# Patient Record
Sex: Female | Born: 1960 | Race: White | Hispanic: No | Smoking: Former smoker
Health system: Southern US, Community
[De-identification: ages and names within clinical notes are randomized; demographics above are authoritative.]

## PROBLEM LIST (undated history)

## (undated) DIAGNOSIS — Z8669 Personal history of other diseases of the nervous system and sense organs: Secondary | ICD-10-CM

## (undated) DIAGNOSIS — I10 Essential (primary) hypertension: Secondary | ICD-10-CM

## (undated) DIAGNOSIS — E559 Vitamin D deficiency, unspecified: Secondary | ICD-10-CM

## (undated) DIAGNOSIS — J4520 Mild intermittent asthma, uncomplicated: Secondary | ICD-10-CM

## (undated) DIAGNOSIS — K219 Gastro-esophageal reflux disease without esophagitis: Secondary | ICD-10-CM

## (undated) DIAGNOSIS — D649 Anemia, unspecified: Secondary | ICD-10-CM

## (undated) DIAGNOSIS — E785 Hyperlipidemia, unspecified: Secondary | ICD-10-CM

## (undated) HISTORY — DX: Personal history of other diseases of the nervous system and sense organs: Z86.69

## (undated) HISTORY — DX: Mild intermittent asthma, uncomplicated: J45.20

## (undated) HISTORY — DX: Vitamin D deficiency, unspecified: E55.9

## (undated) HISTORY — DX: Anemia, unspecified: D64.9

## (undated) HISTORY — DX: Hyperlipidemia, unspecified: E78.5

## (undated) HISTORY — PX: TUBAL LIGATION: SHX77

## (undated) HISTORY — PX: REDUCTION MAMMAPLASTY: SUR839

## (undated) HISTORY — DX: Gastro-esophageal reflux disease without esophagitis: K21.9

## (undated) HISTORY — PX: BREAST SURGERY: SHX581

---

## 2000-02-15 ENCOUNTER — Other Ambulatory Visit: Admission: RE | Admit: 2000-02-15 | Discharge: 2000-02-15 | Payer: Self-pay | Admitting: *Deleted

## 2001-06-15 ENCOUNTER — Other Ambulatory Visit: Admission: RE | Admit: 2001-06-15 | Discharge: 2001-06-15 | Payer: Self-pay | Admitting: Obstetrics & Gynecology

## 2001-11-14 ENCOUNTER — Ambulatory Visit (HOSPITAL_COMMUNITY): Admission: RE | Admit: 2001-11-14 | Discharge: 2001-11-14 | Payer: Self-pay | Admitting: Obstetrics and Gynecology

## 2001-11-14 ENCOUNTER — Encounter: Payer: Self-pay | Admitting: Obstetrics and Gynecology

## 2001-11-16 ENCOUNTER — Encounter: Admission: RE | Admit: 2001-11-16 | Discharge: 2001-11-16 | Payer: Self-pay | Admitting: Obstetrics and Gynecology

## 2002-01-22 ENCOUNTER — Inpatient Hospital Stay (HOSPITAL_COMMUNITY): Admission: AD | Admit: 2002-01-22 | Discharge: 2002-01-25 | Payer: Self-pay | Admitting: Obstetrics & Gynecology

## 2002-03-27 ENCOUNTER — Ambulatory Visit (HOSPITAL_COMMUNITY): Admission: RE | Admit: 2002-03-27 | Discharge: 2002-03-27 | Payer: Self-pay | Admitting: Obstetrics and Gynecology

## 2004-04-19 ENCOUNTER — Other Ambulatory Visit: Admission: RE | Admit: 2004-04-19 | Discharge: 2004-04-19 | Payer: Self-pay | Admitting: Internal Medicine

## 2004-04-23 ENCOUNTER — Ambulatory Visit (HOSPITAL_COMMUNITY): Admission: RE | Admit: 2004-04-23 | Discharge: 2004-04-23 | Payer: Self-pay | Admitting: Internal Medicine

## 2004-12-11 ENCOUNTER — Emergency Department (HOSPITAL_COMMUNITY): Admission: EM | Admit: 2004-12-11 | Discharge: 2004-12-11 | Payer: Self-pay | Admitting: Family Medicine

## 2004-12-28 ENCOUNTER — Emergency Department (HOSPITAL_COMMUNITY): Admission: EM | Admit: 2004-12-28 | Discharge: 2004-12-28 | Payer: Self-pay | Admitting: Family Medicine

## 2005-05-12 ENCOUNTER — Ambulatory Visit (HOSPITAL_COMMUNITY): Admission: RE | Admit: 2005-05-12 | Discharge: 2005-05-12 | Payer: Self-pay | Admitting: Internal Medicine

## 2005-09-29 ENCOUNTER — Encounter (INDEPENDENT_AMBULATORY_CARE_PROVIDER_SITE_OTHER): Payer: Self-pay | Admitting: Specialist

## 2005-09-29 ENCOUNTER — Ambulatory Visit (HOSPITAL_BASED_OUTPATIENT_CLINIC_OR_DEPARTMENT_OTHER): Admission: RE | Admit: 2005-09-29 | Discharge: 2005-09-29 | Payer: Self-pay | Admitting: *Deleted

## 2005-09-29 ENCOUNTER — Ambulatory Visit (HOSPITAL_COMMUNITY): Admission: RE | Admit: 2005-09-29 | Discharge: 2005-09-29 | Payer: Self-pay | Admitting: *Deleted

## 2006-05-02 ENCOUNTER — Other Ambulatory Visit: Admission: RE | Admit: 2006-05-02 | Discharge: 2006-05-02 | Payer: Self-pay | Admitting: Family Medicine

## 2006-05-18 ENCOUNTER — Ambulatory Visit (HOSPITAL_COMMUNITY): Admission: RE | Admit: 2006-05-18 | Discharge: 2006-05-18 | Payer: Self-pay | Admitting: Internal Medicine

## 2007-05-29 ENCOUNTER — Ambulatory Visit (HOSPITAL_COMMUNITY): Admission: RE | Admit: 2007-05-29 | Discharge: 2007-05-29 | Payer: Self-pay | Admitting: Internal Medicine

## 2007-05-29 LAB — HM MAMMOGRAPHY: HM Mammogram: NORMAL

## 2011-03-18 NOTE — H&P (Signed)
NAMECARNELL, Rebecca Ochoa                ACCOUNT NO.:  [id number]   MEDICAL RECORD NO.:  [id number]          PATIENT TYPE:  AMB   LOCATION:  [facility]                         FACILITY:  [facility]   PHYSICIAN:  Lyman Speller, MD       DATE OF BIRTH:  [birth year]   DATE OF ADMISSION:  09/29/2005  DATE OF DISCHARGE:                                HISTORY & PHYSICAL   CHIEF COMPLAINT:  Symptomatic macromastia.   HISTORY OF PRESENT ILLNESS:  This is a 50 year old female who states that  she presently wears a 38 DDD bra.  Her physical examination is consistent  with symptomatic macromastia.  She underwent mammography in 2005, which was  normal by her report.  She denies any history of breast problems, and has a  negative family history for breast carcinoma.   PAST MEDICAL HISTORY:  Significant for smoking 1-1/2 packs of cigarettes per  day over the past 25 years.  She denies any chronic lung problems.   PAST SURGICAL HISTORY:  Bilateral tubal ligation in 2003.   CURRENT MEDICATIONS:  None.   MEDICAL ALLERGIES:  None.   SOCIAL HISTORY:  The patient does smoke 1-1/2 packs of cigarettes per day.  She denies excessive use of alcohol.   PHYSICAL EXAMINATION:  GENERAL:  This is a well-developed, well-nourished,  white female in no apparent distress.  HEENT:  Unremarkable.  NECK:  Supple and nontender without masses, JVDs, or bruits.  LUNGS:  Clear to auscultation bilaterally.  HEART:  Regular rate and rhythm without gallops, rubs or murmurs.  BREAST EXAMINATION:  The breasts are a DDD cup size.  The left breast is  significantly larger than the right.  There are no distress masses, though  she does have some diffuse fibrocystic change bilaterally.  The axillae are  benign bilaterally.  There are no nipple discharges, and the overlying  breast skin appears normal.  There are no breast scars identified.  Sternal  notch to nipple measurement is 37 cm on the left and 27 cm on the right.  The inner nipple  distance is 25 cm.  Examination of the shoulders reveals  deep bilateral shoulder grooving with deformation to the clavicle  bilaterally.  The patient also has an accentuated kyphosis, but no evidence  of scoliosis.  Examination beneath the breast reveals changes consistent  with healed intertrigo.  There is no active infection at present.  ABDOMEN:  Benign.  EXTREMITIES:  Unremarkable.  BACK:  Without CVA or flank tenderness.  NEUROLOGIC:  Grossly intact.   IMPRESSION:  1.  Bilateral symptomatic macromastia with breast asymmetry.  2.  Long-standing history of tobacco use.   PLAN:  The patient will undergo bilateral reduction mammoplasty.  This will  be done by a modified Weiss pattern.  I have counseled the patient as to the  risks of surgery to include infection, bleeding, seroma and hematoma.  I  have also counseled regarding persistent breast asymmetry.  I have spent a  great deal of time counseling her in regards to her smoking history, that  she has a significantly increased  risk of wound healing problems, as well as  skin flap or nipple necrosis.  She voices understanding of these risks and  does wish to proceed as planned.  Surgery will be performed at [facility].           ______________________________  Lyman Speller, MD     CWB/MEDQ  D:  09/28/2005  T:  09/28/2005  Job:  [id number]

## 2011-03-18 NOTE — Op Note (Signed)
[facility]  Patient:    Rebecca Ochoa, Rebecca Ochoa Visit Number: [id number] MRN: [id number]          Service Type: DSU Location: [facility] Attending Physician:  Osborn Coho Dictated by:   Janeece Riggers Dareen Piano, M.D. Proc. Date: 03/27/02 Admit Date:  03/27/2002                             Operative Report  PREOPERATIVE DIAGNOSES:       Patient desires permanent sterilization.  POSTOPERATIVE DIAGNOSES:      Patient desires permanent sterilization.  PROCEDURE:                    Laparoscopic bilateral tubal ligation, application of Hulka clips.  SURGEON:                      Mark E. Dareen Piano, M.D.  ANESTHESIA:                   General.  ANTIBIOTICS:                  Ancef 1 g.  ESTIMATED BLOOD LOSS:         Minimal.  SPECIMEN:                     None.  COMPLICATIONS:                None.  FINDINGS:                     Patient had a normal appearing liver and gallbladder.  The fallopian tubes, ovaries, and uterus were normal.  There was no evidence of adhesions or pelvic endometriosis.  The appendix was not visualized.  PROCEDURE:                    Patient was taken to the operating room where a general anesthetic was administered without complications.  She was then placed in the dorsal lithotomy position and prepped with Hibiclens.  Her bladder was drained with a Red rubber catheter.  A Hulka tenaculum was applied to the anterior cervical lip.  The patient was then draped in the usual fashion for this procedure.  Lidocaine 10 cc 1% was placed in the umbilicus. A vertical skin incision was made in the umbilicus.  Veress needle placed in the peritoneal cavity and 3 L of carbon dioxide was insufflated.  A 12 mm trocar was then placed in the peritoneal cavity.  The laparoscope was then placed.  The abdominal and pelvic contents were then examined and findings were as noted above.  At this point the Hulka clip was placed on the right fallopian tube  in the isthmic portion.  The entire tube appeared to be within the clasp.  The clasp was placed perpendicular to the tube.  The clasp was tightly closed.  A similar procedure was performed on the opposite side.  At this point the procedure was concluded.  The instruments were removed, the pneumoperitoneum released, and the fascia closed with interrupted 0 Vicryl suture.  The skin was closed with interrupted 4-0 Vicryl suture.  The patient tolerated procedure well and she will be discharged to home.  She will be sent home with Tylox to take p.r.n.  She will follow up in the office in four weeks. Dictated by:   Janeece Riggers Dareen Piano, M.D. Attending  Physician:  Osborn Coho DD:  03/27/02 TD:  03/28/02 Job: [id number]

## 2011-03-18 NOTE — Discharge Summary (Signed)
[facility]  Patient:    SETAREH, ROM Visit Number: [id number] MRN: [id number]          Service Type: OBS Location: 910A 9101 01 Attending Physician:  Osborn Coho Dictated by:   Gerrit Friends. Aldona Bar, M.D. Admit Date:  01/22/2002 Discharge Date: 01/25/2002                             Discharge Summary  DISCHARGE DIAGNOSES:          1. Term pregnancy, delivered 5 pound 14 ounce                                  female infant, Apgars 8 and 9.                               2. Blood type B positive.                               3. Advanced maternal age.  PROCEDURES:                   1. Normal spontaneous vaginal delivery.                               2. Second degree tear and repair.                               3. Epidural anesthesia.                               4. Fetal monitoring.  HOSPITAL COURSE:              This 50 year old gravida 4, para 1, was admitted at 37+ weeks after ruptured membranes.  She did have an amniocentesis at 15-[redacted] weeks gestation, which was normal.  She also had gestational diabetes, which was well-controlled with diet.  At the time of admission her cervix was 1 cm dilated, 50% effaced, with the vertex at -2 station, with obvious amniotomy.  She had good progression after Pitocin augmentation and subsequently had a normal spontaneous vaginal delivery of a viable 5 pound 14 ounce female infant with Apgars of 8 and 9 over a second degree tear.  The tear was repaired without difficulty, and the patients postpartum course was uncomplicated.  Her initial platelet count on admission was 151,000, and that fell to 118,000 on March 27, but on the morning of March 28 had risen to 129,000.  Discharge hemoglobin was 10.9 with a white count of 8400.  She was bottle-feeding at the time of discharge.  At the time of discharge her vital signs were stable, she was ambulating without difficulty, having normal bowel and bladder  function, and was tolerating her diet well.  She was ready for discharge.  Accordingly, on the morning of March 28 she was given all specific instructions and understood all instructions well.  DISCHARGE MEDICATIONS:        1. Vitamins one a day.  She will finish her  prenatal vitamins.                               2. In addition, she will take ferrous sulfate                                  300 mg two or three times a week to raise                                  her slightly depressed hemoglobin.  FOLLOW-UP:                    She will return to the office for follow-up in approximately four weeks time or as needed.  CONDITION ON DISCHARGE:       Improved. Dictated by:   Gerrit Friends. Aldona Bar, M.D. Attending Physician:  Osborn Coho DD:  01/25/02 TD:  01/26/02 Job: [id number]

## 2011-03-18 NOTE — Op Note (Signed)
NAMECASEE, KNEPP                ACCOUNT NO.:  [id number]   MEDICAL RECORD NO.:  [id number]          PATIENT TYPE:  AMB   LOCATION:  [facility]                         FACILITY:  [facility]   PHYSICIAN:  Lyman Speller, MD       DATE OF BIRTH:  [birth year]   DATE OF PROCEDURE:  09/29/2005  DATE OF DISCHARGE:                                 OPERATIVE REPORT   SURGEON:  Lyman Speller, M.D.   FIRST ASSISTANT:  Alethia Berthold   PREOPERATIVE DIAGNOSES:  1.  Symptomatic macromastia.  2.  Breast asymmetry.   POSTOPERATIVE DIAGNOSES:  1.  Symptomatic macromastia.  2.  Breast asymmetry.   OPERATION PERFORMED:  Bilateral reduction mammoplasty.   ANESTHESIA:  General endotracheal.   DESCRIPTION OF OPERATION:  The patient was seen in the preoperative holding  area, at which time the preoperative markings were placed.  The risks of the  surgery were once again reviewed.  The patient voiced understanding of these  risks and did wish to proceed with the surgery as planned.  She was then  taken to the operating room and placed on the table in supine position.  After the induction of adequate general endotracheal anesthesia, the chest  was prepped and draped in the usual sterile fashion.  The nipple-areolar  complexes were marked at 42 mm.  The breast pedicle was marked at 8 cm.  The  inframammary incision was then infiltrated with 0.5% Xylocaine with  epinephrine for hemostatic purposes.  A #10 blade was then used to incise  the nipple-areolar complex and the pedicle.  The pedicle was then de-  epithelialized sharply using the Epi-Cut device.  At this point the  remaining breast incisions were made using a #10 blade based on the modified  Weiss pattern markings, which had been placed in the preoperative holding  area.  The medial and lateral breast incisions were then extended to the  level of the anterior chest wall and using the Bovie electrocautery.  The  superior aspect of the pedicle was then  released from the upper breast flap,  again using the Bovie electrocautery.  The superior breast flap was then  elevated superiorly.  This was continued in a prepectoral fashion to the  level of the clavicle superiorly and to the level of the anterior border of  the muscle laterally.  At this point all visible bleeding was controlled  with the Bovie electrocautery __________ achieved both medially and  laterally from the pedicle.  At this point attention was turned to the upper  flap.  The flap was thinned to an overall thickness of approximately 2 cm  throughout.  It is to be noted that significant amount of fibrous, dense-  appearing breast tissue was removed.  There were no discrete masses or  suspicious areas noted within the specimens.  The left breast underwent  excision of __________ and was complete.  The breasts were thoroughly  irrigated with normal saline, and again all visible bleeding was controlled  with the Bovie electrocautery.  The corners of the superior breast flaps  were  then reapproximated in the midline of the inframammary incision using a  single buried interrupted suture of 3-0 Monocryl.  At this point the Insorb  subcutaneous stapler was used to reapproximate the deep dermal aspect of  both the vertical and the horizontal breast incisions.  Skin edge  approximation was then obtained using a running subcuticular 4-0 Monocryl  suture.  The 38 mm cookie cutter was then used to delineate the limits of  skin excision for nipple placement.  A #10 blade was used to incise this  skin and __________ to its new position and secured in the cardinal points  using buried interrupted sutures of 3-0 Monocryl placed in the deep dermal  plane.  Skin edge approximation of the nipple-areolar complex was  accomplished with multiple buried, interrupted subcuticular sutures of 4-0  Monocryl.  At this point the breasts were thoroughly cleansed and all  closures were further reinforced  utilizing half-inch Steri-Strips secured  with Benzoin.  At the close of the procedure the skin flaps as well as the  nipples were pink and viable.  A bulky, moderately compressive __________  and will be seen in the office tomorrow for postoperative follow-up.           ______________________________  Lyman Speller, MD     CWB/MEDQ  D:  09/29/2005  T:  09/30/2005  Job:  [id number]

## 2013-10-15 ENCOUNTER — Other Ambulatory Visit: Payer: Self-pay | Admitting: Emergency Medicine

## 2013-10-15 ENCOUNTER — Encounter: Payer: Self-pay | Admitting: Internal Medicine

## 2013-10-15 DIAGNOSIS — E559 Vitamin D deficiency, unspecified: Secondary | ICD-10-CM | POA: Insufficient documentation

## 2013-10-15 DIAGNOSIS — D649 Anemia, unspecified: Secondary | ICD-10-CM | POA: Insufficient documentation

## 2013-10-15 DIAGNOSIS — E785 Hyperlipidemia, unspecified: Secondary | ICD-10-CM | POA: Insufficient documentation

## 2013-10-15 DIAGNOSIS — D638 Anemia in other chronic diseases classified elsewhere: Secondary | ICD-10-CM | POA: Insufficient documentation

## 2013-10-15 MED ORDER — PREDNISONE 10 MG PO TABS: ORAL_TABLET | ORAL | Status: DC

## 2013-10-16 ENCOUNTER — Ambulatory Visit (INDEPENDENT_AMBULATORY_CARE_PROVIDER_SITE_OTHER): Payer: BC Managed Care – PPO | Admitting: Emergency Medicine

## 2013-10-16 ENCOUNTER — Encounter: Payer: Self-pay | Admitting: Emergency Medicine

## 2013-10-16 VITALS — BP 118/70 | HR 98 | Temp 98.6°F | Resp 18 | Ht 65.25 in | Wt 151.0 lb

## 2013-10-16 DIAGNOSIS — I10 Essential (primary) hypertension: Secondary | ICD-10-CM

## 2013-10-16 DIAGNOSIS — R259 Unspecified abnormal involuntary movements: Secondary | ICD-10-CM

## 2013-10-16 DIAGNOSIS — E782 Mixed hyperlipidemia: Secondary | ICD-10-CM

## 2013-10-16 DIAGNOSIS — J209 Acute bronchitis, unspecified: Secondary | ICD-10-CM

## 2013-10-16 DIAGNOSIS — J309 Allergic rhinitis, unspecified: Secondary | ICD-10-CM

## 2013-10-16 DIAGNOSIS — M6281 Muscle weakness (generalized): Secondary | ICD-10-CM

## 2013-10-16 DIAGNOSIS — R5381 Other malaise: Secondary | ICD-10-CM

## 2013-10-16 DIAGNOSIS — Z23 Encounter for immunization: Secondary | ICD-10-CM

## 2013-10-16 LAB — LIPID PANEL
Cholesterol: 206 mg/dL — ABNORMAL HIGH (ref 0–200)
HDL: 44 mg/dL (ref >39)
LDL Cholesterol: 133 mg/dL — ABNORMAL HIGH (ref 0–99)
Total CHOL/HDL Ratio: 4.7 Ratio
Triglycerides: 145 mg/dL (ref <150)
VLDL: 29 mg/dL (ref 0–40)

## 2013-10-16 LAB — BASIC METABOLIC PANEL WITH GFR
BUN: 6 mg/dL (ref 6–23)
CO2: 29 mEq/L (ref 19–32)
Calcium: 10.1 mg/dL (ref 8.4–10.5)
Chloride: 103 mEq/L (ref 96–112)
Creat: 0.73 mg/dL (ref 0.50–1.10)
GFR, Est African American: >89 mL/min
GFR, Est Non African American: >89 mL/min
Glucose, Bld: 109 mg/dL — ABNORMAL HIGH (ref 70–99)
Potassium: 4.6 mEq/L (ref 3.5–5.3)
Sodium: 140 mEq/L (ref 135–145)

## 2013-10-16 LAB — HEPATIC FUNCTION PANEL
ALT: 13 U/L (ref 0–35)
AST: 16 U/L (ref 0–37)
Albumin: 4.6 g/dL (ref 3.5–5.2)
Alkaline Phosphatase: 86 U/L (ref 39–117)
Bilirubin, Direct: 0.1 mg/dL (ref 0.0–0.3)
Indirect Bilirubin: 0.2 mg/dL (ref 0.0–0.9)
Total Bilirubin: 0.3 mg/dL (ref 0.3–1.2)
Total Protein: 7.7 g/dL (ref 6.0–8.3)

## 2013-10-16 LAB — CBC WITH DIFFERENTIAL/PLATELET
Basophils Absolute: 0 10*3/uL (ref 0.0–0.1)
Basophils Relative: 0 % (ref 0–1)
Eosinophils Absolute: 0 10*3/uL (ref 0.0–0.7)
Eosinophils Relative: 0 % (ref 0–5)
HCT: 38.3 % (ref 36.0–46.0)
Hemoglobin: 13.2 g/dL (ref 12.0–15.0)
Lymphocytes Relative: 8 % — ABNORMAL LOW (ref 12–46)
Lymphs Abs: 1.2 10*3/uL (ref 0.7–4.0)
MCH: 31.7 pg (ref 26.0–34.0)
MCHC: 34.5 g/dL (ref 30.0–36.0)
MCV: 92.1 fL (ref 78.0–100.0)
Monocytes Absolute: 0.8 10*3/uL (ref 0.1–1.0)
Monocytes Relative: 5 % (ref 3–12)
Neutro Abs: 12.7 10*3/uL — ABNORMAL HIGH (ref 1.7–7.7)
Neutrophils Relative %: 87 % — ABNORMAL HIGH (ref 43–77)
Platelets: 282 10*3/uL (ref 150–400)
RBC: 4.16 MIL/uL (ref 3.87–5.11)
RDW: 13.5 % (ref 11.5–15.5)
WBC: 14.7 10*3/uL — ABNORMAL HIGH (ref 4.0–10.5)

## 2013-10-16 MED ORDER — ALBUTEROL SULFATE HFA 108 (90 BASE) MCG/ACT IN AERS: 2.0000 | INHALATION_SPRAY | Freq: Four times a day (QID) | RESPIRATORY_TRACT | Status: DC | PRN

## 2013-10-16 MED ORDER — BENZONATATE 100 MG PO CAPS: 100.0000 mg | ORAL_CAPSULE | Freq: Three times a day (TID) | ORAL | Status: DC | PRN

## 2013-10-16 MED ORDER — ALPRAZOLAM 1 MG PO TABS: 1.0000 mg | ORAL_TABLET | Freq: Three times a day (TID) | ORAL | Status: DC | PRN

## 2013-10-16 MED ORDER — AZITHROMYCIN 250 MG PO TABS: ORAL_TABLET | ORAL | Status: AC

## 2013-10-16 NOTE — Patient Instructions (Signed)
Allergic Rhinitis Allergic rhinitis is when the mucous membranes in the nose respond to allergens. Allergens are particles in the air that cause your body to have an allergic reaction. This causes you to release allergic antibodies. Through a chain of events, these eventually cause you to release histamine into the blood stream (hence the use of antihistamines). Although meant to be protective to the body, it is this release that causes your discomfort, such as frequent sneezing, congestion and an itchy runny nose.  CAUSES  The pollen allergens may come from grasses, trees, and weeds. This is seasonal allergic rhinitis, or "hay fever." Other allergens cause year-round allergic rhinitis (perennial allergic rhinitis) such as house dust mite allergen, pet dander and mold spores.  SYMPTOMS   Nasal stuffiness (congestion).  Runny, itchy nose with sneezing and tearing of the eyes.  There is often an itching of the mouth, eyes and ears. It cannot be cured, but it can be controlled with medications. DIAGNOSIS  If you are unable to determine the offending allergen, skin or blood testing may find it. TREATMENT   Avoid the allergen.  Medications and allergy shots (immunotherapy) can help.  Hay fever may often be treated with antihistamines in pill or nasal spray forms. Antihistamines block the effects of histamine. There are over-the-counter medicines that may help with nasal congestion and swelling around the eyes. Check with your caregiver before taking or giving this medicine. If the treatment above does not work, there are many new medications your caregiver can prescribe. Stronger medications may be used if initial measures are ineffective. Desensitizing injections can be used if medications and avoidance fails. Desensitization is when a patient is given ongoing shots until the body becomes less sensitive to the allergen. Make sure you follow up with your caregiver if problems continue. SEEK MEDICAL  CARE IF:   You develop fever (more than 100.5 F (38.1 C).  You develop a cough that does not stop easily (persistent).  You have shortness of breath.  You start wheezing.  Symptoms interfere with normal daily activities. Document Released: 07/12/2001 Document Revised: 01/09/2012 Document Reviewed: 01/21/2009 Exit[employer] Patient Information 2014 [employer]. Bronchitis Bronchitis is a problem of the air tubes leading to your lungs. This problem makes it hard for air to get in and out of the lungs. You may cough a lot because your air tubes are narrow. Going without care can cause lasting (chronic) bronchitis. HOME CARE   Drink enough fluids to keep your pee (urine) clear or pale yellow.  Use a cool mist humidifier.  Quit smoking if you smoke. If you keep smoking, the bronchitis might not get better.  Only take medicine as told by your doctor. GET HELP RIGHT AWAY IF:   Coughing keeps you awake.  You start to wheeze.  You become more sick or weak.  You have a hard time breathing or get short of breath.  You cough up blood.  Coughing lasts more than 2 weeks.  You have a fever.  Your baby is older than 3 months with a rectal temperature of 102 F (38.9 C) or higher.  Your baby is 3 months old or younger with a rectal temperature of 100.4 F (38 C) or higher. MAKE SURE YOU:  Understand these instructions.  Will watch your condition.  Will get help right away if you are not doing well or get worse. Document Released: 04/04/2008 Document Revised: 01/09/2012 Document Reviewed: 06/11/2013 Exit[employer] Patient Information 2014 [employer].  

## 2013-10-17 ENCOUNTER — Telehealth: Payer: Self-pay | Admitting: *Deleted

## 2013-10-17 LAB — TSH: TSH: 0.266 u[IU]/mL — ABNORMAL LOW (ref 0.350–4.500)

## 2013-10-17 NOTE — Telephone Encounter (Signed)
Spoke with patient about lab results and instructions. 

## 2013-10-17 NOTE — Telephone Encounter (Signed)
Message copied by Nicholaus Corolla A on Thu Oct 17, 2013  9:56 AM ------      Message from: [place], [place] R      Created: Thu Oct 17, 2013  9:02 AM       Cholesterol, BS elevated need better/ healthier diet, cardio and weight loss. CBC with infection continue ABX AD. TSH not in range but could be from infection. Recheck CBC TSH 1 month Lab only. Continue vitamins and prescriptions same.       ------

## 2013-10-18 NOTE — Progress Notes (Signed)
   Subjective:    Patient ID: Rebecca Ochoa, female    DOB: October 19, 1961, 52 y.o.   MRN: [id number]  HPI Comments: 52 yo female presents with increased cough over last 3 days and now with yellow production. She notes allergy drainage over 1 week. Nasal production is clear. She has not tried OTC.  She also notes she has not had cholesterol recheck done. She has been trying to eat better. LAST LABS T 201 TG 217 LDL 118  She has mild fatigue even before recent illness. She notes fatigue improves with rest. Denies any triggers or changes to increase sx.  Cough Associated symptoms include postnasal drip.    Current Outpatient Prescriptions on File Prior to Visit  Medication Sig Dispense Refill  . aspirin 81 MG chewable tablet Chew by mouth daily.      . Cholecalciferol (VITAMIN D PO) Take 2,000 Int'l Units by mouth daily.      . predniSONE (DELTASONE) 10 MG tablet 1 po tid x 3 day, 1 po bid x 3 day, 1 po qd x 5days  20 tablet  0   No current facility-administered medications on file prior to visit.   ALLERGIES Biaxin  Past Medical History  Diagnosis Date  . Hyperlipidemia   . Anemia   . Vitamin D deficiency      Review of Systems  Constitutional: Positive for fatigue.  HENT: Positive for postnasal drip.   Respiratory: Positive for cough.    BP 118/70  Pulse 98  Temp(Src) 98.6 F (37 C) (Temporal)  Resp 18  Ht 5' 5.25" (1.657 m)  Wt 151 lb (68.493 kg)  BMI 24.95 kg/m2     Objective:   Physical Exam  Nursing note and vitals reviewed. Constitutional: She is oriented to person, place, and time. She appears well-developed and well-nourished. No distress.  HENT:  Head: Normocephalic and atraumatic.  Right Ear: External ear normal.  Left Ear: External ear normal.  Nose: Nose normal.  Mouth/Throat: Oropharynx is clear and moist. No oropharyngeal exudate.  Cloudy TMs  Eyes: Conjunctivae and EOM are normal.  Neck: Normal range of motion. Neck supple. No JVD present. No  thyromegaly present.  Cardiovascular: Normal rate, regular rhythm, normal heart sounds and intact distal pulses.   Pulmonary/Chest: Effort normal and breath sounds normal.  Congested dry cough, occasional wheeze clears with cough  Abdominal: Soft. Bowel sounds are normal. She exhibits no distension and no mass. There is no tenderness. There is no rebound and no guarding.  Musculoskeletal: Normal range of motion. She exhibits no edema and no tenderness.  Lymphadenopathy:    She has no cervical adenopathy.  Neurological: She is alert and oriented to person, place, and time. No cranial nerve deficit.  Skin: Skin is warm and dry. No rash noted. No erythema. No pallor.  Psychiatric: She has a normal mood and affect. Her behavior is normal. Judgment and thought content normal.          Assessment & Plan:  1. Bronchitis/ Allergic rhinitis- Allegra OTC, increase H2o, allergy hygiene explained. Zpak if sxdo not improve. Try Pred 10 mg DP, Tessalon Perles 100 mg, and Albuterol HFA all AD. 2. Cholesterol- Check labs 3. Fatigue- check labs, increase activity and H2O

## 2013-11-19 ENCOUNTER — Other Ambulatory Visit: Payer: Self-pay | Admitting: Emergency Medicine

## 2013-11-19 DIAGNOSIS — R899 Unspecified abnormal finding in specimens from other organs, systems and tissues: Secondary | ICD-10-CM

## 2013-11-20 ENCOUNTER — Other Ambulatory Visit: Payer: Self-pay

## 2013-11-27 ENCOUNTER — Other Ambulatory Visit: Payer: Self-pay

## 2014-02-24 ENCOUNTER — Encounter: Payer: Self-pay | Admitting: Internal Medicine

## 2014-02-24 ENCOUNTER — Encounter: Payer: Self-pay | Admitting: Emergency Medicine

## 2014-02-24 DIAGNOSIS — Z0000 Encounter for general adult medical examination without abnormal findings: Secondary | ICD-10-CM

## 2014-03-26 ENCOUNTER — Encounter: Payer: Self-pay | Admitting: Physician Assistant

## 2014-03-26 ENCOUNTER — Ambulatory Visit (INDEPENDENT_AMBULATORY_CARE_PROVIDER_SITE_OTHER): Payer: Self-pay | Admitting: Physician Assistant

## 2014-03-26 VITALS — BP 138/70 | HR 88 | Temp 98.1°F | Resp 16 | Ht 65.0 in | Wt 153.0 lb

## 2014-03-26 DIAGNOSIS — F17200 Nicotine dependence, unspecified, uncomplicated: Secondary | ICD-10-CM

## 2014-03-26 DIAGNOSIS — J209 Acute bronchitis, unspecified: Secondary | ICD-10-CM

## 2014-03-26 MED ORDER — SULFAMETHOXAZOLE-TMP DS 800-160 MG PO TABS: 1.0000 | ORAL_TABLET | Freq: Two times a day (BID) | ORAL | Status: DC

## 2014-03-26 MED ORDER — PREDNISONE 10 MG PO TABS: ORAL_TABLET | ORAL | Status: DC

## 2014-03-26 NOTE — Patient Instructions (Addendum)
We are giving you chantix for smoking cessation. You can do it! And we are here to help! You may have heard some scary side effects about chantix, the three most common I hear about are nausea, crazy dreams and depression.  However, I like for my patients to try to stay on 1/2 a tablet twice a day rather than one tablet twice a day as normally prescribed. This helps decrease the chances of side effects and helps save money by making a one month prescription last two months  Please start the prescription this way:  Start 1/2 tablet by mouth once daily after food with a full glass of water for 3 days Then do 1/2 tablet by mouth twice daily for 4 days.  At this point we have several options: 1) continue on 1/2 tablet twice a day- which I encourage you to do. You can stay on this dose the rest of the time on the medication or if you still feel the need to smoke you can do one of the two options below. 2) do one tablet in the morning and 1/2 in the evening which helps decrease dreams. 3) do one tablet twice a day.   What if I miss a dose? If you miss a dose, take it as soon as you can. If it is almost time for your next dose, take only that dose. Do not take double or extra doses.  What should I watch for while using this medicine? Visit your doctor or health care professional for regular check ups. Ask for ongoing advice and encouragement from your doctor or healthcare professional, friends, and family to help you quit. If you smoke while on this medication, quit again  Your mouth may get dry. Chewing sugarless gum or hard candy, and drinking plenty of water may help. Contact your doctor if the problem does not go away or is severe.  You may get drowsy or dizzy. Do not drive, use machinery, or do anything that needs mental alertness until you know how this medicine affects you. Do not stand or sit up quickly, especially if you are an older patient.   The use of this medicine may increase the chance  of suicidal thoughts or actions. Pay special attention to how you are responding while on this medicine. Any worsening of mood, or thoughts of suicide or dying should be reported to your health care professional right away.  ADVANTAGES OF QUITTING SMOKING  Within 20 minutes, blood pressure decreases. Your pulse is at normal level.  After 8 hours, carbon monoxide levels in the blood return to normal. Your oxygen level increases.  After 24 hours, the chance of having a heart attack starts to decrease. Your breath, hair, and body stop smelling like smoke.  After 48 hours, damaged nerve endings begin to recover. Your sense of taste and smell improve.  After 72 hours, the body is virtually free of nicotine. Your bronchial tubes relax and breathing becomes easier.  After 2 to 12 weeks, lungs can hold more air. Exercise becomes easier and circulation improves.  After 1 year, the risk of coronary heart disease is cut in half.  After 5 years, the risk of stroke falls to the same as a nonsmoker.  After 10 years, the risk of lung cancer is cut in half and the risk of other cancers decreases significantly.  After 15 years, the risk of coronary heart disease drops, usually to the level of a nonsmoker.  You will have extra money   to spend on things other than cigarettes.  Bronchitis Bronchitis is inflammation of the airways that extend from the windpipe into the lungs (bronchi). The inflammation often causes mucus to develop, which leads to a cough. If the inflammation becomes severe, it may cause shortness of breath. CAUSES  Bronchitis may be caused by:   Viral infections.   Bacteria.   Cigarette smoke.   Allergens, pollutants, and other irritants.  SIGNS AND SYMPTOMS  The most common symptom of bronchitis is a frequent cough that produces mucus. Other symptoms include:  Fever.   Body aches.   Chest congestion.   Chills.   Shortness of breath.   Sore throat.  DIAGNOSIS   Bronchitis is usually diagnosed through a medical history and physical exam. Tests, such as chest X-rays, are sometimes done to rule out other conditions.  TREATMENT  You may need to avoid contact with whatever caused the problem (smoking, for example). Medicines are sometimes needed. These may include:  Antibiotics. These may be prescribed if the condition is caused by bacteria.  Cough suppressants. These may be prescribed for relief of cough symptoms.   Inhaled medicines. These may be prescribed to help open your airways and make it easier for you to breathe.   Steroid medicines. These may be prescribed for those with recurrent (chronic) bronchitis. HOME CARE INSTRUCTIONS  Get plenty of rest.   Drink enough fluids to keep your urine clear or pale yellow (unless you have a medical condition that requires fluid restriction). Increasing fluids may help thin your secretions and will prevent dehydration.   Only take over-the-counter or prescription medicines as directed by your health care provider.  Only take antibiotics as directed. Make sure you finish them even if you start to feel better.  Avoid secondhand smoke, irritating chemicals, and strong fumes. These will make bronchitis worse. If you are a smoker, quit smoking. Consider using nicotine gum or skin patches to help control withdrawal symptoms. Quitting smoking will help your lungs heal faster.   Put a cool-mist humidifier in your bedroom at night to moisten the air. This may help loosen mucus. Change the water in the humidifier daily. You can also run the hot water in your shower and sit in the bathroom with the door closed for 5 10 minutes.   Follow up with your health care provider as directed.   Wash your hands frequently to avoid catching bronchitis again or spreading an infection to others.  SEEK MEDICAL CARE IF: Your symptoms do not improve after 1 week of treatment.  SEEK IMMEDIATE MEDICAL CARE IF:  Your fever  increases.  You have chills.   You have chest pain.   You have worsening shortness of breath.   You have bloody sputum.  You faint.  You have lightheadedness.  You have a severe headache.   You vomit repeatedly. MAKE SURE YOU:   Understand these instructions.  Will watch your condition.  Will get help right away if you are not doing well or get worse. Document Released: 10/17/2005 Document Revised: 08/07/2013 Document Reviewed: 06/11/2013 [facility] Patient Information 2014 [place], [place].

## 2014-03-26 NOTE — Progress Notes (Signed)
   Subjective:    Patient ID: Rebecca Ochoa, female    DOB: 1961/08/15, 53 y.o.   MRN: [id number]  Cough This is a new problem. Episode onset: 2 days. The cough is non-productive. Associated symptoms include nasal congestion, shortness of breath and wheezing. Pertinent negatives include no chest pain, chills, ear congestion, ear pain, fever, headaches, heartburn, hemoptysis, myalgias, postnasal drip, rash, rhinorrhea, sore throat, sweats or weight loss. Risk factors for lung disease include smoking/tobacco exposure. She has tried nothing for the symptoms. Her past medical history is significant for COPD.      Review of Systems  Constitutional: Negative.  Negative for fever, chills and weight loss.  HENT: Negative for ear pain, postnasal drip, rhinorrhea and sore throat.   Respiratory: Positive for cough, shortness of breath and wheezing. Negative for hemoptysis.   Cardiovascular: Negative.  Negative for chest pain.  Gastrointestinal: Negative for heartburn.  Musculoskeletal: Negative for myalgias.  Skin: Negative for rash.  Neurological: Negative for headaches.       Objective:   Physical Exam  Constitutional: She is oriented to person, place, and time. She appears well-developed and well-nourished.  HENT:  Head: Normocephalic and atraumatic.  Right Ear: External ear normal.  Left Ear: External ear normal.  Nose: Nose normal.  Mouth/Throat: Oropharynx is clear and moist.  Eyes: Conjunctivae are normal. Pupils are equal, round, and reactive to light.  Neck: Normal range of motion. Neck supple.  Cardiovascular: Normal rate and regular rhythm.   Pulmonary/Chest: Effort normal. No respiratory distress. She has wheezes. She has no rales. She exhibits no tenderness.  Abdominal: Soft. Bowel sounds are normal.  Lymphadenopathy:    She has no cervical adenopathy.  Neurological: She is alert and oriented to person, place, and time.  Skin: Skin is warm and dry.      Assessment & Plan:   COPD exacerbation/Bronchitis- Prednisone 10mg  , bactrim DS Smoking cessation- given chantix samples and coupon

## 2014-06-03 ENCOUNTER — Other Ambulatory Visit: Payer: Self-pay | Admitting: Emergency Medicine

## 2014-06-04 ENCOUNTER — Other Ambulatory Visit: Payer: Self-pay | Admitting: Physician Assistant

## 2014-10-20 ENCOUNTER — Other Ambulatory Visit: Payer: Self-pay | Admitting: Emergency Medicine

## 2014-12-08 ENCOUNTER — Ambulatory Visit (INDEPENDENT_AMBULATORY_CARE_PROVIDER_SITE_OTHER): Payer: BLUE CROSS/BLUE SHIELD | Admitting: Emergency Medicine

## 2014-12-08 ENCOUNTER — Encounter: Payer: Self-pay | Admitting: Emergency Medicine

## 2014-12-08 VITALS — BP 124/70 | HR 76 | Temp 98.2°F | Resp 16 | Ht 65.25 in | Wt 161.0 lb

## 2014-12-08 DIAGNOSIS — Z8669 Personal history of other diseases of the nervous system and sense organs: Secondary | ICD-10-CM | POA: Insufficient documentation

## 2014-12-08 DIAGNOSIS — E559 Vitamin D deficiency, unspecified: Secondary | ICD-10-CM

## 2014-12-08 DIAGNOSIS — E782 Mixed hyperlipidemia: Secondary | ICD-10-CM

## 2014-12-08 DIAGNOSIS — M25561 Pain in right knee: Secondary | ICD-10-CM

## 2014-12-08 DIAGNOSIS — R6889 Other general symptoms and signs: Secondary | ICD-10-CM

## 2014-12-08 DIAGNOSIS — Z0001 Encounter for general adult medical examination with abnormal findings: Secondary | ICD-10-CM

## 2014-12-08 DIAGNOSIS — Z111 Encounter for screening for respiratory tuberculosis: Secondary | ICD-10-CM

## 2014-12-08 DIAGNOSIS — F17200 Nicotine dependence, unspecified, uncomplicated: Secondary | ICD-10-CM

## 2014-12-08 DIAGNOSIS — Z91199 Patient's noncompliance with other medical treatment and regimen due to unspecified reason: Secondary | ICD-10-CM

## 2014-12-08 DIAGNOSIS — Z9119 Patient's noncompliance with other medical treatment and regimen: Secondary | ICD-10-CM

## 2014-12-08 DIAGNOSIS — I10 Essential (primary) hypertension: Secondary | ICD-10-CM

## 2014-12-08 DIAGNOSIS — Z79899 Other long term (current) drug therapy: Secondary | ICD-10-CM

## 2014-12-08 DIAGNOSIS — Z0000 Encounter for general adult medical examination without abnormal findings: Secondary | ICD-10-CM

## 2014-12-08 LAB — CBC WITH DIFFERENTIAL/PLATELET
Basophils Absolute: 0 10*3/uL (ref 0.0–0.1)
Basophils Relative: 0 % (ref 0–1)
Eosinophils Absolute: 0.1 10*3/uL (ref 0.0–0.7)
Eosinophils Relative: 1 % (ref 0–5)
HCT: 40.4 % (ref 36.0–46.0)
Hemoglobin: 13.6 g/dL (ref 12.0–15.0)
Lymphocytes Relative: 21 % (ref 12–46)
Lymphs Abs: 2.2 10*3/uL (ref 0.7–4.0)
MCH: 31.1 pg (ref 26.0–34.0)
MCHC: 33.7 g/dL (ref 30.0–36.0)
MCV: 92.4 fL (ref 78.0–100.0)
MPV: 11.4 fL (ref 8.6–12.4)
Monocytes Absolute: 0.4 10*3/uL (ref 0.1–1.0)
Monocytes Relative: 4 % (ref 3–12)
Neutro Abs: 7.7 10*3/uL (ref 1.7–7.7)
Neutrophils Relative %: 74 % (ref 43–77)
Platelets: 259 10*3/uL (ref 150–400)
RBC: 4.37 MIL/uL (ref 3.87–5.11)
RDW: 13.3 % (ref 11.5–15.5)
WBC: 10.4 10*3/uL (ref 4.0–10.5)

## 2014-12-08 NOTE — Patient Instructions (Signed)
Fat and Cholesterol Control Diet Your diet has an affect on your fat and cholesterol levels in your blood and organs. Too much fat and cholesterol in your blood can affect your:  Heart.  Blood vessels (arteries, veins).  Gallbladder.  Liver.  Pancreas. CONTROL FAT AND CHOLESTEROL WITH DIET Certain foods raise cholesterol and others lower it. It is important to replace bad fats with other types of fat.  Do not eat:  Fatty meats, such as hot dogs and salami.  Stick margarine and some tub margarines that have "partially hydrogenated oils" in them.  Baked goods, such as cookies and crackers that have "partially hydrogenated oils" in them.  Saturated tropical oils, such as coconut and palm oil. Eat the following foods:  Round or loin cuts of red meat.  Chicken (without skin).  Fish.  Veal.  Ground [place] breast.  Shellfish.  Fruit, such as apples.  Vegetables, such as broccoli, potatoes, and carrots.  Beans, peas, and lentils (legumes).  Grains, such as barley, rice, couscous, and bulgar wheat.  Pasta (without cream sauces). Look for foods that are nonfat, low in fat, and low in cholesterol.  FIND FOODS THAT ARE LOWER IN FAT AND CHOLESTEROL  Find foods with soluble fiber and plant sterols (phytosterol). You should eat 2 grams a day of these foods. These foods include:  Fruits.  Vegetables.  Whole grains.  Dried beans and peas.  Nuts and seeds.  Read package labels. Look for low-saturated fats, trans fat free, low-fat foods.  Choose cheese that have only 2 to 3 grams of saturated fat per ounce.  Use heart-healthy tub margarine that is free of trans fat or partially hydrogenated oil.  Avoid buying baked goods that have partially hydrogenated oils in them. Instead, buy baked goods made with whole grains (whole-wheat or whole oat flour). Avoid baked goods labeled with "flour" or "enriched flour."  Buy non-creamy canned soups with reduced salt and no added  fats. PREPARING YOUR FOOD  Broil, bake, steam, or roast foods. Do not fry food.  Use non-stick cooking sprays.  Use lemon or herbs to flavor food instead of using butter or stick margarine.  Use nonfat yogurt, salsa, or low-fat dressings for salads. LOW-SATURATED FAT / LOW-FAT FOOD SUBSTITUTES  Meats / Saturated Fat (g)  Avoid: Steak, marbled (3 oz/85 g) / 11 g.  Choose: Steak, lean (3 oz/85 g) / 4 g.  Avoid: Hamburger (3 oz/85 g) / 7 g.  Choose: Hamburger, lean (3 oz/85 g) / 5 g.  Avoid: Ham (3 oz/85 g) / 6 g.  Choose: Ham, lean cut (3 oz/85 g) / 2.4 g.  Avoid: Chicken, with skin, dark meat (3 oz/85 g) / 4 g.  Choose: Chicken, skin removed, dark meat (3 oz/85 g) / 2 g.  Avoid: Chicken, with skin, light meat (3 oz/85 g) / 2.5 g.  Choose: Chicken, skin removed, light meat (3 oz/85 g) / 1 g. Dairy / Saturated Fat (g)  Avoid: Whole milk (1 cup) / 5 g.  Choose: Low-fat milk, 2% (1 cup) / 3 g.  Choose: Low-fat milk, 1% (1 cup) / 1.5 g.  Choose: Skim milk (1 cup) / 0.3 g.  Avoid: Hard cheese (1 oz/28 g) / 6 g.  Choose: Skim milk cheese (1 oz/28 g) / 2 to 3 g.  Avoid: Cottage cheese, 4% fat (1 cup) / 6.5 g.  Choose: Low-fat cottage cheese, 1% fat (1 cup) / 1.5 g.  Avoid: Ice cream (1 cup) / 9 g.  Choose: Sherbet (1 cup) / 2.5 g.  Choose: Nonfat frozen yogurt (1 cup) / 0.3 g.  Choose: Frozen fruit bar / trace.  Avoid: Whipped cream (1 tbs) / 3.5 g.  Choose: Nondairy whipped topping (1 tbs) / 1 g. Condiments / Saturated Fat (g)  Avoid: Mayonnaise (1 tbs) / 2 g.  Choose: Low-fat mayonnaise (1 tbs) / 1 g.  Avoid: Butter (1 tbs) / 7 g.  Choose: Extra light margarine (1 tbs) / 1 g.  Avoid: Coconut oil (1 tbs) / 11.8 g.  Choose: Olive oil (1 tbs) / 1.8 g.  Choose: Corn oil (1 tbs) / 1.7 g.  Choose: Safflower oil (1 tbs) / 1.2 g.  Choose: Sunflower oil (1 tbs) / 1.4 g.  Choose: Soybean oil (1 tbs) / 2.4 g .  Choose: Canola oil (1 tbs) / 1  g. Document Released: 04/17/2012 Document Revised: 06/19/2013 Document Reviewed: 01/16/2014 [facility] Patient Information 2015 [place], [place]. This information is not intended to replace advice given to you by your health care provider. Make sure you discuss any questions you have with your health care provider.  Knee Exercises EXERCISES RANGE OF MOTION (ROM) AND STRETCHING EXERCISES These exercises may help you when beginning to rehabilitate your injury. Your symptoms may resolve with or without further involvement from your physician, physical therapist, or athletic trainer. While completing these exercises, remember:   Restoring tissue flexibility helps normal motion to return to the joints. This allows healthier, less painful movement and activity.  An effective stretch should be held for at least 30 seconds.  A stretch should never be painful. You should only feel a gentle lengthening or release in the stretched tissue. STRETCH - Knee Extension, Prone  Lie on your stomach on a firm surface, such as a bed or countertop. Place your right / left knee and leg just beyond the edge of the surface. You may wish to place a towel under the far end of your right / left thigh for comfort.  Relax your leg muscles and allow gravity to straighten your knee. Your clinician may advise you to add an ankle weight if more resistance is helpful for you.  You should feel a stretch in the back of your right / left knee. Hold this position for __________ seconds. Repeat __________ times. Complete this stretch __________ times per day. * Your physician, physical therapist, or athletic trainer may ask you to add ankle weight to enhance your stretch.  RANGE OF MOTION - Knee Flexion, Active  Lie on your back with both knees straight. (If this causes back discomfort, bend your opposite knee, placing your foot flat on the floor.)  Slowly slide your heel back toward your buttocks until you feel a gentle stretch in  the front of your knee or thigh.  Hold for __________ seconds. Slowly slide your heel back to the starting position. Repeat __________ times. Complete this exercise __________ times per day.  STRETCH - Quadriceps, Prone   Lie on your stomach on a firm surface, such as a bed or padded floor.  Bend your right / left knee and grasp your ankle. If you are unable to reach your ankle or pant leg, use a belt around your foot to lengthen your reach.  Gently pull your heel toward your buttocks. Your knee should not slide out to the side. You should feel a stretch in the front of your thigh and/or knee.  Hold this position for __________ seconds. Repeat __________ times. Complete this stretch  __________ times per day.  STRETCH - Hamstrings, Supine   Lie on your back. Loop a belt or towel over the ball of your right / left foot.  Straighten your right / left knee and slowly pull on the belt to raise your leg. Do not allow the right / left knee to bend. Keep your opposite leg flat on the floor.  Raise the leg until you feel a gentle stretch behind your right / left knee or thigh. Hold this position for __________ seconds. Repeat __________ times. Complete this stretch __________ times per day.  STRENGTHENING EXERCISES These exercises may help you when beginning to rehabilitate your injury. They may resolve your symptoms with or without further involvement from your physician, physical therapist, or athletic trainer. While completing these exercises, remember:   Muscles can gain both the endurance and the strength needed for everyday activities through controlled exercises.  Complete these exercises as instructed by your physician, physical therapist, or athletic trainer. Progress the resistance and repetitions only as guided.  You may experience muscle soreness or fatigue, but the pain or discomfort you are trying to eliminate should never worsen during these exercises. If this pain does worsen,  stop and make certain you are following the directions exactly. If the pain is still present after adjustments, discontinue the exercise until you can discuss the trouble with your clinician. STRENGTH - Quadriceps, Isometrics  Lie on your back with your right / left leg extended and your opposite knee bent.  Gradually tense the muscles in the front of your right / left thigh. You should see either your knee cap slide up toward your hip or increased dimpling just above the knee. This motion will push the back of the knee down toward the floor/mat/bed on which you are lying.  Hold the muscle as tight as you can without increasing your pain for __________ seconds.  Relax the muscles slowly and completely in between each repetition. Repeat __________ times. Complete this exercise __________ times per day.  STRENGTH - Quadriceps, Short Arcs   Lie on your back. Place a __________ inch towel roll under your knee so that the knee slightly bends.  Raise only your lower leg by tightening the muscles in the front of your thigh. Do not allow your thigh to rise.  Hold this position for __________ seconds. Repeat __________ times. Complete this exercise __________ times per day.  OPTIONAL ANKLE WEIGHTS: Begin with ____________________, but DO NOT exceed ____________________. Increase in 1 pound/0.5 kilogram increments.  STRENGTH - Quadriceps, Straight Leg Raises  Quality counts! Watch for signs that the quadriceps muscle is working to insure you are strengthening the correct muscles and not "cheating" by substituting with healthier muscles.  Lay on your back with your right / left leg extended and your opposite knee bent.  Tense the muscles in the front of your right / left thigh. You should see either your knee cap slide up or increased dimpling just above the knee. Your thigh may even quiver.  Tighten these muscles even more and raise your leg 4 to 6 inches off the floor. Hold for __________  seconds.  Keeping these muscles tense, lower your leg.  Relax the muscles slowly and completely in between each repetition. Repeat __________ times. Complete this exercise __________ times per day.  STRENGTH - Hamstring, Curls  Lay on your stomach with your legs extended. (If you lay on a bed, your feet may hang over the edge.)  Tighten the muscles in the back  of your thigh to bend your right / left knee up to 90 degrees. Keep your hips flat on the bed/floor.  Hold this position for __________ seconds.  Slowly lower your leg back to the starting position. Repeat __________ times. Complete this exercise __________ times per day.  OPTIONAL ANKLE WEIGHTS: Begin with ____________________, but DO NOT exceed ____________________. Increase in 1 pound/0.5 kilogram increments.  STRENGTH - Quadriceps, Squats  Stand in a door frame so that your feet and knees are in line with the frame.  Use your hands for balance, not support, on the frame.  Slowly lower your weight, bending at the hips and knees. Keep your lower legs upright so that they are parallel with the door frame. Squat only within the range that does not increase your knee pain. Never let your hips drop below your knees.  Slowly return upright, pushing with your legs, not pulling with your hands. Repeat __________ times. Complete this exercise __________ times per day.  STRENGTH - Quadriceps, Wall Slides  Follow guidelines for form closely. Increased knee pain often results from poorly placed feet or knees.  Lean against a smooth wall or door and walk your feet out 18-24 inches. Place your feet hip-width apart.  Slowly slide down the wall or door until your knees bend __________ degrees.* Keep your knees over your heels, not your toes, and in line with your hips, not falling to either side.  Hold for __________ seconds. Stand up to rest for __________ seconds in between each repetition. Repeat __________ times. Complete this  exercise __________ times per day. * Your physician, physical therapist, or athletic trainer will alter this angle based on your symptoms and progress. Document Released: 08/31/2005 Document Revised: 03/03/2014 Document Reviewed: 01/29/2009 [facility] Patient Information 2015 [place], [place]. This information is not intended to replace advice given to you by your health care provider. Make sure you discuss any questions you have with your health care provider.

## 2014-12-08 NOTE — Progress Notes (Signed)
Subjective:     Patient ID: Rebecca Ochoa, female   DOB: [birth year], 54 y.o.   MRN: [id number]  HPI Comments: 54 yo WF CPE and non-compliance with cholesterol follow up. She did not follow up as directed with all labs. She keeps busy with work. She does not eat healthy. She does have family history of heart disease with father in his 66's. Her BS was also elevated at last OV and she has FMHX of DM.   She notes asthma is controlled and has not used inhaler in over 1 year. She declines refill of inhaler. She declines updating pneumonia vaccine despite chronic/ current tobacco history. She declines CXR evaluation.  She has been having right knee pain without injury on/ off x 1 month. She notes hurts more with applied pressure or kneeling. She denies any weakness.   She refuses colonoscopy despite long discussions at each CPE about risks associated with colon cancer and need for exam.     Lab Results      Component                Value               Date                      WBC                      14.7*               10/16/2013                HGB                      13.2                10/16/2013                HCT                      38.3                10/16/2013                PLT                      282                 10/16/2013                GLUCOSE                  109*                10/16/2013                CHOL                     206*                10/16/2013                TRIG                     145                 10/16/2013  HDL                      44                  10/16/2013                LDLCALC                  133*                10/16/2013                ALT                      13                  10/16/2013                AST                      16                  10/16/2013                NA                       140                 10/16/2013                K                        4.6                 10/16/2013                CL                        103                 10/16/2013                CREATININE               0.73                10/16/2013                BUN                      6                   10/16/2013                CO2                      29                  10/16/2013                TSH                      0.266*              10/16/2013               Medication  List       This list is accurate as of: 12/08/14  1:39 PM.  Always use your most recent med list.               ALPRAZolam 1 MG tablet  Commonly known as:  XANAX  TAKE 1 TABLET BY MOUTH THREE TIMES DAILY AS NEEDED FOR ANXIETY     aspirin 81 MG chewable tablet  Chew by mouth daily.       Allergies  Allergen Reactions  . Biaxin [Clarithromycin] Nausea Only   Past Medical History  Diagnosis Date  . Hyperlipidemia   . Anemia   . Vitamin D deficiency   . Asthma, mild intermittent, well-controlled   . GERD (gastroesophageal reflux disease)   . Hx of migraines    Past Surgical History  Procedure Laterality Date  . Tubal ligation    . Breast surgery      reduction   History  Substance Use Topics  . Smoking status: Current Every Day Smoker  . Smokeless tobacco: Not on file  . Alcohol Use: Not on file   Family History  Problem Relation Age of Onset  . Heart disease Father   . Hypertension Father   . Diabetes Father   . Kidney disease Father     History   Social History  . Marital Status: Married    Spouse Name: N/A    Number of Children: N/A  . Years of Education: N/A   Social History Main Topics  . Smoking status: Current Every Day Smoker  . Smokeless tobacco: None  . Alcohol Use: None  . Drug Use: None  . Sexual Activity: None   Other Topics Concern  . None   Social History Narrative   MAINTENANCE: Colonoscopy: REFUSES Mammo:03/2014 @ GYN WNL BMD: OVERDUE will discuss with GYN Pap/ Pelvic: 2014 WNL @ GYN EYE: Glasses 12/2013 WNL Dentist: OVERDUE LMP: since before  2014  IMMUNIZATIONS: Td:2011 Pneumovax:1998 Zostavax:n/a Influenza: declines  Patient Care Team: [name], MD as PCP - General (Internal Medicine) Francene Boyers, MD as Referring Physician (Optometry) Mickel Baas, MD as Consulting Physician (Obstetrics and Gynecology) Arminda Resides, MD as Consulting Physician (Dermatology)    Review of Systems  Constitutional: Negative for fatigue.  Respiratory: Negative for shortness of breath.   Cardiovascular: Negative for chest pain and leg swelling.  Musculoskeletal: Positive for joint swelling and arthralgias.  All other systems reviewed and are negative.  BP 124/70 mmHg  Pulse 76  Temp(Src) 98.2 F (36.8 C) (Temporal)  Resp 16  Ht 5' 5.25" (1.657 m)  Wt 161 lb (73.029 kg)  BMI 26.60 kg/m2     Objective:   Physical Exam  Constitutional: She is oriented to person, place, and time. She appears well-developed and well-nourished. No distress.  HENT:  Head: Normocephalic.  Nose: Nose normal.  Mouth/Throat: Oropharynx is clear and moist.  Eyes: Conjunctivae and EOM are normal. Pupils are equal, round, and reactive to light. No scleral icterus.  Neck: Normal range of motion. Neck supple. No JVD present. No tracheal deviation present. No thyromegaly present.  Cardiovascular: Normal rate, regular rhythm, normal heart sounds and intact distal pulses.   Pulmonary/Chest: Effort normal and breath sounds normal.  Abdominal: Soft. Bowel sounds are normal. She exhibits no distension and no mass. There is no tenderness.  Genitourinary:  Def GYN  Musculoskeletal: Normal range of motion. She exhibits tenderness. She exhibits no edema.  Right knee with ? Lateral plica and mild TTP  Lymphadenopathy:    She has no cervical adenopathy.  Neurological: She is alert and oriented to person, place, and time. She has normal reflexes. No cranial nerve deficit. She exhibits normal muscle tone. Coordination normal.  Skin: Skin is warm and  dry. No rash noted. No erythema.  Psychiatric: She has a normal mood and affect. Her behavior is normal. Judgment and thought content normal.  Nursing note and vitals reviewed.     AORTA SCAN WNL EKG NSCSPT  Assessment:     Routine general medical examination at a health care facility - Plan: Hepatic function panel, Lipid panel, BASIC METABOLIC PANEL WITH GFR, Insulin, fasting, Hemoglobin A1c, CBC with Differential/Platelet, TSH, Vit D  25 hydroxy (rtn osteoporosis monitoring), Magnesium, EKG 12-Lead, TB Skin Test, POC Hemoccult Bld/Stl (3-Cd Home Screen), Urinalysis, Routine w reflex microscopic, Microalbumin / creatinine urine ratio, [place], RETROPERITNL ABD,  LTD  Screening examination for pulmonary tuberculosis - Plan: TB Skin Test       Plan:     1. CPE- Update screening labs/ History/ Immunizations/ Testing as needed. Advised healthy diet, QD exercise, increase H20 and continue RX/ Vitamins AD.  2.Knee pain- w/c if wants referral to [facility]. Advised of knee strengthening exercises. Advised probable lateral plica and needs Ortho evaluation  3. Cholesterol- recheck labs, Need to eat healthier and exercise AD. Advised with family history could be genetic and needs to follow up as directed.   4. NON-Compliant with abnormal lab recheck- Advised several abnormal labs at last visit and needs to be more compliant or at risk for increased adverse complications  5. Tobacco Dep- advised cessation techniques and need for d/c to decrease Risk   6. Anxiety- Controlled currently, continue RX AD w/c if SX increase or ER, recommend counseling if symptoms continue

## 2014-12-09 LAB — BASIC METABOLIC PANEL WITH GFR
BUN: 6 mg/dL (ref 6–23)
CO2: 27 mEq/L (ref 19–32)
Calcium: 9.5 mg/dL (ref 8.4–10.5)
Chloride: 101 mEq/L (ref 96–112)
Creat: 0.6 mg/dL (ref 0.50–1.10)
GFR, Est African American: >89 mL/min
GFR, Est Non African American: >89 mL/min
Glucose, Bld: 85 mg/dL (ref 70–99)
Potassium: 3.9 mEq/L (ref 3.5–5.3)
Sodium: 140 mEq/L (ref 135–145)

## 2014-12-09 LAB — LIPID PANEL
Cholesterol: 211 mg/dL — ABNORMAL HIGH (ref 0–200)
HDL: 42 mg/dL (ref >39)
LDL Cholesterol: 137 mg/dL — ABNORMAL HIGH (ref 0–99)
Total CHOL/HDL Ratio: 5 Ratio
Triglycerides: 159 mg/dL — ABNORMAL HIGH (ref <150)
VLDL: 32 mg/dL (ref 0–40)

## 2014-12-09 LAB — URINALYSIS, ROUTINE W REFLEX MICROSCOPIC
Bilirubin Urine: NEGATIVE
Glucose, UA: NEGATIVE mg/dL
Hgb urine dipstick: NEGATIVE
Ketones, ur: NEGATIVE mg/dL
Leukocytes, UA: NEGATIVE
Nitrite: NEGATIVE
Protein, ur: NEGATIVE mg/dL
Specific Gravity, Urine: <1.005 — ABNORMAL LOW (ref 1.005–1.030)
Urobilinogen, UA: 0.2 mg/dL (ref 0.0–1.0)
pH: 7 (ref 5.0–8.0)

## 2014-12-09 LAB — HEPATIC FUNCTION PANEL
ALT: 10 U/L (ref 0–35)
AST: 13 U/L (ref 0–37)
Albumin: 4.5 g/dL (ref 3.5–5.2)
Alkaline Phosphatase: 116 U/L (ref 39–117)
Bilirubin, Direct: 0.1 mg/dL (ref 0.0–0.3)
Indirect Bilirubin: 0.3 mg/dL (ref 0.2–1.2)
Total Bilirubin: 0.4 mg/dL (ref 0.2–1.2)
Total Protein: 7.5 g/dL (ref 6.0–8.3)

## 2014-12-09 LAB — MICROALBUMIN / CREATININE URINE RATIO
Creatinine, Urine: 13.5 mg/dL
Microalb, Ur: <0.2 mg/dL (ref <2.0)

## 2014-12-09 LAB — MAGNESIUM: Magnesium: 2 mg/dL (ref 1.5–2.5)

## 2014-12-09 LAB — INSULIN, FASTING: Insulin fasting, serum: 3.3 u[IU]/mL (ref 2.0–19.6)

## 2014-12-09 LAB — VITAMIN D 25 HYDROXY (VIT D DEFICIENCY, FRACTURES): Vit D, 25-Hydroxy: 18 ng/mL — ABNORMAL LOW (ref 30–100)

## 2014-12-09 LAB — HEMOGLOBIN A1C
Hgb A1c MFr Bld: 5.7 % — ABNORMAL HIGH (ref <5.7)
Mean Plasma Glucose: 117 mg/dL — ABNORMAL HIGH (ref <117)

## 2014-12-09 LAB — TSH: TSH: 1.402 u[IU]/mL (ref 0.350–4.500)

## 2014-12-11 LAB — TB SKIN TEST
Induration: 0 mm
TB Skin Test: NEGATIVE

## 2015-02-19 ENCOUNTER — Encounter: Payer: Self-pay | Admitting: *Deleted

## 2015-03-03 ENCOUNTER — Encounter: Payer: Self-pay | Admitting: Emergency Medicine

## 2015-03-06 ENCOUNTER — Other Ambulatory Visit: Payer: Self-pay | Admitting: Physician Assistant

## 2015-03-09 ENCOUNTER — Other Ambulatory Visit: Payer: Self-pay

## 2015-03-09 MED ORDER — ALPRAZOLAM 1 MG PO TABS: ORAL_TABLET | ORAL | Status: AC

## 2015-11-27 ENCOUNTER — Other Ambulatory Visit: Payer: Self-pay | Admitting: Internal Medicine

## 2015-12-01 ENCOUNTER — Other Ambulatory Visit: Payer: Self-pay

## 2015-12-01 ENCOUNTER — Ambulatory Visit (INDEPENDENT_AMBULATORY_CARE_PROVIDER_SITE_OTHER): Payer: BLUE CROSS/BLUE SHIELD | Admitting: Physician Assistant

## 2015-12-01 VITALS — BP 124/60 | HR 80 | Temp 98.1°F | Resp 16 | Ht 65.25 in | Wt 132.4 lb

## 2015-12-01 DIAGNOSIS — R7309 Other abnormal glucose: Secondary | ICD-10-CM | POA: Diagnosis not present

## 2015-12-01 DIAGNOSIS — L659 Nonscarring hair loss, unspecified: Secondary | ICD-10-CM | POA: Insufficient documentation

## 2015-12-01 DIAGNOSIS — Z79899 Other long term (current) drug therapy: Secondary | ICD-10-CM

## 2015-12-01 DIAGNOSIS — Z9119 Patient's noncompliance with other medical treatment and regimen: Secondary | ICD-10-CM | POA: Diagnosis not present

## 2015-12-01 DIAGNOSIS — Z8669 Personal history of other diseases of the nervous system and sense organs: Secondary | ICD-10-CM | POA: Diagnosis not present

## 2015-12-01 DIAGNOSIS — E559 Vitamin D deficiency, unspecified: Secondary | ICD-10-CM

## 2015-12-01 DIAGNOSIS — D649 Anemia, unspecified: Secondary | ICD-10-CM

## 2015-12-01 DIAGNOSIS — F17200 Nicotine dependence, unspecified, uncomplicated: Secondary | ICD-10-CM | POA: Insufficient documentation

## 2015-12-01 DIAGNOSIS — Z91199 Patient's noncompliance with other medical treatment and regimen due to unspecified reason: Secondary | ICD-10-CM

## 2015-12-01 DIAGNOSIS — E785 Hyperlipidemia, unspecified: Secondary | ICD-10-CM

## 2015-12-01 LAB — VITAMIN B12: Vitamin B-12: 441 pg/mL (ref 211–911)

## 2015-12-01 LAB — CBC WITH DIFFERENTIAL/PLATELET
Basophils Absolute: 0 10*3/uL (ref 0.0–0.1)
Basophils Relative: 0 % (ref 0–1)
Eosinophils Absolute: 0.2 10*3/uL (ref 0.0–0.7)
Eosinophils Relative: 2 % (ref 0–5)
HCT: 40.2 % (ref 36.0–46.0)
Hemoglobin: 13.8 g/dL (ref 12.0–15.0)
Lymphocytes Relative: 26 % (ref 12–46)
Lymphs Abs: 2.6 10*3/uL (ref 0.7–4.0)
MCH: 32.1 pg (ref 26.0–34.0)
MCHC: 34.3 g/dL (ref 30.0–36.0)
MCV: 93.5 fL (ref 78.0–100.0)
MPV: 11.5 fL (ref 8.6–12.4)
Monocytes Absolute: 0.5 10*3/uL (ref 0.1–1.0)
Monocytes Relative: 5 % (ref 3–12)
Neutro Abs: 6.6 10*3/uL (ref 1.7–7.7)
Neutrophils Relative %: 67 % (ref 43–77)
Platelets: 230 10*3/uL (ref 150–400)
RBC: 4.3 MIL/uL (ref 3.87–5.11)
RDW: 13.3 % (ref 11.5–15.5)
WBC: 9.9 10*3/uL (ref 4.0–10.5)

## 2015-12-01 LAB — BASIC METABOLIC PANEL WITH GFR
BUN: 8 mg/dL (ref 7–25)
CO2: 28 mmol/L (ref 20–31)
Calcium: 9.6 mg/dL (ref 8.6–10.4)
Chloride: 106 mmol/L (ref 98–110)
Creat: 0.54 mg/dL (ref 0.50–1.05)
GFR, Est African American: >89 mL/min (ref >=60)
GFR, Est Non African American: >89 mL/min (ref >=60)
Glucose, Bld: 86 mg/dL (ref 65–99)
Potassium: 4.2 mmol/L (ref 3.5–5.3)
Sodium: 140 mmol/L (ref 135–146)

## 2015-12-01 LAB — FERRITIN: Ferritin: 129 ng/mL (ref 10–291)

## 2015-12-01 LAB — HEPATIC FUNCTION PANEL
ALT: 10 U/L (ref 6–29)
AST: 13 U/L (ref 10–35)
Albumin: 3.8 g/dL (ref 3.6–5.1)
Alkaline Phosphatase: 82 U/L (ref 33–130)
Bilirubin, Direct: 0.1 mg/dL (ref <=0.2)
Indirect Bilirubin: 0.4 mg/dL (ref 0.2–1.2)
Total Bilirubin: 0.5 mg/dL (ref 0.2–1.2)
Total Protein: 7.2 g/dL (ref 6.1–8.1)

## 2015-12-01 LAB — IRON AND TIBC
%SAT: 23 % (ref 11–50)
Iron: 69 ug/dL (ref 45–160)
TIBC: 305 ug/dL (ref 250–450)
UIBC: 236 ug/dL (ref 125–400)

## 2015-12-01 LAB — LIPID PANEL
Cholesterol: 169 mg/dL (ref 125–200)
HDL: 42 mg/dL — ABNORMAL LOW (ref >=46)
LDL Cholesterol: 102 mg/dL (ref <130)
Total CHOL/HDL Ratio: 4 Ratio (ref <=5.0)
Triglycerides: 126 mg/dL (ref <150)
VLDL: 25 mg/dL (ref <30)

## 2015-12-01 LAB — TSH: TSH: 1.281 u[IU]/mL (ref 0.350–4.500)

## 2015-12-01 LAB — MAGNESIUM: Magnesium: 1.9 mg/dL (ref 1.5–2.5)

## 2015-12-01 MED ORDER — ALPRAZOLAM 1 MG PO TABS: 1.0000 mg | ORAL_TABLET | Freq: Two times a day (BID) | ORAL | Status: DC | PRN

## 2015-12-01 NOTE — Progress Notes (Signed)
Assessment and Plan:  1. Hyperlipidemia - CBC with Differential/Platelet - BASIC METABOLIC PANEL WITH GFR - Hepatic function panel - TSH - Lipid panel  2. Hx of migraines controlled  3. Anemia, unspecified anemia type - Iron and TIBC - Ferritin - Vitamin B12 - Zinc  4. Vitamin D deficiency - VITAMIN D 25 Hydroxy (Vit-D Deficiency, Fractures)  5. Tobacco dependence Advised to quit smoking - DG Chest 2 View; Future  6. Noncompliance Advised to keep appointments  7. Hair loss ? From stress, check labs - Iron and TIBC - Ferritin - Vitamin B12 - Zinc  8. Medication management - Magnesium  9. Weight loss Likely from decreased soda, weight now stable, but will get CXR, MGM, and advised colonoscopy, check labs.    Continue diet and meds as discussed. Further disposition pending results of labs. Over 30 minutes of exam, counseling, chart review, and critical decision making was performed  HPI 55 y.o. female  presents for 3 month follow up on hypertension, cholesterol, prediabetes, and vitamin D deficiency.   Her blood pressure has been controlled at home, today their BP is BP: 124/60 mmHg  She does not workout. She denies chest pain, shortness of breath, dizziness. Continues to smoke.  She is complaining of hair thinning, has started on biotin Dec 8th, has not seen a help in it. She is on MVIT. 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  Will have right shoulder blade burning and tingling, has not been an issue x 1 week. Worse with standing, was constant burning pain, better with sitting back in a recliner. Has had some heart burn, no nausea. Rare alcohol/aleve.   She is not on cholesterol medication and denies myalgias. Her cholesterol is at goal. The cholesterol last visit was:   Lab Results  Component Value Date   CHOL 211* 12/08/2014   HDL 42 12/08/2014   LDLCALC 137* 12/08/2014   TRIG 159* 12/08/2014   CHOLHDL 5.0 12/08/2014   Patient  is on Vitamin D supplement.   Lab Results  Component Value Date   VD25OH 18* 12/08/2014     BMI is Body mass index is 21.87 kg/(m^2)., she has lost a lot of weight, states she stopped soda 2-3 months ago after oral surgery. Was drinking 8-10 cans a day. However she has never had colonoscopy, declines CXR, and overdue for MGM.  Wt Readings from Last 3 Encounters:  12/01/15 132 lb 6.4 oz (60.056 kg)  12/08/14 161 lb (73.029 kg)  03/26/14 153 lb (69.4 kg)    Current Medications:  Current Outpatient Prescriptions on File Prior to Visit  Medication Sig Dispense Refill  . aspirin 81 MG chewable tablet Chew by mouth daily.     No current facility-administered medications on file prior to visit.   Medical History:  Past Medical History  Diagnosis Date  . Hyperlipidemia   . Anemia   . Vitamin D deficiency   . Asthma, mild intermittent, well-controlled   . GERD (gastroesophageal reflux disease)   . Hx of migraines    Allergies:  Allergies  Allergen Reactions  . Biaxin [Clarithromycin] Nausea Only     Review of Systems:  Review of Systems  Constitutional: Positive for weight loss. Negative for fever, chills, malaise/fatigue and diaphoresis.  HENT: Positive for congestion. Negative for ear discharge, ear pain, hearing loss, nosebleeds, sore throat and tinnitus.   Respiratory: Positive for cough, sputum production and shortness of breath. Negative for hemoptysis, wheezing and stridor.  Cardiovascular: Negative.   Gastrointestinal: Positive for heartburn. Negative for nausea, vomiting, abdominal pain, diarrhea, constipation, blood in stool and melena.  Genitourinary: Negative.   Musculoskeletal: Positive for back pain (right shoulder blade). Negative for myalgias, joint pain, falls and neck pain.  Skin: Negative.  Negative for rash.  Neurological: Negative.  Negative for dizziness, weakness and headaches.  Psychiatric/Behavioral: Negative.  Negative for depression.    Family  history- Review and unchanged Social history- Review and unchanged Physical Exam: BP 124/60 mmHg  Pulse 80  Temp(Src) 98.1 F (36.7 C) (Temporal)  Resp 16  Ht 5' 5.25" (1.657 m)  Wt 132 lb 6.4 oz (60.056 kg)  BMI 21.87 kg/m2  SpO2 96% Wt Readings from Last 3 Encounters:  12/01/15 132 lb 6.4 oz (60.056 kg)  12/08/14 161 lb (73.029 kg)  03/26/14 153 lb (69.4 kg)   General Appearance: Well nourished, in no apparent distress. Eyes: PERRLA, EOMs, conjunctiva no swelling or erythema Sinuses: + Frontal/maxillary tenderness ENT/Mouth: Ext aud canals clear, TMs without erythema, bulging. No erythema, swelling, or exudate on post pharynx.  Tonsils not swollen or erythematous. Hearing normal.  Neck: Supple, thyroid normal.  Respiratory: Respiratory effort normal, BS equal bilaterally with mild wheezing without rales, rhonchi, or stridor.  Cardio: RRR with no MRGs. Brisk peripheral pulses without edema.  Abdomen: Soft, + BS,  Non tender, no guarding, rebound, hernias, masses. Lymphatics: Non tender without lymphadenopathy.  Musculoskeletal: Full ROM, 5/5 strength, Normal gait, full ROM back, nontender thoracic spine, no rash. Skin: Warm, dry without rashes, lesions, ecchymosis.  Neuro: Cranial nerves intact. Normal muscle tone, no cerebellar symptoms. Psych: Awake and oriented X 3, normal affect, Insight and Judgment appropriate.    Quentin Mulling, PA-C 11:17 AM [facility]

## 2015-12-01 NOTE — Patient Instructions (Addendum)
[facility]  7 a.m.-6:30 p.m., Monday 7 a.m.-5 p.m., Tuesday-Friday Schedule an appointment by calling [phone].  [facility] Schedule an appointment by calling [phone].  Add ENTERIC COATED low dose 81 mg Aspirin daily OR can do every other day if you have easy bruising to protect your heart and head. As well as to reduce risk of Colon Cancer by 20 %, Skin Cancer by 26 % , Melanoma by 46% and Pancreatic cancer by 60%   Vitamin D goal is between 60-80  Please make sure that you are taking your Vitamin D as directed.   It is very important as a natural anti-inflammatory   helping hair, skin, and nails, as well as reducing stroke and heart attack risk.   It helps your bones and helps with mood.  It also decreases numerous cancer risks so please take it as directed.   Low Vit D is associated with a 200-300% higher risk for CANCER   and 200-300% higher risk for HEART   ATTACK  &  STROKE.    .....................................Marland Kitchen  It is also associated with higher death rate at younger ages,   autoimmune diseases like Rheumatoid arthritis, Lupus, Multiple Sclerosis.     Also many other serious conditions, like depression, Alzheimer's  Dementia, infertility, muscle aches, fatigue, fibromyalgia - just to name a few.  +++++++++++++++++++   If you have a smart phone, please look up Smoke Free app, this will help you stay on track and give you information about money you have saved, life that you have gained back and a ton of more information.   We are giving you chantix for smoking cessation. You can do it! And we are here to help! You may have heard some scary side effects about chantix, the three most common I hear about are nausea, crazy dreams and depression.  However, I like for my patients to try to stay on 1/2 a tablet twice a day rather than one tablet twice a day as normally prescribed. This helps decrease the chances of side  effects and helps save money by making a one month prescription last two months  Please start the prescription this way:  Start 1/2 tablet by mouth once daily after food with a full glass of water for 3 days Then do 1/2 tablet by mouth twice daily for 4 days. During this first week you can smoke, but try to stop after this week.  At this point we have several options: 1) continue on 1/2 tablet twice a day- which I encourage you to do. You can stay on this dose the rest of the time on the medication or if you still feel the need to smoke you can do one of the two options below. 2) do one tablet in the morning and 1/2 in the evening which helps decrease dreams. 3) do one tablet twice a day.   What if I miss a dose? If you miss a dose, take it as soon as you can. If it is almost time for your next dose, take only that dose. Do not take double or extra doses.  What should I watch for while using this medicine? Visit your doctor or health care professional for regular check ups. Ask for ongoing advice and encouragement from your doctor or healthcare professional, friends, and family to help you quit. If you smoke while on this medication, quit again  Your mouth may get dry. Chewing sugarless gum or hard candy,  and drinking plenty of water may help. Contact your doctor if the problem does not go away or is severe.  You may get drowsy or dizzy. Do not drive, use machinery, or do anything that needs mental alertness until you know how this medicine affects you. Do not stand or sit up quickly, especially if you are an older patient.   The use of this medicine may increase the chance of suicidal thoughts or actions. Pay special attention to how you are responding while on this medicine. Any worsening of mood, or thoughts of suicide or dying should be reported to your health care professional right away.  ADVANTAGES OF QUITTING SMOKING  Within 20 minutes, blood pressure decreases. Your pulse is at  normal level.  After 8 hours, carbon monoxide levels in the blood return to normal. Your oxygen level increases.  After 24 hours, the chance of having a heart attack starts to decrease. Your breath, hair, and body stop smelling like smoke.  After 48 hours, damaged nerve endings begin to recover. Your sense of taste and smell improve.  After 72 hours, the body is virtually free of nicotine. Your bronchial tubes relax and breathing becomes easier.  After 2 to 12 weeks, lungs can hold more air. Exercise becomes easier and circulation improves.  After 1 year, the risk of coronary heart disease is cut in half.  After 5 years, the risk of stroke falls to the same as a nonsmoker.  After 10 years, the risk of lung cancer is cut in half and the risk of other cancers decreases significantly.  After 15 years, the risk of coronary heart disease drops, usually to the level of a nonsmoker.  You will have extra money to spend on things other than cigarettes.

## 2015-12-02 LAB — VITAMIN D 25 HYDROXY (VIT D DEFICIENCY, FRACTURES): Vit D, 25-Hydroxy: 35 ng/mL (ref 30–100)

## 2015-12-05 LAB — ZINC: Zinc: 68 ug/dL (ref 60–130)

## 2015-12-09 ENCOUNTER — Encounter: Payer: Self-pay | Admitting: Emergency Medicine

## 2015-12-24 ENCOUNTER — Ambulatory Visit (HOSPITAL_COMMUNITY)
Admission: RE | Admit: 2015-12-24 | Discharge: 2015-12-24 | Disposition: A | Discharge status: Home / Self Care | Payer: BLUE CROSS/BLUE SHIELD | Source: Ambulatory Visit | Attending: Physician Assistant | Admitting: Physician Assistant

## 2015-12-24 DIAGNOSIS — R05 Cough: Secondary | ICD-10-CM | POA: Diagnosis present

## 2015-12-24 DIAGNOSIS — F17200 Nicotine dependence, unspecified, uncomplicated: Secondary | ICD-10-CM | POA: Diagnosis not present

## 2016-02-25 ENCOUNTER — Other Ambulatory Visit: Payer: Self-pay | Admitting: Internal Medicine

## 2016-03-10 ENCOUNTER — Encounter: Payer: Self-pay | Admitting: Internal Medicine

## 2016-03-10 ENCOUNTER — Ambulatory Visit (INDEPENDENT_AMBULATORY_CARE_PROVIDER_SITE_OTHER): Payer: BLUE CROSS/BLUE SHIELD | Admitting: Internal Medicine

## 2016-03-10 VITALS — BP 136/62 | HR 76 | Temp 98.0°F | Resp 16 | Ht 65.25 in | Wt 120.0 lb

## 2016-03-10 DIAGNOSIS — J069 Acute upper respiratory infection, unspecified: Secondary | ICD-10-CM

## 2016-03-10 MED ORDER — ALBUTEROL SULFATE HFA 108 (90 BASE) MCG/ACT IN AERS: 2.0000 | INHALATION_SPRAY | Freq: Four times a day (QID) | RESPIRATORY_TRACT | Status: DC | PRN

## 2016-03-10 MED ORDER — PSEUDOEPH-BROMPHEN-DM 30-2-10 MG/5ML PO SYRP: 5.0000 mL | ORAL_SOLUTION | Freq: Four times a day (QID) | ORAL | Status: DC | PRN

## 2016-03-10 MED ORDER — PREDNISONE 20 MG PO TABS: ORAL_TABLET | ORAL | Status: DC

## 2016-03-10 NOTE — Progress Notes (Signed)
HPI  Patient presents to the office for evaluation of cough and sore throat.  It has been going on for 1 days.  Patient reports dry, barky, cough.  They also endorse change in voice, postnasal drip and sore throat.  They have tried ibuprofen.  They report that nothing has worked.  They admits to other sick contacts.  She reports that both of her sons are sick.  She reports that her son has bronchitis currently.  She is smokes a pack per day.    Review of Systems  Constitutional: Negative for fever, chills and malaise/fatigue.  HENT: Positive for congestion, ear pain and sore throat.   Respiratory: Positive for cough and wheezing. Negative for sputum production and shortness of breath.   Cardiovascular: Negative for chest pain, palpitations and leg swelling.  Skin: Negative.   Neurological: Negative for headaches.    PE: Filed Vitals:   03/10/16 1531  BP: 136/62  Pulse: 76  Temp: 98 F (36.7 C)  Resp: 16     General:  Alert and non-toxic, WDWN, NAD HEENT: NCAT, PERLA, EOM normal, no occular discharge or erythema.  Nasal mucosal edema with sinus tenderness to palpation.  Oropharynx clear with minimal oropharyngeal edema and erythema.  Mucous membranes moist and pink. Neck:  Cervical adenopathy Chest:  RRR no MRGs.  Lungs clear to auscultation A&P with no wheezes rhonchi or rales.   Abdomen: +BS x 4 quadrants, soft, non-tender, no guarding, rigidity, or rebound. Skin: warm and dry no rash Neuro: A&Ox4, CN II-XII grossly intact  Assessment and Plan:   1. Acute URI -nasal saline -zyrtec - predniSONE (DELTASONE) 20 MG tablet; 3 tabs po day one, then 2 tabs daily x 4 days  Dispense: 11 tablet; Refill: 0 - albuterol (PROVENTIL HFA;VENTOLIN HFA) 108 (90 Base) MCG/ACT inhaler; Inhale 2 puffs into the lungs every 6 (six) hours as needed for wheezing or shortness of breath (cough).  Dispense: 1 Inhaler; Refill: 0 - brompheniramine-pseudoephedrine-DM 30-2-10 MG/5ML syrup; Take 5 mLs by  mouth 4 (four) times daily as needed.  Dispense: 473 mL; Refill: 0 -likely viral -recommended quitting smoking.

## 2016-03-17 DIAGNOSIS — Z1231 Encounter for screening mammogram for malignant neoplasm of breast: Secondary | ICD-10-CM | POA: Diagnosis not present

## 2016-03-17 DIAGNOSIS — Z01419 Encounter for gynecological examination (general) (routine) without abnormal findings: Secondary | ICD-10-CM | POA: Diagnosis not present

## 2016-03-17 DIAGNOSIS — Z6820 Body mass index (BMI) 20.0-20.9, adult: Secondary | ICD-10-CM | POA: Diagnosis not present

## 2016-03-17 DIAGNOSIS — Z124 Encounter for screening for malignant neoplasm of cervix: Secondary | ICD-10-CM | POA: Diagnosis not present

## 2016-04-20 ENCOUNTER — Other Ambulatory Visit: Payer: Self-pay | Admitting: Internal Medicine

## 2016-05-31 ENCOUNTER — Encounter: Payer: Self-pay | Admitting: Physician Assistant

## 2016-05-31 ENCOUNTER — Ambulatory Visit (INDEPENDENT_AMBULATORY_CARE_PROVIDER_SITE_OTHER): Payer: BLUE CROSS/BLUE SHIELD | Admitting: Physician Assistant

## 2016-05-31 VITALS — BP 108/64 | HR 77 | Temp 97.7°F | Resp 14 | Ht 65.0 in | Wt 129.0 lb

## 2016-05-31 DIAGNOSIS — E785 Hyperlipidemia, unspecified: Secondary | ICD-10-CM

## 2016-05-31 DIAGNOSIS — E559 Vitamin D deficiency, unspecified: Secondary | ICD-10-CM | POA: Diagnosis not present

## 2016-05-31 DIAGNOSIS — Z0000 Encounter for general adult medical examination without abnormal findings: Secondary | ICD-10-CM | POA: Diagnosis not present

## 2016-05-31 DIAGNOSIS — I10 Essential (primary) hypertension: Secondary | ICD-10-CM | POA: Diagnosis not present

## 2016-05-31 DIAGNOSIS — J45909 Unspecified asthma, uncomplicated: Secondary | ICD-10-CM

## 2016-05-31 DIAGNOSIS — Z136 Encounter for screening for cardiovascular disorders: Secondary | ICD-10-CM | POA: Diagnosis not present

## 2016-05-31 DIAGNOSIS — F17200 Nicotine dependence, unspecified, uncomplicated: Secondary | ICD-10-CM

## 2016-05-31 DIAGNOSIS — Z91199 Patient's noncompliance with other medical treatment and regimen due to unspecified reason: Secondary | ICD-10-CM

## 2016-05-31 DIAGNOSIS — Z79899 Other long term (current) drug therapy: Secondary | ICD-10-CM

## 2016-05-31 DIAGNOSIS — Z8669 Personal history of other diseases of the nervous system and sense organs: Secondary | ICD-10-CM

## 2016-05-31 DIAGNOSIS — Z9119 Patient's noncompliance with other medical treatment and regimen: Secondary | ICD-10-CM

## 2016-05-31 DIAGNOSIS — Z1389 Encounter for screening for other disorder: Secondary | ICD-10-CM

## 2016-05-31 DIAGNOSIS — R7309 Other abnormal glucose: Secondary | ICD-10-CM

## 2016-05-31 DIAGNOSIS — D649 Anemia, unspecified: Secondary | ICD-10-CM

## 2016-05-31 LAB — IRON AND TIBC
%SAT: 30 % (ref 11–50)
Iron: 92 ug/dL (ref 45–160)
TIBC: 305 ug/dL (ref 250–450)
UIBC: 213 ug/dL (ref 125–400)

## 2016-05-31 LAB — BASIC METABOLIC PANEL WITH GFR
BUN: 7 mg/dL (ref 7–25)
CO2: 31 mmol/L (ref 20–31)
Calcium: 9.6 mg/dL (ref 8.6–10.4)
Chloride: 104 mmol/L (ref 98–110)
Creat: 0.73 mg/dL (ref 0.50–1.05)
GFR, Est African American: >89 mL/min (ref >=60)
GFR, Est Non African American: >89 mL/min (ref >=60)
Glucose, Bld: 93 mg/dL (ref 65–99)
Potassium: 4.1 mmol/L (ref 3.5–5.3)
Sodium: 141 mmol/L (ref 135–146)

## 2016-05-31 LAB — TSH: TSH: 0.86 mIU/L

## 2016-05-31 LAB — HEPATIC FUNCTION PANEL
ALT: 11 U/L (ref 6–29)
AST: 13 U/L (ref 10–35)
Albumin: 4.4 g/dL (ref 3.6–5.1)
Alkaline Phosphatase: 77 U/L (ref 33–130)
Bilirubin, Direct: 0.1 mg/dL (ref <=0.2)
Indirect Bilirubin: 0.5 mg/dL (ref 0.2–1.2)
Total Bilirubin: 0.6 mg/dL (ref 0.2–1.2)
Total Protein: 6.8 g/dL (ref 6.1–8.1)

## 2016-05-31 LAB — URINALYSIS, ROUTINE W REFLEX MICROSCOPIC
Bilirubin Urine: NEGATIVE
Glucose, UA: NEGATIVE
Hgb urine dipstick: NEGATIVE
Ketones, ur: NEGATIVE
Leukocytes, UA: NEGATIVE
Nitrite: NEGATIVE
Protein, ur: NEGATIVE
Specific Gravity, Urine: 1.005 (ref 1.001–1.035)
pH: 6.5 (ref 5.0–8.0)

## 2016-05-31 LAB — CBC WITH DIFFERENTIAL/PLATELET
Basophils Absolute: 0 cells/uL (ref 0–200)
Basophils Relative: 0 %
Eosinophils Absolute: 200 cells/uL (ref 15–500)
Eosinophils Relative: 2 %
HCT: 40.6 % (ref 35.0–45.0)
Hemoglobin: 13.5 g/dL (ref 11.7–15.5)
Lymphocytes Relative: 24 %
Lymphs Abs: 2400 cells/uL (ref 850–3900)
MCH: 31.5 pg (ref 27.0–33.0)
MCHC: 33.3 g/dL (ref 32.0–36.0)
MCV: 94.6 fL (ref 80.0–100.0)
MPV: 11.2 fL (ref 7.5–12.5)
Monocytes Absolute: 500 cells/uL (ref 200–950)
Monocytes Relative: 5 %
Neutro Abs: 6900 cells/uL (ref 1500–7800)
Neutrophils Relative %: 69 %
Platelets: 222 10*3/uL (ref 140–400)
RBC: 4.29 MIL/uL (ref 3.80–5.10)
RDW: 13 % (ref 11.0–15.0)
WBC: 10 10*3/uL (ref 3.8–10.8)

## 2016-05-31 LAB — LIPID PANEL
Cholesterol: 174 mg/dL (ref 125–200)
HDL: 52 mg/dL (ref >=46)
LDL Cholesterol: 101 mg/dL (ref <130)
Total CHOL/HDL Ratio: 3.3 Ratio (ref <=5.0)
Triglycerides: 103 mg/dL (ref <150)
VLDL: 21 mg/dL (ref <30)

## 2016-05-31 LAB — MICROALBUMIN / CREATININE URINE RATIO
Creatinine, Urine: 32 mg/dL (ref 20–320)
Microalb Creat Ratio: 13 mcg/mg creat (ref <30)
Microalb, Ur: 0.4 mg/dL

## 2016-05-31 LAB — HEMOGLOBIN A1C
Hgb A1c MFr Bld: 5.4 % (ref <5.7)
Mean Plasma Glucose: 108 mg/dL

## 2016-05-31 NOTE — Patient Instructions (Addendum)
Cologuard is an easy to use noninvasive colon cancer screening test based on the latest advances in stool DNA science.   Colon cancer is 3rd most diagnosed cancer and 2nd leading cause of death in both men and women 55 years of age and older despite being one of the most preventable and treatable cancers if found early.  4 of out 5 people diagnosed with colon cancer have NO prior family history.  When caught EARLY 90% of colon cancer is curable.   You have agreed to do a Cologuard screening and have declined a colonoscopy in spite of being explained the risks and benefits of the colonoscopy in detail, including cancer and death. Please understand that this is test not as sensitive or specific as a colonoscopy and you are still recommended to get a colonoscopy.   If you are NOT medicare please call your insurance company and given them this CPT code, [phone], in order to see how much your insurance company will cover Or you can call [phone] to talk with Cologuard about pricing and coverage.  Out-of-pocket cost for Cologuard can range from $0 - $649 so please call  You will receive a short call from [place] support center at [employer], when you receive a call they will say they are from [place],  to confirm your mailing address and give you more information.  When they calll you, it will appear on the caller ID as "[employer]" or in some cases only this number will appear, [phone].   [employer] will ship your collection kit directly to you. You will collect a single stool sample in the privacy of your own home, no special preparation required. You will return the kit via [place] pre-paid shipping or pick-up, in the same box it arrived in. Then I will contact you to discuss your results after I receive them from the laboratory.   If you have any questions or concerns, Cologuard Customer Support Specialist are available 24 hours a day, 7 days  a week at [phone] or go to [email or web address].    If you have a smart phone, please look up Smoke Free app, this will help you stay on track and give you information about money you have saved, life that you have gained back and a ton of more information.   We are giving you chantix for smoking cessation. You can do it! And we are here to help! You may have heard some scary side effects about chantix, the three most common I hear about are nausea, crazy dreams and depression.  However, I like for my patients to try to stay on 1/2 a tablet twice a day rather than one tablet twice a day as normally prescribed. This helps decrease the chances of side effects and helps save money by making a one month prescription last two months  Please start the prescription this way:  Start 1/2 tablet by mouth once daily after food with a full glass of water for 3 days Then do 1/2 tablet by mouth twice daily for 4 days. During this first week you can smoke, but try to stop after this week.  At this point we have several options: 1) continue on 1/2 tablet twice a day- which I encourage you to do. You can stay on this dose the rest of the time on the medication or if you still feel the need to smoke you can do one of the two options below. 2) do one  tablet in the morning and 1/2 in the evening which helps decrease dreams. 3) do one tablet twice a day.   What if I miss a dose? If you miss a dose, take it as soon as you can. If it is almost time for your next dose, take only that dose. Do not take double or extra doses.  What should I watch for while using this medicine? Visit your doctor or health care professional for regular check ups. Ask for ongoing advice and encouragement from your doctor or healthcare professional, friends, and family to help you quit. If you smoke while on this medication, quit again  Your mouth may get dry. Chewing sugarless gum or hard candy, and drinking plenty of water may  help. Contact your doctor if the problem does not go away or is severe.  You may get drowsy or dizzy. Do not drive, use machinery, or do anything that needs mental alertness until you know how this medicine affects you. Do not stand or sit up quickly, especially if you are an older patient.   The use of this medicine may increase the chance of suicidal thoughts or actions. Pay special attention to how you are responding while on this medicine. Any worsening of mood, or thoughts of suicide or dying should be reported to your health care professional right away.  ADVANTAGES OF QUITTING SMOKING  Within 20 minutes, blood pressure decreases. Your pulse is at normal level.  After 8 hours, carbon monoxide levels in the blood return to normal. Your oxygen level increases.  After 24 hours, the chance of having a heart attack starts to decrease. Your breath, hair, and body stop smelling like smoke.  After 48 hours, damaged nerve endings begin to recover. Your sense of taste and smell improve.  After 72 hours, the body is virtually free of nicotine. Your bronchial tubes relax and breathing becomes easier.  After 2 to 12 weeks, lungs can hold more air. Exercise becomes easier and circulation improves.  After 1 year, the risk of coronary heart disease is cut in half.  After 5 years, the risk of stroke falls to the same as a nonsmoker.  After 10 years, the risk of lung cancer is cut in half and the risk of other cancers decreases significantly.  After 15 years, the risk of coronary heart disease drops, usually to the level of a nonsmoker.  You will have extra money to spend on things other than cigarettes.

## 2016-05-31 NOTE — Progress Notes (Signed)
Complete Physical  Assessment and Plan:  Hyperlipidemia -continue medications, check lipids, decrease fatty foods, increase activity.  -     CBC with Differential/Platelet -     BASIC METABOLIC PANEL WITH GFR -     Hepatic function panel -     TSH -     Lipid panel -     EKG 12-Lead  Tobacco dependence Smoking cessation-  instruction/counseling given, counseled patient on the dangers of tobacco use, advised patient to stop smoking, and reviewed strategies to maximize success -     EKG 12-Lead  Anemia, unspecified anemia type -     Iron and TIBC -     Ferritin  Vitamin D deficiency -     VITAMIN D 25 Hydroxy (Vit-D Deficiency, Fractures)  Hx of migraines Avoid triggers  Noncompliance Noncompliance- emphasized compliance with medications and appointments, patient expressed understanding  Routine general medical examination at a health care facility Counseled on need for colonoscopy, will consider cologuard/getting colonoscopy -     CBC with Differential/Platelet -     BASIC METABOLIC PANEL WITH GFR -     Hepatic function panel -     TSH -     Lipid panel -     Hemoglobin A1c -     VITAMIN D 25 Hydroxy (Vit-D Deficiency, Fractures) -     Urinalysis, Routine w reflex microscopic (not at [facility]) -     Microalbumin / creatinine urine ratio -     Iron and TIBC -     Ferritin -     EKG 12-Lead  Medication management  Asthma, unspecified asthma severity, uncomplicated Stop smoking, avoid triggers, albuterol PRN  Other abnormal glucose -     Hemoglobin A1c  Screening for blood or protein in urine -     Urinalysis, Routine w reflex microscopic (not at [facility]) -     Microalbumin / creatinine urine ratio   Discussed med's effects and SE's. Screening labs and tests as requested with regular follow-up as recommended. Over 40 minutes of exam, counseling, chart review, and complex, high level critical decision making was performed this visit.   HPI  55 y.o. female  presents  for a complete physical and follow up for has Hyperlipidemia; Anemia; Vitamin D deficiency; Hx of migraines; Tobacco dependence; Noncompliance; Hair loss; and Asthma on her problem list..  Her blood pressure has been controlled at home, today their BP is BP: 108/64 She does workout, will go to gym with 31 year old son occ, will have fatigue with it. She denies chest pain, shortness of breath, dizziness.  She has history of asthma, controlled at this time, she does continue to smoke, normal CXR 2017, going to try to start with an electronic cig and wants to quit completely.  She is not on cholesterol medication and denies myalgias. Her cholesterol is at goal. The cholesterol last visit was:   Lab Results  Component Value Date   CHOL 169 12/01/2015   HDL 42 (L) 12/01/2015   LDLCALC 102 12/01/2015   TRIG 126 12/01/2015   CHOLHDL 4.0 12/01/2015   She has been working on diet and exercise for prediabetes,  and denies paresthesia of the feet, polydipsia, polyuria and visual disturbances. Last A1C in the office was:  Lab Results  Component Value Date   HGBA1C 5.7 (H) 12/08/2014   Patient is on Vitamin D supplement, has been taking daily.   Lab Results  Component Value Date   VD25OH 35 12/01/2015  BMI is Body mass index is 21.47 kg/m., she is working on diet and exercise. Wt Readings from Last 3 Encounters:  05/31/16 129 lb (58.5 kg)  03/10/16 120 lb (54.4 kg)  12/01/15 132 lb 6.4 oz (60.1 kg)    Current Medications:  Current Outpatient Prescriptions on File Prior to Visit  Medication Sig Dispense Refill  . albuterol (PROVENTIL HFA;VENTOLIN HFA) 108 (90 Base) MCG/ACT inhaler Inhale 2 puffs into the lungs every 6 (six) hours as needed for wheezing or shortness of breath (cough). 1 Inhaler 0  . ALPRAZolam (XANAX) 1 MG tablet Take 1/2 to 1 tablet 2 x / day if needed for anxiety 180 tablet 0  . aspirin 81 MG chewable tablet Chew by mouth daily.     No current facility-administered  medications on file prior to visit.    Allergies:  Allergies  Allergen Reactions  . Biaxin [Clarithromycin] Nausea Only   Medical History:  She has Hyperlipidemia; Anemia; Vitamin D deficiency; Hx of migraines; Tobacco dependence; Noncompliance; Hair loss; and Asthma on her problem list.   Health Maintenance:   Immunization History  Administered Date(s) Administered  . PPD Test 12/08/2014  . Pneumococcal Polysaccharide-23 10/31/1996  . Td 10/31/2009   Tetanus: 2011 Pneumovax: 1998 Prevnar 13: due age 49 Flu vaccine: declines Zostavax: N/A  LMP: before 2014 Pap: 2016 at GYN MGM: 2016 at GYN  DEXA: N/A Colonoscopy: declines EGD: N/A CXR 2017  Last Dental Exam:OVER DUE Last Eye Exam: 2015, wears glasses Patient Care Team: [name], MD as PCP - General (Internal Medicine) Francene Boyers, MD as Referring Physician (Optometry) Ilda Mori, MD as Consulting Physician (Obstetrics and Gynecology) Arminda Resides, MD as Consulting Physician (Dermatology)  Surgical History:  She has a past surgical history that includes Tubal ligation and Breast surgery. Family History:  Herfamily history includes Diabetes in her father; Heart disease in her father; Hypertension in her father; Kidney disease in her father. Social History:  She reports that she has been smoking.  She does not have any smokeless tobacco history on file.  Review of Systems: Review of Systems  Constitutional: Negative.   HENT: Negative.   Eyes: Negative.   Respiratory: Negative.   Cardiovascular: Negative.   Gastrointestinal: Negative.   Genitourinary: Negative.   Musculoskeletal: Negative.   Skin: Negative.   Neurological: Negative.   Endo/Heme/Allergies: Negative.   Psychiatric/Behavioral: Negative.     Physical Exam: Estimated body mass index is 21.47 kg/m as calculated from the following:   Height as of this encounter:  (1.651 m).   Weight as of this encounter: 129 lb (58.5  kg). BP 108/64   Pulse 77   Temp 97.7 F (36.5 C) (Temporal)   Resp 14   Ht  (1.651 m)   Wt 129 lb (58.5 kg)   SpO2 95%   BMI 21.47 kg/m  General Appearance: Well nourished, in no apparent distress.  Eyes: PERRLA, EOMs, conjunctiva no swelling or erythema, normal fundi and vessels.  Sinuses: No Frontal/maxillary tenderness  ENT/Mouth: Ext aud canals clear, normal light reflex with TMs without erythema, bulging. Good dentition. No erythema, swelling, or exudate on post pharynx. Tonsils not swollen or erythematous. Hearing normal.  Neck: Supple, thyroid normal. No bruits  Respiratory: Respiratory effort normal, BS equal bilaterally without rales, rhonchi, wheezing or stridor.  Cardio: RRR without murmurs, rubs or gallops. Brisk peripheral pulses without edema.  Chest: symmetric, with normal excursions and percussion.  Breasts: def GYN Abdomen: Soft, nontender, no guarding, rebound,  hernias, masses, or organomegaly.  Lymphatics: Non tender without lymphadenopathy.  Genitourinary: defer GYN Musculoskeletal: Full ROM all peripheral extremities,5/5 strength, and normal gait.  Skin: Warm, dry without rashes, lesions, ecchymosis. Neuro: Cranial nerves intact, reflexes equal bilaterally. Normal muscle tone, no cerebellar symptoms. Sensation intact.  Psych: Awake and oriented X 3, normal affect, Insight and Judgment appropriate.   EKG: WNL no ST changes. AORTA SCAN: defer  Quentin Mulling 9:14 AM [facility]

## 2016-06-01 LAB — FERRITIN: Ferritin: 127 ng/mL (ref 10–232)

## 2016-06-01 LAB — VITAMIN D 25 HYDROXY (VIT D DEFICIENCY, FRACTURES): Vit D, 25-Hydroxy: 57 ng/mL (ref 30–100)

## 2016-10-06 ENCOUNTER — Telehealth: Payer: Self-pay

## 2016-10-06 ENCOUNTER — Other Ambulatory Visit: Payer: Self-pay | Admitting: Physician Assistant

## 2016-10-06 MED ORDER — ALPRAZOLAM 1 MG PO TABS: ORAL_TABLET | ORAL | 1 refills | Status: DC

## 2016-10-06 NOTE — Telephone Encounter (Signed)
Xanax was called into [facility] @3 :30pm

## 2016-12-02 ENCOUNTER — Ambulatory Visit (INDEPENDENT_AMBULATORY_CARE_PROVIDER_SITE_OTHER): Payer: BLUE CROSS/BLUE SHIELD | Admitting: Physician Assistant

## 2016-12-02 ENCOUNTER — Encounter: Payer: Self-pay | Admitting: Physician Assistant

## 2016-12-02 VITALS — BP 110/80 | HR 71 | Temp 97.7°F | Resp 14 | Ht 65.0 in | Wt 134.8 lb

## 2016-12-02 DIAGNOSIS — E785 Hyperlipidemia, unspecified: Secondary | ICD-10-CM | POA: Diagnosis not present

## 2016-12-02 DIAGNOSIS — E559 Vitamin D deficiency, unspecified: Secondary | ICD-10-CM | POA: Diagnosis not present

## 2016-12-02 DIAGNOSIS — F17200 Nicotine dependence, unspecified, uncomplicated: Secondary | ICD-10-CM

## 2016-12-02 DIAGNOSIS — D649 Anemia, unspecified: Secondary | ICD-10-CM

## 2016-12-02 DIAGNOSIS — Z79899 Other long term (current) drug therapy: Secondary | ICD-10-CM

## 2016-12-02 MED ORDER — ALPRAZOLAM 1 MG PO TABS: ORAL_TABLET | ORAL | 1 refills | Status: DC

## 2016-12-02 NOTE — Patient Instructions (Addendum)
Muscle aches Magnesium low add 250-400 mg with food to prevent diarrhea. Magnesium may help with muscle cramps, constipation, vitamin D and potassium absorption.  Please make sure you are taking 64 oz of water a day  -Please take Tylenol or Aleve for pain. - STRETCH -You can take tylenol (500mg ) or tylenol arthritis (650mg ). The max you can take of tylenol a day is 3000mg  daily, this is a max of 6 pills a day of the regular tyelnol (500mg ) or a max of 4 a day of the tylenol arthritis (650mg ) as long as no other medications you are taking contain tylenol.  -Aleve/Naproxen Sodium- can take 1-2 in the morning and 1 at night. Max 3 a day. Take with food to avoid ulcers. Does affect your kidney function so use sparingly.    Muscle Pain Muscle pain (myalgia) may be caused by many things, including:  Overuse or muscle strain, especially if you are not in shape. This is the most common cause of muscle pain.  Injury.  Bruises.  Viruses, such as the flu.  Infectious diseases.  Fibromyalgia, which is a chronic condition that causes muscle tenderness, fatigue, and headache.  Autoimmune diseases, including lupus.  Certain drugs, including ACE inhibitors and statins. Muscle pain may be mild or severe. In most cases, the pain lasts only a short time and goes away without treatment. To diagnose the cause of your muscle pain, your health care provider will take your medical history. This means he or she will ask you when your muscle pain began and what has been happening. If you have not had muscle pain for very long, your health care provider may want to wait before doing much testing. If your muscle pain has lasted a long time, your health care provider may want to run tests right away. If your health care provider thinks your muscle pain may be caused by illness, you may need to have additional tests to rule out certain conditions.  Treatment for muscle pain depends on the cause. Home care is often  enough to relieve muscle pain. Your health care provider may also prescribe anti-inflammatory medicine. HOME CARE INSTRUCTIONS Watch your condition for any changes. The following actions may help to lessen any discomfort you are feeling:  Only take over-the-counter or prescription medicines as directed by your health care provider.  Apply ice to the sore muscle:  Put ice in a plastic bag.  Place a towel between your skin and the bag.  Leave the ice on for 15-20 minutes, 3-4 times a day.  You may alternate applying hot and cold packs to the muscle as directed by your health care provider.  If overuse is causing your muscle pain, slow down your activities until the pain goes away.  Remember that it is normal to feel some muscle pain after starting a workout program. Muscles that have not been used often will be sore at first.  Do regular, gentle exercises if you are not usually active.  Warm up before exercising to lower your risk of muscle pain.  Do not continue working out if the pain is very bad. Bad pain could mean you have injured a muscle. SEEK MEDICAL CARE IF:  Your muscle pain gets worse, and medicines do not help.  You have muscle pain that lasts longer than 3 days.  You have a rash or fever along with muscle pain.  You have muscle pain after a tick bite.  You have muscle pain while working out, even though  you are in good physical condition.  You have redness, soreness, or swelling along with muscle pain.  You have muscle pain after starting a new medicine or changing the dose of a medicine. SEEK IMMEDIATE MEDICAL CARE IF:  You have trouble breathing.  You have trouble swallowing.  You have muscle pain along with a stiff neck, fever, and vomiting.  You have severe muscle weakness or cannot move part of your body. MAKE SURE YOU:   Understand these instructions.  Will watch your condition.  Will get help right away if you are not doing well or get  worse. Document Released: 09/08/2006 Document Revised: 10/22/2013 Document Reviewed: 08/13/2013 [facility] Patient Information 2015 [place], [place]. This information is not intended to replace advice given to you by your health care provider. Make sure you discuss any questions you have with your health care provider.  Potassium Content of Foods  The body needs potassium to control blood pressure and to keep the muscles and nervous system healthy. Here are some healthy foods below that are high in potassium. Also you can get the white label salt of "NO SALT" salt substitute, 1/4 teaspoon of this is equivalent to 20meq potassium.   FOODS AND DRINKS HIGH IN POTASSIUM FOODS MODERATE IN POTASSIUM   Fruits  Avocado (cubed),  c / 50 g.  Banana (sliced), 75 g.  Cantaloupe (cubed), 80 g.  Honeydew, 1 wedge / 85 g.  Kiwi (sliced), 90 g.  Nectarine, 1 small / 129 g.  Orange, 1 medium / 131 g. Vegetables  Artichoke,  of a medium / 64 g.  Asparagus (boiled), 90 g..  Broccoli (boiled), 78 g.  Brussels sprout (boiled), 78 g.  Butternut squash (baked), 103 g.  Chickpea (cooked), 82 g.  Green peas (cooked), 80 g.  Kidney beans (cooked), 5 tbsp / 55 g.  Lima beans (cooked),  c / 43 g.  Navy beans (cooked),  c / 61 g.  Spinach (cooked),  c / 45 g.  Sweet potato (baked),  c / 50 g.  Tomato (chopped or sliced), 90 g.  Vegetable juice.  White mushrooms (cooked), 78 g.  Yam (cooked or baked),  c / 34 g.  Zucchini squash (boiled), 90 g. Other Foods and Drinks  Almonds (whole),  c / 36 g.  Fish, 3 oz / 85 g.  Nonfat fruit variety yogurt, 123 g.  Pistachio nuts, 1 oz / 28 g.  Pumpkin seeds, 1 oz / 28 g.  Red meat (broiled, cooked, grilled), 3 oz / 85 g.  Scallops (steamed), 3 oz / 85 g.  Spaghetti sauce,  c / 66 g.  Sunflower seeds (dry roasted), 1 oz / 28 g.  Veggie burger, 1 patty / 70 g. Fruits  Grapefruit,  of the fruit / 123 g  Plums (sliced), 83  g.  Tangerine, 1 large / 120 g. Vegetables  Carrots (boiled), 78 g.  Carrots (sliced), 61 g.  Rhubarb (cooked with sugar), 120 g.  Rutabaga (cooked), 120 g.  Yellow snap beans (cooked), 63 g. Other Foods and Drinks   Chicken breast (roasted and chopped),  c / 70 g.  Pita bread, 1 large / 64 g.  Shrimp (steamed), 4 oz / 113 g.  Swiss cheese (diced), 70 g.

## 2016-12-02 NOTE — Progress Notes (Signed)
Assessment and Plan:   Hyperlipidemia -continue medications, check lipids, decrease fatty foods, increase activity.    Hx of migraines controlled  Leg cramps Increase water, stretch, add mag  Tobacco dependence Patient has quit, only ecig at this time and wants to stop that  WILL GET LABS AT [facility], DOING WELL Continue diet and meds as discussed. Further disposition pending results of labs. Over 30 minutes of exam, counseling, chart review, and critical decision making was performed Future Appointments Date Time Provider Department Center  06/05/2017 9:00 AM Courtney Forcucci, PA-C [facility] None    HPI 56 y.o. female  presents for 3 month follow up on hypertension, cholesterol, prediabetes, and vitamin D deficiency.   Her blood pressure has been controlled at home, today their BP is BP: 110/80  She does not workout. She denies chest pain, shortness of breath, dizziness.  She has history of asthma, has stopped smoking x 1 month and she has noticed a big difference with decrease cough and less SOB.  Patient only complaint is cramping occ in her bilateral legs, foot or calf, only at night, walking makes it better, states she drinks plenty of water.  She take 1/2-1 xanax at night every night.   She is not on cholesterol medication and denies myalgias. Her cholesterol is at goal. The cholesterol last visit was:   Lab Results  Component Value Date   CHOL 174 05/31/2016   HDL 52 05/31/2016   LDLCALC 101 05/31/2016   TRIG 103 05/31/2016   CHOLHDL 3.3 05/31/2016   Patient is on Vitamin D supplement.   Lab Results  Component Value Date   VD25OH 57 05/31/2016     BMI is Body mass index is 22.43 kg/m. Wt Readings from Last 3 Encounters:  12/02/16 134 lb 12.8 oz (61.1 kg)  05/31/16 129 lb (58.5 kg)  03/10/16 120 lb (54.4 kg)    Current Medications:  Current Outpatient Prescriptions on File Prior to Visit  Medication Sig Dispense Refill  . ALPRAZolam (XANAX) 1 MG tablet TAKE 1  TABLET BY MOUTH TWO TIMES DAILY AS NEEDED FOR ANXIETY 60 tablet 1  . aspirin 81 MG chewable tablet Chew by mouth daily.     No current facility-administered medications on file prior to visit.    Medical History:  Past Medical History:  Diagnosis Date  . Anemia   . Asthma, mild intermittent, well-controlled   . GERD (gastroesophageal reflux disease)   . Hx of migraines   . Hyperlipidemia   . Vitamin D deficiency    Allergies:  Allergies  Allergen Reactions  . Biaxin [Clarithromycin] Nausea Only     Review of Systems:  Review of Systems  Constitutional: Negative for chills, diaphoresis, fever, malaise/fatigue and weight loss.  HENT: Negative for congestion, ear discharge, ear pain, hearing loss, nosebleeds, sore throat and tinnitus.   Respiratory: Negative for cough, hemoptysis, shortness of breath, wheezing and stridor.   Cardiovascular: Negative.   Gastrointestinal: Negative for abdominal pain, blood in stool, constipation, diarrhea, heartburn, melena, nausea and vomiting.  Genitourinary: Negative.   Musculoskeletal: Negative for back pain, falls, joint pain, myalgias and neck pain.  Skin: Negative.  Negative for rash.  Neurological: Negative.  Negative for dizziness, weakness and headaches.  Psychiatric/Behavioral: Negative.  Negative for depression.    Family history- Review and unchanged Social history- Review and unchanged Physical Exam: BP 110/80   Pulse 71   Temp 97.7 F (36.5 C)   Resp 14   Ht 5\' 5"  (1.651 m)  Wt 134 lb 12.8 oz (61.1 kg)   SpO2 97%   BMI 22.43 kg/m  Wt Readings from Last 3 Encounters:  12/02/16 134 lb 12.8 oz (61.1 kg)  05/31/16 129 lb (58.5 kg)  03/10/16 120 lb (54.4 kg)   General Appearance: Well nourished, in no apparent distress. Eyes: PERRLA, EOMs, conjunctiva no swelling or erythema Sinuses: + Frontal/maxillary tenderness ENT/Mouth: Ext aud canals clear, TMs without erythema, bulging. No erythema, swelling, or exudate on post  pharynx.  Tonsils not swollen or erythematous. Hearing normal.  Neck: Supple, thyroid normal.  Respiratory: Respiratory effort normal, BS equal bilaterally with mild wheezing without rales, rhonchi, or stridor.  Cardio: RRR with no MRGs. Brisk peripheral pulses without edema.  Abdomen: Soft, + BS,  Non tender, no guarding, rebound, hernias, masses. Lymphatics: Non tender without lymphadenopathy.  Musculoskeletal: Full ROM, 5/5 strength, Normal gait, full ROM back, nontender thoracic spine, no rash. Skin: Warm, dry without rashes, lesions, ecchymosis.  Neuro: Cranial nerves intact. Normal muscle tone, no cerebellar symptoms. Psych: Awake and oriented X 3, normal affect, Insight and Judgment appropriate.    Rebecca Mulling, PA-C 9:18 AM [facility]

## 2017-03-14 ENCOUNTER — Encounter: Payer: Self-pay | Admitting: *Deleted

## 2017-03-28 ENCOUNTER — Encounter: Payer: Self-pay | Admitting: *Deleted

## 2017-04-13 ENCOUNTER — Other Ambulatory Visit: Payer: Self-pay | Admitting: Physician Assistant

## 2017-04-13 NOTE — Telephone Encounter (Signed)
Pleas call Alpraz 

## 2017-06-05 ENCOUNTER — Encounter: Payer: Self-pay | Admitting: Internal Medicine

## 2017-06-15 ENCOUNTER — Ambulatory Visit: Payer: Self-pay | Admitting: Internal Medicine

## 2017-06-15 ENCOUNTER — Other Ambulatory Visit: Payer: Self-pay | Admitting: *Deleted

## 2017-06-15 MED ORDER — ALPRAZOLAM 1 MG PO TABS: ORAL_TABLET | ORAL | 2 refills | Status: DC

## 2017-06-15 NOTE — Progress Notes (Signed)
RESCHEDULED

## 2017-07-28 ENCOUNTER — Ambulatory Visit (INDEPENDENT_AMBULATORY_CARE_PROVIDER_SITE_OTHER): Payer: BLUE CROSS/BLUE SHIELD | Admitting: Internal Medicine

## 2017-07-28 VITALS — BP 122/78 | HR 68 | Temp 97.0°F | Resp 16 | Ht 65.5 in | Wt 146.8 lb

## 2017-07-28 DIAGNOSIS — E559 Vitamin D deficiency, unspecified: Secondary | ICD-10-CM

## 2017-07-28 DIAGNOSIS — Z1212 Encounter for screening for malignant neoplasm of rectum: Secondary | ICD-10-CM

## 2017-07-28 DIAGNOSIS — Z1211 Encounter for screening for malignant neoplasm of colon: Secondary | ICD-10-CM

## 2017-07-28 DIAGNOSIS — R030 Elevated blood-pressure reading, without diagnosis of hypertension: Secondary | ICD-10-CM

## 2017-07-28 DIAGNOSIS — Z136 Encounter for screening for cardiovascular disorders: Secondary | ICD-10-CM | POA: Diagnosis not present

## 2017-07-28 DIAGNOSIS — Z79899 Other long term (current) drug therapy: Secondary | ICD-10-CM | POA: Diagnosis not present

## 2017-07-28 DIAGNOSIS — Z8669 Personal history of other diseases of the nervous system and sense organs: Secondary | ICD-10-CM

## 2017-07-28 DIAGNOSIS — E782 Mixed hyperlipidemia: Secondary | ICD-10-CM

## 2017-07-28 DIAGNOSIS — Z0001 Encounter for general adult medical examination with abnormal findings: Secondary | ICD-10-CM

## 2017-07-28 DIAGNOSIS — Z0000 Encounter for general adult medical examination without abnormal findings: Secondary | ICD-10-CM

## 2017-07-28 DIAGNOSIS — R7309 Other abnormal glucose: Secondary | ICD-10-CM

## 2017-07-28 DIAGNOSIS — R5383 Other fatigue: Secondary | ICD-10-CM

## 2017-07-28 NOTE — Patient Instructions (Signed)

## 2017-07-28 NOTE — Progress Notes (Signed)
[place] [facility] [name], M.D.     Dyanne Carrel. Steffanie Dunn, P.A.-C Judd Gaudier, DNP [facility] [address] [place], [place]. [address] Telephone [phone] Telefax [phone] Annual Screening/Preventative Visit & Comprehensive Evaluation &  Examination     This very nice 56 y.o.MWF presents for a Screening/Preventative Visit & comprehensive evaluation and management of multiple medical co-morbidities.  Patient has been followed expectantly for HTN, Prediabetes, Hyperlipidemia and Vitamin D Deficiency.      Patient's BP has been controlled at home and patient denies any cardiac symptoms as chest pain, palpitations, shortness of breath, dizziness or ankle swelling. Today's BP is at goal - 122/78.      Patient's hyperlipidemia is controlled with diet and medications.  Last lipids were at goal:  Lab Results  Component Value Date   CHOL 174 05/31/2016   HDL 52 05/31/2016   LDLCALC 101 05/31/2016   TRIG 103 05/31/2016   CHOLHDL 3.3 05/31/2016      Patient has prediabetes predating and patient denies reactive hypoglycemic symptoms, visual blurring, diabetic polys, or paresthesias. Last A1c was at goal: Lab Results  Component Value Date   HGBA1C 5.4 05/31/2016      Finally, patient has history of Vitamin D Deficiency ("40" on supplementation in 2014) and last Vitamin D was still not at goal: Lab Results  Component Value Date   VD25OH 57 05/31/2016   Current Outpatient Prescriptions on File Prior to Visit  Medication Sig  . ALPRAZolam (XANAX) 1 MG tablet TAKE ONE TABLET BY MOUTH TWICE A DAY AS NEEDED FOR ANXIETY  . aspirin 81 MG chewable tablet Chew by mouth daily.   No current facility-administered medications on file prior to visit.    Allergies  Allergen Reactions  . Biaxin [Clarithromycin] Nausea Only   Past Medical History:  Diagnosis Date  . Anemia   . Asthma, mild intermittent, well-controlled   .  GERD (gastroesophageal reflux disease)   . Hx of migraines   . Hyperlipidemia   . Vitamin D deficiency    Health Maintenance  Topic Date Due  . Hepatitis C Screening  [birth year]  . HIV Screening  11/01/1975  . PAP SMEAR  11/01/2015  . MAMMOGRAM  03/31/2016  . INFLUENZA VACCINE  05/31/2017  . TETANUS/TDAP  11/01/2019  . COLONOSCOPY  12/14/2024   Immunization History  Administered Date(s) Administered  . PPD Test 12/08/2014  . Pneumococcal Polysaccharide-23 10/31/1996  . Td 10/31/2009   Past Surgical History:  Procedure Laterality Date  . BREAST SURGERY     reduction  . TUBAL LIGATION     Family History  Problem Relation Age of Onset  . Heart disease Father   . Hypertension Father   . Diabetes Father   . Kidney disease Father    Social History  Substance Use Topics  . Smoking status: Current Every Day Smoker    Types: E-cigarettes  . Smokeless tobacco: Current User  . Alcohol use Not on file    ROS Constitutional: Denies fever, chills, weight loss/gain, headaches, insomnia,  night sweats, and change in appetite. Does c/o fatigue. Eyes: Denies redness, blurred vision, diplopia, discharge, itchy, watery eyes.  ENT: Denies discharge, congestion, post nasal drip, epistaxis, sore throat, earache, hearing loss, dental pain, Tinnitus, Vertigo, Sinus pain, snoring.  Cardio: Denies chest pain, palpitations, irregular heartbeat, syncope, dyspnea, diaphoresis, orthopnea, PND, claudication, edema Respiratory: denies cough, dyspnea, DOE, pleurisy, hoarseness, laryngitis, wheezing.  Gastrointestinal: Denies dysphagia, heartburn, reflux, water brash, pain,  cramps, nausea, vomiting, bloating, diarrhea, constipation, hematemesis, melena, hematochezia, jaundice, hemorrhoids Genitourinary: Denies dysuria, frequency, urgency, nocturia, hesitancy, discharge, hematuria, flank pain Breast: Breast lumps, nipple discharge, bleeding.  Musculoskeletal: Denies arthralgia, myalgia, stiffness, Jt.  Swelling, pain, limp, and strain/sprain. Denies falls. Skin: Denies puritis, rash, hives, warts, acne, eczema, changing in skin lesion Neuro: No weakness, tremor, incoordination, spasms, paresthesia, pain Psychiatric: Denies confusion, memory loss, sensory loss. Denies Depression. Endocrine: Denies change in weight, skin, hair change, nocturia, and paresthesia, diabetic polys, visual blurring, hyper / hypo glycemic episodes.  Heme/Lymph: No excessive bleeding, bruising, enlarged lymph nodes.  Physical Exam  BP 122/78   Pulse 68   Temp (!) 97 F (36.1 C)   Resp 16   Ht 5' 5.5" (1.664 m)   Wt 146 lb 12.8 oz (66.6 kg)   BMI 24.06 kg/m   General Appearance: Well nourished, well groomed and in no apparent distress.  Eyes: PERRLA, EOMs, conjunctiva no swelling or erythema, normal fundi and vessels. Sinuses: No frontal/maxillary tenderness ENT/Mouth: EACs patent / TMs  nl. Nares clear without erythema, swelling, mucoid exudates. Oral hygiene is good. No erythema, swelling, or exudate. Tongue normal, non-obstructing. Tonsils not swollen or erythematous. Hearing normal.  Neck: Supple, thyroid normal. No bruits, nodes or JVD. Respiratory: Respiratory effort normal.  BS equal and clear bilateral without rales, rhonci, wheezing or stridor. Cardio: Heart sounds are normal with regular rate and rhythm and no murmurs, rubs or gallops. Peripheral pulses are normal and equal bilaterally without edema. No aortic or femoral bruits. Chest: symmetric with normal excursions and percussion. Breasts: Deferred , done recently by GYN.  Abdomen: Flat, soft with bowel sounds active. Nontender, no guarding, rebound, hernias, masses, or organomegaly.  Lymphatics: Non tender without lymphadenopathy.  Genitourinary:  Musculoskeletal: Full ROM all peripheral extremities, joint stability, 5/5 strength, and normal gait. Skin: Warm and dry without rashes, lesions, cyanosis, clubbing or  ecchymosis.  Neuro: Cranial  nerves intact, reflexes equal bilaterally. Normal muscle tone, no cerebellar symptoms. Sensation intact.  Pysch: Alert and oriented X 3, normal affect, Insight and Judgment appropriate.   Assessment and Plan  1. Annual Preventative Screening Examination  1. Encounter for general adult medical examination with abnormal findings   2. Elevated BP without diagnosis of hypertension  - EKG 12-Lead - Urinalysis, Routine w reflex microscopic - Microalbumin / creatinine urine ratio - CBC with Differential/Platelet - BASIC METABOLIC PANEL WITH GFR - Magnesium - TSH  3. Hyperlipidemia, mixed  - EKG 12-Lead - Hepatic function panel - Lipid panel - TSH  4. Other abnormal glucose  - Hemoglobin A1c - Insulin, random  5. Vitamin D deficiency  6. Hx of migraines   7. Screening for colorectal cancer  - POC Hemoccult Bld/S  8. Screening for ischemic heart disease  - EKG 12-Lead  9. Fatigue  - Iron,Total/Total Iron Binding Cap - Vitamin B12 - CBC with Differential/Platelet  10. Medication management  - Urinalysis, Routine w reflex microscopic - Microalbumin / creatinine urine ratio - CBC with Differential/Platelet - BASIC METABOLIC PANEL WITH GFR - Hepatic function panel - Magnesium - Lipid panel - TSH - Hemoglobin A1c - Insulin, random - VITAMIN D 25 Hydroxy       Patient was counseled in prudent diet to achieve/maintain BMI less than 25 for weight control, BP monitoring, regular exercise and medications. Discussed med's effects and SE's. Screening labs and tests as requested with regular follow-up as recommended. Over 40 minutes of exam, counseling, chart review and high complex critical  decision making was performed.

## 2017-07-29 ENCOUNTER — Encounter: Payer: Self-pay | Admitting: Internal Medicine

## 2017-07-31 LAB — IRON, TOTAL/TOTAL IRON BINDING CAP
%SAT: 22 % (calc) (ref 11–50)
Iron: 76 ug/dL (ref 45–160)
TIBC: 338 mcg/dL (calc) (ref 250–450)

## 2017-07-31 LAB — HEPATIC FUNCTION PANEL
AG Ratio: 1.4 (calc) (ref 1.0–2.5)
ALT: 8 U/L (ref 6–29)
AST: 13 U/L (ref 10–35)
Albumin: 4.2 g/dL (ref 3.6–5.1)
Alkaline phosphatase (APISO): 98 U/L (ref 33–130)
Bilirubin, Direct: 0.1 mg/dL (ref 0.0–0.2)
Globulin: 2.9 g/dL (calc) (ref 1.9–3.7)
Indirect Bilirubin: 0.4 mg/dL (calc) (ref 0.2–1.2)
Total Bilirubin: 0.5 mg/dL (ref 0.2–1.2)
Total Protein: 7.1 g/dL (ref 6.1–8.1)

## 2017-07-31 LAB — URINALYSIS, ROUTINE W REFLEX MICROSCOPIC
Bilirubin Urine: NEGATIVE
Glucose, UA: NEGATIVE
Hgb urine dipstick: NEGATIVE
Ketones, ur: NEGATIVE
Leukocytes, UA: NEGATIVE
Nitrite: NEGATIVE
Protein, ur: NEGATIVE
Specific Gravity, Urine: 1.004 (ref 1.001–1.03)
pH: 8 (ref 5.0–8.0)

## 2017-07-31 LAB — LIPID PANEL
Cholesterol: 194 mg/dL (ref <200)
HDL: 67 mg/dL (ref >50)
LDL Cholesterol (Calc): 107 mg/dL (calc) — ABNORMAL HIGH
Non-HDL Cholesterol (Calc): 127 mg/dL (calc) (ref <130)
Total CHOL/HDL Ratio: 2.9 (calc) (ref <5.0)
Triglycerides: 103 mg/dL (ref <150)

## 2017-07-31 LAB — CBC WITH DIFFERENTIAL/PLATELET
Basophils Absolute: 20 cells/uL (ref 0–200)
Basophils Relative: 0.4 %
Eosinophils Absolute: 110 cells/uL (ref 15–500)
Eosinophils Relative: 2.2 %
HCT: 37.6 % (ref 35.0–45.0)
Hemoglobin: 12.6 g/dL (ref 11.7–15.5)
Lymphs Abs: 1340 cells/uL (ref 850–3900)
MCH: 31.2 pg (ref 27.0–33.0)
MCHC: 33.5 g/dL (ref 32.0–36.0)
MCV: 93.1 fL (ref 80.0–100.0)
MPV: 11.9 fL (ref 7.5–12.5)
Monocytes Relative: 7.3 %
Neutro Abs: 3165 cells/uL (ref 1500–7800)
Neutrophils Relative %: 63.3 %
Platelets: 199 10*3/uL (ref 140–400)
RBC: 4.04 10*6/uL (ref 3.80–5.10)
RDW: 11.8 % (ref 11.0–15.0)
Total Lymphocyte: 26.8 %
WBC mixed population: 365 cells/uL (ref 200–950)
WBC: 5 10*3/uL (ref 3.8–10.8)

## 2017-07-31 LAB — MAGNESIUM: Magnesium: 2.1 mg/dL (ref 1.5–2.5)

## 2017-07-31 LAB — BASIC METABOLIC PANEL WITH GFR
BUN/Creatinine Ratio: 9 (calc) (ref 6–22)
BUN: 6 mg/dL — ABNORMAL LOW (ref 7–25)
CO2: 29 mmol/L (ref 20–32)
Calcium: 9.3 mg/dL (ref 8.6–10.4)
Chloride: 105 mmol/L (ref 98–110)
Creat: 0.69 mg/dL (ref 0.50–1.05)
GFR, Est African American: 113 mL/min/{1.73_m2} (ref >=60)
GFR, Est Non African American: 97 mL/min/{1.73_m2} (ref >=60)
Glucose, Bld: 94 mg/dL (ref 65–99)
Potassium: 4.4 mmol/L (ref 3.5–5.3)
Sodium: 141 mmol/L (ref 135–146)

## 2017-07-31 LAB — HEMOGLOBIN A1C
Hgb A1c MFr Bld: 5.1 % of total Hgb (ref <5.7)
Mean Plasma Glucose: 100 (calc)
eAG (mmol/L): 5.5 (calc)

## 2017-07-31 LAB — VITAMIN D 25 HYDROXY (VIT D DEFICIENCY, FRACTURES): Vit D, 25-Hydroxy: 35 ng/mL (ref 30–100)

## 2017-07-31 LAB — VITAMIN B12: Vitamin B-12: 256 pg/mL (ref 200–1100)

## 2017-07-31 LAB — MICROALBUMIN / CREATININE URINE RATIO
Creatinine, Urine: 19 mg/dL — ABNORMAL LOW (ref 20–275)
Microalb Creat Ratio: 11 mcg/mg creat (ref <30)
Microalb, Ur: 0.2 mg/dL

## 2017-07-31 LAB — INSULIN, RANDOM: Insulin: 8.7 u[IU]/mL (ref 2.0–19.6)

## 2017-07-31 LAB — TSH: TSH: 0.86 mIU/L (ref 0.40–4.50)

## 2017-12-13 ENCOUNTER — Other Ambulatory Visit: Payer: Self-pay | Admitting: Internal Medicine

## 2018-01-28 DIAGNOSIS — Z6824 Body mass index (BMI) 24.0-24.9, adult: Secondary | ICD-10-CM | POA: Insufficient documentation

## 2018-01-28 DIAGNOSIS — R7309 Other abnormal glucose: Secondary | ICD-10-CM | POA: Insufficient documentation

## 2018-01-28 DIAGNOSIS — F419 Anxiety disorder, unspecified: Secondary | ICD-10-CM | POA: Insufficient documentation

## 2018-01-28 NOTE — Progress Notes (Signed)
FOLLOW UP  Assessment and Plan:   Tobacco use Discussed risks associated with tobacco use and advised to reduce or quit - she is no longer smoking cigarettes but continues to vape daily - reports no longer has smoker's cough or AM secretions/recurrent infections Patient is not ready to do so, but advised to consider strongly  Will follow up at the next visit  Cholesterol Currently near goal by lifestyle modification only Continue low cholesterol diet and exercise.  Defer checking lipids to CPE per patient request  Other abnormal glucose Recent A1Cs at goal Discussed diet/exercise, weight management  Defer labs today; recheck at CPE  BMI 24  Continue to recommend diet heavy in fruits and veggies and low in animal meats, cheeses, and dairy products, appropriate calorie intake Discuss exercise recommendations routinely Continue to monitor weight at each visit  Vitamin D Def Below goal at last visit; has newly started supplement since then - 5000 IU daily Dever vit D levels  Anxiety Well managed by current regimen; continue medications; discussed avoiding using xanax daily if possible Stress management techniques discussed, increase water, good sleep hygiene discussed, increase exercise, and increase veggies.   Tailor's bunion of right foot Avoid constrictive socks/shoes, consider padded insoles - consider vionic, regular foot care for callus that is forning  Continue diet and meds as discussed. Further disposition pending results of labs. Discussed med's effects and SE's.   Over 30 minutes of exam, counseling, chart review, and critical decision making was performed.   Future Appointments  Date Time Provider Department Center  08/29/2018 10:00 AM Lucky CowboyMcKeown, William, MD [facility] None    ----------------------------------------------------------------------------------------------------------------------  HPI 57 y.o. female  presents for 6 month follow up on anxiety,  smoking and vitamin D deficiency. She requests we defer all labs today due to insurance costs.   she has a diagnosis of anxiety and is currently on xanax 1 mg BID PRN, reports symptoms are well controlled on current regimen. she reports anxiety is much improved; she reports she typically takes 1 mg at night for sleep, and very rarely takes if she has a severely stressful day at work, or if traveling by air.   she currently continues to vape; discussed risks associated with smoking, patient is not ready to quit.   BMI is Body mass index is 24.09 kg/m., she has not been working on diet and exercise, but plans to start going to the gym with her teenage son. Wt Readings from Last 3 Encounters:  01/29/18 147 lb (66.7 kg)  07/28/17 146 lb 12.8 oz (66.6 kg)  12/02/16 134 lb 12.8 oz (61.1 kg)   Today their BP is BP: 122/76 She denies chest pain, shortness of breath, dizziness.   She is not on cholesterol medication and denies myalgias. Her cholesterol is not at goal. The cholesterol last visit was:   Lab Results  Component Value Date   CHOL 194 07/28/2017   HDL 67 07/28/2017   LDLCALC 107 (H) 07/28/2017   TRIG 103 07/28/2017   CHOLHDL 2.9 07/28/2017    She has been working on diet and exercise for glucose management, and denies foot ulcerations, hyperglycemia, increased appetite, nausea, paresthesia of the feet, polydipsia, polyuria, visual disturbances, vomiting and weight loss. Last A1C in the office was:  Lab Results  Component Value Date   HGBA1C 5.1 07/28/2017   Patient has hx of vitamin D deficiency and has been recommended supplementation - she has started on 5000 IU daily since last visit.  Lab Results  Component Value Date   VD25OH 35 07/28/2017        Current Medications:  Current Outpatient Medications on File Prior to Visit  Medication Sig  . ALPRAZolam (XANAX) 1 MG tablet TAKE ONE TABLET BY MOUTH TWICE A DAY AS NEEDED  . aspirin 81 MG chewable tablet Chew by mouth  daily.   No current facility-administered medications on file prior to visit.      Allergies:  Allergies  Allergen Reactions  . Biaxin [Clarithromycin] Nausea Only     Medical History:  Past Medical History:  Diagnosis Date  . Anemia   . Asthma, mild intermittent, well-controlled   . GERD (gastroesophageal reflux disease)   . Hx of migraines   . Hyperlipidemia   . Vitamin D deficiency    Family history- Reviewed and unchanged Social history- Reviewed and unchanged   Review of Systems:  Review of Systems  Constitutional: Negative for malaise/fatigue and weight loss.  HENT: Negative for hearing loss and tinnitus.   Eyes: Negative for blurred vision and double vision.  Respiratory: Negative for cough, shortness of breath and wheezing.   Cardiovascular: Negative for chest pain, palpitations, orthopnea, claudication and leg swelling.  Gastrointestinal: Negative for abdominal pain, blood in stool, constipation, diarrhea, heartburn, melena, nausea and vomiting.  Genitourinary: Negative.   Musculoskeletal: Negative for joint pain and myalgias.  Skin: Negative for rash.  Neurological: Negative for dizziness, tingling, sensory change, weakness and headaches.  Endo/Heme/Allergies: Negative for polydipsia.  Psychiatric/Behavioral: Negative.   All other systems reviewed and are negative.     Physical Exam: BP 122/76   Pulse 81   Temp 97.7 F (36.5 C)   Ht 5' 5.5" (1.664 m)   Wt 147 lb (66.7 kg)   SpO2 97%   BMI 24.09 kg/m  Wt Readings from Last 3 Encounters:  01/29/18 147 lb (66.7 kg)  07/28/17 146 lb 12.8 oz (66.6 kg)  12/02/16 134 lb 12.8 oz (61.1 kg)   General Appearance: Well nourished, in no apparent distress. Eyes: PERRLA, EOMs, conjunctiva no swelling or erythema Sinuses: No Frontal/maxillary tenderness ENT/Mouth: Ext aud canals clear, TMs without erythema, bulging. No erythema, swelling, or exudate on post pharynx.  Tonsils not swollen or erythematous.  Hearing normal.  Neck: Supple, thyroid normal.  Respiratory: Respiratory effort normal, BS equal bilaterally without rales, rhonchi, wheezing or stridor.  Cardio: RRR with no MRGs. Brisk peripheral pulses without edema.  Abdomen: Soft, + BS.  Non tender, no guarding, rebound, hernias, masses. Lymphatics: Non tender without lymphadenopathy.  Musculoskeletal: Full ROM, 5/5 strength, Normal gait Skin: Warm, dry without rashes, lesions, ecchymosis. She has some thickening of base of right 5th toe with mild tenderness to palpation on sole.  Neuro: Cranial nerves intact. No cerebellar symptoms.  Psych: Awake and oriented X 3, normal affect, Insight and Judgment appropriate.    Dan Maker, NP 4:08 PM [facility]

## 2018-01-29 ENCOUNTER — Ambulatory Visit (INDEPENDENT_AMBULATORY_CARE_PROVIDER_SITE_OTHER): Payer: BLUE CROSS/BLUE SHIELD | Admitting: Adult Health

## 2018-01-29 ENCOUNTER — Encounter: Payer: Self-pay | Admitting: Adult Health

## 2018-01-29 VITALS — BP 122/76 | HR 81 | Temp 97.7°F | Ht 65.5 in | Wt 147.0 lb

## 2018-01-29 DIAGNOSIS — Z6824 Body mass index (BMI) 24.0-24.9, adult: Secondary | ICD-10-CM | POA: Diagnosis not present

## 2018-01-29 DIAGNOSIS — Z79899 Other long term (current) drug therapy: Secondary | ICD-10-CM

## 2018-01-29 DIAGNOSIS — M21621 Bunionette of right foot: Secondary | ICD-10-CM

## 2018-01-29 DIAGNOSIS — E559 Vitamin D deficiency, unspecified: Secondary | ICD-10-CM

## 2018-01-29 DIAGNOSIS — R7309 Other abnormal glucose: Secondary | ICD-10-CM

## 2018-01-29 DIAGNOSIS — E785 Hyperlipidemia, unspecified: Secondary | ICD-10-CM | POA: Diagnosis not present

## 2018-01-29 DIAGNOSIS — D649 Anemia, unspecified: Secondary | ICD-10-CM | POA: Diagnosis not present

## 2018-01-29 DIAGNOSIS — F17200 Nicotine dependence, unspecified, uncomplicated: Secondary | ICD-10-CM

## 2018-01-29 DIAGNOSIS — F419 Anxiety disorder, unspecified: Secondary | ICD-10-CM

## 2018-01-29 NOTE — Patient Instructions (Signed)
Look into collage peptide supplement - take for 3-6 months to really see a difference    Bunion A bunion is a bump on the base of the big toe that forms when the bones of the big toe joint move out of position. Bunions may be small at first, but they often get larger over time. The can make walking painful. What are the causes? A bunion may be caused by:  Wearing narrow or pointed shoes that force the big toe to press against the other toes.  Abnormal foot development that causes the foot to roll inward (pronate).  Changes in the foot that are caused by certain diseases, such as rheumatoid arthritis and polio.  A foot injury.  What increases the risk? The following factors may make you more likely to develop this condition:  Wearing shoes that squeeze the toes together.  Having certain diseases, such as: ? Rheumatoid arthritis. ? Polio. ? Cerebral palsy.  Having family members who have bunions.  Being born with a foot deformity, such as flat feet or low arches.  Doing activities that put a lot of pressure on the feet, such as ballet dancing.  What are the signs or symptoms? The main symptom of a bunion is a noticeable bump on the big toe. Other symptoms may include:  Pain.  Swelling around the big toe.  Redness and inflammation.  Thick or hardened skin on the big toe or between the toes.  Stiffness or loss of motion in the big toe.  Trouble with walking.  How is this diagnosed? A bunion may be diagnosed based on your symptoms, medical history, and activities. You may have tests, such as:  X-rays. These allow your health care provider to check the position of the bones in your foot and look for damage to your joint. They also help your health care provider to determine the severity of your bunion and the best way to treat it.  Joint aspiration. In this test, a sample of fluid is removed from the toe joint. This test, which may be done if you are in a lot of pain,  helps to rule out diseases that cause painful swelling of the joints, such as arthritis.  How is this treated? There is no cure for a bunion, but treatment can help to prevent a bunion from getting worse. Treatment depends on the severity of your symptoms. Your health care provider may recommend:  Wearing shoes that have a wide toe box.  Using bunion pads to cushion the affected area.  Taping your toes together to keep them in a normal position.  Placing a device inside your shoe (orthotics) to help reduce pressure on your toe joint.  Taking medicine to ease pain, inflammation, and swelling.  Applying heat or ice to the affected area.  Doing stretching exercises.  Surgery to remove scar tissue and move the toes back into their normal position. This treatment is rare.  Follow these instructions at home:  Support your toe joint with proper footwear, shoe padding, or taping as told by your health care provider.  Take over-the-counter and prescription medicines only as told by your health care provider.  If directed, apply ice to the injured area: ? Put ice in a plastic bag. ? Place a towel between your skin and the bag. ? Leave the ice on for 20 minutes, 2-3 times per day.  If directed, apply heat to the affected area before you exercise. Use the heat source that your health care  provider recommends, such as a moist heat pack or a heating pad. ? Place a towel between your skin and the heat source. ? Leave the heat on for 20-30 minutes. ? Remove the heat if your skin turns bright red. This is especially important if you are unable to feel pain, heat, or cold. You may have a greater risk of getting burned.  Do exercises as told by your health care provider.  Keep all follow-up visits as told by your health care provider. Contact a health care provider if:  Your symptoms get worse.  Your symptoms do not improve in 2 weeks. Get help right away if:  You have severe pain and  trouble with walking. This information is not intended to replace advice given to you by your health care provider. Make sure you discuss any questions you have with your health care provider. Document Released: 10/17/2005 Document Revised: 03/24/2016 Document Reviewed: 05/17/2015 Elsevier Interactive Patient Education  [employer].

## 2018-04-10 ENCOUNTER — Other Ambulatory Visit: Payer: Self-pay | Admitting: Physician Assistant

## 2018-05-14 ENCOUNTER — Encounter: Payer: Self-pay | Admitting: Adult Health

## 2018-05-14 ENCOUNTER — Ambulatory Visit (INDEPENDENT_AMBULATORY_CARE_PROVIDER_SITE_OTHER): Payer: BLUE CROSS/BLUE SHIELD | Admitting: Adult Health

## 2018-05-14 VITALS — BP 140/82 | HR 84 | Temp 98.1°F | Ht 65.5 in | Wt 154.0 lb

## 2018-05-14 DIAGNOSIS — R1013 Epigastric pain: Secondary | ICD-10-CM | POA: Diagnosis not present

## 2018-05-14 MED ORDER — RANITIDINE HCL 150 MG PO TABS: 150.0000 mg | ORAL_TABLET | Freq: Two times a day (BID) | ORAL | 1 refills | Status: DC

## 2018-05-14 MED ORDER — SUCRALFATE 1 G PO TABS: 1.0000 g | ORAL_TABLET | Freq: Four times a day (QID) | ORAL | 1 refills | Status: DC

## 2018-05-14 NOTE — Progress Notes (Signed)
Assessment and Plan:  Luster LandsbergRenee was seen today for heartburn.  Diagnoses and all orders for this visit:  Epigastric burning sensation GERD vs ulcer vs gallbladder - labs and meds for GERD and evaluate progress in 2 days or so Strong ER precautions given, return for h. hypori testing if suggested -     CBC with Differential/Platelet -     COMPLETE METABOLIC PANEL WITH GFR -     Lipase -     Urinalysis w microscopic + reflex cultur -     Amylase -     ranitidine (ZANTAC) 150 MG tablet; Take 1 tablet (150 mg total) by mouth 2 (two) times daily. -     sucralfate (CARAFATE) 1 g tablet; Take 1 tablet (1 g total) by mouth 4 (four) times daily. As needed for stomach pain. Can dissolve in 4 ounces of water and drink if needed.  Further disposition pending results of labs. Discussed med's effects and SE's.   Over 15 minutes of exam, counseling, chart review, and critical decision making was performed.   Future Appointments  Date Time Provider Department Center  08/29/2018 10:00 AM Judd Gaudierorbett, Wyatte Dames, NP [facility] None   ------------------------------------------------------------------------------------------------------------------  HPI BP 140/82   Pulse 84   Temp 98.1 F (36.7 C)   Ht 5' 5.5" (1.664 m)   Wt 154 lb (69.9 kg)   SpO2 97%   BMI 25.24 kg/m   57 y.o.female presents for evaluation of upper abdominal burning sensation ongoing since noon yesterday; she reports this began around noon yesterday, reports she did not have any lunch, reports sensation of epigastric/retrosternal burning while driving home from her son's house. She reports burning 8/10, constant,  She reports not improved by different positions, thought about going to ER but was able to fall asleep and woke up with pain somewhat improved. She reports pain was gone this AM, but began today around noon. Also endorses some generalized abdominal fullness/bloated sensation.  She has tried tums and gas-x without improvement,  postitioning didn't help.   She reports she had egg and cheese biscuit for breakfast yesterday and today.  She does have hx of GERD though reports she has never had to take more than tums for this. Has never had imaging or EGD.  She has had a tubal ligation 2003. No other surgeries.   Past Medical History:  Diagnosis Date  . Anemia   . Asthma, mild intermittent, well-controlled   . GERD (gastroesophageal reflux disease)   . Hx of migraines   . Hyperlipidemia   . Vitamin D deficiency      Allergies  Allergen Reactions  . Biaxin [Clarithromycin] Nausea Only    Current Outpatient Medications on File Prior to Visit  Medication Sig  . ALPRAZolam (XANAX) 1 MG tablet Take 1/2 to 1 tablet 1 to 2 x / day ONLY if needed for Anxiety Attack & please try to limit to 5 days /week to avoid addiction  . aspirin 81 MG chewable tablet Chew by mouth daily.  . Cholecalciferol 5000 units CHEW Chew 1 capsule by mouth daily.   No current facility-administered medications on file prior to visit.     ROS: Review of Systems  Constitutional: Negative for chills, diaphoresis, fever, malaise/fatigue and weight loss.  HENT: Negative.   Eyes: Negative.   Respiratory: Negative for cough, sputum production, shortness of breath and wheezing.   Cardiovascular: Positive for chest pain. Negative for palpitations, orthopnea, claudication, leg swelling and PND.  Gastrointestinal: Positive for abdominal pain and  heartburn. Negative for blood in stool, constipation, diarrhea, melena, nausea and vomiting.  Musculoskeletal: Negative for back pain, joint pain, myalgias and neck pain.  Skin: Negative for rash.  Neurological: Negative for dizziness and headaches.     Physical Exam:  BP 140/82   Pulse 84   Temp 98.1 F (36.7 C)   Ht 5' 5.5" (1.664 m)   Wt 154 lb (69.9 kg)   SpO2 97%   BMI 25.24 kg/m   General Appearance: Well nourished, in no apparent distress. Eyes: PERRLA, EOMs, conjunctiva no swelling  or erythema Sinuses: No Frontal/maxillary tenderness ENT/Mouth: Ext aud canals clear, TMs without erythema, bulging. No erythema, swelling, or exudate on post pharynx.  Tonsils not swollen or erythematous. Hearing normal.  Neck: Supple, thyroid normal.  Respiratory: Respiratory effort normal, BS equal bilaterally without rales, rhonchi, wheezing or stridor.  Cardio: RRR with no MRGs. Brisk peripheral pulses without edema.  Abdomen: Soft, + BS.  Genralized tenderness, worst in RUQ, no guarding, rebound, hernias, masses. Lymphatics: Non tender without lymphadenopathy.  Musculoskeletal: Full ROM, symmetrical strength, normal gait.  Skin: Warm, dry without rashes, lesions, ecchymosis.  Neuro: Cranial nerves intact. Normal muscle tone, no cerebellar symptoms. Sensation intact.  Psych: Awake and oriented X 3, normal affect, Insight and Judgment appropriate.     Dan Maker, NP 2:30 PM [facility] Adoles[facility]

## 2018-05-14 NOTE — Patient Instructions (Addendum)
Start ranitidine 150 mg twice daily - can increase up to 300 mg if needed  Message me and let me know how you're doing in a few days   Also sent in carafate - this medication you can take as needed up to 4 times a day - will coat your stomach and make a protective lining   Can also dissolve 1 tab in 4 oz of water and drink for burning pain extending up into your chest  Recommend following a low- fat, bland diet for a while   Gastroesophageal Reflux Disease, Adult Normally, food travels down the esophagus and stays in the stomach to be digested. If a person has gastroesophageal reflux disease (GERD), food and stomach acid move back up into the esophagus. When this happens, the esophagus becomes sore and swollen (inflamed). Over time, GERD can make small holes (ulcers) in the lining of the esophagus. Follow these instructions at home: Diet  Follow a diet as told by your doctor. You may need to avoid foods and drinks such as: ? Coffee and tea (with or without caffeine). ? Drinks that contain alcohol. ? Energy drinks and sports drinks. ? Carbonated drinks or sodas. ? Chocolate and cocoa. ? Peppermint and mint flavorings. ? Garlic and onions. ? Horseradish. ? Spicy and acidic foods, such as peppers, chili powder, curry powder, vinegar, hot sauces, and BBQ sauce. ? Citrus fruit juices and citrus fruits, such as oranges, lemons, and limes. ? Tomato-based foods, such as red sauce, chili, salsa, and pizza with red sauce. ? Fried and fatty foods, such as donuts, french fries, potato chips, and high-fat dressings. ? High-fat meats, such as hot dogs, rib eye steak, sausage, ham, and bacon. ? High-fat dairy items, such as whole milk, butter, and cream cheese.  Eat small meals often. Avoid eating large meals.  Avoid drinking large amounts of liquid with your meals.  Avoid eating meals during the 2-3 hours before bedtime.  Avoid lying down right after you eat.  Do not exercise right after  you eat. General instructions  Pay attention to any changes in your symptoms.  Take over-the-counter and prescription medicines only as told by your doctor. Do not take aspirin, ibuprofen, or other NSAIDs unless your doctor says it is okay.  Do not use any tobacco products, including cigarettes, chewing tobacco, and e-cigarettes. If you need help quitting, ask your doctor.  Wear loose clothes. Do not wear anything tight around your waist.  Raise (elevate) the head of your bed about 6 inches (15 cm).  Try to lower your stress. If you need help doing this, ask your doctor.  If you are overweight, lose an amount of weight that is healthy for you. Ask your doctor about a safe weight loss goal.  Keep all follow-up visits as told by your doctor. This is important. Contact a doctor if:  You have new symptoms.  You lose weight and you do not know why it is happening.  You have trouble swallowing, or it hurts to swallow.  You have wheezing or a cough that keeps happening.  Your symptoms do not get better with treatment.  You have a hoarse voice. Get help right away if:  You have pain in your arms, neck, jaw, teeth, or back.  You feel sweaty, dizzy, or light-headed.  You have chest pain or shortness of breath.  You throw up (vomit) and your throw up looks like blood or coffee grounds.  You pass out (faint).  Your poop (stool)  is bloody or black.  You cannot swallow, drink, or eat. This information is not intended to replace advice given to you by your health care provider. Make sure you discuss any questions you have with your health care provider. Document Released: 04/04/2008 Document Revised: 03/24/2016 Document Reviewed: 02/11/2015 Elsevier Interactive Patient Education  [employer].

## 2018-05-16 LAB — AMYLASE: Amylase: 25 U/L (ref 21–101)

## 2018-05-16 LAB — URINALYSIS W MICROSCOPIC + REFLEX CULTURE
Bacteria, UA: NONE SEEN /HPF
Bilirubin Urine: NEGATIVE
Glucose, UA: NEGATIVE
Hgb urine dipstick: NEGATIVE
Hyaline Cast: NONE SEEN /LPF
Ketones, ur: NEGATIVE
Nitrites, Initial: NEGATIVE
Protein, ur: NEGATIVE
RBC / HPF: NONE SEEN /HPF (ref 0–2)
Specific Gravity, Urine: 1.009 (ref 1.001–1.03)
Squamous Epithelial / LPF: NONE SEEN /HPF (ref <=5)
WBC, UA: NONE SEEN /HPF (ref 0–5)
pH: 6.5 (ref 5.0–8.0)

## 2018-05-16 LAB — COMPLETE METABOLIC PANEL WITH GFR
AG Ratio: 1.6 (calc) (ref 1.0–2.5)
ALT: 9 U/L (ref 6–29)
AST: 13 U/L (ref 10–35)
Albumin: 4.5 g/dL (ref 3.6–5.1)
Alkaline phosphatase (APISO): 95 U/L (ref 33–130)
BUN: 10 mg/dL (ref 7–25)
CO2: 31 mmol/L (ref 20–32)
Calcium: 10.3 mg/dL (ref 8.6–10.4)
Chloride: 102 mmol/L (ref 98–110)
Creat: 0.86 mg/dL (ref 0.50–1.05)
GFR, Est African American: 87 mL/min/{1.73_m2} (ref >=60)
GFR, Est Non African American: 75 mL/min/{1.73_m2} (ref >=60)
Globulin: 2.9 g/dL (calc) (ref 1.9–3.7)
Glucose, Bld: 110 mg/dL — ABNORMAL HIGH (ref 65–99)
Potassium: 4.7 mmol/L (ref 3.5–5.3)
Sodium: 142 mmol/L (ref 135–146)
Total Bilirubin: 0.4 mg/dL (ref 0.2–1.2)
Total Protein: 7.4 g/dL (ref 6.1–8.1)

## 2018-05-16 LAB — CBC WITH DIFFERENTIAL/PLATELET
Basophils Absolute: 16 cells/uL (ref 0–200)
Basophils Relative: 0.2 %
Eosinophils Absolute: 168 cells/uL (ref 15–500)
Eosinophils Relative: 2.1 %
HCT: 39.5 % (ref 35.0–45.0)
Hemoglobin: 13.3 g/dL (ref 11.7–15.5)
Lymphs Abs: 1200 cells/uL (ref 850–3900)
MCH: 31.2 pg (ref 27.0–33.0)
MCHC: 33.7 g/dL (ref 32.0–36.0)
MCV: 92.7 fL (ref 80.0–100.0)
MPV: 11.9 fL (ref 7.5–12.5)
Monocytes Relative: 7.7 %
Neutro Abs: 6000 cells/uL (ref 1500–7800)
Neutrophils Relative %: 75 %
Platelets: 206 10*3/uL (ref 140–400)
RBC: 4.26 10*6/uL (ref 3.80–5.10)
RDW: 11.8 % (ref 11.0–15.0)
Total Lymphocyte: 15 %
WBC mixed population: 616 cells/uL (ref 200–950)
WBC: 8 10*3/uL (ref 3.8–10.8)

## 2018-05-16 LAB — URINE CULTURE
MICRO NUMBER:: 90840295
SPECIMEN QUALITY:: ADEQUATE

## 2018-05-16 LAB — LIPASE: Lipase: 14 U/L (ref 7–60)

## 2018-05-16 LAB — CULTURE INDICATED

## 2018-06-27 ENCOUNTER — Other Ambulatory Visit: Payer: Self-pay | Admitting: Internal Medicine

## 2018-06-27 DIAGNOSIS — F419 Anxiety disorder, unspecified: Secondary | ICD-10-CM

## 2018-06-27 MED ORDER — ALPRAZOLAM 1 MG PO TABS: ORAL_TABLET | ORAL | 0 refills | Status: DC

## 2018-07-18 ENCOUNTER — Encounter: Payer: Self-pay | Admitting: Internal Medicine

## 2018-08-29 ENCOUNTER — Encounter: Payer: Self-pay | Admitting: Internal Medicine

## 2018-08-29 ENCOUNTER — Ambulatory Visit: Payer: Self-pay | Admitting: Physician Assistant

## 2018-08-29 ENCOUNTER — Encounter: Payer: Self-pay | Admitting: Adult Health

## 2018-08-29 ENCOUNTER — Other Ambulatory Visit: Payer: Self-pay | Admitting: Physician Assistant

## 2018-08-29 DIAGNOSIS — F419 Anxiety disorder, unspecified: Secondary | ICD-10-CM

## 2018-08-29 MED ORDER — ALPRAZOLAM 1 MG PO TABS: ORAL_TABLET | ORAL | 0 refills | Status: DC

## 2018-08-29 NOTE — Progress Notes (Signed)
Complete Physical  Assessment and Plan:  Hyperlipidemia -continue medications, check lipids, decrease fatty foods, increase activity.  -     CBC with Differential/Platelet -     BASIC METABOLIC PANEL WITH GFR -     Hepatic function panel -     TSH -     Lipid panel -     EKG 12-Lead  Tobacco dependence Smoking cessation-  instruction/counseling given, counseled patient on the dangers of tobacco use, advised patient to stop smoking, and reviewed strategies to maximize success -     EKG 12-Lead  Anemia, unspecified anemia type -     Iron and TIBC -     Ferritin  Vitamin D deficiency -     VITAMIN D 25 Hydroxy (Vit-D Deficiency, Fractures)  Hx of migraines Avoid triggers  Noncompliance Noncompliance- emphasized compliance with medications and appointments, patient expressed understanding  Routine general medical examination at a health care facility Counseled on need for colonoscopy, will consider cologuard/getting colonoscopy -     CBC with Differential/Platelet -     BASIC METABOLIC PANEL WITH GFR -     Hepatic function panel -     TSH -     Lipid panel -     Hemoglobin A1c -     VITAMIN D 25 Hydroxy (Vit-D Deficiency, Fractures) -     Urinalysis, Routine w reflex microscopic (not at [facility]) -     Microalbumin / creatinine urine ratio -     Iron and TIBC -     Ferritin -     EKG 12-Lead  Medication management  Asthma, unspecified asthma severity, uncomplicated Stop smoking, avoid triggers, albuterol PRN  Other abnormal glucose -     Hemoglobin A1c  Screening for blood or protein in urine -     Urinalysis, Routine w reflex microscopic (not at [facility]) -     Microalbumin / creatinine urine ratio   Discussed med's effects and SE's. Screening labs and tests as requested with regular follow-up as recommended. Over 40 minutes of exam, counseling, chart review, and complex, high level critical decision making was performed this visit.   HPI  57 y.o. female  presents  for a complete physical and follow up for has Hyperlipidemia; Anemia; Vitamin D deficiency; Hx of migraines; Tobacco dependence; Noncompliance; Asthma; Other abnormal glucose; and Anxiety on their problem list..  Her blood pressure has been controlled at home, today their BP is   She does workout, will go to gym with 43 year old son occ, will have fatigue with it. She denies chest pain, shortness of breath, dizziness.  She has history of asthma, controlled at this time, she does continue to smoke, normal CXR 2017, going to try to start with an electronic cig and wants to quit completely.  She is not on cholesterol medication and denies myalgias. Her cholesterol is at goal. The cholesterol last visit was:   Lab Results  Component Value Date   CHOL 194 07/28/2017   HDL 67 07/28/2017   LDLCALC 107 (H) 07/28/2017   TRIG 103 07/28/2017   CHOLHDL 2.9 07/28/2017   She has been working on diet and exercise for prediabetes,  and denies paresthesia of the feet, polydipsia, polyuria and visual disturbances. Last A1C in the office was:  Lab Results  Component Value Date   HGBA1C 5.1 07/28/2017   Patient is on Vitamin D supplement, has been taking daily.   Lab Results  Component Value Date   VD25OH 35 07/28/2017  BMI is There is no height or weight on file to calculate BMI., she is working on diet and exercise. Wt Readings from Last 3 Encounters:  05/14/18 154 lb (69.9 kg)  01/29/18 147 lb (66.7 kg)  07/28/17 146 lb 12.8 oz (66.6 kg)    Current Medications:  Current Outpatient Medications on File Prior to Visit  Medication Sig Dispense Refill  . ALPRAZolam (XANAX) 1 MG tablet Take 1/2 to 1 tablet 1 to 2 x / day ONLY if needed for Anxiety Attack & please try to limit to 5 days /week to avoid addiction 60 tablet 0  . aspirin 81 MG chewable tablet Chew by mouth daily.    . Cholecalciferol 5000 units CHEW Chew 1 capsule by mouth daily.    . ranitidine (ZANTAC) 150 MG tablet Take 1 tablet (150  mg total) by mouth 2 (two) times daily. 60 tablet 1  . sucralfate (CARAFATE) 1 g tablet Take 1 tablet (1 g total) by mouth 4 (four) times daily. As needed for stomach pain. Can dissolve in 4 ounces of water and drink if needed. 120 tablet 1   No current facility-administered medications on file prior to visit.    Allergies:  Allergies  Allergen Reactions  . Biaxin [Clarithromycin] Nausea Only   Medical History:  She has Hyperlipidemia; Anemia; Vitamin D deficiency; Hx of migraines; Tobacco dependence; Noncompliance; Asthma; Other abnormal glucose; and Anxiety on their problem list.   Health Maintenance:   Immunization History  Administered Date(s) Administered  . PPD Test 12/08/2014  . Pneumococcal Polysaccharide-23 10/31/1996  . Td 10/31/2009   Tetanus: 2011 Pneumovax: 1998 Prevnar 13: due age 13 Flu vaccine: declines Zostavax: N/A  LMP: before 2014 Pap: 2016 at GYN MGM: 2016 at GYN  DEXA: N/A Colonoscopy: declines EGD: N/A CXR 2017  Last Dental Exam:OVER DUE Last Eye Exam: 2015, wears glasses Patient Care Team: [name], MD as PCP - General (Internal Medicine) Hanley Seamen Dustin Folks, MD as Referring Physician (Optometry) Ilda Mori, MD as Consulting Physician (Obstetrics and Gynecology) Arminda Resides, MD as Consulting Physician (Dermatology)  Surgical History:  She has a past surgical history that includes Tubal ligation and Breast surgery. Family History:  Herfamily history includes Diabetes in her father; Heart disease in her father; Hypertension in her father; Kidney disease in her father. Social History:  She reports that she has been smoking e-cigarettes. She has never used smokeless tobacco.  Review of Systems: Review of Systems  Constitutional: Negative.   HENT: Negative.   Eyes: Negative.   Respiratory: Negative.   Cardiovascular: Negative.   Gastrointestinal: Negative.   Genitourinary: Negative.   Musculoskeletal: Negative.   Skin:  Negative.   Neurological: Negative.   Endo/Heme/Allergies: Negative.   Psychiatric/Behavioral: Negative.     Physical Exam: Estimated body mass index is 25.24 kg/m as calculated from the following:   Height as of 05/14/18: 5' 5.5" (1.664 m).   Weight as of 05/14/18: 154 lb (69.9 kg). There were no vitals taken for this visit. General Appearance: Well nourished, in no apparent distress.  Eyes: PERRLA, EOMs, conjunctiva no swelling or erythema, normal fundi and vessels.  Sinuses: No Frontal/maxillary tenderness  ENT/Mouth: Ext aud canals clear, normal light reflex with TMs without erythema, bulging. Good dentition. No erythema, swelling, or exudate on post pharynx. Tonsils not swollen or erythematous. Hearing normal.  Neck: Supple, thyroid normal. No bruits  Respiratory: Respiratory effort normal, BS equal bilaterally without rales, rhonchi, wheezing or stridor.  Cardio: RRR without murmurs, rubs or  gallops. Brisk peripheral pulses without edema.  Chest: symmetric, with normal excursions and percussion.  Breasts: def GYN Abdomen: Soft, nontender, no guarding, rebound, hernias, masses, or organomegaly.  Lymphatics: Non tender without lymphadenopathy.  Genitourinary: defer GYN Musculoskeletal: Full ROM all peripheral extremities,5/5 strength, and normal gait.  Skin: Warm, dry without rashes, lesions, ecchymosis. Neuro: Cranial nerves intact, reflexes equal bilaterally. Normal muscle tone, no cerebellar symptoms. Sensation intact.  Psych: Awake and oriented X 3, normal affect, Insight and Judgment appropriate.   EKG: WNL no ST changes. AORTA SCAN: defer  Quentin Mulling 5:35 AM [facility]

## 2018-09-20 ENCOUNTER — Encounter: Payer: Self-pay | Admitting: Physician Assistant

## 2018-11-22 ENCOUNTER — Telehealth: Payer: Self-pay | Admitting: Physician Assistant

## 2018-11-22 DIAGNOSIS — F419 Anxiety disorder, unspecified: Secondary | ICD-10-CM

## 2018-11-22 MED ORDER — ALPRAZOLAM 1 MG PO TABS: ORAL_TABLET | ORAL | 0 refills | Status: DC

## 2018-11-22 NOTE — Telephone Encounter (Signed)
-----   Message from Gregery NaAngela D Duff, CMA sent at 11/21/2018 10:58 AM EST ----- Regarding: REFILL Per pt/Yellow note:   Refill on XANAX Please & Thank You!   FYI[facility]

## 2018-11-23 ENCOUNTER — Telehealth: Payer: Self-pay | Admitting: Physician Assistant

## 2018-11-23 NOTE — Telephone Encounter (Signed)
-----   Message from Gregery NaAngela D Duff, CMA sent at 11/22/2018  1:51 PM EST ----- Regarding: med refill Per pt/Yellow note:   Refill on XANAX Please & Thank You!   [facility]

## 2018-11-26 ENCOUNTER — Telehealth: Payer: Self-pay

## 2018-11-26 NOTE — Telephone Encounter (Signed)
LVM informing patient that Rx was sent on the 23rd of Jan 2020.

## 2018-11-26 NOTE — Telephone Encounter (Signed)
-----   Message from Quentin Mulling, [place] sent at 11/23/2018 12:59 PM EST ----- Regarding: RE: med refill Was sent in 01/23 ----- Message ----- From: Gregery Na, CMA Sent: 11/22/2018   1:51 PM EST To: Quentin Mulling, PA-C Subject: med refill                                     Per pt/Yellow note:   Refill on XANAX Please & Thank You!   FYI: [facility]:  [facility]

## 2019-01-14 ENCOUNTER — Other Ambulatory Visit: Payer: Self-pay

## 2019-01-14 DIAGNOSIS — F419 Anxiety disorder, unspecified: Secondary | ICD-10-CM

## 2019-01-14 NOTE — Telephone Encounter (Signed)
Alprazolam refill request. Last prescription written on 11/22/18. Last office visit on 08/29/18, next office visit on 10/01/19. In que for review.

## 2019-01-15 MED ORDER — ALPRAZOLAM 1 MG PO TABS: ORAL_TABLET | ORAL | 0 refills | Status: DC

## 2019-03-18 ENCOUNTER — Other Ambulatory Visit: Payer: Self-pay | Admitting: Adult Health

## 2019-03-18 ENCOUNTER — Telehealth: Payer: Self-pay

## 2019-03-18 DIAGNOSIS — F419 Anxiety disorder, unspecified: Secondary | ICD-10-CM

## 2019-03-18 MED ORDER — ALPRAZOLAM 1 MG PO TABS: ORAL_TABLET | ORAL | 0 refills | Status: DC

## 2019-03-18 NOTE — Telephone Encounter (Signed)
Refill request for Xanax. Last written on 01/15/19. Last office visit was on 08/29/18, no future appointments scheduled.

## 2019-05-23 ENCOUNTER — Other Ambulatory Visit: Payer: Self-pay

## 2019-05-23 DIAGNOSIS — F419 Anxiety disorder, unspecified: Secondary | ICD-10-CM

## 2019-05-23 NOTE — Telephone Encounter (Signed)
Refill request for Xanax. In que for review.

## 2019-05-24 MED ORDER — ALPRAZOLAM 1 MG PO TABS: ORAL_TABLET | ORAL | 0 refills | Status: DC

## 2019-06-11 ENCOUNTER — Encounter: Payer: Self-pay | Admitting: Adult Health Nurse Practitioner

## 2019-06-11 ENCOUNTER — Ambulatory Visit (INDEPENDENT_AMBULATORY_CARE_PROVIDER_SITE_OTHER): Payer: BC Managed Care – PPO | Admitting: Adult Health Nurse Practitioner

## 2019-06-11 ENCOUNTER — Other Ambulatory Visit: Payer: Self-pay

## 2019-06-11 VITALS — BP 118/68 | HR 92 | Temp 97.3°F | Wt 157.0 lb

## 2019-06-11 DIAGNOSIS — K581 Irritable bowel syndrome with constipation: Secondary | ICD-10-CM | POA: Diagnosis not present

## 2019-06-11 DIAGNOSIS — K5909 Other constipation: Secondary | ICD-10-CM | POA: Diagnosis not present

## 2019-06-11 NOTE — Progress Notes (Signed)
Assessment and Plan:  Rebecca Ochoa was seen today for abdominal pain.  Diagnoses and all orders for this visit:  Other constipation -Take Miralax daily Discussed food high in fiber Provided education about diet and exercise modifications Really need to change diet, large contributor to symptoms  Irritable bowel syndrome with constipation Discussed daily pro/pre biotic  Discussed importance of bowel regiment and hydration Continue to monitor symptoms  Discussed hospital precautions Call or return with new or worsening symptoms.  Further disposition pending results of labs. Discussed med's effects and SE's.   Over 30 minutes of exam, counseling, chart review, and critical decision making was performed.   Future Appointments  Date Time Provider Department Center  10/01/2019  3:00 PM Quentin Mullingollier, Amanda, PA-C [facility] None    ------------------------------------------------------------------------------------------------------------------   HPI 58 y.o.female presents for evaluation of lower abdominal pain for past three days.  She reports that it started three days ago on Saturday.  She had two bowel movements but had to strain but they were both formed and soft.  She still felt constipated and that evening she had pain in the left lower quadrants.  She felt like she had to pass gas but she barely passed any.  She had an episode of lower abdominal heaviness when standing and on Sunday she had some.   Reports that Saturday she took Miralax for one dose.  She did not want to take any more as she was not sure what was going on.   She has had two other episodes like this the last was four months ago. Reports Rebecca Ochoa he is [id number] and he had to have an emergency cecal volvulus and just released from the hospital two days ago.  So she has had extra stress in life related to this and COVID19.  She is not working at this time and reports she is not as active as she should be and has gained weight since  COVID19 restrictions.  She does not participate in regular exercise.  She reports that she drink 3, 16oz bottles of water a day.  She has had a decreased appetite since Saturday and reports that she has a terrible diet for example the other day she had a donut for breakfast and one slice a pizza for lunch.  Denies any nausea, vomiting, dysuria, vaginal discharge or bleeding.    Past Medical History:  Diagnosis Date  . Anemia   . Asthma, mild intermittent, well-controlled   . GERD (gastroesophageal reflux disease)   . Hx of migraines   . Hyperlipidemia   . Vitamin D deficiency      Allergies  Allergen Reactions  . Biaxin [Clarithromycin] Nausea Only    Current Outpatient Medications on File Prior to Visit  Medication Sig  . ALPRAZolam (XANAX) 1 MG tablet Take 1/2 to 1 tablet 1 to 2 x / day ONLY if needed for Anxiety Attack & please try to limit to 5 days /week to avoid addiction  . aspirin 81 MG chewable tablet Chew by mouth daily.  . Cholecalciferol 5000 units CHEW Chew 1 capsule by mouth daily.   No current facility-administered medications on file prior to visit.     ROS: all negative except above.   Physical Exam:  BP 118/68   Pulse 92   Temp (!) 97.3 F (36.3 C)   Wt 157 lb (71.2 kg)   SpO2 93%   BMI 25.73 kg/m   General Appearance: Well nourished, in no apparent distress. Eyes: PERRLA, EOMs, conjunctiva no swelling  or erythema Sinuses: No Frontal/maxillary tenderness ENT/Mouth: Ext aud canals clear, TMs without erythema, bulging. No erythema, swelling, or exudate on post pharynx.  Tonsils not swollen or erythematous. Hearing normal.  Neck: Supple, thyroid normal.  Respiratory: Respiratory effort normal, BS equal bilaterally without rales, rhonchi, wheezing or stridor.  Cardio: RRR with no MRGs. Brisk peripheral pulses without edema.  Abdomen: Soft, hyperactive BS, tender all lower quadrants,  Non tender upper quadrants, no guarding, rebound, hernias,  masses. Lymphatics: Non tender without lymphadenopathy.  Musculoskeletal: Full ROM, 5/5 strength, normal gait.  Skin: Warm, dry without rashes, lesions, ecchymosis.  Neuro: Cranial nerves intact. Normal muscle tone, no cerebellar symptoms. Sensation intact.  Psych: Awake and oriented X 3, normal affect, Insight and Judgment appropriate.     Garnet Sierras, NP 4:50 PM [facility]

## 2019-06-11 NOTE — Patient Instructions (Addendum)
Today you are being treated for IBS-C   Get and over the counter ProBiotic, Digetive Advantage and Align are two examples.  Take this every day.   Constipation  Take the Miralax once a day, monitor your bowel movements.  If your stool is too soft or going too often reduce to every other day or every third day.  Other options:  Colace, Docusate Sodium  Metamucil or Citracal   If you are severely constipated get Milk of Magnesia and put into small amount of warm prune juice, (Nurse special)  IF you have sever cramps please let us know we can consider using bentyl to decrease the cramping.  Adjust you diet increase foods high in fiber.   Fiber Content in Foods  See the following list for the dietary fiber content of some common foods. High-fiber foods High-fiber foods contain 4 grams or more (4g or more) of fiber per serving. They include:  Artichoke (fresh) - 1 medium has 10.3g of fiber.  Baked beans, plain or vegetarian (canned) -  cup has 5.2g of fiber.  Blackberries or raspberries (fresh) -  cup has 4g of fiber.  Bran cereal -  cup has 8.6g of fiber.  Bulgur (cooked) -  cup has 4g of fiber.  Kidney beans (canned) -  cup has 6.8g of fiber.  Lentils (cooked) -  cup has 7.8g of fiber.  Pear (fresh) - 1 medium has 5.1g of fiber.  Peas (frozen) -  cup has 4.4g of fiber.  Pinto beans (canned) -  cup has 5.5g of fiber.  Pinto beans (dried and cooked) -  cup has 7.7g of fiber.  Potato with skin (baked) - 1 medium has 4.4g of fiber.  Quinoa (cooked) -  cup has 5g of fiber.  Soybeans (canned, frozen, or fresh) -  cup has 5.1g of fiber. Moderate-fiber foods Moderate-fiber foods contain 1-4 grams (1-4g) of fiber per serving. They include:  Almonds - 1 oz. has 3.5g of fiber.  Apple with skin - 1 medium has 3.3g of fiber.  Applesauce, sweetened -  cup has 1.5g of fiber.  Bagel, plain - one 4-inch (10-cm) bagel has 2g of fiber.  Banana - 1 medium has  3.1g of fiber.  Broccoli (cooked) -  cup has 2.5g of fiber.  Carrots (cooked) -  cup has 2.3g of fiber.  Corn (canned or frozen) -  cup has 2.1g of fiber.  Corn tortilla - one 6-inch (15-cm) tortilla has 1.5g of fiber.  Green beans (canned) -  cup has 2g of fiber.  Instant oatmeal -  cup has about 2g of fiber.  Long-grain brown rice (cooked) - 1 cup has 3.5g of fiber.  Macaroni, enriched (cooked) - 1 cup has 2.5g of fiber.  Melon - 1 cup has 1.4g of fiber.  Multigrain cereal -  cup has about 2-4g of fiber.  Orange - 1 small has 3.1g of fiber.  Potatoes, mashed -  cup has 1.6g of fiber.  Raisins - 1/4 cup has 1.6g of fiber.  Squash -  cup has 2.9g of fiber.  Sunflower seeds -  cup has 1.1g of fiber.  Tomato - 1 medium has 1.5g of fiber.  Vegetable or soy patty - 1 has 3.4g of fiber.  Whole-wheat bread - 1 slice has 2g of fiber.  Whole-wheat spaghetti -  cup has 3.2g of fiber. Low-fiber foods Low-fiber foods contain less than 1 gram (less than 1g) of fiber per serving. They include:  Egg - 1  large.  Flour tortilla - one 6-inch (15-cm) tortilla.  Fruit juice -  cup.  Lettuce - 1 cup.  Meat, poultry, or fish - 1 oz.  Milk - 1 cup.  Spinach (raw) - 1 cup.  White bread - 1 slice.  White rice -  cup.  Yogurt -  cup. Actual amounts of fiber in foods may be different depending on processing. Talk with your dietitian about how much fiber you need in your diet. This information is not intended to replace advice given to you by your health care provider. Make sure you discuss any questions you have with your health care provider. Document Released: 03/05/2007 Document Revised: 06/09/2016 Document Reviewed: 12/10/2015 Elsevier Patient Education  2020 [employer].

## 2019-07-15 ENCOUNTER — Other Ambulatory Visit: Payer: Self-pay

## 2019-07-15 DIAGNOSIS — F419 Anxiety disorder, unspecified: Secondary | ICD-10-CM

## 2019-07-17 MED ORDER — ALPRAZOLAM 1 MG PO TABS: ORAL_TABLET | ORAL | 0 refills | Status: DC

## 2019-09-03 ENCOUNTER — Encounter: Payer: Self-pay | Admitting: Physician Assistant

## 2019-09-04 ENCOUNTER — Other Ambulatory Visit: Payer: Self-pay

## 2019-09-04 DIAGNOSIS — F419 Anxiety disorder, unspecified: Secondary | ICD-10-CM

## 2019-09-05 ENCOUNTER — Other Ambulatory Visit: Payer: Self-pay | Admitting: Adult Health Nurse Practitioner

## 2019-09-05 MED ORDER — ALPRAZOLAM 1 MG PO TABS: ORAL_TABLET | ORAL | 0 refills | Status: DC

## 2019-09-30 NOTE — Patient Instructions (Addendum)
We will have lab results in 1-3 days.  Follow up in 6 months for follow up and pap, cervical cancer screening.   HOW TO SCHEDULE Mammogram [facility]  7 a.m.-6:30 p.m., Monday 7 a.m.-5 p.m., Tuesday-Friday Schedule an appointment by calling [phone].       Vit D  & Vit C 1,000 mg   are recommended to help protect  against the Covid-19 and other Corona viruses.    Also it's recommended  to take  Zinc 50 mg  to help  protect against the Covid-19   and best place to get  is also on [email or web address][email or web address]  and don't pay more than 6-8 cents /pill !  ================================ Coronavirus (COVID-19) Are you at risk?  Are you at risk for the Coronavirus (COVID-19)?  To be considered HIGH RISK for Coronavirus (COVID-19), you have to meet the following criteria:  . Traveled to [place], [place], [place], [place] or [place]; or in the [place] to [place], [place], [place]  . or [place]; and have fever, cough, and shortness of breath within the last 2 weeks of travel OR . Been in close contact with a person diagnosed with COVID-19 within the last 2 weeks and have  . fever, cough,and shortness of breath .  . IF YOU DO NOT MEET THESE CRITERIA, YOU ARE CONSIDERED LOW RISK FOR COVID-19.  What to do if you are HIGH RISK for COVID-19?  Marland Kitchen If you are having a medical emergency, call 911. . Seek medical care right away. Before you go to a doctor's office, urgent care or emergency department, .  call ahead and tell them about your recent travel, contact with someone diagnosed with COVID-19  .  and your symptoms.  . You should receive instructions from your physician's office regarding next steps of care.  . When you arrive at healthcare provider, tell the healthcare staff immediately you have returned from  . visiting [place], [place], [place], [place] or [place]; or traveled in the [place] to [place], [place],  . [place] or [place] in  the last two weeks or you have been in close contact with a person diagnosed with  . COVID-19 in the last 2 weeks.   . Tell the health care staff about your symptoms: fever, cough and shortness of breath. . After you have been seen by a medical provider, you will be either: o Tested for (COVID-19) and discharged home on quarantine except to seek medical care if  o symptoms worsen, and asked to  - Stay home and avoid contact with others until you get your results (4-5 days)  - Avoid travel on public transportation if possible (such as bus, train, or airplane) or o Sent to the Emergency Department by EMS for evaluation, COVID-19 testing  and  o possible admission depending on your condition and test results.  What to do if you are LOW RISK for COVID-19?  Reduce your risk of any infection by using the same precautions used for avoiding the common cold or flu:  Marland Kitchen Wash your hands often with soap and warm water for at least 20 seconds.  If soap and water are not readily available,  . use an alcohol-based hand sanitizer with at least 60% alcohol.  . If coughing or sneezing, cover your mouth and nose by coughing or sneezing into the elbow areas of your shirt or coat, .  into a tissue or into your sleeve (not  your hands). . Avoid shaking hands with others and consider head nods or verbal greetings only. . Avoid touching your eyes, nose, or mouth with unwashed hands.  . Avoid close contact with people who are sick. . Avoid places or events with large numbers of people in one location, like concerts or sporting events. . Carefully consider travel plans you have or are making. . If you are planning any travel outside or inside the [place], visit the CDC's Travelers' Health webpage for the latest health notices. . If you have some symptoms but not all symptoms, continue to monitor at home and seek medical attention  . if your symptoms worsen. . If you are having a medical emergency, call  911. >>>>>>>>>>>>>>>>>>>>>>> Preventive Care for Adults  A healthy lifestyle and preventive care can promote health and wellness. Preventive health guidelines for women include the following key practices.  A routine yearly physical is a good way to check with your health care provider about your health and preventive screening. It is a chance to share any concerns and updates on your health and to receive a thorough exam.  Visit your dentist for a routine exam and preventive care every 6 months. Brush your teeth twice a day and floss once a day. Good oral hygiene prevents tooth decay and gum disease.  The frequency of eye exams is based on your age, health, family medical history, use of contact lenses, and other factors. Follow your health care provider's recommendations for frequency of eye exams.  Eat a healthy diet. Foods like vegetables, fruits, whole grains, low-fat dairy products, and lean protein foods contain the nutrients you need without too many calories. Decrease your intake of foods high in solid fats, added sugars, and salt. Eat the right amount of calories for you. Get information about a proper diet from your health care provider, if necessary.  Regular physical exercise is one of the most important things you can do for your health. Most adults should get at least 150 minutes of moderate-intensity exercise (any activity that increases your heart rate and causes you to sweat) each week. In addition, most adults need muscle-strengthening exercises on 2 or more days a week.  Maintain a healthy weight. The body mass index (BMI) is a screening tool to identify possible weight problems. It provides an estimate of body fat based on height and weight. Your health care provider can find your BMI and can help you achieve or maintain a healthy weight. For adults 20 years and older:  A BMI below 18.5 is considered underweight.  A BMI of 18.5 to 24.9 is normal.  A BMI of 25 to 29.9 is  considered overweight.  A BMI of 30 and above is considered obese.  Maintain normal blood lipids and cholesterol levels by exercising and minimizing your intake of saturated fat. Eat a balanced diet with plenty of fruit and vegetables. Blood tests for lipids and cholesterol should begin at age 9 and be repeated every 5 years. If your lipid or cholesterol levels are high, you are over 50, or you are at high risk for heart disease, you may need your cholesterol levels checked more frequently. Ongoing high lipid and cholesterol levels should be treated with medicines if diet and exercise are not working.  If you smoke, find out from your health care provider how to quit. If you do not use tobacco, do not start.  Lung cancer screening is recommended for adults aged 13-80 years who are at high risk  for developing lung cancer because of a history of smoking. A yearly low-dose CT scan of the lungs is recommended for people who have at least a 30-pack-year history of smoking and are a current smoker or have quit within the past 15 years. A pack year of smoking is smoking an average of 1 pack of cigarettes a day for 1 year (for example: 1 pack a day for 30 years or 2 packs a day for 15 years). Yearly screening should continue until the smoker has stopped smoking for at least 15 years. Yearly screening should be stopped for people who develop a health problem that would prevent them from having lung cancer treatment.  High blood pressure causes heart disease and increases the risk of stroke. Your blood pressure should be checked at least every 1 to 2 years. Ongoing high blood pressure should be treated with medicines if weight loss and exercise do not work.  If you are 9-10 years old, ask your health care provider if you should take aspirin to prevent strokes.  Diabetes screening involves taking a blood sample to check your fasting blood sugar level. This should be done once every 3 years, after age 54, if you  are within normal weight and without risk factors for diabetes. Testing should be considered at a younger age or be carried out more frequently if you are overweight and have at least 1 risk factor for diabetes.  Breast cancer screening is essential preventive care for women. You should practice "breast self-awareness." This means understanding the normal appearance and feel of your breasts and may include breast self-examination. Any changes detected, no matter how small, should be reported to a health care provider. Women in their 65s and 30s should have a clinical breast exam (CBE) by a health care provider as part of a regular health exam every 1 to 3 years. After age 8, women should have a CBE every year. Starting at age 32, women should consider having a mammogram (breast X-ray test) every year. Women who have a family history of breast cancer should talk to their health care provider about genetic screening. Women at a high risk of breast cancer should talk to their health care providers about having an MRI and a mammogram every year.  Breast cancer gene (BRCA)-related cancer risk assessment is recommended for women who have family members with BRCA-related cancers. BRCA-related cancers include breast, ovarian, tubal, and peritoneal cancers. Having family members with these cancers may be associated with an increased risk for harmful changes (mutations) in the breast cancer genes BRCA1 and BRCA2. Results of the assessment will determine the need for genetic counseling and BRCA1 and BRCA2 testing.  Routine pelvic exams to screen for cancer are no longer recommended for nonpregnant women who are considered low risk for cancer of the pelvic organs (ovaries, uterus, and vagina) and who do not have symptoms. Ask your health care provider if a screening pelvic exam is right for you.  If you have had past treatment for cervical cancer or a condition that could lead to cancer, you need Pap tests and  screening for cancer for at least 20 years after your treatment. If Pap tests have been discontinued, your risk factors (such as having a new sexual partner) need to be reassessed to determine if screening should be resumed. Some women have medical problems that increase the chance of getting cervical cancer. In these cases, your health care provider may recommend more frequent screening and Pap tests.  Colorectal  cancer can be detected and often prevented. Most routine colorectal cancer screening begins at the age of 27 years and continues through age 54 years. However, your health care provider may recommend screening at an earlier age if you have risk factors for colon cancer. On a yearly basis, your health care provider may provide home test kits to check for hidden blood in the stool. Use of a small camera at the end of a tube, to directly examine the colon (sigmoidoscopy or colonoscopy), can detect the earliest forms of colorectal cancer. Talk to your health care provider about this at age 61, when routine screening begins.  Direct exam of the colon should be repeated every 5-10 years through age 68 years, unless early forms of pre-cancerous polyps or small growths are found.  Hepatitis C blood testing is recommended for all people born from [birth year] through 1965 and any individual with known risks for hepatitis C.  Pra  Osteoporosis is a disease in which the bones lose minerals and strength with aging. This can result in serious bone fractures or breaks. The risk of osteoporosis can be identified using a bone density scan. Women ages 55 years and over and women at risk for fractures or osteoporosis should discuss screening with their health care providers. Ask your health care provider whether you should take a calcium supplement or vitamin D to reduce the rate of osteoporosis.  Menopause can be associated with physical symptoms and risks. Hormone replacement therapy is available to decrease symptoms  and risks. You should talk to your health care provider about whether hormone replacement therapy is right for you.  Use sunscreen. Apply sunscreen liberally and repeatedly throughout the day. You should seek shade when your shadow is shorter than you. Protect yourself by wearing long sleeves, pants, a wide-brimmed hat, and sunglasses year round, whenever you are outdoors.  Once a month, do a whole body skin exam, using a mirror to look at the skin on your back. Tell your health care provider of new moles, moles that have irregular borders, moles that are larger than a pencil eraser, or moles that have changed in shape or color.  Stay current with required vaccines (immunizations).  Influenza vaccine. All adults should be immunized every year.  Tetanus, diphtheria, and acellular pertussis (Td, Tdap) vaccine. Pregnant women should receive 1 dose of Tdap vaccine during each pregnancy. The dose should be obtained regardless of the length of time since the last dose. Immunization is preferred during the 27th-36th week of gestation. An adult who has not previously received Tdap or who does not know her vaccine status should receive 1 dose of Tdap. This initial dose should be followed by tetanus and diphtheria toxoids (Td) booster doses every 10 years. Adults with an unknown or incomplete history of completing a 3-dose immunization series with Td-containing vaccines should begin or complete a primary immunization series including a Tdap dose. Adults should receive a Td booster every 10 years.  Varicella vaccine. An adult without evidence of immunity to varicella should receive 2 doses or a second dose if she has previously received 1 dose. Pregnant females who do not have evidence of immunity should receive the first dose after pregnancy. This first dose should be obtained before leaving the health care facility. The second dose should be obtained 4-8 weeks after the first dose.  Human papillomavirus (HPV)  vaccine. Females aged 13-26 years who have not received the vaccine previously should obtain the 3-dose series. The vaccine is not recommended  for use in pregnant females. However, pregnancy testing is not needed before receiving a dose. If a female is found to be pregnant after receiving a dose, no treatment is needed. In that case, the remaining doses should be delayed until after the pregnancy. Immunization is recommended for any person with an immunocompromised condition through the age of 22 years if she did not get any or all doses earlier. During the 3-dose series, the second dose should be obtained 4-8 weeks after the first dose. The third dose should be obtained 24 weeks after the first dose and 16 weeks after the second dose.  Zoster vaccine. One dose is recommended for adults aged 23 years or older unless certain conditions are present.  Measles, mumps, and rubella (MMR) vaccine. Adults born before [birth year] generally are considered immune to measles and mumps. Adults born in [birth year] or later should have 1 or more doses of MMR vaccine unless there is a contraindication to the vaccine or there is laboratory evidence of immunity to each of the three diseases. A routine second dose of MMR vaccine should be obtained at least 28 days after the first dose for students attending postsecondary schools, health care workers, or international travelers. People who received inactivated measles vaccine or an unknown type of measles vaccine during 1963-1967 should receive 2 doses of MMR vaccine. People who received inactivated mumps vaccine or an unknown type of mumps vaccine before 1979 and are at high risk for mumps infection should consider immunization with 2 doses of MMR vaccine. For females of childbearing age, rubella immunity should be determined. If there is no evidence of immunity, females who are not pregnant should be vaccinated. If there is no evidence of immunity, females who are pregnant should delay  immunization until after pregnancy. Unvaccinated health care workers born before [birth year] who lack laboratory evidence of measles, mumps, or rubella immunity or laboratory confirmation of disease should consider measles and mumps immunization with 2 doses of MMR vaccine or rubella immunization with 1 dose of MMR vaccine.  Pneumococcal 13-valent conjugate (PCV13) vaccine. When indicated, a person who is uncertain of her immunization history and has no record of immunization should receive the PCV13 vaccine. An adult aged 61 years or older who has certain medical conditions and has not been previously immunized should receive 1 dose of PCV13 vaccine. This PCV13 should be followed with a dose of pneumococcal polysaccharide (PPSV23) vaccine. The PPSV23 vaccine dose should be obtained at least 1 or more year(s) after the dose of PCV13 vaccine. An adult aged 61 years or older who has certain medical conditions and previously received 1 or more doses of PPSV23 vaccine should receive 1 dose of PCV13. The PCV13 vaccine dose should be obtained 1 or more years after the last PPSV23 vaccine dose.    Pneumococcal polysaccharide (PPSV23) vaccine. When PCV13 is also indicated, PCV13 should be obtained first. All adults aged 60 years and older should be immunized. An adult younger than age 51 years who has certain medical conditions should be immunized. Any person who resides in a nursing home or long-term care facility should be immunized. An adult smoker should be immunized. People with an immunocompromised condition and certain other conditions should receive both PCV13 and PPSV23 vaccines. People with human immunodeficiency virus (HIV) infection should be immunized as soon as possible after diagnosis. Immunization during chemotherapy or radiation therapy should be avoided. Routine use of PPSV23 vaccine is not recommended for American Indians, [place] Natives, or people younger than 68  years unless there are medical conditions  that require PPSV23 vaccine. When indicated, people who have unknown immunization and have no record of immunization should receive PPSV23 vaccine. One-time revaccination 5 years after the first dose of PPSV23 is recommended for people aged 19-64 years who have chronic kidney failure, nephrotic syndrome, asplenia, or immunocompromised conditions. People who received 1-2 doses of PPSV23 before age 75 years should receive another dose of PPSV23 vaccine at age 60 years or later if at least 5 years have passed since the previous dose. Doses of PPSV23 are not needed for people immunized with PPSV23 at or after age 50 years.  Preventive Services / Frequency   Ages 38 to 15 years  Blood pressure check.  Lipid and cholesterol check.  Lung cancer screening. / Every year if you are aged 68-80 years and have a 30-pack-year history of smoking and currently smoke or have quit within the past 15 years. Yearly screening is stopped once you have quit smoking for at least 15 years or develop a health problem that would prevent you from having lung cancer treatment.  Clinical breast exam.** / Every year after age 23 years.   BRCA-related cancer risk assessment.** / For women who have family members with a BRCA-related cancer (breast, ovarian, tubal, or peritoneal cancers).  Mammogram.** / Every year beginning at age 42 years and continuing for as long as you are in good health. Consult with your health care provider.  Pap test.** / Every 3 years starting at age 65 years through age 34 or 52 years with a history of 3 consecutive normal Pap tests.  HPV screening.** / Every 3 years from ages 36 years through ages 67 to 84 years with a history of 3 consecutive normal Pap tests.  Fecal occult blood test (FOBT) of stool. / Every year beginning at age 51 years and continuing until age 48 years. You may not need to do this test if you get a colonoscopy every 10 years.  Flexible sigmoidoscopy or colonoscopy.** / Every  5 years for a flexible sigmoidoscopy or every 10 years for a colonoscopy beginning at age 84 years and continuing until age 31 years.  Hepatitis C blood test.** / For all people born from [birth year] through 1965 and any individual with known risks for hepatitis C.  Skin self-exam. / Monthly.  Influenza vaccine. / Every year.  Tetanus, diphtheria, and acellular pertussis (Tdap/Td) vaccine.** / Consult your health care provider. Pregnant women should receive 1 dose of Tdap vaccine during each pregnancy. 1 dose of Td every 10 years.  Varicella vaccine.** / Consult your health care provider. Pregnant females who do not have evidence of immunity should receive the first dose after pregnancy.  Zoster vaccine.** / 1 dose for adults aged 81 years or older.  Pneumococcal 13-valent conjugate (PCV13) vaccine.** / Consult your health care provider.  Pneumococcal polysaccharide (PPSV23) vaccine.** / 1 to 2 doses if you smoke cigarettes or if you have certain conditions.  Meningococcal vaccine.** / Consult your health care provider.  Hepatitis A vaccine.** / Consult your health care provider.  Hepatitis B vaccine.** / Consult your health care provider. Screening for abdominal aortic aneurysm (AAA)  by ultrasound is recommended for people over 50 who have history of high blood pressure or who are current or former smokers. ++++++++++++++++++ Recommend Adult Low Dose Aspirin or  coated  Aspirin 81 mg daily  To reduce risk of Colon Cancer 40 %,  Skin Cancer 26 % ,  Melanoma 46%  and  Pancreatic cancer 60% +++++++++++++++++++ Vitamin D goal  is between 70-100.  Please make sure that you are taking your Vitamin D as directed.  It is very important as a natural anti-inflammatory  helping hair, skin, and nails, as well as reducing stroke and heart attack risk.  It helps your bones and helps with mood. It also decreases numerous cancer risks so please take it as directed.  Low Vit D is associated with a  200-300% higher risk for CANCER  and 200-300% higher risk for HEART   ATTACK  &  STROKE.   .....................................Marland Kitchen It is also associated with higher death rate at younger ages,  autoimmune diseases like Rheumatoid arthritis, Lupus, Multiple Sclerosis.    Also many other serious conditions, like depression, Alzheimer's Dementia, infertility, muscle aches, fatigue, fibromyalgia - just to name a few. ++++++++++++++++++ Recommend the book "The END of DIETING" by Dr Excell Seltzer  & the book "The END of DIABETES " by Dr Excell Seltzer At [email or web address] - get book & Audio CD's    Being diabetic has a  300% increased risk for heart attack, stroke, cancer, and alzheimer- type vascular dementia. It is very important that you work harder with diet by avoiding all foods that are white. Avoid white rice (brown & wild rice is OK), white potatoes (sweetpotatoes in moderation is OK), White bread or wheat bread or anything made out of white flour like bagels, donuts, rolls, buns, biscuits, cakes, pastries, cookies, pizza crust, and pasta (made from white flour & egg whites) - vegetarian pasta or spinach or wheat pasta is OK. Multigrain breads like Arnold's or [employer], or multigrain sandwich thins or flatbreads.  Diet, exercise and weight loss can reverse and cure diabetes in the early stages.  Diet, exercise and weight loss is very important in the control and prevention of complications of diabetes which affects every system in your body, ie. Brain - dementia/stroke, eyes - glaucoma/blindness, heart - heart attack/heart failure, kidneys - dialysis, stomach - gastric paralysis, intestines - malabsorption, nerves - severe painful neuritis, circulation - gangrene & loss of a leg(s), and finally cancer and Alzheimers.    I recommend avoid fried & greasy foods,  sweets/candy, white rice (brown or wild rice or Quinoa is OK), white potatoes (sweet potatoes are OK) - anything made from white flour - bagels,  doughnuts, rolls, buns, biscuits,white and wheat breads, pizza crust and traditional pasta made of white flour & egg white(vegetarian pasta or spinach or wheat pasta is OK).  Multi-grain bread is OK - like multi-grain flat bread or sandwich thins. Avoid alcohol in excess. Exercise is also important.    Eat all the vegetables you want - avoid meat, especially red meat and dairy - especially cheese.  Cheese is the most concentrated form of trans-fats which is the worst thing to clog up our arteries. Veggie cheese is OK which can be found in the fresh produce section at [employer] or [employer] or [employer]  ++++++++++++++++++++++ DASH Eating Plan  DASH stands for "Dietary Approaches to Stop Hypertension."   The DASH eating plan is a healthy eating plan that has been shown to reduce high blood pressure (hypertension). Additional health benefits may include reducing the risk of type 2 diabetes mellitus, heart disease, and stroke. The DASH eating plan may also help with weight loss. WHAT DO I NEED TO KNOW ABOUT THE DASH EATING PLAN? For the DASH eating plan, you will follow these general guidelines:  Choose foods with a  percent daily value for sodium of less than 5% (as listed on the food label).  Use salt-free seasonings or herbs instead of table salt or sea salt.  Check with your health care provider or pharmacist before using salt substitutes.  Eat lower-sodium products, often labeled as "lower sodium" or "no salt added."  Eat fresh foods.  Eat more vegetables, fruits, and low-fat dairy products.  Choose whole grains. Look for the word "whole" as the first word in the ingredient list.  Choose fish   Limit sweets, desserts, sugars, and sugary drinks.  Choose heart-healthy fats.  Eat veggie cheese   Eat more home-cooked food and less restaurant, buffet, and fast food.  Limit fried foods.  Cook foods using methods other than frying.  Limit canned vegetables. If you do use  them, rinse them well to decrease the sodium.  When eating at a restaurant, ask that your food be prepared with less salt, or no salt if possible.                      WHAT FOODS CAN I EAT? Read Dr Fara Olden Fuhrman's books on The End of Dieting & The End of Diabetes  Grains Whole grain or whole wheat bread. Brown rice. Whole grain or whole wheat pasta. Quinoa, bulgur, and whole grain cereals. Low-sodium cereals. Corn or whole wheat flour tortillas. Whole grain cornbread. Whole grain crackers. Low-sodium crackers.  Vegetables Fresh or frozen vegetables (raw, steamed, roasted, or grilled). Low-sodium or reduced-sodium tomato and vegetable juices. Low-sodium or reduced-sodium tomato sauce and paste. Low-sodium or reduced-sodium canned vegetables.   Fruits All fresh, canned (in natural juice), or frozen fruits.  Protein Products  All fish and seafood.  Dried beans, peas, or lentils. Unsalted nuts and seeds. Unsalted canned beans.  Dairy Low-fat dairy products, such as skim or 1% milk, 2% or reduced-fat cheeses, low-fat ricotta or cottage cheese, or plain low-fat yogurt. Low-sodium or reduced-sodium cheeses.  Fats and Oils Tub margarines without trans fats. Light or reduced-fat mayonnaise and salad dressings (reduced sodium). Avocado. Safflower, olive, or canola oils. Natural peanut or almond butter.  Other Unsalted popcorn and pretzels. The items listed above may not be a complete list of recommended foods or beverages. Contact your dietitian for more options.  ++++++++++++++++++  WHAT FOODS ARE NOT RECOMMENDED? Grains/ White flour or wheat flour White bread. White pasta. White rice. Refined cornbread. Bagels and croissants. Crackers that contain trans fat.  Vegetables  Creamed or fried vegetables. Vegetables in a . Regular canned vegetables. Regular canned tomato sauce and paste. Regular tomato and vegetable juices.  Fruits Dried fruits. Canned fruit in light or heavy syrup. Fruit  juice.  Meat and Other Protein Products Meat in general - RED meat & White meat.  Fatty cuts of meat. Ribs, chicken wings, all processed meats as bacon, sausage, bologna, salami, fatback, hot dogs, bratwurst and packaged luncheon meats.  Dairy Whole or 2% milk, cream, half-and-half, and cream cheese. Whole-fat or sweetened yogurt. Full-fat cheeses or blue cheese. Non-dairy creamers and whipped toppings. Processed cheese, cheese spreads, or cheese curds.  Condiments Onion and garlic salt, seasoned salt, table salt, and sea salt. Canned and packaged gravies. Worcestershire sauce. Tartar sauce. Barbecue sauce. Teriyaki sauce. Soy sauce, including reduced sodium. Steak sauce. Fish sauce. Oyster sauce. Cocktail sauce. Horseradish. Ketchup and mustard. Meat flavorings and tenderizers. Bouillon cubes. Hot sauce. Tabasco sauce. Marinades. Taco seasonings. Relishes.  Fats and Oils Butter, stick margarine, lard, shortening and bacon fat.  Coconut, palm kernel, or palm oils. Regular salad dressings.  Pickles and olives. Salted popcorn and pretzels.  The items listed above may not be a complete list of foods and beverages to avoid.

## 2019-09-30 NOTE — Progress Notes (Signed)
Complete Physical  Assessment and Plan:     Rebecca Ochoa was seen today for annual exam.  Diagnoses and all orders for this visit:  Encounter for annual physical exam Yearly -     CBC with Diff -     COMPLETE METABOLIC PANEL WITH GFR -     TSH  Hyperlipidemia, unspecified hyperlipidemia type Discussed dietary and exercise modifications -     Lipid Profile  Anemia, unspecified type Not taking supplements at this time Continue to monitor  Vitamin D deficiency -     Vitamin D (25 hydroxy)  Tobacco dependence Discussed risks associated with tobacco use and advised to reduce or quit - she is no longer smoking cigarettes but continues to vape daily - reports no longer has smoker's cough or AM secretions/recurrent infections Patient is not ready to do so, but advised to consider strongly  Will follow up at the next visit  Irritable bowel syndrome with constipation Doing well at this time Controlled by diet, monitoring  Anxiety Doing well on current regiment Continue  Discussed stress management techniques   Discussed good sleep hygiene Discussed increasing physical activity and exercise Increase water intake  Hx of migraines Doing well at this time, no medications, OTC if needed No recent flares  Other abnormal glucose Discussed dietary and exercise modifications -     Hemoglobin A1c ([facility])  BMI 24.0-24.9, adult Discussed dietary and exercise modifications  Medication management Continued -     Urinalysis w microscopic + reflex cultur  Screening for blood or protein in urine -     Urinalysis w microscopic + reflex cultur -     REFLEXIVE URINE CULTURE  Pap Due, schedule for 6 months   Continue diet and meds as discussed. Further disposition pending results of labs. Discussed med's effects and SE's.   Over 30 minutes of exam, interview, counseling, chart review, and critical decision making was performed.   Future Appointments  Date Time Provider [place]  03/31/2020  3:45 PM Garnet Sierras, NP [facility] None  10/06/2020  3:00 PM Farhan Jean, Danton Sewer, NP [facility] None    ----------------------------------------------------------------------------------------------------------------------  HPI 58 y.o. female  presents for complete physical and follow up on anxiety, smoking, HLD and vitamin D deficiency. Reports she now has health insurance and knows she has some health screenings to follow up on.  She has a diagnosis of anxiety and taking xanax 1 mg BID PRN, reports symptoms are well controlled on current regimen. She reports anxiety is much improved; typically takes 1 mg at night for sleep, and very rarely takes if she has a severely stressful day at work, or if traveling by air.   She does not smoke but continues to vape; discussed risks associated with smoking, patient is not ready to quit.   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 She is going to rehab although she is not improving and likely will need to transfer to skilled nursing perminatley and unable to return home.  She also has history of detracted retnia, and she is legally blind.   Also reports that Rebecca Ochoa 58 year old sone fell on his skateboard and fractured his wrist and hurt his knee, both on right side.  Difficult to use crutches, he is having MRI at end of the week.     BMI is Body mass index is 24.58 kg/m., she has not been working on diet and exercise, but plans to start going to the  gym with Rebecca Ochoa teenage son. Wt Readings from Last 3 Encounters:  10/01/19 150 lb (68 kg)  06/11/19 157 lb (71.2 kg)  05/14/18 154 lb (69.9 kg)   Today their BP is BP: 118/74 She denies chest pain, shortness of breath, dizziness.   She is not on cholesterol medication and denies myalgias. Rebecca Ochoa cholesterol is not at goal. The cholesterol last visit was:   Lab Results  Component Value Date   CHOL 194 07/28/2017   HDL 67 07/28/2017   LDLCALC 107 (H)  07/28/2017   TRIG 103 07/28/2017   CHOLHDL 2.9 07/28/2017    She has been working on diet and exercise for glucose management, and denies foot ulcerations, hyperglycemia, increased appetite, nausea, paresthesia of the feet, polydipsia, polyuria, visual disturbances, vomiting and weight loss. Last A1C in the office was:  Lab Results  Component Value Date   HGBA1C 5.1 07/28/2017   Patient has hx of vitamin D deficiency and has been recommended supplementation - she has started on 5000 IU daily since last visit.  Lab Results  Component Value Date   VD25OH 35 07/28/2017        Current Medications:  Current Outpatient Medications on File Prior to Visit  Medication Sig  . ALPRAZolam (XANAX) 1 MG tablet Take 1/2 to 1 tablet 1 to 2 x / day ONLY if needed for Anxiety Attack & please try to limit to 5 days /week to avoid addiction  . aspirin 81 MG chewable tablet Chew by mouth daily.   No current facility-administered medications on file prior to visit.      Allergies:  Allergies  Allergen Reactions  . Biaxin [Clarithromycin] Nausea Only     Medical History:  Past Medical History:  Diagnosis Date  . Anemia   . Asthma, mild intermittent, well-controlled   . GERD (gastroesophageal reflux disease)   . Hx of migraines   . Hyperlipidemia   . Vitamin D deficiency    Family history- Reviewed and unchanged Social history- Reviewed and unchanged    Names of Other Physician/Practitioners you currently use: 1. [facility] here for primary care 2.Eye Exam: DUE 3. Dental exam: DUE  Patient Care Team: [name], MD as PCP - General (Internal Medicine) Hanley Seamen Dustin Folks, MD as Referring Physician (Optometry) Ilda Mori, MD as Consulting Physician (Obstetrics and Gynecology) Arminda Resides, MD as Consulting Physician (Dermatology)    Screening Tests: Immunization History  Administered Date(s) Administered  . PPD Test 12/08/2014   . Pneumococcal Polysaccharide-23 10/31/1996  . Td 10/31/2009    Preventative care: Last colonoscopy: DUE Last mammogram: 2008, DUE Last pap smear/pelvic exam: DUE, Declined today    Vaccinations: TD or Tdap: 10/2009  Influenza: Declined Pneumococcal: 10/1996 Prevnar13: N/A Shingles/Zostavax: Discussed   Review of Systems:  Review of Systems  Constitutional: Negative for malaise/fatigue and weight loss.  HENT: Negative for hearing loss and tinnitus.   Eyes: Negative for blurred vision and double vision.  Respiratory: Negative for cough, shortness of breath and wheezing.   Cardiovascular: Negative for chest pain, palpitations, orthopnea, claudication and leg swelling.  Gastrointestinal: Negative for abdominal pain, blood in stool, constipation, diarrhea, heartburn, melena, nausea and vomiting.  Genitourinary: Negative.   Musculoskeletal: Negative for joint pain and myalgias.  Skin: Negative for rash.  Neurological: Negative for dizziness, tingling, sensory change, weakness and headaches.  Endo/Heme/Allergies: Negative for polydipsia.  Psychiatric/Behavioral: Negative.   All other systems reviewed and are negative.     Physical Exam: BP 118/74  Pulse 66   Temp 97.6 F (36.4 C)   Ht 5' 5.5" (1.664 m)   Wt 150 lb (68 kg)   SpO2 96%   BMI 24.58 kg/m  Wt Readings from Last 3 Encounters:  10/01/19 150 lb (68 kg)  06/11/19 157 lb (71.2 kg)  05/14/18 154 lb (69.9 kg)   General Appearance: Well nourished, in no apparent distress. Eyes: PERRLA, EOMs, conjunctiva no swelling or erythema Sinuses: No Frontal/maxillary tenderness ENT/Mouth: Ext aud canals clear, TMs without erythema, bulging. No erythema, swelling, or exudate on post pharynx.  Tonsils not swollen or erythematous. Hearing normal.  Neck: Supple, thyroid normal.  Respiratory: Respiratory effort normal, BS equal bilaterally without rales, rhonchi, wheezing or stridor.  Cardio: RRR with no MRGs. Brisk  peripheral pulses without edema.  Abdomen: Soft, + BS.  Non tender, no guarding, rebound, hernias, masses. Lymphatics: Non tender without lymphadenopathy.  Musculoskeletal: Full ROM, 5/5 strength, Normal gait Skin: Warm, dry without rashes, lesions, ecchymosis. She has some thickening of base of right 5th toe with mild tenderness to palpation on sole.  Neuro: Cranial nerves intact. No cerebellar symptoms.  Psych: Awake and oriented X 3, normal affect, Insight and Judgment appropriate.   EKG: Declines  Elder NegusKyra Balian Schaller, NP 3:45 PM [facility]

## 2019-10-01 ENCOUNTER — Other Ambulatory Visit: Payer: Self-pay

## 2019-10-01 ENCOUNTER — Encounter: Payer: Self-pay | Admitting: Adult Health Nurse Practitioner

## 2019-10-01 ENCOUNTER — Ambulatory Visit (INDEPENDENT_AMBULATORY_CARE_PROVIDER_SITE_OTHER): Payer: BC Managed Care – PPO | Admitting: Adult Health Nurse Practitioner

## 2019-10-01 VITALS — BP 118/74 | HR 66 | Temp 97.6°F | Ht 65.5 in | Wt 150.0 lb

## 2019-10-01 DIAGNOSIS — Z6824 Body mass index (BMI) 24.0-24.9, adult: Secondary | ICD-10-CM

## 2019-10-01 DIAGNOSIS — R7309 Other abnormal glucose: Secondary | ICD-10-CM

## 2019-10-01 DIAGNOSIS — D649 Anemia, unspecified: Secondary | ICD-10-CM

## 2019-10-01 DIAGNOSIS — Z0000 Encounter for general adult medical examination without abnormal findings: Secondary | ICD-10-CM | POA: Diagnosis not present

## 2019-10-01 DIAGNOSIS — F419 Anxiety disorder, unspecified: Secondary | ICD-10-CM

## 2019-10-01 DIAGNOSIS — Z8669 Personal history of other diseases of the nervous system and sense organs: Secondary | ICD-10-CM

## 2019-10-01 DIAGNOSIS — E785 Hyperlipidemia, unspecified: Secondary | ICD-10-CM

## 2019-10-01 DIAGNOSIS — Z79899 Other long term (current) drug therapy: Secondary | ICD-10-CM

## 2019-10-01 DIAGNOSIS — E559 Vitamin D deficiency, unspecified: Secondary | ICD-10-CM

## 2019-10-01 DIAGNOSIS — Z1389 Encounter for screening for other disorder: Secondary | ICD-10-CM

## 2019-10-01 DIAGNOSIS — F17200 Nicotine dependence, unspecified, uncomplicated: Secondary | ICD-10-CM

## 2019-10-01 DIAGNOSIS — K581 Irritable bowel syndrome with constipation: Secondary | ICD-10-CM

## 2019-10-02 LAB — URINALYSIS W MICROSCOPIC + REFLEX CULTURE
Bacteria, UA: NONE SEEN /HPF
Bilirubin Urine: NEGATIVE
Glucose, UA: NEGATIVE
Hgb urine dipstick: NEGATIVE
Hyaline Cast: NONE SEEN /LPF
Ketones, ur: NEGATIVE
Leukocyte Esterase: NEGATIVE
Nitrites, Initial: NEGATIVE
Protein, ur: NEGATIVE
RBC / HPF: NONE SEEN /HPF (ref 0–2)
Specific Gravity, Urine: 1.005 (ref 1.001–1.03)
Squamous Epithelial / HPF: NONE SEEN /HPF (ref <=5)
WBC, UA: NONE SEEN /HPF (ref 0–5)
pH: 7 (ref 5.0–8.0)

## 2019-10-02 LAB — COMPLETE METABOLIC PANEL WITH GFR
AG Ratio: 1.3 (calc) (ref 1.0–2.5)
ALT: 13 U/L (ref 6–29)
AST: 15 U/L (ref 10–35)
Albumin: 4.3 g/dL (ref 3.6–5.1)
Alkaline phosphatase (APISO): 85 U/L (ref 37–153)
BUN: 7 mg/dL (ref 7–25)
CO2: 28 mmol/L (ref 20–32)
Calcium: 9.9 mg/dL (ref 8.6–10.4)
Chloride: 102 mmol/L (ref 98–110)
Creat: 0.81 mg/dL (ref 0.50–1.05)
GFR, Est African American: 93 mL/min/{1.73_m2} (ref >=60)
GFR, Est Non African American: 80 mL/min/{1.73_m2} (ref >=60)
Globulin: 3.2 g/dL (calc) (ref 1.9–3.7)
Glucose, Bld: 84 mg/dL (ref 65–99)
Potassium: 4.1 mmol/L (ref 3.5–5.3)
Sodium: 138 mmol/L (ref 135–146)
Total Bilirubin: 0.6 mg/dL (ref 0.2–1.2)
Total Protein: 7.5 g/dL (ref 6.1–8.1)

## 2019-10-02 LAB — CBC WITH DIFFERENTIAL/PLATELET
Absolute Monocytes: 391 cells/uL (ref 200–950)
Basophils Absolute: 19 cells/uL (ref 0–200)
Basophils Relative: 0.3 %
Eosinophils Absolute: 82 cells/uL (ref 15–500)
Eosinophils Relative: 1.3 %
HCT: 40.7 % (ref 35.0–45.0)
Hemoglobin: 13.8 g/dL (ref 11.7–15.5)
Lymphs Abs: 1418 cells/uL (ref 850–3900)
MCH: 31 pg (ref 27.0–33.0)
MCHC: 33.9 g/dL (ref 32.0–36.0)
MCV: 91.5 fL (ref 80.0–100.0)
MPV: 11.2 fL (ref 7.5–12.5)
Monocytes Relative: 6.2 %
Neutro Abs: 4391 cells/uL (ref 1500–7800)
Neutrophils Relative %: 69.7 %
Platelets: 217 10*3/uL (ref 140–400)
RBC: 4.45 10*6/uL (ref 3.80–5.10)
RDW: 11.8 % (ref 11.0–15.0)
Total Lymphocyte: 22.5 %
WBC: 6.3 10*3/uL (ref 3.8–10.8)

## 2019-10-02 LAB — LIPID PANEL
Cholesterol: 227 mg/dL — ABNORMAL HIGH (ref <200)
HDL: 62 mg/dL (ref >=50)
LDL Cholesterol (Calc): 142 mg/dL (calc) — ABNORMAL HIGH
Non-HDL Cholesterol (Calc): 165 mg/dL (calc) — ABNORMAL HIGH (ref <130)
Total CHOL/HDL Ratio: 3.7 (calc) (ref <5.0)
Triglycerides: 111 mg/dL (ref <150)

## 2019-10-02 LAB — VITAMIN D 25 HYDROXY (VIT D DEFICIENCY, FRACTURES): Vit D, 25-Hydroxy: 16 ng/mL — ABNORMAL LOW (ref 30–100)

## 2019-10-02 LAB — TSH: TSH: 0.95 mIU/L (ref 0.40–4.50)

## 2019-10-02 LAB — HEMOGLOBIN A1C
Hgb A1c MFr Bld: 5.2 % of total Hgb (ref <5.7)
Mean Plasma Glucose: 103 (calc)
eAG (mmol/L): 5.7 (calc)

## 2019-10-02 LAB — NO CULTURE INDICATED

## 2019-10-03 ENCOUNTER — Other Ambulatory Visit: Payer: Self-pay | Admitting: Adult Health Nurse Practitioner

## 2019-10-03 DIAGNOSIS — E559 Vitamin D deficiency, unspecified: Secondary | ICD-10-CM

## 2019-10-03 MED ORDER — CHOLECALCIFEROL 1.25 MG (50000 UT) PO CAPS: ORAL_CAPSULE | ORAL | 0 refills | Status: DC

## 2019-10-28 ENCOUNTER — Other Ambulatory Visit: Payer: Self-pay

## 2019-10-28 DIAGNOSIS — F419 Anxiety disorder, unspecified: Secondary | ICD-10-CM

## 2019-10-28 MED ORDER — ALPRAZOLAM 1 MG PO TABS: ORAL_TABLET | ORAL | 0 refills | Status: DC

## 2019-12-17 ENCOUNTER — Other Ambulatory Visit: Payer: Self-pay | Admitting: Adult Health Nurse Practitioner

## 2019-12-17 ENCOUNTER — Other Ambulatory Visit: Payer: Self-pay

## 2019-12-17 DIAGNOSIS — F419 Anxiety disorder, unspecified: Secondary | ICD-10-CM

## 2019-12-17 MED ORDER — ALPRAZOLAM 1 MG PO TABS: ORAL_TABLET | ORAL | 0 refills | Status: DC

## 2020-02-06 ENCOUNTER — Other Ambulatory Visit: Payer: Self-pay

## 2020-02-06 DIAGNOSIS — F419 Anxiety disorder, unspecified: Secondary | ICD-10-CM

## 2020-02-07 MED ORDER — ALPRAZOLAM 1 MG PO TABS: ORAL_TABLET | ORAL | 0 refills | Status: DC

## 2020-03-25 ENCOUNTER — Other Ambulatory Visit: Payer: Self-pay

## 2020-03-25 DIAGNOSIS — F419 Anxiety disorder, unspecified: Secondary | ICD-10-CM

## 2020-03-26 MED ORDER — ALPRAZOLAM 1 MG PO TABS: ORAL_TABLET | ORAL | 0 refills | Status: DC

## 2020-03-31 ENCOUNTER — Other Ambulatory Visit: Payer: Self-pay

## 2020-03-31 ENCOUNTER — Encounter: Payer: Self-pay | Admitting: Adult Health Nurse Practitioner

## 2020-03-31 ENCOUNTER — Ambulatory Visit (INDEPENDENT_AMBULATORY_CARE_PROVIDER_SITE_OTHER): Payer: BC Managed Care – PPO | Admitting: Adult Health Nurse Practitioner

## 2020-03-31 VITALS — BP 128/74 | HR 85 | Temp 97.3°F | Ht 65.5 in | Wt 142.8 lb

## 2020-03-31 DIAGNOSIS — D649 Anemia, unspecified: Secondary | ICD-10-CM

## 2020-03-31 DIAGNOSIS — K581 Irritable bowel syndrome with constipation: Secondary | ICD-10-CM

## 2020-03-31 DIAGNOSIS — Z124 Encounter for screening for malignant neoplasm of cervix: Secondary | ICD-10-CM | POA: Diagnosis not present

## 2020-03-31 DIAGNOSIS — Z23 Encounter for immunization: Secondary | ICD-10-CM

## 2020-03-31 DIAGNOSIS — E785 Hyperlipidemia, unspecified: Secondary | ICD-10-CM | POA: Diagnosis not present

## 2020-03-31 DIAGNOSIS — Z79899 Other long term (current) drug therapy: Secondary | ICD-10-CM

## 2020-03-31 DIAGNOSIS — I10 Essential (primary) hypertension: Secondary | ICD-10-CM

## 2020-03-31 DIAGNOSIS — F17200 Nicotine dependence, unspecified, uncomplicated: Secondary | ICD-10-CM | POA: Diagnosis not present

## 2020-03-31 DIAGNOSIS — Z8669 Personal history of other diseases of the nervous system and sense organs: Secondary | ICD-10-CM

## 2020-03-31 DIAGNOSIS — Z6824 Body mass index (BMI) 24.0-24.9, adult: Secondary | ICD-10-CM

## 2020-03-31 DIAGNOSIS — R7309 Other abnormal glucose: Secondary | ICD-10-CM

## 2020-03-31 DIAGNOSIS — F419 Anxiety disorder, unspecified: Secondary | ICD-10-CM

## 2020-03-31 DIAGNOSIS — Z1211 Encounter for screening for malignant neoplasm of colon: Secondary | ICD-10-CM

## 2020-03-31 DIAGNOSIS — E559 Vitamin D deficiency, unspecified: Secondary | ICD-10-CM | POA: Diagnosis not present

## 2020-03-31 LAB — COMPLETE METABOLIC PANEL WITH GFR
AG Ratio: 1.5 (calc) (ref 1.0–2.5)
ALT: 12 U/L (ref 6–29)
AST: 13 U/L (ref 10–35)
Albumin: 4.4 g/dL (ref 3.6–5.1)
Alkaline phosphatase (APISO): 91 U/L (ref 37–153)
BUN: 11 mg/dL (ref 7–25)
CO2: 31 mmol/L (ref 20–32)
Calcium: 10.1 mg/dL (ref 8.6–10.4)
Chloride: 104 mmol/L (ref 98–110)
Creat: 0.7 mg/dL (ref 0.50–1.05)
GFR, Est African American: 110 mL/min/{1.73_m2} (ref >=60)
GFR, Est Non African American: 95 mL/min/{1.73_m2} (ref >=60)
Globulin: 3 g/dL (calc) (ref 1.9–3.7)
Glucose, Bld: 84 mg/dL (ref 65–99)
Potassium: 4.5 mmol/L (ref 3.5–5.3)
Sodium: 143 mmol/L (ref 135–146)
Total Bilirubin: 0.4 mg/dL (ref 0.2–1.2)
Total Protein: 7.4 g/dL (ref 6.1–8.1)

## 2020-03-31 LAB — CBC WITH DIFFERENTIAL/PLATELET
Absolute Monocytes: 503 cells/uL (ref 200–950)
Basophils Absolute: 27 cells/uL (ref 0–200)
Basophils Relative: 0.4 %
Eosinophils Absolute: 129 cells/uL (ref 15–500)
Eosinophils Relative: 1.9 %
HCT: 39.6 % (ref 35.0–45.0)
Hemoglobin: 13.3 g/dL (ref 11.7–15.5)
Lymphs Abs: 1503 cells/uL (ref 850–3900)
MCH: 31.3 pg (ref 27.0–33.0)
MCHC: 33.6 g/dL (ref 32.0–36.0)
MCV: 93.2 fL (ref 80.0–100.0)
MPV: 11.3 fL (ref 7.5–12.5)
Monocytes Relative: 7.4 %
Neutro Abs: 4638 cells/uL (ref 1500–7800)
Neutrophils Relative %: 68.2 %
Platelets: 249 10*3/uL (ref 140–400)
RBC: 4.25 10*6/uL (ref 3.80–5.10)
RDW: 12 % (ref 11.0–15.0)
Total Lymphocyte: 22.1 %
WBC: 6.8 10*3/uL (ref 3.8–10.8)

## 2020-03-31 LAB — IRON, TOTAL/TOTAL IRON BINDING CAP
%SAT: 21 % (calc) (ref 16–45)
Iron: 72 ug/dL (ref 45–160)
TIBC: 349 mcg/dL (calc) (ref 250–450)

## 2020-03-31 LAB — VITAMIN D 25 HYDROXY (VIT D DEFICIENCY, FRACTURES): Vit D, 25-Hydroxy: 79 ng/mL (ref 30–100)

## 2020-03-31 NOTE — Progress Notes (Signed)
6 MONTH FOLLOW UP  Assessment and Plan:   [name] was seen today for 6 month follow up and Pap. Diagnoses and all orders for this visit:  Screening for cervical cancer -Pap  Hyperlipidemia, unspecified hyperlipidemia type Discussed dietary and exercise modifications Has not made any dietary changes Discussed metamucil Defer labs till next OV 6 months.  Anemia, unspecified type Not taking supplements at this time -iron panel  Vitamin D deficiency Completed Rx 50,000IU -     Vitamin D (25 hydroxy) Dose pending lab results  Tobacco dependence Discussed risks associated with tobacco use and advised to reduce or quit - she is no longer smoking cigarettes but continues to vape daily - reports no longer has smoker's cough or AM secretions/recurrent infections Patient is not ready to do so, but advised to consider strongly  Will follow up at the next OV  Irritable bowel syndrome with constipation Doing well at this time Controlled by diet, monitoring Metamucil will also help with this.  Anxiety Doing well on current regiment Continue : alprazolam 1/2 to one tablet daily Discussed stress management techniques   Discussed good sleep hygiene Discussed increasing physical activity and exercise Increase water intake  Hx of migraines Doing well at this time, no medications, OTC if needed No recent flares  Other abnormal glucose Discussed dietary and exercise modifications Will check A1c at next OV  BMI 24.0-24.9, adult Discussed dietary and exercise modifications  Need for Tdap vaccination  Medication management - Continued  Screening for colorectal cancer -Rx Cologuard   Continue diet and meds as discussed. Further disposition pending results of labs. Discussed med's effects and SE's.   Over 30 minutes of face to face exam, interview, counseling, chart review, and critical decision making was performed.   Future Appointments  Date Time Provider [place]   10/06/2020  3:00 PM Garnet Sierras, NP [facility] None    ----------------------------------------------------------------------------------------------------------------------  HPI 59 y.o. female  presents for complete physical and follow up on anxiety, smoking, HLD and vitamin D deficiency.    She has a diagnosis of anxiety and taking xanax 1 mg BID PRN, reports symptoms are well controlled on current regimen. She reports anxiety is much improved; typically takes 1 mg at night for sleep, and very rarely takes if she has a severely stressful day at work, or if traveling by air.   She does not smoke but continues to vape; discussed risks associated with smoking, patient is not ready to quit.   Reports Mom had a massive stroke and she is not able to use her left side. She is going to rehab although she is not improving and likely will need to transfer to skilled nursing perminatley and unable to return home.  She also has history of detached retnia, and she is legally blind.      BMI is Body mass index is 23.4 kg/m., she has not been working on diet and exercise, but plans to start going to the gym with her teenage son. Wt Readings from Last 3 Encounters:  03/31/20 142 lb 12.8 oz (64.8 kg)  10/01/19 150 lb (68 kg)  06/11/19 157 lb (71.2 kg)   Today their BP is BP: 128/74 She denies chest pain, shortness of breath, dizziness.   She is not on cholesterol medication and denies myalgias. Her cholesterol is not at goal. The cholesterol last visit was:   Lab Results  Component Value Date   CHOL 227 10/01/2019   HDL 62 10/01/2019   LDLCALC 142 (  H) 10/01/2019   TRIG 122 10/01/2019   CHOLHDL 3.7 10/01/2019    She has been working on diet and exercise for glucose management, and denies foot ulcerations, hyperglycemia, increased appetite, nausea, paresthesia of the feet, polydipsia, polyuria, visual disturbances, vomiting and weight loss. Last A1C in the office was:  Lab Results  Component  Value Date   HGBA1C 5.2 10/01/2019   Patient has hx of vitamin D deficiency and completed Rx of 50,000iu three days a week for 12 weeks.  Not taking any supplement at this time. Lab Results  Component Value Date   VD25OH 16 10/01/2019        Current Medications:  Current Outpatient Medications on File Prior to Visit  Medication Sig  . ALPRAZolam (XANAX) 1 MG tablet Take 1/2 - 1 tablet  1 - 2 x /day ONLY if needed for Anxiety Attack &  limit to 5 days /week to avoid Addiction & Dementia  . aspirin 81 MG chewable tablet Chew by mouth daily.  . Cholecalciferol 1.25 MG (50000 UT) capsule Take one tablet by mouth three days a week for twelve weeks. (Patient not taking: Reported on 03/31/2020)   No current facility-administered medications on file prior to visit.     Allergies:  Allergies  Allergen Reactions  . Biaxin [Clarithromycin] Nausea Only     Medical History:  Past Medical History:  Diagnosis Date  . Anemia   . Asthma, mild intermittent, well-controlled   . GERD (gastroesophageal reflux disease)   . Hx of migraines   . Hyperlipidemia   . Vitamin D deficiency    Family history- Reviewed and unchanged Social history- Reviewed and unchanged    Names of Other Physician/Practitioners you currently use: 1. [facility] here for primary care 2.Eye Exam: DUE 3. Dental exam: DUE  Patient Care Team: [name], MD as PCP - General (Internal Medicine) Hanley Seamen Dustin Folks, MD as Referring Physician (Optometry) Ilda Mori, MD as Consulting Physician (Obstetrics and Gynecology) Arminda Resides, MD as Consulting Physician (Dermatology)    Screening Tests: Immunization History  Administered Date(s) Administered  . PPD Test 12/08/2014  . Pneumococcal Polysaccharide-23 10/31/1996  . Td 10/31/2009    Preventative care: Last colonoscopy: DUE Last mammogram: 2008, DUE Last pap smear/pelvic exam: DUE, Declined today LMP: 10  years ago.   Vaccinations: TD or Tdap: 10/2009, Due   Influenza: Declined Pneumococcal: 10/1996 Prevnar13: N/A Shingles/Zostavax: Discussed   Review of Systems:  Review of Systems  Constitutional: Negative for malaise/fatigue and weight loss.  HENT: Negative for hearing loss and tinnitus.   Eyes: Negative for blurred vision and double vision.  Respiratory: Negative for cough, shortness of breath and wheezing.   Cardiovascular: Negative for chest pain, palpitations, orthopnea, claudication and leg swelling.  Gastrointestinal: Negative for abdominal pain, blood in stool, constipation, diarrhea, heartburn, melena, nausea and vomiting.  Genitourinary: Negative.   Musculoskeletal: Negative for joint pain and myalgias.  Skin: Negative for rash.  Neurological: Negative for dizziness, tingling, sensory change, weakness and headaches.  Endo/Heme/Allergies: Negative for polydipsia.  Psychiatric/Behavioral: Negative.   All other systems reviewed and are negative.     Physical Exam: BP 128/74   Pulse 85   Temp (!) 97.3 F (36.3 C)   Ht 5' 5.5" (1.664 m)   Wt 142 lb 12.8 oz (64.8 kg)   SpO2 97%   BMI 23.40 kg/m  Wt Readings from Last 3 Encounters:  03/31/20 142 lb 12.8 oz (64.8 kg)  10/01/19 150 lb (  68 kg)  06/11/19 157 lb (71.2 kg)   General Appearance: Well nourished, in no apparent distress. Eyes: PERRLA, EOMs, conjunctiva no swelling or erythema Sinuses: No Frontal/maxillary tenderness ENT/Mouth: Ext aud canals clear, TMs without erythema, bulging. No erythema, swelling, or exudate on post pharynx.  Tonsils not swollen or erythematous. Hearing normal.  Neck: Supple, thyroid normal.  Respiratory: Respiratory effort normal, BS equal bilaterally without rales, rhonchi, wheezing or stridor.  Cardio: RRR with no MRGs. Brisk peripheral pulses without edema.  Abdomen: Soft, + BS.  Non tender, no guarding, rebound, hernias, masses. Lymphatics: Non tender without lymphadenopathy.   Musculoskeletal: Full ROM, 5/5 strength, Normal gait Skin: Warm, dry without rashes, lesions, ecchymosis. She has some thickening of base of right 5th toe with mild tenderness to palpation on sole.  Neuro: Cranial nerves intact. No cerebellar symptoms.  Psych: Awake and oriented X 3, normal affect, Insight and Judgment appropriate.   Pelvic: External genitalia:  no lesions              Urethra:  normal appearing urethra with no masses, tenderness or lesions              Bartholin's and Skene's: normal                 Vagina: normal appearing vagina with normal color and discharge, no lesions, mild cystocele grade 1-2 noted              Cervix: multiparous appearance, no cervical motion tenderness and no lesions              Pap taken: Yes.   Bimanual Exam:  Uterus:  normal size, contour, position, consistency, mobility, non-tender              Adnexa: normal adnexa and no mass, fullness, tenderness                 Elder Negus, NP 3:45 PM [facility]

## 2020-04-02 LAB — PAP, TP IMAGING W/ HPV RNA, RFLX HPV TYPE 16,18/45: HPV DNA High Risk: NOT DETECTED

## 2020-04-02 LAB — PAP, TP IMAGING, WNL RFLX HPV

## 2020-04-18 ENCOUNTER — Ambulatory Visit
Admission: EM | Admit: 2020-04-18 | Discharge: 2020-04-18 | Disposition: A | Discharge status: Home / Self Care | Payer: BC Managed Care – PPO | Attending: Emergency Medicine | Admitting: Emergency Medicine

## 2020-04-18 ENCOUNTER — Encounter: Payer: Self-pay | Admitting: Emergency Medicine

## 2020-04-18 ENCOUNTER — Other Ambulatory Visit: Payer: Self-pay

## 2020-04-18 DIAGNOSIS — R05 Cough: Secondary | ICD-10-CM | POA: Insufficient documentation

## 2020-04-18 DIAGNOSIS — J029 Acute pharyngitis, unspecified: Secondary | ICD-10-CM | POA: Diagnosis not present

## 2020-04-18 DIAGNOSIS — R059 Cough, unspecified: Secondary | ICD-10-CM

## 2020-04-18 LAB — POCT RAPID STREP A (OFFICE): Rapid Strep A Screen: NEGATIVE

## 2020-04-18 MED ORDER — BENZONATATE 100 MG PO CAPS
100.0000 mg | ORAL_CAPSULE | Freq: Three times a day (TID) | ORAL | 0 refills | Status: DC
Start: 2020-04-18 — End: 2022-02-17

## 2020-04-18 NOTE — ED Triage Notes (Signed)
Sore throat and cough since Thursday.  No fevers.

## 2020-04-18 NOTE — ED Provider Notes (Signed)
[facility]    CSN: [id number] Arrival date & time: 04/18/20  8841      History   Chief Complaint Chief Complaint  Patient presents with  . Sore Throat  . Cough    HPI Rebecca Ochoa is a 59 y.o. female with history of GERD, migraines, asthma presenting for sore throat and dry cough since Thursday.  Denies chest pain, palpitations, difficulty breathing, fever, arthralgias, myalgias.  No known sick contacts.  Did not receive Covid vaccine.  Has taken OTC medications without relief.  No difficulty breathing, swallowing, drooling, dental pain.   Past Medical History:  Diagnosis Date  . Anemia   . Asthma, mild intermittent, well-controlled   . GERD (gastroesophageal reflux disease)   . Hx of migraines   . Hyperlipidemia   . Vitamin D deficiency     Patient Active Problem List   Diagnosis Date Noted  . Other abnormal glucose 01/28/2018  . Anxiety 01/28/2018  . Asthma 05/31/2016  . Tobacco dependence 12/01/2015  . Noncompliance 12/01/2015  . Hx of migraines   . Hyperlipidemia   . Anemia   . Vitamin D deficiency     Past Surgical History:  Procedure Laterality Date  . BREAST SURGERY     reduction  . TUBAL LIGATION      OB History   No obstetric history on file.      Home Medications    Prior to Admission medications   Medication Sig Start Date End Date Taking? Authorizing Provider  ALPRAZolam Prudy Feeler) 1 MG tablet Take 1/2 - 1 tablet  1 - 2 x /day ONLY if needed for Anxiety Attack &  limit to 5 days /week to avoid Addiction & Dementia 03/26/20   Judd Gaudier, NP  aspirin 81 MG chewable tablet Chew by mouth daily. Patient not taking: Reported on 04/18/2020    [provider]  benzonatate (TESSALON) 100 MG capsule Take 1 capsule (100 mg total) by mouth every 8 (eight) hours. 04/18/20   Hall-Potvin, [place], PA-C  Cholecalciferol 1.25 MG (50000 UT) capsule Take one tablet by mouth three days a week for twelve weeks. 10/03/19   Elder Negus,  NP    Family History Family History  Problem Relation Age of Onset  . Heart disease Father   . Hypertension Father   . Diabetes Father   . Kidney disease Father     Social History Social History   Tobacco Use  . Smoking status: Current Every Day Smoker    Types: E-cigarettes  . Smokeless tobacco: Never Used  Vaping Use  . Vaping Use: Every day  Substance Use Topics  . Alcohol use: Yes    Alcohol/week: 3.0 standard drinks    Types: 3 Standard drinks or equivalent per week  . Drug use: Never     Allergies   Biaxin [clarithromycin]   Review of Systems As per HPI   Physical Exam Triage Vital Signs ED Triage Vitals  Enc Vitals Group     BP      Pulse      Resp      Temp      Temp src      SpO2      Weight      Height      Head Circumference      Peak Flow      Pain Score      Pain Loc      Pain Edu?      Excl. in GC?  No data found.  Updated Vital Signs BP 127/61 (BP Location: Left Arm)   Pulse 76   Temp 98.3 F (36.8 C) (Oral)   Resp 15   Wt 142 lb (64.4 kg)   SpO2 96%   BMI 23.27 kg/m   Visual Acuity Right Eye Distance:   Left Eye Distance:   Bilateral Distance:    Right Eye Near:   Left Eye Near:    Bilateral Near:     Physical Exam Constitutional:      General: She is not in acute distress.    Appearance: She is well-developed and normal weight. She is not ill-appearing or diaphoretic.  HENT:     Head: Normocephalic and atraumatic.     Right Ear: Tympanic membrane and ear canal normal.     Left Ear: Tympanic membrane and ear canal normal.     Mouth/Throat:     Mouth: Mucous membranes are moist.     Pharynx: Oropharynx is clear. No oropharyngeal exudate or posterior oropharyngeal erythema.     Tonsils: No tonsillar exudate. 1+ on the right. 1+ on the left.  Eyes:     General: No scleral icterus.    Conjunctiva/sclera: Conjunctivae normal.     Pupils: Pupils are equal, round, and reactive to light.  Neck:     Comments:  Trachea midline, negative JVD Cardiovascular:     Rate and Rhythm: Normal rate and regular rhythm.     Heart sounds: No murmur heard.  No gallop.   Pulmonary:     Effort: Pulmonary effort is normal. No respiratory distress.     Breath sounds: No wheezing, rhonchi or rales.  Musculoskeletal:     Cervical back: Neck supple. No tenderness.  Lymphadenopathy:     Cervical: No cervical adenopathy.  Skin:    Capillary Refill: Capillary refill takes less than 2 seconds.     Coloration: Skin is not jaundiced or pale.     Findings: No rash.  Neurological:     General: No focal deficit present.     Mental Status: She is alert and oriented to person, place, and time.      UC Treatments / Results  Labs (all labs ordered are listed, but only abnormal results are displayed) Labs Reviewed  POCT RAPID STREP A (OFFICE) - Normal  CULTURE, GROUP A STREP ([place])  NOVEL CORONAVIRUS, NAA    EKG   Radiology No results found.  Procedures Procedures (including critical care time)  Medications Ordered in UC Medications - No data to display  Initial Impression / Assessment and Plan / UC Course  I have reviewed the triage vital signs and the nursing notes.  Pertinent labs & imaging results that were available during my care of the patient were reviewed by me and considered in my medical decision making (see chart for details).     Patient afebrile, nontoxic, with SpO2 96%.  Rapid strep negative, culture pending Covid PCR pending.  Patient to quarantine until results are back.  We will treat supportively as outlined below.  Return precautions discussed, patient verbalized understanding and is agreeable to plan.   Per chart review, last CBC and CMP performed in June 2021: WNL. Final Clinical Impressions(s) / UC Diagnoses   Final diagnoses:  Cough  Sore throat     Discharge Instructions     Your rapid strep test was negative today.  The culture is pending.  Please look on your MyChart  for test results.   We will notify you  if the culture positive and outline a treatment plan at that time.   Please continue Tylenol and/or Ibuprofen as needed for fever, pain.  May try warm salt water gargles, cepacol lozenges, throat spray, warm tea or water with lemon/honey, or OTC cold relief medicine for throat discomfort.   For congestion: take a daily anti-histamine like Zyrtec, Claritin, and a oral decongestant to help with post nasal drip that may be irritating your throat.   It is important to stay hydrated: drink plenty of fluids (primarily water) to keep your throat moisturized and help further relieve irritation/discomfort.     ED Prescriptions    Medication Sig Dispense Auth. Provider   benzonatate (TESSALON) 100 MG capsule Take 1 capsule (100 mg total) by mouth every 8 (eight) hours. 21 capsule Hall-Potvin, [place], PA-C     PDMP not reviewed this encounter.   Hall-Potvin, [place], [place] 04/18/20 1042

## 2020-04-18 NOTE — Discharge Instructions (Signed)
Your rapid strep test was negative today.  The culture is pending.  Please look on your MyChart for test results.   We will notify you if the culture positive and outline a treatment plan at that time.   Please continue Tylenol and/or Ibuprofen as needed for fever, pain.  May try warm salt water gargles, cepacol lozenges, throat spray, warm tea or water with lemon/honey, or OTC cold relief medicine for throat discomfort.    For congestion: take a daily anti-histamine like Zyrtec, Claritin, and a oral decongestant to help with post nasal drip that may be irritating your throat.   It is important to stay hydrated: drink plenty of fluids (primarily water) to keep your throat moisturized and help further relieve irritation/discomfort.  

## 2020-04-19 LAB — NOVEL CORONAVIRUS, NAA: SARS-CoV-2, NAA: NOT DETECTED

## 2020-04-19 LAB — SARS-COV-2, NAA 2 DAY TAT

## 2020-04-21 LAB — CULTURE, GROUP A STREP (THRC)

## 2020-05-25 ENCOUNTER — Other Ambulatory Visit: Payer: Self-pay

## 2020-05-25 DIAGNOSIS — F419 Anxiety disorder, unspecified: Secondary | ICD-10-CM

## 2020-05-26 MED ORDER — ALPRAZOLAM 1 MG PO TABS: ORAL_TABLET | ORAL | 0 refills | Status: DC

## 2020-07-22 ENCOUNTER — Other Ambulatory Visit: Payer: Self-pay

## 2020-07-22 DIAGNOSIS — F419 Anxiety disorder, unspecified: Secondary | ICD-10-CM

## 2020-07-22 MED ORDER — ALPRAZOLAM 1 MG PO TABS: ORAL_TABLET | ORAL | 0 refills | Status: DC

## 2020-09-27 ENCOUNTER — Other Ambulatory Visit: Payer: Self-pay

## 2020-09-27 DIAGNOSIS — F419 Anxiety disorder, unspecified: Secondary | ICD-10-CM

## 2020-09-28 ENCOUNTER — Other Ambulatory Visit: Payer: Self-pay | Admitting: Adult Health Nurse Practitioner

## 2020-09-28 MED ORDER — ALPRAZOLAM 1 MG PO TABS: ORAL_TABLET | ORAL | 0 refills | Status: DC

## 2020-10-06 ENCOUNTER — Encounter: Payer: BC Managed Care – PPO | Admitting: Adult Health Nurse Practitioner

## 2020-10-12 ENCOUNTER — Other Ambulatory Visit: Payer: Self-pay

## 2020-10-12 ENCOUNTER — Ambulatory Visit (INDEPENDENT_AMBULATORY_CARE_PROVIDER_SITE_OTHER): Payer: BC Managed Care – PPO | Admitting: Adult Health Nurse Practitioner

## 2020-10-12 ENCOUNTER — Encounter: Payer: Self-pay | Admitting: Adult Health Nurse Practitioner

## 2020-10-12 VITALS — BP 122/84 | HR 86 | Temp 97.7°F | Ht 66.0 in | Wt 151.0 lb

## 2020-10-12 DIAGNOSIS — F419 Anxiety disorder, unspecified: Secondary | ICD-10-CM

## 2020-10-12 DIAGNOSIS — K581 Irritable bowel syndrome with constipation: Secondary | ICD-10-CM

## 2020-10-12 DIAGNOSIS — Z13220 Encounter for screening for lipoid disorders: Secondary | ICD-10-CM | POA: Diagnosis not present

## 2020-10-12 DIAGNOSIS — Z0001 Encounter for general adult medical examination with abnormal findings: Secondary | ICD-10-CM

## 2020-10-12 DIAGNOSIS — Z130 Encounter for screening for diseases of the blood and blood-forming organs and certain disorders involving the immune mechanism: Secondary | ICD-10-CM

## 2020-10-12 DIAGNOSIS — Z136 Encounter for screening for cardiovascular disorders: Secondary | ICD-10-CM

## 2020-10-12 DIAGNOSIS — D649 Anemia, unspecified: Secondary | ICD-10-CM

## 2020-10-12 DIAGNOSIS — E559 Vitamin D deficiency, unspecified: Secondary | ICD-10-CM

## 2020-10-12 DIAGNOSIS — Z1329 Encounter for screening for other suspected endocrine disorder: Secondary | ICD-10-CM | POA: Diagnosis not present

## 2020-10-12 DIAGNOSIS — Z6824 Body mass index (BMI) 24.0-24.9, adult: Secondary | ICD-10-CM

## 2020-10-12 DIAGNOSIS — F17200 Nicotine dependence, unspecified, uncomplicated: Secondary | ICD-10-CM

## 2020-10-12 DIAGNOSIS — Z131 Encounter for screening for diabetes mellitus: Secondary | ICD-10-CM

## 2020-10-12 DIAGNOSIS — R7309 Other abnormal glucose: Secondary | ICD-10-CM

## 2020-10-12 DIAGNOSIS — Z23 Encounter for immunization: Secondary | ICD-10-CM

## 2020-10-12 DIAGNOSIS — Z1321 Encounter for screening for nutritional disorder: Secondary | ICD-10-CM

## 2020-10-12 DIAGNOSIS — Z1211 Encounter for screening for malignant neoplasm of colon: Secondary | ICD-10-CM

## 2020-10-12 DIAGNOSIS — Z1389 Encounter for screening for other disorder: Secondary | ICD-10-CM | POA: Diagnosis not present

## 2020-10-12 DIAGNOSIS — Z0000 Encounter for general adult medical examination without abnormal findings: Secondary | ICD-10-CM | POA: Diagnosis not present

## 2020-10-12 DIAGNOSIS — Z8669 Personal history of other diseases of the nervous system and sense organs: Secondary | ICD-10-CM

## 2020-10-12 DIAGNOSIS — I10 Essential (primary) hypertension: Secondary | ICD-10-CM

## 2020-10-12 DIAGNOSIS — E785 Hyperlipidemia, unspecified: Secondary | ICD-10-CM

## 2020-10-12 NOTE — Progress Notes (Signed)
COMPLETE PHYSICAL  Assessment and Plan:   Rebecca Ochoa was seen today for complete physical Diagnoses and all orders for this visit:  Encounter for routine adult medical exam with abnormal findings Yearly -     CBC with Differential/Platelet -     COMPLETE METABOLIC PANEL WITH GFR  Hyperlipidemia, unspecified hyperlipidemia type Discussed dietary and exercise modifications Has not made any dietary changes Discussed metamucil Defer labs till next OV 6 months.  Anemia, unspecified type Not taking supplements at this time -iron panel  Vitamin D deficiency Completed Rx 50,000IU -     Vitamin D (25 hydroxy) Dose pending lab results  Tobacco dependence Discussed risks associated with tobacco use and advised to reduce or quit - she is no longer smoking cigarettes but continues to vape daily - reports no longer has smoker's cough or AM secretions/recurrent infections Patient is not ready to do so, but advised to consider strongly  Will follow up at the next OV  Irritable bowel syndrome with constipation Doing well at this time Controlled by diet, monitoring Metamucil will also help with this.  Anxiety Doing well on current regiment Continue : alprazolam 1/2 to one tablet daily Discussed stress management techniques   Discussed good sleep hygiene Discussed increasing physical activity and exercise Increase water intake  Hx of migraines Doing well at this time, no medications, OTC if needed No recent flares  Other abnormal glucose Discussed dietary and exercise modifications Will check A1c at next OV  BMI 24.0-24.9, adult Discussed dietary and exercise modifications  Need for Tdap vaccination Received today  Medication management - Continued  Screening for colorectal cancer -Rx Cologuard Discussed with patient  Screening for thyroid disorder -     TSH  Screening for blood or protein in urine -     Urinalysis w microscopic + reflex cultur  Screening for  diabetes mellitus -     Hemoglobin A1c  Screening, iron deficiency anemia -     Iron,Total/Total Iron Binding Cap  Encounter for vitamin deficiency screening -     Vitamin B12  Screening cholesterol level -     Lipid panel  Screening, ischemic heart disease -     EKG 12-Lead  Need for Tdap vaccination -     Tdap vaccine greater than or equal to 7yo IM  Screening for colorectal cancer -     Cologuard    Continue diet and meds as discussed. Further disposition pending results of labs. Discussed med's effects and SE's.   Over 40 minutes of face to face exam, interview, counseling, chart review, and critical decision making was performed.   Future Appointments  Date Time Provider Department Center  10/12/2021  3:00 PM Elder Negus, NP [facility] None    ----------------------------------------------------------------------------------------------------------------------  HPI 59 y.o. female  presents for complete physical and follow up on anxiety, smoking, HLD and vitamin D deficiency.   Reports overall she is doing well.  No health or medication concerns today.  She has a diagnosis of anxiety and taking xanax 1 mg BID PRN, reports symptoms are well controlled on current regimen. She reports anxiety is much improved; typically takes 1 mg at night for sleep, and very rarely takes if she has a severely stressful day at work, or if traveling by air.   She does not smoke but continues to vape 3mg ; discussed risks associated with smoking, patient is not ready to quit.   Reports Mom had a massive stroke and she is not able to use her left  side. She is going to rehab although she is not improving.  She passed away early Mar 04, 2020.  She has been dealing with this but has been difficult for her.    BMI is Body mass index is 24.37 kg/m., she has not been working on diet and exercise, but plans to start going to the gym with her teenage son. Wt Readings from Last 3 Encounters:   10/12/20 151 lb (68.5 kg)  04/18/20 142 lb (64.4 kg)  03/31/20 142 lb 12.8 oz (64.8 kg)   Today their BP is BP: 122/84 She denies chest pain, shortness of breath, dizziness.   She is not on cholesterol medication and denies myalgias. Her cholesterol is not at goal. The cholesterol last visit was:   Lab Results  Component Value Date   CHOL 227 10/01/2019   HDL 62 10/01/2019   LDLCALC 142 (H) 10/01/2019   TRIG 122 10/01/2019   CHOLHDL 3.7 10/01/2019    She has been working on diet and exercise for glucose management, and denies foot ulcerations, hyperglycemia, increased appetite, nausea, paresthesia of the feet, polydipsia, polyuria, visual disturbances, vomiting and weight loss. Last A1C in the office was:  Lab Results  Component Value Date   HGBA1C 5.2 10/01/2019   Patient has hx of vitamin D deficiency and completed Rx of 50,000iu three days a week for 12 weeks.  Not taking any supplement at this time. Lab Results  Component Value Date   VD25OH 16 10/01/2019        Current Medications:  Current Outpatient Medications on File Prior to Visit  Medication Sig  . ALPRAZolam (XANAX) 1 MG tablet Take 1/2 - 1 tablet  1 - 2 x /day ONLY if needed for Anxiety Attack &  limit to 5 days /week to avoid Addiction & Dementia  . aspirin 81 MG chewable tablet Chew by mouth daily.  . Cholecalciferol 1.25 MG (50000 UT) capsule Take one tablet by mouth three days a week for twelve weeks.  . benzonatate (TESSALON) 100 MG capsule Take 1 capsule (100 mg total) by mouth every 8 (eight) hours. (Patient not taking: Reported on 10/12/2020)   No current facility-administered medications on file prior to visit.     Allergies:  Allergies  Allergen Reactions  . Biaxin [Clarithromycin] Nausea Only     Medical History:  Past Medical History:  Diagnosis Date  . Anemia   . Asthma, mild intermittent, well-controlled   . GERD (gastroesophageal reflux disease)   . Hx of migraines   . Hyperlipidemia   .  Vitamin D deficiency    Family history- Reviewed and unchanged Social history- Reviewed and unchanged    Names of Other Physician/Practitioners you currently use: 1. [facility] here for primary care 2.Eye Exam: DUE 3. Dental exam: DUE  Patient Care Team: [name], MD as PCP - General (Internal Medicine) Hanley Seamen Dustin Folks, MD as Referring Physician (Optometry) Ilda Mori, MD as Consulting Physician (Obstetrics and Gynecology) Arminda Resides, MD as Consulting Physician (Dermatology)    Screening Tests: Immunization History  Administered Date(s) Administered  . PPD Test 12/08/2014  . Pneumococcal Polysaccharide-23 10/31/1996  . Td 10/31/2009    Preventative care: Last colonoscopy: DUE Last mammogram: Mar 05, 2007, DUE Last pap smear/pelvic exam: 6/ LMP: 10 years ago.   Vaccinations: TD or Tdap: 09/2020, received today. Influenza: Declined Pneumococcal: 10/1996 Prevnar13: N/A Shingles/Zostavax: Discussed   Review of Systems:  Review of Systems  Constitutional: Negative for malaise/fatigue and weight loss.  HENT:  Negative for hearing loss and tinnitus.   Eyes: Negative for blurred vision and double vision.  Respiratory: Negative for cough, shortness of breath and wheezing.   Cardiovascular: Negative for chest pain, palpitations, orthopnea, claudication and leg swelling.  Gastrointestinal: Negative for abdominal pain, blood in stool, constipation, diarrhea, heartburn, melena, nausea and vomiting.  Genitourinary: Negative.   Musculoskeletal: Negative for joint pain and myalgias.  Skin: Negative for rash.  Neurological: Negative for dizziness, tingling, sensory change, weakness and headaches.  Endo/Heme/Allergies: Negative for polydipsia.  Psychiatric/Behavioral: Negative.   All other systems reviewed and are negative.     Physical Exam: BP 122/84   Pulse 86   Temp 97.7 F (36.5 C)   Ht 5\' 6"  (1.676 m)   Wt 151 lb  (68.5 kg)   SpO2 98%   BMI 24.37 kg/m  Wt Readings from Last 3 Encounters:  10/12/20 151 lb (68.5 kg)  04/18/20 142 lb (64.4 kg)  03/31/20 142 lb 12.8 oz (64.8 kg)   General Appearance: Well nourished, in no apparent distress. Eyes: PERRLA, EOMs, conjunctiva no swelling or erythema Sinuses: No Frontal/maxillary tenderness ENT/Mouth: Ext aud canals clear, TMs without erythema, bulging. No erythema, swelling, or exudate on post pharynx.  Tonsils not swollen or erythematous. Hearing normal.  Neck: Supple, thyroid normal.  Respiratory: Respiratory effort normal, BS equal bilaterally without rales, rhonchi, wheezing or stridor.  Cardio: RRR with no MRGs. Brisk peripheral pulses without edema.  Abdomen: Soft, + BS.  Non tender, no guarding, rebound, hernias, masses. Lymphatics: Non tender without lymphadenopathy.  Musculoskeletal: Full ROM, 5/5 strength, Normal gait Skin: Warm, dry without rashes, lesions, ecchymosis. She has some thickening of base of right 5th toe with mild tenderness to palpation on sole.  Neuro: Cranial nerves intact. No cerebellar symptoms.  Psych: Awake and oriented X 3, normal affect, Insight and Judgment appropriate.      05/31/20, NP 3:45 PM [facility]

## 2020-10-12 NOTE — Patient Instructions (Addendum)
You received Tentanus (Tdap) vaccination today 10/12/20.   Complete the Cologuard before the end of the year for colorectal cancer screening.  You are due for Mammogram  Yearly Dental and eye exam      For the new year 2022 consider Shingrix vaccination.   Ask insurance and pharmacy about shingrix - it is a 2 part shot that we will not be getting in the office.   Suggest getting AFTER covid vaccines, have to wait at least a month This shot can make you feel bad due to such good immune response it can trigger some inflammation so take tylenol or aleve day of or day after and plan on resting.   Can go to [email or web address] for more information  Shingrix Vaccination  Two vaccines are licensed and recommended to prevent shingles in the [place] Zoster vaccine live (ZVL, Zostavax) has been in use since 2006. Recombinant zoster vaccine (RZV, Shingrix), has been in use since 2017 and is recommended by [employer] as the preferred shingles vaccine.  What Everyone Should Know about Shingles Vaccine (Shingrix) One of the Recommended Vaccines by Disease Shingles vaccination is the only way to protect against shingles and postherpetic neuralgia (PHN), the most common complication from shingles. CDC recommends that healthy adults 50 years and older get two doses of the shingles vaccine called Shingrix (recombinant zoster vaccine), separated by 2 to 6 months, to prevent shingles and the complications from the disease. Your doctor or pharmacist can give you Shingrix as a shot in your upper arm. Shingrix provides strong protection against shingles and PHN. Two doses of Shingrix is more than 90% effective at preventing shingles and PHN. Protection stays above 85% for at least the first four years after you get vaccinated. Shingrix is the preferred vaccine, over Zostavax (zoster vaccine live), a shingles vaccine in use since 2006. Zostavax may still be used to  prevent shingles in healthy adults 60 years and older. For example, you could use Zostavax if a person is allergic to Shingrix, prefers Zostavax, or requests immediate vaccination and Shingrix is unavailable. Who Should Get Shingrix? Healthy adults 50 years and older should get two doses of Shingrix, separated by 2 to 6 months. You should get Shingrix even if in the past you . had shingles  . received Zostavax  . are not sure if you had chickenpox There is no maximum age for getting Shingrix. If you had shingles in the past, you can get Shingrix to help prevent future occurrences of the disease. There is no specific length of time that you need to wait after having shingles before you can receive Shingrix, but generally you should make sure the shingles rash has gone away before getting vaccinated. You can get Shingrix whether or not you remember having had chickenpox in the past. Studies show that more than 99% of Americans 40 years and older have had chickenpox, even if they don't remember having the disease. Chickenpox and shingles are related because they are caused by the same virus (varicella zoster virus). After a person recovers from chickenpox, the virus stays dormant (inactive) in the body. It can reactivate years later and cause shingles. If you had Zostavax in the recent past, you should wait at least eight weeks before getting Shingrix. Talk to your healthcare provider to determine the best time to get Shingrix. Shingrix is available in [employer] and pharmacies. To find doctor's offices or pharmacies near you that offer the vaccine, visit HealthMap Vaccine FinderExternal. If you have questions about  Shingrix, talk with your healthcare provider. Vaccine for Those 50 Years and Older  Shingrix reduces the risk of shingles and PHN by more than 90% in people 67 and older. CDC recommends the vaccine for healthy adults 50 and older.  Who Should Not Get Shingrix? You should not get  Shingrix if you: . have ever had a severe allergic reaction to any component of the vaccine or after a dose of Shingrix  . tested negative for immunity to varicella zoster virus. If you test negative, you should get chickenpox vaccine.  . currently have shingles  . currently are pregnant or breastfeeding. Women who are pregnant or breastfeeding should wait to get Shingrix.  Marland Kitchen receive specific antiviral drugs (acyclovir, famciclovir, or valacyclovir) 24 hours before vaccination (avoid use of these antiviral drugs for 14 days after vaccination)- zoster vaccine live only If you have a minor acute (starts suddenly) illness, such as a cold, you may get Shingrix. But if you have a moderate or severe acute illness, you should usually wait until you recover before getting the vaccine. This includes anyone with a temperature of 101.67F or higher. The side effects of the Shingrix are temporary, and usually last 2 to 3 days. While you may experience pain for a few days after getting Shingrix, the pain will be less severe than having shingles and the complications from the disease. How Well Does Shingrix Work? Two doses of Shingrix provides strong protection against shingles and postherpetic neuralgia (PHN), the most common complication of shingles. . In adults 21 to 59 years old who got two doses, Shingrix was 97% effective in preventing shingles; among adults 70 years and older, Shingrix was 91% effective.  . In adults 3 to 59 years old who got two doses, Shingrix was 91% effective in preventing PHN; among adults 70 years and older, Shingrix was 89% effective. Shingrix protection remained high (more than 85%) in people 70 years and older throughout the four years following vaccination. Since your risk of shingles and PHN increases as you get older, it is important to have strong protection against shingles in your older years. Top of Page  What Are the Possible Side Effects of Shingrix? Studies show that  Shingrix is safe. The vaccine helps your body create a strong defense against shingles. As a result, you are likely to have temporary side effects from getting the shots. The side effects may affect your ability to do normal daily activities for 2 to 3 days. Most people got a sore arm with mild or moderate pain after getting Shingrix, and some also had redness and swelling where they got the shot. Some people felt tired, had muscle pain, a headache, shivering, fever, stomach pain, or nausea. About 1 out of 6 people who got Shingrix experienced side effects that prevented them from doing regular activities. Symptoms went away on their own in about 2 to 3 days. Side effects were more common in younger people. You might have a reaction to the first or second dose of Shingrix, or both doses. If you experience side effects, you may choose to take over-the-counter pain medicine such as ibuprofen or acetaminophen. If you experience side effects from Shingrix, you should report them to the Vaccine Adverse Event Reporting System (VAERS). Your doctor might file this report, or you can do it yourself through the VAERS websiteExternal, or by calling [phone]. If you have any questions about side effects from Shingrix, talk with your doctor. The shingles vaccine does not contain thimerosal (a  preservative containing mercury). Top of Page  When Should I See a Doctor Because of the Side Effects I Experience From Shingrix? In clinical trials, Shingrix was not associated with serious adverse events. In fact, serious side effects from vaccines are extremely rare. For example, for every 1 million doses of a vaccine given, only one or two people may have a severe allergic reaction. Signs of an allergic reaction happen within minutes or hours after vaccination and include hives, swelling of the face and throat, difficulty breathing, a fast heartbeat, dizziness, or weakness. If you experience these or any other  life-threatening symptoms, see a doctor right away. Shingrix causes a strong response in your immune system, so it may produce short-term side effects more intense than you are used to from other vaccines. These side effects can be uncomfortable, but they are expected and usually go away on their own in 2 or 3 days. Top of Page  How Can I Pay For Shingrix? There are several ways shingles vaccine may be paid for: Medicare . Medicare Part D plans cover the shingles vaccine, but there may be a cost to you depending on your plan. There may be a copay for the vaccine, or you may need to pay in full then get reimbursed for a certain amount.  . Medicare Part B does not cover the shingles vaccine. Medicaid . Medicaid may or may not cover the vaccine. Contact your insurer to find out. Private health insurance . Many private health insurance plans will cover the vaccine, but there may be a cost to you depending on your plan. Contact your insurer to find out. Vaccine assistance programs . Some pharmaceutical companies provide vaccines to eligible adults who cannot afford them. You may want to check with the vaccine manufacturer, [employer], about Shingrix. If you do not currently have health insurance, learn more about affordable health coverage optionsExternal. To find doctor's offices or pharmacies near you that offer the vaccine, visit HealthMap Vaccine FinderExternal.

## 2020-10-14 LAB — CBC WITH DIFFERENTIAL/PLATELET
Absolute Monocytes: 561 cells/uL (ref 200–950)
Basophils Absolute: 21 cells/uL (ref 0–200)
Basophils Relative: 0.3 %
Eosinophils Absolute: 121 cells/uL (ref 15–500)
Eosinophils Relative: 1.7 %
HCT: 39.5 % (ref 35.0–45.0)
Hemoglobin: 13.8 g/dL (ref 11.7–15.5)
Lymphs Abs: 1512 cells/uL (ref 850–3900)
MCH: 33.2 pg — ABNORMAL HIGH (ref 27.0–33.0)
MCHC: 34.9 g/dL (ref 32.0–36.0)
MCV: 95 fL (ref 80.0–100.0)
MPV: 11.8 fL (ref 7.5–12.5)
Monocytes Relative: 7.9 %
Neutro Abs: 4885 cells/uL (ref 1500–7800)
Neutrophils Relative %: 68.8 %
Platelets: 242 10*3/uL (ref 140–400)
RBC: 4.16 10*6/uL (ref 3.80–5.10)
RDW: 11.3 % (ref 11.0–15.0)
Total Lymphocyte: 21.3 %
WBC: 7.1 10*3/uL (ref 3.8–10.8)

## 2020-10-14 LAB — URINALYSIS W MICROSCOPIC + REFLEX CULTURE
Bacteria, UA: NONE SEEN /HPF
Bilirubin Urine: NEGATIVE
Glucose, UA: NEGATIVE
Hgb urine dipstick: NEGATIVE
Hyaline Cast: NONE SEEN /LPF
Ketones, ur: NEGATIVE
Nitrites, Initial: NEGATIVE
Protein, ur: NEGATIVE
RBC / HPF: NONE SEEN /HPF (ref 0–2)
Specific Gravity, Urine: 1.003 (ref 1.001–1.03)
pH: 7.5 (ref 5.0–8.0)

## 2020-10-14 LAB — COMPLETE METABOLIC PANEL WITH GFR
AG Ratio: 1.2 (calc) (ref 1.0–2.5)
ALT: 11 U/L (ref 6–29)
AST: 15 U/L (ref 10–35)
Albumin: 4.3 g/dL (ref 3.6–5.1)
Alkaline phosphatase (APISO): 101 U/L (ref 37–153)
BUN: 7 mg/dL (ref 7–25)
CO2: 30 mmol/L (ref 20–32)
Calcium: 10.1 mg/dL (ref 8.6–10.4)
Chloride: 102 mmol/L (ref 98–110)
Creat: 0.79 mg/dL (ref 0.50–1.05)
GFR, Est African American: 95 mL/min/{1.73_m2} (ref >=60)
GFR, Est Non African American: 82 mL/min/{1.73_m2} (ref >=60)
Globulin: 3.5 g/dL (calc) (ref 1.9–3.7)
Glucose, Bld: 92 mg/dL (ref 65–99)
Potassium: 4.9 mmol/L (ref 3.5–5.3)
Sodium: 142 mmol/L (ref 135–146)
Total Bilirubin: 0.4 mg/dL (ref 0.2–1.2)
Total Protein: 7.8 g/dL (ref 6.1–8.1)

## 2020-10-14 LAB — LIPID PANEL
Cholesterol: 207 mg/dL — ABNORMAL HIGH (ref <200)
HDL: 71 mg/dL (ref >=50)
LDL Cholesterol (Calc): 108 mg/dL (calc) — ABNORMAL HIGH
Non-HDL Cholesterol (Calc): 136 mg/dL (calc) — ABNORMAL HIGH (ref <130)
Total CHOL/HDL Ratio: 2.9 (calc) (ref <5.0)
Triglycerides: 166 mg/dL — ABNORMAL HIGH (ref <150)

## 2020-10-14 LAB — URINE CULTURE
MICRO NUMBER:: 11312968
SPECIMEN QUALITY:: ADEQUATE

## 2020-10-14 LAB — IRON, TOTAL/TOTAL IRON BINDING CAP
%SAT: 21 % (calc) (ref 16–45)
Iron: 82 ug/dL (ref 45–160)
TIBC: 396 mcg/dL (calc) (ref 250–450)

## 2020-10-14 LAB — TSH: TSH: 1.27 mIU/L (ref 0.40–4.50)

## 2020-10-14 LAB — HEMOGLOBIN A1C
Hgb A1c MFr Bld: 5.1 % of total Hgb (ref <5.7)
Mean Plasma Glucose: 100 mg/dL
eAG (mmol/L): 5.5 mmol/L

## 2020-10-14 LAB — VITAMIN B12: Vitamin B-12: 378 pg/mL (ref 200–1100)

## 2020-10-14 LAB — VITAMIN D 25 HYDROXY (VIT D DEFICIENCY, FRACTURES): Vit D, 25-Hydroxy: 32 ng/mL (ref 30–100)

## 2020-10-14 LAB — CULTURE INDICATED

## 2020-11-23 ENCOUNTER — Other Ambulatory Visit: Payer: Self-pay

## 2020-11-23 DIAGNOSIS — F419 Anxiety disorder, unspecified: Secondary | ICD-10-CM

## 2020-11-24 MED ORDER — ALPRAZOLAM 1 MG PO TABS: ORAL_TABLET | ORAL | 0 refills | Status: DC

## 2021-01-17 ENCOUNTER — Other Ambulatory Visit: Payer: Self-pay

## 2021-01-17 DIAGNOSIS — F419 Anxiety disorder, unspecified: Secondary | ICD-10-CM

## 2021-01-18 MED ORDER — ALPRAZOLAM 1 MG PO TABS: ORAL_TABLET | ORAL | 0 refills | Status: DC

## 2021-03-01 ENCOUNTER — Other Ambulatory Visit: Payer: Self-pay | Admitting: Internal Medicine

## 2021-03-01 DIAGNOSIS — Z1231 Encounter for screening mammogram for malignant neoplasm of breast: Secondary | ICD-10-CM

## 2021-03-03 ENCOUNTER — Ambulatory Visit
Admission: RE | Admit: 2021-03-03 | Discharge: 2021-03-03 | Disposition: A | Discharge status: Home / Self Care | Payer: BC Managed Care – PPO | Source: Ambulatory Visit | Attending: Internal Medicine | Admitting: Internal Medicine

## 2021-03-03 ENCOUNTER — Other Ambulatory Visit: Payer: Self-pay

## 2021-03-03 DIAGNOSIS — Z1231 Encounter for screening mammogram for malignant neoplasm of breast: Secondary | ICD-10-CM | POA: Diagnosis not present

## 2021-03-11 ENCOUNTER — Other Ambulatory Visit: Payer: Self-pay

## 2021-03-11 DIAGNOSIS — F419 Anxiety disorder, unspecified: Secondary | ICD-10-CM

## 2021-03-11 MED ORDER — ALPRAZOLAM 1 MG PO TABS: ORAL_TABLET | ORAL | 0 refills | Status: DC

## 2021-04-12 ENCOUNTER — Encounter: Payer: BC Managed Care – PPO | Admitting: Adult Health Nurse Practitioner

## 2021-05-06 ENCOUNTER — Other Ambulatory Visit: Payer: Self-pay

## 2021-05-06 DIAGNOSIS — F419 Anxiety disorder, unspecified: Secondary | ICD-10-CM

## 2021-05-07 MED ORDER — ALPRAZOLAM 1 MG PO TABS: ORAL_TABLET | ORAL | 0 refills | Status: DC

## 2021-07-12 ENCOUNTER — Other Ambulatory Visit: Payer: Self-pay

## 2021-07-12 DIAGNOSIS — F419 Anxiety disorder, unspecified: Secondary | ICD-10-CM

## 2021-07-13 MED ORDER — ALPRAZOLAM 1 MG PO TABS: ORAL_TABLET | ORAL | 0 refills | Status: DC

## 2021-09-27 ENCOUNTER — Other Ambulatory Visit: Payer: Self-pay | Admitting: Nurse Practitioner

## 2021-09-27 DIAGNOSIS — F419 Anxiety disorder, unspecified: Secondary | ICD-10-CM

## 2021-09-28 MED ORDER — ALPRAZOLAM 1 MG PO TABS: ORAL_TABLET | ORAL | 0 refills | Status: DC

## 2021-10-07 NOTE — Progress Notes (Addendum)
COMPLETE PHYSICAL  Assessment and Plan:   Rebecca Ochoa was seen today for complete physical Diagnoses and all orders for this visit:  Encounter for routine adult medical exam with abnormal findings Yearly -     CBC with Differential/Platelet -     COMPLETE METABOLIC PANEL WITH GFR  Hyperlipidemia, unspecified hyperlipidemia type Discussed dietary and exercise modifications Has not made any dietary changes Discussed metamucil Lipid panel CMP TSH  Anemia, unspecified type Not taking supplements at this time -CBC  Vitamin D deficiency Completed Rx 50,000IU -     Vitamin D (25 hydroxy) Dose pending lab results  Vaping nicotine dependence, tobacco product Long discussion on the harms of vaping, strongly encouraged to quit Pt is not currently ready to stop, reevaluate next visit  Irritable bowel syndrome with constipation Doing well at this time Controlled by diet, monitoring Metamucil will also help with this.  Anxiety Doing well on current regiment Continue : alprazolam 1/2 to one tablet daily Discussed stress management techniques   Discussed good sleep hygiene Discussed increasing physical activity and exercise Increase water intake  Hx of migraines Doing well at this time, no medications, OTC if needed No recent flares  Other abnormal glucose Discussed dietary and exercise modifications A1c  Overweight BMI 27 Discussed dietary and exercise modifications   Medication management - Magnesium  Screening for colorectal cancer -Rx Cologuard Discussed with patient   Screening for blood or protein in urine -     Routine urine with reflex microscopic - Microalbumin/creatinine urine ratio     Continue diet and meds as discussed. Further disposition pending results of labs. Discussed med's effects and SE's.   Over 40 minutes of face to face exam, interview, counseling, chart review, and critical decision making was performed.   Future Appointments  Date  Time Provider Department Center  10/12/2021  3:00 PM Revonda Humphrey, NP [facility] None  10/12/2022  3:00 PM Revonda Humphrey, NP [facility] None    ----------------------------------------------------------------------------------------------------------------------  HPI 60 y.o. female  presents for complete physical and follow up on anxiety, smoking, HLD and vitamin D deficiency.   Reports overall she is doing well.  No health or medication concerns today.  She does have occasional aching pain in knees bilaterally. She states Tylenol and Advil do not help.  She has a diagnosis of anxiety and taking xanax 1 mg BID PRN, reports symptoms are well controlled on current regimen. She reports anxiety is much improved; typically takes 1 mg at night for sleep, and very rarely takes if she has a severely stressful day at work, or if traveling by air.   She does not smoke but continues to vape 3mg ; discussed risks associated with smoking, patient is not ready to quit.   BMI is Body mass index is 27.76 kg/m., she has not been working on diet and exercise. She is currently not working out or watching her diet.  Wt Readings from Last 3 Encounters:  10/12/21 172 lb (78 kg)  10/12/20 151 lb (68.5 kg)  04/18/20 142 lb (64.4 kg)   Today their BP is BP: 138/64 BP Readings from Last 3 Encounters:  10/12/21 138/64  10/12/20 122/84  04/18/20 127/61    She denies chest pain, shortness of breath, dizziness.   She is not on cholesterol medication and denies myalgias. Her cholesterol is not at goal. The cholesterol last visit was:   Lab Results  Component Value Date   CHOL 227 10/01/2019   HDL 62 10/01/2019   LDLCALC  142 (H) 10/01/2019   TRIG 122 10/01/2019   CHOLHDL 3.7 10/01/2019    She has been working on diet and exercise for glucose management, and denies foot ulcerations, hyperglycemia, increased appetite, nausea, paresthesia of the feet, polydipsia, polyuria, visual disturbances, vomiting and weight  loss. Last A1C in the office was:  Lab Results  Component Value Date   HGBA1C 5.2 10/01/2019   Patient has hx of vitamin D deficiency and completed Rx of 50,000iu three days a week for 12 weeks.  Not taking any supplement at this time. Lab Results  Component Value Date   VD25OH 16 10/01/2019        Current Medications:  Current Outpatient Medications on File Prior to Visit  Medication Sig   ALPRAZolam (XANAX) 1 MG tablet Take 1/2 - 1 tablet  1 - 2 x /day ONLY if needed for Anxiety Attack &  limit to 5 days /week to avoid Addiction & Dementia   aspirin 81 MG chewable tablet Chew by mouth daily.   Cholecalciferol 1.25 MG (50000 UT) capsule Take one tablet by mouth three days a week for twelve weeks.   benzonatate (TESSALON) 100 MG capsule Take 1 capsule (100 mg total) by mouth every 8 (eight) hours. (Patient not taking: Reported on 10/12/2020)   No current facility-administered medications on file prior to visit.     Allergies:  Allergies  Allergen Reactions   Biaxin [Clarithromycin] Nausea Only     Medical History:  Past Medical History:  Diagnosis Date   Anemia    Asthma, mild intermittent, well-controlled    GERD (gastroesophageal reflux disease)    Hx of migraines    Hyperlipidemia    Vitamin D deficiency    Family history- Reviewed and unchanged Social history- Reviewed and unchanged    Names of Other Physician/Practitioners you currently use: 1. [facility] here for primary care 2.Eye Exam: 2022 3. Dental exam: 2022  Patient Care Team: [name], MD as PCP - General (Internal Medicine) Hanley Seamen Dustin Folks, MD as Referring Physician (Optometry) Ilda Mori, MD as Consulting Physician (Obstetrics and Gynecology) Arminda Resides, MD as Consulting Physician (Dermatology)    Screening Tests: Immunization History  Administered Date(s) Administered   PPD Test 12/08/2014   Pneumococcal Polysaccharide-23 10/31/1996    Td 10/31/2009   Tdap 10/12/2020    Preventative care: Last colonoscopy: Declines, will do Cologuard Last mammogram: 2022 Last pap smear/pelvic exam:  LMP: 10 years ago.   Vaccinations: TD or Tdap: 09/2020, received today. Influenza: Declined Pneumococcal: 10/1996 Prevnar13: N/A Shingles/Zostavax: Discussed   Review of Systems:  Review of Systems  Constitutional:  Negative for chills, fever, malaise/fatigue and weight loss.  HENT:  Negative for congestion, hearing loss, sinus pain, sore throat and tinnitus.   Eyes:  Negative for blurred vision and double vision.  Respiratory:  Negative for cough, hemoptysis, sputum production, shortness of breath and wheezing.   Cardiovascular:  Negative for chest pain, palpitations, orthopnea, claudication and leg swelling.  Gastrointestinal:  Positive for constipation. Negative for abdominal pain, blood in stool, diarrhea, heartburn, melena, nausea and vomiting.  Genitourinary: Negative.  Negative for dysuria and urgency.  Musculoskeletal:  Positive for joint pain (knees). Negative for back pain, falls, myalgias and neck pain.  Skin:  Negative for rash.  Neurological:  Negative for dizziness, tingling, tremors, sensory change, weakness and headaches.  Endo/Heme/Allergies:  Negative for polydipsia. Does not bruise/bleed easily.  Psychiatric/Behavioral:  Negative for depression and suicidal ideas. The patient is nervous/anxious.  The patient does not have insomnia.   All other systems reviewed and are negative.    Physical Exam: BP 138/64   Pulse 86   Temp (!) 97.3 F (36.3 C)   Ht 5\' 6"  (1.676 m)   Wt 172 lb (78 kg)   SpO2 97%   BMI 27.76 kg/m  Wt Readings from Last 3 Encounters:  10/12/21 172 lb (78 kg)  10/12/20 151 lb (68.5 kg)  04/18/20 142 lb (64.4 kg)   General Appearance: Well nourished, in no apparent distress. Eyes: PERRLA, EOMs, conjunctiva no swelling or erythema Sinuses: No Frontal/maxillary tenderness ENT/Mouth:  Ext aud canals clear, TMs without erythema, bulging. No erythema, swelling, or exudate on post pharynx.  Tonsils not swollen or erythematous. Hearing normal.  Neck: Supple, thyroid normal.  Respiratory: Respiratory effort normal, BS equal bilaterally without rales, rhonchi, wheezing or stridor.  Cardio: RRR with no MRGs. Brisk peripheral pulses without edema.  Abdomen: Soft, + BS.  Non tender, no guarding, rebound, hernias, masses. Lymphatics: Non tender without lymphadenopathy.  Musculoskeletal: Full ROM, 5/5 strength, Normal gait Skin: Warm, dry without rashes, lesions, ecchymosis. She has some thickening of base of right 5th toe with mild tenderness to palpation on sole.  Neuro: Cranial nerves intact. No cerebellar symptoms.  Psych: Awake and oriented X 3, normal affect, Insight and Judgment appropriate.     EKG: defer per pt request  Rebecca Ochoa 04/20/20, NP 3:45 PM [facility]

## 2021-10-12 ENCOUNTER — Ambulatory Visit (INDEPENDENT_AMBULATORY_CARE_PROVIDER_SITE_OTHER): Payer: BC Managed Care – PPO | Admitting: Nurse Practitioner

## 2021-10-12 ENCOUNTER — Encounter: Payer: Self-pay | Admitting: Nurse Practitioner

## 2021-10-12 ENCOUNTER — Other Ambulatory Visit: Payer: Self-pay

## 2021-10-12 VITALS — BP 138/64 | HR 86 | Temp 97.3°F | Ht 66.0 in | Wt 172.0 lb

## 2021-10-12 DIAGNOSIS — K581 Irritable bowel syndrome with constipation: Secondary | ICD-10-CM

## 2021-10-12 DIAGNOSIS — Z6827 Body mass index (BMI) 27.0-27.9, adult: Secondary | ICD-10-CM

## 2021-10-12 DIAGNOSIS — Z0000 Encounter for general adult medical examination without abnormal findings: Secondary | ICD-10-CM

## 2021-10-12 DIAGNOSIS — Z8669 Personal history of other diseases of the nervous system and sense organs: Secondary | ICD-10-CM

## 2021-10-12 DIAGNOSIS — Z13220 Encounter for screening for lipoid disorders: Secondary | ICD-10-CM | POA: Diagnosis not present

## 2021-10-12 DIAGNOSIS — Z1211 Encounter for screening for malignant neoplasm of colon: Secondary | ICD-10-CM

## 2021-10-12 DIAGNOSIS — Z131 Encounter for screening for diabetes mellitus: Secondary | ICD-10-CM | POA: Diagnosis not present

## 2021-10-12 DIAGNOSIS — F419 Anxiety disorder, unspecified: Secondary | ICD-10-CM

## 2021-10-12 DIAGNOSIS — Z6824 Body mass index (BMI) 24.0-24.9, adult: Secondary | ICD-10-CM

## 2021-10-12 DIAGNOSIS — Z136 Encounter for screening for cardiovascular disorders: Secondary | ICD-10-CM

## 2021-10-12 DIAGNOSIS — R7309 Other abnormal glucose: Secondary | ICD-10-CM

## 2021-10-12 DIAGNOSIS — E559 Vitamin D deficiency, unspecified: Secondary | ICD-10-CM | POA: Diagnosis not present

## 2021-10-12 DIAGNOSIS — F17200 Nicotine dependence, unspecified, uncomplicated: Secondary | ICD-10-CM

## 2021-10-12 DIAGNOSIS — Z0001 Encounter for general adult medical examination with abnormal findings: Secondary | ICD-10-CM

## 2021-10-12 DIAGNOSIS — Z1389 Encounter for screening for other disorder: Secondary | ICD-10-CM | POA: Diagnosis not present

## 2021-10-12 DIAGNOSIS — Z1329 Encounter for screening for other suspected endocrine disorder: Secondary | ICD-10-CM

## 2021-10-12 DIAGNOSIS — F17290 Nicotine dependence, other tobacco product, uncomplicated: Secondary | ICD-10-CM

## 2021-10-12 DIAGNOSIS — Z79899 Other long term (current) drug therapy: Secondary | ICD-10-CM

## 2021-10-12 DIAGNOSIS — E785 Hyperlipidemia, unspecified: Secondary | ICD-10-CM

## 2021-10-12 DIAGNOSIS — D649 Anemia, unspecified: Secondary | ICD-10-CM

## 2021-10-13 ENCOUNTER — Other Ambulatory Visit: Payer: Self-pay | Admitting: Nurse Practitioner

## 2021-10-13 DIAGNOSIS — E559 Vitamin D deficiency, unspecified: Secondary | ICD-10-CM

## 2021-10-13 LAB — COMPLETE METABOLIC PANEL WITH GFR
AG Ratio: 1.3 (calc) (ref 1.0–2.5)
ALT: 18 U/L (ref 6–29)
AST: 17 U/L (ref 10–35)
Albumin: 4.2 g/dL (ref 3.6–5.1)
Alkaline phosphatase (APISO): 101 U/L (ref 37–153)
BUN: 13 mg/dL (ref 7–25)
CO2: 28 mmol/L (ref 20–32)
Calcium: 9.5 mg/dL (ref 8.6–10.4)
Chloride: 102 mmol/L (ref 98–110)
Creat: 0.84 mg/dL (ref 0.50–1.05)
Globulin: 3.2 g/dL (calc) (ref 1.9–3.7)
Glucose, Bld: 87 mg/dL (ref 65–99)
Potassium: 4.3 mmol/L (ref 3.5–5.3)
Sodium: 139 mmol/L (ref 135–146)
Total Bilirubin: 0.4 mg/dL (ref 0.2–1.2)
Total Protein: 7.4 g/dL (ref 6.1–8.1)
eGFR: 80 mL/min/{1.73_m2} (ref >=60)

## 2021-10-13 LAB — LIPID PANEL
Cholesterol: 195 mg/dL (ref <200)
HDL: 51 mg/dL (ref >=50)
LDL Cholesterol (Calc): 100 mg/dL (calc) — ABNORMAL HIGH
Non-HDL Cholesterol (Calc): 144 mg/dL (calc) — ABNORMAL HIGH (ref <130)
Total CHOL/HDL Ratio: 3.8 (calc) (ref <5.0)
Triglycerides: 327 mg/dL — ABNORMAL HIGH (ref <150)

## 2021-10-13 LAB — MICROALBUMIN / CREATININE URINE RATIO
Creatinine, Urine: 35 mg/dL (ref 20–275)
Microalb, Ur: <0.2 mg/dL

## 2021-10-13 LAB — URINALYSIS, ROUTINE W REFLEX MICROSCOPIC
Bilirubin Urine: NEGATIVE
Glucose, UA: NEGATIVE
Hgb urine dipstick: NEGATIVE
Ketones, ur: NEGATIVE
Leukocytes,Ua: NEGATIVE
Nitrite: NEGATIVE
Protein, ur: NEGATIVE
Specific Gravity, Urine: 1.007 (ref 1.001–1.035)
pH: 6 (ref 5.0–8.0)

## 2021-10-13 LAB — CBC WITH DIFFERENTIAL/PLATELET
Absolute Monocytes: 591 cells/uL (ref 200–950)
Basophils Absolute: 22 cells/uL (ref 0–200)
Basophils Relative: 0.3 %
Eosinophils Absolute: 139 cells/uL (ref 15–500)
Eosinophils Relative: 1.9 %
HCT: 39.6 % (ref 35.0–45.0)
Hemoglobin: 13.3 g/dL (ref 11.7–15.5)
Lymphs Abs: 1460 cells/uL (ref 850–3900)
MCH: 32.4 pg (ref 27.0–33.0)
MCHC: 33.6 g/dL (ref 32.0–36.0)
MCV: 96.6 fL (ref 80.0–100.0)
MPV: 12 fL (ref 7.5–12.5)
Monocytes Relative: 8.1 %
Neutro Abs: 5088 cells/uL (ref 1500–7800)
Neutrophils Relative %: 69.7 %
Platelets: 222 10*3/uL (ref 140–400)
RBC: 4.1 10*6/uL (ref 3.80–5.10)
RDW: 12 % (ref 11.0–15.0)
Total Lymphocyte: 20 %
WBC: 7.3 10*3/uL (ref 3.8–10.8)

## 2021-10-13 LAB — MAGNESIUM: Magnesium: 2.1 mg/dL (ref 1.5–2.5)

## 2021-10-13 LAB — HEMOGLOBIN A1C
Hgb A1c MFr Bld: 5.4 % of total Hgb (ref <5.7)
Mean Plasma Glucose: 108 mg/dL
eAG (mmol/L): 6 mmol/L

## 2021-10-13 LAB — VITAMIN D 25 HYDROXY (VIT D DEFICIENCY, FRACTURES): Vit D, 25-Hydroxy: 30 ng/mL (ref 30–100)

## 2021-10-13 LAB — TSH: TSH: 1.5 mIU/L (ref 0.40–4.50)

## 2021-10-13 MED ORDER — CHOLECALCIFEROL 1.25 MG (50000 UT) PO CAPS: ORAL_CAPSULE | ORAL | 0 refills | Status: DC

## 2021-10-18 ENCOUNTER — Other Ambulatory Visit: Payer: Self-pay | Admitting: Nurse Practitioner

## 2021-10-18 DIAGNOSIS — E559 Vitamin D deficiency, unspecified: Secondary | ICD-10-CM

## 2021-10-18 MED ORDER — CHOLECALCIFEROL 1.25 MG (50000 UT) PO CAPS: ORAL_CAPSULE | ORAL | 0 refills | Status: DC

## 2021-10-27 DIAGNOSIS — Z1211 Encounter for screening for malignant neoplasm of colon: Secondary | ICD-10-CM | POA: Diagnosis not present

## 2021-11-05 LAB — COLOGUARD: COLOGUARD: NEGATIVE

## 2021-11-30 ENCOUNTER — Other Ambulatory Visit: Payer: Self-pay | Admitting: Adult Health

## 2021-11-30 DIAGNOSIS — F419 Anxiety disorder, unspecified: Secondary | ICD-10-CM

## 2021-11-30 MED ORDER — ALPRAZOLAM 1 MG PO TABS: ORAL_TABLET | ORAL | 0 refills | Status: DC

## 2022-01-27 ENCOUNTER — Other Ambulatory Visit: Payer: Self-pay | Admitting: Nurse Practitioner

## 2022-01-27 DIAGNOSIS — F419 Anxiety disorder, unspecified: Secondary | ICD-10-CM

## 2022-01-28 MED ORDER — ALPRAZOLAM 1 MG PO TABS: ORAL_TABLET | ORAL | 0 refills | Status: DC

## 2022-02-17 ENCOUNTER — Encounter: Payer: Self-pay | Admitting: Adult Health

## 2022-02-17 ENCOUNTER — Ambulatory Visit: Payer: BC Managed Care – PPO | Admitting: Adult Health

## 2022-02-17 VITALS — BP 148/80 | HR 82 | Temp 97.9°F | Wt 173.6 lb

## 2022-02-17 DIAGNOSIS — E559 Vitamin D deficiency, unspecified: Secondary | ICD-10-CM | POA: Diagnosis not present

## 2022-02-17 DIAGNOSIS — R0789 Other chest pain: Secondary | ICD-10-CM

## 2022-02-17 MED ORDER — MELOXICAM 15 MG PO TABS: ORAL_TABLET | ORAL | 1 refills | Status: DC

## 2022-02-17 NOTE — Progress Notes (Signed)
Assessment and Plan: ? ?Halleigh was seen today for acute visit. ? ?Diagnoses and all orders for this visit: ? ?Right-sided chest wall pain ?Elicited today with palpation, ROM - advised MSK etiology, mild muscle strain; not suggestive of pulm or cardiac etiology ?She is reassured by this ?Rest, alternate heat/ice, tx with NSAID, modify behaviors in the short term ?Follow up if persistent or new sx ?-     meloxicam (MOBIC) 15 MG tablet; Take one daily with food for 2 weeks, can take with tylenol, can not take with aleve, iburpofen, then as needed daily for pain ? ?Vitamin D deficiency ?Completed 50000 IU weekly x 12 weeks ?Start 2000 IU daily until next check  ? ?Discussed med's effects and SE's.   ?Over 20 minutes of exam, counseling, chart review, and critical decision making was performed.  ? ?Future Appointments  ?Date Time Provider [place]  ?04/13/2022  3:30 PM Mull, Townsend Roger, NP [facility] None  ?10/12/2022  3:00 PM Magda Bernheim, NP [facility] None  ? ? ?------------------------------------------------------------------------------------------------------------------ ? ? ?HPI ?BP (!) 148/80   Pulse 82   Temp 97.9 ?F (36.6 ?C)   Wt 173 lb 9.6 oz (78.7 kg)   SpO2 97%   BMI 28.02 kg/m?  ?61 y.o.female presents for 4 days of intermittent R sided chest discomfort with coughing. She reports has had mild rhinitis/cough intermittent for several weeks corresponding with allergy sx. Does improve with antihistamine.  ? ?She went Fishing over the weekend. For the last 4 days has noted intermittent brief R sided chest discomfort with coughing from post-nasal drip. Hasn't tried taking anything.  ? ?Denies persistent chest pain, dyspnea, palpitations, wheezing.  ? ?Past Medical History:  ?Diagnosis Date  ? Anemia   ? Asthma, mild intermittent, well-controlled   ? GERD (gastroesophageal reflux disease)   ? Hx of migraines   ? Hyperlipidemia   ? Vitamin D deficiency   ?  ? ?Allergies  ?Allergen Reactions  ? Biaxin  [Clarithromycin] Nausea Only  ? ? ?Current Outpatient Medications on File Prior to Visit  ?Medication Sig  ? ALPRAZolam (XANAX) 1 MG tablet Take 1/2 - 1 tablet  1 - 2 x /day ONLY if needed for Anxiety Attack &  limit to 5 days /week to avoid Addiction & Dementia  ? Cholecalciferol (VITAMIN D3) 50 MCG (2000 UT) CAPS Take 1 capsule by mouth daily.  ? ?No current facility-administered medications on file prior to visit.  ? ? ?ROS: all negative except above.  ? ?Physical Exam: ? ?BP (!) 148/80   Pulse 82   Temp 97.9 ?F (36.6 ?C)   Wt 173 lb 9.6 oz (78.7 kg)   SpO2 97%   BMI 28.02 kg/m?  ? ?General Appearance: Well nourished, in no apparent distress. ?Eyes: PERRLA,  conjunctiva no swelling or erythema ?Sinuses: No Frontal/maxillary tenderness ?ENT/Mouth: Ext aud canals clear, TMs without erythema, bulging. No erythema, swelling, or exudate on post pharynx.  Tonsils not swollen or erythematous. Hearing normal.  ?Neck: Supple, thyroid normal.  ?Respiratory: Respiratory effort normal, BS equal bilaterally without rales, rhonchi, wheezing or stridor.  ?Cardio: RRR with no MRGs. Brisk peripheral pulses without edema.  ?Lymphatics: Non tender without lymphadenopathy.  ?Musculoskeletal: R chest upper wall and supraclavicular neck muscle with tenderness, discomfort in chest wall with R shoulder ROM (range intact). No spasm.  ?Skin: Warm, dry without rashes, lesions, ecchymosis.  ?Neuro: Normal muscle tone, distal sensation  ?Psych: Awake and oriented X 3, normal affect, Insight and Judgment appropriate.  ?  ?  Izora Ribas, NP ?4:39 PM ?[facility] ? ?

## 2022-03-24 ENCOUNTER — Other Ambulatory Visit: Payer: Self-pay | Admitting: Nurse Practitioner

## 2022-03-24 DIAGNOSIS — F419 Anxiety disorder, unspecified: Secondary | ICD-10-CM

## 2022-03-24 MED ORDER — ALPRAZOLAM 1 MG PO TABS: ORAL_TABLET | ORAL | 0 refills | Status: DC

## 2022-04-12 NOTE — Progress Notes (Unsigned)
6 MONTH FOLLOW UP  Assessment and Plan:   Kairie was seen today for 6 month follow up Diagnoses and all orders for this visit:   Hyperlipidemia, unspecified hyperlipidemia type Discussed dietary and exercise modifications Has not made any dietary changes Discussed metamucil Lipid panel CMP TSH  Anemia, unspecified type Not taking supplements at this time -CBC  Vitamin D deficiency Completed Rx 50,000IU -     Vitamin D (25 hydroxy) Dose pending lab results  Vaping nicotine dependence, tobacco product Long discussion on the harms of vaping, strongly encouraged to quit Pt is not currently ready to stop, reevaluate next visit  Irritable bowel syndrome with constipation Doing well at this time Controlled by diet, monitoring Metamucil will also help with this.  Anxiety Doing well on current regiment Continue : alprazolam 1/2 to one tablet daily Discussed stress management techniques   Discussed good sleep hygiene Discussed increasing physical activity and exercise Increase water intake   Other abnormal glucose Discussed dietary and exercise modifications A1c  Overweight BMI 27 Discussed dietary and exercise modifications   Medication management continued      Continue diet and meds as discussed. Further disposition pending results of labs. Discussed med's effects and SE's.   Over 40 minutes of face to face exam, interview, counseling, chart review, and critical decision making was performed.   Future Appointments  Date Time Provider Department Center  04/13/2022  3:30 PM Raynelle Dick, NP [facility] None  10/12/2022  3:00 PM Adela Glimpse, NP [facility] None    ----------------------------------------------------------------------------------------------------------------------  HPI 61 y.o. female  presents for complete physical and follow up on anxiety, smoking, HLD and vitamin D deficiency.   Reports overall she is doing well.  No health or  medication concerns today.  She does have occasional aching pain in knees bilaterally. She states Tylenol and Advil do not help.  She has a diagnosis of anxiety and taking xanax 1 mg BID PRN, reports symptoms are well controlled on current regimen. She reports anxiety is much improved; typically takes 1 mg at night for sleep, and very rarely takes if she has a severely stressful day at work, or if traveling by air.   She does not smoke but continues to vape 3mg ; discussed risks associated with smoking, patient is not ready to quit.   BMI is There is no height or weight on file to calculate BMI., she has not been working on diet and exercise. She is currently not working out or watching her diet.  Wt Readings from Last 3 Encounters:  02/17/22 173 lb 9.6 oz (78.7 kg)  10/12/21 172 lb (78 kg)  10/12/20 151 lb (68.5 kg)   Today their BP is   BP Readings from Last 3 Encounters:  02/17/22 (!) 148/80  10/12/21 138/64  10/12/20 122/84    She denies chest pain, shortness of breath, dizziness.   She is not on cholesterol medication and denies myalgias. Her cholesterol is not at goal. The cholesterol last visit was:   Lab Results  Component Value Date   CHOL 227 10/01/2019   HDL 62 10/01/2019   LDLCALC 142 (H) 10/01/2019   TRIG 122 10/01/2019   CHOLHDL 3.7 10/01/2019    She has been working on diet and exercise for glucose management, and denies foot ulcerations, hyperglycemia, increased appetite, nausea, paresthesia of the feet, polydipsia, polyuria, visual disturbances, vomiting and weight loss. Last A1C in the office was:  Lab Results  Component Value Date   HGBA1C 5.2 10/01/2019  Patient has hx of vitamin D deficiency and completed Rx of 50,000iu three days a week for 12 weeks.  Not taking any supplement at this time. Lab Results  Component Value Date   VD25OH 16 10/01/2019        Current Medications:  Current Outpatient Medications on File Prior to Visit  Medication Sig    ALPRAZolam (XANAX) 1 MG tablet Take 1/2 - 1 tablet  1 - 2 x /day ONLY if needed for Anxiety Attack &  limit to 5 days /week to avoid Addiction & Dementia   Cholecalciferol (VITAMIN D3) 50 MCG (2000 UT) CAPS Take 1 capsule by mouth daily.   meloxicam (MOBIC) 15 MG tablet Take one daily with food for 2 weeks, can take with tylenol, can not take with aleve, iburpofen, then as needed daily for pain   No current facility-administered medications on file prior to visit.     Allergies:  Allergies  Allergen Reactions   Biaxin [Clarithromycin] Nausea Only     Medical History:  Past Medical History:  Diagnosis Date   Anemia    Asthma, mild intermittent, well-controlled    GERD (gastroesophageal reflux disease)    Hx of migraines    Hyperlipidemia    Vitamin D deficiency    Family history- Reviewed and unchanged Social history- Reviewed and unchanged    Names of Other Physician/Practitioners you currently use: 1. [facility] here for primary care 2.Eye Exam: 2022 3. Dental exam: 2022  Patient Care Team: Lucky CowboyMcKeown, William, MD as PCP - General (Internal Medicine) Hanley SeamenBernstorf, Dustin FolksSteven W, MD as Referring Physician (Optometry) Ilda MoriKaplan, Richard, MD as Consulting Physician (Obstetrics and Gynecology) Arminda ResidesJones, Daniel, MD as Consulting Physician (Dermatology)    Screening Tests: Immunization History  Administered Date(s) Administered   PPD Test 12/08/2014   Pneumococcal Polysaccharide-23 10/31/1996   Td 10/31/2009   Tdap 10/12/2020    Preventative care: Last colonoscopy: Declines, will do Cologuard Last mammogram: 2022 Last pap smear/pelvic exam:  LMP: 10 years ago.   Vaccinations: TD or Tdap: 09/2020, received today. Influenza: Declined Pneumococcal: 10/1996 Prevnar13: N/A Shingles/Zostavax: Discussed   Review of Systems:  Review of Systems  Constitutional:  Negative for chills, fever, malaise/fatigue and weight loss.  HENT:  Negative  for congestion, hearing loss, sinus pain, sore throat and tinnitus.   Eyes:  Negative for blurred vision and double vision.  Respiratory:  Negative for cough, hemoptysis, sputum production, shortness of breath and wheezing.   Cardiovascular:  Negative for chest pain, palpitations, orthopnea, claudication and leg swelling.  Gastrointestinal:  Positive for constipation. Negative for abdominal pain, blood in stool, diarrhea, heartburn, melena, nausea and vomiting.  Genitourinary: Negative.  Negative for dysuria and urgency.  Musculoskeletal:  Positive for joint pain (knees). Negative for back pain, falls, myalgias and neck pain.  Skin:  Negative for rash.  Neurological:  Negative for dizziness, tingling, tremors, sensory change, weakness and headaches.  Endo/Heme/Allergies:  Negative for polydipsia. Does not bruise/bleed easily.  Psychiatric/Behavioral:  Negative for depression and suicidal ideas. The patient is nervous/anxious. The patient does not have insomnia.   All other systems reviewed and are negative.     Physical Exam: There were no vitals taken for this visit. Wt Readings from Last 3 Encounters:  02/17/22 173 lb 9.6 oz (78.7 kg)  10/12/21 172 lb (78 kg)  10/12/20 151 lb (68.5 kg)   General Appearance: Well nourished, in no apparent distress. Eyes: PERRLA, EOMs, conjunctiva no swelling or erythema Sinuses: No Frontal/maxillary  tenderness ENT/Mouth: Ext aud canals clear, TMs without erythema, bulging. No erythema, swelling, or exudate on post pharynx.  Tonsils not swollen or erythematous. Hearing normal.  Neck: Supple, thyroid normal.  Respiratory: Respiratory effort normal, BS equal bilaterally without rales, rhonchi, wheezing or stridor.  Cardio: RRR with no MRGs. Brisk peripheral pulses without edema.  Abdomen: Soft, + BS.  Non tender, no guarding, rebound, hernias, masses. Lymphatics: Non tender without lymphadenopathy.  Musculoskeletal: Full ROM, 5/5 strength, Normal  gait Skin: Warm, dry without rashes, lesions, ecchymosis. She has some thickening of base of right 5th toe with mild tenderness to palpation on sole.  Neuro: Cranial nerves intact. No cerebellar symptoms.  Psych: Awake and oriented X 3, normal affect, Insight and Judgment appropriate.     EKG: defer per pt request  Raynelle Dick, NP 3:45 PM [facility]

## 2022-04-13 ENCOUNTER — Ambulatory Visit: Payer: BC Managed Care – PPO | Admitting: Nurse Practitioner

## 2022-04-13 ENCOUNTER — Encounter: Payer: Self-pay | Admitting: Nurse Practitioner

## 2022-04-13 VITALS — BP 141/90 | HR 77 | Temp 96.9°F | Wt 177.8 lb

## 2022-04-13 DIAGNOSIS — F17290 Nicotine dependence, other tobacco product, uncomplicated: Secondary | ICD-10-CM

## 2022-04-13 DIAGNOSIS — K581 Irritable bowel syndrome with constipation: Secondary | ICD-10-CM

## 2022-04-13 DIAGNOSIS — D649 Anemia, unspecified: Secondary | ICD-10-CM

## 2022-04-13 DIAGNOSIS — I10 Essential (primary) hypertension: Secondary | ICD-10-CM

## 2022-04-13 DIAGNOSIS — F419 Anxiety disorder, unspecified: Secondary | ICD-10-CM | POA: Diagnosis not present

## 2022-04-13 DIAGNOSIS — R7309 Other abnormal glucose: Secondary | ICD-10-CM

## 2022-04-13 DIAGNOSIS — E559 Vitamin D deficiency, unspecified: Secondary | ICD-10-CM

## 2022-04-13 DIAGNOSIS — Z6827 Body mass index (BMI) 27.0-27.9, adult: Secondary | ICD-10-CM

## 2022-04-13 DIAGNOSIS — Z79899 Other long term (current) drug therapy: Secondary | ICD-10-CM

## 2022-04-13 DIAGNOSIS — E785 Hyperlipidemia, unspecified: Secondary | ICD-10-CM

## 2022-04-13 DIAGNOSIS — E663 Overweight: Secondary | ICD-10-CM

## 2022-04-13 NOTE — Progress Notes (Incomplete)
Assessment and Plan:  Rebecca Ochoa was seen today for a ***.  Diagnoses and all order for this visit:  1. Hyperlipidemia, unspecified hyperlipidemia type ***  2. Anemia, unspecified type ***  3. Irritable bowel syndrome with constipation ***  4. Anxiety ***  5. Vitamin D deficiency ***  6. Vaping nicotine dependence, tobacco product ***  7. Overweight with body mass index (BMI) of 27 to 27.9 in adult ***  8. Abnormal glucose ***  9. Medication management ***    Continue to monitor for any increase in fever, chills, N/V, diarrhea, changes to bowel habits, blood in stool.  Notify office for further evaluation and treatment, questions or concerns if s/s fail to improve. The risks and benefits of my recommendations, as well as other treatment options were discussed with the patient today. Questions were answered.  Further disposition pending results of labs. Discussed med's effects and SE's.    Over *** minutes of exam, counseling, chart review, and critical decision making was performed.   Future Appointments  Date Time Provider [place]  10/12/2022  3:00 PM Darrol Jump, NP [facility] None    ------------------------------------------------------------------------------------------------------------------  BMI is Body mass index is 28.7 kg/m., she {HAS HAS [id number] been working on diet and exercise.  HPI BP (!) 141/90   Pulse 77   Temp (!) 96.9 F (36.1 C)   Wt 177 lb 12.8 oz (80.6 kg)   SpO2 98%   BMI 28.70 kg/m  61 y.o.female presents for  Past Medical History:  Diagnosis Date  . Anemia   . Asthma, mild intermittent, well-controlled   . GERD (gastroesophageal reflux disease)   . Hx of migraines   . Hyperlipidemia   . Vitamin D deficiency      Allergies  Allergen Reactions  . Biaxin [Clarithromycin] Nausea Only    Current Outpatient Medications on File Prior to Visit  Medication Sig  . ALPRAZolam (XANAX) 1 MG tablet Take 1/2  - 1 tablet  1 - 2 x /day ONLY if needed for Anxiety Attack &  limit to 5 days /week to avoid Addiction & Dementia  . Cholecalciferol (VITAMIN D3) 50 MCG (2000 UT) CAPS Take 1 capsule by mouth daily.  . meloxicam (MOBIC) 15 MG tablet Take one daily with food for 2 weeks, can take with tylenol, can not take with aleve, iburpofen, then as needed daily for pain (Patient not taking: Reported on 04/13/2022)   No current facility-administered medications on file prior to visit.    ROS: all negative except what is noted in the HPI.   ROS   Physical Exam:  BP (!) 141/90   Pulse 77   Temp (!) 96.9 F (36.1 C)   Wt 177 lb 12.8 oz (80.6 kg)   SpO2 98%   BMI 28.70 kg/m   General Appearance: NAD.  Awake, conversant and cooperative. Eyes: PERRLA, EOMs intact.  Sclera white.  Conjunctiva without erythema. Sinuses: No frontal/maxillary tenderness.  No nasal discharge. Nares patent.  ENT/Mouth: Ext aud canals clear.  Bilateral TMs w/DOL and without erythema or bulging. Hearing intact.  Posterior pharynx without swelling or exudate.  Tonsils without swelling or erythema.  Neck: Supple.  No masses, nodules or thyromegaly. Respiratory: Effort is regular with non-labored breathing. Breath sounds are equal bilaterally without rales, rhonchi, wheezing or stridor.  Cardio: RRR with no MRGs. Brisk peripheral pulses without edema.  Abdomen: Active BS in all four quadrants.  Soft and non-tender without guarding, rebound tenderness, hernias or masses. Lymphatics: Non tender  without lymphadenopathy.  Musculoskeletal: Full ROM, 5/5 strength, normal ambulation.  No clubbing or cyanosis. Skin: Appropriate color for ethnicity. Warm without rashes, lesions, ecchymosis, ulcers.  Neuro: CN II-XII grossly normal. Normal muscle tone without cerebellar symptoms and intact sensation.   Psych: AO X 3,  appropriate mood and affect, insight and judgment.     Darrol Jump, NP 3:39 PM [facility]

## 2022-04-14 NOTE — Progress Notes (Signed)
Assessment and Plan:  Rebecca Ochoa was seen today for a follow up.  Diagnoses and all order for this visit:  1. Hyperlipidemia, unspecified hyperlipidemia type Controlled Discussed lifestyle modifications. Recommended diet heavy in fruits and veggies, omega 3's. Decrease consumption of animal meats, cheeses, and dairy products. Remain active and exercise as tolerated. Continue to monitor.   2. Anemia, unspecified type Controlled. Discussed good sources of iron include green leafy vegetables and lean red meat.  3. Irritable bowel syndrome with constipation Controlled. Discussed stress reduction. Discussed well-balanced diet.  4. Anxiety Discussed starting to wean Xanax. Patient is in agreement. Discussed other medications and methods used to treat anxiety including Buspar, meditation, good sleep hygiene.   Continue to monitor.   5. Vitamin D deficiency Continue supplement.  6. Vaping nicotine dependence, tobacco product Discussed smoking cessation.   Patient not ready to quit. Continue to monitor.   7. Overweight with body mass index (BMI) of 27 to 27.9 in adult Continue lifestyle modifications. Focus on consuming a diet heavy in fruits and veggies, omega 3's. Decrease consumption of animal meats, cheeses, and dairy products. Remain active and incorporate daily tolerated exercise. Continue to monitor weight loss.   8. Abnormal glucose Controlled. Continue to monitor.   9. Medication management All medications discussed and reviewed in full. All questions and concerns regarding medications addressed.   10.  Elevated blood pressure without the diagnosis of hypertension.  Monitor BP In home. If consistently >130/90 contact office for further review, evaluation and treatment. Report to ER for any increase in stroke like symptoms, including HA, N/V, paralysis, difficulty speaking, trouble walking, confusion, vision changes, CP, heart palpitations, SOB,  diaphoresis.   Notify office for further evaluation and treatment, questions or concerns if s/s fail to improve. The risks and benefits of my recommendations, as well as other treatment options were discussed with the patient today. Questions were answered.  Further disposition pending results of labs. Discussed med's effects and SE's.    Over 20 minutes of exam, counseling, chart review, and critical decision making was performed.   Future Appointments  Date Time Provider Department Center  10/12/2022  3:00 PM Tae Robak, Archie Patten, NP [facility] None    ------------------------------------------------------------------------------------------------------------------   HPI BP (!) 141/90   Pulse 77   Temp (!) 96.9 F (36.1 C)   Wt 177 lb 12.8 oz (80.6 kg)   SpO2 98%   BMI 28.70 kg/m    61 y.o.female presents for a follow up.  Overall she reports feeling well.  She has recently started a new job and is no longer feeling as stressed or anxious.  She continues Xanax PM and during the day only as needed.    She has stopped smoking cigarettes and started vaping.  She is continuing to gain weight.  She feels this is due to being "happier" in her relationship and no longer working a stressful job.  She is also eating fast food with her husband.     BMI is Body mass index is 28.7 kg/m., she has not been working on diet and exercise. Wt Readings from Last 3 Encounters:  04/13/22 177 lb 12.8 oz (80.6 kg)  02/17/22 173 lb 9.6 oz (78.7 kg)  10/12/21 172 lb (78 kg)    BP is elevated in clinic.  She is not on BP medications.  She feels this is due to her recent eating habits.  BP Readings from Last 3 Encounters:  04/13/22 (!) 141/90  02/17/22 (!) 148/80  10/12/21 138/64  Cholesterol is not at goal.  Triglycerides are elevated.  She contributes this to dietary intake.  Lab Results  Component Value Date   CHOL 195 10/12/2021   HDL 51 10/12/2021   LDLCALC 100 (H) 10/12/2021   TRIG  327 (H) 10/12/2021   CHOLHDL 3.8 10/12/2021      Past Medical History:  Diagnosis Date   Anemia    Asthma, mild intermittent, well-controlled    GERD (gastroesophageal reflux disease)    Hx of migraines    Hyperlipidemia    Vitamin D deficiency      Allergies  Allergen Reactions   Biaxin [Clarithromycin] Nausea Only    Current Outpatient Medications on File Prior to Visit  Medication Sig   ALPRAZolam (XANAX) 1 MG tablet Take 1/2 - 1 tablet  1 - 2 x /day ONLY if needed for Anxiety Attack &  limit to 5 days /week to avoid Addiction & Dementia   Cholecalciferol (VITAMIN D3) 50 MCG (2000 UT) CAPS Take 1 capsule by mouth daily.   meloxicam (MOBIC) 15 MG tablet Take one daily with food for 2 weeks, can take with tylenol, can not take with aleve, iburpofen, then as needed daily for pain (Patient not taking: Reported on 04/13/2022)   No current facility-administered medications on file prior to visit.    ROS: all negative except what is noted in the HPI.     Physical Exam:  BP (!) 141/90   Pulse 77   Temp (!) 96.9 F (36.1 C)   Wt 177 lb 12.8 oz (80.6 kg)   SpO2 98%   BMI 28.70 kg/m   General Appearance: NAD.  Awake, conversant and cooperative. Eyes: PERRLA, EOMs intact.  Sclera white.  Conjunctiva without erythema. Sinuses: No frontal/maxillary tenderness.  No nasal discharge. Nares patent.  ENT/Mouth: Ext aud canals clear.  Bilateral TMs w/DOL and without erythema or bulging. Hearing intact.  Posterior pharynx without swelling or exudate.  Tonsils without swelling or erythema.  Neck: Supple.  No masses, nodules or thyromegaly. Respiratory: Effort is regular with non-labored breathing. Breath sounds are equal bilaterally without rales, rhonchi, wheezing or stridor.  Cardio: RRR with no MRGs. Brisk peripheral pulses without edema.  Abdomen: Active BS in all four quadrants.  Soft and non-tender without guarding, rebound tenderness, hernias or masses. Lymphatics: Non tender  without lymphadenopathy.  Musculoskeletal: Full ROM, 5/5 strength, normal ambulation.  No clubbing or cyanosis. Skin: Appropriate color for ethnicity. Warm without rashes, lesions, ecchymosis, ulcers.  Neuro: CN II-XII grossly normal. Normal muscle tone without cerebellar symptoms and intact sensation.   Psych: AO X 3,  appropriate mood and affect, insight and judgment.     Adela Glimpse, NP 1:56 PM [facility]

## 2022-05-04 DIAGNOSIS — H538 Other visual disturbances: Secondary | ICD-10-CM | POA: Diagnosis not present

## 2022-05-04 DIAGNOSIS — I10 Essential (primary) hypertension: Secondary | ICD-10-CM | POA: Diagnosis not present

## 2022-05-04 DIAGNOSIS — R002 Palpitations: Secondary | ICD-10-CM | POA: Diagnosis not present

## 2022-05-04 DIAGNOSIS — R202 Paresthesia of skin: Secondary | ICD-10-CM | POA: Diagnosis not present

## 2022-06-01 ENCOUNTER — Other Ambulatory Visit: Payer: Self-pay | Admitting: Nurse Practitioner

## 2022-06-01 ENCOUNTER — Encounter: Payer: Self-pay | Admitting: Nurse Practitioner

## 2022-06-01 DIAGNOSIS — F419 Anxiety disorder, unspecified: Secondary | ICD-10-CM

## 2022-06-04 NOTE — Progress Notes (Signed)
    Future Appointments  Date Time Provider Department  06/06/2022  9:30 AM [name], MD [facility]  10/12/2022  3:00 PM Adela Glimpse, NP [facility]    History of Present Illness:      Patient is a very nice 61 yo MWM presenting with concerns of  swelling in  Lt foot . She relates an episode occurring at work about a month ago & EMS was called & checked her out with Nl VS & 12 lead EKG & Rhythm strip (reviewed) .  Sounds as if she had an anxiety attack. She has done OK since then but has ongoing c/o bilat knee pains.  BP is noted elevated today. Denies CP - exertional or otherwise and no palpitations , dyspnea, postural dizziness or edema.   Medications    ALPRAZolam (XANAX) 1 MG tablet, Take 1/2 - 1 tablet  1 - 2 x /day ONLY if needed for Anxiety Attack &  limit to 5 days /week to avoid Addiction & Dementia   CVITAMIN D  2000 u, Take 1 capsule  daily.  Problem list She has Hyperlipidemia; Anemia; Vitamin D deficiency; Hx of migraines; Tobacco dependence; Noncompliance; Asthma; Other abnormal glucose; and Anxiety on their problem list.   Observations/Objective:  BP (!) 170/90   Pulse 85   Temp 97.9 F (36.6 C)   Resp 16   Ht 5' 5.5" (1.664 m)   Wt 176 lb 6.4 oz (80 kg)   SpO2 98%   BMI 28.91 kg/m   BP rechecked x 3   HEENT - WNL. Neck - supple.  Chest - Clear equal BS. Cor - Nl HS. RRR w/o sig MGR. PP 1(+). No edema.  PP 2+/2+. MS- FROM w/o deformities. No obvious synovitis or crepitus of knees.  Gait Nl. Neuro -  Nl w/o focal abnormalities.  Assessment and Plan:   1. Essential hypertension  - Recc monitor BPs 2 x /day  - bisoprolol-hydrochlorothiazide  5-6.25 MG tablet;  Take  1 tablet  Daily  for BP   Dispense: 90 tablet; Refill: 3  2. Osteoarthritis of both knees, unspecified osteoarthritis type  - meloxicam (MOBIC) 15 MG tablet;  Take 1/2 to 1 tablet  Daily  with Food  for Pain & Inflammation   Dispense: 90 tablet; Refill: 3   Follow Up  Instructions:        I discussed the assessment and treatment plan with the patient. The patient was provided an opportunity to ask questions and all were answered. The patient agreed with the plan and demonstrated an understanding of the instructions.       The patient was advised to call back or seek an in-person evaluation if the symptoms worsen or if the condition fails to improve as anticipated.    Marinus Maw, MD

## 2022-06-06 ENCOUNTER — Encounter: Payer: Self-pay | Admitting: Internal Medicine

## 2022-06-06 ENCOUNTER — Ambulatory Visit: Payer: BC Managed Care – PPO | Admitting: Internal Medicine

## 2022-06-06 VITALS — BP 170/90 | HR 85 | Temp 97.9°F | Resp 16 | Ht 65.5 in | Wt 176.4 lb

## 2022-06-06 DIAGNOSIS — I10 Essential (primary) hypertension: Secondary | ICD-10-CM | POA: Diagnosis not present

## 2022-06-06 DIAGNOSIS — M170 Bilateral primary osteoarthritis of knee: Secondary | ICD-10-CM

## 2022-06-06 MED ORDER — BISOPROLOL-HYDROCHLOROTHIAZIDE 5-6.25 MG PO TABS: ORAL_TABLET | ORAL | 3 refills | Status: DC

## 2022-06-06 MED ORDER — ALPRAZOLAM 0.5 MG PO TABS
ORAL_TABLET | ORAL | 0 refills | Status: DC
Start: 2022-06-06 — End: 2022-07-11

## 2022-06-06 MED ORDER — MELOXICAM 15 MG PO TABS: ORAL_TABLET | ORAL | 3 refills | Status: DC

## 2022-06-06 NOTE — Patient Instructions (Signed)
Osteoarthritis  Osteoarthritis is a type of arthritis. It refers to joint pain or joint disease. Osteoarthritis affects tissue that covers the ends of bones in joints (cartilage). Cartilage acts as a cushion between the bones and helps them move smoothly. Osteoarthritis occurs when cartilage in the joints gets worn down. Osteoarthritis is sometimes called "wear and tear" arthritis. Osteoarthritis is the most common form of arthritis. It often occurs in older people. It is a condition that gets worse over time. The joints most often affected by this condition are in the fingers, toes, hips, knees, and spine, including the neck and lower back. What are the causes? This condition is caused by the wearing down of cartilage that covers the ends of bones. What increases the risk? The following factors may make you more likely to develop this condition: Being age 50 or older. Obesity. Overuse of joints. Past injury of a joint. Past surgery on a joint. Family history of osteoarthritis. What are the signs or symptoms? The main symptoms of this condition are pain, swelling, and stiffness in the joint. Other symptoms may include: An enlarged joint. More pain and further damage caused by small pieces of bone or cartilage that break off and float inside of the joint. Small deposits of bone (osteophytes) that grow on the edges of the joint. A grating or scraping feeling inside the joint when you move it. Popping or creaking sounds when you move. Difficulty walking or exercising. An inability to grip items, twist your hand(s), or control the movements of your hands and fingers. How is this diagnosed? This condition may be diagnosed based on: Your medical history. A physical exam. Your symptoms. X-rays of the affected joint(s). Blood tests to rule out other types of arthritis. How is this treated? There is no cure for this condition, but treatment can help control pain and improve joint function.  Treatment may include a combination of therapies, such as: Pain relief techniques, such as: Applying heat and cold to the joint. Massage. A form of talk therapy called cognitive behavioral therapy (CBT). This therapy helps you set goals and follow up on the changes that you make. Medicines for pain and inflammation. The medicines can be taken by mouth or applied to the skin. They include: NSAIDs, such as ibuprofen. Prescription medicines. Strong anti-inflammatory medicines (corticosteroids). Certain nutritional supplements. A prescribed exercise program. You may work with a physical therapist. Assistive devices, such as a brace, wrap, splint, specialized glove, or cane. A weight control plan. Surgery, such as: An osteotomy. This is done to reposition the bones and relieve pain or to remove loose pieces of bone and cartilage. Joint replacement surgery. You may need this surgery if you have advanced osteoarthritis. Follow these instructions at home: Activity Rest your affected joints as told by your health care provider. Exercise as told by your health care provider. He or she may recommend specific types of exercise, such as: Strengthening exercises. These are done to strengthen the muscles that support joints affected by arthritis. Aerobic activities. These are exercises, such as brisk walking or water aerobics, that increase your heart rate. Range-of-motion activities. These help your joints move more easily. Balance and agility exercises. Managing pain, stiffness, and swelling     If directed, apply heat to the affected area as often as told by your health care provider. Use the heat source that your health care provider recommends, such as a moist heat pack or a heating pad. If you have a removable assistive device, remove it   as told by your health care provider. Place a towel between your skin and the heat source. If your health care provider tells you to keep the assistive device  on while you apply heat, place a towel between the assistive device and the heat source. Leave the heat on for 20-30 minutes. Remove the heat if your skin turns bright red. This is especially important if you are unable to feel pain, heat, or cold. You may have a greater risk of getting burned. If directed, put ice on the affected area. To do this: If you have a removable assistive device, remove it as told by your health care provider. Put ice in a plastic bag. Place a towel between your skin and the bag. If your health care provider tells you to keep the assistive device on during icing, place a towel between the assistive device and the bag. Leave the ice on for 20 minutes, 2-3 times a day. Move your fingers or toes often to reduce stiffness and swelling. Raise (elevate) the injured area above the level of your heart while you are sitting or lying down. General instructions Take over-the-counter and prescription medicines only as told by your health care provider. Maintain a healthy weight. Follow instructions from your health care provider for weight control. Do not use any products that contain nicotine or tobacco, such as cigarettes, e-cigarettes, and chewing tobacco. If you need help quitting, ask your health care provider. Use assistive devices as told by your health care provider. Keep all follow-up visits as told by your health care provider. This is important. Where to find more information National Institute of Arthritis and Musculoskeletal and Skin Diseases: www.niams.nih.gov National Institute on Aging: www.nia.nih.gov American College of Rheumatology: www.rheumatology.org Contact a health care provider if: You have redness, swelling, or a feeling of warmth in a joint that gets worse. You have a fever along with joint or muscle aches. You develop a rash. You have trouble doing your normal activities. Get help right away if: You have pain that gets worse and is not relieved by  pain medicine. Summary Osteoarthritis is a type of arthritis that affects tissue covering the ends of bones in joints (cartilage). This condition is caused by the wearing down of cartilage that covers the ends of bones. The main symptom of this condition is pain, swelling, and stiffness in the joint. There is no cure for this condition, but treatment can help control pain and improve joint function. This information is not intended to replace advice given to you by your health care provider. Make sure you discuss any questions you have with your health care provider. Document Revised: 10/14/2019 Document Reviewed: 10/14/2019 Elsevier Patient Education  2023 [employer] Inc.  

## 2022-07-06 NOTE — Progress Notes (Deleted)
Assessment and Plan:  There are no diagnoses linked to this encounter.    Further disposition pending results of labs. Discussed med's effects and SE's.   Over 30 minutes of exam, counseling, chart review, and critical decision making was performed.   Future Appointments  Date Time Provider Department Center  07/07/2022 10:45 AM Raynelle Dick, NP [facility] None  10/12/2022  3:00 PM Adela Glimpse, NP [facility] None    ------------------------------------------------------------------------------------------------------------------   HPI There were no vitals taken for this visit. 61 y.o.female presents for  Past Medical History:  Diagnosis Date   Anemia    Asthma, mild intermittent, well-controlled    GERD (gastroesophageal reflux disease)    Hx of migraines    Hyperlipidemia    Vitamin D deficiency      Allergies  Allergen Reactions   Biaxin [Clarithromycin] Nausea Only    Current Outpatient Medications on File Prior to Visit  Medication Sig   ALPRAZolam (XANAX) 0.5 MG tablet Take  1/2 to 1 tablet  at bedtime   ONLY  if needed for  Sleep & please try to limit to 5 days /week to avoid Addiction & Dementia   bisoprolol-hydrochlorothiazide (ZIAC) 5-6.25 MG tablet Take  1 tablet  Daily  for BP   Cholecalciferol (VITAMIN D3) 50 MCG (2000 UT) CAPS Take 1 capsule by mouth daily.   meloxicam (MOBIC) 15 MG tablet Take 1/2 to 1 tablet  Daily  with Food  for Pain & Inflammation   No current facility-administered medications on file prior to visit.    ROS: all negative except above.   Physical Exam:  There were no vitals taken for this visit.  General Appearance: Well nourished, in no apparent distress. Eyes: PERRLA, EOMs, conjunctiva no swelling or erythema Sinuses: No Frontal/maxillary tenderness ENT/Mouth: Ext aud canals clear, TMs without erythema, bulging. No erythema, swelling, or exudate on post pharynx.  Tonsils not swollen or erythematous. Hearing normal.   Neck: Supple, thyroid normal.  Respiratory: Respiratory effort normal, BS equal bilaterally without rales, rhonchi, wheezing or stridor.  Cardio: RRR with no MRGs. Brisk peripheral pulses without edema.  Abdomen: Soft, + BS.  Non tender, no guarding, rebound, hernias, masses. Lymphatics: Non tender without lymphadenopathy.  Musculoskeletal: Full ROM, 5/5 strength, normal gait.  Skin: Warm, dry without rashes, lesions, ecchymosis.  Neuro: Cranial nerves intact. Normal muscle tone, no cerebellar symptoms. Sensation intact.  Psych: Awake and oriented X 3, normal affect, Insight and Judgment appropriate.     Raynelle Dick, NP 8:53 AM [facility]

## 2022-07-07 ENCOUNTER — Ambulatory Visit: Payer: Self-pay | Admitting: Nurse Practitioner

## 2022-07-08 NOTE — Progress Notes (Unsigned)
Assessment and Plan:  There are no diagnoses linked to this encounter.    Further disposition pending results of labs. Discussed med's effects and SE's.   Over 30 minutes of exam, counseling, chart review, and critical decision making was performed.   Future Appointments  Date Time Provider Department Center  07/11/2022 11:15 AM Raynelle Dick, NP [facility] None  10/12/2022  3:00 PM Adela Glimpse, NP [facility] None    ------------------------------------------------------------------------------------------------------------------   HPI There were no vitals taken for this visit. 61 y.o.female presents for  Past Medical History:  Diagnosis Date   Anemia    Asthma, mild intermittent, well-controlled    GERD (gastroesophageal reflux disease)    Hx of migraines    Hyperlipidemia    Vitamin D deficiency      Allergies  Allergen Reactions   Biaxin [Clarithromycin] Nausea Only    Current Outpatient Medications on File Prior to Visit  Medication Sig   ALPRAZolam (XANAX) 0.5 MG tablet Take  1/2 to 1 tablet  at bedtime   ONLY  if needed for  Sleep & please try to limit to 5 days /week to avoid Addiction & Dementia   bisoprolol-hydrochlorothiazide (ZIAC) 5-6.25 MG tablet Take  1 tablet  Daily  for BP   Cholecalciferol (VITAMIN D3) 50 MCG (2000 UT) CAPS Take 1 capsule by mouth daily.   meloxicam (MOBIC) 15 MG tablet Take 1/2 to 1 tablet  Daily  with Food  for Pain & Inflammation   No current facility-administered medications on file prior to visit.    ROS: all negative except above.   Physical Exam:  There were no vitals taken for this visit.  General Appearance: Well nourished, in no apparent distress. Eyes: PERRLA, EOMs, conjunctiva no swelling or erythema Sinuses: No Frontal/maxillary tenderness ENT/Mouth: Ext aud canals clear, TMs without erythema, bulging. No erythema, swelling, or exudate on post pharynx.  Tonsils not swollen or erythematous. Hearing normal.   Neck: Supple, thyroid normal.  Respiratory: Respiratory effort normal, BS equal bilaterally without rales, rhonchi, wheezing or stridor.  Cardio: RRR with no MRGs. Brisk peripheral pulses without edema.  Abdomen: Soft, + BS.  Non tender, no guarding, rebound, hernias, masses. Lymphatics: Non tender without lymphadenopathy.  Musculoskeletal: Full ROM, 5/5 strength, normal gait.  Skin: Warm, dry without rashes, lesions, ecchymosis.  Neuro: Cranial nerves intact. Normal muscle tone, no cerebellar symptoms. Sensation intact.  Psych: Awake and oriented X 3, normal affect, Insight and Judgment appropriate.     Raynelle Dick, NP 10:01 AM [facility]

## 2022-07-11 ENCOUNTER — Encounter: Payer: Self-pay | Admitting: Nurse Practitioner

## 2022-07-11 ENCOUNTER — Ambulatory Visit: Payer: BC Managed Care – PPO | Admitting: Nurse Practitioner

## 2022-07-11 VITALS — BP 150/68 | HR 67 | Temp 97.9°F | Ht 65.5 in | Wt 179.8 lb

## 2022-07-11 DIAGNOSIS — F419 Anxiety disorder, unspecified: Secondary | ICD-10-CM | POA: Diagnosis not present

## 2022-07-11 DIAGNOSIS — M170 Bilateral primary osteoarthritis of knee: Secondary | ICD-10-CM

## 2022-07-11 DIAGNOSIS — I10 Essential (primary) hypertension: Secondary | ICD-10-CM | POA: Diagnosis not present

## 2022-07-11 MED ORDER — LOSARTAN POTASSIUM-HCTZ 50-12.5 MG PO TABS: 1.0000 | ORAL_TABLET | Freq: Every day | ORAL | 11 refills | Status: DC

## 2022-07-11 MED ORDER — ALPRAZOLAM 0.5 MG PO TABS: ORAL_TABLET | ORAL | 0 refills | Status: DC

## 2022-07-11 NOTE — Patient Instructions (Addendum)
Stop Ziac and Start Hyzaar 50/12.5 mg Check BP at least 3 days a week, if BP running consistently greater than 130/80 notify the office Begin taking Meloxicam every other day to determine if this controls her pain with her knees

## 2022-08-08 NOTE — Progress Notes (Deleted)
Assessment and Plan:  There are no diagnoses linked to this encounter.    Further disposition pending results of labs. Discussed med's effects and SE's.   Over 30 minutes of exam, counseling, chart review, and critical decision making was performed.   Future Appointments  Date Time Provider [place]  08/09/2022 10:30 AM Alycia Rossetti, NP [facility] None  10/12/2022  3:00 PM Darrol Jump, NP [facility] None    ------------------------------------------------------------------------------------------------------------------   HPI There were no vitals taken for this visit. 61 y.o.female presents for  Past Medical History:  Diagnosis Date   Anemia    Asthma, mild intermittent, well-controlled    GERD (gastroesophageal reflux disease)    Hx of migraines    Hyperlipidemia    Vitamin D deficiency      Allergies  Allergen Reactions   Biaxin [Clarithromycin] Nausea Only    Current Outpatient Medications on File Prior to Visit  Medication Sig   ALPRAZolam (XANAX) 0.5 MG tablet Take  1/2 to 1 tablet  at bedtime   ONLY  if needed for  Sleep & please try to limit to 5 days /week to avoid Addiction & Dementia   Cholecalciferol (VITAMIN D3) 50 MCG (2000 UT) CAPS Take 1 capsule by mouth daily.   losartan-hydrochlorothiazide (HYZAAR) 50-12.5 MG tablet Take 1 tablet by mouth daily.   meloxicam (MOBIC) 15 MG tablet Take 1/2 to 1 tablet  Daily  with Food  for Pain & Inflammation   [DISCONTINUED] bisoprolol-hydrochlorothiazide (ZIAC) 5-6.25 MG tablet Take  1 tablet  Daily  for BP   No current facility-administered medications on file prior to visit.    ROS: all negative except above.   Physical Exam:  There were no vitals taken for this visit.  General Appearance: Well nourished, in no apparent distress. Eyes: PERRLA, EOMs, conjunctiva no swelling or erythema Sinuses: No Frontal/maxillary tenderness ENT/Mouth: Ext aud canals clear, TMs without erythema, bulging. No  erythema, swelling, or exudate on post pharynx.  Tonsils not swollen or erythematous. Hearing normal.  Neck: Supple, thyroid normal.  Respiratory: Respiratory effort normal, BS equal bilaterally without rales, rhonchi, wheezing or stridor.  Cardio: RRR with no MRGs. Brisk peripheral pulses without edema.  Abdomen: Soft, + BS.  Non tender, no guarding, rebound, hernias, masses. Lymphatics: Non tender without lymphadenopathy.  Musculoskeletal: Full ROM, 5/5 strength, normal gait.  Skin: Warm, dry without rashes, lesions, ecchymosis.  Neuro: Cranial nerves intact. Normal muscle tone, no cerebellar symptoms. Sensation intact.  Psych: Awake and oriented X 3, normal affect, Insight and Judgment appropriate.     Alycia Rossetti, NP 12:35 PM [facility]

## 2022-08-09 ENCOUNTER — Ambulatory Visit: Payer: Self-pay | Admitting: Nurse Practitioner

## 2022-08-18 NOTE — Progress Notes (Signed)
Assessment and Plan:  Rebecca Ochoa was seen today for follow-up.  Diagnoses and all orders for this visit:  Essential hypertension - continue medications, DASH diet, exercise and monitor at home. Call if greater than 130/80.  Go to the ER if any chest pain, shortness of breath, nausea, dizziness, severe HA, changes vision/speech   Anxiety Continue to use Alprazolam sparingly, try to limit to 5 days a week or less -     ALPRAZolam (XANAX) 0.5 MG tablet; Take  1/2 to 1 tablet  at bedtime   ONLY  if needed for  Sleep & please try to limit to 5 days /week to avoid Addiction & Dementia       Further disposition pending results of labs. Discussed med's effects and SE's.   Over 30 minutes of exam, counseling, chart review, and critical decision making was performed.   Future Appointments  Date Time Provider Department Center  10/12/2022  3:00 PM [place], Archie Patten, NP [facility] None    ------------------------------------------------------------------------------------------------------------------   HPI BP 130/78   Pulse 85   Temp (!) 97.2 F (36.2 C)   Ht 5' 5.5" (1.664 m)   Wt 183 lb 6.4 oz (83.2 kg)   SpO2 96%   BMI 30.06 kg/m   61 y.o.female presents for reevaluation of blood pressure. BP x 2 at home with results of 127/81 and 141/83. At last visit Rebecca Ochoa was d/c'd and was started on Hyzaar 50/12.5 mg daily. She will have an occasional headache which is relieved with Tylenol. Denies chest pain, dizziness and shortness of breath.  She continues to have swelling in feet at times.  BP Readings from Last 3 Encounters:  08/22/22 130/78  07/11/22 (!) 150/68  06/06/22 (!) 170/90   BMI is Body mass index is 30.06 kg/m., she has not been working on diet and exercise. Wt Readings from Last 3 Encounters:  08/22/22 183 lb 6.4 oz (83.2 kg)  07/11/22 179 lb 12.8 oz (81.6 kg)  06/06/22 176 lb 6.4 oz (80 kg)    She continues to use Xanax sparingly for anxiety.  Tried to limit to 5 days a  week   Past Medical History:  Diagnosis Date   Anemia    Asthma, mild intermittent, well-controlled    GERD (gastroesophageal reflux disease)    Hx of migraines    Hyperlipidemia    Vitamin D deficiency      Allergies  Allergen Reactions   Biaxin [Clarithromycin] Nausea Only    Current Outpatient Medications on File Prior to Visit  Medication Sig   ALPRAZolam (XANAX) 0.5 MG tablet Take  1/2 to 1 tablet  at bedtime   ONLY  if needed for  Sleep & please try to limit to 5 days /week to avoid Addiction & Dementia   losartan-hydrochlorothiazide (HYZAAR) 50-12.5 MG tablet Take 1 tablet by mouth daily.   meloxicam (MOBIC) 15 MG tablet Take 1/2 to 1 tablet  Daily  with Food  for Pain & Inflammation   Cholecalciferol (VITAMIN D3) 50 MCG (2000 UT) CAPS Take 1 capsule by mouth daily. (Patient not taking: Reported on 08/22/2022)   [DISCONTINUED] bisoprolol-hydrochlorothiazide (Rebecca Ochoa) 5-6.25 MG tablet Take  1 tablet  Daily  for BP   No current facility-administered medications on file prior to visit.    ROS: all negative except above.   Physical Exam:  BP 130/78   Pulse 85   Temp (!) 97.2 F (36.2 C)   Ht 5' 5.5" (1.664 m)   Wt 183 lb 6.4 oz (  83.2 kg)   SpO2 96%   BMI 30.06 kg/m   General Appearance: Well nourished, in no apparent distress. Eyes: PERRLA, EOMs, conjunctiva no swelling or erythema Sinuses: No Frontal/maxillary tenderness ENT/Mouth: Ext aud canals clear, TMs without erythema, bulging. No erythema, swelling, or exudate on post pharynx.  Tonsils not swollen or erythematous. Hearing normal.  Neck: Supple, thyroid normal.  Respiratory: Respiratory effort normal, BS equal bilaterally without rales, rhonchi, wheezing or stridor.  Cardio: RRR with no MRGs. Brisk peripheral pulses without edema.  Abdomen: Soft, + BS.  Non tender, no guarding, rebound, hernias, masses. Lymphatics: Non tender without lymphadenopathy.  Musculoskeletal: Full ROM, 5/5 strength, normal gait.   Skin: Warm, dry without rashes, lesions, ecchymosis.  Neuro: Cranial nerves intact. Normal muscle tone, no cerebellar symptoms. Sensation intact.  Psych: Awake and oriented X 3, normal affect, Insight and Judgment appropriate.     Alycia Rossetti, NP 11:29 AM [facility]

## 2022-08-22 ENCOUNTER — Ambulatory Visit: Payer: BC Managed Care – PPO | Admitting: Nurse Practitioner

## 2022-08-22 ENCOUNTER — Encounter: Payer: Self-pay | Admitting: Nurse Practitioner

## 2022-08-22 VITALS — BP 130/78 | HR 85 | Temp 97.2°F | Ht 65.5 in | Wt 183.4 lb

## 2022-08-22 DIAGNOSIS — F419 Anxiety disorder, unspecified: Secondary | ICD-10-CM

## 2022-08-22 DIAGNOSIS — I10 Essential (primary) hypertension: Secondary | ICD-10-CM

## 2022-08-22 MED ORDER — ALPRAZOLAM 0.5 MG PO TABS: ORAL_TABLET | ORAL | 0 refills | Status: DC

## 2022-09-21 ENCOUNTER — Other Ambulatory Visit: Payer: Self-pay | Admitting: Nurse Practitioner

## 2022-09-21 DIAGNOSIS — F419 Anxiety disorder, unspecified: Secondary | ICD-10-CM

## 2022-09-21 MED ORDER — ALPRAZOLAM 0.5 MG PO TABS: ORAL_TABLET | ORAL | 0 refills | Status: DC

## 2022-10-10 NOTE — Progress Notes (Unsigned)
COMPLETE PHYSICAL  Assessment and Plan:   Rebecca Ochoa was seen today for complete physical Diagnoses and all orders for this visit:  Encounter for routine adult medical exam with abnormal findings Yearly Cologuard Negative 09/2021, UTD due 2025 Will call to schedule mammogram -     CBC with Differential/Platelet -     COMPLETE METABOLIC PANEL WITH GFR  Essential Hypertension - continue medications, DASH diet, exercise and monitor at home. Call if greater than 130/80.   Hyperlipidemia, unspecified hyperlipidemia type Discussed dietary and exercise modifications Has not made any dietary changes Discussed metamucil Lipid panel CMP TSH  Anemia, unspecified type Not taking supplements at this time -CBC  Vitamin D deficiency Completed Rx 50,000IU -     Vitamin D (25 hydroxy) Dose pending lab results  Vaping nicotine dependence, tobacco product Long discussion on the harms of vaping, strongly encouraged to quit Pt is not currently ready to stop, reevaluate next visit  Irritable bowel syndrome with constipation Doing well at this time Controlled by diet, monitoring Metamucil will also help with this.  Anxiety Doing well on current regiment Continue : alprazolam 1/2 to one tablet daily Discussed stress management techniques   Discussed good sleep hygiene Discussed increasing physical activity and exercise Increase water intake  Hx of migraines Doing well at this time, no medications, OTC if needed No recent flares  Other abnormal glucose Discussed dietary and exercise modifications A1c   Obesity, BMI 30 Long discussion about weight loss, diet, and exercise Recommended diet heavy in fruits and veggies and low in animal meats, cheeses, and dairy products, appropriate calorie intake Patient will work on decreasing saturated fats, simple carbs and increasing activity Follow up at next visit   Medication management - Magnesium   Screening for blood or protein in  urine -     Routine urine with reflex microscopic - Microalbumin/creatinine urine ratio  Screening for ischemic heart disease - EKG  Abnormality of EKG/Dizziness PAC's, ST abnormality, possible T wave inversion .-Refer to cardiology urgent - Go to the ER if any chest pain, shortness of breath, nausea, dizziness, severe HA, changes vision/speech   Screening for thyroid disorder - TSH  Screening for AAA - U/S ABD Retroperitoneal LTD   Continue diet and meds as discussed. Further disposition pending results of labs. Discussed med's effects and SE's.   Over 40 minutes of face to face exam, interview, counseling, chart review, and critical decision making was performed.   Future Appointments  Date Time Provider Department Center  10/16/2023  2:00 PM Raynelle Dick, NP [facility] None    ----------------------------------------------------------------------------------------------------------------------  HPI 61 y.o. female  presents for complete physical and follow up on anxiety, smoking, HLD and vitamin D deficiency.   Reports overall she is doing well.  No health or medication concerns today.  She does have occasional aching pain in knees bilaterally. She states Tylenol and Advil do not help.  She has a diagnosis of anxiety and taking xanax 0.5 mg  PRN, reports symptoms are well controlled on current regimen. She reports anxiety is much improved; typically takes 0.5 mg at night for sleep, and very rarely takes if she has a severely stressful day at work, or if traveling by air. Alprazolam 0.5 mg #30 last filled 09/27/22  She does not smoke but continues to vape 3mg ; discussed risks associated with smoking, patient is not ready to quit.   BMI is Body mass index is 30.96 kg/m., she has not been working on diet and exercise. She  sits for her job and is not watching her diet Wt Readings from Last 3 Encounters:  10/12/22 183 lb 3.2 oz (83.1 kg)  08/22/22 183 lb 6.4 oz (83.2 kg)   07/11/22 179 lb 12.8 oz (81.6 kg)   Today their BP is BP: 138/74. Currently controlled on Hyzaar 50/12.5 mg daily. BP Readings from Last 3 Encounters:  10/12/22 138/74  08/22/22 130/78  07/11/22 (!) 150/68   She denies chest pain, shortness of breath, dizziness.    She is not on cholesterol medication and denies myalgias. Her cholesterol is not at goal. The cholesterol last visit was:   Lab Results  Component Value Date   CHOL 195 10/12/2021   HDL 51 10/12/2021   LDLCALC 100 (H) 10/12/2021   TRIG 327 (H) 10/12/2021   CHOLHDL 3.8 10/12/2021     She has been working on diet and exercise for abnormal glucose, and denies foot ulcerations, hyperglycemia, increased appetite, nausea, paresthesia of the feet, polydipsia, polyuria, visual disturbances, vomiting and weight loss. Last A1C in the office was:  Lab Results  Component Value Date   HGBA1C 5.2 10/01/2019   Patient has hx of vitamin D deficiency and completed Rx of 50,000iu three days a week for 12 weeks. If she remembers to take Vitamin d3 it is 2000 units daily Lab Results  Component Value Date   VD25OH 16 10/01/2019        Current Medications:  Current Outpatient Medications on File Prior to Visit  Medication Sig   ALPRAZolam (XANAX) 0.5 MG tablet Take  1/2 to 1 tablet  at bedtime   ONLY  if needed for  Sleep & please try to limit to 5 days /week to avoid Addiction & Dementia   losartan-hydrochlorothiazide (HYZAAR) 50-12.5 MG tablet Take 1 tablet by mouth daily.   meloxicam (MOBIC) 15 MG tablet Take 1/2 to 1 tablet  Daily  with Food  for Pain & Inflammation   Cholecalciferol (VITAMIN D3) 50 MCG (2000 UT) CAPS Take 1 capsule by mouth daily. (Patient not taking: Reported on 08/22/2022)   [DISCONTINUED] bisoprolol-hydrochlorothiazide (ZIAC) 5-6.25 MG tablet Take  1 tablet  Daily  for BP   No current facility-administered medications on file prior to visit.     Allergies:  Allergies  Allergen Reactions   Biaxin  [Clarithromycin] Nausea Only     Medical History:  Past Medical History:  Diagnosis Date   Anemia    Asthma, mild intermittent, well-controlled    GERD (gastroesophageal reflux disease)    Hx of migraines    Hyperlipidemia    Vitamin D deficiency    Family history- Reviewed and unchanged Social history- Reviewed and unchanged    Names of Other Physician/Practitioners you currently use: 1. [facility] here for primary care 2.Eye Exam: 2022 3. Dental exam: 2022  Patient Care Team: [name], MD as PCP - General (Internal Medicine) Hanley Seamen Dustin Folks, MD as Referring Physician (Optometry) Ilda Mori, MD as Consulting Physician (Obstetrics and Gynecology) Arminda Resides, MD as Consulting Physician (Dermatology)    Screening Tests: Immunization History  Administered Date(s) Administered   PPD Test 12/08/2014   Pneumococcal Polysaccharide-23 10/31/1996   Td 10/31/2009   Tdap 10/12/2020    Preventative care: Last colonoscopy: Cologuard negative 2022 Last mammogram: 2022 Last pap smear/pelvic exam:  LMP: 10 years ago.   Vaccinations: TD or Tdap: 09/2020, received today. Influenza: Declined Pneumococcal: 10/1996 Prevnar13: N/A Shingles/Zostavax: Discussed   Review of Systems:  Review of Systems  Constitutional:  Negative for chills, fever, malaise/fatigue and weight loss.  HENT:  Negative for congestion, hearing loss, sinus pain, sore throat and tinnitus.   Eyes:  Negative for blurred vision and double vision.  Respiratory:  Negative for cough, hemoptysis, sputum production, shortness of breath and wheezing.   Cardiovascular:  Negative for chest pain, palpitations, orthopnea, claudication and leg swelling.  Gastrointestinal:  Positive for constipation. Negative for abdominal pain, blood in stool, diarrhea, heartburn, melena, nausea and vomiting.  Genitourinary: Negative.  Negative for dysuria and urgency.   Musculoskeletal:  Positive for joint pain (knees). Negative for back pain, falls, myalgias and neck pain.  Skin:  Negative for rash.  Neurological:  Negative for dizziness, tingling, tremors, sensory change, weakness and headaches.  Endo/Heme/Allergies:  Negative for polydipsia. Does not bruise/bleed easily.  Psychiatric/Behavioral:  Negative for depression and suicidal ideas. The patient is nervous/anxious. The patient does not have insomnia.   All other systems reviewed and are negative.     Physical Exam: BP 138/74   Pulse 83   Temp (!) 97.5 F (36.4 C)   Ht 5' 4.5" (1.638 m)   Wt 183 lb 3.2 oz (83.1 kg)   SpO2 98%   BMI 30.96 kg/m  Wt Readings from Last 3 Encounters:  10/12/22 183 lb 3.2 oz (83.1 kg)  08/22/22 183 lb 6.4 oz (83.2 kg)  07/11/22 179 lb 12.8 oz (81.6 kg)   General Appearance: Well nourished, in no apparent distress. Eyes: PERRLA, EOMs, conjunctiva no swelling or erythema Sinuses: No Frontal/maxillary tenderness ENT/Mouth: Ext aud canals clear, TMs without erythema, bulging. No erythema, swelling, or exudate on post pharynx.  Tonsils not swollen or erythematous. Hearing normal.  Neck: Supple, thyroid normal.  Respiratory: Respiratory effort normal, BS equal bilaterally without rales, rhonchi, wheezing or stridor.  Cardio: RRR with no MRGs. Brisk peripheral pulses without edema.  Abdomen: Soft, + BS.  Non tender, no guarding, rebound, hernias, masses. Lymphatics: Non tender without lymphadenopathy.  Musculoskeletal: Full ROM, 5/5 strength, Normal gait Skin: Warm, dry without rashes, lesions, ecchymosis. .  Neuro: Cranial nerves intact. No cerebellar symptoms. Pt experienced dizziness when lying flat for EKG- has resolved Psych: Awake and oriented X 3, normal affect, Insight and Judgment appropriate.     EKG: NSR, PAC's with possible ST abnormality, T wave inversion- reviewed with Dr. Oneta Rack and agreed on referral to cardiology with ER precautions AAA: < 3  cm  Yarixa Lightcap Hollie Salk, NP 3:45 PM [facility]

## 2022-10-12 ENCOUNTER — Encounter: Payer: Self-pay | Admitting: Nurse Practitioner

## 2022-10-12 ENCOUNTER — Ambulatory Visit (INDEPENDENT_AMBULATORY_CARE_PROVIDER_SITE_OTHER): Payer: BC Managed Care – PPO | Admitting: Nurse Practitioner

## 2022-10-12 VITALS — BP 138/74 | HR 83 | Temp 97.5°F | Ht 64.5 in | Wt 183.2 lb

## 2022-10-12 DIAGNOSIS — Z13220 Encounter for screening for lipoid disorders: Secondary | ICD-10-CM

## 2022-10-12 DIAGNOSIS — K581 Irritable bowel syndrome with constipation: Secondary | ICD-10-CM

## 2022-10-12 DIAGNOSIS — E559 Vitamin D deficiency, unspecified: Secondary | ICD-10-CM

## 2022-10-12 DIAGNOSIS — Z79899 Other long term (current) drug therapy: Secondary | ICD-10-CM

## 2022-10-12 DIAGNOSIS — Z0000 Encounter for general adult medical examination without abnormal findings: Secondary | ICD-10-CM

## 2022-10-12 DIAGNOSIS — Z1389 Encounter for screening for other disorder: Secondary | ICD-10-CM

## 2022-10-12 DIAGNOSIS — R9431 Abnormal electrocardiogram [ECG] [EKG]: Secondary | ICD-10-CM

## 2022-10-12 DIAGNOSIS — M170 Bilateral primary osteoarthritis of knee: Secondary | ICD-10-CM

## 2022-10-12 DIAGNOSIS — Z136 Encounter for screening for cardiovascular disorders: Secondary | ICD-10-CM

## 2022-10-12 DIAGNOSIS — E785 Hyperlipidemia, unspecified: Secondary | ICD-10-CM

## 2022-10-12 DIAGNOSIS — Z1329 Encounter for screening for other suspected endocrine disorder: Secondary | ICD-10-CM

## 2022-10-12 DIAGNOSIS — Z8669 Personal history of other diseases of the nervous system and sense organs: Secondary | ICD-10-CM

## 2022-10-12 DIAGNOSIS — R7309 Other abnormal glucose: Secondary | ICD-10-CM

## 2022-10-12 DIAGNOSIS — D649 Anemia, unspecified: Secondary | ICD-10-CM

## 2022-10-12 DIAGNOSIS — I700 Atherosclerosis of aorta: Secondary | ICD-10-CM

## 2022-10-12 DIAGNOSIS — Z0001 Encounter for general adult medical examination with abnormal findings: Secondary | ICD-10-CM

## 2022-10-12 DIAGNOSIS — Z131 Encounter for screening for diabetes mellitus: Secondary | ICD-10-CM | POA: Diagnosis not present

## 2022-10-12 DIAGNOSIS — I10 Essential (primary) hypertension: Secondary | ICD-10-CM

## 2022-10-12 DIAGNOSIS — Z6827 Body mass index (BMI) 27.0-27.9, adult: Secondary | ICD-10-CM

## 2022-10-12 DIAGNOSIS — F419 Anxiety disorder, unspecified: Secondary | ICD-10-CM

## 2022-10-12 DIAGNOSIS — F17290 Nicotine dependence, other tobacco product, uncomplicated: Secondary | ICD-10-CM

## 2022-10-12 DIAGNOSIS — R42 Dizziness and giddiness: Secondary | ICD-10-CM

## 2022-10-12 NOTE — Patient Instructions (Addendum)
Go to the ER if any chest pain, shortness of breath, nausea, dizziness, severe HA, changes vision/speech   Angina  Angina is discomfort in the chest, neck, arm, jaw, or back. The discomfort is caused by a lack of blood in the middle layer of the heart wall (myocardium). There are four types of angina: Stable angina. This is triggered by vigorous activity or exercise. It goes away when you rest or take medicines that treat angina. This is diagnosed if you have had the symptom for more than 2 months. Unstable angina. This is a warning sign and can lead to a heart attack. This is a medical emergency. Symptoms come at rest and last a long time. Microvascular angina. This affects the small coronary arteries. Symptoms include chest pain, feeling tired, and being short of breath. The symptoms can last a long time or short time. Prinzmetal or variant angina. This is caused by a spasm of the arteries that go to your heart. What are the causes? This condition is usually caused by atherosclerosis. This is the buildup of fat and cholesterol (plaque) in your arteries. The plaque may narrow or block the artery. Other causes of angina include: Sudden spasms of the muscles of the arteries in the heart. Small artery disease (microvascular dysfunction). Problems with any of your heart valves. A tear in an artery in your heart (coronary artery dissection). Weakness of the heart muscle (cardiomyopathy). What increases the risk? You are more likely to develop this condition if you have: High cholesterol. High blood pressure. Diabetes. A family history of heart disease. A sedentary lifestyle, or a lifestyle in which you do not exercise enough. Depression. Had radiation treatment to the left side of your chest. Other risk factors include: Using tobacco. Being obese. Eating a diet high in saturated fats. Being exposed to high stress or triggers of stress. Using drugs, such as cocaine. Women have a greater  risk for angina if they: Are older than age 13. Have gone through menopause. What are the signs or symptoms? Common symptoms of this condition in both men and women may include: Chest pain, which may: Feel like a crushing or squeezing in the chest, or a tightness, pressure, fullness, or heaviness in the chest. Last for more than a few minutes, or stop and come back over a few minutes. Pain in the neck, arm, jaw, or back. Unexplained heartburn or indigestion. Shortness of breath. Nausea. Sudden cold sweats. Women and people with diabetes may have unusual (atypical) symptoms, such as: Fatigue. Unexplained feelings of nervousness or anxiety. Unexplained weakness. Dizziness or fainting. How is this diagnosed? This condition may be diagnosed based on: Your symptoms and medical history. Electrocardiogram (ECG) to measure the electrical activity in your heart. Blood tests. Stress test to look for signs of blockage when your heart is stressed. CT angiogram to examine your heart and the blood flow to it. Coronary angiogram to check for arterial blockage. Echocardiogram (ultrasound) to assess the strength of your heartbeat. How is this treated? Angina may be treated with: Medicines to: Prevent blood clots and heart attack. Relax blood vessels and improve blood flow to the heart. Reduce blood pressure, improve heart pumping, and relax blood vessels spasms. Reduce cholesterol and help treat atherosclerosis. A procedure to widen a narrowed or blocked coronary artery (angioplasty). A mesh tube (stent) may be placed in a coronary artery to keep it open. Surgery to allow blood to go around a blocked artery (coronary artery bypass surgery). Follow these instructions at home:  Medicines Take over-the-counter and prescription medicines only as told by your health care provider. Do not take the following medicines unless your health care provider approves: NSAIDs, such as ibuprofen or  naproxen. Vitamin supplements that contain vitamin A, vitamin E, or both. Hormone replacement therapy that contains estrogen with or without progestin. Eating and drinking  Eat a heart-healthy diet. This includes plenty of fresh fruits and vegetables, whole grains, low-fat (lean) protein, and low-fat dairy products. Follow instructions from your health care provider about eating or drinking restrictions. Activity Follow an exercise program approved by your health care provider. Consider joining a cardiac rehabilitation program. Take a break when you feel fatigued. Plan rest periods in your daily activities. Lifestyle  Do not use any products that contain nicotine or tobacco. These products include cigarettes, chewing tobacco, and vaping devices, such as e-cigarettes. If you need help quitting, ask your health care provider. If your health care provider says you can drink alcohol: Limit how much you have to: 0-1 drink a day for women who are not pregnant. 0-2 drinks a day for men. Be aware of how much alcohol is in your drink. In the [place] one drink equals one 12 oz bottle of beer (355 mL), one 5 oz glass of wine (148 mL), or one 1 oz glass of hard liquor (44 mL). General instructions Maintain a healthy weight. Learn to manage stress. Keep your vaccinations up to date. Get the flu (influenza) vaccine every year. Talk to your health care provider if you feel depressed. Take a depression screening test to see if you are at risk for depression. Work with your health care provider to manage other health conditions, such as hypertension or diabetes. Keep all follow-up visits. This is important. Get help right away if: You have pain in your chest, neck, arm, jaw, or back, and the pain: Lasts more than a few minutes. Is recurring. Is not relieved by taking medicines under the tongue (sublingual nitroglycerin). Increases in intensity or frequency. You have a lot of sweating without  cause. You have unexplained: Heartburn or indigestion. Shortness of breath or difficulty breathing. Nausea or vomiting. Fatigue. Feelings of nervousness or anxiety. Weakness. You have sudden light-headedness or dizziness. You faint. These symptoms may represent a serious problem that is an emergency. Do not wait to see if the symptoms will go away. Get medical help right away. Call your local emergency services (911 in the [place] Do not drive yourself to the hospital. Summary Angina is discomfort in the chest, neck, arm, jaw, or back that is caused by a lack of blood in the arteries of the heart wall. There are many symptoms of angina. They include chest pain, unexplained heartburn or indigestion, sudden cold sweats, and fatigue. Angina may be treated with lifestyle changes, medicines, or surgery. Symptoms of angina may represent an emergency. Get medical help right away. Call your local emergency services (911 in the [place] Do not drive yourself to the hospital. This information is not intended to replace advice given to you by your health care provider. Make sure you discuss any questions you have with your health care provider. Document Revised: 04/10/2020 Document Reviewed: 04/10/2020 Elsevier Patient Education  2023 [employer].

## 2022-10-13 LAB — COMPLETE METABOLIC PANEL WITH GFR
AG Ratio: 1.4 (calc) (ref 1.0–2.5)
ALT: 34 U/L — ABNORMAL HIGH (ref 6–29)
AST: 21 U/L (ref 10–35)
Albumin: 4.6 g/dL (ref 3.6–5.1)
Alkaline phosphatase (APISO): 112 U/L (ref 37–153)
BUN: 11 mg/dL (ref 7–25)
CO2: 31 mmol/L (ref 20–32)
Calcium: 10.1 mg/dL (ref 8.6–10.4)
Chloride: 100 mmol/L (ref 98–110)
Creat: 1.05 mg/dL (ref 0.50–1.05)
Globulin: 3.2 g/dL (calc) (ref 1.9–3.7)
Glucose, Bld: 76 mg/dL (ref 65–99)
Potassium: 4.8 mmol/L (ref 3.5–5.3)
Sodium: 139 mmol/L (ref 135–146)
Total Bilirubin: 0.5 mg/dL (ref 0.2–1.2)
Total Protein: 7.8 g/dL (ref 6.1–8.1)
eGFR: 60 mL/min/{1.73_m2} (ref >=60)

## 2022-10-13 LAB — URINALYSIS, ROUTINE W REFLEX MICROSCOPIC
Bilirubin Urine: NEGATIVE
Glucose, UA: NEGATIVE
Hgb urine dipstick: NEGATIVE
Ketones, ur: NEGATIVE
Leukocytes,Ua: NEGATIVE
Nitrite: NEGATIVE
Protein, ur: NEGATIVE
Specific Gravity, Urine: 1.007 (ref 1.001–1.035)
pH: 7 (ref 5.0–8.0)

## 2022-10-13 LAB — CBC WITH DIFFERENTIAL/PLATELET
Absolute Monocytes: 708 cells/uL (ref 200–950)
Basophils Absolute: 39 cells/uL (ref 0–200)
Basophils Relative: 0.5 %
Eosinophils Absolute: 177 cells/uL (ref 15–500)
Eosinophils Relative: 2.3 %
HCT: 40 % (ref 35.0–45.0)
Hemoglobin: 13.9 g/dL (ref 11.7–15.5)
Lymphs Abs: 1663 cells/uL (ref 850–3900)
MCH: 32.8 pg (ref 27.0–33.0)
MCHC: 34.8 g/dL (ref 32.0–36.0)
MCV: 94.3 fL (ref 80.0–100.0)
MPV: 11.7 fL (ref 7.5–12.5)
Monocytes Relative: 9.2 %
Neutro Abs: 5113 cells/uL (ref 1500–7800)
Neutrophils Relative %: 66.4 %
Platelets: 252 10*3/uL (ref 140–400)
RBC: 4.24 10*6/uL (ref 3.80–5.10)
RDW: 12.4 % (ref 11.0–15.0)
Total Lymphocyte: 21.6 %
WBC: 7.7 10*3/uL (ref 3.8–10.8)

## 2022-10-13 LAB — MICROALBUMIN / CREATININE URINE RATIO
Creatinine, Urine: 43 mg/dL (ref 20–275)
Microalb, Ur: <0.2 mg/dL

## 2022-10-13 LAB — VITAMIN D 25 HYDROXY (VIT D DEFICIENCY, FRACTURES): Vit D, 25-Hydroxy: 36 ng/mL (ref 30–100)

## 2022-10-13 LAB — LIPID PANEL
Cholesterol: 188 mg/dL (ref <200)
HDL: 73 mg/dL (ref >=50)
LDL Cholesterol (Calc): 90 mg/dL (calc)
Non-HDL Cholesterol (Calc): 115 mg/dL (calc) (ref <130)
Total CHOL/HDL Ratio: 2.6 (calc) (ref <5.0)
Triglycerides: 149 mg/dL (ref <150)

## 2022-10-13 LAB — HEMOGLOBIN A1C
Hgb A1c MFr Bld: 5.8 % of total Hgb — ABNORMAL HIGH (ref <5.7)
Mean Plasma Glucose: 120 mg/dL
eAG (mmol/L): 6.6 mmol/L

## 2022-10-13 LAB — TSH: TSH: 2.22 mIU/L (ref 0.40–4.50)

## 2022-10-13 LAB — MAGNESIUM: Magnesium: 2.3 mg/dL (ref 1.5–2.5)

## 2022-10-16 NOTE — Progress Notes (Unsigned)
Cardiology Office Note:    Date:  10/17/2022   ID:  [name], DOB [birth year], MRN [id number]  PCP:  [name], MD   [facility] Providers Cardiologist:  None     Referring MD: Raynelle Dick, NP   No chief complaint on file.  PACs  History of Present Illness:    Rebecca Ochoa is a 61 y.o. female with a hx below, referral for sinus rhythm with PACs.  No ischemic  ST segment changes. Minimal PVCs. Some palpitations. No syncope.  No sudden cardiac death family hx/no known coronary dx. No caffeine. 2 glasses of wine. No CP/SOB. Vapes, no smoking.  Past Medical History:  Diagnosis Date   Anemia    Asthma, mild intermittent, well-controlled    GERD (gastroesophageal reflux disease)    Hx of migraines    Hyperlipidemia    Vitamin D deficiency     Past Surgical History:  Procedure Laterality Date   BREAST SURGERY     reduction   REDUCTION MAMMAPLASTY     TUBAL LIGATION      Current Medications: Current Meds  Medication Sig   ALPRAZolam (XANAX) 0.5 MG tablet Take  1/2 to 1 tablet  at bedtime   ONLY  if needed for  Sleep & please try to limit to 5 days /week to avoid Addiction & Dementia   Cholecalciferol (VITAMIN D3) 50 MCG (2000 UT) CAPS Take 1 capsule by mouth daily.   losartan-hydrochlorothiazide (HYZAAR) 50-12.5 MG tablet Take 1 tablet by mouth daily.   meloxicam (MOBIC) 15 MG tablet Take 1/2 to 1 tablet  Daily  with Food  for Pain & Inflammation     Allergies:   Biaxin [clarithromycin]   Social History   Socioeconomic History   Marital status: Married    Spouse name: Not on file   Number of children: Not on file   Years of education: Not on file   Highest education level: Not on file  Occupational History   Not on file  Tobacco Use   Smoking status: Every Day    Types: E-cigarettes   Smokeless tobacco: Never  Vaping Use   Vaping Use: Every day  Substance and Sexual Activity   Alcohol use: Yes    Alcohol/week: 3.0 standard drinks of  alcohol    Types: 3 Standard drinks or equivalent per week   Drug use: Never   Sexual activity: Not on file  Other Topics Concern   Not on file  Social History Narrative   Not on file   Social Determinants of Health   Financial Resource Strain: Not on file  Food Insecurity: Not on file  Transportation Needs: Not on file  Physical Activity: Not on file  Stress: Not on file  Social Connections: Not on file     Family History: The patient's family history includes Diabetes in her father; Heart disease in her father; Hypertension in her father; Kidney disease in her father.  ROS:   Please see the history of present illness.     All other systems reviewed and are negative.  EKGs/Labs/Other Studies Reviewed:    The following studies were reviewed today:   EKG:  EKG is  ordered today.  The ekg ordered today demonstrates   10/14/2022- NSR with PACs with PVCs  Recent Labs: 10/12/2022: ALT 34; BUN 11; Creat 1.05; Hemoglobin 13.9; Magnesium 2.3; Platelets 252; Potassium 4.8; Sodium 139; TSH 2.22  Recent Lipid Panel    Component Value Date/Time   CHOL  188 10/12/2022 1442   TRIG 149 10/12/2022 1442   HDL 73 10/12/2022 1442   CHOLHDL 2.6 10/12/2022 1442   VLDL 21 05/31/2016 1006   LDLCALC 90 10/12/2022 1442     Risk Assessment/Calculations:         Physical Exam:    VS:  BP 130/88   Pulse 94   Wt 181 lb 3.2 oz (82.2 kg)   SpO2 99%   BMI 30.62 kg/m     Wt Readings from Last 3 Encounters:  10/17/22 181 lb 3.2 oz (82.2 kg)  10/12/22 183 lb 3.2 oz (83.1 kg)  08/22/22 183 lb 6.4 oz (83.2 kg)     GEN:  Well nourished, well developed in no acute distress HEENT: Normal NECK: No JVD; No carotid bruits LYMPHATICS: No lymphadenopathy CARDIAC: RRR, no murmurs, rubs, gallops RESPIRATORY:  Clear to auscultation without rales, wheezing or rhonchi  ABDOMEN: Soft, non-tender, non-distended MUSCULOSKELETAL:  No edema; No deformity  SKIN: Warm and dry NEUROLOGIC:  Alert  and oriented x 3 PSYCHIATRIC:  Normal affect   ASSESSMENT:    Abnormal EKG: Minimal PACs/PVCs. She does not have high risk features including syncope c/f arrhythmia , family hx of SCD, or abnormalities on her EKG.  Vertigo: has symptoms of BPPV. Recommended meclizine OTC. PLAN:    In order of problems listed above:  No further cardiac w/u      Medication Adjustments/Labs and Tests Ordered: Current medicines are reviewed at length with the patient today.  Concerns regarding medicines are outlined above.  Orders Placed This Encounter  Procedures   EKG 12-Lead   No orders of the defined types were placed in this encounter.   There are no Patient Instructions on file for this visit.   Signed, [name], MD  10/17/2022 8:35 AM    [facility]

## 2022-10-17 ENCOUNTER — Encounter: Payer: Self-pay | Admitting: Internal Medicine

## 2022-10-17 ENCOUNTER — Ambulatory Visit: Payer: BC Managed Care – PPO | Attending: Internal Medicine | Admitting: Internal Medicine

## 2022-10-17 VITALS — BP 130/88 | HR 94 | Wt 181.2 lb

## 2022-10-17 DIAGNOSIS — R9431 Abnormal electrocardiogram [ECG] [EKG]: Secondary | ICD-10-CM

## 2022-10-17 NOTE — Patient Instructions (Signed)
Medication Instructions:  No Changes In Medications at this time.  *If you need a refill on your cardiac medications before your next appointment, please call your pharmacy*  Lab Work: None Ordered At This Time.  If you have labs (blood work) drawn today and your tests are completely normal, you will receive your results only by: MyChart Message (if you have MyChart) OR A paper copy in the mail If you have any lab test that is abnormal or we need to change your treatment, we will call you to review the results.  Testing/Procedures: None Ordered At This Time.   Follow-Up: At [facility], you and your health needs are our priority.  As part of our continuing mission to provide you with exceptional heart care, we have created designated Provider Care Teams.  These Care Teams include your primary Cardiologist (physician) and Advanced Practice Providers (APPs -  Physician Assistants and Nurse Practitioners) who all work together to provide you with the care you need, when you need it.  Your next appointment:   AS NEEDED   The format for your next appointment:   In Person  Provider:   [name], MD     Other Instructions

## 2022-11-09 ENCOUNTER — Other Ambulatory Visit: Payer: Self-pay | Admitting: Nurse Practitioner

## 2022-11-09 DIAGNOSIS — F419 Anxiety disorder, unspecified: Secondary | ICD-10-CM

## 2022-11-09 MED ORDER — ALPRAZOLAM 0.5 MG PO TABS: ORAL_TABLET | ORAL | 0 refills | Status: DC

## 2022-12-13 ENCOUNTER — Other Ambulatory Visit: Payer: Self-pay | Admitting: Nurse Practitioner

## 2022-12-13 DIAGNOSIS — F419 Anxiety disorder, unspecified: Secondary | ICD-10-CM

## 2022-12-13 MED ORDER — ALPRAZOLAM 0.5 MG PO TABS: ORAL_TABLET | ORAL | 0 refills | Status: DC

## 2022-12-28 IMAGING — MG MM DIGITAL SCREENING BILAT W/ TOMO AND CAD
8 series · 8 of 24 positions shown · non-contrast
Comparison: Previous exam(s).

CLINICAL DATA: Screening.

EXAM:
DIGITAL SCREENING BILATERAL MAMMOGRAM WITH TOMOSYNTHESIS AND CAD
TECHNIQUE: Bilateral screening digital craniocaudal and mediolateral oblique
mammograms were obtained. Bilateral screening digital breast
tomosynthesis was performed. The images were evaluated with
computer-aided detection.

[L MLO synth-2D]
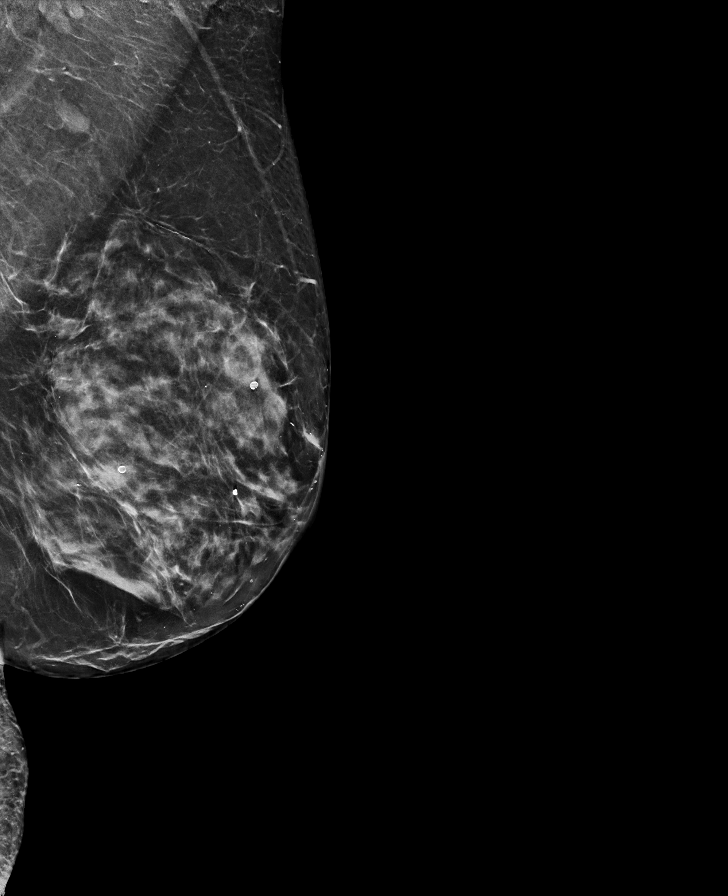

[R CC synth-2D]
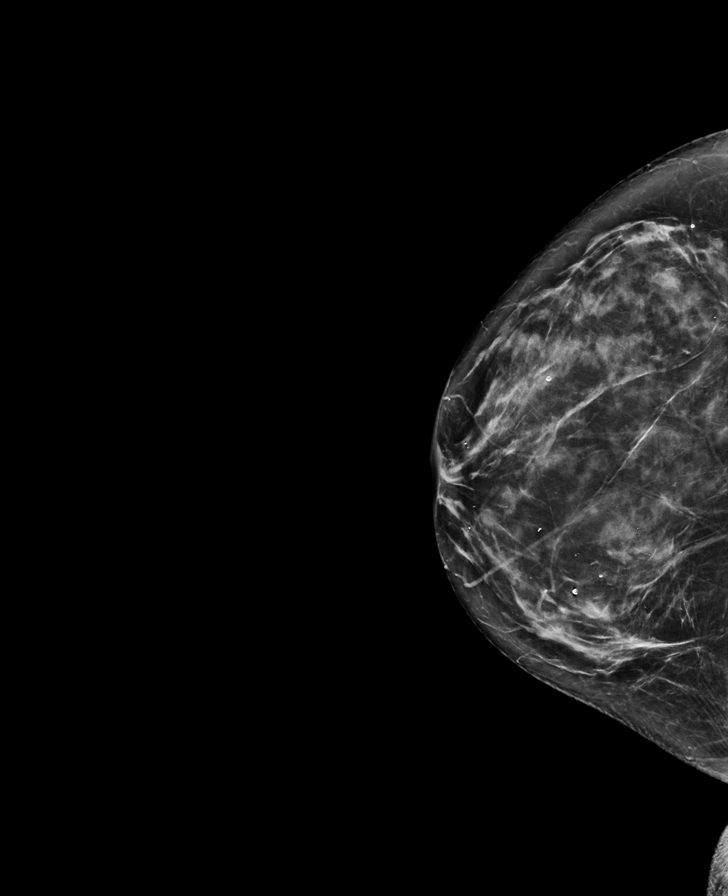

[L CC synth-2D]
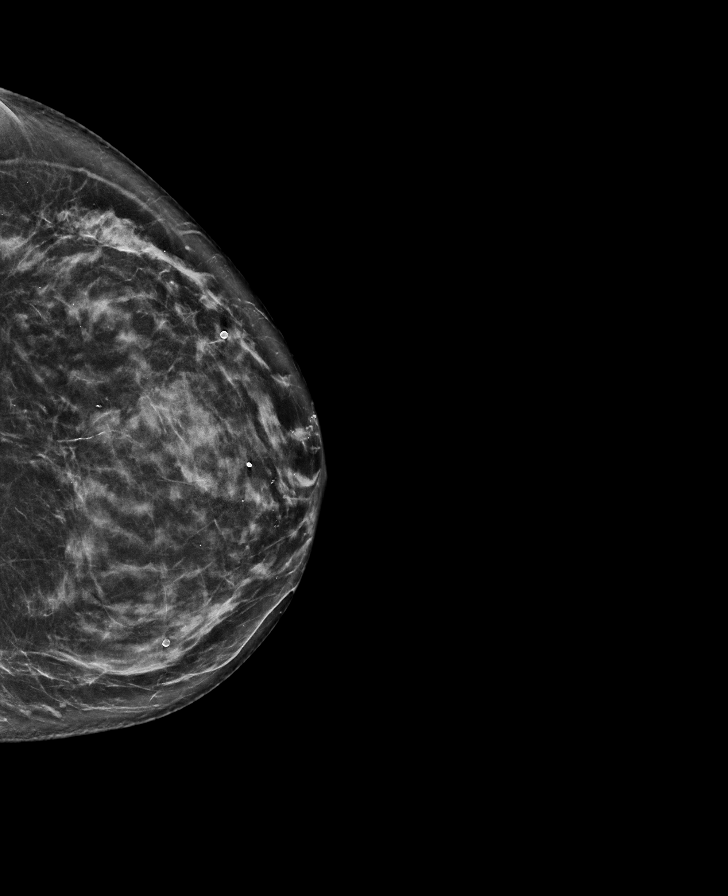

[R MLO synth-2D]
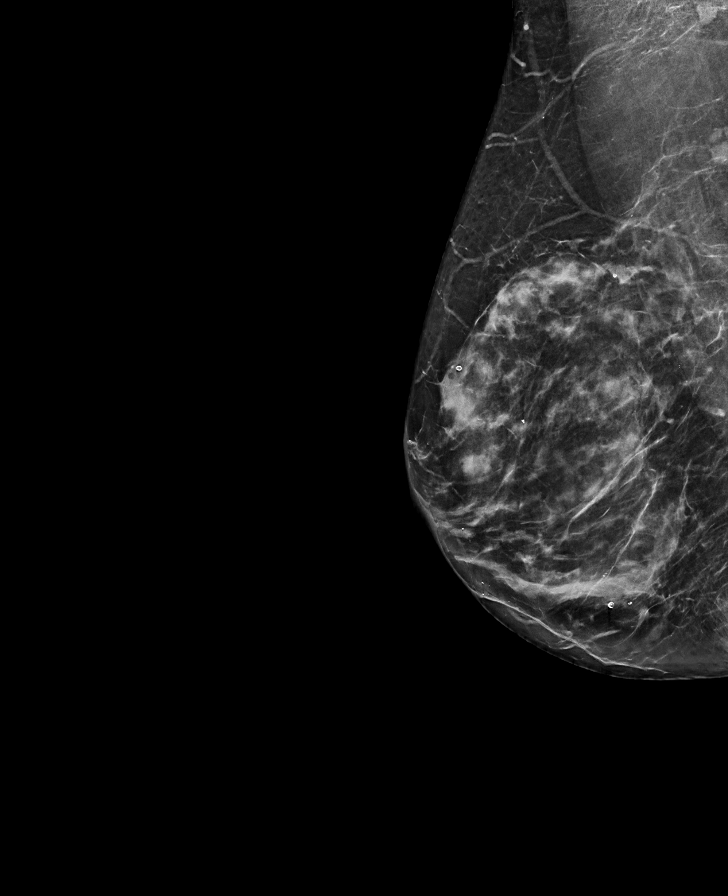

[R MLO tomo · tomo slice 37/73.0]
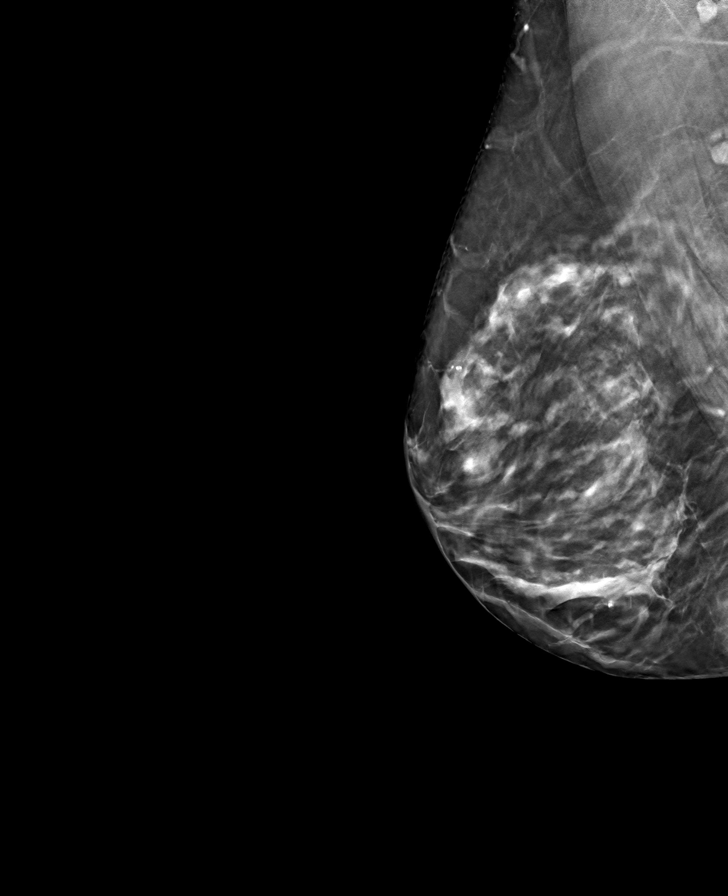

[L MLO tomo · tomo slice 39/76.0]
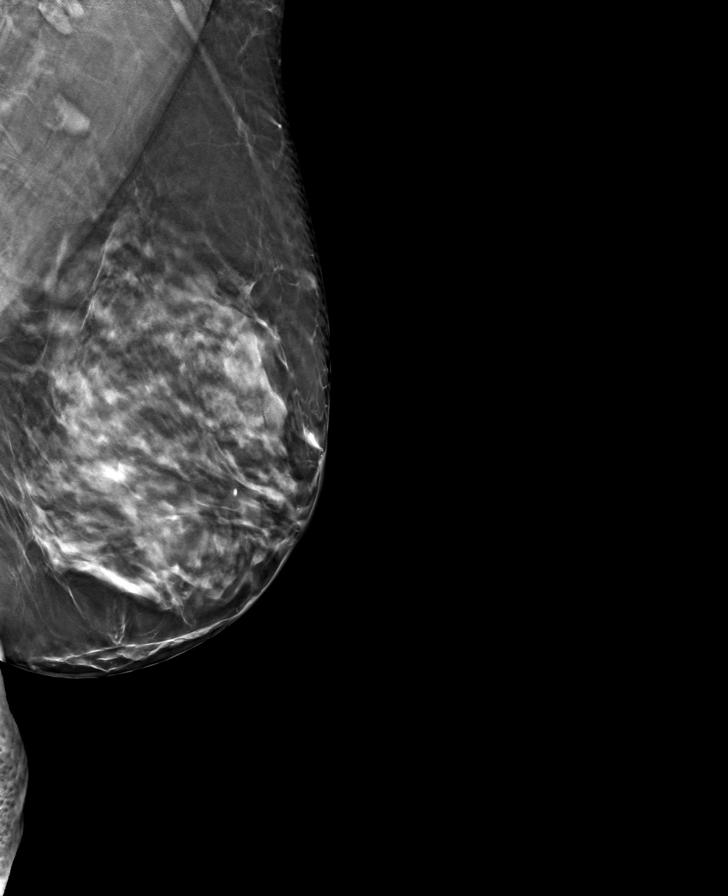

[R CC tomo · tomo slice 35/70.0]
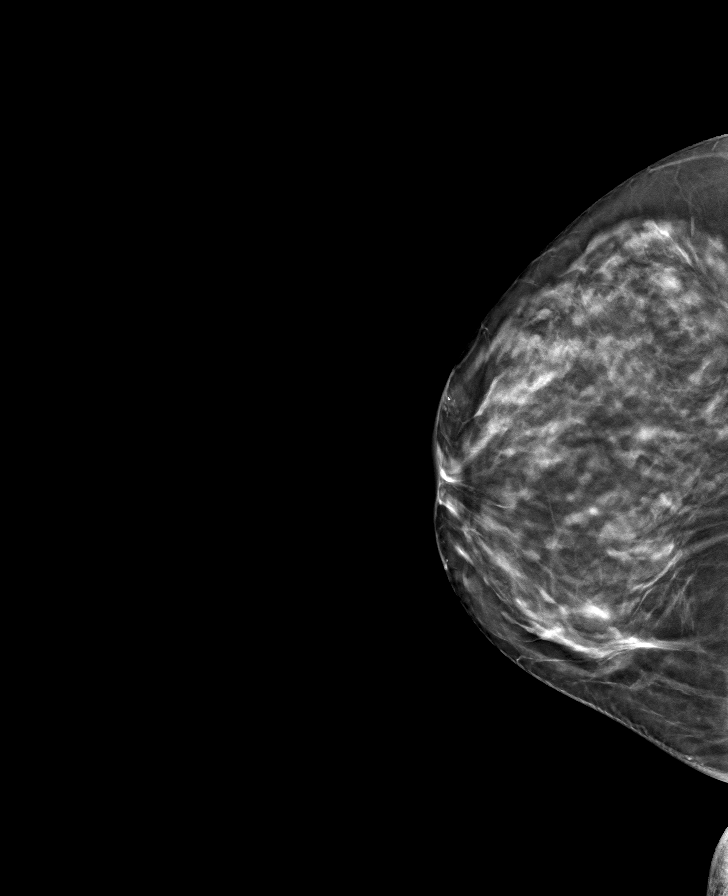

[L CC tomo · tomo slice 37/74.0]
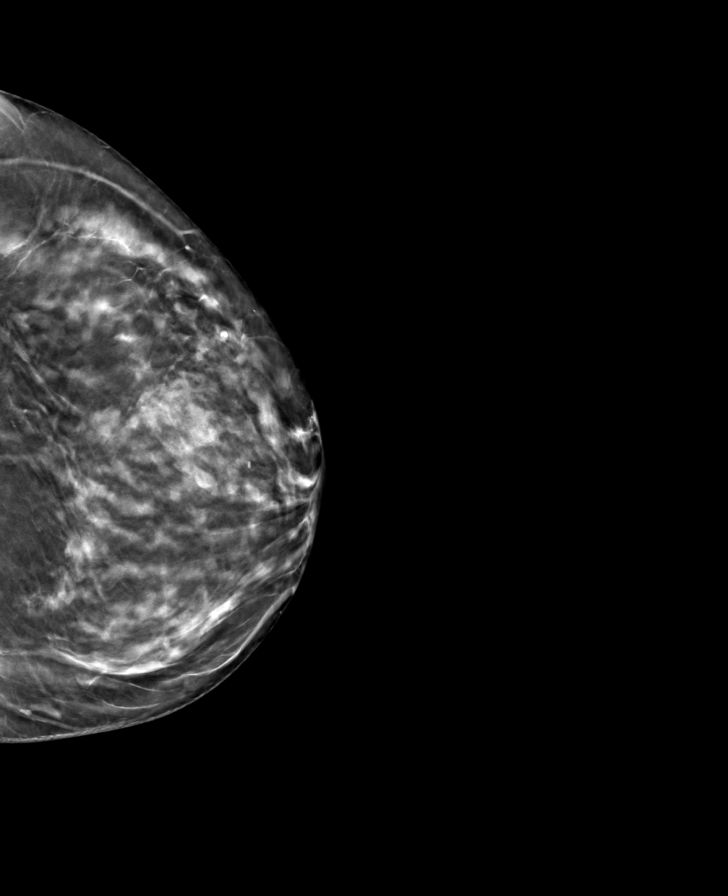

[8 of 24 positions shown; findings below may reference images not displayed]

ACR Breast Density Category c: The breast tissue is heterogeneously
dense, which may obscure small masses.
FINDINGS: There are no findings suspicious for malignancy. The images were
evaluated with computer-aided detection.
IMPRESSION: No mammographic evidence of malignancy. A result letter of this
screening mammogram will be mailed directly to the patient.

RECOMMENDATION:
Screening mammogram in one year. (Code:T4-5-GWO)

BI-RADS CATEGORY  1: Negative.

## 2023-01-09 ENCOUNTER — Other Ambulatory Visit: Payer: Self-pay | Admitting: Nurse Practitioner

## 2023-01-09 DIAGNOSIS — F419 Anxiety disorder, unspecified: Secondary | ICD-10-CM

## 2023-01-10 MED ORDER — ALPRAZOLAM 0.5 MG PO TABS: ORAL_TABLET | ORAL | 0 refills | Status: DC

## 2023-01-24 NOTE — Progress Notes (Deleted)
Assessment and Plan:  Gwennyth Niedringhaus was seen today for a follow up.  Diagnoses and all order for this visit:  1. Hyperlipidemia, unspecified hyperlipidemia type Controlled Discussed lifestyle modifications. Recommended diet heavy in fruits and veggies, omega 3's. Decrease consumption of animal meats, cheeses, and dairy products. Remain active and exercise as tolerated. Continue to monitor.   2. Anemia, unspecified type Controlled. Discussed good sources of iron include green leafy vegetables and lean red meat.  3. Irritable bowel syndrome with constipation Controlled. Discussed stress reduction. Discussed well-balanced diet.  4. Anxiety Discussed starting to wean Xanax. Patient is in agreement. Discussed other medications and methods used to treat anxiety including Buspar, meditation, good sleep hygiene.   Continue to monitor.   5. Vitamin D deficiency Continue supplement.  6. Vaping nicotine dependence, tobacco product Discussed smoking cessation.   Patient not ready to quit. Continue to monitor.   7. Overweight with body mass index (BMI) of 27 to 27.9 in adult Continue lifestyle modifications. Focus on consuming a diet heavy in fruits and veggies, omega 3's. Decrease consumption of animal meats, cheeses, and dairy products. Remain active and incorporate daily tolerated exercise. Continue to monitor weight loss.   8. Abnormal glucose Controlled. Continue to monitor.   9. Medication management All medications discussed and reviewed in full. All questions and concerns regarding medications addressed.   10.  Essential hypertension.  Monitor BP In home. If consistently >130/90 contact office for further review, evaluation and treatment. Report to ER for any increase in stroke like symptoms, including HA, N/V, paralysis, difficulty speaking, trouble walking, confusion, vision changes, CP, heart palpitations, SOB, diaphoresis.   Notify office for further  evaluation and treatment, questions or concerns if s/s fail to improve. The risks and benefits of my recommendations, as well as other treatment options were discussed with the patient today. Questions were answered.  Further disposition pending results of labs. Discussed med's effects and SE's.    Over 20 minutes of exam, counseling, chart review, and critical decision making was performed.   Future Appointments  Date Time Provider [place]  01/25/2023  3:30 PM Alycia Rossetti, NP [facility] None  10/16/2023  2:00 PM Alycia Rossetti, NP [facility] None    ------------------------------------------------------------------------------------------------------------------   HPI There were no vitals taken for this visit.   62 y.o.female presents for a follow up.  Overall she reports feeling well.  She has recently started a new job and is no longer feeling as stressed or anxious.  She continues Xanax PM and during the day only as needed.    She has stopped smoking cigarettes and started vaping.  She is continuing to gain weight.  She feels this is due to being "happier" in her relationship and no longer working a stressful job.  She is also eating fast food with her husband.     BMI is There is no height or weight on file to calculate BMI., she has not been working on diet and exercise. Wt Readings from Last 3 Encounters:  10/17/22 181 lb 3.2 oz (82.2 kg)  10/12/22 183 lb 3.2 oz (83.1 kg)  08/22/22 183 lb 6.4 oz (83.2 kg)    BP is elevated in clinic.  She is not on BP medications.  She feels this is due to her recent eating habits.  BP Readings from Last 3 Encounters:  10/17/22 130/88  10/12/22 138/74  08/22/22 130/78   Cholesterol is not at goal.  Triglycerides are elevated.  She contributes this to  dietary intake.  Lab Results  Component Value Date   CHOL 188 10/12/2022   HDL 73 10/12/2022   LDLCALC 90 10/12/2022   TRIG 149 10/12/2022   CHOLHDL 2.6 10/12/2022       Past Medical History:  Diagnosis Date   Anemia    Asthma, mild intermittent, well-controlled    GERD (gastroesophageal reflux disease)    Hx of migraines    Hyperlipidemia    Vitamin D deficiency      Allergies  Allergen Reactions   Biaxin [Clarithromycin] Nausea Only    Current Outpatient Medications on File Prior to Visit  Medication Sig   ALPRAZolam (XANAX) 0.5 MG tablet Take  1/2 to 1 tablet  at bedtime   ONLY  if needed for  Sleep & please try to limit to 5 days /week to avoid Addiction & Dementia   Cholecalciferol (VITAMIN D3) 50 MCG (2000 UT) CAPS Take 1 capsule by mouth daily.   losartan-hydrochlorothiazide (HYZAAR) 50-12.5 MG tablet Take 1 tablet by mouth daily.   meloxicam (MOBIC) 15 MG tablet Take 1/2 to 1 tablet  Daily  with Food  for Pain & Inflammation   [DISCONTINUED] bisoprolol-hydrochlorothiazide (ZIAC) 5-6.25 MG tablet Take  1 tablet  Daily  for BP   No current facility-administered medications on file prior to visit.    ROS: all negative except what is noted in the HPI.     Physical Exam:  There were no vitals taken for this visit.  General Appearance: NAD.  Awake, conversant and cooperative. Eyes: PERRLA, EOMs intact.  Sclera white.  Conjunctiva without erythema. Sinuses: No frontal/maxillary tenderness.  No nasal discharge. Nares patent.  ENT/Mouth: Ext aud canals clear.  Bilateral TMs w/DOL and without erythema or bulging. Hearing intact.  Posterior pharynx without swelling or exudate.  Tonsils without swelling or erythema.  Neck: Supple.  No masses, nodules or thyromegaly. Respiratory: Effort is regular with non-labored breathing. Breath sounds are equal bilaterally without rales, rhonchi, wheezing or stridor.  Cardio: RRR with no MRGs. Brisk peripheral pulses without edema.  Abdomen: Active BS in all four quadrants.  Soft and non-tender without guarding, rebound tenderness, hernias or masses. Lymphatics: Non tender without lymphadenopathy.   Musculoskeletal: Full ROM, 5/5 strength, normal ambulation.  No clubbing or cyanosis. Skin: Appropriate color for ethnicity. Warm without rashes, lesions, ecchymosis, ulcers.  Neuro: CN II-XII grossly normal. Normal muscle tone without cerebellar symptoms and intact sensation.   Psych: AO X 3,  appropriate mood and affect, insight and judgment.     Alycia Rossetti, NP 1:02 PM [facility]

## 2023-01-25 ENCOUNTER — Ambulatory Visit: Payer: No Typology Code available for payment source | Admitting: Nurse Practitioner

## 2023-01-25 DIAGNOSIS — I10 Essential (primary) hypertension: Secondary | ICD-10-CM

## 2023-01-25 DIAGNOSIS — K581 Irritable bowel syndrome with constipation: Secondary | ICD-10-CM

## 2023-01-25 DIAGNOSIS — E663 Overweight: Secondary | ICD-10-CM

## 2023-01-25 DIAGNOSIS — R7309 Other abnormal glucose: Secondary | ICD-10-CM

## 2023-01-25 DIAGNOSIS — E559 Vitamin D deficiency, unspecified: Secondary | ICD-10-CM

## 2023-01-25 DIAGNOSIS — E785 Hyperlipidemia, unspecified: Secondary | ICD-10-CM

## 2023-01-25 DIAGNOSIS — F17290 Nicotine dependence, other tobacco product, uncomplicated: Secondary | ICD-10-CM

## 2023-01-25 DIAGNOSIS — D649 Anemia, unspecified: Secondary | ICD-10-CM

## 2023-01-25 DIAGNOSIS — Z79899 Other long term (current) drug therapy: Secondary | ICD-10-CM

## 2023-01-30 NOTE — Progress Notes (Unsigned)
Assessment and Plan:  Rebecca Ochoa was seen today for a follow up.  Diagnoses and all order for this visit:  1. Hyperlipidemia, unspecified hyperlipidemia type Controlled Discussed lifestyle modifications. Recommended diet heavy in fruits and veggies, omega 3's. Decrease consumption of animal meats, cheeses, and dairy products. Remain active and exercise as tolerated. Continue to monitor.   2. Anemia, unspecified type Controlled. Discussed good sources of iron include green leafy vegetables and lean red meat.  3. Irritable bowel syndrome with constipation Controlled. Discussed stress reduction. Discussed well-balanced diet.  4. Anxiety Discussed starting to wean Xanax. Patient is in agreement. Discussed other medications and methods used to treat anxiety including Buspar, meditation, good sleep hygiene.   Continue to monitor.   5. Vitamin D deficiency Continue supplement.  6. Vaping nicotine dependence, tobacco product Discussed smoking cessation.   Patient not ready to quit. Continue to monitor.   7. Overweight with body mass index (BMI) of 27 to 27.9 in adult Continue lifestyle modifications. Focus on consuming a diet heavy in fruits and veggies, omega 3's. Decrease consumption of animal meats, cheeses, and dairy products. Remain active and incorporate daily tolerated exercise. Continue to monitor weight loss.   8. Abnormal glucose Controlled. Continue to monitor.   9. Medication management All medications discussed and reviewed in full. All questions and concerns regarding medications addressed.   10.  Essential hypertension.  Monitor BP In home. If consistently >130/90 contact office for further review, evaluation and treatment. Report to ER for any increase in stroke like symptoms, including HA, N/V, paralysis, difficulty speaking, trouble walking, confusion, vision changes, CP, heart palpitations, SOB, diaphoresis.   Notify office for further  evaluation and treatment, questions or concerns if s/s fail to improve. The risks and benefits of my recommendations, as well as other treatment options were discussed with the patient today. Questions were answered.  Further disposition pending results of labs. Discussed med's effects and SE's.    Over 20 minutes of exam, counseling, chart review, and critical decision making was performed.   Future Appointments  Date Time Provider [place]  01/31/2023  3:30 PM Alycia Rossetti, NP [facility] None  10/16/2023  2:00 PM Alycia Rossetti, NP [facility] None    ------------------------------------------------------------------------------------------------------------------   HPI There were no vitals taken for this visit.   62 y.o.female presents for a follow up.  Overall she reports feeling well.  She has recently started a new job and is no longer feeling as stressed or anxious.  She continues Xanax PM and during the day only as needed.    She has stopped smoking cigarettes and started vaping.  She is continuing to gain weight.  She feels this is due to being "happier" in her relationship and no longer working a stressful job.  She is also eating fast food with her husband.     BMI is There is no height or weight on file to calculate BMI., she has not been working on diet and exercise. Wt Readings from Last 3 Encounters:  10/17/22 181 lb 3.2 oz (82.2 kg)  10/12/22 183 lb 3.2 oz (83.1 kg)  08/22/22 183 lb 6.4 oz (83.2 kg)    BP is elevated in clinic.  She is not on BP medications.  She feels this is due to her recent eating habits.  BP Readings from Last 3 Encounters:  10/17/22 130/88  10/12/22 138/74  08/22/22 130/78   Cholesterol is not at goal.  Triglycerides are elevated.  She contributes this to  dietary intake.  Lab Results  Component Value Date   CHOL 188 10/12/2022   HDL 73 10/12/2022   LDLCALC 90 10/12/2022   TRIG 149 10/12/2022   CHOLHDL 2.6 10/12/2022       Past Medical History:  Diagnosis Date   Anemia    Asthma, mild intermittent, well-controlled    GERD (gastroesophageal reflux disease)    Hx of migraines    Hyperlipidemia    Vitamin D deficiency      Allergies  Allergen Reactions   Biaxin [Clarithromycin] Nausea Only    Current Outpatient Medications on File Prior to Visit  Medication Sig   ALPRAZolam (XANAX) 0.5 MG tablet Take  1/2 to 1 tablet  at bedtime   ONLY  if needed for  Sleep & please try to limit to 5 days /week to avoid Addiction & Dementia   Cholecalciferol (VITAMIN D3) 50 MCG (2000 UT) CAPS Take 1 capsule by mouth daily.   losartan-hydrochlorothiazide (HYZAAR) 50-12.5 MG tablet Take 1 tablet by mouth daily.   meloxicam (MOBIC) 15 MG tablet Take 1/2 to 1 tablet  Daily  with Food  for Pain & Inflammation   [DISCONTINUED] bisoprolol-hydrochlorothiazide (ZIAC) 5-6.25 MG tablet Take  1 tablet  Daily  for BP   No current facility-administered medications on file prior to visit.    ROS: all negative except what is noted in the HPI.     Physical Exam:  There were no vitals taken for this visit.  General Appearance: NAD.  Awake, conversant and cooperative. Eyes: PERRLA, EOMs intact.  Sclera white.  Conjunctiva without erythema. Sinuses: No frontal/maxillary tenderness.  No nasal discharge. Nares patent.  ENT/Mouth: Ext aud canals clear.  Bilateral TMs w/DOL and without erythema or bulging. Hearing intact.  Posterior pharynx without swelling or exudate.  Tonsils without swelling or erythema.  Neck: Supple.  No masses, nodules or thyromegaly. Respiratory: Effort is regular with non-labored breathing. Breath sounds are equal bilaterally without rales, rhonchi, wheezing or stridor.  Cardio: RRR with no MRGs. Brisk peripheral pulses without edema.  Abdomen: Active BS in all four quadrants.  Soft and non-tender without guarding, rebound tenderness, hernias or masses. Lymphatics: Non tender without lymphadenopathy.   Musculoskeletal: Full ROM, 5/5 strength, normal ambulation.  No clubbing or cyanosis. Skin: Appropriate color for ethnicity. Warm without rashes, lesions, ecchymosis, ulcers.  Neuro: CN II-XII grossly normal. Normal muscle tone without cerebellar symptoms and intact sensation.   Psych: AO X 3,  appropriate mood and affect, insight and judgment.     Alycia Rossetti, NP 1:03 PM [facility]

## 2023-01-31 ENCOUNTER — Encounter: Payer: Self-pay | Admitting: Nurse Practitioner

## 2023-01-31 ENCOUNTER — Ambulatory Visit (INDEPENDENT_AMBULATORY_CARE_PROVIDER_SITE_OTHER): Payer: No Typology Code available for payment source | Admitting: Nurse Practitioner

## 2023-01-31 VITALS — BP 138/78 | HR 88 | Temp 97.5°F | Ht 64.5 in | Wt 182.0 lb

## 2023-01-31 DIAGNOSIS — I10 Essential (primary) hypertension: Secondary | ICD-10-CM | POA: Diagnosis not present

## 2023-01-31 DIAGNOSIS — Z6827 Body mass index (BMI) 27.0-27.9, adult: Secondary | ICD-10-CM

## 2023-01-31 DIAGNOSIS — D649 Anemia, unspecified: Secondary | ICD-10-CM | POA: Diagnosis not present

## 2023-01-31 DIAGNOSIS — E785 Hyperlipidemia, unspecified: Secondary | ICD-10-CM | POA: Diagnosis not present

## 2023-01-31 DIAGNOSIS — K581 Irritable bowel syndrome with constipation: Secondary | ICD-10-CM | POA: Diagnosis not present

## 2023-01-31 DIAGNOSIS — F17290 Nicotine dependence, other tobacco product, uncomplicated: Secondary | ICD-10-CM

## 2023-01-31 DIAGNOSIS — E663 Overweight: Secondary | ICD-10-CM

## 2023-01-31 DIAGNOSIS — R9431 Abnormal electrocardiogram [ECG] [EKG]: Secondary | ICD-10-CM

## 2023-01-31 DIAGNOSIS — E559 Vitamin D deficiency, unspecified: Secondary | ICD-10-CM

## 2023-01-31 DIAGNOSIS — F419 Anxiety disorder, unspecified: Secondary | ICD-10-CM

## 2023-01-31 DIAGNOSIS — Z79899 Other long term (current) drug therapy: Secondary | ICD-10-CM

## 2023-01-31 DIAGNOSIS — R7989 Other specified abnormal findings of blood chemistry: Secondary | ICD-10-CM

## 2023-01-31 DIAGNOSIS — R7309 Other abnormal glucose: Secondary | ICD-10-CM

## 2023-01-31 NOTE — Patient Instructions (Signed)

## 2023-02-23 ENCOUNTER — Other Ambulatory Visit: Payer: Self-pay | Admitting: Nurse Practitioner

## 2023-02-23 DIAGNOSIS — F419 Anxiety disorder, unspecified: Secondary | ICD-10-CM

## 2023-02-23 MED ORDER — ALPRAZOLAM 0.5 MG PO TABS
ORAL_TABLET | ORAL | 0 refills | Status: DC
Start: 2023-02-23 — End: 2023-04-04

## 2023-03-03 ENCOUNTER — Ambulatory Visit (INDEPENDENT_AMBULATORY_CARE_PROVIDER_SITE_OTHER): Payer: No Typology Code available for payment source | Admitting: Nurse Practitioner

## 2023-03-03 ENCOUNTER — Encounter: Payer: Self-pay | Admitting: Nurse Practitioner

## 2023-03-03 VITALS — BP 124/80 | HR 65 | Temp 97.9°F | Resp 16 | Ht 64.5 in | Wt 179.4 lb

## 2023-03-03 DIAGNOSIS — R300 Dysuria: Secondary | ICD-10-CM

## 2023-03-03 DIAGNOSIS — I10 Essential (primary) hypertension: Secondary | ICD-10-CM

## 2023-03-03 MED ORDER — NITROFURANTOIN MONOHYD MACRO 100 MG PO CAPS
100.0000 mg | ORAL_CAPSULE | Freq: Two times a day (BID) | ORAL | 0 refills | Status: AC
Start: 2023-03-03 — End: 2023-03-10

## 2023-03-03 NOTE — Patient Instructions (Signed)
Urinary Tract Infection, Adult A urinary tract infection (UTI) is an infection of any part of the urinary tract. The urinary tract includes: The kidneys. The ureters. The bladder. The urethra. These organs make, store, and get rid of pee (urine) in the body. What are the causes? This infection is caused by germs (bacteria) in your genital area. These germs grow and cause swelling (inflammation) of your urinary tract. What increases the risk? The following factors may make you more likely to develop this condition: Using a small, thin tube (catheter) to drain pee. Not being able to control when you pee or poop (incontinence). Being female. If you are female, these things can increase the risk: Using these methods to prevent pregnancy: A medicine that kills sperm (spermicide). A device that blocks sperm (diaphragm). Having low levels of a female hormone (estrogen). Being pregnant. You are more likely to develop this condition if: You have genes that add to your risk. You are sexually active. You take antibiotic medicines. You have trouble peeing because of: A prostate that is bigger than normal, if you are female. A blockage in the part of your body that drains pee from the bladder. A kidney stone. A nerve condition that affects your bladder. Not getting enough to drink. Not peeing often enough. You have other conditions, such as: Diabetes. A weak disease-fighting system (immune system). Sickle cell disease. Gout. Injury of the spine. What are the signs or symptoms? Symptoms of this condition include: Needing to pee right away. Peeing small amounts often. Pain or burning when peeing. Blood in the pee. Pee that smells bad or not like normal. Trouble peeing. Pee that is cloudy. Fluid coming from the vagina, if you are female. Pain in the belly or lower back. Other symptoms include: Vomiting. Not feeling hungry. Feeling mixed up (confused). This may be the first symptom in  older adults. Being tired and grouchy (irritable). A fever. Watery poop (diarrhea). How is this treated? Taking antibiotic medicine. Taking other medicines. Drinking enough water. In some cases, you may need to see a specialist. Follow these instructions at home:  Medicines Take over-the-counter and prescription medicines only as told by your doctor. If you were prescribed an antibiotic medicine, take it as told by your doctor. Do not stop taking it even if you start to feel better. General instructions Make sure you: Pee until your bladder is empty. Do not hold pee for a long time. Empty your bladder after sex. Wipe from front to back after peeing or pooping if you are a female. Use each tissue one time when you wipe. Drink enough fluid to keep your pee pale yellow. Keep all follow-up visits. Contact a doctor if: You do not get better after 1-2 days. Your symptoms go away and then come back. Get help right away if: You have very bad back pain. You have very bad pain in your lower belly. You have a fever. You have chills. You feeling like you will vomit or you vomit. Summary A urinary tract infection (UTI) is an infection of any part of the urinary tract. This condition is caused by germs in your genital area. There are many risk factors for a UTI. Treatment includes antibiotic medicines. Drink enough fluid to keep your pee pale yellow. This information is not intended to replace advice given to you by your health care provider. Make sure you discuss any questions you have with your health care provider. Document Revised: 05/29/2020 Document Reviewed: 05/29/2020 Elsevier Patient Education    2023 [employer]  

## 2023-03-03 NOTE — Progress Notes (Signed)
Assessment and Plan:  Rebecca Ochoa was seen today for urinary tract infection.  Diagnoses and all orders for this visit:  Essential hypertension - continue medications, DASH diet, exercise and monitor at home. Call if greater than 130/80.   Dysuria Push fluids Begin Macrobid 100 mg BID x 7 If develop fever, abdominal pain, back pain, nausea/vomiting go to the ER -     Urinalysis, Routine w reflex microscopic -     Urine Culture -     nitrofurantoin, macrocrystal-monohydrate, (MACROBID) 100 MG capsule; Take 1 capsule (100 mg total) by mouth 2 (two) times daily for 7 days.       Further disposition pending results of labs. Discussed med's effects and SE's.   Over 30 minutes of exam, counseling, chart review, and critical decision making was performed.   Future Appointments  Date Time Provider Department Center  10/16/2023  2:00 PM Raynelle Dick, NP [facility] None    ------------------------------------------------------------------------------------------------------------------   HPI BP 124/80   Pulse 65   Temp 97.9 F (36.6 C)   Resp 16   Ht 5' 4.5" (1.638 m)   Wt 179 lb 6.4 oz (81.4 kg)   SpO2 97%   BMI 30.32 kg/m    62 y.o.female presents for Burning on urination which has been occurring x 1 week, occurred after sex . Denies blood in urine and fever. Denies abdominal pain.   BP is currently well controlled on Losartan/HCTZ 50/12.5 mg daily.  Denies headaches, chest pain, shortness of breath and dizziness BP Readings from Last 3 Encounters:  03/03/23 124/80  01/31/23 138/78  10/17/22 130/88    BMI is Body mass index is 30.32 kg/m., she has been working on diet and exercise. Wt Readings from Last 3 Encounters:  03/03/23 179 lb 6.4 oz (81.4 kg)  01/31/23 182 lb (82.6 kg)  10/17/22 181 lb 3.2 oz (82.2 kg)      Past Medical History:  Diagnosis Date   Anemia    Asthma, mild intermittent, well-controlled    GERD (gastroesophageal reflux disease)    Hx of  migraines    Hyperlipidemia    Vitamin D deficiency      Allergies  Allergen Reactions   Biaxin [Clarithromycin] Nausea Only    Current Outpatient Medications on File Prior to Visit  Medication Sig   ALPRAZolam (XANAX) 0.5 MG tablet Take  1/2 to 1 tablet  at bedtime   ONLY  if needed for  Sleep & please try to limit to 5 days /week to avoid Addiction & Dementia   Cholecalciferol (VITAMIN D3) 50 MCG (2000 UT) CAPS Take 1 capsule by mouth daily.   losartan-hydrochlorothiazide (HYZAAR) 50-12.5 MG tablet Take 1 tablet by mouth daily.   meloxicam (MOBIC) 15 MG tablet Take 1/2 to 1 tablet  Daily  with Food  for Pain & Inflammation   [DISCONTINUED] bisoprolol-hydrochlorothiazide (ZIAC) 5-6.25 MG tablet Take  1 tablet  Daily  for BP   No current facility-administered medications on file prior to visit.    ROS: all negative except above.   Physical Exam:  BP 124/80   Pulse 65   Temp 97.9 F (36.6 C)   Resp 16   Ht 5' 4.5" (1.638 m)   Wt 179 lb 6.4 oz (81.4 kg)   SpO2 97%   BMI 30.32 kg/m   General Appearance: Well nourished, in no apparent distress. Eyes: PERRLA, EOMs, conjunctiva no swelling or erythema Neck: Supple, thyroid normal.  Respiratory: Respiratory effort normal, BS equal bilaterally without  rales, rhonchi, wheezing or stridor.  Cardio: RRR with no MRGs. Brisk peripheral pulses without edema.  Abdomen: Soft, + BS.  Non tender, no guarding, rebound, hernias, masses. Lymphatics: Non tender without lymphadenopathy.  Musculoskeletal: Full ROM, 5/5 strength, normal gait. No CVA tenderness Skin: Warm, dry without rashes, lesions, ecchymosis.  Neuro: Cranial nerves intact. Normal muscle tone, no cerebellar symptoms. Sensation intact.  Psych: Awake and oriented X 3, normal affect, Insight and Judgment appropriate.     Raynelle Dick, NP 11:12 AM [facility]

## 2023-03-04 LAB — URINE CULTURE
MICRO NUMBER:: 14911472
Result:: NO GROWTH
SPECIMEN QUALITY:: ADEQUATE

## 2023-03-04 LAB — URINALYSIS, ROUTINE W REFLEX MICROSCOPIC
Bilirubin Urine: NEGATIVE
Glucose, UA: NEGATIVE
Hgb urine dipstick: NEGATIVE
Hyaline Cast: NONE SEEN /LPF
Ketones, ur: NEGATIVE
Nitrite: NEGATIVE
Protein, ur: NEGATIVE
RBC / HPF: NONE SEEN /HPF (ref 0–2)
Specific Gravity, Urine: 1.005 (ref 1.001–1.035)
pH: 7 (ref 5.0–8.0)

## 2023-03-04 LAB — MICROSCOPIC MESSAGE

## 2023-04-04 ENCOUNTER — Other Ambulatory Visit: Payer: Self-pay | Admitting: Nurse Practitioner

## 2023-04-04 DIAGNOSIS — F419 Anxiety disorder, unspecified: Secondary | ICD-10-CM

## 2023-04-04 MED ORDER — ALPRAZOLAM 0.5 MG PO TABS
ORAL_TABLET | ORAL | 0 refills | Status: DC
Start: 2023-04-04 — End: 2023-05-09

## 2023-04-22 ENCOUNTER — Ambulatory Visit
Admission: EM | Admit: 2023-04-22 | Discharge: 2023-04-22 | Disposition: A | Discharge status: Hospice | Payer: No Typology Code available for payment source | Attending: Emergency Medicine | Admitting: Emergency Medicine

## 2023-04-22 ENCOUNTER — Emergency Department (HOSPITAL_BASED_OUTPATIENT_CLINIC_OR_DEPARTMENT_OTHER): Payer: No Typology Code available for payment source

## 2023-04-22 ENCOUNTER — Emergency Department (HOSPITAL_BASED_OUTPATIENT_CLINIC_OR_DEPARTMENT_OTHER)
Admission: EM | Admit: 2023-04-22 | Discharge: 2023-04-22 | Disposition: A | Discharge status: Home / Self Care | Payer: No Typology Code available for payment source | Attending: Emergency Medicine | Admitting: Emergency Medicine

## 2023-04-22 ENCOUNTER — Encounter (HOSPITAL_BASED_OUTPATIENT_CLINIC_OR_DEPARTMENT_OTHER): Payer: Self-pay

## 2023-04-22 DIAGNOSIS — B9689 Other specified bacterial agents as the cause of diseases classified elsewhere: Secondary | ICD-10-CM | POA: Insufficient documentation

## 2023-04-22 DIAGNOSIS — Z780 Asymptomatic menopausal state: Secondary | ICD-10-CM

## 2023-04-22 DIAGNOSIS — N760 Acute vaginitis: Secondary | ICD-10-CM | POA: Insufficient documentation

## 2023-04-22 DIAGNOSIS — R9389 Abnormal findings on diagnostic imaging of other specified body structures: Secondary | ICD-10-CM | POA: Diagnosis not present

## 2023-04-22 DIAGNOSIS — N898 Other specified noninflammatory disorders of vagina: Secondary | ICD-10-CM | POA: Diagnosis present

## 2023-04-22 LAB — CBC WITH DIFFERENTIAL/PLATELET
Abs Immature Granulocytes: 0.03 10*3/uL (ref 0.00–0.07)
Basophils Absolute: 0 10*3/uL (ref 0.0–0.1)
Basophils Relative: 0 %
Eosinophils Absolute: 0.2 10*3/uL (ref 0.0–0.5)
Eosinophils Relative: 2 %
HCT: 41.1 % (ref 36.0–46.0)
Hemoglobin: 13.9 g/dL (ref 12.0–15.0)
Immature Granulocytes: 0 %
Lymphocytes Relative: 15 %
Lymphs Abs: 1.4 10*3/uL (ref 0.7–4.0)
MCH: 32.4 pg (ref 26.0–34.0)
MCHC: 33.8 g/dL (ref 30.0–36.0)
MCV: 95.8 fL (ref 80.0–100.0)
Monocytes Absolute: 0.6 10*3/uL (ref 0.1–1.0)
Monocytes Relative: 7 %
Neutro Abs: 7.1 10*3/uL (ref 1.7–7.7)
Neutrophils Relative %: 76 %
Platelets: 203 10*3/uL (ref 150–400)
RBC: 4.29 MIL/uL (ref 3.87–5.11)
RDW: 13.2 % (ref 11.5–15.5)
WBC: 9.3 10*3/uL (ref 4.0–10.5)
nRBC: 0 % (ref 0.0–0.2)

## 2023-04-22 LAB — URINALYSIS, ROUTINE W REFLEX MICROSCOPIC
Bilirubin Urine: NEGATIVE
Glucose, UA: NEGATIVE mg/dL
Hgb urine dipstick: NEGATIVE
Ketones, ur: NEGATIVE mg/dL
Leukocytes,Ua: NEGATIVE
Nitrite: NEGATIVE
Protein, ur: NEGATIVE mg/dL
Specific Gravity, Urine: <1.005 — ABNORMAL LOW (ref 1.005–1.030)
pH: 6.5 (ref 5.0–8.0)

## 2023-04-22 LAB — WET PREP, GENITAL
Sperm: NONE SEEN
Trich, Wet Prep: NONE SEEN
WBC, Wet Prep HPF POC: <10 (ref <10)
Yeast Wet Prep HPF POC: NONE SEEN

## 2023-04-22 LAB — HIV ANTIBODY (ROUTINE TESTING W REFLEX): HIV Screen 4th Generation wRfx: NONREACTIVE

## 2023-04-22 MED ORDER — METRONIDAZOLE 500 MG PO TABS
500.0000 mg | ORAL_TABLET | Freq: Once | ORAL | Status: AC
  Administered 2023-04-22: 500 mg via ORAL
  Filled 2023-04-22: qty 1

## 2023-04-22 MED ORDER — METRONIDAZOLE 500 MG PO TABS: 500.0000 mg | ORAL_TABLET | Freq: Two times a day (BID) | ORAL | 0 refills | Status: AC

## 2023-04-22 MED ORDER — METRONIDAZOLE 500 MG PO TABS: 500.0000 mg | ORAL_TABLET | Freq: Two times a day (BID) | ORAL | 0 refills | Status: DC

## 2023-04-22 NOTE — Discharge Instructions (Signed)
Thank you for letting us take care of you today.  Your swabs send positive for clue cells which is an indicator of a condition called bacterial vaginosis.  We treat this with antibiotics.  I gave you your first dose in the ED tonight.  Please start these tomorrow and complete all of those prescribed.  Your ultrasound also showed that you have some thickening of her endometrial lining of the uterus.  I am referring you to OB/GYN for further evaluation of this.  Typically this will require a biopsy to rule out any type of cancer that could be causing this thickening.  You may also contact your PCP for recommendations on OB/GYN to follow-up with for this appointment.  If you develop any new or worsening symptoms such as fever, heavy bleeding or discharge, severe abdominal pain, or other new, concerning symptoms, please return to the nearest ED for reevaluation.

## 2023-04-22 NOTE — ED Provider Notes (Signed)
[place] EMERGENCY DEPARTMENT AT [facility] Provider Note   CSN: [id number] Arrival date & time: 04/22/23  1132     History  Chief Complaint  Patient presents with   Vaginal Discharge    Rebecca Ochoa is a 62 y.o. female with past medical history anemia, migraine headaches, GERD who presents to the ED complaining of malodorous, dark vaginal discharge that she noticed 2 days ago.  She states that she has a mild associated pelvic pain but no fever, chills, nausea, vomiting, diarrhea, dysuria, hematuria, frequency, or other complaints.  No history of this.  Patient is in a monogamous relationship with 1 long-term partner.  No concern for STDs.  States that she is postmenopausal.  No history of abnormal Pap smears.  No recent pelvic exams.      Home Medications Prior to Admission medications   Medication Sig Start Date End Date Taking? Authorizing Provider  ALPRAZolam Prudy Feeler) 0.5 MG tablet Take  1/2 to 1 tablet  at bedtime   ONLY  if needed for  Sleep & please try to limit to 5 days /week to avoid Addiction & Dementia 04/04/23   Raynelle Dick, NP  Cholecalciferol (VITAMIN D3) 50 MCG (2000 UT) CAPS Take 1 capsule by mouth daily.    [provider]  losartan-hydrochlorothiazide (HYZAAR) 50-12.5 MG tablet Take 1 tablet by mouth daily. 07/11/22 07/11/23  Raynelle Dick, NP  meloxicam (MOBIC) 15 MG tablet Take 1/2 to 1 tablet  Daily  with Food  for Pain & Inflammation 06/06/22   [name], MD  metroNIDAZOLE (FLAGYL) 500 MG tablet Take 1 tablet (500 mg total) by mouth 2 (two) times daily for 7 days. 04/23/23 04/30/23  Kerrie Timm, Lawrence Marseilles, PA-C  bisoprolol-hydrochlorothiazide [facility]) 5-6.25 MG tablet Take  1 tablet  Daily  for BP 06/06/22 07/11/22  [name], MD      Allergies    Biaxin [clarithromycin]    Review of Systems   Review of Systems  All other systems reviewed and are negative.   Physical Exam Updated Vital Signs BP 134/74 (BP Location: Right Arm)    Pulse 86   Temp 98 F (36.7 C) (Oral)   Resp 16   SpO2 100%  Physical Exam Vitals and nursing note reviewed.  Constitutional:      General: She is not in acute distress.    Appearance: Normal appearance. She is not ill-appearing or toxic-appearing.  HENT:     Head: Normocephalic and atraumatic.     Mouth/Throat:     Mouth: Mucous membranes are moist.  Eyes:     Conjunctiva/sclera: Conjunctivae normal.  Cardiovascular:     Rate and Rhythm: Normal rate and regular rhythm.     Heart sounds: No murmur heard. Pulmonary:     Effort: Pulmonary effort is normal.     Breath sounds: Normal breath sounds.  Abdominal:     General: Abdomen is flat. There is no distension.     Palpations: Abdomen is soft.     Tenderness: There is no abdominal tenderness. There is no right CVA tenderness, left CVA tenderness, guarding or rebound.  Musculoskeletal:        General: Normal range of motion.     Cervical back: Neck supple.     Right lower leg: No edema.     Left lower leg: No edema.  Skin:    General: Skin is warm and dry.     Capillary Refill: Capillary refill takes less than 2 seconds.  Neurological:  Mental Status: She is alert. Mental status is at baseline.  Psychiatric:        Behavior: Behavior normal.     ED Results / Procedures / Treatments   Labs (all labs ordered are listed, but only abnormal results are displayed) Labs Reviewed  WET PREP, GENITAL - Abnormal; Notable for the following components:      Result Value   Clue Cells Wet Prep HPF POC PRESENT (*)    All other components within normal limits  URINALYSIS, ROUTINE W REFLEX MICROSCOPIC - Abnormal; Notable for the following components:   Color, Urine COLORLESS (*)    Specific Gravity, Urine <1.005 (*)    All other components within normal limits  CBC WITH DIFFERENTIAL/PLATELET  HIV ANTIBODY (ROUTINE TESTING W REFLEX)  RPR  GC/CHLAMYDIA PROBE AMP ([place]) NOT AT [facility]    EKG None  Radiology US PELVIC  COMPLETE WITH TRANSVAGINAL  Result Date: 04/22/2023 CLINICAL DATA:  Manson Passey vaginal discharge with mild pelvic cramping EXAM: TRANSABDOMINAL AND TRANSVAGINAL ULTRASOUND OF PELVIS TECHNIQUE: Both transabdominal and transvaginal ultrasound examinations of the pelvis were performed. Transabdominal technique was performed for global imaging of the pelvis including uterus, ovaries, adnexal regions, and pelvic cul-de-sac. It was necessary to proceed with endovaginal exam following the transabdominal exam to visualize the uterus, endometrium, and ovaries. COMPARISON:  None Available. FINDINGS: Uterus Measurements: 7.3 x 3.4 x 4.3 cm = volume: 55 mL. No fibroids or other mass visualized. Endometrium Thickness: 17 mm. Thick heterogenous appearance with multiple cystic areas. Right ovary Not visualized. Left ovary Not visualized. Other findings No abnormal free fluid. IMPRESSION: 1. Thick heterogenous appearance of the endometrium measuring 17 mm with multiple cystic areas. Differential diagnosis includes endometrial hyperplasia, endometrial polyp, or endometrial carcinoma. In the setting of post-menopausal bleeding, endometrial sampling is indicated to exclude carcinoma. If results are benign, sonohysterogram should be considered for focal lesion work-up. (Ref: Radiological Reasoning: Algorithmic Workup of Abnormal Vaginal Bleeding with Endovaginal Sonography and Sonohysterography. AJR 2008; 161:W96-04) Electronically Signed   By: Minerva Fester M.D.   On: 04/22/2023 19:02    Procedures Procedures    Medications Ordered in ED Medications  metroNIDAZOLE (FLAGYL) tablet 500 mg (has no administration in time range)    ED Course/ Medical Decision Making/ A&P                             Medical Decision Making Amount and/or Complexity of Data Reviewed Labs: ordered. Decision-making details documented in ED Course. Radiology: ordered. Decision-making details documented in ED Course.  Risk Prescription drug  management.   Medical Decision Making:   Rebecca Ochoa is a 62 y.o. female who presented to the ED today with vaginal discharge detailed above.    Additional history discussed with patient's family/caregivers.  Complete initial physical exam performed, notably the patient  was in no acute distress.  Abdomen soft, nontender.  No rebound, guarding, or peritoneal signs.  No CVA tenderness.  Patient nontoxic-appearing.    Reviewed and confirmed nursing documentation for past medical history, family history, social history.    Initial Assessment:   With the patient's presentation of abdominal pain, differential diagnosis includes but is not limited to AAA, mesenteric ischemia, appendicitis, diverticulitis, DKA, gastritis, gastroenteritis, AMI, nephrolithiasis, pancreatitis, peritonitis, adrenal insufficiency, intestinal ischemia, constipation, UTI, SBO/LBO, splenic rupture, biliary disease, IBD, IBS, PUD, hepatitis, STD, ovarian/testicular torsion, electrolyte disturbance, DKA, dehydration, acute kidney injury, renal failure, cholecystitis, cholelithiasis, choledocholithiasis, abdominal pain of  unknown  etiology, STD/PID, endometrial cancer.   Initial Plan:  Screening labs including CBC and Metabolic panel to evaluate for infectious or metabolic etiology of disease. Appears as if metabolic panel cancelled by lab. Will contact and attempt to add on. Was not made aware of this and patient ready to be discharged. If possible will add on metabolic and follow up on results and contact pt as needed for significant abnormalities. Urinalysis with reflex culture ordered to evaluate for UTI or relevant urologic/nephrologic pathology.  [place] to evaluate for intra-abdominal pathology Symptomatic management Objective evaluation as reviewed   Initial Study Results:   Laboratory  All laboratory results reviewed without evidence of clinically relevant pathology.   Exceptions include: Wet prep positive for clue  cells  Radiology:  All images reviewed independently. Agree with radiology report at this time.   US PELVIC COMPLETE WITH TRANSVAGINAL  Result Date: 04/22/2023 CLINICAL DATA:  Manson Passey vaginal discharge with mild pelvic cramping EXAM: TRANSABDOMINAL AND TRANSVAGINAL ULTRASOUND OF PELVIS TECHNIQUE: Both transabdominal and transvaginal ultrasound examinations of the pelvis were performed. Transabdominal technique was performed for global imaging of the pelvis including uterus, ovaries, adnexal regions, and pelvic cul-de-sac. It was necessary to proceed with endovaginal exam following the transabdominal exam to visualize the uterus, endometrium, and ovaries. COMPARISON:  None Available. FINDINGS: Uterus Measurements: 7.3 x 3.4 x 4.3 cm = volume: 55 mL. No fibroids or other mass visualized. Endometrium Thickness: 17 mm. Thick heterogenous appearance with multiple cystic areas. Right ovary Not visualized. Left ovary Not visualized. Other findings No abnormal free fluid. IMPRESSION: 1. Thick heterogenous appearance of the endometrium measuring 17 mm with multiple cystic areas. Differential diagnosis includes endometrial hyperplasia, endometrial polyp, or endometrial carcinoma. In the setting of post-menopausal bleeding, endometrial sampling is indicated to exclude carcinoma. If results are benign, sonohysterogram should be considered for focal lesion work-up. (Ref: Radiological Reasoning: Algorithmic Workup of Abnormal Vaginal Bleeding with Endovaginal Sonography and Sonohysterography. AJR 2008; 213:Y86-57) Electronically Signed   By: Minerva Fester M.D.   On: 04/22/2023 19:02      Final Assessment and Plan:   This is a 62 year old female who presents to the ED complaining of vaginal discharge and pelvic pain.  She is postmenopausal.  Describes discharge as dark and malodorous.  She is in a monogamous relationship.  She is nontoxic-appearing.  Reassuring vital signs.  Abdomen soft and nontender.  No distention.   No CVA tenderness.  No peritoneal signs.  Patient declines pelvic exam in favor of self swabbing but is agreeable with proceeding with pelvic ultrasound.  No history of similar discharge.  No concern for STDs so low suspicion for PID.  Patient denies any heavy discharge or bleeding.  States that it is only present when she wipes.  Hemoglobin normal.  No leukocytosis.  Patient agreeable with obtaining STD testing despite having a very low suspicion for this.  Ultrasound shows a thickened endometrium.  Discussed in detail with patient that she needs to follow-up closely with OB/GYN for an endometrial biopsy and further management.  Patient is agreeable to do so.  Wet prep does show clue cells.  Discussed this finding as well with patient and will treat for bacterial vaginosis.  First dose of metronidazole given in the ED today.  Unfortunately, metabolic panel was canceled by lab and when they were contacted they stated that they did not have the tube needed.  Patient has had an extensive ED stay at 8 hours.  Overall CBC is reassuring and she is well-appearing.  Will forego we obtaining this in favor of discharging patient is believe that her symptoms are consistent with the findings found in remainder of workup.  Patient agreeable with treatment plan and agreeable to follow-up closely with OB/GYN and primary care as needed.  Strict ED return precautions given, all questions answered, and stable for discharge.   Clinical Impression:  1. Bacterial vaginosis   2. Endometrial thickening on ultrasound      Discharge           Final Clinical Impression(s) / ED Diagnoses Final diagnoses:  Bacterial vaginosis  Endometrial thickening on ultrasound    Rx / DC Orders ED Discharge Orders          Ordered    metroNIDAZOLE (FLAGYL) 500 MG tablet  2 times daily,   Status:  Discontinued        04/22/23 1942    metroNIDAZOLE (FLAGYL) 500 MG tablet  2 times daily        04/22/23 1944    Ambulatory referral  to Obstetrics / Gynecology        04/22/23 1942              Tonette Lederer, PA-C 04/22/23 2004    Franne Forts, DO 04/25/23 1612

## 2023-04-22 NOTE — ED Notes (Signed)
Patient is being discharged from the Urgent Care and sent to the Emergency Department via POV . Per provider, patient is in need of higher level of care due to abnormal vaginal bleeding and abdominal pain. Patient is aware and verbalizes understanding of plan of care.  Vitals:   04/22/23 1048  BP: (!) 153/88  Pulse: 88  Resp: 18  Temp: 97.8 F (36.6 C)  SpO2: 98%

## 2023-04-22 NOTE — ED Provider Notes (Signed)
Patient presents to urgent care with a chief complaint of dark-colored vaginal discharge concerning for blood.  Patient states the discharge has a foul odor as well and that she has been experiencing lower abdominal pain for the past 2 days.  Patient states she has been post menopausal for several years.  Patient states she has never had this kind of discharge in the past.  Patient states she still has her uterus and ovaries.  Patient reports being sexually active with her husband, denies any possible chance of STD exposure.  Patient has elevated blood pressure on arrival and is anxious appearing.  Patient has normal vital signs otherwise.  Patient advised that I recommend imaging of her pelvis either by ultrasound or CT scan to evaluate for possible causes of her discomfort at this time.  Patient advised that it is also important that vaginal cancer is ruled out soon as possible.  Patient advised that she may wish to go to the emergency room to expedite this process where she may wish to follow-up with her PCP and that the decision to go to the emergency room is related to her own discretion.  Patient asked which emergency I would recommend that she go to should she decide to go and I advised her to go to the [facility] location as the simple imaging and possibly some blood work can be performed there hopefully without as long of a wait as she would experience that the [facility], ED.  Patient verbalized that she will be going to the emergency room at [facility].  Patient deemed stable to discharge for self transport via private vehicle now.   Theadora Rama Scales, PA-C 04/22/23 1252

## 2023-04-22 NOTE — ED Triage Notes (Signed)
Pt reports she has some dark colored blood when wiping, a lot of dark brown discharge, foul odor, and lower abdominal pain x 2 days.

## 2023-04-22 NOTE — ED Notes (Signed)
Patient transported to US 

## 2023-04-22 NOTE — ED Triage Notes (Signed)
She reports having a "real dark" malodorous vaginal d/c x 2 days. She denies fever/n/v/d and is in no distress.

## 2023-04-23 LAB — RPR: RPR Ser Ql: NONREACTIVE

## 2023-04-23 LAB — GC/CHLAMYDIA PROBE AMP (~~LOC~~) NOT AT ARMC: Neisseria Gonorrhea: NEGATIVE

## 2023-04-24 LAB — GC/CHLAMYDIA PROBE AMP (~~LOC~~) NOT AT ARMC
Chlamydia: NEGATIVE
Comment: NEGATIVE
Comment: NORMAL

## 2023-04-27 ENCOUNTER — Encounter: Payer: Self-pay | Admitting: Nurse Practitioner

## 2023-04-27 ENCOUNTER — Ambulatory Visit (INDEPENDENT_AMBULATORY_CARE_PROVIDER_SITE_OTHER): Payer: No Typology Code available for payment source | Admitting: Nurse Practitioner

## 2023-04-27 VITALS — BP 148/62 | HR 87 | Temp 97.7°F | Ht 64.5 in | Wt 178.6 lb

## 2023-04-27 DIAGNOSIS — N760 Acute vaginitis: Secondary | ICD-10-CM

## 2023-04-27 DIAGNOSIS — J301 Allergic rhinitis due to pollen: Secondary | ICD-10-CM

## 2023-04-27 DIAGNOSIS — N939 Abnormal uterine and vaginal bleeding, unspecified: Secondary | ICD-10-CM | POA: Diagnosis not present

## 2023-04-27 DIAGNOSIS — B9689 Other specified bacterial agents as the cause of diseases classified elsewhere: Secondary | ICD-10-CM

## 2023-04-27 NOTE — Progress Notes (Signed)
Assessment and Plan:  Rebecca Ochoa was seen today for cough.  Diagnoses and all orders for this visit:  Seasonal allergic rhinitis due to pollen Begin Zyrtec or Allegra Begin Flonase or Nasonex  Abnormal uterine bleeding Call and schedule appt with GYN to evaluate- U/S revealed thickened endometrium and continues to have dark brown bleeding  Bacterial vaginosis Complete course of Flagyl given at the ER      Further disposition pending results of labs. Discussed med's effects and SE's.   Over 30 minutes of exam, counseling, chart review, and critical decision making was performed.   Future Appointments  Date Time Provider Department Center  10/16/2023  2:00 PM Raynelle Dick, NP [facility] None    ------------------------------------------------------------------------------------------------------------------   HPI BP (!) 148/62   Pulse 87   Temp 97.7 F (36.5 C)   Ht 5' 4.5" (1.638 m)   Wt 178 lb 9.6 oz (81 kg)   SpO2 98%   BMI 30.18 kg/m   62 y.o.female presents for evaluation of a dry cough which started 1 week ago. She has some mild headaches and clear nasal drainage.  Denies body aches, fevers, sore throat, nausea, vomiting and diarrhea.    She is currently on Hyzaar 50/12.5 mg every day . Denies chest pain , shortness of breath and dizziness.  BP Readings from Last 3 Encounters:  04/27/23 (!) 148/62  04/22/23 122/70  03/03/23 124/80   She was seen at the ER for postmenopausal vaginal bleeding. She had U/S that revealed: Thick heterogenous appearance of the endometrium measuring 17 mm with multiple cystic areas. Differential diagnosis includes endometrial hyperplasia, endometrial polyp, or endometrial carcinoma. In the setting of post-menopausal bleeding, endometrial sampling is indicated to exclude carcinoma. If results are benign, sonohysterogram should be considered for focal lesion work-up  She has been referred to GYN- has been having more brownish blood  the  past few days.   Past Medical History:  Diagnosis Date   Anemia    Asthma, mild intermittent, well-controlled    GERD (gastroesophageal reflux disease)    Hx of migraines    Hyperlipidemia    Vitamin D deficiency      Allergies  Allergen Reactions   Biaxin [Clarithromycin] Nausea Only    Current Outpatient Medications on File Prior to Visit  Medication Sig   ALPRAZolam (XANAX) 0.5 MG tablet Take  1/2 to 1 tablet  at bedtime   ONLY  if needed for  Sleep & please try to limit to 5 days /week to avoid Addiction & Dementia   losartan-hydrochlorothiazide (HYZAAR) 50-12.5 MG tablet Take 1 tablet by mouth daily.   meloxicam (MOBIC) 15 MG tablet Take 1/2 to 1 tablet  Daily  with Food  for Pain & Inflammation   metroNIDAZOLE (FLAGYL) 500 MG tablet Take 1 tablet (500 mg total) by mouth 2 (two) times daily for 7 days.   Cholecalciferol (VITAMIN D3) 50 MCG (2000 UT) CAPS Take 1 capsule by mouth daily. (Patient not taking: Reported on 04/27/2023)   [DISCONTINUED] bisoprolol-hydrochlorothiazide (ZIAC) 5-6.25 MG tablet Take  1 tablet  Daily  for BP   No current facility-administered medications on file prior to visit.    ROS: all negative except above.   Physical Exam:  BP (!) 148/62   Pulse 87   Temp 97.7 F (36.5 C)   Ht 5' 4.5" (1.638 m)   Wt 178 lb 9.6 oz (81 kg)   SpO2 98%   BMI 30.18 kg/m   General Appearance: Well nourished, in  no apparent distress. Eyes: PERRLA, EOMs, conjunctiva no swelling or erythema Sinuses: No Frontal/maxillary tenderness ENT/Mouth: Ext aud canals clear, TMs without erythema, bulging. No erythema, swelling, or exudate on post pharynx.  Hearing normal. Nares are pale Neck: Supple, thyroid normal.  Respiratory: Respiratory effort normal, BS equal bilaterally without rales, rhonchi, wheezing or stridor.  Cardio: RRR with no MRGs. Brisk peripheral pulses without edema.  Abdomen: Soft, + BS.  Non tender, no guarding, rebound, hernias, masses. Lymphatics:  Non tender without lymphadenopathy.  Musculoskeletal: Full ROM, 5/5 strength, normal gait.  Skin: Warm, dry without rashes, lesions, ecchymosis.  Neuro: Cranial nerves intact. Normal muscle tone, no cerebellar symptoms. Sensation intact.  Psych: Awake and oriented X 3, normal affect, Insight and Judgment appropriate.     Raynelle Dick, NP 3:47 PM [facility]

## 2023-05-03 DIAGNOSIS — N950 Postmenopausal bleeding: Secondary | ICD-10-CM | POA: Insufficient documentation

## 2023-05-09 ENCOUNTER — Other Ambulatory Visit: Payer: Self-pay | Admitting: Nurse Practitioner

## 2023-05-09 DIAGNOSIS — F419 Anxiety disorder, unspecified: Secondary | ICD-10-CM

## 2023-05-09 MED ORDER — ALPRAZOLAM 0.5 MG PO TABS
ORAL_TABLET | ORAL | 0 refills | Status: DC
Start: 2023-05-09 — End: 2023-06-12

## 2023-05-29 NOTE — Progress Notes (Unsigned)
Assessment and Plan:  There are no diagnoses linked to this encounter.    Further disposition pending results of labs. Discussed med's effects and SE's.   Over 30 minutes of exam, counseling, chart review, and critical decision making was performed.   Future Appointments  Date Time Provider Department Center  05/30/2023  9:45 AM Raynelle Dick, NP [facility] None  10/16/2023  2:00 PM Raynelle Dick, NP [facility] None    ------------------------------------------------------------------------------------------------------------------   HPI There were no vitals taken for this visit. 62 y.o.female presents for  Past Medical History:  Diagnosis Date   Anemia    Asthma, mild intermittent, well-controlled    GERD (gastroesophageal reflux disease)    Hx of migraines    Hyperlipidemia    Vitamin D deficiency      Allergies  Allergen Reactions   Biaxin [Clarithromycin] Nausea Only    Current Outpatient Medications on File Prior to Visit  Medication Sig   ALPRAZolam (XANAX) 0.5 MG tablet Take  1/2 to 1 tablet  at bedtime   ONLY  if needed for  Sleep & please try to limit to 5 days /week to avoid Addiction & Dementia. Attempting to wean all patients off benzodiazepines   losartan-hydrochlorothiazide (HYZAAR) 50-12.5 MG tablet Take 1 tablet by mouth daily.   meloxicam (MOBIC) 15 MG tablet Take 1/2 to 1 tablet  Daily  with Food  for Pain & Inflammation   [DISCONTINUED] bisoprolol-hydrochlorothiazide (ZIAC) 5-6.25 MG tablet Take  1 tablet  Daily  for BP   No current facility-administered medications on file prior to visit.    ROS: all negative except above.   Physical Exam:  There were no vitals taken for this visit.  General Appearance: Well nourished, in no apparent distress. Eyes: PERRLA, EOMs, conjunctiva no swelling or erythema Sinuses: No Frontal/maxillary tenderness ENT/Mouth: Ext aud canals clear, TMs without erythema, bulging. No erythema, swelling, or exudate  on post pharynx.  Tonsils not swollen or erythematous. Hearing normal.  Neck: Supple, thyroid normal.  Respiratory: Respiratory effort normal, BS equal bilaterally without rales, rhonchi, wheezing or stridor.  Cardio: RRR with no MRGs. Brisk peripheral pulses without edema.  Abdomen: Soft, + BS.  Non tender, no guarding, rebound, hernias, masses. Lymphatics: Non tender without lymphadenopathy.  Musculoskeletal: Full ROM, 5/5 strength, normal gait.  Skin: Warm, dry without rashes, lesions, ecchymosis.  Neuro: Cranial nerves intact. Normal muscle tone, no cerebellar symptoms. Sensation intact.  Psych: Awake and oriented X 3, normal affect, Insight and Judgment appropriate.     Raynelle Dick, NP 12:36 PM [facility]

## 2023-05-30 ENCOUNTER — Ambulatory Visit (INDEPENDENT_AMBULATORY_CARE_PROVIDER_SITE_OTHER): Payer: No Typology Code available for payment source | Admitting: Nurse Practitioner

## 2023-05-30 ENCOUNTER — Encounter: Payer: Self-pay | Admitting: Nurse Practitioner

## 2023-05-30 VITALS — BP 138/72 | HR 86 | Temp 97.3°F | Ht 64.5 in | Wt 176.8 lb

## 2023-05-30 DIAGNOSIS — J069 Acute upper respiratory infection, unspecified: Secondary | ICD-10-CM | POA: Diagnosis not present

## 2023-05-30 DIAGNOSIS — I10 Essential (primary) hypertension: Secondary | ICD-10-CM | POA: Diagnosis not present

## 2023-05-30 MED ORDER — ALBUTEROL SULFATE HFA 108 (90 BASE) MCG/ACT IN AERS
2.0000 | INHALATION_SPRAY | Freq: Four times a day (QID) | RESPIRATORY_TRACT | 2 refills | Status: DC | PRN
Start: 2023-05-30 — End: 2023-10-10

## 2023-05-30 MED ORDER — AZITHROMYCIN 250 MG PO TABS
ORAL_TABLET | ORAL | 1 refills | Status: DC
Start: 2023-05-30 — End: 2023-06-19

## 2023-05-30 MED ORDER — DEXAMETHASONE 4 MG PO TABS
ORAL_TABLET | ORAL | 0 refills | Status: DC
Start: 2023-05-30 — End: 2023-06-19

## 2023-05-30 NOTE — Patient Instructions (Signed)

## 2023-06-12 ENCOUNTER — Other Ambulatory Visit: Payer: Self-pay | Admitting: Nurse Practitioner

## 2023-06-12 DIAGNOSIS — F419 Anxiety disorder, unspecified: Secondary | ICD-10-CM

## 2023-06-12 MED ORDER — ALPRAZOLAM 0.5 MG PO TABS
ORAL_TABLET | ORAL | 0 refills | Status: DC
Start: 2023-06-12 — End: 2023-07-13

## 2023-06-19 ENCOUNTER — Encounter: Payer: Self-pay | Admitting: Nurse Practitioner

## 2023-06-19 ENCOUNTER — Ambulatory Visit (INDEPENDENT_AMBULATORY_CARE_PROVIDER_SITE_OTHER): Payer: No Typology Code available for payment source | Admitting: Nurse Practitioner

## 2023-06-19 ENCOUNTER — Other Ambulatory Visit: Payer: Self-pay | Admitting: Nurse Practitioner

## 2023-06-19 VITALS — BP 102/60 | HR 92 | Temp 97.9°F | Ht 64.5 in | Wt 178.2 lb

## 2023-06-19 DIAGNOSIS — Z1152 Encounter for screening for COVID-19: Secondary | ICD-10-CM | POA: Diagnosis not present

## 2023-06-19 DIAGNOSIS — J45909 Unspecified asthma, uncomplicated: Secondary | ICD-10-CM

## 2023-06-19 DIAGNOSIS — I10 Essential (primary) hypertension: Secondary | ICD-10-CM | POA: Diagnosis not present

## 2023-06-19 DIAGNOSIS — E66811 Obesity, class 1: Secondary | ICD-10-CM

## 2023-06-19 DIAGNOSIS — E669 Obesity, unspecified: Secondary | ICD-10-CM | POA: Diagnosis not present

## 2023-06-19 LAB — POC COVID19 BINAXNOW: SARS Coronavirus 2 Ag: NEGATIVE

## 2023-06-19 MED ORDER — TIOTROPIUM BROMIDE MONOHYDRATE 18 MCG IN CAPS
18.0000 ug | ORAL_CAPSULE | Freq: Every day | RESPIRATORY_TRACT | 2 refills | Status: DC
Start: 2023-06-19 — End: 2023-06-19

## 2023-06-19 MED ORDER — DEXAMETHASONE 4 MG PO TABS
ORAL_TABLET | ORAL | 0 refills | Status: DC
Start: 2023-06-19 — End: 2023-07-04

## 2023-06-19 MED ORDER — MONTELUKAST SODIUM 10 MG PO TABS
10.0000 mg | ORAL_TABLET | Freq: Every day | ORAL | 2 refills | Status: DC
Start: 2023-06-19 — End: 2023-07-13

## 2023-06-19 MED ORDER — UMECLIDINIUM BROMIDE 62.5 MCG/ACT IN AEPB
1.0000 | INHALATION_SPRAY | Freq: Every day | RESPIRATORY_TRACT | 3 refills | Status: DC
Start: 2023-06-19 — End: 2023-07-04

## 2023-06-19 NOTE — Addendum Note (Signed)
Addended by: Adria Dill on: 06/19/2023 11:18 AM   Modules accepted: Orders

## 2023-06-19 NOTE — Progress Notes (Signed)
Assessment and Plan: Rebecca Ochoa was seen today for cough.  Diagnoses and all orders for this visit:  Essential hypertension - continue medications, DASH diet, exercise and monitor at home. Call if greater than 130/80.   Obesity (BMI 30.0-34.9) Long discussion about weight loss, diet, and exercise Recommended diet heavy in fruits and veggies and low in animal meats, cheeses, and dairy products, appropriate calorie intake Patient will work on decreasing saturated fats and simple carbs Follow up at next visit  Asthma due to environmental allergies Use spiriva 1 inhalation daily- take every day Use Zyrtec and Flonase daily Start monleukast 1 pill daily at bedtime Complete dexamethasone taper -     montelukast (SINGULAIR) 10 MG tablet; Take 1 tablet (10 mg total) by mouth daily. -     dexamethasone (DECADRON) 4 MG tablet; Take 3 tabs for 3 days, 2 tabs for 3 days 1 tab for 5 days. Take with food. -     tiotropium (SPIRIVA HANDIHALER) 18 MCG inhalation capsule; Place 1 capsule (18 mcg total) into inhaler and inhale daily.  Encounter for screening for COVID-19 [Z11.52] Negative      Further disposition pending results of labs. Discussed med's effects and SE's.   Over 30 minutes of exam, counseling, chart review, and critical decision making was performed.   Future Appointments  Date Time Provider Department Center  10/16/2023  2:00 PM Raynelle Dick, NP [facility] None    ------------------------------------------------------------------------------------------------------------------   HPI BP 102/60   Pulse 92   Temp 97.9 F (36.6 C)   Ht 5' 4.5" (1.638 m)   Wt 178 lb 3.2 oz (80.8 kg)   SpO2 96%   BMI 30.12 kg/m   62 y.o.female presents for evaluation of dry cough, body aches, headaches, fatigue which has been present x 2-3 months.  Initially 04/27/23 was treated for seasonal allergic rhinitis with Zyrtec and Flonase.  Then 05/30/23 for acute URI with Z pack, dexamethasone  taper and albuterol inhaler as needed. Body aches and headaches started yesterday. She has been using zyrtec and flonase but not consistently, uses albuterol inhaler as needed.    BP well controlled with Hyzaar 50/12.5 mg every day  BP Readings from Last 3 Encounters:  06/19/23 102/60  05/30/23 138/72  04/27/23 (!) 148/62  Denies headaches, chest pain, shortness of breath and dizziness    BMI is Body mass index is 30.12 kg/m., she has been working on diet and exercise. Wt Readings from Last 3 Encounters:  06/19/23 178 lb 3.2 oz (80.8 kg)  05/30/23 176 lb 12.8 oz (80.2 kg)  04/27/23 178 lb 9.6 oz (81 kg)    Past Medical History:  Diagnosis Date   Anemia    Asthma, mild intermittent, well-controlled    GERD (gastroesophageal reflux disease)    Hx of migraines    Hyperlipidemia    Vitamin D deficiency      Allergies  Allergen Reactions   Biaxin [Clarithromycin] Nausea Only    Current Outpatient Medications on File Prior to Visit  Medication Sig   albuterol (VENTOLIN HFA) 108 (90 Base) MCG/ACT inhaler Inhale 2 puffs into the lungs every 6 (six) hours as needed for wheezing or shortness of breath.   ALPRAZolam (XANAX) 0.5 MG tablet Take  1/2 to 1 tablet  at bedtime   ONLY  if needed for  Sleep & please try to limit to 5 days /week to avoid Addiction & Dementia. Attempting to wean all patients off benzodiazepines   Fluticasone Propionate (FLONASE NA) Place  into the nose.   losartan-hydrochlorothiazide (HYZAAR) 50-12.5 MG tablet Take 1 tablet by mouth daily.   meloxicam (MOBIC) 15 MG tablet Take 1/2 to 1 tablet  Daily  with Food  for Pain & Inflammation   [DISCONTINUED] bisoprolol-hydrochlorothiazide (ZIAC) 5-6.25 MG tablet Take  1 tablet  Daily  for BP   No current facility-administered medications on file prior to visit.    ROS: all negative except above.   Physical Exam:  BP 102/60   Pulse 92   Temp 97.9 F (36.6 C)   Ht 5' 4.5" (1.638 m)   Wt 178 lb 3.2 oz (80.8  kg)   SpO2 96%   BMI 30.12 kg/m   General Appearance: Well nourished, in no apparent distress. Eyes: PERRLA, EOMs, conjunctiva no swelling or erythema Sinuses: No Frontal/maxillary tenderness ENT/Mouth: Ext aud canals clear, TMs without erythema, bulging. No erythema, swelling, or exudate on post pharynx.  Tonsils not swollen or erythematous. Hearing normal.  Neck: Supple, thyroid normal.  Respiratory: Respiratory effort normal, BS equal bilaterally with expiratory wheezes in trachea /upper airway Cardio: RRR with no MRGs. Brisk peripheral pulses without edema.  Abdomen: Soft, + BS.  Non tender, no guarding, rebound, hernias, masses. Lymphatics: Non tender without lymphadenopathy.  Musculoskeletal: Full ROM, 5/5 strength, normal gait.  Skin: Warm, dry without rashes, lesions, ecchymosis.  Neuro: Cranial nerves intact. Normal muscle tone, no cerebellar symptoms. Sensation intact.  Psych: Awake and oriented X 3, normal affect, Insight and Judgment appropriate.     Raynelle Dick, NP 10:28 AM [facility]

## 2023-06-19 NOTE — Patient Instructions (Signed)
Use spiriva 1 inhalation daily- take every day Use Zyrtec and Flonase daily Start monleukast 1 pill daily at bedtime Complete dexamethasone taper  Asthma, Adult  Asthma is a condition that causes swelling and narrowing of the airways. These are the passages that lead from the nose and mouth down into the lungs. When asthma symptoms get worse it is called an asthma attack or flare. This can make it hard to breathe. Asthma flares can range from minor to life-threatening. There is no cure for asthma, but medicines and lifestyle changes can help to control it. What are the causes? It is not known exactly what causes asthma, but certain things can cause asthma symptoms to get worse (triggers). What can trigger an asthma attack? Cigarette smoke. Mold. Dust. Your pet's skin flakes (dander). Cockroaches. Pollen. Air pollution (like household cleaners, wood smoke, smog, or Therapist, occupational). What are the signs or symptoms? Trouble breathing (shortness of breath). Coughing. Making high-pitched whistling sounds when you breathe, most often when you breathe out (wheezing). Chest tightness. Tiredness with little activity. Poor exercise tolerance. How is this treated? Controller medicines that help prevent asthma symptoms. Fast-acting reliever or rescue medicines. These give short-term relief of asthma symptoms. Allergy medicines if your attacks are brought on by allergens. Medicines to help control the body's defense (immune) system. Staying away from the things that cause asthma attacks. Follow these instructions at home: Avoiding triggers in your home Do not allow anyone to smoke in your home. Limit use of fireplaces and wood stoves. Get rid of pests (such as roaches and mice) and their droppings. Keep your home clean. Clean your floors. Dust regularly. Use cleaning products that do not smell. Wash bed sheets and blankets every week in hot water. Dry them in a dryer. Have someone vacuum  when you are not home. Change your heating and air conditioning filters often. Use blankets that are made of polyester or cotton. General instructions Take over-the-counter and prescription medicines only as told by your doctor. Do not smoke or use any products that contain nicotine or tobacco. If you need help quitting, ask your doctor. Stay away from secondhand smoke. Avoid doing things outdoors when allergen counts are high and when air quality is low. Warm up before you exercise. Take time to cool down after exercise. Use a peak flow meter as told by your doctor. A peak flow meter is a tool that measures how well your lungs are working. Keep track of the peak flow meter's readings. Write them down. Follow your asthma action plan. This is a written plan for taking care of your asthma and treating your attacks. Make sure you get all the shots (vaccines) that your doctor recommends. Ask your doctor about a flu shot and a pneumonia shot. Keep all follow-up visits. Contact a doctor if: You have wheezing, shortness of breath, or a cough even while taking medicine to prevent attacks. The mucus you cough up (sputum) is thicker than usual. The mucus you cough up changes from clear or white to yellow, green, gray, or is bloody. You have problems from the medicine you are taking, such as: A rash. Itching. Swelling. Trouble breathing. You need reliever medicines more than 2-3 times a week. Your peak flow reading is still at 50-79% of your personal best after following the action plan for 1 hour. You have a fever. Get help right away if: You seem to be worse and are not responding to medicine during an asthma attack. You are short of  breath even at rest. You get short of breath when doing very little activity. You have trouble eating, drinking, or talking. You have chest pain or tightness. You have a fast heartbeat. Your lips or fingernails start to turn blue. You are light-headed or dizzy,  or you faint. Your peak flow is less than 50% of your personal best. You feel too tired to breathe normally. These symptoms may be an emergency. Get help right away. Call 911. Do not wait to see if the symptoms will go away. Do not drive yourself to the hospital. Summary Asthma is a long-term (chronic) condition in which the airways get tight and narrow. An asthma attack can make it hard to breathe. Asthma cannot be cured, but medicines and lifestyle changes can help control it. Make sure you understand how to avoid triggers and how and when to use your medicines. Avoid things that can cause allergy symptoms (allergens). These include animal skin flakes (dander) and pollen from trees or grass. Avoid things that pollute the air. These may include household cleaners, wood smoke, smog, or chemical odors. This information is not intended to replace advice given to you by your health care provider. Make sure you discuss any questions you have with your health care provider. Document Revised: 07/26/2021 Document Reviewed: 07/26/2021 Elsevier Patient Education  2024 [employer].

## 2023-07-04 ENCOUNTER — Encounter: Payer: Self-pay | Admitting: Nurse Practitioner

## 2023-07-04 ENCOUNTER — Ambulatory Visit (INDEPENDENT_AMBULATORY_CARE_PROVIDER_SITE_OTHER): Payer: No Typology Code available for payment source | Admitting: Nurse Practitioner

## 2023-07-04 VITALS — BP 132/68 | HR 88 | Temp 97.7°F | Ht 64.5 in | Wt 178.8 lb

## 2023-07-04 DIAGNOSIS — I10 Essential (primary) hypertension: Secondary | ICD-10-CM

## 2023-07-04 DIAGNOSIS — M25561 Pain in right knee: Secondary | ICD-10-CM

## 2023-07-04 DIAGNOSIS — M170 Bilateral primary osteoarthritis of knee: Secondary | ICD-10-CM

## 2023-07-04 MED ORDER — LIDOCAINE 5 % EX PTCH
1.0000 | MEDICATED_PATCH | Freq: Two times a day (BID) | CUTANEOUS | 0 refills | Status: DC
Start: 2023-07-04 — End: 2023-09-07

## 2023-07-04 MED ORDER — PREDNISONE 20 MG PO TABS
ORAL_TABLET | ORAL | 0 refills | Status: AC
Start: 2023-07-04 — End: 2023-07-15

## 2023-07-04 MED ORDER — MELOXICAM 15 MG PO TABS
ORAL_TABLET | ORAL | 3 refills | Status: DC
Start: 2023-07-04 — End: 2023-09-07

## 2023-07-04 NOTE — Patient Instructions (Signed)
 Acute Knee Pain, Adult Many things can cause knee pain. Sometimes, knee pain is sudden (acute). It may be caused by damage, swelling, or irritation of the muscles and tissues that support your knee. Pain may come from: A fall. An injury to the knee from twisting motions. A hit to the knee. Infection. The pain often goes away on its own with time and rest. If the pain does not go away, tests may be done to find out what is causing the pain. These may include: Imaging tests, such as an X-ray, MRI, CT scan, or ultrasound. Joint aspiration. In this test, fluid is removed from the knee and checked. Arthroscopy. In this test, a lighted tube is put in the knee and an image is shown on a screen. A biopsy. In this test, a health care provider will remove a small piece of tissue for testing. Follow these instructions at home: If you have a knee sleeve or brace that can be taken off:  Wear the knee sleeve or brace as told by your provider. Take it off only if your provider says that you can. Check the skin around it every day. Tell your provider if you see problems. Loosen the knee sleeve or brace if your toes tingle, are numb, or turn cold and blue. Keep the knee sleeve or brace clean and dry. Bathing If the knee sleeve or brace is not waterproof: Do not let it get wet. Cover it when you take a bath or shower. Use a cover that does not let any water in. Managing pain, stiffness, and swelling  If told, put ice on the area. If you have a knee sleeve or brace that you can take off, remove it as told. Put ice in a plastic bag. Place a towel between your skin and the bag. Leave the ice on for 20 minutes, 2-3 times a day. If your skin turns bright red, take off the ice right away to prevent skin damage. The risk of damage is higher if you cannot feel pain, heat, or cold. Move your toes often to reduce stiffness and swelling. Raise the injured area above the level of your heart while you are sitting  or lying down. Use a pillow to support your foot as needed. If told, use an elastic bandage to put pressure (compression) on your injured knee. This may control swelling, give support, and help with discomfort. Sleep with a pillow under your knee. Activity Rest your knee. Do not do things that cause pain or make pain worse. Do not stand or walk on your injured knee until you're told it's okay. Use crutches as told. Avoid activities where both feet leave the ground at the same time and put stress on the joints. Avoid running, jumping rope, and doing jumping jacks. Work with a physical therapist to make a safe exercise program if told. Physical therapy helps your knee move better and get stronger. Exercise as told. General instructions Take your medicines only as told by your provider. If you are overweight, work with your provider and an expert in healthy eating, called a dietician, to set goals to lose weight. Being overweight can make your knee hurt more. Do not smoke, vape, or use products with nicotine or tobacco in them. If you need help quitting, talk with your provider. Return to normal activities when you are told. Ask what things are safe for you to do. Watch for any changes in your symptoms. Keep all follow-up visits. Your provider will check  your healing and adjust treatments if needed. Contact a health care provider if: The knee pain does not stop. The knee pain changes or gets worse. You have a fever along with knee pain. Your knee is red or feels warm when you touch it. Your knee gives out or locks up. Get help right away if: Your knee swells and the swelling gets worse. You cannot move your knee. You have very bad knee pain that does not get better with medicine. This information is not intended to replace advice given to you by your health care provider. Make sure you discuss any questions you have with your health care provider. Document Revised: 12/12/2022 Document  Reviewed: 12/12/2022 Elsevier Patient Education  2024 [employer].

## 2023-07-04 NOTE — Progress Notes (Signed)
Assessment and Plan:  Rebecca Ochoa was seen today for knee pain.  Diagnoses and all orders for this visit:  Essential hypertension - continue medications, DASH diet, exercise and monitor at home. Call if greater than 130/80.   Acute pain of right knee Ice, brace elevate as much as possible Ues lidoderm patch PRN and Meloxicam once a day for next 7 days Prednisone taper Will treat further pending xray results If becomes swollen red, hot to touch or increased pain go to ER.  -     DG Knee Complete 4 Views Right; Future -     predniSONE (DELTASONE) 20 MG tablet; 3 tablets daily with food for 3 days, 2 tabs daily for 3 days, 1 tab a day for 5 days. -     lidocaine (LIDODERM) 5 %; Place 1 patch onto the skin every 12 (twelve) hours. Remove & Discard patch within 12 hours or as directed by MD  Osteoarthritis of both knees, unspecified osteoarthritis type -     meloxicam (MOBIC) 15 MG tablet; Take 1/2 to 1 tablet  Daily  with Food  for Pain & Inflammation       Further disposition pending results of labs. Discussed med's effects and SE's.   Over 30 minutes of exam, counseling, chart review, and critical decision making was performed.   Future Appointments  Date Time Provider Department [facility]  10/16/2023  2:00 PM Raynelle Dick, NP [facility] None    ------------------------------------------------------------------------------------------------------------------   HPI BP 132/68   Pulse 88   Temp 97.7 F (36.5 C)   Ht 5' 4.5" (1.638 m)   Wt 178 lb 12.8 oz (81.1 kg)   SpO2 97%   BMI 30.22 kg/m  Rebecca Ochoa presents for right knee pain that started Saturday night.  Denies any injuries. Describes pain as throbbing aching.  Went to bed Saturday night and pain started spontaneously. Describes as 10/10 on pain scale.    BP well controlled with Ziac 5/6.25 mg every day and Hyzaar 50/12.5 mg every day  BP Readings from Last 3 Encounters:  07/04/23 132/68  06/19/23 102/60   05/30/23 138/72  Denies headaches, chest pain, shortness of breath and dizziness   BMI is Body mass index is 30.22 kg/m., she has been working on diet and exercise. Wt Readings from Last 3 Encounters:  07/04/23 178 lb 12.8 oz (81.1 kg)  06/19/23 178 lb 3.2 oz (80.8 kg)  05/30/23 176 lb 12.8 oz (80.2 kg)    Past Medical History:  Diagnosis Date   Anemia    Asthma, mild intermittent, well-controlled    GERD (gastroesophageal reflux disease)    Hx of migraines    Hyperlipidemia    Vitamin D deficiency      Allergies  Allergen Reactions   Biaxin [Clarithromycin] Nausea Only    Current Outpatient Medications on File Prior to Visit  Medication Sig   albuterol (VENTOLIN HFA) 108 (90 Base) MCG/ACT inhaler Inhale 2 puffs into the lungs every 6 (six) hours as needed for wheezing or shortness of breath.   ALPRAZolam (XANAX) 0.5 MG tablet Take  1/2 to 1 tablet  at bedtime   ONLY  if needed for  Sleep & please try to limit to 5 days /week to avoid Addiction & Dementia. Attempting to wean all patients off benzodiazepines   cetirizine (ZYRTEC) 10 MG tablet Take 10 mg by mouth daily.   Fluticasone Propionate (FLONASE NA) Place into the nose.   losartan-hydrochlorothiazide (HYZAAR) 50-12.5 MG tablet Take 1 tablet  by mouth daily.   montelukast (SINGULAIR) 10 MG tablet Take 1 tablet (10 mg total) by mouth daily. (Patient not taking: Reported on 07/04/2023)   umeclidinium bromide (INCRUSE ELLIPTA) Rebecca.5 MCG/ACT AEPB Inhale 1 puff into the lungs daily. (Patient not taking: Reported on 07/04/2023)   [DISCONTINUED] bisoprolol-hydrochlorothiazide (ZIAC) 5-6.25 MG tablet Take  1 tablet  Daily  for BP   No current facility-administered medications on file prior to visit.    ROS: all negative except above.   Physical Exam:  BP 132/68   Pulse 88   Temp 97.7 F (36.5 C)   Ht 5' 4.5" (1.638 m)   Wt 178 lb 12.8 oz (81.1 kg)   SpO2 97%   BMI 30.22 kg/m   General Appearance: Well nourished, in no  apparent distress. Eyes: PERRLA, EOMs, conjunctiva no swelling or erythema Neck: Supple, thyroid normal.  Respiratory: Respiratory effort normal, BS equal bilaterally without rales, rhonchi, wheezing or stridor.  Cardio: RRR with no MRGs. Brisk peripheral pulses without edema.  Abdomen: Soft, + BS.  Non tender, no guarding, rebound, hernias, masses. Musculoskeletal: Full ROM, 5/5 strength, antalgic gait. Pain with pressure to medial femur above right knee- no swelling or redness Skin: Warm, dry without rashes, lesions, ecchymosis.  Neuro: Cranial nerves intact. Normal muscle tone, no cerebellar symptoms. Sensation intact.  Psych: Awake and oriented X 3, normal affect, Insight and Judgment appropriate.     Raynelle Dick, NP 4:08 PM [facility]

## 2023-07-05 ENCOUNTER — Ambulatory Visit
Admission: RE | Admit: 2023-07-05 | Discharge: 2023-07-05 | Disposition: A | Discharge status: Home / Self Care | Payer: No Typology Code available for payment source | Source: Ambulatory Visit | Attending: Nurse Practitioner | Admitting: Nurse Practitioner

## 2023-07-05 DIAGNOSIS — M25561 Pain in right knee: Secondary | ICD-10-CM

## 2023-07-10 ENCOUNTER — Other Ambulatory Visit: Payer: Self-pay | Admitting: Nurse Practitioner

## 2023-07-10 DIAGNOSIS — I10 Essential (primary) hypertension: Secondary | ICD-10-CM

## 2023-07-13 ENCOUNTER — Other Ambulatory Visit: Payer: Self-pay | Admitting: Nurse Practitioner

## 2023-07-13 DIAGNOSIS — J45909 Unspecified asthma, uncomplicated: Secondary | ICD-10-CM

## 2023-07-13 DIAGNOSIS — F419 Anxiety disorder, unspecified: Secondary | ICD-10-CM

## 2023-07-14 MED ORDER — ALPRAZOLAM 0.5 MG PO TABS
ORAL_TABLET | ORAL | 0 refills | Status: AC
Start: 2023-07-14 — End: ?

## 2023-07-21 ENCOUNTER — Ambulatory Visit
Admission: EM | Admit: 2023-07-21 | Discharge: 2023-07-21 | Disposition: A | Discharge status: Home / Self Care | Payer: No Typology Code available for payment source | Attending: Internal Medicine | Admitting: Internal Medicine

## 2023-07-21 ENCOUNTER — Other Ambulatory Visit: Payer: Self-pay

## 2023-07-21 ENCOUNTER — Emergency Department (HOSPITAL_COMMUNITY): Payer: No Typology Code available for payment source

## 2023-07-21 ENCOUNTER — Emergency Department (HOSPITAL_COMMUNITY)
Admission: EM | Admit: 2023-07-21 | Discharge: 2023-07-21 | Disposition: A | Discharge status: Home / Self Care | Payer: No Typology Code available for payment source | Attending: Emergency Medicine | Admitting: Emergency Medicine

## 2023-07-21 ENCOUNTER — Telehealth: Payer: Self-pay | Admitting: Nurse Practitioner

## 2023-07-21 ENCOUNTER — Ambulatory Visit: Payer: No Typology Code available for payment source

## 2023-07-21 DIAGNOSIS — R9389 Abnormal findings on diagnostic imaging of other specified body structures: Secondary | ICD-10-CM

## 2023-07-21 DIAGNOSIS — R079 Chest pain, unspecified: Secondary | ICD-10-CM

## 2023-07-21 DIAGNOSIS — R0789 Other chest pain: Secondary | ICD-10-CM

## 2023-07-21 DIAGNOSIS — R918 Other nonspecific abnormal finding of lung field: Secondary | ICD-10-CM

## 2023-07-21 DIAGNOSIS — R911 Solitary pulmonary nodule: Secondary | ICD-10-CM | POA: Diagnosis present

## 2023-07-21 DIAGNOSIS — J45909 Unspecified asthma, uncomplicated: Secondary | ICD-10-CM | POA: Diagnosis not present

## 2023-07-21 DIAGNOSIS — Z7951 Long term (current) use of inhaled steroids: Secondary | ICD-10-CM | POA: Insufficient documentation

## 2023-07-21 LAB — BASIC METABOLIC PANEL
Anion gap: 10 (ref 5–15)
BUN: 5 mg/dL — ABNORMAL LOW (ref 8–23)
CO2: 26 mmol/L (ref 22–32)
Calcium: 9.2 mg/dL (ref 8.9–10.3)
Chloride: 100 mmol/L (ref 98–111)
Creatinine, Ser: 0.79 mg/dL (ref 0.44–1.00)
GFR, Estimated: >60 mL/min (ref >60)
Glucose, Bld: 176 mg/dL — ABNORMAL HIGH (ref 70–99)
Potassium: 3.4 mmol/L — ABNORMAL LOW (ref 3.5–5.1)
Sodium: 136 mmol/L (ref 135–145)

## 2023-07-21 LAB — CBC
HCT: 33.5 % — ABNORMAL LOW (ref 36.0–46.0)
Hemoglobin: 10.9 g/dL — ABNORMAL LOW (ref 12.0–15.0)
MCH: 31.2 pg (ref 26.0–34.0)
MCHC: 32.5 g/dL (ref 30.0–36.0)
MCV: 96 fL (ref 80.0–100.0)
Platelets: 204 10*3/uL (ref 150–400)
RBC: 3.49 MIL/uL — ABNORMAL LOW (ref 3.87–5.11)
RDW: 14.2 % (ref 11.5–15.5)
WBC: 8.7 10*3/uL (ref 4.0–10.5)
nRBC: 0 % (ref 0.0–0.2)

## 2023-07-21 LAB — TROPONIN I (HIGH SENSITIVITY)
Troponin I (High Sensitivity): 3 ng/L (ref <18)
Troponin I (High Sensitivity): 3 ng/L (ref <18)

## 2023-07-21 MED ORDER — IOHEXOL 300 MG/ML  SOLN
75.0000 mL | Freq: Once | INTRAMUSCULAR | Status: AC | PRN
  Administered 2023-07-21: 75 mL via INTRAVENOUS

## 2023-07-21 MED ORDER — HYDROCODONE-ACETAMINOPHEN 5-325 MG PO TABS
1.0000 | ORAL_TABLET | Freq: Four times a day (QID) | ORAL | 0 refills | Status: DC | PRN
Start: 2023-07-21 — End: 2023-09-07

## 2023-07-21 NOTE — Telephone Encounter (Signed)
Regarding last message, it was a chest X-ray,not a CT scan. Sorry.

## 2023-07-21 NOTE — Telephone Encounter (Signed)
Pt went to ER. She had a CT scan done, and there were white spots the ER was concerned about. Pt does not want to move forward without your approval. She would like to speak with you/Pam.

## 2023-07-21 NOTE — ED Triage Notes (Signed)
Patient here today with c/o chest soreness every time she breathes in or bends over since yesterday morning at around 4 am. Patient states that the pain radiates up to her neck. She has also been feeling fatigue. Patient states that she started feeling somewhat better about an ago. She takes Hyzaar for HTN.

## 2023-07-21 NOTE — ED Provider Triage Note (Signed)
Emergency Medicine Provider Triage Evaluation Note  Rebecca Ochoa , a 62 y.o. female  was evaluated in triage.  Pt complains of Abnormal chest xray.  Review of Systems  Positive:  Negative:   Physical Exam  BP 116/68 (BP Location: Right Arm)   Pulse (!) 113   Temp 98.2 F (36.8 C) (Oral)   Resp 16   SpO2 99%  Gen:   Awake, no distress   Resp:  Normal effort  MSK:   Moves extremities without difficulty  Other:    Medical Decision Making  Medically screening exam initiated at 2:45 PM.  Appropriate orders placed.  Rebecca Ochoa was informed that the remainder of the evaluation will be completed by another provider, this initial triage assessment does not replace that evaluation, and the importance of remaining in the ED until their evaluation is complete.  Concerned for abnormal CT. Patient went to [facility] for pain with inspiration. Denies fever, chest pain, nausea, vomiting. Intermittent dry cough since July. Patient's chest x-ray is concerning for multifocal pneumonia versus malignancy. Patient has been smoking most of her life.  States that she had COVID one month ago.   Rebecca Ochoa F, [place] 07/21/23 [phone]

## 2023-07-21 NOTE — ED Provider Notes (Signed)
[place] EMERGENCY DEPARTMENT AT [facility] Provider Note   CSN: [id number] Arrival date & time: 07/21/23  1308     History  Chief Complaint  Patient presents with   abnormal chest x-ray   Chest Pain    Rebecca Ochoa is a 62 y.o. female with history of asthma, GERD, migraines, hyperlipidemia, who presents the emergency department from urgent care for abnormal chest x-ray.  Patient been having some chest tightness for few days, with worsening pain with inspiration starting early this morning.  Intermittent cough, is mostly resolved, had COVID a month ago.  Former smoker, currently vapes.   Chest Pain Associated symptoms: cough        Home Medications Prior to Admission medications   Medication Sig Start Date End Date Taking? Authorizing Provider  HYDROcodone-acetaminophen (NORCO/VICODIN) 5-325 MG tablet Take 1 tablet by mouth every 6 (six) hours as needed. 07/21/23  Yes Kemyah Buser T, PA-C  albuterol (VENTOLIN HFA) 108 (90 Base) MCG/ACT inhaler Inhale 2 puffs into the lungs every 6 (six) hours as needed for wheezing or shortness of breath. 05/30/23   Raynelle Dick, NP  ALPRAZolam Prudy Feeler) 0.5 MG tablet Take  1/2 to 1 tablet  at bedtime   ONLY  if needed for  Sleep & please try to limit to 5 days /week to avoid Addiction & Dementia. Attempting to wean all patients off benzodiazepines 07/14/23   Adela Glimpse, NP  Fluticasone Propionate (FLONASE NA) Place into the nose.    [provider]  lidocaine (LIDODERM) 5 % Place 1 patch onto the skin every 12 (twelve) hours. Remove & Discard patch within 12 hours or as directed by MD 07/04/23 07/03/24  Raynelle Dick, NP  losartan-hydrochlorothiazide (HYZAAR) 50-12.5 MG tablet TAKE 1 TABLET BY MOUTH DAILY 07/10/23 07/09/24  Raynelle Dick, NP  meloxicam (MOBIC) 15 MG tablet Take 1/2 to 1 tablet  Daily  with Food  for Pain & Inflammation 07/04/23   Raynelle Dick, NP  bisoprolol-hydrochlorothiazide [facility]) 5-6.25 MG  tablet Take  1 tablet  Daily  for BP 06/06/22 07/11/22  [name], MD      Allergies    Biaxin [clarithromycin]    Review of Systems   Review of Systems  Respiratory:  Positive for cough and chest tightness.   Cardiovascular:  Positive for chest pain.  All other systems reviewed and are negative.   Physical Exam Updated Vital Signs BP (!) 131/119 (BP Location: Right Arm)   Pulse 96   Temp 98.2 F (36.8 C)   Resp 20   SpO2 99%  Physical Exam Vitals and nursing note reviewed.  Constitutional:      Appearance: Normal appearance.  HENT:     Head: Normocephalic and atraumatic.  Eyes:     Conjunctiva/sclera: Conjunctivae normal.  Cardiovascular:     Rate and Rhythm: Normal rate and regular rhythm.  Pulmonary:     Effort: Pulmonary effort is normal. No respiratory distress.     Breath sounds: Wheezing present.     Comments: Inspiratory and expiratory wheezing in R lung fields, worse in upper. Expiratory wheezing in all lung fields. Abdominal:     General: There is no distension.     Palpations: Abdomen is soft.     Tenderness: There is no abdominal tenderness.  Skin:    General: Skin is warm and dry.  Neurological:     General: No focal deficit present.     Mental Status: She is alert.  ED Results / Procedures / Treatments   Labs (all labs ordered are listed, but only abnormal results are displayed) Labs Reviewed  BASIC METABOLIC PANEL - Abnormal; Notable for the following components:      Result Value   Potassium 3.4 (*)    Glucose, Bld 176 (*)    BUN 5 (*)    All other components within normal limits  CBC - Abnormal; Notable for the following components:   RBC 3.49 (*)    Hemoglobin 10.9 (*)    HCT 33.5 (*)    All other components within normal limits  TROPONIN I (HIGH SENSITIVITY)  TROPONIN I (HIGH SENSITIVITY)    EKG EKG Interpretation Date/Time:  Friday July 21 2023 13:51:55 EDT Ventricular Rate:  113 PR Interval:  148 QRS  Duration:  70 QT Interval:  313 QTC Calculation: 428 R Axis:   55  Text Interpretation: Sinus tachycardia Ventricular tachycardia, unsustained Aberrant complex LAE, consider biatrial enlargement Confirmed by Virgina Norfolk [phone]) on 07/21/2023 8:51:52 PM  Radiology CT Chest W Contrast  Result Date: 07/21/2023 CLINICAL DATA:  Chest soreness with breathing EXAM: CT CHEST WITH CONTRAST TECHNIQUE: Multidetector CT imaging of the chest was performed during intravenous contrast administration. RADIATION DOSE REDUCTION: This exam was performed according to the departmental dose-optimization program which includes automated exposure control, adjustment of the mA and/or kV according to patient size and/or use of iterative reconstruction technique. CONTRAST:  75mL OMNIPAQUE IOHEXOL 300 MG/ML  SOLN COMPARISON:  Chest x-ray 07/21/2023 FINDINGS: Cardiovascular: Nonaneurysmal aorta. Mild atherosclerosis. Normal cardiac size. No pericardial effusion Mediastinum/Nodes: Midline trachea. No thyroid mass. Subcarinal lymph node measuring 1.6 cm on series 2, image 23. Right hilar lobulated mass measuring 5.7 by 4.7 cm with vascular encasement of right pulmonary vessels. Esophagus within normal limits Lungs/Pleura: No pleural effusion or pneumothorax. Numerous bilateral pulmonary nodules consistent with metastatic disease. Largest on the left is seen at the anterior left lung base and measures 14 mm on series 4, image 100. Dominant right upper lobe lobulated and partially cavitary mass abutting the pleural surface, this measures 5.3 by 4 point 5 by 4.7 cm on series 4, image 32. Mild subpleural bandlike density in the posterior right lower lobe could be secondary to post inflammatory or infectious scarring. Upper Abdomen: Heterogeneous left hepatic lobe masses partially visualized, more superior mass measures 6.7 x 4.3 cm. There is background steatosis. Indeterminate hypodense splenic masses, for example 16 mm lesion series 2,  image 52 and 16 mm lesion series 2, image 57. right hepatic lobe vague lesion measuring 1.7 cm on series 2, image 50. Musculoskeletal: No acute osseous abnormality. IMPRESSION: 1. Numerous bilateral pulmonary nodules consistent with metastatic disease. 2. Dominant right apical partially cavitary mass abutting the pleural surface either representing primary lung neoplasm versus metastatic focus. Additional finding of lobulated right hilar mass with narrowing of pulmonary vessels, this could represent nodal conglomerate versus primary lung mass. 3. Hypodense liver masses favored to represent metastatic disease. There is however underlying steatosis 4. Several hypodense splenic lesions which are indeterminate and raise concern for potential metastatic disease. Aortic Atherosclerosis (ICD10-I70.0). Electronically Signed   By: Jasmine Pang M.D.   On: 07/21/2023 22:04   DG Chest 2 View  Result Date: 07/21/2023 CLINICAL DATA:  pain with inspiration EXAM: CHEST - 2 VIEW COMPARISON:  CXR 12/24/15 FINDINGS: No pleural effusion. No pneumothorax. Normal cardiac and mediastinal contours. New nodular lesions/masses at the right hilum, in the right upper lobe, and in the right mid lung field.  No radiographically apparent displaced rib fractures. Visualized upper abdomen is unremarkable. IMPRESSION: New nodular lesions/masses at the right hilum, in the right upper lobe, and in the right mid lung field. Recommend further evaluation with chest CT to confirm the possibility of metastatic disease. Electronically Signed   By: Lorenza Cambridge M.D.   On: 07/21/2023 10:49    Procedures Procedures    Medications Ordered in ED Medications  iohexol (OMNIPAQUE) 300 MG/ML solution 75 mL (75 mLs Intravenous Contrast Given 07/21/23 2019)    ED Course/ Medical Decision Making/ A&P                                 Medical Decision Making Amount and/or Complexity of Data Reviewed Labs: ordered.  Risk Prescription drug  management.   This patient is a 62 y.o. female  who presents to the ED for concern of chest pain, abnormal CXR from urgent care.   Differential diagnoses prior to evaluation: The emergent differential diagnosis includes, but is not limited to,  multifocal pneumonia, malignancy . This is not an exhaustive differential.   Past Medical History / Co-morbidities / Social History: asthma, GERD, migraines, hyperlipidemia  Additional history: Chart reviewed. Pertinent results include: CXR from UC this AM shows new nodular lesion/masses at the right hilum, right upper lobe, and right midlung field.  Recommended formal chest CT for possible metastatic disease.  Physical Exam: Physical exam performed. The pertinent findings include: Initially tachycardic, mildly hypertensive. Resting comfortably in exam bed. Inspiratory and expiratory wheezing on the R, upper > lower. Expiratory wheezing as well on the left.   Lab Tests/Imaging studies: I personally interpreted labs/imaging and the pertinent results include: No leukocytosis, globin 10.9, compared to 13.9 three months ago.  BMP with elevated glucose, otherwise unremarkable.  Normal troponin.  CT chest with numerous bilateral pulmonary nodules consistent with metastatic disease, concern for liver and splenic masses. I agree with the radiologist interpretation.  Cardiac monitoring: EKG obtained and interpreted by myself and attending physician which shows: Sinus tachycardia   Disposition: After consideration of the diagnostic results and the patients response to treatment, I feel that emergency department workup does not suggest an emergent condition requiring admission or immediate intervention beyond what has been performed at this time. The plan is: discharge to home with oncology referral for R lung mass. Offered cough medication, patient declined. Will send prescription for pain medication to help with chest pain. The patient is safe for discharge  and has been instructed to return immediately for worsening symptoms, change in symptoms or any other concerns.  I discussed this case with my attending physician Dr. Fredderick Phenix who cosigned this note including patient's presenting symptoms, physical exam, and planned diagnostics and interventions. Attending physician stated agreement with plan or made changes to plan which were implemented.   Final Clinical Impression(s) / ED Diagnoses Final diagnoses:  Pulmonary nodules/lesions, multiple  Mass of right lung  Chest pain in adult    Rx / DC Orders ED Discharge Orders          Ordered    Ambulatory referral to Hematology / Oncology       Comments: Your emergency department provider has referred you to see a hematology/oncology specialist. These are physicians who specialize in blood disorders and cancers, or findings concerning for cancer. You will receive a phone call from the [facility] Office to set up your appointment within 2 business days: [employer] operate  Mon - Fri, 8:00 a.m. to 5:00 p.m.; closed for federally recognized holidays. Please be sure your phone is not set to block numbers during this time.   07/21/23 2250    HYDROcodone-acetaminophen (NORCO/VICODIN) 5-325 MG tablet  Every 6 hours PRN        07/21/23 2253           Portions of this report may have been transcribed using voice recognition software. Every effort was made to ensure accuracy; however, inadvertent computerized transcription errors may be present.    Jeanella Flattery 07/21/23 2307    Rolan Bucco, MD 07/21/23 2328

## 2023-07-21 NOTE — Discharge Instructions (Signed)
Go straight to the emergency department as your x-ray appears abnormal and you need CT imaging.

## 2023-07-21 NOTE — Discharge Instructions (Addendum)
You were seen in the ER for chest pain.   Unfortunately the CT of your chest showed masses in your right lung, which are concerning for cancer.   You will need to follow up with the oncologist. I have placed the referral for you, and you should hear from them within 2 business days. If you do not hear from them within that time frame, I've attached the contact information for you to call.   I have prescribed some pain medication that you can take to help with your chest discomfort. You can take 1 tablet every 6 hours as needed for pain. You can take over the counter medications as needed for pain as well.   Continue to monitor how you're doing and return to the ER for new or worsening symptoms.

## 2023-07-21 NOTE — Telephone Encounter (Signed)
Rebecca Ochoa agreed that she needed to go to the ER and have the CT scan done. Spoke with patient and told her Rebecca Ochoa agreed and that she needed to have the CT scan done.

## 2023-07-21 NOTE — ED Notes (Signed)
Patient is resting comfortably. Denies any needs at this time. Advised radiology read still pending.

## 2023-07-21 NOTE — ED Provider Notes (Addendum)
[facility]    CSN: [id number] Arrival date & time: 07/21/23  0815      History   Chief Complaint Chief Complaint  Patient presents with   Chest Pain    HPI Rebecca Ochoa is a 62 y.o. female.   Patient presents today with pain with inspiration that started yesterday around 4 AM.  Patient states that it only occurs when she takes a deep breath and seems to radiate up to her neck.  Denies shortness of breath, headache, dizziness, nausea, vomiting.  Denies any recent long distance travel in a car or plane.  Patient states that she was dealing with a cough intermittently in August but took prednisone for this.  Cough is now simply intermittent but has basically resolved.  Denies any associated fever.  Denies any injury to the chest.  Patient also reports that she has been feeling very fatigued since this chest discomfort started.  Denies any history of cardiac problems.  Denies history of pulmonary problems. Patient reports she is a former smoker and currently vapes.   Chest Pain   Past Medical History:  Diagnosis Date   Anemia    Asthma, mild intermittent, well-controlled    GERD (gastroesophageal reflux disease)    Hx of migraines    Hyperlipidemia    Vitamin D deficiency     Patient Active Problem List   Diagnosis Date Noted   Other abnormal glucose 01/28/2018   Anxiety 01/28/2018   Asthma 05/31/2016   Tobacco dependence 12/01/2015   Noncompliance 12/01/2015   Hx of migraines    Hyperlipidemia    Anemia    Vitamin D deficiency     Past Surgical History:  Procedure Laterality Date   BREAST SURGERY     reduction   REDUCTION MAMMAPLASTY     TUBAL LIGATION      OB History   No obstetric history on file.      Home Medications    Prior to Admission medications   Medication Sig Start Date End Date Taking? Authorizing Provider  albuterol (VENTOLIN HFA) 108 (90 Base) MCG/ACT inhaler Inhale 2 puffs into the lungs every 6 (six) hours as needed for  wheezing or shortness of breath. 05/30/23  Yes Raynelle Dick, NP  ALPRAZolam Prudy Feeler) 0.5 MG tablet Take  1/2 to 1 tablet  at bedtime   ONLY  if needed for  Sleep & please try to limit to 5 days /week to avoid Addiction & Dementia. Attempting to wean all patients off benzodiazepines 07/14/23  Yes Cranford, Archie Patten, NP  losartan-hydrochlorothiazide (HYZAAR) 50-12.5 MG tablet TAKE 1 TABLET BY MOUTH DAILY 07/10/23 07/09/24 Yes Raynelle Dick, NP  meloxicam (MOBIC) 15 MG tablet Take 1/2 to 1 tablet  Daily  with Food  for Pain & Inflammation 07/04/23  Yes Raynelle Dick, NP  Fluticasone Propionate (FLONASE NA) Place into the nose.    [provider]  lidocaine (LIDODERM) 5 % Place 1 patch onto the skin every 12 (twelve) hours. Remove & Discard patch within 12 hours or as directed by MD 07/04/23 07/03/24  Raynelle Dick, NP  bisoprolol-hydrochlorothiazide [facility]) 5-6.25 MG tablet Take  1 tablet  Daily  for BP 06/06/22 07/11/22  [name], MD    Family History Family History  Problem Relation Age of Onset   Heart disease Father    Hypertension Father    Diabetes Father    Kidney disease Father     Social History Social History   Tobacco Use  Smoking status: Every Day    Types: E-cigarettes   Smokeless tobacco: Never  Vaping Use   Vaping status: Every Day  Substance Use Topics   Alcohol use: Yes    Alcohol/week: 3.0 standard drinks of alcohol    Types: 3 Standard drinks or equivalent per week   Drug use: Never     Allergies   Biaxin [clarithromycin]   Review of Systems Review of Systems Per HPI  Physical Exam Triage Vital Signs ED Triage Vitals  Encounter Vitals Group     BP 07/21/23 0849 125/75     Systolic BP Percentile --      Diastolic BP Percentile --      Pulse Rate 07/21/23 0849 94     Resp 07/21/23 0849 16     Temp 07/21/23 0849 98.3 F (36.8 C)     Temp Source 07/21/23 0849 Oral     SpO2 07/21/23 0849 97 %     Weight 07/21/23 0852 179 lb (81.2 kg)      Height 07/21/23 0852 5\' 5"  (1.651 m)     Head Circumference --      Peak Flow --      Pain Score 07/21/23 0857 2     Pain Loc --      Pain Education --      Exclude from Growth Chart --    No data found.  Updated Vital Signs BP 125/75 (BP Location: Left Arm)   Pulse 94   Temp 98.3 F (36.8 C) (Oral)   Resp 16   Ht 5\' 5"  (1.651 m)   Wt 179 lb (81.2 kg)   SpO2 97%   BMI 29.79 kg/m   Visual Acuity Right Eye Distance:   Left Eye Distance:   Bilateral Distance:    Right Eye Near:   Left Eye Near:    Bilateral Near:     Physical Exam Constitutional:      General: She is not in acute distress.    Appearance: Normal appearance. She is not toxic-appearing or diaphoretic.  HENT:     Head: Normocephalic and atraumatic.  Eyes:     Extraocular Movements: Extraocular movements intact.     Conjunctiva/sclera: Conjunctivae normal.  Cardiovascular:     Rate and Rhythm: Normal rate and regular rhythm.     Pulses: Normal pulses.     Heart sounds: Normal heart sounds.  Pulmonary:     Effort: Pulmonary effort is normal. No respiratory distress.     Breath sounds: Normal breath sounds. No stridor. No wheezing, rhonchi or rales.  Neurological:     General: No focal deficit present.     Mental Status: She is alert and oriented to person, place, and time. Mental status is at baseline.     Cranial Nerves: Cranial nerves 2-12 are intact.     Sensory: Sensation is intact.     Motor: Motor function is intact.     Coordination: Coordination is intact.     Gait: Gait is intact.  Psychiatric:        Mood and Affect: Mood normal.        Behavior: Behavior normal.        Thought Content: Thought content normal.        Judgment: Judgment normal.      UC Treatments / Results  Labs (all labs ordered are listed, but only abnormal results are displayed) Labs Reviewed - No data to display  EKG   Radiology DG Chest 2 View  Result Date: 07/21/2023 CLINICAL DATA:  pain with  inspiration EXAM: CHEST - 2 VIEW COMPARISON:  CXR 12/24/15 FINDINGS: No pleural effusion. No pneumothorax. Normal cardiac and mediastinal contours. New nodular lesions/masses at the right hilum, in the right upper lobe, and in the right mid lung field. No radiographically apparent displaced rib fractures. Visualized upper abdomen is unremarkable. IMPRESSION: New nodular lesions/masses at the right hilum, in the right upper lobe, and in the right mid lung field. Recommend further evaluation with chest CT to confirm the possibility of metastatic disease. Electronically Signed   By: Lorenza Cambridge M.D.   On: 07/21/2023 10:49    Procedures Procedures (including critical care time)  Medications Ordered in UC Medications - No data to display  Initial Impression / Assessment and Plan / UC Course  I have reviewed the triage vital signs and the nursing notes.  Pertinent labs & imaging results that were available during my care of the patient were reviewed by me and considered in my medical decision making (see chart for details).     Discussed patient's x-ray with supervising physician, Dr. Leonides Grills. Patient's chest x-ray is concerning for multifocal pneumonia versus malignancy so she was encouraged to go to the emergency department as she needs CT imaging to determine exact etiology.  Patient was agreeable with this plan.  Vital signs and patient stable at discharge.  Agree with patient self transport to the ER. Final Clinical Impressions(s) / UC Diagnoses   Final diagnoses:  Abnormal chest x-ray     Discharge Instructions      Go straight to the emergency department as your x-ray appears abnormal and you need CT imaging.     ED Prescriptions   None    PDMP not reviewed this encounter.   Gustavus Bryant, [place] 07/21/23 1049    [address] [place] 07/21/23 1053

## 2023-07-21 NOTE — ED Triage Notes (Signed)
Pt arrived via POV. Sent over from UC d/t abnormal x-ray results. Pt has been having some tightness in their chest  for several days.  AOx4

## 2023-07-31 ENCOUNTER — Other Ambulatory Visit: Payer: Self-pay | Admitting: Medical Oncology

## 2023-07-31 ENCOUNTER — Inpatient Hospital Stay: Payer: No Typology Code available for payment source | Attending: Internal Medicine | Admitting: Internal Medicine

## 2023-07-31 ENCOUNTER — Inpatient Hospital Stay: Payer: No Typology Code available for payment source

## 2023-07-31 VITALS — BP 119/71 | HR 107 | Temp 97.9°F | Resp 17 | Ht 65.0 in | Wt 179.4 lb

## 2023-07-31 DIAGNOSIS — R918 Other nonspecific abnormal finding of lung field: Secondary | ICD-10-CM

## 2023-07-31 DIAGNOSIS — F17210 Nicotine dependence, cigarettes, uncomplicated: Secondary | ICD-10-CM | POA: Insufficient documentation

## 2023-07-31 DIAGNOSIS — Z79899 Other long term (current) drug therapy: Secondary | ICD-10-CM | POA: Diagnosis not present

## 2023-07-31 DIAGNOSIS — K219 Gastro-esophageal reflux disease without esophagitis: Secondary | ICD-10-CM | POA: Insufficient documentation

## 2023-07-31 DIAGNOSIS — I10 Essential (primary) hypertension: Secondary | ICD-10-CM | POA: Insufficient documentation

## 2023-07-31 DIAGNOSIS — E559 Vitamin D deficiency, unspecified: Secondary | ICD-10-CM | POA: Diagnosis not present

## 2023-07-31 DIAGNOSIS — J4520 Mild intermittent asthma, uncomplicated: Secondary | ICD-10-CM | POA: Diagnosis not present

## 2023-07-31 DIAGNOSIS — D649 Anemia, unspecified: Secondary | ICD-10-CM | POA: Insufficient documentation

## 2023-07-31 DIAGNOSIS — C3490 Malignant neoplasm of unspecified part of unspecified bronchus or lung: Secondary | ICD-10-CM | POA: Diagnosis not present

## 2023-07-31 LAB — CBC WITH DIFFERENTIAL (CANCER CENTER ONLY)
Abs Immature Granulocytes: 0.03 10*3/uL (ref 0.00–0.07)
Basophils Absolute: 0 10*3/uL (ref 0.0–0.1)
Basophils Relative: 0 %
Eosinophils Absolute: 0.1 10*3/uL (ref 0.0–0.5)
Eosinophils Relative: 1 %
HCT: 34.1 % — ABNORMAL LOW (ref 36.0–46.0)
Hemoglobin: 11.3 g/dL — ABNORMAL LOW (ref 12.0–15.0)
Immature Granulocytes: 0 %
Lymphocytes Relative: 12 %
Lymphs Abs: 1.1 10*3/uL (ref 0.7–4.0)
MCH: 31.5 pg (ref 26.0–34.0)
MCHC: 33.1 g/dL (ref 30.0–36.0)
MCV: 95 fL (ref 80.0–100.0)
Monocytes Absolute: 0.5 10*3/uL (ref 0.1–1.0)
Monocytes Relative: 5 %
Neutro Abs: 7.3 10*3/uL (ref 1.7–7.7)
Neutrophils Relative %: 82 %
Platelet Count: 301 10*3/uL (ref 150–400)
RBC: 3.59 MIL/uL — ABNORMAL LOW (ref 3.87–5.11)
RDW: 14.8 % (ref 11.5–15.5)
WBC Count: 8.9 10*3/uL (ref 4.0–10.5)
nRBC: 0 % (ref 0.0–0.2)

## 2023-07-31 LAB — CMP (CANCER CENTER ONLY)
ALT: 19 U/L (ref 0–44)
AST: 32 U/L (ref 15–41)
Albumin: 4 g/dL (ref 3.5–5.0)
Alkaline Phosphatase: 107 U/L (ref 38–126)
Anion gap: 6 (ref 5–15)
BUN: 6 mg/dL — ABNORMAL LOW (ref 8–23)
CO2: 31 mmol/L (ref 22–32)
Calcium: 9.9 mg/dL (ref 8.9–10.3)
Chloride: 100 mmol/L (ref 98–111)
Creatinine: 0.75 mg/dL (ref 0.44–1.00)
GFR, Estimated: >60 mL/min (ref >60)
Glucose, Bld: 120 mg/dL — ABNORMAL HIGH (ref 70–99)
Potassium: 3.5 mmol/L (ref 3.5–5.1)
Sodium: 137 mmol/L (ref 135–145)
Total Bilirubin: 0.5 mg/dL (ref 0.3–1.2)
Total Protein: 7.4 g/dL (ref 6.5–8.1)

## 2023-07-31 MED ORDER — LORAZEPAM 0.5 MG PO TABS: ORAL_TABLET | ORAL | 0 refills | Status: DC

## 2023-07-31 NOTE — Progress Notes (Signed)
[facility] [phone]   [phone]  CONSULT NOTE  REFERRING PHYSICIAN: Valrie Hart, PA-C  REASON FOR CONSULTATION:  62 years old white female with bilateral pulmonary nodules  HPI Rebecca Ochoa is a 62 y.o. female with past medical history significant for anemia, asthma, GERD, history of migraine, dyslipidemia and vitamin D deficiency as well as breast reduction surgery.  The patient presented to the emergency department on 07/21/2023 complaining of chest soreness with shortness of breath.  During her evaluation she had CT scan of the chest with contrast that showed numerous bilateral pulmonary nodules consistent with metastatic disease.  The largest on the left is seen at the anterior left lung base and measures 1.4 cm.  There was a dominant right upper lobe lobulated and partially cavitary mass abutting the pleural surface and measure 5.3 x 4.5 x 4.7 cm.  The scan also showed subcarinal lymph node measuring 1.6 cm with a right hilar lobulated mass measuring 5.7 x 4.7 cm with vascular encasement of the right pulmonary vessels.  Scanning of the upper abdomen showed heterogeneous left hepatic lobe masses partially visualized, more superior mass measures 6.7 x 4.3 cm.  There was also indeterminate hypodense splenic masses including 1.6 cm in addition to right hepatic lobe vague lesion measuring 1.7 cm. The patient was referred to me today for evaluation and recommendation regarding her condition. Discussed the use of AI scribe software for clinical note transcription with the patient, who gave verbal consent to proceed.  History of Present Illness   The patient, a 62 year old individual with a history of asthma, anemia, acid reflux, high cholesterol, and vitamin D deficiency, presented to the hospital due to chest and throat discomfort reminiscent of past bronchitis episodes. The patient denied experiencing shortness of breath at the time of presentation. A  chest x-ray was performed at an urgent care center, which was deemed abnormal, prompting the patient's visit to the emergency room.  A subsequent CT scan revealed a large mass in the upper right lung, lymph nodes in the middle of the chest, and a mass in the liver. The patient reported experiencing shortness of breath and mild coughing, but denied chest pain and hemoptysis. They also reported fatigue and blurry vision, but denied unintentional weight loss, nausea, vomiting, diarrhea, headaches, and limb weakness.  The patient has a history of smoking since the age of 10 and has been vaping for the past ten years. They also reported daily alcohol consumption. The patient recently underwent a D&C procedure due to six weeks of vaginal bleeding, but the biopsy results were negative.  The patient's family history includes a brother with a history of throat cancer and both siblings have high blood pressure. The patient's father died young, and the cause of death is unknown. The patient's mother recently passed away during the COVID-19 pandemic.  The patient works in Clinical biochemist, primarily sitting at a desk, and denied exposure to hazardous materials. They reported occasional numbness in one hand during sleep, which they attributed to their sleeping position. The patient also reported occasional swelling in their feet, which they attributed to prolonged sitting.       HPI  Past Medical History:  Diagnosis Date   Anemia    Asthma, mild intermittent, well-controlled    GERD (gastroesophageal reflux disease)    Hx of migraines    Hyperlipidemia    Vitamin D deficiency     Past Surgical History:  Procedure Laterality Date   BREAST SURGERY  reduction   REDUCTION MAMMAPLASTY     TUBAL LIGATION      Family History  Problem Relation Age of Onset   Heart disease Father    Hypertension Father    Diabetes Father    Kidney disease Father     Social History Social History   Tobacco Use    Smoking status: Every Day    Types: E-cigarettes   Smokeless tobacco: Never  Vaping Use   Vaping status: Every Day  Substance Use Topics   Alcohol use: Yes    Alcohol/week: 3.0 standard drinks of alcohol    Types: 3 Standard drinks or equivalent per week   Drug use: Never    Allergies  Allergen Reactions   Biaxin [Clarithromycin] Nausea Only    Current Outpatient Medications  Medication Sig Dispense Refill   albuterol (VENTOLIN HFA) 108 (90 Base) MCG/ACT inhaler Inhale 2 puffs into the lungs every 6 (six) hours as needed for wheezing or shortness of breath. 8 g 2   ALPRAZolam (XANAX) 0.5 MG tablet Take  1/2 to 1 tablet  at bedtime   ONLY  if needed for  Sleep & please try to limit to 5 days /week to avoid Addiction & Dementia. Attempting to wean all patients off benzodiazepines 25 tablet 0   Fluticasone Propionate (FLONASE NA) Place into the nose.     HYDROcodone-acetaminophen (NORCO/VICODIN) 5-325 MG tablet Take 1 tablet by mouth every 6 (six) hours as needed. 10 tablet 0   lidocaine (LIDODERM) 5 % Place 1 patch onto the skin every 12 (twelve) hours. Remove & Discard patch within 12 hours or as directed by MD 10 patch 0   losartan-hydrochlorothiazide (HYZAAR) 50-12.5 MG tablet TAKE 1 TABLET BY MOUTH DAILY 90 tablet 2   meloxicam (MOBIC) 15 MG tablet Take 1/2 to 1 tablet  Daily  with Food  for Pain & Inflammation 90 tablet 3   No current facility-administered medications for this visit.    Review of Systems  Constitutional: positive for fatigue Eyes: negative Ears, nose, mouth, throat, and face: negative Respiratory: negative Cardiovascular: negative Gastrointestinal: negative Genitourinary:negative Integument/breast: negative Hematologic/lymphatic: negative Musculoskeletal:negative Neurological: negative Behavioral/Psych: negative Endocrine: negative Allergic/Immunologic: negative  Physical Exam  ZOX:WRUEA, healthy, no distress, well nourished, well developed, and  anxious SKIN: skin color, texture, turgor are normal, no rashes or significant lesions HEAD: Normocephalic, No masses, lesions, tenderness or abnormalities EYES: normal, PERRLA, Conjunctiva are pink and non-injected EARS: External ears normal, Canals clear OROPHARYNX:no exudate, no erythema, and lips, buccal mucosa, and tongue normal  NECK: supple, no adenopathy, no JVD LYMPH:  no palpable lymphadenopathy, no hepatosplenomegaly BREAST:not examined LUNGS: clear to auscultation , and palpation HEART: regular rate & rhythm, no murmurs, and no gallops ABDOMEN:abdomen soft, non-tender, normal bowel sounds, and no masses or organomegaly BACK: Back symmetric, no curvature., No CVA tenderness EXTREMITIES:no joint deformities, effusion, or inflammation, no edema  NEURO: alert & oriented x 3 with fluent speech, no focal motor/sensory deficits  PERFORMANCE STATUS: ECOG 1  LABORATORY DATA: Lab Results  Component Value Date   WBC 8.7 07/21/2023   HGB 10.9 (L) 07/21/2023   HCT 33.5 (L) 07/21/2023   MCV 96.0 07/21/2023   PLT 204 07/21/2023      Chemistry      Component Value Date/Time   NA 136 07/21/2023 1413   K 3.4 (L) 07/21/2023 1413   CL 100 07/21/2023 1413   CO2 26 07/21/2023 1413   BUN 5 (L) 07/21/2023 1413   CREATININE 0.79  07/21/2023 1413   CREATININE 1.05 10/12/2022 1442      Component Value Date/Time   CALCIUM 9.2 07/21/2023 1413   ALKPHOS 77 05/31/2016 1006   AST 21 10/12/2022 1442   ALT 34 (H) 10/12/2022 1442   BILITOT 0.5 10/12/2022 1442       RADIOGRAPHIC STUDIES: CT Chest W Contrast  Result Date: 07/21/2023 CLINICAL DATA:  Chest soreness with breathing EXAM: CT CHEST WITH CONTRAST TECHNIQUE: Multidetector CT imaging of the chest was performed during intravenous contrast administration. RADIATION DOSE REDUCTION: This exam was performed according to the departmental dose-optimization program which includes automated exposure control, adjustment of the mA and/or kV  according to patient size and/or use of iterative reconstruction technique. CONTRAST:  75mL OMNIPAQUE IOHEXOL 300 MG/ML  SOLN COMPARISON:  Chest x-ray 07/21/2023 FINDINGS: Cardiovascular: Nonaneurysmal aorta. Mild atherosclerosis. Normal cardiac size. No pericardial effusion Mediastinum/Nodes: Midline trachea. No thyroid mass. Subcarinal lymph node measuring 1.6 cm on series 2, image 23. Right hilar lobulated mass measuring 5.7 by 4.7 cm with vascular encasement of right pulmonary vessels. Esophagus within normal limits Lungs/Pleura: No pleural effusion or pneumothorax. Numerous bilateral pulmonary nodules consistent with metastatic disease. Largest on the left is seen at the anterior left lung base and measures 14 mm on series 4, image 100. Dominant right upper lobe lobulated and partially cavitary mass abutting the pleural surface, this measures 5.3 by 4 point 5 by 4.7 cm on series 4, image 32. Mild subpleural bandlike density in the posterior right lower lobe could be secondary to post inflammatory or infectious scarring. Upper Abdomen: Heterogeneous left hepatic lobe masses partially visualized, more superior mass measures 6.7 x 4.3 cm. There is background steatosis. Indeterminate hypodense splenic masses, for example 16 mm lesion series 2, image 52 and 16 mm lesion series 2, image 57. right hepatic lobe vague lesion measuring 1.7 cm on series 2, image 50. Musculoskeletal: No acute osseous abnormality. IMPRESSION: 1. Numerous bilateral pulmonary nodules consistent with metastatic disease. 2. Dominant right apical partially cavitary mass abutting the pleural surface either representing primary lung neoplasm versus metastatic focus. Additional finding of lobulated right hilar mass with narrowing of pulmonary vessels, this could represent nodal conglomerate versus primary lung mass. 3. Hypodense liver masses favored to represent metastatic disease. There is however underlying steatosis 4. Several hypodense splenic  lesions which are indeterminate and raise concern for potential metastatic disease. Aortic Atherosclerosis (ICD10-I70.0). Electronically Signed   By: Jasmine Pang M.D.   On: 07/21/2023 22:04   DG Chest 2 View  Result Date: 07/21/2023 CLINICAL DATA:  pain with inspiration EXAM: CHEST - 2 VIEW COMPARISON:  CXR 12/24/15 FINDINGS: No pleural effusion. No pneumothorax. Normal cardiac and mediastinal contours. New nodular lesions/masses at the right hilum, in the right upper lobe, and in the right mid lung field. No radiographically apparent displaced rib fractures. Visualized upper abdomen is unremarkable. IMPRESSION: New nodular lesions/masses at the right hilum, in the right upper lobe, and in the right mid lung field. Recommend further evaluation with chest CT to confirm the possibility of metastatic disease. Electronically Signed   By: Lorenza Cambridge M.D.   On: 07/21/2023 10:49   DG Knee Complete 4 Views Right  Result Date: 07/13/2023 CLINICAL DATA:  right medial knee pain EXAM: RIGHT KNEE - COMPLETE 4+ VIEW COMPARISON:  None Available. FINDINGS: No acute fracture or dislocation. Mild joint space narrowing and minimal osteophyte formation of the medial compartment. Additional joint spaces are relatively preserved. No area of erosion or osseous destruction. No  unexpected radiopaque foreign body. Soft tissues are unremarkable. IMPRESSION: Mild degenerative changes of the medial compartment. Electronically Signed   By: Meda Klinefelter M.D.   On: 07/13/2023 13:43    ASSESSMENT AND PLAN: This is a very pleasant 62 years old white female with highly suspicious stage IV (T4, N2, M1c) lung cancer likely small cell lung cancer but non-small cell lung cancer is also consideration.  This could be also metastatic disease from another primary, awaiting the final pathology. I personally and independently reviewed her imaging studies and discussed the result with the patient and her boyfriend.    Suspected Lung  Cancer Large mass in the upper part of the right lung, lymph nodes in the mediastinum and right hilar region, and a mass in the liver identified on CT scan. Patient presents with shortness of breath, mild cough, and fatigue. No weight loss, hemoptysis, or neurological symptoms. History of smoking and current vaping. -Order PET scan and MRI brain to confirm the extent of disease. -Refer to pulmonologist for biopsy to confirm diagnosis and determine the type of lung cancer. -Schedule follow-up appointment in two weeks to discuss results and treatment options.  Asthma Chronic condition, no acute exacerbation reported. -Continue current management.  Anemia Chronic condition, no acute exacerbation reported. -Continue current management.  Acid Reflux Chronic condition, no acute exacerbation reported. -Continue current management.  Hyperlipidemia Chronic condition, no acute exacerbation reported. -Continue current management.  Vitamin D Deficiency Chronic condition, no acute exacerbation reported. -Continue current management.  Hypertension Chronic condition, no acute exacerbation reported. -Continue current management.  General Health Maintenance -Advise patient to quit vaping. -Encourage patient to maintain a healthy diet and stay active.   The patient will come back for follow-up visit in 3 weeks for evaluation after returning from her vacation that is coming in 2 weeks. I will give her Ativan 0.5 mg p.o. x 1 to be repeated once if needed for the MRI claustrophobia. She was advised to call immediately if she has any other concerning symptoms in the interval. The patient voices understanding of current disease status and treatment options and is in agreement with the current care plan.  All questions were answered. The patient knows to call the clinic with any problems, questions or concerns. We can certainly see the patient much sooner if necessary.  Thank you so much for allowing  me to participate in the care of [facility]. I will continue to follow up the patient with you and assist in her care.  The total time spent in the appointment was 90 minutes.  Disclaimer: This note was dictated with voice recognition software. Similar sounding words can inadvertently be transcribed and may not be corrected upon review.   Lajuana Matte July 31, 2023, 1:37 PM

## 2023-07-31 NOTE — Progress Notes (Signed)
I notified the pt by phone that her MRI appt was this Saturday 10/5 at 9am at [facility] she understands to arrive by 830 and that she needs to go through the ED entrance I also notified her of her PET scan appt on 10/11 at [facility] at 11am. She understands that she needs to be at [facility] by 1030, and that she can't eat after 4am except sips of water. No questions about appts.

## 2023-07-31 NOTE — Progress Notes (Signed)
I met the pt today at her consult with Dr.mohamed. Pt was accompanied by her partner, Rebecca Ochoa. I explained the role of a NN and provided my business card with my contact information to the patient. The plan for the pt is to complete her cancer work up with a PET scan and brain MRI as well as a referral to Pulmonology. I explained that I would be making the appts for her and will call her to let her know the appt days and time. Follow up appt with with Dr.Mohamed for week of 10/21 after pt returns from vacation.  I notified the pulmonology clinic via IB msg of the urgent referral.  I called central scheduling and scheduled the pt's MRI for 10/5 at [facility] at 9am and PET scan on 10/11 at [facility] at 11am.

## 2023-08-01 ENCOUNTER — Telehealth: Payer: Self-pay | Admitting: Emergency Medicine

## 2023-08-01 NOTE — Telephone Encounter (Signed)
Dr Inis Sizer, this patient has been referred by Dr Shirline Frees for a large right lung mass, and he is requesting a bronch. There is a CT from 07/21/23 in her chart. Unfortunately, both you and Dr Tonia Brooms are booked/not in clinic until 11/1. Would you mind looking at her scan and seeing if she is okay to wait that long, or if we need to double book her on your schedule?

## 2023-08-02 ENCOUNTER — Encounter: Payer: Self-pay | Admitting: Emergency Medicine

## 2023-08-02 ENCOUNTER — Ambulatory Visit (INDEPENDENT_AMBULATORY_CARE_PROVIDER_SITE_OTHER): Payer: No Typology Code available for payment source | Admitting: Emergency Medicine

## 2023-08-02 VITALS — BP 142/86 | HR 100 | Ht 65.0 in | Wt 180.4 lb

## 2023-08-02 DIAGNOSIS — R9389 Abnormal findings on diagnostic imaging of other specified body structures: Secondary | ICD-10-CM | POA: Diagnosis not present

## 2023-08-02 NOTE — H&P (View-Only) (Signed)
Subjective:    Patient ID: Rebecca Ochoa, female    DOB: 07/31/1961, 62 y.o.   MRN: [id number]  HPI 62 year old woman with a history of tobacco use (55 pack years, still vapes), asthma/COPD, anemia, GERD, dyslipidemia.  She was seen in the ED 07/21/2023 with chest soreness and dyspnea.  A CT chest was performed that identified multiple bilateral pulmonary nodules and mediastinal and subcarinal adenopathy.   She has been dealing w cough, flaring sx including a flare that was associated COVID about 2 months ago. She has had some chest burning on inspiration.   CT chest 07/21/2023 reviewed by me shows 1.6 cm subcarinal node, right hilar lobulated mass 5.7 x 4.7 cm encasing the right pulmonary vessels, numerous bilateral pulmonary nodules consistent with metastatic disease largest in the left lower lobe 14 mm.  Dominant right upper lobe lobulated partially cavitary mass at the pleural surface 5.3 x 4.5 x 4.7 cm.  There are heterogeneous left hepatic lobe masses noted as well   Review of Systems As per HPI  Past Medical History:  Diagnosis Date   Anemia    Asthma, mild intermittent, well-controlled    GERD (gastroesophageal reflux disease)    Hx of migraines    Hyperlipidemia    Vitamin D deficiency      Family History  Problem Relation Age of Onset   Heart disease Father    Hypertension Father    Diabetes Father    Kidney disease Father      Social History   Socioeconomic History   Marital status: Married    Spouse name: Not on file   Number of children: Not on file   Years of education: Not on file   Highest education level: Not on file  Occupational History   Not on file  Tobacco Use   Smoking status: Every Day    Types: E-cigarettes   Smokeless tobacco: Never  Vaping Use   Vaping status: Every Day  Substance and Sexual Activity   Alcohol use: Yes    Alcohol/week: 3.0 standard drinks of alcohol    Types: 3 Standard drinks or equivalent per week   Drug use: Never    Sexual activity: Not on file  Other Topics Concern   Not on file  Social History Narrative   Not on file   Social Determinants of Health   Financial Resource Strain: Not on file  Food Insecurity: Not on file  Transportation Needs: Not on file  Physical Activity: Not on file  Stress: Not on file  Social Connections: Not on file  Intimate Partner Violence: Not on file     Allergies  Allergen Reactions   Biaxin [Clarithromycin] Nausea Only     Outpatient Medications Prior to Visit  Medication Sig Dispense Refill   albuterol (VENTOLIN HFA) 108 (90 Base) MCG/ACT inhaler Inhale 2 puffs into the lungs every 6 (six) hours as needed for wheezing or shortness of breath. 8 g 2   Fluticasone Propionate (FLONASE NA) Place into the nose.     HYDROcodone-acetaminophen (NORCO/VICODIN) 5-325 MG tablet Take 1 tablet by mouth every 6 (six) hours as needed. 10 tablet 0   lidocaine (LIDODERM) 5 % Place 1 patch onto the skin every 12 (twelve) hours. Remove & Discard patch within 12 hours or as directed by MD 10 patch 0   LORazepam (ATIVAN) 0.5 MG tablet Take 1 tablet p.o. 30 minutes before the MRI.  Repeat x 1 if needed. 2 tablet 0   losartan-hydrochlorothiazide (HYZAAR)  50-12.5 MG tablet TAKE 1 TABLET BY MOUTH DAILY 90 tablet 2   meloxicam (MOBIC) 15 MG tablet Take 1/2 to 1 tablet  Daily  with Food  for Pain & Inflammation 90 tablet 3   No facility-administered medications prior to visit.         Objective:   Physical Exam Vitals:   08/02/23 1558  BP: (!) 142/86  Pulse: 100  SpO2: 96%  Weight: 180 lb 6.4 oz (81.8 kg)  Height: 5\' 5"  (1.651 m)   Gen: Pleasant, well-nourished, in no distress,  normal affect  ENT: No lesions,  mouth clear,  oropharynx clear, no postnasal drip  Neck: No JVD, no stridor  Lungs: No use of accessory muscles, no crackles or wheezing on normal respiration, no wheeze on forced expiration  Cardiovascular: RRR, heart sounds normal, no murmur or gallops, no  peripheral edema  Musculoskeletal: No deformities, no cyanosis or clubbing  Neuro: alert, awake, non focal  Skin: Warm, no lesions or rash      Assessment & Plan:  Abnormal CT of the chest Lung mass and bilateral pulmonary nodules, mediastinal adenopathy.  She needs a bronchoscopy as soon as possible, navigation and endobronchial ultrasound.  We may be able to make the diagnosis with EBUS alone.  We will try to get this scheduled for 08/08/2023.  She has already seen Dr. Arbutus Ped, has PET scan and MRI brain ordered.    Levy Pupa, MD, PhD 08/02/2023, 4:33 PM [facility] [phone] or if no answer before 7:00PM call [phone] For any issues after 7:00PM please call eLink [phone]

## 2023-08-02 NOTE — Progress Notes (Signed)
Subjective:    Patient ID: Rebecca Ochoa, female    DOB: 07/31/1961, 62 y.o.   MRN: [id number]  HPI 62 year old woman with a history of tobacco use (55 pack years, still vapes), asthma/COPD, anemia, GERD, dyslipidemia.  She was seen in the ED 07/21/2023 with chest soreness and dyspnea.  A CT chest was performed that identified multiple bilateral pulmonary nodules and mediastinal and subcarinal adenopathy.   She has been dealing w cough, flaring sx including a flare that was associated COVID about 2 months ago. She has had some chest burning on inspiration.   CT chest 07/21/2023 reviewed by me shows 1.6 cm subcarinal node, right hilar lobulated mass 5.7 x 4.7 cm encasing the right pulmonary vessels, numerous bilateral pulmonary nodules consistent with metastatic disease largest in the left lower lobe 14 mm.  Dominant right upper lobe lobulated partially cavitary mass at the pleural surface 5.3 x 4.5 x 4.7 cm.  There are heterogeneous left hepatic lobe masses noted as well   Review of Systems As per HPI  Past Medical History:  Diagnosis Date   Anemia    Asthma, mild intermittent, well-controlled    GERD (gastroesophageal reflux disease)    Hx of migraines    Hyperlipidemia    Vitamin D deficiency      Family History  Problem Relation Age of Onset   Heart disease Father    Hypertension Father    Diabetes Father    Kidney disease Father      Social History   Socioeconomic History   Marital status: Married    Spouse name: Not on file   Number of children: Not on file   Years of education: Not on file   Highest education level: Not on file  Occupational History   Not on file  Tobacco Use   Smoking status: Every Day    Types: E-cigarettes   Smokeless tobacco: Never  Vaping Use   Vaping status: Every Day  Substance and Sexual Activity   Alcohol use: Yes    Alcohol/week: 3.0 standard drinks of alcohol    Types: 3 Standard drinks or equivalent per week   Drug use: Never    Sexual activity: Not on file  Other Topics Concern   Not on file  Social History Narrative   Not on file   Social Determinants of Health   Financial Resource Strain: Not on file  Food Insecurity: Not on file  Transportation Needs: Not on file  Physical Activity: Not on file  Stress: Not on file  Social Connections: Not on file  Intimate Partner Violence: Not on file     Allergies  Allergen Reactions   Biaxin [Clarithromycin] Nausea Only     Outpatient Medications Prior to Visit  Medication Sig Dispense Refill   albuterol (VENTOLIN HFA) 108 (90 Base) MCG/ACT inhaler Inhale 2 puffs into the lungs every 6 (six) hours as needed for wheezing or shortness of breath. 8 g 2   Fluticasone Propionate (FLONASE NA) Place into the nose.     HYDROcodone-acetaminophen (NORCO/VICODIN) 5-325 MG tablet Take 1 tablet by mouth every 6 (six) hours as needed. 10 tablet 0   lidocaine (LIDODERM) 5 % Place 1 patch onto the skin every 12 (twelve) hours. Remove & Discard patch within 12 hours or as directed by MD 10 patch 0   LORazepam (ATIVAN) 0.5 MG tablet Take 1 tablet p.o. 30 minutes before the MRI.  Repeat x 1 if needed. 2 tablet 0   losartan-hydrochlorothiazide (HYZAAR)  50-12.5 MG tablet TAKE 1 TABLET BY MOUTH DAILY 90 tablet 2   meloxicam (MOBIC) 15 MG tablet Take 1/2 to 1 tablet  Daily  with Food  for Pain & Inflammation 90 tablet 3   No facility-administered medications prior to visit.         Objective:   Physical Exam Vitals:   08/02/23 1558  BP: (!) 142/86  Pulse: 100  SpO2: 96%  Weight: 180 lb 6.4 oz (81.8 kg)  Height: 5\' 5"  (1.651 m)   Gen: Pleasant, well-nourished, in no distress,  normal affect  ENT: No lesions,  mouth clear,  oropharynx clear, no postnasal drip  Neck: No JVD, no stridor  Lungs: No use of accessory muscles, no crackles or wheezing on normal respiration, no wheeze on forced expiration  Cardiovascular: RRR, heart sounds normal, no murmur or gallops, no  peripheral edema  Musculoskeletal: No deformities, no cyanosis or clubbing  Neuro: alert, awake, non focal  Skin: Warm, no lesions or rash      Assessment & Plan:  Abnormal CT of the chest Lung mass and bilateral pulmonary nodules, mediastinal adenopathy.  She needs a bronchoscopy as soon as possible, navigation and endobronchial ultrasound.  We may be able to make the diagnosis with EBUS alone.  We will try to get this scheduled for 08/08/2023.  She has already seen Dr. Arbutus Ped, has PET scan and MRI brain ordered.    Levy Pupa, MD, PhD 08/02/2023, 4:33 PM [facility] [phone] or if no answer before 7:00PM call [phone] For any issues after 7:00PM please call eLink [phone]

## 2023-08-02 NOTE — Assessment & Plan Note (Signed)
Lung mass and bilateral pulmonary nodules, mediastinal adenopathy.  She needs a bronchoscopy as soon as possible, navigation and endobronchial ultrasound.  We may be able to make the diagnosis with EBUS alone.  We will try to get this scheduled for 08/08/2023.  She has already seen Dr. Arbutus Ped, has PET scan and MRI brain ordered.

## 2023-08-02 NOTE — Telephone Encounter (Signed)
Patient called and scheduled to see Dr. Delton Coombes today 08/02/23 at 4pm. Ok per RB to add on to held slot.

## 2023-08-02 NOTE — Patient Instructions (Addendum)
We reviewed your CT scan of the chest today. We will arrange for navigational bronchoscopy and endobronchial ultrasound to evaluate your pulmonary nodules.  This will be done as an outpatient under general anesthesia at [facility] endoscopy.  You will need a designated driver and someone to watch you on that day after the test.  Will try to get this set up for 08/08/2023. Follow with T. Parrett in about 2 weeks or next available opening

## 2023-08-04 ENCOUNTER — Encounter (HOSPITAL_COMMUNITY): Payer: Self-pay | Admitting: Emergency Medicine

## 2023-08-04 ENCOUNTER — Other Ambulatory Visit: Payer: Self-pay

## 2023-08-04 NOTE — Progress Notes (Signed)
PCP - [name], MD Cardiologist - [name], MD  PPM/ICD - denies Device Orders - n/a Rep Notified - n/a  Chest x-ray - 07-21-23 EKG - 07-21-23 Stress Test - denies ECHO - denies Cardiac Cath - denies  CPAP -  denies  DM denies  Blood Thinner Instructions: denies Aspirin Instructions: n/a  ERAS Protcol - NPO  COVID TEST- n/a  Anesthesia review: Yes asthma recent ED visit Patient denies any discomfort or pain at this time.  Patient verbally denies any shortness of breath, fever, cough and chest pain during phone call   -------------  SDW INSTRUCTIONS given:  Your procedure is scheduled on August 08, 2023.  Report to [facility] Main Entrance "A" at 12:45 P.M., and check in at the Admitting office.  Call this number if you have problems the morning of surgery:  [phone]   Remember:  Do not eat after midnight the night before your surgery     Take these medicines the morning of surgery with A SIP OF WATER    IF NEEDED albuterol (VENTOLIN HFA) HYDROcodone-acetaminophen (NORCO/VICODIN)    As of today, STOP taking any Aspirin (unless otherwise instructed by your surgeon) Aleve, Naproxen, Ibuprofen, Motrin, Advil, Goody's, BC's, all herbal medications, fish oil, and all vitamins.  This includes your meloxicam (MOBIC) .                      Do not wear jewelry, make up, or nail polish            Do not wear lotions, powders, perfumes/colognes, or deodorant.            Do not shave 48 hours prior to surgery.  Men may shave face and neck.            Do not bring valuables to the hospital.            [facility] is not responsible for any belongings or valuables.  Do NOT Smoke (Tobacco/Vaping) 24 hours prior to your procedure If you use a CPAP at night, you may bring all equipment for your overnight stay.   Contacts, glasses, dentures or bridgework may not be worn into surgery.      For patients admitted to the hospital, discharge time will be  determined by your treatment team.   Patients discharged the day of surgery will not be allowed to drive home, and someone needs to stay with them for 24 hours.    Special instructions:   [facility]- Preparing For Surgery  Before surgery, you can play an important role. Because skin is not sterile, your skin needs to be as free of germs as possible. You can reduce the number of germs on your skin by washing with CHG (chlorahexidine gluconate) Soap before surgery.  CHG is an antiseptic cleaner which kills germs and bonds with the skin to continue killing germs even after washing.    Oral Hygiene is also important to reduce your risk of infection.  Remember - BRUSH YOUR TEETH THE MORNING OF SURGERY WITH YOUR REGULAR TOOTHPASTE  Please do not use if you have an allergy to CHG or antibacterial soaps. If your skin becomes reddened/irritated stop using the CHG.  Do not shave (including legs and underarms) for at least 48 hours prior to first CHG shower. It is OK to shave your face.  Please follow these instructions carefully.   Shower the NIGHT BEFORE SURGERY and the MORNING OF SURGERY with DIAL  Soap.   Pat yourself dry with a CLEAN TOWEL.  Wear CLEAN PAJAMAS to bed the night before surgery  Place CLEAN SHEETS on your bed the night of your first shower and DO NOT SLEEP WITH PETS.   Day of Surgery: Please shower morning of surgery  Wear Clean/Comfortable clothing the morning of surgery Do not apply any deodorants/lotions.   Remember to brush your teeth WITH YOUR REGULAR TOOTHPASTE.   Questions were answered. Patient verbalized understanding of instructions.

## 2023-08-05 ENCOUNTER — Ambulatory Visit (HOSPITAL_COMMUNITY)
Admission: RE | Admit: 2023-08-05 | Discharge: 2023-08-05 | Disposition: A | Discharge status: Home / Self Care | Payer: No Typology Code available for payment source | Source: Ambulatory Visit | Attending: Internal Medicine | Admitting: Internal Medicine

## 2023-08-05 DIAGNOSIS — C3490 Malignant neoplasm of unspecified part of unspecified bronchus or lung: Secondary | ICD-10-CM | POA: Diagnosis present

## 2023-08-05 MED ORDER — GADOBUTROL 1 MMOL/ML IV SOLN
8.0000 mL | Freq: Once | INTRAVENOUS | Status: AC | PRN
  Administered 2023-08-05: 8 mL via INTRAVENOUS

## 2023-08-07 NOTE — Progress Notes (Signed)
Patient made aware of time change and verbalized understanding of new arrival time of [phone]

## 2023-08-07 NOTE — Anesthesia Preprocedure Evaluation (Signed)
Anesthesia Evaluation  Patient identified by MRN, date of birth, ID band Patient awake    Reviewed: Allergy & Precautions, NPO status , Patient's Chart, lab work & pertinent test results  History of Anesthesia Complications Negative for: history of anesthetic complications  Airway Mallampati: II  TM Distance: >3 FB Neck ROM: Full    Dental  (+) Upper Dentures, Partial Lower   Pulmonary asthma , Current Smoker and Patient abstained from smoking.   Pulmonary exam normal        Cardiovascular hypertension, Pt. on medications Normal cardiovascular exam     Neuro/Psych   Anxiety        GI/Hepatic Neg liver ROS,GERD  Controlled,,  Endo/Other  negative endocrine ROS    Renal/GU negative Renal ROS     Musculoskeletal negative musculoskeletal ROS (+)    Abdominal   Peds  Hematology  (+) Blood dyscrasia (Hgb 11.3), anemia   Anesthesia Other Findings Day of surgery medications reviewed with patient.  Reproductive/Obstetrics                              Anesthesia Physical Anesthesia Plan  ASA: 3  Anesthesia Plan: General   Post-op Pain Management: Minimal or no pain anticipated   Induction: Intravenous  PONV Risk Score and Plan: 3 and Treatment may vary due to age or medical condition, Ondansetron, Dexamethasone and Midazolam  Airway Management Planned: Oral ETT  Additional Equipment: None  Intra-op Plan:   Post-operative Plan: Extubation in OR  Informed Consent: I have reviewed the patients History and Physical, chart, labs and discussed the procedure including the risks, benefits and alternatives for the proposed anesthesia with the patient or authorized representative who has indicated his/her understanding and acceptance.     Dental advisory given  Plan Discussed with: CRNA  Anesthesia Plan Comments:         Anesthesia Quick Evaluation

## 2023-08-08 ENCOUNTER — Ambulatory Visit (HOSPITAL_COMMUNITY): Payer: No Typology Code available for payment source

## 2023-08-08 ENCOUNTER — Ambulatory Visit (HOSPITAL_COMMUNITY)
Admission: RE | Admit: 2023-08-08 | Discharge: 2023-08-08 | Disposition: A | Discharge status: Home / Self Care | Payer: No Typology Code available for payment source | Attending: Emergency Medicine | Admitting: Emergency Medicine

## 2023-08-08 ENCOUNTER — Ambulatory Visit (HOSPITAL_COMMUNITY): Payer: Self-pay | Admitting: Physician Assistant

## 2023-08-08 ENCOUNTER — Ambulatory Visit (HOSPITAL_BASED_OUTPATIENT_CLINIC_OR_DEPARTMENT_OTHER): Payer: Self-pay | Admitting: Physician Assistant

## 2023-08-08 ENCOUNTER — Other Ambulatory Visit: Payer: Self-pay

## 2023-08-08 ENCOUNTER — Encounter (HOSPITAL_COMMUNITY): Payer: Self-pay | Admitting: Emergency Medicine

## 2023-08-08 ENCOUNTER — Encounter (HOSPITAL_COMMUNITY)
Admission: RE | Disposition: A | Discharge status: Home / Self Care | Payer: Self-pay | Source: Home / Self Care | Attending: Emergency Medicine

## 2023-08-08 DIAGNOSIS — R9389 Abnormal findings on diagnostic imaging of other specified body structures: Secondary | ICD-10-CM | POA: Diagnosis present

## 2023-08-08 DIAGNOSIS — J4520 Mild intermittent asthma, uncomplicated: Secondary | ICD-10-CM | POA: Insufficient documentation

## 2023-08-08 DIAGNOSIS — J4489 Other specified chronic obstructive pulmonary disease: Secondary | ICD-10-CM | POA: Insufficient documentation

## 2023-08-08 DIAGNOSIS — I10 Essential (primary) hypertension: Secondary | ICD-10-CM | POA: Insufficient documentation

## 2023-08-08 DIAGNOSIS — C771 Secondary and unspecified malignant neoplasm of intrathoracic lymph nodes: Secondary | ICD-10-CM | POA: Insufficient documentation

## 2023-08-08 DIAGNOSIS — F17290 Nicotine dependence, other tobacco product, uncomplicated: Secondary | ICD-10-CM | POA: Diagnosis not present

## 2023-08-08 DIAGNOSIS — R918 Other nonspecific abnormal finding of lung field: Secondary | ICD-10-CM

## 2023-08-08 DIAGNOSIS — Z79899 Other long term (current) drug therapy: Secondary | ICD-10-CM | POA: Diagnosis not present

## 2023-08-08 DIAGNOSIS — C3401 Malignant neoplasm of right main bronchus: Secondary | ICD-10-CM | POA: Insufficient documentation

## 2023-08-08 DIAGNOSIS — C3411 Malignant neoplasm of upper lobe, right bronchus or lung: Secondary | ICD-10-CM | POA: Insufficient documentation

## 2023-08-08 DIAGNOSIS — F419 Anxiety disorder, unspecified: Secondary | ICD-10-CM | POA: Diagnosis not present

## 2023-08-08 HISTORY — PX: HEMOSTASIS CONTROL: SHX6838

## 2023-08-08 HISTORY — DX: Essential (primary) hypertension: I10

## 2023-08-08 HISTORY — PX: ENDOBRONCHIAL ULTRASOUND: SHX5096

## 2023-08-08 HISTORY — PX: BRONCHIAL BIOPSY: SHX5109

## 2023-08-08 HISTORY — PX: BRONCHIAL NEEDLE ASPIRATION BIOPSY: SHX5106

## 2023-08-08 HISTORY — PX: BRONCHIAL BRUSHINGS: SHX5108

## 2023-08-08 SURGERY — ENDOBRONCHIAL ULTRASOUND (EBUS)
Anesthesia: General | Laterality: Bilateral

## 2023-08-08 MED ORDER — PROPOFOL 10 MG/ML IV BOLUS
INTRAVENOUS | Status: DC | PRN
  Administered 2023-08-08: 170 mg via INTRAVENOUS
  Administered 2023-08-08: 30 mg via INTRAVENOUS

## 2023-08-08 MED ORDER — DROPERIDOL 2.5 MG/ML IJ SOLN: 0.6250 mg | Freq: Once | INTRAMUSCULAR | Status: DC | PRN

## 2023-08-08 MED ORDER — FENTANYL CITRATE (PF) 100 MCG/2ML IJ SOLN: 25.0000 ug | INTRAMUSCULAR | Status: DC | PRN

## 2023-08-08 MED ORDER — ROCURONIUM BROMIDE 10 MG/ML (PF) SYRINGE
PREFILLED_SYRINGE | INTRAVENOUS | Status: DC | PRN
  Administered 2023-08-08: 60 mg via INTRAVENOUS

## 2023-08-08 MED ORDER — MIDAZOLAM HCL 2 MG/2ML IJ SOLN
INTRAMUSCULAR | Status: DC | PRN
  Administered 2023-08-08: 2 mg via INTRAVENOUS

## 2023-08-08 MED ORDER — EPINEPHRINE 1 MG/10ML IJ SOSY
PREFILLED_SYRINGE | INTRAMUSCULAR | Status: AC
  Filled 2023-08-08: qty 10

## 2023-08-08 MED ORDER — MIDAZOLAM HCL 2 MG/2ML IJ SOLN
INTRAMUSCULAR | Status: AC
  Filled 2023-08-08: qty 2

## 2023-08-08 MED ORDER — CHLORHEXIDINE GLUCONATE 0.12 % MT SOLN: 15.0000 mL | Freq: Once | OROMUCOSAL | Status: AC

## 2023-08-08 MED ORDER — ONDANSETRON HCL 4 MG/2ML IJ SOLN
INTRAMUSCULAR | Status: DC | PRN
  Administered 2023-08-08: 4 mg via INTRAVENOUS

## 2023-08-08 MED ORDER — LACTATED RINGERS IV SOLN
INTRAVENOUS | Status: DC | PRN
Start: 2023-08-08 — End: 2023-08-08

## 2023-08-08 MED ORDER — DEXAMETHASONE SODIUM PHOSPHATE 10 MG/ML IJ SOLN
INTRAMUSCULAR | Status: DC | PRN
  Administered 2023-08-08: 10 mg via INTRAVENOUS

## 2023-08-08 MED ORDER — CHLORHEXIDINE GLUCONATE 0.12 % MT SOLN
OROMUCOSAL | Status: AC
  Administered 2023-08-08: 15 mL via OROMUCOSAL
  Filled 2023-08-08: qty 15

## 2023-08-08 MED ORDER — SUGAMMADEX SODIUM 200 MG/2ML IV SOLN
INTRAVENOUS | Status: DC | PRN
  Administered 2023-08-08: 200 mg via INTRAVENOUS

## 2023-08-08 MED ORDER — SODIUM CHLORIDE 0.9 % IV SOLN: INTRAVENOUS | Status: DC

## 2023-08-08 MED ORDER — SODIUM CHLORIDE (PF) 0.9 % IJ SOLN
INTRAMUSCULAR | Status: DC | PRN
  Administered 2023-08-08: 2 mL

## 2023-08-08 MED ORDER — LIDOCAINE 2% (20 MG/ML) 5 ML SYRINGE
INTRAMUSCULAR | Status: DC | PRN
  Administered 2023-08-08: 100 mg via INTRAVENOUS

## 2023-08-08 MED ORDER — PHENYLEPHRINE HCL-NACL 20-0.9 MG/250ML-% IV SOLN
INTRAVENOUS | Status: DC | PRN
  Administered 2023-08-08: 25 ug/min via INTRAVENOUS

## 2023-08-08 NOTE — Transfer of Care (Signed)
Immediate Anesthesia Transfer of Care Note  Patient: Charice Zuno  Procedure(s) Performed: ENDOBRONCHIAL ULTRASOUND (Bilateral) BRONCHIAL BIOPSIES BRONCHIAL BRUSHINGS BRONCHIAL NEEDLE ASPIRATION BIOPSIES HEMOSTASIS CONTROL  Patient Location: PACU  Anesthesia Type:General  Level of Consciousness: awake, alert , and oriented  Airway & Oxygen Therapy: Patient Spontanous Breathing  Post-op Assessment: Report given to RN  Post vital signs: Reviewed and stable  Last Vitals:  Vitals Value Taken Time  BP 119/58 08/08/23 1045  Temp 36.9 C 08/08/23 1045  Pulse 101 08/08/23 1046  Resp 26 08/08/23 1046  SpO2 95 % 08/08/23 1046  Vitals shown include unfiled device data.  Last Pain:  Vitals:   08/08/23 0712  TempSrc:   PainSc: 0-No pain         Complications: No notable events documented.

## 2023-08-08 NOTE — Interval H&P Note (Signed)
History and Physical Interval Note:  08/08/2023 7:50 AM  Rebecca Ochoa  has presented today for surgery, with the diagnosis of BILATERAL PULMONARY NODULE  MEDIASTINAL ADENOPATHY.  The various methods of treatment have been discussed with the patient and family. After consideration of risks, benefits and other options for treatment, the patient has consented to  Procedure(s): ROBOTIC ASSISTED NAVIGATIONAL BRONCHOSCOPY (Bilateral) ENDOBRONCHIAL ULTRASOUND (Bilateral) as a surgical intervention.  The patient's history has been reviewed, patient examined, no change in status, stable for surgery.  I have reviewed the patient's chart and labs.  Questions were answered to the patient's satisfaction.     Leslye Peer

## 2023-08-08 NOTE — Anesthesia Postprocedure Evaluation (Signed)
Anesthesia Post Note  Patient: Rebecca Ochoa  Procedure(s) Performed: ENDOBRONCHIAL ULTRASOUND (Bilateral) BRONCHIAL BIOPSIES BRONCHIAL BRUSHINGS BRONCHIAL NEEDLE ASPIRATION BIOPSIES HEMOSTASIS CONTROL     Patient location during evaluation: PACU Anesthesia Type: General Level of consciousness: awake and alert Pain management: pain level controlled Vital Signs Assessment: post-procedure vital signs reviewed and stable Respiratory status: spontaneous breathing, nonlabored ventilation and respiratory function stable Cardiovascular status: blood pressure returned to baseline Postop Assessment: no apparent nausea or vomiting Anesthetic complications: no   No notable events documented.  Last Vitals:  Vitals:   08/08/23 1100 08/08/23 1115  BP: 115/64 (!) 107/57  Pulse: 99 93  Resp: 19 18  Temp:  36.9 C  SpO2: 94% 92%    Last Pain:  Vitals:   08/08/23 1115  TempSrc:   PainSc: 0-No pain                 Shanda Howells

## 2023-08-08 NOTE — Op Note (Signed)
Video Bronchoscopy with Endobronchial Ultrasound Procedure Note  Date of Operation: 08/08/2023  Pre-op Diagnosis: Right upper lobe mass, right hilar mass, multiple pulmonary nodules  Post-op Diagnosis: Same  Surgeon: Levy Pupa  Assistants: None  Anesthesia: General endotracheal anesthesia  Operation: Flexible video fiberoptic bronchoscopy with endobronchial ultrasound and biopsies.  Estimated Blood Loss: Minimal  Complications: None apparent  Indications and History: Rebecca Ochoa is a 62 y.o. female with tobacco use, COPD.  She was evaluated for chest soreness, dyspnea and found to have significantly abnormal CT scan chest with right upper lobe mass, right hilar mass, multiple bilateral rounded pulmonary nodules consistent with metastatic disease.  Recommendation made to achieve a tissue diagnosis via bronchoscopy with endobronchial ultrasound.  The risks, benefits, complications, treatment options and expected outcomes were discussed with the patient.  The possibilities of pneumothorax, pneumonia, reaction to medication, pulmonary aspiration, perforation of a viscus, bleeding, failure to diagnose a condition and creating a complication requiring transfusion or operation were discussed with the patient who freely signed the consent.    Description of Procedure: The patient was examined in the preoperative area and history and data from the preprocedure consultation were reviewed. It was deemed appropriate to proceed.  The patient was taken to [facility] endoscopy room 3, identified as Rebecca Ochoa and the procedure verified as Flexible Video Fiberoptic Bronchoscopy.  A Time Out was held and the above information confirmed. After being taken to the operating room general anesthesia was initiated and the patient  was orally intubated. The video fiberoptic bronchoscope was introduced via the endotracheal tube and a general inspection was performed which showed significant abnormality in the  right mainstem bronchus extending down into the bronchus intermedius.  The mucosa was irregular, raised with endobronchial lesion present.  The mucosal abnormality extended up to the main carina and the anterior aspect of the left mainstem bronchus.  The right upper lobe bronchus was occluded by raised, white, irregular endobronchial lesion.  Small patent airways could be seen in the right upper lobe segments.  Endobronchial biopsies were performed in the right upper lobe airway for pathology.  Endobronchial brushings were performed on the bronchus intermedius endobronchial lesion for cytology. The standard scope was then withdrawn and the endobronchial ultrasound was used to identify and characterize the peritracheal, hilar and bronchial lymph nodes. Inspection showed a large right hilar mass, enlargement at stations 4R, 7. Using real-time ultrasound guidance Wang needle biopsies were take from Station 4R, 7 nodes and the large right hilar mass and were sent for cytology.  The preliminary cytology was read as benign necrotic material, final interpretation pending.  The patient tolerated the procedure well without apparent complications. There was no significant blood loss. The bronchoscope was withdrawn. Anesthesia was reversed and the patient was taken to the PACU for recovery.   Samples: 1. Wang needle biopsies from 4R node 2. Wang needle biopsies from 7 node 3. Wang needle biopsies from right hilar mass 4.  Endobronchial forceps biopsies right upper lobe airway 5.  Endobronchial brushings bronchus intermedius  Plans:  The patient will be discharged from the PACU to home when recovered from anesthesia. We will review the cytology, pathology and microbiology results with the patient when they become available. Outpatient followup will be with her Rebecca Ochoa and Dr. Arbutus Ped.   Levy Pupa, MD, PhD 08/08/2023, 10:41 AM [facility] [phone] or if no answer before 7:00PM call  [phone] For any issues after 7:00PM please call eLink [phone]

## 2023-08-08 NOTE — Anesthesia Procedure Notes (Signed)
Procedure Name: Intubation Date/Time: 08/08/2023 9:38 AM  Performed by: Susy Manor, CRNAPre-anesthesia Checklist: Patient identified, Emergency Drugs available, Suction available and Patient being monitored Patient Re-evaluated:Patient Re-evaluated prior to induction Oxygen Delivery Method: Circle System Utilized Preoxygenation: Pre-oxygenation with 100% oxygen Induction Type: IV induction Ventilation: Mask ventilation without difficulty Laryngoscope Size: Mac and 3 Grade View: Grade II Tube type: Oral Tube size: 8.5 mm Number of attempts: 1 Airway Equipment and Method: Stylet and Oral airway Placement Confirmation: ETT inserted through vocal cords under direct vision, positive ETCO2 and breath sounds checked- equal and bilateral Tube secured with: Tape Dental Injury: Teeth and Oropharynx as per pre-operative assessment

## 2023-08-08 NOTE — Discharge Instructions (Addendum)
Flexible Bronchoscopy, Care After This sheet gives you information about how to care for yourself after your test. Your doctor may also give you more specific instructions. If you have problems or questions, contact your doctor. Follow these instructions at home: Eating and drinking When your numbness is gone and your cough and gag reflexes have come back, you may: Eat only soft foods. Slowly drink liquids. The day after the test, go back to your normal diet. Driving Do not drive for 24 hours if you were given a medicine to help you relax (sedative). Do not drive or use heavy machinery while taking prescription pain medicine. General instructions  Take over-the-counter and prescription medicines only as told by your doctor. Return to your normal activities as told. Ask what activities are safe for you. Do not use any products that have nicotine or tobacco in them. This includes cigarettes and e-cigarettes. If you need help quitting, ask your doctor. Keep all follow-up visits as told by your doctor. This is important. It is very important if you had a tissue sample (biopsy) taken. Get help right away if: You have shortness of breath that gets worse. You get light-headed. You feel like you are going to pass out (faint). You have chest pain. You cough up: More than a little blood. More blood than before. Summary Do not eat or drink anything (not even water) for 2 hours after your test, or until your numbing medicine wears off. Do not use cigarettes. Do not use e-cigarettes. Get help right away if you have chest pain.  Please call our office for any questions or concerns.  [phone].  This information is not intended to replace advice given to you by your health care provider. Make sure you discuss any questions you have with your health care provider. Document Released: 08/14/2009 Document Revised: 09/29/2017 Document Reviewed: 11/04/2016 Elsevier Patient Education  2020 Elsevier  Inc.  

## 2023-08-10 ENCOUNTER — Telehealth: Payer: Self-pay | Admitting: Emergency Medicine

## 2023-08-10 NOTE — Telephone Encounter (Signed)
Bronchoscopy results pending.  Will call patient when available

## 2023-08-11 ENCOUNTER — Telehealth: Payer: Self-pay | Admitting: Nurse Practitioner

## 2023-08-11 ENCOUNTER — Encounter (HOSPITAL_COMMUNITY): Payer: Self-pay | Admitting: Emergency Medicine

## 2023-08-11 ENCOUNTER — Other Ambulatory Visit: Payer: Self-pay | Admitting: Nurse Practitioner

## 2023-08-11 ENCOUNTER — Ambulatory Visit (HOSPITAL_COMMUNITY)
Admission: RE | Admit: 2023-08-11 | Discharge: 2023-08-11 | Disposition: A | Discharge status: Home / Self Care | Payer: No Typology Code available for payment source | Source: Ambulatory Visit | Attending: Internal Medicine | Admitting: Internal Medicine

## 2023-08-11 DIAGNOSIS — F419 Anxiety disorder, unspecified: Secondary | ICD-10-CM

## 2023-08-11 DIAGNOSIS — C3490 Malignant neoplasm of unspecified part of unspecified bronchus or lung: Secondary | ICD-10-CM | POA: Insufficient documentation

## 2023-08-11 DIAGNOSIS — A419 Sepsis, unspecified organism: Secondary | ICD-10-CM | POA: Diagnosis not present

## 2023-08-11 DIAGNOSIS — R0602 Shortness of breath: Secondary | ICD-10-CM | POA: Diagnosis not present

## 2023-08-11 LAB — GLUCOSE, CAPILLARY: Glucose-Capillary: 118 mg/dL — ABNORMAL HIGH (ref 70–99)

## 2023-08-11 LAB — CYTOLOGY - NON PAP

## 2023-08-11 MED ORDER — DOXYCYCLINE HYCLATE 100 MG PO TABS: 100.0000 mg | ORAL_TABLET | Freq: Two times a day (BID) | ORAL | 0 refills | Status: DC

## 2023-08-11 MED ORDER — FLUDEOXYGLUCOSE F - 18 (FDG) INJECTION
8.9000 | Freq: Once | INTRAVENOUS | Status: AC
  Administered 2023-08-11: 8.9 via INTRAVENOUS

## 2023-08-11 MED ORDER — ALPRAZOLAM 0.5 MG PO TABS
0.5000 mg | ORAL_TABLET | Freq: Two times a day (BID) | ORAL | 0 refills | Status: DC | PRN
Start: 2023-08-11 — End: 2023-09-14

## 2023-08-11 NOTE — Telephone Encounter (Signed)
Call patient to review bronchoscopy results.  Consistent with non-small cell lung cancer, difficult to discern adenocarcinoma versus squamous cell based on staining.  She has follow-up with Dr. Arbutus Ped on 10/24  She is experiencing increased cough, some mucus production.  No longer seeing any blood.  I will treat her for possible bronchitis with doxycycline.  Prescription sent.  She is also having dyspnea and asked her to try using her albuterol with the intent of determining whether she gets any benefit from BD.  She may benefit from longer acting BD therapy going forward

## 2023-08-11 NOTE — Telephone Encounter (Signed)
Patient called to request xanax refill [facility] [address]

## 2023-08-14 ENCOUNTER — Other Ambulatory Visit: Payer: Self-pay

## 2023-08-14 ENCOUNTER — Emergency Department (HOSPITAL_COMMUNITY): Payer: No Typology Code available for payment source

## 2023-08-14 ENCOUNTER — Encounter (HOSPITAL_COMMUNITY): Payer: Self-pay

## 2023-08-14 ENCOUNTER — Inpatient Hospital Stay (HOSPITAL_COMMUNITY)
Admission: EM | Admit: 2023-08-14 | Discharge: 2023-08-17 | DRG: 871 | Disposition: A | Discharge status: Home / Self Care | Payer: No Typology Code available for payment source | Attending: Internal Medicine | Admitting: Internal Medicine

## 2023-08-14 ENCOUNTER — Telehealth: Payer: Self-pay | Admitting: Emergency Medicine

## 2023-08-14 DIAGNOSIS — U071 COVID-19: Secondary | ICD-10-CM | POA: Diagnosis present

## 2023-08-14 DIAGNOSIS — E871 Hypo-osmolality and hyponatremia: Secondary | ICD-10-CM | POA: Diagnosis present

## 2023-08-14 DIAGNOSIS — C3490 Malignant neoplasm of unspecified part of unspecified bronchus or lung: Principal | ICD-10-CM

## 2023-08-14 DIAGNOSIS — C7951 Secondary malignant neoplasm of bone: Secondary | ICD-10-CM | POA: Diagnosis present

## 2023-08-14 DIAGNOSIS — J4520 Mild intermittent asthma, uncomplicated: Secondary | ICD-10-CM | POA: Diagnosis present

## 2023-08-14 DIAGNOSIS — Z881 Allergy status to other antibiotic agents status: Secondary | ICD-10-CM

## 2023-08-14 DIAGNOSIS — I10 Essential (primary) hypertension: Secondary | ICD-10-CM | POA: Diagnosis present

## 2023-08-14 DIAGNOSIS — F17290 Nicotine dependence, other tobacco product, uncomplicated: Secondary | ICD-10-CM | POA: Diagnosis present

## 2023-08-14 DIAGNOSIS — A419 Sepsis, unspecified organism: Principal | ICD-10-CM | POA: Diagnosis present

## 2023-08-14 DIAGNOSIS — Z833 Family history of diabetes mellitus: Secondary | ICD-10-CM | POA: Diagnosis not present

## 2023-08-14 DIAGNOSIS — Z841 Family history of disorders of kidney and ureter: Secondary | ICD-10-CM

## 2023-08-14 DIAGNOSIS — J189 Pneumonia, unspecified organism: Secondary | ICD-10-CM

## 2023-08-14 DIAGNOSIS — E876 Hypokalemia: Secondary | ICD-10-CM | POA: Diagnosis present

## 2023-08-14 DIAGNOSIS — F419 Anxiety disorder, unspecified: Secondary | ICD-10-CM | POA: Diagnosis present

## 2023-08-14 DIAGNOSIS — T380X5A Adverse effect of glucocorticoids and synthetic analogues, initial encounter: Secondary | ICD-10-CM | POA: Diagnosis not present

## 2023-08-14 DIAGNOSIS — R630 Anorexia: Secondary | ICD-10-CM | POA: Diagnosis present

## 2023-08-14 DIAGNOSIS — E785 Hyperlipidemia, unspecified: Secondary | ICD-10-CM | POA: Diagnosis present

## 2023-08-14 DIAGNOSIS — Z791 Long term (current) use of non-steroidal anti-inflammatories (NSAID): Secondary | ICD-10-CM | POA: Diagnosis not present

## 2023-08-14 DIAGNOSIS — R0602 Shortness of breath: Secondary | ICD-10-CM | POA: Diagnosis present

## 2023-08-14 DIAGNOSIS — C3411 Malignant neoplasm of upper lobe, right bronchus or lung: Secondary | ICD-10-CM | POA: Diagnosis present

## 2023-08-14 DIAGNOSIS — Z79899 Other long term (current) drug therapy: Secondary | ICD-10-CM | POA: Diagnosis not present

## 2023-08-14 DIAGNOSIS — Z8249 Family history of ischemic heart disease and other diseases of the circulatory system: Secondary | ICD-10-CM

## 2023-08-14 DIAGNOSIS — Z8616 Personal history of COVID-19: Secondary | ICD-10-CM | POA: Diagnosis not present

## 2023-08-14 DIAGNOSIS — R6510 Systemic inflammatory response syndrome (SIRS) of non-infectious origin without acute organ dysfunction: Secondary | ICD-10-CM | POA: Diagnosis present

## 2023-08-14 DIAGNOSIS — C787 Secondary malignant neoplasm of liver and intrahepatic bile duct: Secondary | ICD-10-CM | POA: Diagnosis present

## 2023-08-14 LAB — CBC WITH DIFFERENTIAL/PLATELET
Abs Immature Granulocytes: 0.1 10*3/uL — ABNORMAL HIGH (ref 0.00–0.07)
Basophils Absolute: 0 10*3/uL (ref 0.0–0.1)
Basophils Relative: 0 %
Eosinophils Absolute: 0 10*3/uL (ref 0.0–0.5)
Eosinophils Relative: 0 %
HCT: 38.9 % (ref 36.0–46.0)
Hemoglobin: 12.3 g/dL (ref 12.0–15.0)
Immature Granulocytes: 1 %
Lymphocytes Relative: 4 %
Lymphs Abs: 0.6 10*3/uL — ABNORMAL LOW (ref 0.7–4.0)
MCH: 30.4 pg (ref 26.0–34.0)
MCHC: 31.6 g/dL (ref 30.0–36.0)
MCV: 96.3 fL (ref 80.0–100.0)
Monocytes Absolute: 1.1 10*3/uL — ABNORMAL HIGH (ref 0.1–1.0)
Monocytes Relative: 6 %
Neutro Abs: 15.8 10*3/uL — ABNORMAL HIGH (ref 1.7–7.7)
Neutrophils Relative %: 89 %
Platelets: 325 10*3/uL (ref 150–400)
RBC: 4.04 MIL/uL (ref 3.87–5.11)
RDW: 14.6 % (ref 11.5–15.5)
WBC: 17.7 10*3/uL — ABNORMAL HIGH (ref 4.0–10.5)
nRBC: 0 % (ref 0.0–0.2)

## 2023-08-14 LAB — LACTIC ACID, PLASMA: Lactic Acid, Venous: 1.2 mmol/L (ref 0.5–1.9)

## 2023-08-14 LAB — BASIC METABOLIC PANEL
Anion gap: 13 (ref 5–15)
BUN: 11 mg/dL (ref 8–23)
CO2: 24 mmol/L (ref 22–32)
Calcium: 9.1 mg/dL (ref 8.9–10.3)
Chloride: 95 mmol/L — ABNORMAL LOW (ref 98–111)
Creatinine, Ser: 0.7 mg/dL (ref 0.44–1.00)
GFR, Estimated: >60 mL/min (ref >60)
Glucose, Bld: 133 mg/dL — ABNORMAL HIGH (ref 70–99)
Potassium: 3.8 mmol/L (ref 3.5–5.1)
Sodium: 132 mmol/L — ABNORMAL LOW (ref 135–145)

## 2023-08-14 LAB — TROPONIN I (HIGH SENSITIVITY)
Troponin I (High Sensitivity): 3 ng/L (ref <18)
Troponin I (High Sensitivity): 3 ng/L (ref <18)

## 2023-08-14 LAB — BRAIN NATRIURETIC PEPTIDE: B Natriuretic Peptide: 36.3 pg/mL (ref 0.0–100.0)

## 2023-08-14 LAB — PROTIME-INR
INR: 1.1 (ref 0.8–1.2)
Prothrombin Time: 14.4 s (ref 11.4–15.2)

## 2023-08-14 LAB — SARS CORONAVIRUS 2 BY RT PCR: SARS Coronavirus 2 by RT PCR: POSITIVE — AB

## 2023-08-14 MED ORDER — TRAZODONE HCL 50 MG PO TABS
25.0000 mg | ORAL_TABLET | Freq: Every evening | ORAL | Status: DC | PRN
  Administered 2023-08-15 – 2023-08-16 (×2): 25 mg via ORAL
  Filled 2023-08-14 (×2): qty 1

## 2023-08-14 MED ORDER — IOHEXOL 350 MG/ML SOLN
75.0000 mL | Freq: Once | INTRAVENOUS | Status: AC | PRN
  Administered 2023-08-14: 75 mL via INTRAVENOUS

## 2023-08-14 MED ORDER — HYDROCHLOROTHIAZIDE 12.5 MG PO TABS
12.5000 mg | ORAL_TABLET | Freq: Every day | ORAL | Status: DC
  Administered 2023-08-15: 12.5 mg via ORAL
  Filled 2023-08-14: qty 1

## 2023-08-14 MED ORDER — ACETAMINOPHEN 325 MG PO TABS: 650.0000 mg | ORAL_TABLET | Freq: Four times a day (QID) | ORAL | Status: DC | PRN

## 2023-08-14 MED ORDER — ONDANSETRON HCL 4 MG PO TABS: 4.0000 mg | ORAL_TABLET | Freq: Four times a day (QID) | ORAL | Status: DC | PRN

## 2023-08-14 MED ORDER — SODIUM CHLORIDE (PF) 0.9 % IJ SOLN
INTRAMUSCULAR | Status: AC
  Filled 2023-08-14: qty 50

## 2023-08-14 MED ORDER — ALPRAZOLAM 0.5 MG PO TABS
0.5000 mg | ORAL_TABLET | Freq: Two times a day (BID) | ORAL | Status: DC | PRN
  Administered 2023-08-14 – 2023-08-16 (×3): 0.5 mg via ORAL
  Filled 2023-08-14 (×3): qty 1

## 2023-08-14 MED ORDER — LOSARTAN POTASSIUM 50 MG PO TABS
50.0000 mg | ORAL_TABLET | Freq: Every day | ORAL | Status: DC
  Filled 2023-08-14: qty 1

## 2023-08-14 MED ORDER — ONDANSETRON HCL 4 MG/2ML IJ SOLN
4.0000 mg | Freq: Four times a day (QID) | INTRAMUSCULAR | Status: DC | PRN
  Administered 2023-08-16: 4 mg via INTRAVENOUS
  Filled 2023-08-14: qty 2

## 2023-08-14 MED ORDER — ALBUTEROL SULFATE (2.5 MG/3ML) 0.083% IN NEBU: 2.5000 mg | INHALATION_SOLUTION | RESPIRATORY_TRACT | Status: DC | PRN

## 2023-08-14 MED ORDER — FENTANYL CITRATE PF 50 MCG/ML IJ SOSY
50.0000 ug | PREFILLED_SYRINGE | Freq: Once | INTRAMUSCULAR | Status: AC
  Administered 2023-08-14: 50 ug via INTRAVENOUS
  Filled 2023-08-14: qty 1

## 2023-08-14 MED ORDER — ONDANSETRON HCL 4 MG/2ML IJ SOLN
4.0000 mg | Freq: Once | INTRAMUSCULAR | Status: AC
  Administered 2023-08-14: 4 mg via INTRAVENOUS
  Filled 2023-08-14: qty 2

## 2023-08-14 MED ORDER — HYDROCODONE-ACETAMINOPHEN 5-325 MG PO TABS
1.0000 | ORAL_TABLET | Freq: Four times a day (QID) | ORAL | Status: DC | PRN
  Administered 2023-08-15 – 2023-08-16 (×2): 1 via ORAL
  Filled 2023-08-14 (×2): qty 1

## 2023-08-14 MED ORDER — PIPERACILLIN-TAZOBACTAM 3.375 G IVPB 30 MIN
3.3750 g | Freq: Once | INTRAVENOUS | Status: AC
  Administered 2023-08-14: 3.375 g via INTRAVENOUS
  Filled 2023-08-14: qty 50

## 2023-08-14 MED ORDER — ENOXAPARIN SODIUM 40 MG/0.4ML IJ SOSY
40.0000 mg | PREFILLED_SYRINGE | INTRAMUSCULAR | Status: DC
  Administered 2023-08-14 – 2023-08-16 (×3): 40 mg via SUBCUTANEOUS
  Filled 2023-08-14 (×3): qty 0.4

## 2023-08-14 MED ORDER — CARMEX CLASSIC LIP BALM EX OINT
1.0000 | TOPICAL_OINTMENT | CUTANEOUS | Status: DC | PRN
  Filled 2023-08-14: qty 10

## 2023-08-14 MED ORDER — LOSARTAN POTASSIUM-HCTZ 50-12.5 MG PO TABS: 1.0000 | ORAL_TABLET | Freq: Every day | ORAL | Status: DC

## 2023-08-14 MED ORDER — ACETAMINOPHEN 650 MG RE SUPP: 650.0000 mg | Freq: Four times a day (QID) | RECTAL | Status: DC | PRN

## 2023-08-14 MED ORDER — PIPERACILLIN-TAZOBACTAM 3.375 G IVPB
3.3750 g | Freq: Three times a day (TID) | INTRAVENOUS | Status: DC
  Administered 2023-08-14 – 2023-08-17 (×8): 3.375 g via INTRAVENOUS
  Filled 2023-08-14 (×8): qty 50

## 2023-08-14 NOTE — Telephone Encounter (Signed)
Spoke with the pt's spouse, Rebecca Ochoa  He states that they called for EMS since the pt has been having cough and increased SOB this am  They just arrived when I was speaking with him  Will forward to Dr Delton Coombes as Lorain Childes

## 2023-08-14 NOTE — Progress Notes (Signed)
Pharmacy Antibiotic Note  Rebecca Ochoa is a 63 y.o. female admitted on 08/14/2023 with  post-obstructive PNA . Pt with recently diagnosed NSCLC, not currently on chemotherapy. COVID+.   Pharmacy has been consulted for piperacillin/tazobactam dosing.  Today, 08/14/23 WBC elevated SCr WNL  Plan: Piperacillin/tazobactam 3.375 g IV q8h  Height: 5\' 5"  (165.1 cm) Weight: 81.2 kg (179 lb) IBW/kg (Calculated) : 57  Temp (24hrs), Avg:98.4 F (36.9 C), Min:98.4 F (36.9 C), Max:98.4 F (36.9 C)  Recent Labs  Lab 08/14/23 0953  WBC 17.7*  CREATININE 0.70    Estimated Creatinine Clearance: 76.8 mL/min (by C-G formula based on SCr of 0.7 mg/dL).    Allergies  Allergen Reactions   Biaxin [Clarithromycin] Nausea Only    Cindi Carbon, PharmD 08/14/2023 4:37 PM

## 2023-08-14 NOTE — ED Notes (Signed)
ED TO INPATIENT HANDOFF REPORT  Name/Age/Gender Rebecca Ochoa 62 y.o. female  Code Status    Code Status Orders  (From admission, onward)           Start     Ordered   08/14/23 1617  Full code  Continuous       Question:  By:  Answer:  Consent: discussion documented in EHR   08/14/23 1617           Code Status History     This patient has a current code status but no historical code status.       Home/SNF/Other Home  Chief Complaint Postobstructive pneumonia [J18.9]  Level of Care/Admitting Diagnosis ED Disposition     ED Disposition  Admit   Condition  --   Comment  Hospital Area: [facility] [[id number]]  Level of Care: Progressive [102]  Admit to Progressive based on following criteria: MULTISYSTEM THREATS such as stable sepsis, metabolic/electrolyte imbalance with or without encephalopathy that is responding to early treatment.  May admit patient to [facility] or [facility] if equivalent level of care is available:: Yes  Covid Evaluation: Confirmed COVID Positive  Diagnosis: Postobstructive pneumonia [656201]  Admitting Physician: Maryln Gottron [[id number]]  Attending Physician: Kirby Crigler, Parks Neptune [[id number]]  Certification:: I certify this patient will need inpatient services for at least 2 midnights  Expected Medical Readiness: 08/17/2023          Medical History Past Medical History:  Diagnosis Date   Anemia    Asthma, mild intermittent, well-controlled    GERD (gastroesophageal reflux disease)    Hx of migraines    Hyperlipidemia    Hypertension    Vitamin D deficiency     Allergies Allergies  Allergen Reactions   Biaxin [Clarithromycin] Nausea Only    IV Location/Drains/Wounds Patient Lines/Drains/Airways Status     Active Line/Drains/Airways     Name Placement date Placement time Site Days   Peripheral IV 08/14/23 20 G 1" Left Antecubital 08/14/23  0953  Antecubital  less than 1             Labs/Imaging Results for orders placed or performed during the hospital encounter of 08/14/23 (from the past 48 hour(s))  Basic metabolic panel     Status: Abnormal   Collection Time: 08/14/23  9:53 AM  Result Value Ref Range   Sodium 132 (L) 135 - 145 mmol/L   Potassium 3.8 3.5 - 5.1 mmol/L    Comment: HEMOLYSIS AT THIS LEVEL MAY AFFECT RESULT   Chloride 95 (L) 98 - 111 mmol/L   CO2 24 22 - 32 mmol/L   Glucose, Bld 133 (H) 70 - 99 mg/dL    Comment: Glucose reference range applies only to samples taken after fasting for at least 8 hours.   BUN 11 8 - 23 mg/dL   Creatinine, Ser 2.13 0.44 - 1.00 mg/dL   Calcium 9.1 8.9 - 08.6 mg/dL   GFR, Estimated >57 >84 mL/min    Comment: (NOTE) Calculated using the CKD-EPI Creatinine Equation (2021)    Anion gap 13 5 - 15    Comment: Performed at [facility], [address], [place], [place] [address]  Brain natriuretic peptide     Status: None   Collection Time: 08/14/23  9:53 AM  Result Value Ref Range   B Natriuretic Peptide 36.3 0.0 - 100.0 pg/mL    Comment: Performed at [facility], [address], [place], [place] [address]  CBC with Differential     Status: Abnormal   Collection Time: 08/14/23  9:53 AM  Result Value Ref Range   WBC 17.7 (H) 4.0 - 10.5 K/uL   RBC 4.04 3.87 - 5.11 MIL/uL   Hemoglobin 12.3 12.0 - 15.0 g/dL   HCT 91.4 78.2 - 95.6 %   MCV 96.3 80.0 - 100.0 fL   MCH 30.4 26.0 - 34.0 pg   MCHC 31.6 30.0 - 36.0 g/dL   RDW 21.3 08.6 - 57.8 %   Platelets 325 150 - 400 K/uL   nRBC 0.0 0.0 - 0.2 %   Neutrophils Relative % 89 %   Neutro Abs 15.8 (H) 1.7 - 7.7 K/uL   Lymphocytes Relative 4 %   Lymphs Abs 0.6 (L) 0.7 - 4.0 K/uL   Monocytes Relative 6 %   Monocytes Absolute 1.1 (H) 0.1 - 1.0 K/uL   Eosinophils Relative 0 %   Eosinophils Absolute 0.0 0.0 - 0.5 K/uL   Basophils Relative 0 %   Basophils Absolute 0.0 0.0 - 0.1 K/uL   Immature Granulocytes 1 %   Abs Immature  Granulocytes 0.10 (H) 0.00 - 0.07 K/uL    Comment: Performed at [facility], [address], [place], [place] [address]  Protime-INR     Status: None   Collection Time: 08/14/23  9:53 AM  Result Value Ref Range   Prothrombin Time 14.4 11.4 - 15.2 seconds   INR 1.1 0.8 - 1.2    Comment: (NOTE) INR goal varies based on device and disease states. Performed at [facility], [address] [place], [place] [address]   Troponin I (High Sensitivity)     Status: None   Collection Time: 08/14/23  9:53 AM  Result Value Ref Range   Troponin I (High Sensitivity) 3 <18 ng/L    Comment: (NOTE) Elevated high sensitivity troponin I (hsTnI) values and significant  changes across serial measurements may suggest ACS but many other  chronic and acute conditions are known to elevate hsTnI results.  Refer to the "Links" section for chest pain algorithms and additional  guidance. Performed at [facility], [address] [place], [place] [address]   SARS Coronavirus 2 by RT PCR (hospital order, performed in [facility] hospital lab) *cepheid single result test* Anterior Nasal Swab     Status: Abnormal   Collection Time: 08/14/23 10:13 AM   Specimen: Anterior Nasal Swab  Result Value Ref Range   SARS Coronavirus 2 by RT PCR POSITIVE (A) NEGATIVE    Comment: (NOTE) SARS-CoV-2 target nucleic acids are DETECTED  SARS-CoV-2 RNA is generally detectable in upper respiratory specimens  during the acute phase of infection.  Positive results are indicative  of the presence of the identified virus, but do not rule out bacterial infection or co-infection with other pathogens not detected by the test.  Clinical correlation with patient history and  other diagnostic information is necessary to determine patient infection status.  The expected result is negative.  Fact Sheet for Patients:   RoadLapTop.co.za   Fact Sheet for  Healthcare Providers:   http://kim-miller.com/    This test is not yet approved or cleared by the [place] FDA and  has been authorized for detection and/or diagnosis of SARS-CoV-2 by FDA under an Emergency Use Authorization (EUA).  This EUA will remain in effect (meaning this test can be used) for the duration of  the COVID-19 declaration under Section 564(b)(1)  of the Act,  21 U.S.C. section 360-bbb-3(b)(1), unless the authorization is terminated or revoked sooner.   Performed at [facility], [address], [place], [place] [address]   Troponin I (High Sensitivity)     Status: None   Collection Time: 08/14/23  2:08 PM  Result Value Ref Range   Troponin I (High Sensitivity) 3 <18 ng/L    Comment: (NOTE) Elevated high sensitivity troponin I (hsTnI) values and significant  changes across serial measurements may suggest ACS but many other  chronic and acute conditions are known to elevate hsTnI results.  Refer to the "Links" section for chest pain algorithms and additional  guidance. Performed at [facility], [address] [place], [place] [address]    CT Angio Chest PE W/Cm &/Or Wo Cm  Result Date: 08/14/2023 CLINICAL DATA:  Pulmonary embolism (PE) suspected. History of lung cancer. EXAM: CT ANGIOGRAPHY CHEST WITH CONTRAST TECHNIQUE: Multidetector CT imaging of the chest was performed using the standard protocol during bolus administration of intravenous contrast. Multiplanar CT image reconstructions and MIPs were obtained to evaluate the vascular anatomy. RADIATION DOSE REDUCTION: This exam was performed according to the departmental dose-optimization program which includes automated exposure control, adjustment of the mA and/or kV according to patient size and/or use of iterative reconstruction technique. CONTRAST:  75mL OMNIPAQUE IOHEXOL 350 MG/ML SOLN COMPARISON:  CT chest dated August 08, 2023. FINDINGS:  Cardiovascular: Satisfactory opacification of the pulmonary arteries to the segmental level. No evidence of pulmonary embolism. There is narrowing of the right main and interlobar pulmonary arteries and compression of the right upper and middle lobe segmental arterial branches secondary to the large necrotic right hilar nodal mass, described below. Normal heart size. Multivessel coronary artery calcifications. Atherosclerotic calcification of the thoracic aorta. No pericardial effusion. Mediastinum/Nodes: Redemonstration of a large necrotic nodal mass involving the right hilum and invading the mediastinum, similar to the prior exam. The esophagus is grossly unremarkable. Lungs/Pleura: Essentially stable large right upper lobe lung mass measuring approximately 5.6 x 4.7 cm, variations in measurement may be attributable to differences in slice selection. Innumerable bilateral pulmonary metastatic lesions are again noted. There are new patchy ground-glass and consolidative changes in the right mid lung (series 13, image 53). There is similar compression of the bronchus intermedius secondary to the large necrotic nodal mass of the right hilum. No pneumothorax. Upper Abdomen: Unchanged large left hepatic heterogenous hypodense lesion measuring up to 7 cm. Otherwise, no acute findings within the visualized upper abdomen. Musculoskeletal: No suspicious osseous lesion. Review of the MIP images confirms the above findings. IMPRESSION: 1. No evidence of acute pulmonary embolism. 2. New patchy ground-glass and consolidative changes in the right mid lung may relate to a postobstructive infectious/inflammatory etiology secondary to the known large right hilar nodal mass. 3. Otherwise, no significant change compared to the prior examination dated August 08, 2023 with redemonstration of a dominant right upper lobe lung mass and large necrotic right hilar nodal mass invading the mediastinum. There is associated narrowing of the  right main and interlobar pulmonary arteries and compression of the right upper and middle lobe segmental arterial branches. 4. Innumerable bilateral pulmonary metastatic lesions. 5. Unchanged large left hepatic lobe lesion, concerning for metastatic disease. 6.  Aortic Atherosclerosis (ICD10-I70.0). Electronically Signed   By: Hart Robinsons M.D.   On: 08/14/2023 14:48   DG Chest Port 1 View  Result Date: 08/14/2023 CLINICAL DATA:  History of lung cancer with shortness of breath EXAM: PORTABLE CHEST 1 VIEW COMPARISON:  X-ray 07/21/2019, CT 08/08/2023 FINDINGS: Underinflation with the elevated right hemidiaphragm. Multiple bilateral mass lesions are again seen, right-greater-than-left. Several are increased compared to the prior x-ray. Please correlate with more recent CT examination. No pneumothorax or effusion. Basilar atelectasis. Normal cardiopericardial silhouette. Calcified aorta. IMPRESSION: Multiple bilateral lung masses, increased compared to the prior x-ray. There also new elevation of the right hemidiaphragm. Please correlate with the more recent CT examinations. Electronically Signed   By: Karen Kays M.D.   On: 08/14/2023 10:41    Pending Labs Unresulted Labs (From admission, onward)     Start     Ordered   08/15/23 0500  Basic metabolic panel  Tomorrow morning,   R        08/14/23 1617   08/15/23 0500  CBC  Tomorrow morning,   R        08/14/23 1617   08/14/23 1557  Lactic acid, plasma  (Lactic Acid)  STAT Now then every 3 hours,   R (with STAT occurrences)      08/14/23 1556   08/14/23 1548  Culture, blood (routine x 2)  BLOOD CULTURE X 2,   R (with STAT occurrences)      08/14/23 1548            Vitals/Pain Today's Vitals   08/14/23 1020 08/14/23 1242 08/14/23 1413  BP:  119/76   Pulse:  (!) 112   Resp:  (!) 22   Temp:  98.4 F (36.9 C)   TempSrc:  Oral   SpO2:  92%   Weight: 81.2 kg    Height: 5\' 5"  (1.651 m)    PainSc:   5     Isolation  Precautions Airborne and Contact precautions  Medications Medications  piperacillin-tazobactam (ZOSYN) IVPB 3.375 g (3.375 g Intravenous New Bag/Given 08/14/23 1629)  HYDROcodone-acetaminophen (NORCO/VICODIN) 5-325 MG per tablet 1 tablet (has no administration in time range)  losartan-hydrochlorothiazide (HYZAAR) 50-12.5 MG per tablet 1 tablet (has no administration in time range)  ALPRAZolam (XANAX) tablet 0.5 mg (has no administration in time range)  enoxaparin (LOVENOX) injection 40 mg (has no administration in time range)  acetaminophen (TYLENOL) tablet 650 mg (has no administration in time range)    Or  acetaminophen (TYLENOL) suppository 650 mg (has no administration in time range)  traZODone (DESYREL) tablet 25 mg (has no administration in time range)  ondansetron (ZOFRAN) tablet 4 mg (has no administration in time range)    Or  ondansetron (ZOFRAN) injection 4 mg (has no administration in time range)  albuterol (PROVENTIL) (2.5 MG/3ML) 0.083% nebulizer solution 2.5 mg (has no administration in time range)  piperacillin-tazobactam (ZOSYN) IVPB 3.375 g (has no administration in time range)  ondansetron (ZOFRAN) injection 4 mg (4 mg Intravenous Given 08/14/23 1006)  fentaNYL (SUBLIMAZE) injection 50 mcg (50 mcg Intravenous Given 08/14/23 1006)  iohexol (OMNIPAQUE) 350 MG/ML injection 75 mL (75 mLs Intravenous Contrast Given 08/14/23 1122)    Mobility walks

## 2023-08-14 NOTE — ED Triage Notes (Signed)
Pt BIBA from home with c/o SOB. Dx with lung cancer 08/08/2023. Yesterday having chest wall pain, hurts when taking a deep breath, dizziness today while at work. No LOC. Placed on 2L for comfort.   118/60 HR 108 RR 22 97% RA CBG 150

## 2023-08-14 NOTE — Telephone Encounter (Signed)
I will message the ED physician to update and check on her status

## 2023-08-14 NOTE — ED Provider Notes (Signed)
[place] EMERGENCY DEPARTMENT AT [facility] Provider Note   CSN: [id number] Arrival date & time: 08/14/23  0920     History  Chief Complaint  Patient presents with   Shortness of Breath   Dizziness    Rebecca Ochoa is a 62 y.o. female.  She has a history of asthma.  She was recently worked up for a chest mass and had an endobronchial biopsy done for 5 days ago.  Concern for cancer.  She said she started with a cough after the biopsy and Dr. Delton Coombes pulmonology put her on some antibiotics.  Cough been productive of initially a little blood-tinged sputum but now more yellow.  Starting last night she has had right upper central chest pain especially with cough and deep breathing.  Kept her up most of the night.  Today she was at work and felt very lightheaded like she was going to pass out needed to lay down, was diaphoretic.  They called the ambulance and brought her in here for further evaluation.  She does not think she has had a fever.  No prior cardiac disease.  She is not on any blood thinners.  The history is provided by the patient.  Shortness of Breath Severity:  Moderate Onset quality:  Gradual Duration:  1 week Timing:  Constant Progression:  Worsening Chronicity:  New Relieved by:  Nothing Worsened by:  Activity and coughing Ineffective treatments:  Position changes and rest Associated symptoms: chest pain, cough, diaphoresis, hemoptysis and sputum production   Associated symptoms: no abdominal pain, no fever and no wheezing   Dizziness Associated symptoms: chest pain and shortness of breath        Home Medications Prior to Admission medications   Medication Sig Start Date End Date Taking? Authorizing Provider  acetaminophen (TYLENOL) 500 MG tablet Take 1,000 mg by mouth every 8 (eight) hours as needed for mild pain.    [provider]  albuterol (VENTOLIN HFA) 108 (90 Base) MCG/ACT inhaler Inhale 2 puffs into the lungs every 6 (six) hours as  needed for wheezing or shortness of breath. 05/30/23   Raynelle Dick, NP  ALPRAZolam Prudy Feeler) 0.5 MG tablet Take 1 tablet (0.5 mg total) by mouth 2 (two) times daily as needed for anxiety. 08/11/23   Raynelle Dick, NP  doxycycline (VIBRA-TABS) 100 MG tablet Take 1 tablet (100 mg total) by mouth 2 (two) times daily. 08/11/23   Leslye Peer, MD  Fluticasone Propionate (FLONASE NA) Place into the nose. Patient not taking: Reported on 08/04/2023    [provider]  HYDROcodone-acetaminophen (NORCO/VICODIN) 5-325 MG tablet Take 1 tablet by mouth every 6 (six) hours as needed. 07/21/23   Roemhildt, Lorin T, PA-C  lidocaine (LIDODERM) 5 % Place 1 patch onto the skin every 12 (twelve) hours. Remove & Discard patch within 12 hours or as directed by MD 07/04/23 07/03/24  Raynelle Dick, NP  losartan-hydrochlorothiazide (HYZAAR) 50-12.5 MG tablet TAKE 1 TABLET BY MOUTH DAILY 07/10/23 07/09/24  Raynelle Dick, NP  meloxicam (MOBIC) 15 MG tablet Take 1/2 to 1 tablet  Daily  with Food  for Pain & Inflammation 07/04/23   Raynelle Dick, NP  bisoprolol-hydrochlorothiazide [facility]) 5-6.25 MG tablet Take  1 tablet  Daily  for BP 06/06/22 07/11/22  [name], MD      Allergies    Biaxin [clarithromycin]    Review of Systems   Review of Systems  Constitutional:  Positive for diaphoresis and fatigue. Negative  for fever.  Eyes:  Negative for visual disturbance.  Respiratory:  Positive for cough, hemoptysis, sputum production and shortness of breath. Negative for wheezing.   Cardiovascular:  Positive for chest pain.  Gastrointestinal:  Negative for abdominal pain.  Neurological:  Positive for dizziness.    Physical Exam Updated Vital Signs BP 119/76 (BP Location: Left Arm)   Pulse (!) 112   Temp 98.6 F (37 C) (Oral)   Resp (!) 22   Ht 5\' 5"  (1.651 m)   Wt 81.2 kg   SpO2 92%   BMI 29.79 kg/m  Physical Exam Vitals and nursing note reviewed.  Constitutional:      General: She is not  in acute distress.    Appearance: She is well-developed.  HENT:     Head: Normocephalic and atraumatic.  Eyes:     Conjunctiva/sclera: Conjunctivae normal.  Cardiovascular:     Rate and Rhythm: Regular rhythm. Tachycardia present.     Heart sounds: No murmur heard. Pulmonary:     Effort: Pulmonary effort is normal. No respiratory distress.     Breath sounds: Normal breath sounds.  Abdominal:     Palpations: Abdomen is soft.     Tenderness: There is no abdominal tenderness.  Musculoskeletal:        General: No swelling.     Cervical back: Neck supple.     Right lower leg: No tenderness. No edema.     Left lower leg: No tenderness. No edema.  Skin:    General: Skin is warm and dry.     Capillary Refill: Capillary refill takes less than 2 seconds.  Neurological:     General: No focal deficit present.     Mental Status: She is alert.     ED Results / Procedures / Treatments   Labs (all labs ordered are listed, but only abnormal results are displayed) Labs Reviewed  SARS CORONAVIRUS 2 BY RT PCR - Abnormal; Notable for the following components:      Result Value   SARS Coronavirus 2 by RT PCR POSITIVE (*)    All other components within normal limits  BASIC METABOLIC PANEL - Abnormal; Notable for the following components:   Sodium 132 (*)    Chloride 95 (*)    Glucose, Bld 133 (*)    All other components within normal limits  CBC WITH DIFFERENTIAL/PLATELET - Abnormal; Notable for the following components:   WBC 17.7 (*)    Neutro Abs 15.8 (*)    Lymphs Abs 0.6 (*)    Monocytes Absolute 1.1 (*)    Abs Immature Granulocytes 0.10 (*)    All other components within normal limits  CULTURE, BLOOD (ROUTINE X 2)  CULTURE, BLOOD (ROUTINE X 2)  BRAIN NATRIURETIC PEPTIDE  PROTIME-INR  LACTIC ACID, PLASMA  LACTIC ACID, PLASMA  BASIC METABOLIC PANEL  CBC  TROPONIN I (HIGH SENSITIVITY)  TROPONIN I (HIGH SENSITIVITY)    EKG EKG Interpretation Date/Time:  Monday August 14 2023 10:20:43 EDT Ventricular Rate:  106 PR Interval:  153 QRS Duration:  69 QT Interval:  322 QTC Calculation: 428 R Axis:   54  Text Interpretation: Sinus tachycardia LAE, consider biatrial enlargement COPY Confirmed by Meridee Score [phone]) on 08/14/2023 10:31:51 AM  Radiology CT Angio Chest PE W/Cm &/Or Wo Cm  Result Date: 08/14/2023 CLINICAL DATA:  Pulmonary embolism (PE) suspected. History of lung cancer. EXAM: CT ANGIOGRAPHY CHEST WITH CONTRAST TECHNIQUE: Multidetector CT imaging of the chest was performed using the standard protocol  during bolus administration of intravenous contrast. Multiplanar CT image reconstructions and MIPs were obtained to evaluate the vascular anatomy. RADIATION DOSE REDUCTION: This exam was performed according to the departmental dose-optimization program which includes automated exposure control, adjustment of the mA and/or kV according to patient size and/or use of iterative reconstruction technique. CONTRAST:  75mL OMNIPAQUE IOHEXOL 350 MG/ML SOLN COMPARISON:  CT chest dated August 08, 2023. FINDINGS: Cardiovascular: Satisfactory opacification of the pulmonary arteries to the segmental level. No evidence of pulmonary embolism. There is narrowing of the right main and interlobar pulmonary arteries and compression of the right upper and middle lobe segmental arterial branches secondary to the large necrotic right hilar nodal mass, described below. Normal heart size. Multivessel coronary artery calcifications. Atherosclerotic calcification of the thoracic aorta. No pericardial effusion. Mediastinum/Nodes: Redemonstration of a large necrotic nodal mass involving the right hilum and invading the mediastinum, similar to the prior exam. The esophagus is grossly unremarkable. Lungs/Pleura: Essentially stable large right upper lobe lung mass measuring approximately 5.6 x 4.7 cm, variations in measurement may be attributable to differences in slice selection. Innumerable  bilateral pulmonary metastatic lesions are again noted. There are new patchy ground-glass and consolidative changes in the right mid lung (series 13, image 53). There is similar compression of the bronchus intermedius secondary to the large necrotic nodal mass of the right hilum. No pneumothorax. Upper Abdomen: Unchanged large left hepatic heterogenous hypodense lesion measuring up to 7 cm. Otherwise, no acute findings within the visualized upper abdomen. Musculoskeletal: No suspicious osseous lesion. Review of the MIP images confirms the above findings. IMPRESSION: 1. No evidence of acute pulmonary embolism. 2. New patchy ground-glass and consolidative changes in the right mid lung may relate to a postobstructive infectious/inflammatory etiology secondary to the known large right hilar nodal mass. 3. Otherwise, no significant change compared to the prior examination dated August 08, 2023 with redemonstration of a dominant right upper lobe lung mass and large necrotic right hilar nodal mass invading the mediastinum. There is associated narrowing of the right main and interlobar pulmonary arteries and compression of the right upper and middle lobe segmental arterial branches. 4. Innumerable bilateral pulmonary metastatic lesions. 5. Unchanged large left hepatic lobe lesion, concerning for metastatic disease. 6.  Aortic Atherosclerosis (ICD10-I70.0). Electronically Signed   By: Hart Robinsons M.D.   On: 08/14/2023 14:48   DG Chest Port 1 View  Result Date: 08/14/2023 CLINICAL DATA:  History of lung cancer with shortness of breath EXAM: PORTABLE CHEST 1 VIEW COMPARISON:  X-ray 07/21/2019, CT 08/08/2023 FINDINGS: Underinflation with the elevated right hemidiaphragm. Multiple bilateral mass lesions are again seen, right-greater-than-left. Several are increased compared to the prior x-ray. Please correlate with more recent CT examination. No pneumothorax or effusion. Basilar atelectasis. Normal cardiopericardial  silhouette. Calcified aorta. IMPRESSION: Multiple bilateral lung masses, increased compared to the prior x-ray. There also new elevation of the right hemidiaphragm. Please correlate with the more recent CT examinations. Electronically Signed   By: Karen Kays M.D.   On: 08/14/2023 10:41    Procedures .Critical Care  Performed by: Terrilee Files, MD Authorized by: Terrilee Files, MD   Critical care provider statement:    Critical care time (minutes):  45   Critical care time was exclusive of:  Separately billable procedures and treating other patients   Critical care was necessary to treat or prevent imminent or life-threatening deterioration of the following conditions:  Respiratory failure   Critical care was time spent personally by me on  the following activities:  Development of treatment plan with patient or surrogate, discussions with consultants, evaluation of patient's response to treatment, examination of patient, obtaining history from patient or surrogate, ordering and performing treatments and interventions, ordering and review of laboratory studies, ordering and review of radiographic studies, pulse oximetry, re-evaluation of patient's condition and review of old charts   I assumed direction of critical care for this patient from another provider in my specialty: no       Medications Ordered in ED Medications  HYDROcodone-acetaminophen (NORCO/VICODIN) 5-325 MG per tablet 1 tablet (has no administration in time range)  ALPRAZolam (XANAX) tablet 0.5 mg (has no administration in time range)  enoxaparin (LOVENOX) injection 40 mg (has no administration in time range)  acetaminophen (TYLENOL) tablet 650 mg (has no administration in time range)    Or  acetaminophen (TYLENOL) suppository 650 mg (has no administration in time range)  traZODone (DESYREL) tablet 25 mg (has no administration in time range)  ondansetron (ZOFRAN) tablet 4 mg (has no administration in time range)    Or   ondansetron (ZOFRAN) injection 4 mg (has no administration in time range)  albuterol (PROVENTIL) (2.5 MG/3ML) 0.083% nebulizer solution 2.5 mg (has no administration in time range)  piperacillin-tazobactam (ZOSYN) IVPB 3.375 g (has no administration in time range)  losartan (COZAAR) tablet 50 mg (has no administration in time range)    And  hydrochlorothiazide (HYDRODIURIL) tablet 12.5 mg (has no administration in time range)  ondansetron (ZOFRAN) injection 4 mg (4 mg Intravenous Given 08/14/23 1006)  fentaNYL (SUBLIMAZE) injection 50 mcg (50 mcg Intravenous Given 08/14/23 1006)  iohexol (OMNIPAQUE) 350 MG/ML injection 75 mL (75 mLs Intravenous Contrast Given 08/14/23 1122)  piperacillin-tazobactam (ZOSYN) IVPB 3.375 g (0 g Intravenous Stopped 08/14/23 1725)    ED Course/ Medical Decision Making/ A&P Clinical Course as of 08/14/23 1743  Mon Aug 14, 2023  1007 Patient with worsening right sided chest masses.  Elevation of right hemidiaphragm.  Awaiting radiology reading. [MB]  1056 EKG did not cross in epic.  Sinus tachycardia rate of 106 no significant change from last month. [MB]  1548 Discussed with [facility] hospitalist Dr. Erenest Blank.  He will evaluate patient for admission.  He does recommend antibiotics and cultures.  These have been ordered. [MB]    Clinical Course User Index [MB] Terrilee Files, MD                                 Medical Decision Making Amount and/or Complexity of Data Reviewed Labs: ordered. Radiology: ordered.  Risk Prescription drug management. Decision regarding hospitalization.   This patient complains of cough chest pain near syncope shortness of breath; this involves an extensive number of treatment Options and is a complaint that carries with it a high risk of complications and morbidity. The differential includes PE, pneumothorax, pneumonia, ACS  I ordered, reviewed and interpreted labs, which included CBC with elevated white count, chemistries  fairly unremarkable, COVID-positive, troponins flat, lactate and blood culture sent I ordered medication IV pain medicine IV antibiotics and reviewed PMP when indicated. I ordered imaging studies which included chest x-ray and CT angio chest and I independently    visualized and interpreted imaging which showed no PE but does have progressive lung findings Additional history obtained from patient's spouse Previous records obtained and reviewed in epic including recent pulmonology notes I consulted [facility] hospitalist Dr. Erenest Blank and discussed lab and imaging findings  and discussed disposition.  Also updated Dr. Delton Coombes pulmonology Cardiac monitoring reviewed, sinus tachycardia Social determinants considered, tobacco use Critical Interventions: Initiation of broad-spectrum antibiotics and complex imaging for patient's shortness of breath chest pain  After the interventions stated above, I reevaluated the patient and found patient still to be symptomatic tachycardic and tachypneic. Admission and further testing considered, she would benefit from mission to the hospital for further treatment.  Patient in agreement with plan for admission.         Final Clinical Impression(s) / ED Diagnoses Final diagnoses:  Non-small cell lung cancer, unspecified laterality (HCC)  Postobstructive pneumonia  COVID-19 virus infection    Rx / DC Orders ED Discharge Orders     None         Terrilee Files, MD 08/14/23 1746

## 2023-08-14 NOTE — Telephone Encounter (Signed)
Patient states having symptoms of cough and shortness of breath. No available appointments at this time. Pharmacy is [facility] [address] Patient phone number is [phone].

## 2023-08-14 NOTE — H&P (Signed)
History and Physical  Rebecca Ochoa ZOX:[id number] DOB: Sep 03, 1961 DOA: 08/14/2023  PCP: [name], MD   Chief Complaint: Right-sided chest pain, increased cough, shortness of breath  HPI: Rebecca Ochoa is a 62 y.o. female with medical history significant for hypertension, hyperlipidemia, smoking and very recent diagnosis of non-small cell lung cancer not yet on therapy being admitted to the hospital with worsening cough, chest pain, shortness of breath found to have COVID infection and possible postobstructive pneumonia.  She had bronchoscopy with endobronchial ultrasound and biopsies with Dr. Delton Coombes on 10/8.  Since that time, she has had some dyspnea with exertion, increased cough.  Denies coughing up any blood.  Dr. Delton Coombes called in p.o. doxycycline 10/11 for possible bronchitis.  However, she has failed to improve, in fact since yesterday she has had worsening right-sided chest pain with cough and deep breaths.  Denies any fevers or chills.  Denies any sick contacts.  ED Course: On evaluation in the emergency department, she was initially tachycardic and tachypneic, those have resolved.  She is saturating well on room air.  17.7, negative troponin, negative BNP.  Sodium 132, normal renal function.  Review of Systems: Please see HPI for pertinent positives and negatives. A complete 10 system review of systems are otherwise negative.  Past Medical History:  Diagnosis Date   Anemia    Asthma, mild intermittent, well-controlled    GERD (gastroesophageal reflux disease)    Hx of migraines    Hyperlipidemia    Hypertension    Vitamin D deficiency    Past Surgical History:  Procedure Laterality Date   BREAST SURGERY     reduction   BRONCHIAL BIOPSY  08/08/2023   Procedure: BRONCHIAL BIOPSIES;  Surgeon: Leslye Peer, MD;  Location: [facility] ENDOSCOPY;  Service: Pulmonary;;   BRONCHIAL BRUSHINGS  08/08/2023   Procedure: BRONCHIAL BRUSHINGS;  Surgeon: Leslye Peer, MD;  Location: [facility]  ENDOSCOPY;  Service: Pulmonary;;   BRONCHIAL NEEDLE ASPIRATION BIOPSY  08/08/2023   Procedure: BRONCHIAL NEEDLE ASPIRATION BIOPSIES;  Surgeon: Leslye Peer, MD;  Location: [facility] ENDOSCOPY;  Service: Pulmonary;;   ENDOBRONCHIAL ULTRASOUND Bilateral 08/08/2023   Procedure: ENDOBRONCHIAL ULTRASOUND;  Surgeon: Leslye Peer, MD;  Location: [facility] ENDOSCOPY;  Service: Pulmonary;  Laterality: Bilateral;   HEMOSTASIS CONTROL  08/08/2023   Procedure: HEMOSTASIS CONTROL;  Surgeon: Leslye Peer, MD;  Location: [facility] ENDOSCOPY;  Service: Pulmonary;;   REDUCTION MAMMAPLASTY     TUBAL LIGATION      Social History:  reports that she has been smoking e-cigarettes. She has never used smokeless tobacco. She reports current alcohol use of about 3.0 standard drinks of alcohol per week. She reports that she does not use drugs.   Allergies  Allergen Reactions   Biaxin [Clarithromycin] Nausea Only    Family History  Problem Relation Age of Onset   Heart disease Father    Hypertension Father    Diabetes Father    Kidney disease Father      Prior to Admission medications   Medication Sig Start Date End Date Taking? Authorizing Provider  acetaminophen (TYLENOL) 500 MG tablet Take 1,000 mg by mouth every 8 (eight) hours as needed for mild pain.    [provider]  albuterol (VENTOLIN HFA) 108 (90 Base) MCG/ACT inhaler Inhale 2 puffs into the lungs every 6 (six) hours as needed for wheezing or shortness of breath. 05/30/23   Raynelle Dick, NP  ALPRAZolam Prudy Feeler) 0.5 MG tablet Take 1 tablet (0.5 mg total) by mouth  2 (two) times daily as needed for anxiety. 08/11/23   Raynelle Dick, NP  doxycycline (VIBRA-TABS) 100 MG tablet Take 1 tablet (100 mg total) by mouth 2 (two) times daily. 08/11/23   Leslye Peer, MD  Fluticasone Propionate (FLONASE NA) Place into the nose. Patient not taking: Reported on 08/04/2023    [provider]  HYDROcodone-acetaminophen (NORCO/VICODIN) 5-325 MG tablet  Take 1 tablet by mouth every 6 (six) hours as needed. 07/21/23   Roemhildt, Lorin T, PA-C  lidocaine (LIDODERM) 5 % Place 1 patch onto the skin every 12 (twelve) hours. Remove & Discard patch within 12 hours or as directed by MD 07/04/23 07/03/24  Raynelle Dick, NP  losartan-hydrochlorothiazide [facility]) 50-12.5 MG tablet TAKE 1 TABLET BY MOUTH DAILY 07/10/23 07/09/24  Raynelle Dick, NP  meloxicam (MOBIC) 15 MG tablet Take 1/2 to 1 tablet  Daily  with Food  for Pain & Inflammation 07/04/23   Raynelle Dick, NP  bisoprolol-hydrochlorothiazide [facility]) 5-6.25 MG tablet Take  1 tablet  Daily  for BP 06/06/22 07/11/22  [name], MD    Physical Exam: BP 119/76 (BP Location: Left Arm)   Pulse (!) 112   Temp 98.4 F (36.9 C) (Oral)   Resp (!) 22   Ht 5\' 5"  (1.651 m)   Wt 81.2 kg   SpO2 92%   BMI 29.79 kg/m   General:  Alert, oriented, calm, looks anxious but not in any acute distress, her husband is at the bedside Eyes: EOMI, clear conjuctivae, white sclerea Neck: supple, no masses, trachea mildline  Cardiovascular: RRR, no murmurs or rubs, no peripheral edema  Respiratory: Good bilateral chest rise, breathing comfortably without any tachypnea, audible rhonchi or stridor Abdomen: soft, nontender, nondistended, normal bowel tones heard  Skin: dry, no rashes  Musculoskeletal: no joint effusions, normal range of motion  Psychiatric: appropriate affect, normal speech  Neurologic: extraocular muscles intact, clear speech, moving all extremities with intact sensorium         Labs on Admission:  Basic Metabolic Panel: Recent Labs  Lab 08/14/23 0953  NA 132*  K 3.8  CL 95*  CO2 24  GLUCOSE 133*  BUN 11  CREATININE 0.70  CALCIUM 9.1   Liver Function Tests: No results for input(s): "AST", "ALT", "ALKPHOS", "BILITOT", "PROT", "ALBUMIN" in the last 168 hours. No results for input(s): "LIPASE", "AMYLASE" in the last 168 hours. No results for input(s): "AMMONIA" in the last 168  hours. CBC: Recent Labs  Lab 08/14/23 0953  WBC 17.7*  NEUTROABS 15.8*  HGB 12.3  HCT 38.9  MCV 96.3  PLT 325   Cardiac Enzymes: No results for input(s): "CKTOTAL", "CKMB", "CKMBINDEX", "TROPONINI" in the last 168 hours.  BNP (last 3 results) Recent Labs    08/14/23 0953  BNP 36.3    ProBNP (last 3 results) No results for input(s): "PROBNP" in the last 8760 hours.  CBG: Recent Labs  Lab 08/11/23 1056  GLUCAP 118*    Radiological Exams on Admission: CT Angio Chest PE W/Cm &/Or Wo Cm  Result Date: 08/14/2023 CLINICAL DATA:  Pulmonary embolism (PE) suspected. History of lung cancer. EXAM: CT ANGIOGRAPHY CHEST WITH CONTRAST TECHNIQUE: Multidetector CT imaging of the chest was performed using the standard protocol during bolus administration of intravenous contrast. Multiplanar CT image reconstructions and MIPs were obtained to evaluate the vascular anatomy. RADIATION DOSE REDUCTION: This exam was performed according to the departmental dose-optimization program which includes automated exposure control, adjustment of the mA and/or  kV according to patient size and/or use of iterative reconstruction technique. CONTRAST:  75mL OMNIPAQUE IOHEXOL 350 MG/ML SOLN COMPARISON:  CT chest dated August 08, 2023. FINDINGS: Cardiovascular: Satisfactory opacification of the pulmonary arteries to the segmental level. No evidence of pulmonary embolism. There is narrowing of the right main and interlobar pulmonary arteries and compression of the right upper and middle lobe segmental arterial branches secondary to the large necrotic right hilar nodal mass, described below. Normal heart size. Multivessel coronary artery calcifications. Atherosclerotic calcification of the thoracic aorta. No pericardial effusion. Mediastinum/Nodes: Redemonstration of a large necrotic nodal mass involving the right hilum and invading the mediastinum, similar to the prior exam. The esophagus is grossly unremarkable.  Lungs/Pleura: Essentially stable large right upper lobe lung mass measuring approximately 5.6 x 4.7 cm, variations in measurement may be attributable to differences in slice selection. Innumerable bilateral pulmonary metastatic lesions are again noted. There are new patchy ground-glass and consolidative changes in the right mid lung (series 13, image 53). There is similar compression of the bronchus intermedius secondary to the large necrotic nodal mass of the right hilum. No pneumothorax. Upper Abdomen: Unchanged large left hepatic heterogenous hypodense lesion measuring up to 7 cm. Otherwise, no acute findings within the visualized upper abdomen. Musculoskeletal: No suspicious osseous lesion. Review of the MIP images confirms the above findings. IMPRESSION: 1. No evidence of acute pulmonary embolism. 2. New patchy ground-glass and consolidative changes in the right mid lung may relate to a postobstructive infectious/inflammatory etiology secondary to the known large right hilar nodal mass. 3. Otherwise, no significant change compared to the prior examination dated August 08, 2023 with redemonstration of a dominant right upper lobe lung mass and large necrotic right hilar nodal mass invading the mediastinum. There is associated narrowing of the right main and interlobar pulmonary arteries and compression of the right upper and middle lobe segmental arterial branches. 4. Innumerable bilateral pulmonary metastatic lesions. 5. Unchanged large left hepatic lobe lesion, concerning for metastatic disease. 6.  Aortic Atherosclerosis (ICD10-I70.0). Electronically Signed   By: Hart Robinsons M.D.   On: 08/14/2023 14:48   DG Chest Port 1 View  Result Date: 08/14/2023 CLINICAL DATA:  History of lung cancer with shortness of breath EXAM: PORTABLE CHEST 1 VIEW COMPARISON:  X-ray 07/21/2019, CT 08/08/2023 FINDINGS: Underinflation with the elevated right hemidiaphragm. Multiple bilateral mass lesions are again seen,  right-greater-than-left. Several are increased compared to the prior x-ray. Please correlate with more recent CT examination. No pneumothorax or effusion. Basilar atelectasis. Normal cardiopericardial silhouette. Calcified aorta. IMPRESSION: Multiple bilateral lung masses, increased compared to the prior x-ray. There also new elevation of the right hemidiaphragm. Please correlate with the more recent CT examinations. Electronically Signed   By: Karen Kays M.D.   On: 08/14/2023 10:41    Assessment/Plan Rebecca Ochoa is a 62 y.o. female with medical history significant for hypertension, hyperlipidemia, smoking and very recent diagnosis of non-small cell lung cancer not yet on therapy being admitted to the hospital with worsening cough, chest pain, shortness of breath found to have COVID infection and possible postobstructive pneumonia.  Sepsis-meeting criteria with tachycardia, tachypnea, and suspected postobstructive pneumonia. -Check lactate -Treat suspected pneumonia as below  Suspected postobstructive pneumonia-suspected due to increased cough and dyspnea, leukocytosis, and consolidative changes in the right midlung.  While these could easily be due to inflammation from her malignancy and recent biopsy, this is difficult to rule out. -Inpatient admission to progressive -Empiric IV Zosyn -Monitor vitals closely, supplemental oxygen if needed  Leukocytosis-likely due to active malignancy and possible bacterial infection as above  Non-small cell lung cancer-with likely liver metastases -Discussed with Dr. Roselind Messier of radiation oncology, will see the patient tomorrow and likely start treatment planning -Dr. Arbutus Ped added to treatment team as well  COVID infection-unclear what role this is playing, she is not hypoxic and does not qualify for any COVID-specific therapy  Hypertension-continue home medication  DVT prophylaxis: Lovenox     Code Status: Full Code  Consults called: Discussed with  Dr. Roselind Messier radiation oncology, Dr. Arbutus Ped added to treatment team  Admission status: The appropriate patient status for this patient is INPATIENT. Inpatient status is judged to be reasonable and necessary in order to provide the required intensity of service to ensure the patient's safety. The patient's presenting symptoms, physical exam findings, and initial radiographic and laboratory data in the context of their chronic comorbidities is felt to place them at high risk for further clinical deterioration. Furthermore, it is not anticipated that the patient will be medically stable for discharge from the hospital within 2 midnights of admission.    I certify that at the point of admission it is my clinical judgment that the patient will require inpatient hospital care spanning beyond 2 midnights from the point of admission due to high intensity of service, high risk for further deterioration and high frequency of surveillance required  Time spent: 59 minutes  Rebecca Hypolite Sharlette Dense MD [facility] Pager [phone]  If 7PM-7AM, please contact night-coverage [email or web address] Password [facility]  08/14/2023, 4:23 PM

## 2023-08-15 ENCOUNTER — Ambulatory Visit
Admission: RE | Admit: 2023-08-15 | Discharge: 2023-08-15 | Disposition: A | Discharge status: Home / Self Care | Payer: No Typology Code available for payment source | Source: Ambulatory Visit | Attending: Radiation Oncology | Admitting: Radiation Oncology

## 2023-08-15 ENCOUNTER — Ambulatory Visit
Admit: 2023-08-15 | Discharge: 2023-08-15 | Disposition: A | Discharge status: Home / Self Care | Payer: No Typology Code available for payment source | Attending: Radiation Oncology | Admitting: Radiation Oncology

## 2023-08-15 ENCOUNTER — Encounter (HOSPITAL_COMMUNITY): Payer: Self-pay | Admitting: Internal Medicine

## 2023-08-15 DIAGNOSIS — I10 Essential (primary) hypertension: Secondary | ICD-10-CM | POA: Diagnosis present

## 2023-08-15 DIAGNOSIS — Z510 Encounter for antineoplastic radiation therapy: Secondary | ICD-10-CM | POA: Insufficient documentation

## 2023-08-15 DIAGNOSIS — C3491 Malignant neoplasm of unspecified part of right bronchus or lung: Secondary | ICD-10-CM

## 2023-08-15 DIAGNOSIS — C7951 Secondary malignant neoplasm of bone: Secondary | ICD-10-CM | POA: Insufficient documentation

## 2023-08-15 DIAGNOSIS — U071 COVID-19: Secondary | ICD-10-CM | POA: Diagnosis present

## 2023-08-15 DIAGNOSIS — A419 Sepsis, unspecified organism: Secondary | ICD-10-CM | POA: Diagnosis present

## 2023-08-15 DIAGNOSIS — R6510 Systemic inflammatory response syndrome (SIRS) of non-infectious origin without acute organ dysfunction: Secondary | ICD-10-CM | POA: Diagnosis present

## 2023-08-15 DIAGNOSIS — J189 Pneumonia, unspecified organism: Secondary | ICD-10-CM | POA: Diagnosis not present

## 2023-08-15 DIAGNOSIS — C787 Secondary malignant neoplasm of liver and intrahepatic bile duct: Secondary | ICD-10-CM | POA: Insufficient documentation

## 2023-08-15 DIAGNOSIS — C3490 Malignant neoplasm of unspecified part of unspecified bronchus or lung: Secondary | ICD-10-CM | POA: Diagnosis not present

## 2023-08-15 DIAGNOSIS — C3411 Malignant neoplasm of upper lobe, right bronchus or lung: Secondary | ICD-10-CM | POA: Insufficient documentation

## 2023-08-15 LAB — CYTOLOGY - NON PAP

## 2023-08-15 LAB — BASIC METABOLIC PANEL
Anion gap: 9 (ref 5–15)
BUN: 13 mg/dL (ref 8–23)
CO2: 27 mmol/L (ref 22–32)
Calcium: 8.8 mg/dL — ABNORMAL LOW (ref 8.9–10.3)
Chloride: 94 mmol/L — ABNORMAL LOW (ref 98–111)
Creatinine, Ser: 1.01 mg/dL — ABNORMAL HIGH (ref 0.44–1.00)
GFR, Estimated: >60 mL/min (ref >60)
Glucose, Bld: 139 mg/dL — ABNORMAL HIGH (ref 70–99)
Potassium: 3.4 mmol/L — ABNORMAL LOW (ref 3.5–5.1)
Sodium: 130 mmol/L — ABNORMAL LOW (ref 135–145)

## 2023-08-15 LAB — CBC
HCT: 33.6 % — ABNORMAL LOW (ref 36.0–46.0)
Hemoglobin: 10.7 g/dL — ABNORMAL LOW (ref 12.0–15.0)
MCH: 30.3 pg (ref 26.0–34.0)
MCHC: 31.8 g/dL (ref 30.0–36.0)
MCV: 95.2 fL (ref 80.0–100.0)
Platelets: 277 10*3/uL (ref 150–400)
RBC: 3.53 MIL/uL — ABNORMAL LOW (ref 3.87–5.11)
RDW: 14.8 % (ref 11.5–15.5)
WBC: 14.7 10*3/uL — ABNORMAL HIGH (ref 4.0–10.5)
nRBC: 0 % (ref 0.0–0.2)

## 2023-08-15 MED ORDER — METHYLPREDNISOLONE SODIUM SUCC 40 MG IJ SOLR
40.0000 mg | Freq: Two times a day (BID) | INTRAMUSCULAR | Status: DC
  Administered 2023-08-15 – 2023-08-16 (×3): 40 mg via INTRAVENOUS
  Filled 2023-08-15 (×3): qty 1

## 2023-08-15 MED ORDER — HYDROCODONE BIT-HOMATROP MBR 5-1.5 MG/5ML PO SOLN
5.0000 mL | ORAL | Status: DC | PRN
  Administered 2023-08-15 – 2023-08-16 (×2): 5 mL via ORAL
  Filled 2023-08-15 (×2): qty 5

## 2023-08-15 MED ORDER — GUAIFENESIN-DM 100-10 MG/5ML PO SYRP
5.0000 mL | ORAL_SOLUTION | ORAL | Status: DC | PRN
  Administered 2023-08-15 (×2): 5 mL via ORAL
  Filled 2023-08-15 (×2): qty 10

## 2023-08-15 NOTE — Progress Notes (Signed)
Pt states "it feels like there is something in my throat, like a lump when I swallow". Pt states that it is not painful, but uncomfortable. Pt states that she informed ED staff. Writer notified provider. No new orders at this time.

## 2023-08-15 NOTE — Progress Notes (Signed)
[facility]                                                                              Rebecca Ochoa, is a 62 y.o. female, DOB - Mar 07, 1961, ZOX:[id number] Admit date - 08/14/2023    Outpatient Primary MD for the patient is [name], MD  LOS - 1  days  Chief Complaint  Patient presents with   Shortness of Breath   Dizziness       Brief summary   Patient is a 62 year old female with HTN, HLP, smoking, recent diagnosis of non-small cell lung CA on 08/08/23 not yet on chemotherapy presented with worsening cough, chest pain, shortness of breath found to have COVID infection and possible postobstructive pneumonia. She had bronchoscopy with endobronchial ultrasound and biopsies with Dr. Delton Coombes on 10/8. Since that time, she has had some dyspnea with exertion, increased cough.  Denied any hemoptysis, fevers or chills.  Dr. Delton Coombes had called in p.o. doxycycline 10/11 for possible bronchitis. However, she has failed to improve.  Reported worsening right-sided pleuritic chest pain.   Assessment & Plan    Principal Problem:   Sepsis (HCC), POA secondary to postobstructive pneumonia -Met criteria for sepsis due to tachycardia, tachypnea, leukocytosis, suspected postobstructive pneumonia -Continue IV Zosyn, leukocytosis improving -Follow blood cultures -CTA chest showed no PE, new patchy groundglass and consolidative changes in the right midlung likely related to postobstructive infectious/inflammatory etiology  Active Problems:   COVID-19 virus infection, likely incidental -Currently no hypoxia, not started on COVID-specific therapy -Monitor for any fevers, hypoxia.  Continue airborne precautions     Non-small cell lung cancer (HCC), metastatic - CTA chest showed postobstructive pneumonia, right upper lobe lung mass and large necrotic right hilar nodal mass invading the mediastinum, bilateral pulmonary metastatic lesions, large left hepatic lobe lesion concerning for  metastatic disease -Oncology and radiation oncology consulted, plan for starting XRT today    Essential hypertension -BP stable hold losartan/hydrochlorothiazide  Anxiety -Continue Xanax as needed   Estimated body mass index is 28.76 kg/m as calculated from the following:   Height as of this encounter: 5\' 5"  (1.651 m).   Weight as of this encounter: 78.4 kg.  Code Status: Full code DVT Prophylaxis:  enoxaparin (LOVENOX) injection 40 mg Start: 08/14/23 2200 SCDs Start: 08/14/23 1617   Level of Care: Level of care: Progressive Family Communication: Updated patient's partner at the bedside Disposition Plan:      Remains inpatient appropriate:      Procedures:    Consultants:   Oncology Radiation oncology  Antimicrobials:   Anti-infectives (From admission, onward)    Start     Dose/Rate Route Frequency Ordered Stop   08/14/23 2200  piperacillin-tazobactam (ZOSYN) IVPB 3.375 g        3.375 g 12.5 mL/hr over 240 Minutes Intravenous Every 8 hours 08/14/23 1647     08/14/23 1600  piperacillin-tazobactam (ZOSYN) IVPB 3.375 g        3.375 g 100 mL/hr over 30 Minutes Intravenous  Once 08/14/23 1548 08/14/23 1725          Medications  enoxaparin (LOVENOX) injection  40 mg Subcutaneous Q24H  losartan  50 mg Oral Daily   And   hydrochlorothiazide  12.5 mg Oral Daily   methylPREDNISolone (SOLU-MEDROL) injection  40 mg Intravenous BID      Subjective:   Rebecca Ochoa was seen and examined today.  No acute complaints, no fevers or chills, has cough.  No acute chest pain, nausea vomiting, abdominal pain.   Objective:   Vitals:   08/14/23 1730 08/14/23 2239 08/15/23 0651 08/15/23 1135  BP:  111/73 101/65 128/65  Pulse:  (!) 110  (!) 106  Resp:  20  18  Temp: 98.6 F (37 C) 98.4 F (36.9 C) 98.6 F (37 C) 98.6 F (37 C)  TempSrc: Oral Oral Oral Oral  SpO2:  95% 94% 94%  Weight:  78.4 kg    Height:  5\' 5"  (1.651 m)      Intake/Output Summary (Last 24  hours) at 08/15/2023 1149 Last data filed at 08/15/2023 0300 Gross per 24 hour  Intake 289.12 ml  Output --  Net 289.12 ml     Wt Readings from Last 3 Encounters:  08/14/23 78.4 kg  08/08/23 81.2 kg  08/02/23 81.8 kg     Exam General: Alert and oriented x 3, NAD Cardiovascular: S1 S2 auscultated,  RRR Respiratory: Diminished breath sounds at the bases, no wheezing Gastrointestinal: Soft, nontender, nondistended, + bowel sounds Ext: no pedal edema bilaterally Neuro: no new deficits  skin: No rashes Psych: Normal affect     Data Reviewed:  I have personally reviewed following labs    CBC Lab Results  Component Value Date   WBC 14.7 (H) 08/15/2023   RBC 3.53 (L) 08/15/2023   HGB 10.7 (L) 08/15/2023   HCT 33.6 (L) 08/15/2023   MCV 95.2 08/15/2023   MCH 30.3 08/15/2023   PLT 277 08/15/2023   MCHC 31.8 08/15/2023   RDW 14.8 08/15/2023   LYMPHSABS 0.6 (L) 08/14/2023   MONOABS 1.1 (H) 08/14/2023   EOSABS 0.0 08/14/2023   BASOSABS 0.0 08/14/2023     Last metabolic panel Lab Results  Component Value Date   NA 130 (L) 08/15/2023   K 3.4 (L) 08/15/2023   CL 94 (L) 08/15/2023   CO2 27 08/15/2023   BUN 13 08/15/2023   CREATININE 1.01 (H) 08/15/2023   GLUCOSE 139 (H) 08/15/2023   GFRNONAA >60 08/15/2023   GFRAA 95 10/12/2020   CALCIUM 8.8 (L) 08/15/2023   PROT 7.4 07/31/2023   ALBUMIN 4.0 07/31/2023   BILITOT 0.5 07/31/2023   ALKPHOS 107 07/31/2023   AST 32 07/31/2023   ALT 19 07/31/2023   ANIONGAP 9 08/15/2023    CBG (last 3)  No results for input(s): "GLUCAP" in the last 72 hours.    Coagulation Profile: Recent Labs  Lab 08/14/23 0953  INR 1.1     Radiology Studies: I have personally reviewed the imaging studies  CT Angio Chest PE W/Cm &/Or Wo Cm  Result Date: 08/14/2023 CLINICAL DATA:  Pulmonary embolism (PE) suspected. History of lung cancer. EXAM: CT ANGIOGRAPHY CHEST WITH CONTRAST TECHNIQUE: Multidetector CT imaging of the chest was  performed using the standard protocol during bolus administration of intravenous contrast. Multiplanar CT image reconstructions and MIPs were obtained to evaluate the vascular anatomy. RADIATION DOSE REDUCTION: This exam was performed according to the departmental dose-optimization program which includes automated exposure control, adjustment of the mA and/or kV according to patient size and/or use of iterative reconstruction technique. CONTRAST:  75mL OMNIPAQUE IOHEXOL 350 MG/ML SOLN COMPARISON:  CT chest dated  August 08, 2023. FINDINGS: Cardiovascular: Satisfactory opacification of the pulmonary arteries to the segmental level. No evidence of pulmonary embolism. There is narrowing of the right main and interlobar pulmonary arteries and compression of the right upper and middle lobe segmental arterial branches secondary to the large necrotic right hilar nodal mass, described below. Normal heart size. Multivessel coronary artery calcifications. Atherosclerotic calcification of the thoracic aorta. No pericardial effusion. Mediastinum/Nodes: Redemonstration of a large necrotic nodal mass involving the right hilum and invading the mediastinum, similar to the prior exam. The esophagus is grossly unremarkable. Lungs/Pleura: Essentially stable large right upper lobe lung mass measuring approximately 5.6 x 4.7 cm, variations in measurement may be attributable to differences in slice selection. Innumerable bilateral pulmonary metastatic lesions are again noted. There are new patchy ground-glass and consolidative changes in the right mid lung (series 13, image 53). There is similar compression of the bronchus intermedius secondary to the large necrotic nodal mass of the right hilum. No pneumothorax. Upper Abdomen: Unchanged large left hepatic heterogenous hypodense lesion measuring up to 7 cm. Otherwise, no acute findings within the visualized upper abdomen. Musculoskeletal: No suspicious osseous lesion. Review of the MIP  images confirms the above findings. IMPRESSION: 1. No evidence of acute pulmonary embolism. 2. New patchy ground-glass and consolidative changes in the right mid lung may relate to a postobstructive infectious/inflammatory etiology secondary to the known large right hilar nodal mass. 3. Otherwise, no significant change compared to the prior examination dated August 08, 2023 with redemonstration of a dominant right upper lobe lung mass and large necrotic right hilar nodal mass invading the mediastinum. There is associated narrowing of the right main and interlobar pulmonary arteries and compression of the right upper and middle lobe segmental arterial branches. 4. Innumerable bilateral pulmonary metastatic lesions. 5. Unchanged large left hepatic lobe lesion, concerning for metastatic disease. 6.  Aortic Atherosclerosis (ICD10-I70.0). Electronically Signed   By: Hart Robinsons M.D.   On: 08/14/2023 14:48   DG Chest Port 1 View  Result Date: 08/14/2023 CLINICAL DATA:  History of lung cancer with shortness of breath EXAM: PORTABLE CHEST 1 VIEW COMPARISON:  X-ray 07/21/2019, CT 08/08/2023 FINDINGS: Underinflation with the elevated right hemidiaphragm. Multiple bilateral mass lesions are again seen, right-greater-than-left. Several are increased compared to the prior x-ray. Please correlate with more recent CT examination. No pneumothorax or effusion. Basilar atelectasis. Normal cardiopericardial silhouette. Calcified aorta. IMPRESSION: Multiple bilateral lung masses, increased compared to the prior x-ray. There also new elevation of the right hemidiaphragm. Please correlate with the more recent CT examinations. Electronically Signed   By: Karen Kays M.D.   On: 08/14/2023 10:41       Mutasim Tuckey M.D. [facility] 08/15/2023, 11:49 AM  Available via Epic secure chat 7am-7pm After 7 pm, please refer to night coverage provider listed on amion.

## 2023-08-15 NOTE — Progress Notes (Signed)
Pharmacy Antibiotic Note  Rebecca Ochoa is a 62 y.o. female admitted on 08/14/2023 with pneumonia.  Pharmacy has been consulted for Zosyn dosing.  ID: PNA Afebrile, WBC 14.7 (on steroids), Scr 1  10/14 Zosyn>>  10/14: COVID +  Plan: Zosyn 3.375g IV q8h (4 hour infusion). Dose ok for renal function. Pharmacy will sign off. Please reconsult for further dosing assitance.    Height: 5\' 5"  (165.1 cm) Weight: 78.4 kg (172 lb 13.5 oz) IBW/kg (Calculated) : 57  Temp (24hrs), Avg:98.5 F (36.9 C), Min:98.4 F (36.9 C), Max:98.6 F (37 C)  Recent Labs  Lab 08/14/23 0953 08/14/23 1908 08/15/23 0414  WBC 17.7*  --  14.7*  CREATININE 0.70  --  1.01*  LATICACIDVEN  --  1.2  --     Estimated Creatinine Clearance: 59.8 mL/min (A) (by C-G formula based on SCr of 1.01 mg/dL (H)).    Allergies  Allergen Reactions   Biaxin [Clarithromycin] Nausea Only    [place] Delauder S. Merilynn Finland, PharmD, BCPS Clinical Staff Pharmacist [email or web address]  Pasty Spillers 08/15/2023 8:24 AM

## 2023-08-15 NOTE — Progress Notes (Signed)
Subjective: The patient, with a recent diagnosis of non-small cell lung cancer, likely squamous cell carcinoma, presented with worsening symptoms following a bronchoscopic biopsy performed a week prior. She reported an increase in coughing and shortness of breath, along with a general feeling of weakness. These symptoms gradually worsened over time. The patient also experienced episodes of excessive heat and sweating, but no fevers were reported.  The patient's condition deteriorated significantly, leading to an incident at work where she was unable to return from the restroom without resting due to severe shortness of breath and chest pain. This incident prompted her admission to the hospital.  The patient also reported occasional headaches, but denied any changes in vision, nausea, vomiting, diarrhea, or leg weakness. The patient's condition is complicated by the presence of a large mass in the right upper lobe of the lung, lymph node invasion into the mediastinum, and multiple nodules in both lungs, as well as liver and bone metastases.  Objective: Vital signs in last 24 hours: Temp:  [98.4 F (36.9 C)-98.6 F (37 C)] 98.6 F (37 C) (10/15 0651) Pulse Rate:  [110-112] 110 (10/14 2239) Resp:  [20-22] 20 (10/14 2239) BP: (101-119)/(65-76) 101/65 (10/15 0651) SpO2:  [92 %-95 %] 94 % (10/15 0651) Weight:  [172 lb 13.5 oz (78.4 kg)-179 lb (81.2 kg)] 172 lb 13.5 oz (78.4 kg) (10/14 2239)  Intake/Output from previous day: 10/14 0701 - 10/15 0700 In: 289.1 [P.O.:240; IV Piggyback:49.1] Out: -  Intake/Output this shift: No intake/output data recorded.  General appearance: alert, cooperative, fatigued, and mild distress Resp: rales bilaterally and rhonchi bilaterally Cardio: regular rate and rhythm, S1, S2 normal, no murmur, click, rub or gallop GI: soft, non-tender; bowel sounds normal; no masses,  no organomegaly Extremities: extremities normal, atraumatic, no cyanosis or edema  Lab  Results:  Recent Labs    08/14/23 0953 08/15/23 0414  WBC 17.7* 14.7*  HGB 12.3 10.7*  HCT 38.9 33.6*  PLT 325 277   BMET Recent Labs    08/14/23 0953 08/15/23 0414  NA 132* 130*  K 3.8 3.4*  CL 95* 94*  CO2 24 27  GLUCOSE 133* 139*  BUN 11 13  CREATININE 0.70 1.01*  CALCIUM 9.1 8.8*    Studies/Results: CT Angio Chest PE W/Cm &/Or Wo Cm  Result Date: 08/14/2023 CLINICAL DATA:  Pulmonary embolism (PE) suspected. History of lung cancer. EXAM: CT ANGIOGRAPHY CHEST WITH CONTRAST TECHNIQUE: Multidetector CT imaging of the chest was performed using the standard protocol during bolus administration of intravenous contrast. Multiplanar CT image reconstructions and MIPs were obtained to evaluate the vascular anatomy. RADIATION DOSE REDUCTION: This exam was performed according to the departmental dose-optimization program which includes automated exposure control, adjustment of the mA and/or kV according to patient size and/or use of iterative reconstruction technique. CONTRAST:  75mL OMNIPAQUE IOHEXOL 350 MG/ML SOLN COMPARISON:  CT chest dated August 08, 2023. FINDINGS: Cardiovascular: Satisfactory opacification of the pulmonary arteries to the segmental level. No evidence of pulmonary embolism. There is narrowing of the right main and interlobar pulmonary arteries and compression of the right upper and middle lobe segmental arterial branches secondary to the large necrotic right hilar nodal mass, described below. Normal heart size. Multivessel coronary artery calcifications. Atherosclerotic calcification of the thoracic aorta. No pericardial effusion. Mediastinum/Nodes: Redemonstration of a large necrotic nodal mass involving the right hilum and invading the mediastinum, similar to the prior exam. The esophagus is grossly unremarkable. Lungs/Pleura: Essentially stable large right upper lobe lung mass measuring approximately 5.6  x 4.7 cm, variations in measurement may be attributable to  differences in slice selection. Innumerable bilateral pulmonary metastatic lesions are again noted. There are new patchy ground-glass and consolidative changes in the right mid lung (series 13, image 53). There is similar compression of the bronchus intermedius secondary to the large necrotic nodal mass of the right hilum. No pneumothorax. Upper Abdomen: Unchanged large left hepatic heterogenous hypodense lesion measuring up to 7 cm. Otherwise, no acute findings within the visualized upper abdomen. Musculoskeletal: No suspicious osseous lesion. Review of the MIP images confirms the above findings. IMPRESSION: 1. No evidence of acute pulmonary embolism. 2. New patchy ground-glass and consolidative changes in the right mid lung may relate to a postobstructive infectious/inflammatory etiology secondary to the known large right hilar nodal mass. 3. Otherwise, no significant change compared to the prior examination dated August 08, 2023 with redemonstration of a dominant right upper lobe lung mass and large necrotic right hilar nodal mass invading the mediastinum. There is associated narrowing of the right main and interlobar pulmonary arteries and compression of the right upper and middle lobe segmental arterial branches. 4. Innumerable bilateral pulmonary metastatic lesions. 5. Unchanged large left hepatic lobe lesion, concerning for metastatic disease. 6.  Aortic Atherosclerosis (ICD10-I70.0). Electronically Signed   By: Hart Robinsons M.D.   On: 08/14/2023 14:48   DG Chest Port 1 View  Result Date: 08/14/2023 CLINICAL DATA:  History of lung cancer with shortness of breath EXAM: PORTABLE CHEST 1 VIEW COMPARISON:  X-ray 07/21/2019, CT 08/08/2023 FINDINGS: Underinflation with the elevated right hemidiaphragm. Multiple bilateral mass lesions are again seen, right-greater-than-left. Several are increased compared to the prior x-ray. Please correlate with more recent CT examination. No pneumothorax or effusion.  Basilar atelectasis. Normal cardiopericardial silhouette. Calcified aorta. IMPRESSION: Multiple bilateral lung masses, increased compared to the prior x-ray. There also new elevation of the right hemidiaphragm. Please correlate with the more recent CT examinations. Electronically Signed   By: Karen Kays M.D.   On: 08/14/2023 10:41    Medications: I have reviewed the patient's current medications.  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 Disease is extensive with lung, mediastinal lymph node, liver, and bone involvement. Patient symptomatic with cough, shortness of breath, and fatigue. -Initiate palliative radiation to large obstructing lung mass to improve symptoms. -Plan for outpatient chemotherapy and immunotherapy once patient is stabilized and discharged. -Send biopsy tissue for molecular and genetic studies to identify potential driver mutations. -Start steroids to reduce tumor-associated swelling and potentially improve cough. -Change cough medication to better manage symptoms. -For the postobstructive pneumonia continue current treatment with Zosyn. General Health Maintenance -Plan for outpatient follow-up appointment as soon as possible after discharge to discuss treatment plan and next steps.   LOS: 1 day    Rebecca Ochoa 08/15/2023

## 2023-08-15 NOTE — Plan of Care (Signed)

## 2023-08-15 NOTE — Progress Notes (Signed)
Radiation Oncology         [phone] ________________________________  Initial Inpatient Consultation  Name: Rebecca Ochoa MRN: [id number]  Date: 08/15/2023  DOB: Dec 29, 1960  CC:Rebecca Cowboy, MD  Maryln Gottron, MD   REFERRING PHYSICIAN: Kirby Crigler, Mir M, MD  DIAGNOSIS: The encounter diagnosis was Non-small cell cancer of right lung [facility]).  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 female who is accompanied by her supportive husband. she is seen as a courtesy of Dr. Kirby Crigler for an opinion concerning radiation therapy as part of management for her recently diagnosed right lung cancer. She has a history notable for smoking since the age of 71, vaping use for the past 10 years, and daily alcohol consumption.   She initially presented to an urgent care on 07/21/23 with several days of chest pain with inspiration with radiating pain up her back. She also endorsed an intermittent cough since having COVID one month prior, and progressive fatigue since her chest pain started. A chest x-ray was performed which showed new nodular lesions and masses in the right hilum, the right upper lobe, and right upper lung field.   Subsequently, she was sent to the [facility] ED that day for further evaluation and management. Chest CT performed in the ED showed: numerous bilateral pulmonary nodules consistent with metastatic disease; a dominant right apical partially cavitary mass abutting the pleural surface, either representing primary lung neoplasm versus a metastatic focus; a lobulated right hilar mass with narrowing of the pulmonary vessels; several hypodense liver masses favoring metastatic disease; and several indeterminate splenic lesions concerning for potential metastatic  disease.   She was then referred to Dr. Arbutus Ped and Dr. Delton Coombes who both recommended proceeding with biopsies and staging work-up for treatment planning.    A super-D chest CT was subsequently performed on 08/08/23 which showed a stable RUL lung mass  measuring approximately 5.8 x 5.4 cm with associated large necrotic mass on adenopathy involving the right hilum and invading the mediastinum. CT also demonstrated innumerable enlarging metastatic lesions in both lungs, and a large left hepatic lesion measuring approximately 7 cm.   She also underwent bronchoscopy with biopsies on 08/08/23 under the care of Dr. Delton Coombes. Cytology results are detailed as follows:   -- Biopsy of the RUL lesion and brushings of the bronchus intermedius both showed findings consistent with non-small cell carcinoma.  -- FNA of the right hilar mass showed atypical cells present. -- FNA of lymph node 4R showed atypical cells present  -- FNA of a station 7 lymph node showed malignant cells present.    PET scan on 08/11/23 showed hypermetabolism associated with the 5.5 cm RUL mass and large necrotic right hilar/mediastinal mass; innumerable hypermetabolic pulmonary metastatic lesions; hepatic metastatic disease; a hypermetabolic celiac axis lymph node; and two small hypermetabolic bone lesions in the right scapula and L4 vertebral body are suspicious for osseous metastatic disease.  Dr. Delton Coombes called in p.o. doxycycline for her on 10/11 for possible bronchitis based on progressive SOB since her procedure earlier that day.   Current admission:   She presented to the ED yesterday with c/o worsening cough, chest pain, shortness of breath. She also reports having worsening exertional dyspnea and right sided chest pain with coughing and deep inhalation since her bronchoscopy. Upon arrival to the ED, she  was found to be tachycardic and tachypneic, and tested positive for COVID.   CTA of the chest performed yesterday shows: new patchy  ground-glass and consolidative changes in the right mid lung, possibly related to a postobstructive infectious/inflammatory etiology secondary to the known large right hilar nodal mass. Imaging otherwise showed no significant interval change of the RUL mass, right hilar nodal mass, bilateral pulmonary metastatic lesions, and left hepatic lobe lesion.   -- Of note: Chest x-ray performed yesterday showed an increase in the bilateral lung masses when compared to her prior chest x-ray. New elevation of the right hemidiaphragm was also demonstrated.   She was subsequently admitted in the setting of her recent diagnosis and progressive symptoms. Hospital course thus far has included Empiric IV Zosyn for postobstructive PNA.   She was seen at bedside by Dr. Arbutus Ped today who has recommended palliative radiation to the large obstructing lung mass to help improve her symptoms. Once her symptoms are stable enough for discharge, Dr. Arbutus Ped would like to initiate chemotherapy and immunotherapy pending molecular and genetic studies to assess for actionable mutations.   Dr. Arbutus Ped has also recommended steroids to help reduce the tumor-associated swelling and potentially help improve her cough.   PREVIOUS RADIATION THERAPY: No  PAST MEDICAL HISTORY:  Past Medical History:  Diagnosis Date   Anemia    Asthma, mild intermittent, well-controlled    GERD (gastroesophageal reflux disease)    Hx of migraines    Hyperlipidemia    Hypertension    Vitamin D deficiency     PAST SURGICAL HISTORY: Past Surgical History:  Procedure Laterality Date   BREAST SURGERY     reduction   BRONCHIAL BIOPSY  08/08/2023   Procedure: BRONCHIAL BIOPSIES;  Surgeon: Leslye Peer, MD;  Location: [facility] ENDOSCOPY;  Service: Pulmonary;;   BRONCHIAL BRUSHINGS  08/08/2023   Procedure: BRONCHIAL BRUSHINGS;  Surgeon: Leslye Peer, MD;  Location: [facility] ENDOSCOPY;  Service: Pulmonary;;   BRONCHIAL NEEDLE ASPIRATION BIOPSY  08/08/2023    Procedure: BRONCHIAL NEEDLE ASPIRATION BIOPSIES;  Surgeon: Leslye Peer, MD;  Location: [facility] ENDOSCOPY;  Service: Pulmonary;;   ENDOBRONCHIAL ULTRASOUND Bilateral 08/08/2023   Procedure: ENDOBRONCHIAL ULTRASOUND;  Surgeon: Leslye Peer, MD;  Location: [facility] ENDOSCOPY;  Service: Pulmonary;  Laterality: Bilateral;   HEMOSTASIS CONTROL  08/08/2023   Procedure: HEMOSTASIS CONTROL;  Surgeon: Leslye Peer, MD;  Location: [facility] ENDOSCOPY;  Service: Pulmonary;;   REDUCTION MAMMAPLASTY     TUBAL LIGATION      FAMILY HISTORY:  Family History  Problem Relation Age of Onset   Heart disease Father    Hypertension Father    Diabetes Father    Kidney disease Father     SOCIAL HISTORY:  Social History   Tobacco Use   Smoking status: Every Day    Types: E-cigarettes   Smokeless tobacco: Never  Vaping Use   Vaping status: Every Day  Substance Use Topics   Alcohol use: Yes    Alcohol/week: 3.0 standard drinks of alcohol    Types: 3 Standard drinks or equivalent per week   Drug use: Never    ALLERGIES:  Allergies  Allergen Reactions   Biaxin [Clarithromycin] Nausea Only    MEDICATIONS:  No current facility-administered medications for this encounter.   No current outpatient medications on file.   Facility-Administered Medications Ordered in Other Encounters  Medication Dose Route Frequency Provider Last Rate Last Admin   acetaminophen (TYLENOL) tablet 650 mg  650 mg Oral Q6H PRN Kirby Crigler, Mir  M, MD       Or   acetaminophen (TYLENOL) suppository 650 mg  650 mg Rectal Q6H PRN Kirby Crigler, Mir M, MD       albuterol (PROVENTIL) (2.5 MG/3ML) 0.083% nebulizer solution 2.5 mg  2.5 mg Nebulization Q2H PRN Kirby Crigler, Mir M, MD       ALPRAZolam Prudy Feeler) tablet 0.5 mg  0.5 mg Oral BID PRN Kirby Crigler, Mir M, MD   0.5 mg at 08/14/23 2243   enoxaparin (LOVENOX) injection 40 mg  40 mg Subcutaneous Q24H Kirby Crigler, Mir M, MD   40 mg at 08/14/23 2259   guaiFENesin-dextromethorphan (ROBITUSSIN DM)  100-10 MG/5ML syrup 5 mL  5 mL Oral Q4H PRN Luiz Iron, NP   5 mL at 08/15/23 0657   HYDROcodone bit-homatropine (HYCODAN) 5-1.5 MG/5ML syrup 5 mL  5 mL Oral Q4H PRN Si Gaul, MD       HYDROcodone-acetaminophen (NORCO/VICODIN) 5-325 MG per tablet 1 tablet  1 tablet Oral Q6H PRN Kirby Crigler, Mir M, MD       lip balm (CARMEX) ointment 1 Application  1 Application Topical PRN Luiz Iron, NP       methylPREDNISolone sodium succinate (SOLU-MEDROL) 40 mg/mL injection 40 mg  40 mg Intravenous BID Si Gaul, MD   40 mg at 08/15/23 1037   ondansetron (ZOFRAN) tablet 4 mg  4 mg Oral Q6H PRN Kirby Crigler, Mir M, MD       Or   ondansetron [facility]) injection 4 mg  4 mg Intravenous Q6H PRN Kirby Crigler, Mir M, MD       piperacillin-tazobactam (ZOSYN) IVPB 3.375 g  3.375 g Intravenous Q8H Cindi Carbon, RPH 12.5 mL/hr at 08/15/23 1409 3.375 g at 08/15/23 1409   traZODone (DESYREL) tablet 25 mg  25 mg Oral QHS PRN Maryln Gottron, MD        REVIEW OF SYSTEMS:  Patient endorses a dry cough whenever she talks. She denies any productive cough or hemoptysis. At rest, she is not short of breath. However, she does experience dyspnea with mild exertion. She is not on any oxygen at home. She has been feeling very fatigued and endorses right sided chest pain.    PHYSICAL EXAM:  In general this is a fatigued female in mild distress. She's alert and oriented x4 and appropriate throughout the examination. Cardiopulmonary assessment is negative for acute distress and she exhibits normal effort.      ECOG = 3  0 - Asymptomatic (Fully active, able to carry on all predisease activities without restriction)  1 - Symptomatic but completely ambulatory (Restricted in physically strenuous activity but ambulatory and able to carry out work of a light or sedentary nature. For example, light housework, office work)  2 - Symptomatic, <50% in bed during the day (Ambulatory and capable of all self care but  unable to carry out any work activities. Up and about more than 50% of waking hours)  3 - Symptomatic, >50% in bed, but not bedbound (Capable of only limited self-care, confined to bed or chair 50% or more of waking hours)  4 - Bedbound (Completely disabled. Cannot carry on any self-care. Totally confined to bed or chair)  5 - Death   Santiago Glad MM, Creech RH, Tormey DC, et al. [phone]). "Toxicity and response criteria of the [facility]". Am. Evlyn Clines. Oncol. 5 (6): 649-55  LABORATORY DATA:  Lab Results  Component Value Date   WBC 14.7 (H) 08/15/2023   HGB 10.7 (L) 08/15/2023  HCT 33.6 (L) 08/15/2023   MCV 95.2 08/15/2023   PLT 277 08/15/2023   NEUTROABS 15.8 (H) 08/14/2023   Lab Results  Component Value Date   NA 130 (L) 08/15/2023   K 3.4 (L) 08/15/2023   CL 94 (L) 08/15/2023   CO2 27 08/15/2023   GLUCOSE 139 (H) 08/15/2023   BUN 13 08/15/2023   CREATININE 1.01 (H) 08/15/2023   CALCIUM 8.8 (L) 08/15/2023      RADIOGRAPHY: NM PET Image Initial (PI) Skull Base To Thigh (F-18 FDG)  Result Date: 08/14/2023 CLINICAL DATA:  Initial treatment strategy for non-small cell lung cancer. EXAM: NUCLEAR MEDICINE PET SKULL BASE TO THIGH TECHNIQUE: 8.90 mCi F-18 FDG was injected intravenously. Full-ring PET imaging was performed from the skull base to thigh after the radiotracer. CT data was obtained and used for attenuation correction and anatomic localization. Fasting blood glucose: 118 mg/dl COMPARISON:  Chest CT 78/29/5621 FINDINGS: Mediastinal blood pool activity: SUV max 2.51 Liver activity: SUV max NA NECK: No hypermetabolic lymph nodes in the neck. Moderate symmetric hypermetabolism in the region of Waldeyer's ring likely inflammatory lymphoid tissue. Incidental CT findings: None. CHEST: 5.5 cm right upper lobe lung mass is hypermetabolic with SUV max of 14.42. Adjacent large necrotic right hilar/mediastinal mass has an SUV max of 1214. Innumerable hypermetabolic  pulmonary metastatic lesions. 17 mm right lower lobe lesion has an SUV max of 10.35. 13 mm right middle lobe lesion has an SUV max of 12.07. 14 mm left upper lobe nodule has an SUV max of 10.31. No hypermetabolic breast masses, supraclavicular or axillary adenopathy. Incidental CT findings: Stable age advanced atherosclerotic calcification involving the aorta and coronary arteries. ABDOMEN/PELVIS: Hepatic metastatic disease. 15 mm segment 7 lesion has an SUV max of 7.23. Partially necrotic 7 cm mass and segment 2 has an SUV max of 12.9 E. partially necrotic mass in segment 3 has an SUV max of 12.75. 14 mm celiac axis node has an SUV max 10.76 No adrenal gland metastasis.  Abdominal lymphadenopathy. Incidental CT findings: Age advanced atherosclerotic calcification involving the aorta and iliac arteries. No aneurysm. Moderate sigmoid colon diverticulosis. SKELETON: Hypermetabolic focus in the lower right scapula has an SUV max of 3.98. No obvious CT abnormality but this is certainly suspicious for an osseous metastasis. Small hypermetabolic focus in the L4 vertebral body has an SUV max of 4.06 and is also suspicious. I do not see any other definite lesions. Incidental CT findings: None. IMPRESSION: 1. 5.5 cm right upper lobe lung mass and large necrotic right hilar/mediastinal mass are hypermetabolic and consistent with known neoplasm. 2. Innumerable hypermetabolic pulmonary metastatic lesions. 3. Hepatic metastatic disease. 4. Hypermetabolic celiac axis lymph node. 5. Two small hypermetabolic bone lesions in the right scapula and L4 vertebral body are suspicious for osseous metastatic disease. Aortic Atherosclerosis (ICD10-I70.0). Electronically Signed   By: Rudie Meyer M.D.   On: 08/14/2023 14:49   CT Angio Chest PE W/Cm &/Or Wo Cm  Result Date: 08/14/2023 CLINICAL DATA:  Pulmonary embolism (PE) suspected. History of lung cancer. EXAM: CT ANGIOGRAPHY CHEST WITH CONTRAST TECHNIQUE: Multidetector CT imaging of  the chest was performed using the standard protocol during bolus administration of intravenous contrast. Multiplanar CT image reconstructions and MIPs were obtained to evaluate the vascular anatomy. RADIATION DOSE REDUCTION: This exam was performed according to the departmental dose-optimization program which includes automated exposure control, adjustment of the mA and/or kV according to patient size and/or use of iterative reconstruction technique. CONTRAST:  75mL OMNIPAQUE IOHEXOL  350 MG/ML SOLN COMPARISON:  CT chest dated August 08, 2023. FINDINGS: Cardiovascular: Satisfactory opacification of the pulmonary arteries to the segmental level. No evidence of pulmonary embolism. There is narrowing of the right main and interlobar pulmonary arteries and compression of the right upper and middle lobe segmental arterial branches secondary to the large necrotic right hilar nodal mass, described below. Normal heart size. Multivessel coronary artery calcifications. Atherosclerotic calcification of the thoracic aorta. No pericardial effusion. Mediastinum/Nodes: Redemonstration of a large necrotic nodal mass involving the right hilum and invading the mediastinum, similar to the prior exam. The esophagus is grossly unremarkable. Lungs/Pleura: Essentially stable large right upper lobe lung mass measuring approximately 5.6 x 4.7 cm, variations in measurement may be attributable to differences in slice selection. Innumerable bilateral pulmonary metastatic lesions are again noted. There are new patchy ground-glass and consolidative changes in the right mid lung (series 13, image 53). There is similar compression of the bronchus intermedius secondary to the large necrotic nodal mass of the right hilum. No pneumothorax. Upper Abdomen: Unchanged large left hepatic heterogenous hypodense lesion measuring up to 7 cm. Otherwise, no acute findings within the visualized upper abdomen. Musculoskeletal: No suspicious osseous lesion. Review  of the MIP images confirms the above findings. IMPRESSION: 1. No evidence of acute pulmonary embolism. 2. New patchy ground-glass and consolidative changes in the right mid lung may relate to a postobstructive infectious/inflammatory etiology secondary to the known large right hilar nodal mass. 3. Otherwise, no significant change compared to the prior examination dated August 08, 2023 with redemonstration of a dominant right upper lobe lung mass and large necrotic right hilar nodal mass invading the mediastinum. There is associated narrowing of the right main and interlobar pulmonary arteries and compression of the right upper and middle lobe segmental arterial branches. 4. Innumerable bilateral pulmonary metastatic lesions. 5. Unchanged large left hepatic lobe lesion, concerning for metastatic disease. 6.  Aortic Atherosclerosis (ICD10-I70.0). Electronically Signed   By: Hart Robinsons M.D.   On: 08/14/2023 14:48   DG Chest Port 1 View  Result Date: 08/14/2023 CLINICAL DATA:  History of lung cancer with shortness of breath EXAM: PORTABLE CHEST 1 VIEW COMPARISON:  X-ray 07/21/2019, CT 08/08/2023 FINDINGS: Underinflation with the elevated right hemidiaphragm. Multiple bilateral mass lesions are again seen, right-greater-than-left. Several are increased compared to the prior x-ray. Please correlate with more recent CT examination. No pneumothorax or effusion. Basilar atelectasis. Normal cardiopericardial silhouette. Calcified aorta. IMPRESSION: Multiple bilateral lung masses, increased compared to the prior x-ray. There also new elevation of the right hemidiaphragm. Please correlate with the more recent CT examinations. Electronically Signed   By: Karen Kays M.D.   On: 08/14/2023 10:41   CT Super D Chest Wo Contrast  Result Date: 08/08/2023 CLINICAL DATA:  Non-small cell lung cancer.  Pre EMB and biopsy. EXAM: CT CHEST WITHOUT CONTRAST TECHNIQUE: Multidetector CT imaging of the chest was performed using  thin slice collimation for electromagnetic bronchoscopy planning purposes, without intravenous contrast. RADIATION DOSE REDUCTION: This exam was performed according to the departmental dose-optimization program which includes automated exposure control, adjustment of the mA and/or kV according to patient size and/or use of iterative reconstruction technique. COMPARISON:  Chest CT 07/21/2023 FINDINGS: Cardiovascular: The heart is normal in size. No pericardial effusion. Stable age advanced aortic and coronary artery calcifications. Calcifications also noted around the aortic valve. Mediastinum/Nodes: Stable large necrotic mass of adenopathy involving the right hilum and invading the mediastinum. This is compressing the bronchus intermedius. The esophagus is grossly  normal. Lungs/Pleura: Stable large right upper lobe lung mass measuring approximately 5.8 x 5.4 cm. Enlarging innumerable pulmonary metastatic lesions bilaterally. Upper Abdomen: Large left hepatic lobe lesion measuring approximately 7 cm. No adrenal gland masses. No upper abdominal adenopathy. Stable age advanced aortic calcifications. Suspect cholelithiasis. Musculoskeletal: No findings for osseous metastatic disease. IMPRESSION: 1. Stable large right upper lobe lung mass with associated large necrotic mass of adenopathy involving the right hilum and invading the mediastinum. 2. Enlarging innumerable pulmonary metastatic lesions bilaterally. 3. Large left hepatic lobe lesion. Aortic Atherosclerosis (ICD10-I70.0). Electronically Signed   By: Rudie Meyer M.D.   On: 08/08/2023 10:32   DG C-Arm 1-60 Min-No Report  Result Date: 08/08/2023 Fluoroscopy was utilized by the requesting physician.  No radiographic interpretation.   CT Chest W Contrast  Result Date: 07/21/2023 CLINICAL DATA:  Chest soreness with breathing EXAM: CT CHEST WITH CONTRAST TECHNIQUE: Multidetector CT imaging of the chest was performed during intravenous contrast administration.  RADIATION DOSE REDUCTION: This exam was performed according to the departmental dose-optimization program which includes automated exposure control, adjustment of the mA and/or kV according to patient size and/or use of iterative reconstruction technique. CONTRAST:  75mL OMNIPAQUE IOHEXOL 300 MG/ML  SOLN COMPARISON:  Chest x-ray 07/21/2023 FINDINGS: Cardiovascular: Nonaneurysmal aorta. Mild atherosclerosis. Normal cardiac size. No pericardial effusion Mediastinum/Nodes: Midline trachea. No thyroid mass. Subcarinal lymph node measuring 1.6 cm on series 2, image 23. Right hilar lobulated mass measuring 5.7 by 4.7 cm with vascular encasement of right pulmonary vessels. Esophagus within normal limits Lungs/Pleura: No pleural effusion or pneumothorax. Numerous bilateral pulmonary nodules consistent with metastatic disease. Largest on the left is seen at the anterior left lung base and measures 14 mm on series 4, image 100. Dominant right upper lobe lobulated and partially cavitary mass abutting the pleural surface, this measures 5.3 by 4 point 5 by 4.7 cm on series 4, image 32. Mild subpleural bandlike density in the posterior right lower lobe could be secondary to post inflammatory or infectious scarring. Upper Abdomen: Heterogeneous left hepatic lobe masses partially visualized, more superior mass measures 6.7 x 4.3 cm. There is background steatosis. Indeterminate hypodense splenic masses, for example 16 mm lesion series 2, image 52 and 16 mm lesion series 2, image 57. right hepatic lobe vague lesion measuring 1.7 cm on series 2, image 50. Musculoskeletal: No acute osseous abnormality. IMPRESSION: 1. Numerous bilateral pulmonary nodules consistent with metastatic disease. 2. Dominant right apical partially cavitary mass abutting the pleural surface either representing primary lung neoplasm versus metastatic focus. Additional finding of lobulated right hilar mass with narrowing of pulmonary vessels, this could represent  nodal conglomerate versus primary lung mass. 3. Hypodense liver masses favored to represent metastatic disease. There is however underlying steatosis 4. Several hypodense splenic lesions which are indeterminate and raise concern for potential metastatic disease. Aortic Atherosclerosis (ICD10-I70.0). Electronically Signed   By: Jasmine Pang M.D.   On: 07/21/2023 22:04   DG Chest 2 View  Result Date: 07/21/2023 CLINICAL DATA:  pain with inspiration EXAM: CHEST - 2 VIEW COMPARISON:  CXR 12/24/15 FINDINGS: No pleural effusion. No pneumothorax. Normal cardiac and mediastinal contours. New nodular lesions/masses at the right hilum, in the right upper lobe, and in the right mid lung field. No radiographically apparent displaced rib fractures. Visualized upper abdomen is unremarkable. IMPRESSION: New nodular lesions/masses at the right hilum, in the right upper lobe, and in the right mid lung field. Recommend further evaluation with chest CT to confirm the possibility of metastatic  disease. Electronically Signed   By: Lorenza Cambridge M.D.   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  Clinical picture is consistent with postobstructive pneumonia from the large lung mass.  Would recommend palliative radiation therapy to help shrink the dominant lung masses in the right lung and to help with postobstructive issues and help with breathing issues.  Today, I talked to the patient and husband about the findings and work-up thus far.  We discussed the natural history of non-small cell lung cancer and general treatment, highlighting the role of radiotherapy in the management.  We discussed the available radiation techniques, and focused on the details of logistics and  delivery.  We reviewed the anticipated acute and late sequelae associated with radiation in this setting.  The patient was encouraged to ask questions that I answered to the best of my ability.  A patient consent form was discussed and signed.  We retained a copy for our records.  The patient would like to proceed with radiation and will be scheduled for CT simulation.  PLAN: Patient is scheduled for CT simulation later today. Anticipate 14 fractions of palliative radiation directed at the right upper lung region.   60 minutes of total time was spent for this patient encounter, including preparation, face-to-face counseling with the patient and coordination of care, physical exam, and documentation of the encounter.   ------------------------------------------------   Joyice Faster, PA-C   Billie Lade, PhD, MD  This document serves as a record of services personally performed by Antony Blackbird, MD. It was created on his behalf by Neena Rhymes, a trained medical scribe. The creation of this record is based on the scribe's personal observations and the provider's statements to them. This document has been checked and approved by the attending provider.

## 2023-08-15 NOTE — Plan of Care (Signed)
Problem: Clinical Measurements: Goal: Ability to maintain clinical measurements within normal limits will improve Outcome: Progressing Goal: Respiratory complications will improve Outcome: Progressing Goal: Cardiovascular complication will be avoided Outcome: Progressing

## 2023-08-16 ENCOUNTER — Other Ambulatory Visit: Payer: Self-pay

## 2023-08-16 ENCOUNTER — Ambulatory Visit
Admit: 2023-08-16 | Discharge: 2023-08-16 | Discharge status: Home / Self Care | Payer: No Typology Code available for payment source | Attending: Radiation Oncology | Admitting: Radiation Oncology

## 2023-08-16 DIAGNOSIS — J189 Pneumonia, unspecified organism: Secondary | ICD-10-CM | POA: Diagnosis not present

## 2023-08-16 DIAGNOSIS — C3491 Malignant neoplasm of unspecified part of right bronchus or lung: Secondary | ICD-10-CM

## 2023-08-16 LAB — CBC WITH DIFFERENTIAL/PLATELET
Abs Immature Granulocytes: 0.18 10*3/uL — ABNORMAL HIGH (ref 0.00–0.07)
Basophils Absolute: 0 10*3/uL (ref 0.0–0.1)
Basophils Relative: 0 %
Eosinophils Absolute: 0 10*3/uL (ref 0.0–0.5)
Eosinophils Relative: 0 %
HCT: 32 % — ABNORMAL LOW (ref 36.0–46.0)
Hemoglobin: 10.3 g/dL — ABNORMAL LOW (ref 12.0–15.0)
Immature Granulocytes: 1 %
Lymphocytes Relative: 5 %
Lymphs Abs: 1 10*3/uL (ref 0.7–4.0)
MCH: 30.6 pg (ref 26.0–34.0)
MCHC: 32.2 g/dL (ref 30.0–36.0)
MCV: 95 fL (ref 80.0–100.0)
Monocytes Absolute: 1.2 10*3/uL — ABNORMAL HIGH (ref 0.1–1.0)
Monocytes Relative: 6 %
Neutro Abs: 16.9 10*3/uL — ABNORMAL HIGH (ref 1.7–7.7)
Neutrophils Relative %: 88 %
Platelets: 312 10*3/uL (ref 150–400)
RBC: 3.37 MIL/uL — ABNORMAL LOW (ref 3.87–5.11)
RDW: 14.1 % (ref 11.5–15.5)
WBC: 19.4 10*3/uL — ABNORMAL HIGH (ref 4.0–10.5)
nRBC: 0 % (ref 0.0–0.2)

## 2023-08-16 LAB — RAD ONC ARIA SESSION SUMMARY
Course Elapsed Days: 0
Plan Fractions Treated to Date: 1
Plan Prescribed Dose Per Fraction: 2.5 Gy
Plan Total Fractions Prescribed: 14
Plan Total Prescribed Dose: 35 Gy
Reference Point Dosage Given to Date: 2.5 Gy
Reference Point Session Dosage Given: 2.5 Gy
Session Number: 1

## 2023-08-16 LAB — BASIC METABOLIC PANEL
Anion gap: 11 (ref 5–15)
BUN: 16 mg/dL (ref 8–23)
CO2: 29 mmol/L (ref 22–32)
Calcium: 9 mg/dL (ref 8.9–10.3)
Chloride: 93 mmol/L — ABNORMAL LOW (ref 98–111)
Creatinine, Ser: 0.85 mg/dL (ref 0.44–1.00)
GFR, Estimated: >60 mL/min (ref >60)
Glucose, Bld: 118 mg/dL — ABNORMAL HIGH (ref 70–99)
Potassium: 3.4 mmol/L — ABNORMAL LOW (ref 3.5–5.1)
Sodium: 133 mmol/L — ABNORMAL LOW (ref 135–145)

## 2023-08-16 LAB — HEPATIC FUNCTION PANEL
ALT: 25 U/L (ref 0–44)
AST: 35 U/L (ref 15–41)
Albumin: 3.3 g/dL — ABNORMAL LOW (ref 3.5–5.0)
Alkaline Phosphatase: 91 U/L (ref 38–126)
Bilirubin, Direct: 0.3 mg/dL — ABNORMAL HIGH (ref 0.0–0.2)
Indirect Bilirubin: 0.7 mg/dL (ref 0.3–0.9)
Total Bilirubin: 1 mg/dL (ref 0.3–1.2)
Total Protein: 7 g/dL (ref 6.5–8.1)

## 2023-08-16 LAB — VITAMIN B12: Vitamin B-12: 316 pg/mL (ref 180–914)

## 2023-08-16 LAB — TECHNOLOGIST SMEAR REVIEW

## 2023-08-16 LAB — RETICULOCYTES
Immature Retic Fract: 21.7 % — ABNORMAL HIGH (ref 2.3–15.9)
RBC.: 3.35 MIL/uL — ABNORMAL LOW (ref 3.87–5.11)
Retic Count, Absolute: 100.5 10*3/uL (ref 19.0–186.0)
Retic Ct Pct: 3 % (ref 0.4–3.1)

## 2023-08-16 LAB — PROTIME-INR
INR: 1.1 (ref 0.8–1.2)
Prothrombin Time: 14.4 s (ref 11.4–15.2)

## 2023-08-16 LAB — MAGNESIUM: Magnesium: 2.4 mg/dL (ref 1.7–2.4)

## 2023-08-16 MED ORDER — VITAMIN B-12 1000 MCG PO TABS
1000.0000 ug | ORAL_TABLET | Freq: Every day | ORAL | Status: DC
  Administered 2023-08-16 – 2023-08-17 (×2): 1000 ug via ORAL
  Filled 2023-08-16 (×2): qty 1

## 2023-08-16 MED ORDER — HYDROCODONE BIT-HOMATROP MBR 5-1.5 MG/5ML PO SOLN
5.0000 mL | ORAL | Status: DC | PRN
  Administered 2023-08-16 – 2023-08-17 (×2): 5 mL via ORAL
  Filled 2023-08-16 (×2): qty 5

## 2023-08-16 MED ORDER — DM-GUAIFENESIN ER 30-600 MG PO TB12
1.0000 | ORAL_TABLET | Freq: Two times a day (BID) | ORAL | Status: DC
  Administered 2023-08-16 – 2023-08-17 (×2): 1 via ORAL
  Filled 2023-08-16 (×2): qty 1

## 2023-08-16 MED ORDER — HYDROCODONE BIT-HOMATROP MBR 5-1.5 MG/5ML PO SOLN: 5.0000 mL | ORAL | Status: DC | PRN

## 2023-08-16 MED ORDER — BENZONATATE 100 MG PO CAPS
100.0000 mg | ORAL_CAPSULE | Freq: Three times a day (TID) | ORAL | Status: DC
  Administered 2023-08-16 – 2023-08-17 (×3): 100 mg via ORAL
  Filled 2023-08-16 (×3): qty 1

## 2023-08-16 NOTE — Plan of Care (Signed)

## 2023-08-16 NOTE — Progress Notes (Signed)
Transition of Care [facility]) - Inpatient Brief Assessment   Patient Details  Name: Caylea Valladolid MRN: [id number] Date of Birth: 1961-10-09  Transition of Care [facility]) CM/SW Contact:    Larrie Kass, LCSW Phone Number: 08/16/2023, 4:07 PM   Clinical Narrative:  Transition of Care Department [facility]) has reviewed patient and no TOC needs have been identified at this time. We will continue to monitor patient advancement through interdisciplinary progression rounds. If new patient transition needs arise, please place a TOC consult.  Transition of Care Asessment: Insurance and Status: Insurance coverage has been reviewed Patient has primary care physician: Yes Home environment has been reviewed: from home Prior level of function:: independent Prior/Current Home Services: No current home services Social Determinants of Health Reivew: SDOH reviewed no interventions necessary Readmission risk has been reviewed: Yes Transition of care needs: no transition of care needs at this time

## 2023-08-16 NOTE — Progress Notes (Signed)
[facility]                                                                              Rebecca Ochoa, is a 62 y.o. female, DOB - [birth year], IRJ:[id number] Admit date - 08/14/2023    Outpatient Primary MD for the patient is [name], MD  LOS - 2  days  Chief Complaint  Patient presents with   Shortness of Breath   Dizziness       Brief summary   Patient is a 62 year old female with HTN, HLP, smoking, recent diagnosis of non-small cell lung CA on 08/08/23 not yet on chemotherapy presented with worsening cough, chest pain, shortness of breath found to have COVID infection and possible postobstructive pneumonia. She had bronchoscopy with endobronchial ultrasound and biopsies with Dr. Delton Coombes on 10/8. Since that time, she has had some dyspnea with exertion, increased cough.  Denied any hemoptysis, fevers or chills.  Dr. Delton Coombes had called in p.o. doxycycline 10/11 for possible bronchitis. However, she has failed to improve.  Reported worsening right-sided pleuritic chest pain.   Assessment & Plan  Sepsis (HCC), POA secondary to postobstructive pneumonia -Met criteria for sepsis due to tachycardia, tachypnea, leukocytosis, suspected postobstructive pneumonia -Started on IV Zosyn, blood cultures negative for 48 hours. -CTA chest showed no PE, new patchy groundglass and consolidative changes in the right midlung likely related to postobstructive infectious/inflammatory etiology Does not require steroids for infection purposes. Will reduce prednisone to 50 mg daily.  COVID-19 virus infection, likely incidental diagnosed on 10/14. -Currently no hypoxia, not started on COVID-specific therapy -Monitor for any fevers, hypoxia.  Continue airborne precautions out of isolation on 10/25 per hospital protocol.  Non-small cell lung cancer [facility]), metastatic Recent diagnosis. Bronchoscopy 10/8 with Dr. Delton Coombes. PET scan 10/11. - CTA chest showed postobstructive pneumonia, right  upper lobe lung mass and large necrotic right hilar nodal mass invading the mediastinum, bilateral pulmonary metastatic lesions, large left hepatic lobe lesion concerning for metastatic disease -Oncology and radiation oncology consulted, Started on radiation. Oncology recommended to continue steroid for anti-inflammatory effect.  Will reduce from Solu-Medrol 40 every 12 to prednisone 50 daily.    Essential hypertension -BP stable hold losartan/hydrochlorothiazide  Anxiety -Continue Xanax as needed  Cough. Medication adjusted.  Monitor.  Anorexia. In the setting of malignancy.  Monitor.  Hypokalemia. Replaced. Monitor.  Anemia. H&H stable. Will monitor. B12 relatively low.  Will supplement.  Hyponatremia. Clinically nonsignificant.  Leukocytosis. Secondary to steroid or other infection. No further workup.  Code Status: Full code DVT Prophylaxis:  enoxaparin (LOVENOX) injection 40 mg Start: 08/14/23 2200 SCDs Start: 08/14/23 1617   Level of Care: Level of care: Progressive Family Communication: Updated patient's family at the bedside Disposition Plan:      Remains inpatient appropriate:   Currently receiving IV antibiotic.  Monitor for improvement.  Switch to oral tomorrow.  Defer to radiation oncology for dispo.  Procedures:    Consultants:   Oncology Radiation oncology  Antimicrobials:   Anti-infectives (From admission, onward)    Start     Dose/Rate Route Frequency Ordered Stop   08/14/23 2200  piperacillin-tazobactam (ZOSYN) IVPB 3.375 g  3.375 g 12.5 mL/hr over 240 Minutes Intravenous Every 8 hours 08/14/23 1647     08/14/23 1600  piperacillin-tazobactam (ZOSYN) IVPB 3.375 g        3.375 g 100 mL/hr over 30 Minutes Intravenous  Once 08/14/23 1548 08/14/23 1725          Medications  enoxaparin (LOVENOX) injection  40 mg Subcutaneous Q24H      Subjective:  Continue to have cough.  Continues to have insomnia.  No nausea no  vomiting.  Objective:   Vitals:   08/15/23 1135 08/15/23 1931 08/16/23 0514 08/16/23 1421  BP: 128/65 135/69 114/66 119/63  Pulse: (!) 106 98 94 98  Resp: 18 18 18 18   Temp: 98.6 F (37 C) 98.7 F (37.1 C) 97.8 F (36.6 C) 98.1 F (36.7 C)  TempSrc: Oral Oral Oral Oral  SpO2: 94% 94% 93% 91%  Weight:      Height:        Intake/Output Summary (Last 24 hours) at 08/16/2023 1844 Last data filed at 08/16/2023 0846 Gross per 24 hour  Intake 639.85 ml  Output --  Net 639.85 ml     Wt Readings from Last 3 Encounters:  08/14/23 78.4 kg  08/08/23 81.2 kg  08/02/23 81.8 kg     Exam Alert, awake. Clear to auscultation. No significant wheeze heard. Faint crackles at the bases. No edema.  Data Reviewed:  I have personally reviewed following labs    CBC Lab Results  Component Value Date   WBC 19.4 (H) 08/16/2023   RBC 3.37 (L) 08/16/2023   RBC 3.35 (L) 08/16/2023   HGB 10.3 (L) 08/16/2023   HCT 32.0 (L) 08/16/2023   MCV 95.0 08/16/2023   MCH 30.6 08/16/2023   PLT 312 08/16/2023   MCHC 32.2 08/16/2023   RDW 14.1 08/16/2023   LYMPHSABS 1.0 08/16/2023   MONOABS 1.2 (H) 08/16/2023   EOSABS 0.0 08/16/2023   BASOSABS 0.0 08/16/2023     Last metabolic panel Lab Results  Component Value Date   NA 133 (L) 08/16/2023   K 3.4 (L) 08/16/2023   CL 93 (L) 08/16/2023   CO2 29 08/16/2023   BUN 16 08/16/2023   CREATININE 0.85 08/16/2023   GLUCOSE 118 (H) 08/16/2023   GFRNONAA >60 08/16/2023   GFRAA 95 10/12/2020   CALCIUM 9.0 08/16/2023   PROT 7.0 08/16/2023   ALBUMIN 3.3 (L) 08/16/2023   BILITOT 1.0 08/16/2023   ALKPHOS 91 08/16/2023   AST 35 08/16/2023   ALT 25 08/16/2023   ANIONGAP 11 08/16/2023    CBG (last 3)  No results for input(s): "GLUCAP" in the last 72 hours.    Coagulation Profile: Recent Labs  Lab 08/14/23 0953 08/16/23 1036  INR 1.1 1.1     Radiology Studies: I have personally reviewed the imaging studies  No results  found.     Lynden Oxford M.D. [facility] 08/16/2023, 6:44 PM  Available via Epic secure chat 7am-7pm After 7 pm, please refer to night coverage provider listed on amion.

## 2023-08-17 ENCOUNTER — Other Ambulatory Visit: Payer: Self-pay

## 2023-08-17 ENCOUNTER — Ambulatory Visit
Admit: 2023-08-17 | Discharge: 2023-08-17 | Disposition: A | Discharge status: Home / Self Care | Payer: No Typology Code available for payment source | Attending: Radiation Oncology | Admitting: Radiation Oncology

## 2023-08-17 DIAGNOSIS — A419 Sepsis, unspecified organism: Secondary | ICD-10-CM

## 2023-08-17 LAB — RAD ONC ARIA SESSION SUMMARY
Course Elapsed Days: 1
Plan Fractions Treated to Date: 2
Plan Prescribed Dose Per Fraction: 2.5 Gy
Plan Total Fractions Prescribed: 14
Plan Total Prescribed Dose: 35 Gy
Reference Point Dosage Given to Date: 5 Gy
Reference Point Session Dosage Given: 2.5 Gy
Session Number: 2

## 2023-08-17 MED ORDER — AMOXICILLIN-POT CLAVULANATE 875-125 MG PO TABS
1.0000 | ORAL_TABLET | Freq: Two times a day (BID) | ORAL | Status: DC
  Administered 2023-08-17: 1 via ORAL
  Filled 2023-08-17: qty 1

## 2023-08-17 MED ORDER — ONDANSETRON HCL 4 MG PO TABS: 4.0000 mg | ORAL_TABLET | Freq: Four times a day (QID) | ORAL | 0 refills | Status: DC | PRN

## 2023-08-17 MED ORDER — ORAL CARE MOUTH RINSE: 15.0000 mL | OROMUCOSAL | Status: DC | PRN

## 2023-08-17 MED ORDER — PREDNISONE 50 MG PO TABS
50.0000 mg | ORAL_TABLET | Freq: Every day | ORAL | Status: DC
  Administered 2023-08-17: 50 mg via ORAL
  Filled 2023-08-17: qty 1

## 2023-08-17 MED ORDER — BENZONATATE 100 MG PO CAPS: 100.0000 mg | ORAL_CAPSULE | Freq: Three times a day (TID) | ORAL | 0 refills | Status: DC

## 2023-08-17 MED ORDER — CYANOCOBALAMIN 1000 MCG PO TABS: 1000.0000 ug | ORAL_TABLET | Freq: Every day | ORAL | 0 refills | Status: DC

## 2023-08-17 MED ORDER — PREDNISONE 50 MG PO TABS: 50.0000 mg | ORAL_TABLET | Freq: Every day | ORAL | 0 refills | Status: DC

## 2023-08-17 MED ORDER — DM-GUAIFENESIN ER 30-600 MG PO TB12: 1.0000 | ORAL_TABLET | Freq: Two times a day (BID) | ORAL | 0 refills | Status: DC

## 2023-08-17 MED ORDER — AMOXICILLIN-POT CLAVULANATE 875-125 MG PO TABS: 1.0000 | ORAL_TABLET | Freq: Two times a day (BID) | ORAL | 0 refills | Status: AC

## 2023-08-17 MED ORDER — HYDROCODONE BIT-HOMATROP MBR 5-1.5 MG/5ML PO SOLN: 5.0000 mL | ORAL | 0 refills | Status: DC | PRN

## 2023-08-17 NOTE — Progress Notes (Signed)
Request for tissue be sent to [facility] for molecular studies and PD-L1 emailed to Lita Mains, pathology technician

## 2023-08-17 NOTE — Plan of Care (Signed)
  Problem: Education: Goal: Knowledge of General Education information will improve Description: Including pain rating scale, medication(s)/side effects and non-pharmacologic comfort measures 08/17/2023 1629 by Brandon Melnick, RN Outcome: Completed/Met 08/17/2023 1536 by Brandon Melnick, RN Outcome: Progressing   Problem: Health Behavior/Discharge Planning: Goal: Ability to manage health-related needs will improve Outcome: Completed/Met   Problem: Clinical Measurements: Goal: Ability to maintain clinical measurements within normal limits will improve Outcome: Completed/Met Goal: Will remain free from infection Outcome: Completed/Met Goal: Diagnostic test results will improve Outcome: Completed/Met Goal: Respiratory complications will improve Outcome: Completed/Met Goal: Cardiovascular complication will be avoided Outcome: Completed/Met   Problem: Activity: Goal: Risk for activity intolerance will decrease 08/17/2023 1629 by Brandon Melnick, RN Outcome: Completed/Met 08/17/2023 1536 by Brandon Melnick, RN Outcome: Progressing   Problem: Nutrition: Goal: Adequate nutrition will be maintained 08/17/2023 1629 by Brandon Melnick, RN Outcome: Completed/Met 08/17/2023 1536 by Brandon Melnick, RN Outcome: Progressing   Problem: Coping: Goal: Level of anxiety will decrease 08/17/2023 1629 by Brandon Melnick, RN Outcome: Completed/Met 08/17/2023 1536 by Brandon Melnick, RN Outcome: Progressing   Problem: Elimination: Goal: Will not experience complications related to bowel motility Outcome: Completed/Met Goal: Will not experience complications related to urinary retention 08/17/2023 1629 by Brandon Melnick, RN Outcome: Completed/Met 08/17/2023 1536 by Brandon Melnick, RN Outcome: Progressing   Problem: Pain Managment: Goal: General experience of comfort will improve 08/17/2023 1629 by Brandon Melnick, RN Outcome:  Completed/Met 08/17/2023 1536 by Brandon Melnick, RN Outcome: Progressing   Problem: Safety: Goal: Ability to remain free from injury will improve 08/17/2023 1629 by Brandon Melnick, RN Outcome: Completed/Met 08/17/2023 1536 by Brandon Melnick, RN Outcome: Progressing   Problem: Skin Integrity: Goal: Risk for impaired skin integrity will decrease 08/17/2023 1629 by Brandon Melnick, RN Outcome: Completed/Met 08/17/2023 1536 by Brandon Melnick, RN Outcome: Progressing   Problem: Education: Goal: Understanding of post-operative needs will improve Outcome: Completed/Met Goal: Individualized Educational Video(s) Outcome: Completed/Met

## 2023-08-17 NOTE — Plan of Care (Signed)

## 2023-08-17 NOTE — Progress Notes (Signed)
Patient received discharge orders to go home. Patient was given discharge paperwork/instructions and work note. RN went over discharge paperwork/instructions with the patient and patient's significant other. All questions/concerns were answered/addressed during that time. Patient left the hospital stable, had discharge paperwork/instructions and work note, and had all personal belongings.

## 2023-08-17 NOTE — Progress Notes (Signed)
SATURATION QUALIFICATIONS: (This note is used to comply with regulatory documentation for home oxygen)  Patient Saturations on Room Air at Rest = 93%  Patient Saturations on Room Air while Ambulating = 91-93%  Patient Saturations on 0 Liters of oxygen while Ambulating = 91-93%  Patient ambulated in the hall with mask per protocol. Patient did have dyspnea with exertion. Patient did not require any oxygen.

## 2023-08-18 ENCOUNTER — Other Ambulatory Visit: Payer: Self-pay

## 2023-08-18 ENCOUNTER — Inpatient Hospital Stay
Admission: RE | Admit: 2023-08-18 | Discharge: 2023-08-18 | Disposition: A | Discharge status: Home / Self Care | Payer: Self-pay | Source: Ambulatory Visit | Attending: Radiation Oncology | Admitting: Radiation Oncology

## 2023-08-18 DIAGNOSIS — Z510 Encounter for antineoplastic radiation therapy: Secondary | ICD-10-CM | POA: Diagnosis present

## 2023-08-18 DIAGNOSIS — C787 Secondary malignant neoplasm of liver and intrahepatic bile duct: Secondary | ICD-10-CM | POA: Diagnosis not present

## 2023-08-18 DIAGNOSIS — C7951 Secondary malignant neoplasm of bone: Secondary | ICD-10-CM | POA: Diagnosis not present

## 2023-08-18 DIAGNOSIS — C3411 Malignant neoplasm of upper lobe, right bronchus or lung: Secondary | ICD-10-CM | POA: Diagnosis present

## 2023-08-18 LAB — RAD ONC ARIA SESSION SUMMARY
Course Elapsed Days: 2
Plan Fractions Treated to Date: 3
Plan Prescribed Dose Per Fraction: 2.5 Gy
Plan Total Fractions Prescribed: 14
Plan Total Prescribed Dose: 35 Gy
Reference Point Dosage Given to Date: 7.5 Gy
Reference Point Session Dosage Given: 2.5 Gy
Session Number: 3

## 2023-08-19 LAB — CULTURE, BLOOD (ROUTINE X 2)
Culture: NO GROWTH
Culture: NO GROWTH
Special Requests: ADEQUATE
Special Requests: ADEQUATE

## 2023-08-21 ENCOUNTER — Ambulatory Visit
Admission: RE | Admit: 2023-08-21 | Discharge: 2023-08-21 | Disposition: A | Discharge status: Home / Self Care | Payer: No Typology Code available for payment source | Source: Ambulatory Visit | Attending: Radiation Oncology | Admitting: Radiation Oncology

## 2023-08-21 ENCOUNTER — Other Ambulatory Visit: Payer: Self-pay

## 2023-08-21 DIAGNOSIS — Z510 Encounter for antineoplastic radiation therapy: Secondary | ICD-10-CM | POA: Diagnosis not present

## 2023-08-21 LAB — RAD ONC ARIA SESSION SUMMARY
Course Elapsed Days: 5
Plan Fractions Treated to Date: 4
Plan Prescribed Dose Per Fraction: 2.5 Gy
Plan Total Fractions Prescribed: 14
Plan Total Prescribed Dose: 35 Gy
Reference Point Dosage Given to Date: 10 Gy
Reference Point Session Dosage Given: 2.5 Gy
Session Number: 4

## 2023-08-21 NOTE — Discharge Summary (Signed)
Physician Discharge Summary   Patient: Rebecca Ochoa MRN: [id number] DOB: 1961-06-02  Admit date:     08/14/2023  Discharge date: 08/17/2023  Discharge Physician: Lynden Oxford  PCP: [name], MD  Recommendations at discharge: Follow-up with PCP in 1 week.   Follow-up Information     [name], MD. Schedule an appointment as soon as possible for a visit in 1 week(s).   Specialty: Internal Medicine Contact information: [address] [place] [address] [phone]         Si Gaul, MD. Schedule an appointment as soon as possible for a visit in 1 week(s).   Specialty: Oncology Contact information: [address] [place] [address] [phone]         Antony Blackbird, MD. Schedule an appointment as soon as possible for a visit in 1 week(s).   Specialty: Radiation Oncology Contact information: [address] [place] [address] [phone]                Discharge Diagnoses: Principal Problem:   Sepsis [facility]) Active Problems:   Hyperlipidemia   Postobstructive pneumonia   Non-small cell lung cancer (HCC)   COVID-19 virus infection   Essential hypertension  Hospital Course: Brief summary    Patient is a 62 year old female with HTN, HLP, smoking, recent diagnosis of non-small cell lung CA on 08/08/23 not yet on chemotherapy presented with worsening cough, chest pain, shortness of breath found to have COVID infection and possible postobstructive pneumonia. She had bronchoscopy with endobronchial ultrasound and biopsies with Dr. Delton Coombes on 10/8. Since that time, she has had some dyspnea with exertion, increased cough.  Denied any hemoptysis, fevers or chills.  Dr. Delton Coombes had called in p.o. doxycycline 10/11 for possible bronchitis. However, she has failed to improve.  Reported worsening right-sided pleuritic chest pain.     Assessment & Plan  Sepsis (HCC), POA secondary to postobstructive  pneumonia -Met criteria for sepsis due to tachycardia, tachypnea, leukocytosis, suspected postobstructive pneumonia -Started on IV Zosyn, blood cultures negative for 48 hours. -CTA chest showed no PE, new patchy groundglass and consolidative changes in the right midlung likely related to postobstructive infectious/inflammatory etiology Does not require steroids for infection purposes. Will reduce prednisone to 50 mg daily.   COVID-19 virus infection, likely incidental diagnosed on 10/14. -Currently no hypoxia, not started on COVID-specific therapy -Monitor for any fevers, hypoxia.  Continue airborne precautions out of isolation on 10/25 per hospital protocol.   Non-small cell lung cancer [facility]), metastatic Recent diagnosis. Bronchoscopy 10/8 with Dr. Delton Coombes. PET scan 10/11. - CTA chest showed postobstructive pneumonia, right upper lobe lung mass and large necrotic right hilar nodal mass invading the mediastinum, bilateral pulmonary metastatic lesions, large left hepatic lobe lesion concerning for metastatic disease -Oncology and radiation oncology consulted, Started on radiation. Oncology recommended to continue steroid for anti-inflammatory effect.  Will reduce from Solu-Medrol 40 every 12 to prednisone 50 daily.  Patient will follow-up with outpatient oncology and radiation oncology for taper.   Essential hypertension -BP stable hold losartan/hydrochlorothiazide   Anxiety -Continue Xanax as needed   Cough. Medication adjusted.  Monitor.   Anorexia. In the setting of malignancy.  Monitor.   Hypokalemia. Replaced. Monitor.   Anemia. H&H stable. Will monitor. B12 relatively low.  Will supplement.   Hyponatremia. Clinically nonsignificant.   Leukocytosis. Secondary to steroid or other infection. No further workup.  Pain control - [employer] Controlled Substance Reporting System database was reviewed. and patient was instructed, not to  drive, operate heavy machinery,  perform activities at heights, swimming or participation in water activities or provide baby-sitting services while on Pain, Sleep and Anxiety Medications; until their outpatient Physician has advised to do so again. Also recommended to not to take more than prescribed Pain, Sleep and Anxiety Medications.  Consultants:  Oncology Radiation oncology  Procedures performed:  Radiation  DISCHARGE MEDICATION: Allergies as of 08/17/2023       Reactions   Biaxin [clarithromycin] Nausea Only        Medication List     STOP taking these medications    doxycycline 100 MG tablet Commonly known as: VIBRA-TABS   losartan-hydrochlorothiazide 50-12.5 MG tablet Commonly known as: HYZAAR       TAKE these medications    acetaminophen 500 MG tablet Commonly known as: TYLENOL Take 1,000 mg by mouth every 8 (eight) hours as needed for mild pain.   albuterol 108 (90 Base) MCG/ACT inhaler Commonly known as: VENTOLIN HFA Inhale 2 puffs into the lungs every 6 (six) hours as needed for wheezing or shortness of breath.   ALPRAZolam 0.5 MG tablet Commonly known as: XANAX Take 1 tablet (0.5 mg total) by mouth 2 (two) times daily as needed for anxiety. What changed:  when to take this additional instructions   amoxicillin-clavulanate 875-125 MG tablet Commonly known as: AUGMENTIN Take 1 tablet by mouth 2 (two) times daily for 7 days.   benzonatate 100 MG capsule Commonly known as: TESSALON Take 1 capsule (100 mg total) by mouth 3 (three) times daily.   cyanocobalamin 1000 MCG tablet Take 1 tablet (1,000 mcg total) by mouth daily.   dextromethorphan-guaiFENesin 30-600 MG 12hr tablet Commonly known as: MUCINEX DM Take 1 tablet by mouth 2 (two) times daily.   HYDROcodone bit-homatropine 5-1.5 MG/5ML syrup Commonly known as: HYCODAN Take 5 mLs by mouth every 4 (four) hours as needed for cough.   HYDROcodone-acetaminophen 5-325 MG tablet Commonly known as: NORCO/VICODIN Take 1 tablet  by mouth every 6 (six) hours as needed.   lidocaine 5 % Commonly known as: Lidoderm Place 1 patch onto the skin every 12 (twelve) hours. Remove & Discard patch within 12 hours or as directed by MD   meloxicam 15 MG tablet Commonly known as: MOBIC Take 1/2 to 1 tablet  Daily  with Food  for Pain & Inflammation What changed:  how much to take how to take this when to take this additional instructions   ondansetron 4 MG tablet Commonly known as: ZOFRAN Take 1 tablet (4 mg total) by mouth every 6 (six) hours as needed for nausea.   predniSONE 50 MG tablet Commonly known as: DELTASONE Take 1 tablet (50 mg total) by mouth daily with breakfast.       Disposition: Home Diet recommendation: Regular diet  Discharge Exam: Vitals:   08/16/23 1421 08/16/23 2029 08/17/23 0413 08/17/23 0559  BP: 119/63 127/68 126/68 117/62  Pulse: 98 91 88 87  Resp: 18 17 18 16   Temp: 98.1 F (36.7 C) 98.3 F (36.8 C) 98.3 F (36.8 C) 98.3 F (36.8 C)  TempSrc: Oral Oral Oral Oral  SpO2: 91% 91% 93% 95%  Weight:      Height:       General: Appear in mild distress; no visible Abnormal Neck Mass Or lumps, Conjunctiva normal Cardiovascular: S1 and S2 Present, no Murmur, Respiratory: good respiratory effort, Bilateral Air entry present and no Crackles, Occasional  wheezes Abdomen: Bowel Sound present, Non tender  Extremities: no Pedal edema Neurology:  alert and oriented to time, place, and person  Filed Weights   08/14/23 1020 08/14/23 2239  Weight: 81.2 kg 78.4 kg   Condition at discharge: stable  The results of significant diagnostics from this hospitalization (including imaging, microbiology, ancillary and laboratory) are listed below for reference.   Imaging Studies: MR BRAIN W WO CONTRAST  Result Date: 08/16/2023 CLINICAL DATA:  Non-small cell lung cancer (NSCLC), staging EXAM: MRI HEAD WITHOUT AND WITH CONTRAST TECHNIQUE: Multiplanar, multiecho pulse sequences of the brain and  surrounding structures were obtained without and with intravenous contrast. CONTRAST:  8mL GADAVIST GADOBUTROL 1 MMOL/ML IV SOLN COMPARISON:  None Available. FINDINGS: Brain: No acute infarction, hemorrhage, hydrocephalus, extra-axial collection or mass lesion. No pathologic enhancement. Vascular: No pathologic enhancement. No Skull and upper cervical spine: Normal marrow signal. Sinuses/Orbits: Clear sinuses.  No acute orbital findings. Other: No mastoid effusions. IMPRESSION: No evidence of acute intracranial abnormality or metastatic disease. Electronically Signed   By: Feliberto Harts M.D.   On: 08/16/2023 09:26   NM PET Image Initial (PI) Skull Base To Thigh (F-18 FDG)  Result Date: 08/14/2023 CLINICAL DATA:  Initial treatment strategy for non-small cell lung cancer. EXAM: NUCLEAR MEDICINE PET SKULL BASE TO THIGH TECHNIQUE: 8.90 mCi F-18 FDG was injected intravenously. Full-ring PET imaging was performed from the skull base to thigh after the radiotracer. CT data was obtained and used for attenuation correction and anatomic localization. Fasting blood glucose: 118 mg/dl COMPARISON:  Chest CT 16/08/9603 FINDINGS: Mediastinal blood pool activity: SUV max 2.51 Liver activity: SUV max NA NECK: No hypermetabolic lymph nodes in the neck. Moderate symmetric hypermetabolism in the region of Waldeyer's ring likely inflammatory lymphoid tissue. Incidental CT findings: None. CHEST: 5.5 cm right upper lobe lung mass is hypermetabolic with SUV max of 14.42. Adjacent large necrotic right hilar/mediastinal mass has an SUV max of 1214. Innumerable hypermetabolic pulmonary metastatic lesions. 17 mm right lower lobe lesion has an SUV max of 10.35. 13 mm right middle lobe lesion has an SUV max of 12.07. 14 mm left upper lobe nodule has an SUV max of 10.31. No hypermetabolic breast masses, supraclavicular or axillary adenopathy. Incidental CT findings: Stable age advanced atherosclerotic calcification involving the aorta  and coronary arteries. ABDOMEN/PELVIS: Hepatic metastatic disease. 15 mm segment 7 lesion has an SUV max of 7.23. Partially necrotic 7 cm mass and segment 2 has an SUV max of 12.9 E. partially necrotic mass in segment 3 has an SUV max of 12.75. 14 mm celiac axis node has an SUV max 10.76 No adrenal gland metastasis.  Abdominal lymphadenopathy. Incidental CT findings: Age advanced atherosclerotic calcification involving the aorta and iliac arteries. No aneurysm. Moderate sigmoid colon diverticulosis. SKELETON: Hypermetabolic focus in the lower right scapula has an SUV max of 3.98. No obvious CT abnormality but this is certainly suspicious for an osseous metastasis. Small hypermetabolic focus in the L4 vertebral body has an SUV max of 4.06 and is also suspicious. I do not see any other definite lesions. Incidental CT findings: None. IMPRESSION: 1. 5.5 cm right upper lobe lung mass and large necrotic right hilar/mediastinal mass are hypermetabolic and consistent with known neoplasm. 2. Innumerable hypermetabolic pulmonary metastatic lesions. 3. Hepatic metastatic disease. 4. Hypermetabolic celiac axis lymph node. 5. Two small hypermetabolic bone lesions in the right scapula and L4 vertebral body are suspicious for osseous metastatic disease. Aortic Atherosclerosis (ICD10-I70.0). Electronically Signed   By: Rudie Meyer M.D.   On: 08/14/2023 14:49   CT Angio Chest PE W/Cm &/  Or Wo Cm  Result Date: 08/14/2023 CLINICAL DATA:  Pulmonary embolism (PE) suspected. History of lung cancer. EXAM: CT ANGIOGRAPHY CHEST WITH CONTRAST TECHNIQUE: Multidetector CT imaging of the chest was performed using the standard protocol during bolus administration of intravenous contrast. Multiplanar CT image reconstructions and MIPs were obtained to evaluate the vascular anatomy. RADIATION DOSE REDUCTION: This exam was performed according to the departmental dose-optimization program which includes automated exposure control, adjustment of  the mA and/or kV according to patient size and/or use of iterative reconstruction technique. CONTRAST:  75mL OMNIPAQUE IOHEXOL 350 MG/ML SOLN COMPARISON:  CT chest dated August 08, 2023. FINDINGS: Cardiovascular: Satisfactory opacification of the pulmonary arteries to the segmental level. No evidence of pulmonary embolism. There is narrowing of the right main and interlobar pulmonary arteries and compression of the right upper and middle lobe segmental arterial branches secondary to the large necrotic right hilar nodal mass, described below. Normal heart size. Multivessel coronary artery calcifications. Atherosclerotic calcification of the thoracic aorta. No pericardial effusion. Mediastinum/Nodes: Redemonstration of a large necrotic nodal mass involving the right hilum and invading the mediastinum, similar to the prior exam. The esophagus is grossly unremarkable. Lungs/Pleura: Essentially stable large right upper lobe lung mass measuring approximately 5.6 x 4.7 cm, variations in measurement may be attributable to differences in slice selection. Innumerable bilateral pulmonary metastatic lesions are again noted. There are new patchy ground-glass and consolidative changes in the right mid lung (series 13, image 53). There is similar compression of the bronchus intermedius secondary to the large necrotic nodal mass of the right hilum. No pneumothorax. Upper Abdomen: Unchanged large left hepatic heterogenous hypodense lesion measuring up to 7 cm. Otherwise, no acute findings within the visualized upper abdomen. Musculoskeletal: No suspicious osseous lesion. Review of the MIP images confirms the above findings. IMPRESSION: 1. No evidence of acute pulmonary embolism. 2. New patchy ground-glass and consolidative changes in the right mid lung may relate to a postobstructive infectious/inflammatory etiology secondary to the known large right hilar nodal mass. 3. Otherwise, no significant change compared to the prior  examination dated August 08, 2023 with redemonstration of a dominant right upper lobe lung mass and large necrotic right hilar nodal mass invading the mediastinum. There is associated narrowing of the right main and interlobar pulmonary arteries and compression of the right upper and middle lobe segmental arterial branches. 4. Innumerable bilateral pulmonary metastatic lesions. 5. Unchanged large left hepatic lobe lesion, concerning for metastatic disease. 6.  Aortic Atherosclerosis (ICD10-I70.0). Electronically Signed   By: Hart Robinsons M.D.   On: 08/14/2023 14:48   DG Chest Port 1 View  Result Date: 08/14/2023 CLINICAL DATA:  History of lung cancer with shortness of breath EXAM: PORTABLE CHEST 1 VIEW COMPARISON:  X-ray 07/21/2019, CT 08/08/2023 FINDINGS: Underinflation with the elevated right hemidiaphragm. Multiple bilateral mass lesions are again seen, right-greater-than-left. Several are increased compared to the prior x-ray. Please correlate with more recent CT examination. No pneumothorax or effusion. Basilar atelectasis. Normal cardiopericardial silhouette. Calcified aorta. IMPRESSION: Multiple bilateral lung masses, increased compared to the prior x-ray. There also new elevation of the right hemidiaphragm. Please correlate with the more recent CT examinations. Electronically Signed   By: Karen Kays M.D.   On: 08/14/2023 10:41   CT Super D Chest Wo Contrast  Result Date: 08/08/2023 CLINICAL DATA:  Non-small cell lung cancer.  Pre EMB and biopsy. EXAM: CT CHEST WITHOUT CONTRAST TECHNIQUE: Multidetector CT imaging of the chest was performed using thin slice collimation for electromagnetic bronchoscopy  planning purposes, without intravenous contrast. RADIATION DOSE REDUCTION: This exam was performed according to the departmental dose-optimization program which includes automated exposure control, adjustment of the mA and/or kV according to patient size and/or use of iterative reconstruction  technique. COMPARISON:  Chest CT 07/21/2023 FINDINGS: Cardiovascular: The heart is normal in size. No pericardial effusion. Stable age advanced aortic and coronary artery calcifications. Calcifications also noted around the aortic valve. Mediastinum/Nodes: Stable large necrotic mass of adenopathy involving the right hilum and invading the mediastinum. This is compressing the bronchus intermedius. The esophagus is grossly normal. Lungs/Pleura: Stable large right upper lobe lung mass measuring approximately 5.8 x 5.4 cm. Enlarging innumerable pulmonary metastatic lesions bilaterally. Upper Abdomen: Large left hepatic lobe lesion measuring approximately 7 cm. No adrenal gland masses. No upper abdominal adenopathy. Stable age advanced aortic calcifications. Suspect cholelithiasis. Musculoskeletal: No findings for osseous metastatic disease. IMPRESSION: 1. Stable large right upper lobe lung mass with associated large necrotic mass of adenopathy involving the right hilum and invading the mediastinum. 2. Enlarging innumerable pulmonary metastatic lesions bilaterally. 3. Large left hepatic lobe lesion. Aortic Atherosclerosis (ICD10-I70.0). Electronically Signed   By: Rudie Meyer M.D.   On: 08/08/2023 10:32   DG C-Arm 1-60 Min-No Report  Result Date: 08/08/2023 Fluoroscopy was utilized by the requesting physician.  No radiographic interpretation.    Microbiology: Results for orders placed or performed during the hospital encounter of 08/14/23  SARS Coronavirus 2 by RT PCR (hospital order, performed in [facility] hospital lab) *cepheid single result test* Anterior Nasal Swab     Status: Abnormal   Collection Time: 08/14/23 10:13 AM   Specimen: Anterior Nasal Swab  Result Value Ref Range Status   SARS Coronavirus 2 by RT PCR POSITIVE (A) NEGATIVE Final    Comment: (NOTE) SARS-CoV-2 target nucleic acids are DETECTED  SARS-CoV-2 RNA is generally detectable in upper respiratory specimens  during the acute  phase of infection.  Positive results are indicative  of the presence of the identified virus, but do not rule out bacterial infection or co-infection with other pathogens not detected by the test.  Clinical correlation with patient history and  other diagnostic information is necessary to determine patient infection status.  The expected result is negative.  Fact Sheet for Patients:   RoadLapTop.co.za   Fact Sheet for Healthcare Providers:   http://kim-miller.com/    This test is not yet approved or cleared by the [place] FDA and  has been authorized for detection and/or diagnosis of SARS-CoV-2 by FDA under an Emergency Use Authorization (EUA).  This EUA will remain in effect (meaning this test can be used) for the duration of  the COVID-19 declaration under Section 564(b)(1)  of the Act, 21 U.S.C. section 360-bbb-3(b)(1), unless the authorization is terminated or revoked sooner.   Performed at [facility], [address] [place], [place] [address]   Culture, blood (routine x 2)     Status: None   Collection Time: 08/14/23  4:27 PM   Specimen: BLOOD  Result Value Ref Range Status   Specimen Description   Final    BLOOD RIGHT ANTECUBITAL Performed at [facility], [address], [place], [place] [address]    Special Requests   Final    BOTTLES DRAWN AEROBIC AND ANAEROBIC Blood Culture adequate volume Performed at [facility], [address] [place], [place] [address]    Culture   Final    NO GROWTH 5 DAYS Performed at [facility], [address] [place], [place] [address]    Report Status 08/19/2023 FINAL  Final  Culture, blood (routine x 2)     Status: None   Collection Time: 08/14/23  4:42 PM   Specimen: BLOOD  Result Value Ref Range Status   Specimen Description   Final    BLOOD RIGHT ANTECUBITAL Performed at [facility], [address], [place], [place] [address]    Special Requests   Final    BOTTLES DRAWN AEROBIC AND ANAEROBIC Blood Culture adequate volume Performed at [facility], [address] [place], [place] [address]    Culture   Final    NO GROWTH 5 DAYS Performed at [facility], [address], [place], [place] [address]    Report Status 08/19/2023 FINAL  Final   Labs: CBC: Recent Labs  Lab 08/14/23 0953 08/15/23 0414 08/16/23 1036  WBC 17.7* 14.7* 19.4*  NEUTROABS 15.8*  --  16.9*  HGB 12.3 10.7* 10.3*  HCT 38.9 33.6* 32.0*  MCV 96.3 95.2 95.0  PLT 325 277 312   Basic Metabolic Panel: Recent Labs  Lab 08/14/23 0953 08/15/23 0414 08/16/23 1036  NA 132* 130* 133*  K 3.8 3.4* 3.4*  CL 95* 94* 93*  CO2 24 27 29   GLUCOSE 133* 139* 118*  BUN 11 13 16   CREATININE 0.70 1.01* 0.85  CALCIUM 9.1 8.8* 9.0  MG  --   --  2.4   Liver Function Tests: Recent Labs  Lab 08/16/23 1036  AST 35  ALT 25  ALKPHOS 91  BILITOT 1.0  PROT 7.0  ALBUMIN 3.3*   CBG: No results for input(s): "GLUCAP" in the last 168 hours.  Discharge time spent: greater than 30 minutes.  Author: Lynden Oxford, MD  [facility] 08/17/2023

## 2023-08-22 ENCOUNTER — Ambulatory Visit: Payer: No Typology Code available for payment source | Attending: Radiation Oncology

## 2023-08-22 ENCOUNTER — Other Ambulatory Visit: Payer: Self-pay

## 2023-08-22 ENCOUNTER — Ambulatory Visit
Admission: RE | Admit: 2023-08-22 | Discharge: 2023-08-22 | Disposition: A | Discharge status: Home / Self Care | Payer: No Typology Code available for payment source | Source: Ambulatory Visit | Attending: Radiation Oncology | Admitting: Radiation Oncology

## 2023-08-22 DIAGNOSIS — Z510 Encounter for antineoplastic radiation therapy: Secondary | ICD-10-CM | POA: Diagnosis not present

## 2023-08-22 LAB — RAD ONC ARIA SESSION SUMMARY
Course Elapsed Days: 6
Plan Fractions Treated to Date: 5
Plan Prescribed Dose Per Fraction: 2.5 Gy
Plan Total Fractions Prescribed: 14
Plan Total Prescribed Dose: 35 Gy
Reference Point Dosage Given to Date: 12.5 Gy
Reference Point Session Dosage Given: 2.5 Gy
Session Number: 5

## 2023-08-23 ENCOUNTER — Other Ambulatory Visit: Payer: Self-pay

## 2023-08-23 ENCOUNTER — Telehealth: Payer: Self-pay | Admitting: Medical Oncology

## 2023-08-23 ENCOUNTER — Ambulatory Visit
Admission: RE | Admit: 2023-08-23 | Discharge: 2023-08-23 | Disposition: A | Discharge status: Home / Self Care | Payer: No Typology Code available for payment source | Source: Ambulatory Visit | Attending: Radiation Oncology | Admitting: Radiation Oncology

## 2023-08-23 DIAGNOSIS — Z510 Encounter for antineoplastic radiation therapy: Secondary | ICD-10-CM | POA: Diagnosis not present

## 2023-08-23 LAB — RAD ONC ARIA SESSION SUMMARY
Course Elapsed Days: 7
Plan Fractions Treated to Date: 6
Plan Prescribed Dose Per Fraction: 2.5 Gy
Plan Total Fractions Prescribed: 14
Plan Total Prescribed Dose: 35 Gy
Reference Point Dosage Given to Date: 15 Gy
Reference Point Session Dosage Given: 2.5 Gy
Session Number: 6

## 2023-08-23 NOTE — Telephone Encounter (Signed)
Records faxed per [facility] request.

## 2023-08-23 NOTE — Progress Notes (Deleted)
Hospital follow up  Assessment and Plan: Hospital visit follow up for:      All medications were reviewed with patient and family and fully reconciled. All questions answered fully, and patient and family members were encouraged to call the office with any further questions or concerns. Discussed goal to avoid readmission related to this diagnosis.  There are no discontinued medications.  CAN NOT DO FOR BCBS REGULAR OR MEDICARE***  Over 40 minutes of exam, counseling, chart review, and complex, high/moderate level critical decision making was performed this visit.   Future Appointments  Date Time Provider Department Center  08/23/2023  4:30 PM [facility] [id number] [facility] None  08/24/2023  8:15 AM [facility] LAB [facility] None  08/24/2023  8:45 AM Si Gaul, MD [facility] None  08/24/2023  4:30 PM [facility] [id number] [facility] None  08/25/2023 10:30 AM Raynelle Dick, NP [facility] None  08/25/2023 12:45 PM [facility] LINAC 4 [facility] None  08/28/2023  3:00 PM [facility] LINAC 3 [facility] None  08/29/2023 12:15 PM [facility] LINAC 4 [facility] None  08/29/2023 12:30 PM [facility] None  08/30/2023 12:35 PM [facility] [id number] [facility] None  08/31/2023  2:15 PM [facility] LINAC 3 [facility] None  09/01/2023  8:30 AM [facility] LINAC 4 [facility] None  09/04/2023  2:15 PM [facility] [id number] [facility] None  09/21/2023 10:00 AM Parrett, Tammy S, NP [facility] None  10/16/2023  2:00 PM Raynelle Dick, NP [facility] None     HPI 62 y.o.female presents for follow up for transition from recent hospitalization or SNIF stay. Admit date to the hospital was 08/14/23, patient was discharged from the hospital on 08/17/23 and our clinical staff contacted the office the day after discharge to set up a follow up appointment. The discharge summary, medications, and diagnostic test results were reviewed before meeting with the patient.  The patient was admitted for:   Sepsis (HCC), POA secondary to postobstructive pneumonia -Met criteria for sepsis due to tachycardia, tachypnea, leukocytosis, suspected postobstructive pneumonia -Started on IV Zosyn, blood cultures negative for 48 hours. -CTA chest showed no PE, new patchy groundglass and consolidative changes in the right midlung likely related to postobstructive infectious/inflammatory etiology Does not require steroids for infection purposes. Will reduce prednisone to 50 mg daily.   COVID-19 virus infection, likely incidental diagnosed on 10/14. -Currently no hypoxia, not started on COVID-specific therapy -Monitor for any fevers, hypoxia.  Continue airborne precautions out of isolation on 10/25 per hospital protocol.   Non-small cell lung cancer [facility]), metastatic Recent diagnosis. Bronchoscopy 10/8 with Dr. Delton Coombes. PET scan 10/11. - CTA chest showed postobstructive pneumonia, right upper lobe lung mass and large necrotic right hilar nodal mass invading the mediastinum, bilateral pulmonary metastatic lesions, large left hepatic lobe lesion concerning for metastatic disease -Oncology and radiation oncology consulted, Started on radiation. Oncology recommended to continue steroid for anti-inflammatory effect.  Will reduce from Solu-Medrol 40 every 12 to prednisone 50 daily.  Patient will follow-up with outpatient oncology and radiation oncology for taper.   Essential hypertension -BP stable hold losartan/hydrochlorothiazide   Anxiety -Continue Xanax as needed   Cough. Medication adjusted.  Monitor.   Anorexia. In the setting of malignancy.  Monitor.   Hypokalemia. Replaced. Monitor.   Anemia. H&H stable. Will monitor. B12 relatively low.  Will supplement.   Hyponatremia. Clinically nonsignificant.   Leukocytosis. Secondary to steroid or other infection. No further workup.   Pain control - [employer] Controlled Substance Reporting System database  was reviewed. and patient was instructed, not to drive, operate heavy  machinery, perform activities at heights, swimming or participation in water activities or provide baby-sitting services while on Pain, Sleep and Anxiety Medications; until their outpatient Physician has advised to do so again. Also recommended to not to take more than prescribed Pain, Sleep and Anxiety Medications.  Consultants:  Oncology Radiation oncology   Procedures performed:  Radiation      Home health {ACTION; IS/IS [id number]} involved.   Images while in the hospital: No results found.   Current Outpatient Medications (Endocrine & Metabolic):    predniSONE (DELTASONE) 50 MG tablet, Take 1 tablet (50 mg total) by mouth daily with breakfast.   Current Outpatient Medications (Respiratory):    albuterol (VENTOLIN HFA) 108 (90 Base) MCG/ACT inhaler, Inhale 2 puffs into the lungs every 6 (six) hours as needed for wheezing or shortness of breath.   benzonatate (TESSALON) 100 MG capsule, Take 1 capsule (100 mg total) by mouth 3 (three) times daily.   dextromethorphan-guaiFENesin (MUCINEX DM) 30-600 MG 12hr tablet, Take 1 tablet by mouth 2 (two) times daily.   HYDROcodone bit-homatropine (HYCODAN) 5-1.5 MG/5ML syrup, Take 5 mLs by mouth every 4 (four) hours as needed for cough.  Current Outpatient Medications (Analgesics):    acetaminophen (TYLENOL) 500 MG tablet, Take 1,000 mg by mouth every 8 (eight) hours as needed for mild pain.   HYDROcodone-acetaminophen (NORCO/VICODIN) 5-325 MG tablet, Take 1 tablet by mouth every 6 (six) hours as needed. (Patient not taking: Reported on 08/14/2023)   meloxicam (MOBIC) 15 MG tablet, Take 1/2 to 1 tablet  Daily  with Food  for Pain & Inflammation (Patient taking differently: Take 7.5-15 mg by mouth See admin instructions. Take 7.5-15 mg by mouth once a day for pain and inflammation)  Current Outpatient Medications (Hematological):    cyanocobalamin 1000 MCG tablet, Take 1  tablet (1,000 mcg total) by mouth daily.  Current Outpatient Medications (Other):    ALPRAZolam (XANAX) 0.5 MG tablet, Take 1 tablet (0.5 mg total) by mouth 2 (two) times daily as needed for anxiety. (Patient taking differently: Take 0.5 mg by mouth See admin instructions. Take 0.5 mg by mouth at bedtime and an additional 0.5 mg once a day as needed for anxiety)   amoxicillin-clavulanate (AUGMENTIN) 875-125 MG tablet, Take 1 tablet by mouth 2 (two) times daily for 7 days.   lidocaine (LIDODERM) 5 %, Place 1 patch onto the skin every 12 (twelve) hours. Remove & Discard patch within 12 hours or as directed by MD (Patient not taking: Reported on 08/14/2023)   ondansetron (ZOFRAN) 4 MG tablet, Take 1 tablet (4 mg total) by mouth every 6 (six) hours as needed for nausea.  Past Medical History:  Diagnosis Date   Anemia    Asthma, mild intermittent, well-controlled    GERD (gastroesophageal reflux disease)    Hx of migraines    Hyperlipidemia    Hypertension    Vitamin D deficiency      Allergies  Allergen Reactions   Biaxin [Clarithromycin] Nausea Only    ROS: all negative except above.   Physical Exam: There were no vitals filed for this visit. There were no vitals taken for this visit. General Appearance: Well nourished, in no apparent distress. Eyes: PERRLA, EOMs, conjunctiva no swelling or erythema Sinuses: No Frontal/maxillary tenderness ENT/Mouth: Ext aud canals clear, TMs without erythema, bulging. No erythema, swelling, or exudate on post pharynx.  Tonsils not swollen or erythematous. Hearing normal.  Neck: Supple, thyroid normal.  Respiratory: Respiratory effort normal, BS equal bilaterally without rales, rhonchi,  wheezing or stridor.  Cardio: RRR with no MRGs. Brisk peripheral pulses without edema.  Abdomen: Soft, + BS.  Non tender, no guarding, rebound, hernias, masses. Lymphatics: Non tender without lymphadenopathy.  Musculoskeletal: Full ROM, 5/5 strength, normal gait.   Skin: Warm, dry without rashes, lesions, ecchymosis.  Neuro: Cranial nerves intact. Normal muscle tone, no cerebellar symptoms. Sensation intact.  Psych: Awake and oriented X 3, normal affect, Insight and Judgment appropriate.     Raynelle Dick, NP 1:35 PM [facility]

## 2023-08-24 ENCOUNTER — Ambulatory Visit
Admission: RE | Admit: 2023-08-24 | Discharge: 2023-08-24 | Disposition: A | Discharge status: Home / Self Care | Payer: No Typology Code available for payment source | Source: Ambulatory Visit | Attending: Radiation Oncology | Admitting: Radiation Oncology

## 2023-08-24 ENCOUNTER — Other Ambulatory Visit: Payer: Self-pay

## 2023-08-24 ENCOUNTER — Inpatient Hospital Stay: Payer: No Typology Code available for payment source | Admitting: Internal Medicine

## 2023-08-24 ENCOUNTER — Encounter: Payer: Self-pay | Admitting: Internal Medicine

## 2023-08-24 ENCOUNTER — Inpatient Hospital Stay: Payer: No Typology Code available for payment source

## 2023-08-24 VITALS — BP 128/79 | HR 106 | Temp 98.5°F | Resp 17 | Ht 65.0 in | Wt 170.2 lb

## 2023-08-24 DIAGNOSIS — C3411 Malignant neoplasm of upper lobe, right bronchus or lung: Secondary | ICD-10-CM | POA: Insufficient documentation

## 2023-08-24 DIAGNOSIS — C3490 Malignant neoplasm of unspecified part of unspecified bronchus or lung: Secondary | ICD-10-CM

## 2023-08-24 DIAGNOSIS — Z510 Encounter for antineoplastic radiation therapy: Secondary | ICD-10-CM | POA: Diagnosis not present

## 2023-08-24 DIAGNOSIS — N939 Abnormal uterine and vaginal bleeding, unspecified: Secondary | ICD-10-CM | POA: Insufficient documentation

## 2023-08-24 DIAGNOSIS — Z79899 Other long term (current) drug therapy: Secondary | ICD-10-CM | POA: Insufficient documentation

## 2023-08-24 DIAGNOSIS — Z792 Long term (current) use of antibiotics: Secondary | ICD-10-CM | POA: Insufficient documentation

## 2023-08-24 DIAGNOSIS — R059 Cough, unspecified: Secondary | ICD-10-CM | POA: Insufficient documentation

## 2023-08-24 DIAGNOSIS — C7951 Secondary malignant neoplasm of bone: Secondary | ICD-10-CM | POA: Insufficient documentation

## 2023-08-24 DIAGNOSIS — C787 Secondary malignant neoplasm of liver and intrahepatic bile duct: Secondary | ICD-10-CM | POA: Insufficient documentation

## 2023-08-24 LAB — CBC WITH DIFFERENTIAL (CANCER CENTER ONLY)
Abs Immature Granulocytes: 0.07 10*3/uL (ref 0.00–0.07)
Basophils Absolute: 0 10*3/uL (ref 0.0–0.1)
Basophils Relative: 0 %
Eosinophils Absolute: 0.1 10*3/uL (ref 0.0–0.5)
Eosinophils Relative: 1 %
HCT: 33.8 % — ABNORMAL LOW (ref 36.0–46.0)
Hemoglobin: 11.1 g/dL — ABNORMAL LOW (ref 12.0–15.0)
Immature Granulocytes: 1 %
Lymphocytes Relative: 13 %
Lymphs Abs: 1.3 10*3/uL (ref 0.7–4.0)
MCH: 30.6 pg (ref 26.0–34.0)
MCHC: 32.8 g/dL (ref 30.0–36.0)
MCV: 93.1 fL (ref 80.0–100.0)
Monocytes Absolute: 0.8 10*3/uL (ref 0.1–1.0)
Monocytes Relative: 8 %
Neutro Abs: 7.4 10*3/uL (ref 1.7–7.7)
Neutrophils Relative %: 77 %
Platelet Count: 316 10*3/uL (ref 150–400)
RBC: 3.63 MIL/uL — ABNORMAL LOW (ref 3.87–5.11)
RDW: 13.6 % (ref 11.5–15.5)
WBC Count: 9.6 10*3/uL (ref 4.0–10.5)
nRBC: 0 % (ref 0.0–0.2)

## 2023-08-24 LAB — RAD ONC ARIA SESSION SUMMARY
Course Elapsed Days: 8
Plan Fractions Treated to Date: 7
Plan Prescribed Dose Per Fraction: 2.5 Gy
Plan Total Fractions Prescribed: 14
Plan Total Prescribed Dose: 35 Gy
Reference Point Dosage Given to Date: 17.5 Gy
Reference Point Session Dosage Given: 2.5 Gy
Session Number: 7

## 2023-08-24 LAB — CMP (CANCER CENTER ONLY)
ALT: 50 U/L — ABNORMAL HIGH (ref 0–44)
AST: 39 U/L (ref 15–41)
Albumin: 3.4 g/dL — ABNORMAL LOW (ref 3.5–5.0)
Alkaline Phosphatase: 101 U/L (ref 38–126)
Anion gap: 6 (ref 5–15)
BUN: 10 mg/dL (ref 8–23)
CO2: 31 mmol/L (ref 22–32)
Calcium: 8.9 mg/dL (ref 8.9–10.3)
Chloride: 104 mmol/L (ref 98–111)
Creatinine: 0.75 mg/dL (ref 0.44–1.00)
GFR, Estimated: >60 mL/min (ref >60)
Glucose, Bld: 99 mg/dL (ref 70–99)
Potassium: 3.3 mmol/L — ABNORMAL LOW (ref 3.5–5.1)
Sodium: 141 mmol/L (ref 135–145)
Total Bilirubin: 0.6 mg/dL (ref 0.3–1.2)
Total Protein: 6.5 g/dL (ref 6.5–8.1)

## 2023-08-24 MED ORDER — LIDOCAINE-PRILOCAINE 2.5-2.5 % EX CREA: TOPICAL_CREAM | CUTANEOUS | 3 refills | Status: DC

## 2023-08-24 MED ORDER — HYDROCODONE BIT-HOMATROP MBR 5-1.5 MG/5ML PO SOLN: 5.0000 mL | ORAL | 0 refills | Status: DC | PRN

## 2023-08-24 MED ORDER — ONDANSETRON HCL 8 MG PO TABS: 8.0000 mg | ORAL_TABLET | Freq: Three times a day (TID) | ORAL | 1 refills | Status: DC | PRN

## 2023-08-24 MED ORDER — PROCHLORPERAZINE MALEATE 10 MG PO TABS: 10.0000 mg | ORAL_TABLET | Freq: Four times a day (QID) | ORAL | 1 refills | Status: DC | PRN

## 2023-08-24 MED ORDER — PREDNISONE 10 MG PO TABS: ORAL_TABLET | ORAL | 0 refills | Status: DC

## 2023-08-24 NOTE — Progress Notes (Addendum)
[facility] Telephone:([phone]) [phone]   [phone]  OFFICE PROGRESS NOTE  [name], MD [address] [place] [address]  DIAGNOSIS:  Stage IVB ( T3, N2, M1c) non-Small Cell Lung Cancer, poorly differentiated squamous cell carcinoma presented with large right upper lobe lung mass in addition to large necrotic right hilar/mediastinal mass and innumerable hypermetabolic pulmonary metastatic lesions in addition to liver and bone metastasis as well as abdominal celiac axis lymphadenopathy diagnosed in October 2024.  PRIOR THERAPY: Palliative radiotherapy to the large hilar and mediastinal mass under the care of Dr. Roselind Messier  CURRENT THERAPY: Systemic chemotherapy with carboplatin for AUC of 5, paclitaxel 175 Mg/M2 and Keytruda 200 Mg IV every 3 weeks with Neulasta support.  First dose September 06, 2023.  INTERVAL HISTORY: Rebecca Ochoa 62 y.o. female returns to the clinic today for hospital follow-up visit accompanied by her husband.   Discussed the use of AI scribe software for clinical note transcription with the patient, who gave verbal consent to proceed.  History of Present Illness   Rebecca Ochoa, a 62 year old patient, was diagnosed with stage four non-small cell lung cancer (squamous cell carcinoma) in October 2024. The patient presented with a large mass in the right upper part of the lung, significant hilar and mediastinal lymph nodes in the chest obstructing the airway, and metastases in the lymph nodes, bones, and liver. The patient was recently hospitalized due to breathing difficulties, cough, and post-obstructive pneumonia, possibly resulting from a biopsy.  Since hospital discharge, the patient reports feeling significantly better, with only one dose of amoxicillin remaining and ongoing prednisone treatment. However, the patient experiences severe fatigue and shortness of breath, particularly after exertion such as going to the  bathroom. The patient is currently undergoing radiation therapy, with the last session scheduled for November 4th.  In addition to the primary cancer diagnosis, the patient has been experiencing gynecological issues. Following a D&C procedure in August due to abnormal bleeding, the patient reports continued spotting. The patient has not consulted a gynecologist about this recent development, but previous consultations did not identify any cancerous cells.  The patient also reports a persistent cough, particularly noticeable between 3:30 and 4:00 AM. The patient has been managing this with Hycodan cough syrup, taken an hour before radiation therapy and upon waking due to coughing. The patient is nearing the end of her Tessalon prescription and may require a refill.  The patient has been prescribed a B12 supplement and is taking iron supplements to manage mild anemia. The patient's hemoglobin level is slightly above 11.      MEDICAL HISTORY: Past Medical History:  Diagnosis Date   Anemia    Asthma, mild intermittent, well-controlled    GERD (gastroesophageal reflux disease)    Hx of migraines    Hyperlipidemia    Hypertension    Vitamin D deficiency     ALLERGIES:  is allergic to biaxin [clarithromycin].  MEDICATIONS:  Current Outpatient Medications  Medication Sig Dispense Refill   acetaminophen (TYLENOL) 500 MG tablet Take 1,000 mg by mouth every 8 (eight) hours as needed for mild pain.     albuterol (VENTOLIN HFA) 108 (90 Base) MCG/ACT inhaler Inhale 2 puffs into the lungs every 6 (six) hours as needed for wheezing or shortness of breath. 8 g 2   ALPRAZolam (XANAX) 0.5 MG tablet Take 1 tablet (0.5 mg total) by mouth 2 (two) times daily as needed for anxiety. (Patient taking differently: Take 0.5 mg  by mouth See admin instructions. Take 0.5 mg by mouth at bedtime and an additional 0.5 mg once a day as needed for anxiety) 60 tablet 0   amoxicillin-clavulanate (AUGMENTIN) 875-125 MG tablet  Take 1 tablet by mouth 2 (two) times daily for 7 days. 14 tablet 0   benzonatate (TESSALON) 100 MG capsule Take 1 capsule (100 mg total) by mouth 3 (three) times daily. 20 capsule 0   cyanocobalamin 1000 MCG tablet Take 1 tablet (1,000 mcg total) by mouth daily. 30 tablet 0   dextromethorphan-guaiFENesin (MUCINEX DM) 30-600 MG 12hr tablet Take 1 tablet by mouth 2 (two) times daily. 60 tablet 0   HYDROcodone bit-homatropine (HYCODAN) 5-1.5 MG/5ML syrup Take 5 mLs by mouth every 4 (four) hours as needed for cough. 120 mL 0   HYDROcodone-acetaminophen (NORCO/VICODIN) 5-325 MG tablet Take 1 tablet by mouth every 6 (six) hours as needed. (Patient not taking: Reported on 08/14/2023) 10 tablet 0   lidocaine (LIDODERM) 5 % Place 1 patch onto the skin every 12 (twelve) hours. Remove & Discard patch within 12 hours or as directed by MD (Patient not taking: Reported on 08/14/2023) 10 patch 0   meloxicam (MOBIC) 15 MG tablet Take 1/2 to 1 tablet  Daily  with Food  for Pain & Inflammation (Patient taking differently: Take 7.5-15 mg by mouth See admin instructions. Take 7.5-15 mg by mouth once a day for pain and inflammation) 90 tablet 3   ondansetron (ZOFRAN) 4 MG tablet Take 1 tablet (4 mg total) by mouth every 6 (six) hours as needed for nausea. 20 tablet 0   predniSONE (DELTASONE) 50 MG tablet Take 1 tablet (50 mg total) by mouth daily with breakfast. 10 tablet 0   No current facility-administered medications for this visit.    SURGICAL HISTORY:  Past Surgical History:  Procedure Laterality Date   BREAST SURGERY     reduction   BRONCHIAL BIOPSY  08/08/2023   Procedure: BRONCHIAL BIOPSIES;  Surgeon: Leslye Peer, MD;  Location: [facility] ENDOSCOPY;  Service: Pulmonary;;   BRONCHIAL BRUSHINGS  08/08/2023   Procedure: BRONCHIAL BRUSHINGS;  Surgeon: Leslye Peer, MD;  Location: [facility] ENDOSCOPY;  Service: Pulmonary;;   BRONCHIAL NEEDLE ASPIRATION BIOPSY  08/08/2023   Procedure: BRONCHIAL NEEDLE ASPIRATION  BIOPSIES;  Surgeon: Leslye Peer, MD;  Location: [facility] ENDOSCOPY;  Service: Pulmonary;;   ENDOBRONCHIAL ULTRASOUND Bilateral 08/08/2023   Procedure: ENDOBRONCHIAL ULTRASOUND;  Surgeon: Leslye Peer, MD;  Location: [facility] ENDOSCOPY;  Service: Pulmonary;  Laterality: Bilateral;   HEMOSTASIS CONTROL  08/08/2023   Procedure: HEMOSTASIS CONTROL;  Surgeon: Leslye Peer, MD;  Location: [facility] ENDOSCOPY;  Service: Pulmonary;;   REDUCTION MAMMAPLASTY     TUBAL LIGATION      REVIEW OF SYSTEMS:  Constitutional: positive for fatigue Eyes: negative Ears, nose, mouth, throat, and face: negative Respiratory: positive for cough and dyspnea on exertion Cardiovascular: negative Gastrointestinal: negative Genitourinary:negative Integument/breast: negative Hematologic/lymphatic: negative Musculoskeletal:positive for arthralgias Neurological: negative Behavioral/Psych: negative Endocrine: negative Allergic/Immunologic: negative   PHYSICAL EXAMINATION: General appearance: alert, cooperative, fatigued, and no distress Head: Normocephalic, without obvious abnormality, atraumatic Neck: no adenopathy, no JVD, supple, symmetrical, trachea midline, and thyroid not enlarged, symmetric, no tenderness/mass/nodules Lymph nodes: Cervical, supraclavicular, and axillary nodes normal. Resp: rales RUL Back: symmetric, no curvature. ROM normal. No CVA tenderness. Cardio: regular rate and rhythm, S1, S2 normal, no murmur, click, rub or gallop GI: soft, non-tender; bowel sounds normal; no masses,  no organomegaly Extremities: extremities normal, atraumatic, no cyanosis or edema  Neurologic: Alert and oriented X 3, normal strength and tone. Normal symmetric reflexes. Normal coordination and gait  ECOG PERFORMANCE STATUS: 1 - Symptomatic but completely ambulatory  Blood pressure 128/79, pulse (!) 106, temperature 98.5 F (36.9 C), temperature source Oral, resp. rate 17, height 5\' 5"  (1.651 m), weight 170 lb 3.2 oz (77.2 kg),  SpO2 98%.  LABORATORY DATA: Lab Results  Component Value Date   WBC 19.4 (H) 08/16/2023   HGB 10.3 (L) 08/16/2023   HCT 32.0 (L) 08/16/2023   MCV 95.0 08/16/2023   PLT 312 08/16/2023      Chemistry      Component Value Date/Time   NA 133 (L) 08/16/2023 1036   K 3.4 (L) 08/16/2023 1036   CL 93 (L) 08/16/2023 1036   CO2 29 08/16/2023 1036   BUN 16 08/16/2023 1036   CREATININE 0.85 08/16/2023 1036   CREATININE 0.75 07/31/2023 1355   CREATININE 1.05 10/12/2022 1442      Component Value Date/Time   CALCIUM 9.0 08/16/2023 1036   ALKPHOS 91 08/16/2023 1036   AST 35 08/16/2023 1036   AST 32 07/31/2023 1355   ALT 25 08/16/2023 1036   ALT 19 07/31/2023 1355   BILITOT 1.0 08/16/2023 1036   BILITOT 0.5 07/31/2023 1355       RADIOGRAPHIC STUDIES: MR BRAIN W WO CONTRAST  Result Date: 08/16/2023 CLINICAL DATA:  Non-small cell lung cancer (NSCLC), staging EXAM: MRI HEAD WITHOUT AND WITH CONTRAST TECHNIQUE: Multiplanar, multiecho pulse sequences of the brain and surrounding structures were obtained without and with intravenous contrast. CONTRAST:  8mL GADAVIST GADOBUTROL 1 MMOL/ML IV SOLN COMPARISON:  None Available. FINDINGS: Brain: No acute infarction, hemorrhage, hydrocephalus, extra-axial collection or mass lesion. No pathologic enhancement. Vascular: No pathologic enhancement. No Skull and upper cervical spine: Normal marrow signal. Sinuses/Orbits: Clear sinuses.  No acute orbital findings. Other: No mastoid effusions. IMPRESSION: No evidence of acute intracranial abnormality or metastatic disease. Electronically Signed   By: Feliberto Harts M.D.   On: 08/16/2023 09:26   NM PET Image Initial (PI) Skull Base To Thigh (F-18 FDG)  Result Date: 08/14/2023 CLINICAL DATA:  Initial treatment strategy for non-small cell lung cancer. EXAM: NUCLEAR MEDICINE PET SKULL BASE TO THIGH TECHNIQUE: 8.90 mCi F-18 FDG was injected intravenously. Full-ring PET imaging was performed from the skull  base to thigh after the radiotracer. CT data was obtained and used for attenuation correction and anatomic localization. Fasting blood glucose: 118 mg/dl COMPARISON:  Chest CT 64/40/3474 FINDINGS: Mediastinal blood pool activity: SUV max 2.51 Liver activity: SUV max NA NECK: No hypermetabolic lymph nodes in the neck. Moderate symmetric hypermetabolism in the region of Waldeyer's ring likely inflammatory lymphoid tissue. Incidental CT findings: None. CHEST: 5.5 cm right upper lobe lung mass is hypermetabolic with SUV max of 14.42. Adjacent large necrotic right hilar/mediastinal mass has an SUV max of 1214. Innumerable hypermetabolic pulmonary metastatic lesions. 17 mm right lower lobe lesion has an SUV max of 10.35. 13 mm right middle lobe lesion has an SUV max of 12.07. 14 mm left upper lobe nodule has an SUV max of 10.31. No hypermetabolic breast masses, supraclavicular or axillary adenopathy. Incidental CT findings: Stable age advanced atherosclerotic calcification involving the aorta and coronary arteries. ABDOMEN/PELVIS: Hepatic metastatic disease. 15 mm segment 7 lesion has an SUV max of 7.23. Partially necrotic 7 cm mass and segment 2 has an SUV max of 12.9 E. partially necrotic mass in segment 3 has an SUV max of 12.75. 14 mm celiac  axis node has an SUV max 10.76 No adrenal gland metastasis.  Abdominal lymphadenopathy. Incidental CT findings: Age advanced atherosclerotic calcification involving the aorta and iliac arteries. No aneurysm. Moderate sigmoid colon diverticulosis. SKELETON: Hypermetabolic focus in the lower right scapula has an SUV max of 3.98. No obvious CT abnormality but this is certainly suspicious for an osseous metastasis. Small hypermetabolic focus in the L4 vertebral body has an SUV max of 4.06 and is also suspicious. I do not see any other definite lesions. Incidental CT findings: None. IMPRESSION: 1. 5.5 cm right upper lobe lung mass and large necrotic right hilar/mediastinal mass are  hypermetabolic and consistent with known neoplasm. 2. Innumerable hypermetabolic pulmonary metastatic lesions. 3. Hepatic metastatic disease. 4. Hypermetabolic celiac axis lymph node. 5. Two small hypermetabolic bone lesions in the right scapula and L4 vertebral body are suspicious for osseous metastatic disease. Aortic Atherosclerosis (ICD10-I70.0). Electronically Signed   By: Rudie Meyer M.D.   On: 08/14/2023 14:49   CT Angio Chest PE W/Cm &/Or Wo Cm  Result Date: 08/14/2023 CLINICAL DATA:  Pulmonary embolism (PE) suspected. History of lung cancer. EXAM: CT ANGIOGRAPHY CHEST WITH CONTRAST TECHNIQUE: Multidetector CT imaging of the chest was performed using the standard protocol during bolus administration of intravenous contrast. Multiplanar CT image reconstructions and MIPs were obtained to evaluate the vascular anatomy. RADIATION DOSE REDUCTION: This exam was performed according to the departmental dose-optimization program which includes automated exposure control, adjustment of the mA and/or kV according to patient size and/or use of iterative reconstruction technique. CONTRAST:  75mL OMNIPAQUE IOHEXOL 350 MG/ML SOLN COMPARISON:  CT chest dated August 08, 2023. FINDINGS: Cardiovascular: Satisfactory opacification of the pulmonary arteries to the segmental level. No evidence of pulmonary embolism. There is narrowing of the right main and interlobar pulmonary arteries and compression of the right upper and middle lobe segmental arterial branches secondary to the large necrotic right hilar nodal mass, described below. Normal heart size. Multivessel coronary artery calcifications. Atherosclerotic calcification of the thoracic aorta. No pericardial effusion. Mediastinum/Nodes: Redemonstration of a large necrotic nodal mass involving the right hilum and invading the mediastinum, similar to the prior exam. The esophagus is grossly unremarkable. Lungs/Pleura: Essentially stable large right upper lobe lung mass  measuring approximately 5.6 x 4.7 cm, variations in measurement may be attributable to differences in slice selection. Innumerable bilateral pulmonary metastatic lesions are again noted. There are new patchy ground-glass and consolidative changes in the right mid lung (series 13, image 53). There is similar compression of the bronchus intermedius secondary to the large necrotic nodal mass of the right hilum. No pneumothorax. Upper Abdomen: Unchanged large left hepatic heterogenous hypodense lesion measuring up to 7 cm. Otherwise, no acute findings within the visualized upper abdomen. Musculoskeletal: No suspicious osseous lesion. Review of the MIP images confirms the above findings. IMPRESSION: 1. No evidence of acute pulmonary embolism. 2. New patchy ground-glass and consolidative changes in the right mid lung may relate to a postobstructive infectious/inflammatory etiology secondary to the known large right hilar nodal mass. 3. Otherwise, no significant change compared to the prior examination dated August 08, 2023 with redemonstration of a dominant right upper lobe lung mass and large necrotic right hilar nodal mass invading the mediastinum. There is associated narrowing of the right main and interlobar pulmonary arteries and compression of the right upper and middle lobe segmental arterial branches. 4. Innumerable bilateral pulmonary metastatic lesions. 5. Unchanged large left hepatic lobe lesion, concerning for metastatic disease. 6.  Aortic Atherosclerosis (ICD10-I70.0). Electronically Signed  By: Hart Robinsons M.D.   On: 08/14/2023 14:48   DG Chest Port 1 View  Result Date: 08/14/2023 CLINICAL DATA:  History of lung cancer with shortness of breath EXAM: PORTABLE CHEST 1 VIEW COMPARISON:  X-ray 07/21/2019, CT 08/08/2023 FINDINGS: Underinflation with the elevated right hemidiaphragm. Multiple bilateral mass lesions are again seen, right-greater-than-left. Several are increased compared to the prior  x-ray. Please correlate with more recent CT examination. No pneumothorax or effusion. Basilar atelectasis. Normal cardiopericardial silhouette. Calcified aorta. IMPRESSION: Multiple bilateral lung masses, increased compared to the prior x-ray. There also new elevation of the right hemidiaphragm. Please correlate with the more recent CT examinations. Electronically Signed   By: Karen Kays M.D.   On: 08/14/2023 10:41   CT Super D Chest Wo Contrast  Result Date: 08/08/2023 CLINICAL DATA:  Non-small cell lung cancer.  Pre EMB and biopsy. EXAM: CT CHEST WITHOUT CONTRAST TECHNIQUE: Multidetector CT imaging of the chest was performed using thin slice collimation for electromagnetic bronchoscopy planning purposes, without intravenous contrast. RADIATION DOSE REDUCTION: This exam was performed according to the departmental dose-optimization program which includes automated exposure control, adjustment of the mA and/or kV according to patient size and/or use of iterative reconstruction technique. COMPARISON:  Chest CT 07/21/2023 FINDINGS: Cardiovascular: The heart is normal in size. No pericardial effusion. Stable age advanced aortic and coronary artery calcifications. Calcifications also noted around the aortic valve. Mediastinum/Nodes: Stable large necrotic mass of adenopathy involving the right hilum and invading the mediastinum. This is compressing the bronchus intermedius. The esophagus is grossly normal. Lungs/Pleura: Stable large right upper lobe lung mass measuring approximately 5.8 x 5.4 cm. Enlarging innumerable pulmonary metastatic lesions bilaterally. Upper Abdomen: Large left hepatic lobe lesion measuring approximately 7 cm. No adrenal gland masses. No upper abdominal adenopathy. Stable age advanced aortic calcifications. Suspect cholelithiasis. Musculoskeletal: No findings for osseous metastatic disease. IMPRESSION: 1. Stable large right upper lobe lung mass with associated large necrotic mass of  adenopathy involving the right hilum and invading the mediastinum. 2. Enlarging innumerable pulmonary metastatic lesions bilaterally. 3. Large left hepatic lobe lesion. Aortic Atherosclerosis (ICD10-I70.0). Electronically Signed   By: Rudie Meyer M.D.   On: 08/08/2023 10:32   DG C-Arm 1-60 Min-No Report  Result Date: 08/08/2023 Fluoroscopy was utilized by the requesting physician.  No radiographic interpretation.    ASSESSMENT AND PLAN: This is a very pleasant 62 years old white female with Stage IVB ( T3, N2, M1c) non-Small Cell Lung Cancer, poorly differentiated squamous cell carcinoma presented with large right upper lobe lung mass in addition to large necrotic right hilar/mediastinal mass and innumerable hypermetabolic pulmonary metastatic lesions in addition to liver and bone metastasis as well as abdominal celiac axis lymphadenopathy diagnosed in October 2024.    Stage IV Non-Small Cell Lung Cancer (Squamous Cell Carcinoma) Diagnosed in October 2024 with a large mass in the right upper lung, hilar and mediastinal lymph nodes, and metastases to the liver and bones. Currently undergoing radiation therapy with the last session scheduled for September 04, 2023. Experiencing fatigue, shortness of breath, and lack of stamina, likely due to a combination of radiation therapy, recent pneumonia, and the obstructive nature of the tumor. -Plan to start chemotherapy and immunotherapy (Carboplatin, Paclitaxel, and Keytruda) on September 06, 2023. -Continue iron supplementation for mild anemia. -Perform CT scans every three cycles of treatment to monitor response. -Consider placement of a port-a-cath for chemotherapy administration. -Genetic studies of the tumor tissue are pending; if actionable mutations are identified, treatment plan may  be adjusted accordingly.  Post-obstructive Pneumonia Recently hospitalized for breathing difficulties due to obstruction of the airway by the tumor. Currently on  Amoxicillin and Prednisone, with improvement in symptoms. -Taper Prednisone over the next three weeks (30mg  daily for one week, 20mg  daily for one week, 10mg  daily for one week).  Abnormal Uterine Bleeding History of D&C in August 2024 for abnormal uterine bleeding. Currently experiencing spotting. No evidence of gynecological malignancy. -Continue monitoring symptoms. No immediate intervention planned due to current health status.  Cough Persistent cough, particularly in the early morning hours. Currently using Hycodan as needed. -Provide refill for Hycodan to use as needed for cough control.   The patient was advised to call immediately if she has any other concerning symptoms in the interval. The patient voices understanding of current disease status and treatment options and is in agreement with the current care plan.  All questions were answered. The patient knows to call the clinic with any problems, questions or concerns. We can certainly see the patient much sooner if necessary.  The total time spent in the appointment was 55 minutes.  Disclaimer: This note was dictated with voice recognition software. Similar sounding words can inadvertently be transcribed and may not be corrected upon review.

## 2023-08-24 NOTE — Progress Notes (Signed)
START ON PATHWAY REGIMEN - Non-Small Cell Lung     A cycle is every 21 days:     Pembrolizumab      Paclitaxel      Carboplatin   **Always confirm dose/schedule in your pharmacy ordering system**  Patient Characteristics: Stage IV Metastatic, Squamous, Molecular Analysis Not Elected, PS = 0, 1, Initial Chemotherapy/Immunotherapy, Immunotherapy Candidate, PD-L1 Expression Positive 1-49% (TPS) / Negative / Not Tested / Awaiting Test Results Therapeutic Status: Stage IV Metastatic Histology: Squamous Cell ECOG Performance Status: 1 Chemotherapy/Immunotherapy Line of Therapy: Initial Chemotherapy/Immunotherapy Immunotherapy Candidate Status: Candidate for Immunotherapy PD-L1 Expression Status: Awaiting Test Results Intent of Therapy: Non-Curative / Palliative Intent, Discussed with Patient

## 2023-08-25 ENCOUNTER — Telehealth: Payer: Self-pay | Admitting: Hematology and Oncology

## 2023-08-25 ENCOUNTER — Other Ambulatory Visit: Payer: Self-pay

## 2023-08-25 ENCOUNTER — Ambulatory Visit
Admission: RE | Admit: 2023-08-25 | Discharge: 2023-08-25 | Disposition: A | Discharge status: Home / Self Care | Payer: No Typology Code available for payment source | Source: Ambulatory Visit | Attending: Radiation Oncology | Admitting: Radiation Oncology

## 2023-08-25 ENCOUNTER — Inpatient Hospital Stay: Payer: No Typology Code available for payment source | Admitting: Nurse Practitioner

## 2023-08-25 DIAGNOSIS — Z510 Encounter for antineoplastic radiation therapy: Secondary | ICD-10-CM | POA: Diagnosis not present

## 2023-08-25 LAB — RAD ONC ARIA SESSION SUMMARY
Course Elapsed Days: 9
Plan Fractions Treated to Date: 8
Plan Prescribed Dose Per Fraction: 2.5 Gy
Plan Total Fractions Prescribed: 14
Plan Total Prescribed Dose: 35 Gy
Reference Point Dosage Given to Date: 20 Gy
Reference Point Session Dosage Given: 2.5 Gy
Session Number: 8

## 2023-08-25 NOTE — Telephone Encounter (Signed)
Scheduled appointments per referral. Patient is aware of the appointment time and date as well as the address. Patient was informed to arrive 10-15 minutes prior with updated insurance information. All questions were answered.

## 2023-08-25 NOTE — Progress Notes (Signed)
8:56: I called the pt to see if she has any questions regarding her upcoming treatment plan. Pt denied questions about her treatment.  I reviewed her appts and asked if she had question about her port placement. I reviewed the instructions with her--she can't eat or drink after midnight, and sips of water only. She has to have someone drive her there and pick her up and she has to be with a responsible adult for 24hrs after the procedure to monitor for any adverse effects of the anesthesia she is given. Pt appreciated the instructions for her port placement.  9:27-pt called me back asking if her FMLA paperwork would be ready today. I asked her when she turned it in and she said yesterday at her appt w/Dr Arbutus Ped. I explained to the pt that it can take up to 2 weeks for FMLA paperwork to be processed, however it can be retroactively applied to any miss work after its approval. I let the pt know I would get in touch with Natasha Mead, our [facility] authorization specialist. Pt verbalized appreciation.  I reached out to [employer] via email and inquired about the pt's FMLA paperwork. Natasha Mead explained that they had just received it this morning, however there is no ROI with it and the pt needs one for the paperwork to be processed. The pt is coming in today for radiation and I asked Natasha Mead if she could help her today. Natasha Mead states she will call the pt and tell her she will meet with her. I provided the pts name and contact information.

## 2023-08-26 ENCOUNTER — Other Ambulatory Visit: Payer: Self-pay

## 2023-08-28 ENCOUNTER — Other Ambulatory Visit: Payer: Self-pay

## 2023-08-28 ENCOUNTER — Ambulatory Visit
Admission: RE | Admit: 2023-08-28 | Discharge: 2023-08-28 | Disposition: A | Discharge status: Home / Self Care | Payer: No Typology Code available for payment source | Source: Ambulatory Visit | Attending: Radiation Oncology | Admitting: Radiation Oncology

## 2023-08-28 DIAGNOSIS — Z510 Encounter for antineoplastic radiation therapy: Secondary | ICD-10-CM | POA: Diagnosis not present

## 2023-08-28 LAB — RAD ONC ARIA SESSION SUMMARY
Course Elapsed Days: 12
Plan Fractions Treated to Date: 9
Plan Prescribed Dose Per Fraction: 2.5 Gy
Plan Total Fractions Prescribed: 14
Plan Total Prescribed Dose: 35 Gy
Reference Point Dosage Given to Date: 22.5 Gy
Reference Point Session Dosage Given: 2.5 Gy
Session Number: 9

## 2023-08-29 ENCOUNTER — Ambulatory Visit
Admission: RE | Admit: 2023-08-29 | Discharge: 2023-08-29 | Disposition: A | Discharge status: Home / Self Care | Payer: No Typology Code available for payment source | Source: Ambulatory Visit | Attending: Radiation Oncology | Admitting: Radiation Oncology

## 2023-08-29 ENCOUNTER — Telehealth: Payer: Self-pay | Admitting: *Deleted

## 2023-08-29 ENCOUNTER — Other Ambulatory Visit: Payer: Self-pay

## 2023-08-29 DIAGNOSIS — Z510 Encounter for antineoplastic radiation therapy: Secondary | ICD-10-CM | POA: Diagnosis not present

## 2023-08-29 LAB — RAD ONC ARIA SESSION SUMMARY
Course Elapsed Days: 13
Plan Fractions Treated to Date: 10
Plan Prescribed Dose Per Fraction: 2.5 Gy
Plan Total Fractions Prescribed: 14
Plan Total Prescribed Dose: 35 Gy
Reference Point Dosage Given to Date: 25 Gy
Reference Point Session Dosage Given: 2.5 Gy
Session Number: 10

## 2023-08-29 NOTE — Telephone Encounter (Signed)
Connected with Sanna Gunby, [phone] (home) to clarify type of leave.   "The other forms nurse filled it out, I do not know. Have not worked since 08/01/2023.  [employer] has approved my short-term disability however they will need an update on 10/17/2023. This nurse completed form with this information.  Form to to bin for collaborative pick up for provider review, signature and return to this nurse.  08/30/2023 Received two-page [employer] Attending Physician statement upon return to office.  Completed and added to folder for provider review, signature and return.

## 2023-08-30 ENCOUNTER — Ambulatory Visit
Admission: RE | Admit: 2023-08-30 | Discharge: 2023-08-30 | Disposition: A | Discharge status: Home / Self Care | Payer: No Typology Code available for payment source | Source: Ambulatory Visit | Attending: Radiation Oncology | Admitting: Radiation Oncology

## 2023-08-30 ENCOUNTER — Other Ambulatory Visit: Payer: No Typology Code available for payment source

## 2023-08-30 ENCOUNTER — Other Ambulatory Visit: Payer: Self-pay

## 2023-08-30 ENCOUNTER — Encounter (HOSPITAL_COMMUNITY): Payer: Self-pay

## 2023-08-30 DIAGNOSIS — C3411 Malignant neoplasm of upper lobe, right bronchus or lung: Secondary | ICD-10-CM

## 2023-08-30 DIAGNOSIS — Z510 Encounter for antineoplastic radiation therapy: Secondary | ICD-10-CM | POA: Diagnosis not present

## 2023-08-30 LAB — RAD ONC ARIA SESSION SUMMARY
Course Elapsed Days: 14
Plan Fractions Treated to Date: 11
Plan Prescribed Dose Per Fraction: 2.5 Gy
Plan Total Fractions Prescribed: 14
Plan Total Prescribed Dose: 35 Gy
Reference Point Dosage Given to Date: 27.5 Gy
Reference Point Session Dosage Given: 2.5 Gy
Session Number: 11

## 2023-08-31 ENCOUNTER — Other Ambulatory Visit: Payer: Self-pay | Admitting: Internal Medicine

## 2023-08-31 ENCOUNTER — Other Ambulatory Visit (HOSPITAL_COMMUNITY): Payer: Self-pay | Admitting: Student

## 2023-08-31 ENCOUNTER — Ambulatory Visit
Admission: RE | Admit: 2023-08-31 | Discharge: 2023-08-31 | Disposition: A | Discharge status: Home / Self Care | Payer: No Typology Code available for payment source | Source: Ambulatory Visit | Attending: Radiation Oncology | Admitting: Radiation Oncology

## 2023-08-31 ENCOUNTER — Other Ambulatory Visit: Payer: Self-pay

## 2023-08-31 DIAGNOSIS — C3411 Malignant neoplasm of upper lobe, right bronchus or lung: Secondary | ICD-10-CM

## 2023-08-31 DIAGNOSIS — Z510 Encounter for antineoplastic radiation therapy: Secondary | ICD-10-CM | POA: Diagnosis not present

## 2023-08-31 LAB — RAD ONC ARIA SESSION SUMMARY
Course Elapsed Days: 15
Plan Fractions Treated to Date: 12
Plan Prescribed Dose Per Fraction: 2.5 Gy
Plan Total Fractions Prescribed: 14
Plan Total Prescribed Dose: 35 Gy
Reference Point Dosage Given to Date: 30 Gy
Reference Point Session Dosage Given: 2.5 Gy
Session Number: 12

## 2023-08-31 NOTE — H&P (Signed)
Chief Complaint: Metastatic non small cell lung cancer Team is requesting a portacath for chemotherapy access.   Referring Physician(s): Mohamed,Mohamed  Supervising Physician: Roanna Banning  Patient Status: [facility] - Out-pt  History of Present Illness: Rebecca Ochoa is a 62 y.o. female  outpatient. History of HTN. 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 Team is requesting a portacath placement for chemotherapy access.  Endorsees SHOB and nonproductive cough. Patient alert and laying in bed,calm. Denies any fevers, headache, chest pain, abdominal pain, nausea, vomiting or bleeding. Return precautions and treatment recommendations and follow-up discussed with the patient  who is agreeable with the plan.   Past Medical History:  Diagnosis Date   Anemia    Asthma, mild intermittent, well-controlled    GERD (gastroesophageal reflux disease)    Hx of migraines    Hyperlipidemia    Hypertension    Vitamin D deficiency     Past Surgical History:  Procedure Laterality Date   BREAST SURGERY     reduction   BRONCHIAL BIOPSY  08/08/2023   Procedure: BRONCHIAL BIOPSIES;  Surgeon: Leslye Peer, MD;  Location: [facility] ENDOSCOPY;  Service: Pulmonary;;   BRONCHIAL BRUSHINGS  08/08/2023   Procedure: BRONCHIAL BRUSHINGS;  Surgeon: Leslye Peer, MD;  Location: [facility] ENDOSCOPY;  Service: Pulmonary;;   BRONCHIAL NEEDLE ASPIRATION BIOPSY  08/08/2023   Procedure: BRONCHIAL NEEDLE ASPIRATION BIOPSIES;  Surgeon: Leslye Peer, MD;  Location: [facility] ENDOSCOPY;  Service: Pulmonary;;   ENDOBRONCHIAL ULTRASOUND Bilateral 08/08/2023   Procedure: ENDOBRONCHIAL ULTRASOUND;  Surgeon: Leslye Peer, MD;  Location: [facility] ENDOSCOPY;  Service: Pulmonary;  Laterality: Bilateral;   HEMOSTASIS CONTROL  08/08/2023   Procedure: HEMOSTASIS CONTROL;  Surgeon: Leslye Peer, MD;  Location: [facility] ENDOSCOPY;  Service:  Pulmonary;;   REDUCTION MAMMAPLASTY     TUBAL LIGATION      Allergies: Biaxin [clarithromycin]  Medications: Prior to Admission medications   Medication Sig Start Date End Date Taking? Authorizing Provider  acetaminophen (TYLENOL) 500 MG tablet Take 1,000 mg by mouth every 8 (eight) hours as needed for mild pain.    [provider]  albuterol (VENTOLIN HFA) 108 (90 Base) MCG/ACT inhaler Inhale 2 puffs into the lungs every 6 (six) hours as needed for wheezing or shortness of breath. 05/30/23   Raynelle Dick, NP  ALPRAZolam Prudy Feeler) 0.5 MG tablet Take 1 tablet (0.5 mg total) by mouth 2 (two) times daily as needed for anxiety. Patient taking differently: Take 0.5 mg by mouth See admin instructions. Take 0.5 mg by mouth at bedtime and an additional 0.5 mg once a day as needed for anxiety 08/11/23   Raynelle Dick, NP  benzonatate (TESSALON) 100 MG capsule Take 1 capsule (100 mg total) by mouth 3 (three) times daily. 08/17/23   Rolly Salter, MD  cyanocobalamin 1000 MCG tablet Take 1 tablet (1,000 mcg total) by mouth daily. 08/18/23   Rolly Salter, MD  dextromethorphan-guaiFENesin [facility] DM) 30-600 MG 12hr tablet Take 1 tablet by mouth 2 (two) times daily. 08/17/23   Rolly Salter, MD  HYDROcodone bit-homatropine (HYCODAN) 5-1.5 MG/5ML syrup Take 5 mLs by mouth every 4 (four) hours as needed for cough. 08/24/23   Si Gaul, MD  HYDROcodone-acetaminophen (NORCO/VICODIN) 5-325 MG tablet Take 1 tablet by mouth every 6 (six) hours as needed. Patient not taking: Reported on 08/14/2023 07/21/23   Roemhildt, Lorin T,  PA-C  lidocaine (LIDODERM) 5 % Place 1 patch onto the skin every 12 (twelve) hours. Remove & Discard patch within 12 hours or as directed by MD Patient not taking: Reported on 08/14/2023 07/04/23 07/03/24  Raynelle Dick, NP  lidocaine-prilocaine (EMLA) cream Apply to affected area once 09/05/23   Si Gaul, MD  meloxicam (MOBIC) 15 MG tablet Take 1/2 to 1  tablet  Daily  with Food  for Pain & Inflammation Patient taking differently: Take 7.5-15 mg by mouth See admin instructions. Take 7.5-15 mg by mouth once a day for pain and inflammation 07/04/23   Raynelle Dick, NP  ondansetron (ZOFRAN) 4 MG tablet Take 1 tablet (4 mg total) by mouth every 6 (six) hours as needed for nausea. 08/17/23   Rolly Salter, MD  ondansetron (ZOFRAN) 8 MG tablet Take 1 tablet (8 mg total) by mouth every 8 (eight) hours as needed for nausea or vomiting. Start on the third day after carboplatin. 09/05/23   Si Gaul, MD  predniSONE (DELTASONE) 10 MG tablet 3 tablet p.o. daily for 1 week followed by 2 tablet p.o. daily for 1 week followed by 1 tablet p.o. daily for 1 week 08/24/23   Si Gaul, MD  prochlorperazine (COMPAZINE) 10 MG tablet Take 1 tablet (10 mg total) by mouth every 6 (six) hours as needed for nausea or vomiting. 09/05/23   Si Gaul, MD  bisoprolol-hydrochlorothiazide [facility]) 5-6.25 MG tablet Take  1 tablet  Daily  for BP 06/06/22 07/11/22  [name], MD     Family History  Problem Relation Age of Onset   Heart disease Father    Hypertension Father    Diabetes Father    Kidney disease Father     Social History   Socioeconomic History   Marital status: Married    Spouse name: Not on file   Number of children: Not on file   Years of education: Not on file   Highest education level: Not on file  Occupational History   Not on file  Tobacco Use   Smoking status: Every Day    Types: E-cigarettes   Smokeless tobacco: Never  Vaping Use   Vaping status: Every Day  Substance and Sexual Activity   Alcohol use: Yes    Alcohol/week: 3.0 standard drinks of alcohol    Types: 3 Standard drinks or equivalent per week   Drug use: Never   Sexual activity: Not on file  Other Topics Concern   Not on file  Social History Narrative   Not on file   Social Determinants of Health   Financial Resource Strain: Not on file  Food  Insecurity: No Food Insecurity (08/14/2023)   Hunger Vital Sign    Worried About Running Out of Food in the Last Year: Never true    Ran Out of Food in the Last Year: Never true  Transportation Needs: No Transportation Needs (08/14/2023)   PRAPARE - Administrator, Civil Service (Medical): No    Lack of Transportation (Non-Medical): No  Physical Activity: Not on file  Stress: Not on file  Social Connections: Not on file    Review of Systems: A 12 point ROS discussed and pertinent positives are indicated in the HPI above.  All other systems are negative.  Review of Systems  Constitutional:  Negative for fatigue and fever.  HENT:  Negative for congestion.   Respiratory:  Positive for cough and shortness of breath.   Gastrointestinal:  Negative for abdominal  pain, diarrhea, nausea and vomiting.    Vital Signs: BP 125/74   Pulse (!) 112   Temp 98.4 F (36.9 C) (Oral)   Resp 18   Ht 5\' 5"  (1.651 m)   Wt 170 lb 3.2 oz (77.2 kg)   SpO2 94%   BMI 28.32 kg/m     Physical Exam Vitals and nursing note reviewed.  Constitutional:      Appearance: She is well-developed.  HENT:     Head: Normocephalic and atraumatic.     Mouth/Throat:     Mouth: Mucous membranes are dry.  Eyes:     Conjunctiva/sclera: Conjunctivae normal.  Cardiovascular:     Rate and Rhythm: Regular rhythm. Tachycardia present.  Pulmonary:     Effort: Pulmonary effort is normal.  Musculoskeletal:        General: Normal range of motion.     Cervical back: Normal range of motion.  Skin:    General: Skin is warm and dry.  Neurological:     General: No focal deficit present.     Mental Status: She is alert and oriented to person, place, and time.  Psychiatric:        Mood and Affect: Mood normal.        Behavior: Behavior normal.        Thought Content: Thought content normal.        Judgment: Judgment normal.     Imaging: MR BRAIN W WO CONTRAST  Result Date: 08/16/2023 CLINICAL DATA:   Non-small cell lung cancer (NSCLC), staging EXAM: MRI HEAD WITHOUT AND WITH CONTRAST TECHNIQUE: Multiplanar, multiecho pulse sequences of the brain and surrounding structures were obtained without and with intravenous contrast. CONTRAST:  8mL GADAVIST GADOBUTROL 1 MMOL/ML IV SOLN COMPARISON:  None Available. FINDINGS: Brain: No acute infarction, hemorrhage, hydrocephalus, extra-axial collection or mass lesion. No pathologic enhancement. Vascular: No pathologic enhancement. No Skull and upper cervical spine: Normal marrow signal. Sinuses/Orbits: Clear sinuses.  No acute orbital findings. Other: No mastoid effusions. IMPRESSION: No evidence of acute intracranial abnormality or metastatic disease. Electronically Signed   By: Feliberto Harts M.D.   On: 08/16/2023 09:26   NM PET Image Initial (PI) Skull Base To Thigh (F-18 FDG)  Result Date: 08/14/2023 CLINICAL DATA:  Initial treatment strategy for non-small cell lung cancer. EXAM: NUCLEAR MEDICINE PET SKULL BASE TO THIGH TECHNIQUE: 8.90 mCi F-18 FDG was injected intravenously. Full-ring PET imaging was performed from the skull base to thigh after the radiotracer. CT data was obtained and used for attenuation correction and anatomic localization. Fasting blood glucose: 118 mg/dl COMPARISON:  Chest CT 16/08/9603 FINDINGS: Mediastinal blood pool activity: SUV max 2.51 Liver activity: SUV max NA NECK: No hypermetabolic lymph nodes in the neck. Moderate symmetric hypermetabolism in the region of Waldeyer's ring likely inflammatory lymphoid tissue. Incidental CT findings: None. CHEST: 5.5 cm right upper lobe lung mass is hypermetabolic with SUV max of 14.42. Adjacent large necrotic right hilar/mediastinal mass has an SUV max of 1214. Innumerable hypermetabolic pulmonary metastatic lesions. 17 mm right lower lobe lesion has an SUV max of 10.35. 13 mm right middle lobe lesion has an SUV max of 12.07. 14 mm left upper lobe nodule has an SUV max of 10.31. No hypermetabolic  breast masses, supraclavicular or axillary adenopathy. Incidental CT findings: Stable age advanced atherosclerotic calcification involving the aorta and coronary arteries. ABDOMEN/PELVIS: Hepatic metastatic disease. 15 mm segment 7 lesion has an SUV max of 7.23. Partially necrotic 7 cm mass and segment 2 has  an SUV max of 12.9 E. partially necrotic mass in segment 3 has an SUV max of 12.75. 14 mm celiac axis node has an SUV max 10.76 No adrenal gland metastasis.  Abdominal lymphadenopathy. Incidental CT findings: Age advanced atherosclerotic calcification involving the aorta and iliac arteries. No aneurysm. Moderate sigmoid colon diverticulosis. SKELETON: Hypermetabolic focus in the lower right scapula has an SUV max of 3.98. No obvious CT abnormality but this is certainly suspicious for an osseous metastasis. Small hypermetabolic focus in the L4 vertebral body has an SUV max of 4.06 and is also suspicious. I do not see any other definite lesions. Incidental CT findings: None. IMPRESSION: 1. 5.5 cm right upper lobe lung mass and large necrotic right hilar/mediastinal mass are hypermetabolic and consistent with known neoplasm. 2. Innumerable hypermetabolic pulmonary metastatic lesions. 3. Hepatic metastatic disease. 4. Hypermetabolic celiac axis lymph node. 5. Two small hypermetabolic bone lesions in the right scapula and L4 vertebral body are suspicious for osseous metastatic disease. Aortic Atherosclerosis (ICD10-I70.0). Electronically Signed   By: Rudie Meyer M.D.   On: 08/14/2023 14:49   CT Angio Chest PE W/Cm &/Or Wo Cm  Result Date: 08/14/2023 CLINICAL DATA:  Pulmonary embolism (PE) suspected. History of lung cancer. EXAM: CT ANGIOGRAPHY CHEST WITH CONTRAST TECHNIQUE: Multidetector CT imaging of the chest was performed using the standard protocol during bolus administration of intravenous contrast. Multiplanar CT image reconstructions and MIPs were obtained to evaluate the vascular anatomy. RADIATION  DOSE REDUCTION: This exam was performed according to the departmental dose-optimization program which includes automated exposure control, adjustment of the mA and/or kV according to patient size and/or use of iterative reconstruction technique. CONTRAST:  75mL OMNIPAQUE IOHEXOL 350 MG/ML SOLN COMPARISON:  CT chest dated August 08, 2023. FINDINGS: Cardiovascular: Satisfactory opacification of the pulmonary arteries to the segmental level. No evidence of pulmonary embolism. There is narrowing of the right main and interlobar pulmonary arteries and compression of the right upper and middle lobe segmental arterial branches secondary to the large necrotic right hilar nodal mass, described below. Normal heart size. Multivessel coronary artery calcifications. Atherosclerotic calcification of the thoracic aorta. No pericardial effusion. Mediastinum/Nodes: Redemonstration of a large necrotic nodal mass involving the right hilum and invading the mediastinum, similar to the prior exam. The esophagus is grossly unremarkable. Lungs/Pleura: Essentially stable large right upper lobe lung mass measuring approximately 5.6 x 4.7 cm, variations in measurement may be attributable to differences in slice selection. Innumerable bilateral pulmonary metastatic lesions are again noted. There are new patchy ground-glass and consolidative changes in the right mid lung (series 13, image 53). There is similar compression of the bronchus intermedius secondary to the large necrotic nodal mass of the right hilum. No pneumothorax. Upper Abdomen: Unchanged large left hepatic heterogenous hypodense lesion measuring up to 7 cm. Otherwise, no acute findings within the visualized upper abdomen. Musculoskeletal: No suspicious osseous lesion. Review of the MIP images confirms the above findings. IMPRESSION: 1. No evidence of acute pulmonary embolism. 2. New patchy ground-glass and consolidative changes in the right mid lung may relate to a  postobstructive infectious/inflammatory etiology secondary to the known large right hilar nodal mass. 3. Otherwise, no significant change compared to the prior examination dated August 08, 2023 with redemonstration of a dominant right upper lobe lung mass and large necrotic right hilar nodal mass invading the mediastinum. There is associated narrowing of the right main and interlobar pulmonary arteries and compression of the right upper and middle lobe segmental arterial branches. 4. Innumerable bilateral  pulmonary metastatic lesions. 5. Unchanged large left hepatic lobe lesion, concerning for metastatic disease. 6.  Aortic Atherosclerosis (ICD10-I70.0). Electronically Signed   By: Hart Robinsons M.D.   On: 08/14/2023 14:48   DG Chest Port 1 View  Result Date: 08/14/2023 CLINICAL DATA:  History of lung cancer with shortness of breath EXAM: PORTABLE CHEST 1 VIEW COMPARISON:  X-ray 07/21/2019, CT 08/08/2023 FINDINGS: Underinflation with the elevated right hemidiaphragm. Multiple bilateral mass lesions are again seen, right-greater-than-left. Several are increased compared to the prior x-ray. Please correlate with more recent CT examination. No pneumothorax or effusion. Basilar atelectasis. Normal cardiopericardial silhouette. Calcified aorta. IMPRESSION: Multiple bilateral lung masses, increased compared to the prior x-ray. There also new elevation of the right hemidiaphragm. Please correlate with the more recent CT examinations. Electronically Signed   By: Karen Kays M.D.   On: 08/14/2023 10:41   CT Super D Chest Wo Contrast  Result Date: 08/08/2023 CLINICAL DATA:  Non-small cell lung cancer.  Pre EMB and biopsy. EXAM: CT CHEST WITHOUT CONTRAST TECHNIQUE: Multidetector CT imaging of the chest was performed using thin slice collimation for electromagnetic bronchoscopy planning purposes, without intravenous contrast. RADIATION DOSE REDUCTION: This exam was performed according to the departmental  dose-optimization program which includes automated exposure control, adjustment of the mA and/or kV according to patient size and/or use of iterative reconstruction technique. COMPARISON:  Chest CT 07/21/2023 FINDINGS: Cardiovascular: The heart is normal in size. No pericardial effusion. Stable age advanced aortic and coronary artery calcifications. Calcifications also noted around the aortic valve. Mediastinum/Nodes: Stable large necrotic mass of adenopathy involving the right hilum and invading the mediastinum. This is compressing the bronchus intermedius. The esophagus is grossly normal. Lungs/Pleura: Stable large right upper lobe lung mass measuring approximately 5.8 x 5.4 cm. Enlarging innumerable pulmonary metastatic lesions bilaterally. Upper Abdomen: Large left hepatic lobe lesion measuring approximately 7 cm. No adrenal gland masses. No upper abdominal adenopathy. Stable age advanced aortic calcifications. Suspect cholelithiasis. Musculoskeletal: No findings for osseous metastatic disease. IMPRESSION: 1. Stable large right upper lobe lung mass with associated large necrotic mass of adenopathy involving the right hilum and invading the mediastinum. 2. Enlarging innumerable pulmonary metastatic lesions bilaterally. 3. Large left hepatic lobe lesion. Aortic Atherosclerosis (ICD10-I70.0). Electronically Signed   By: Rudie Meyer M.D.   On: 08/08/2023 10:32   DG C-Arm 1-60 Min-No Report  Result Date: 08/08/2023 Fluoroscopy was utilized by the requesting physician.  No radiographic interpretation.    Labs:  CBC: Recent Labs    08/14/23 0953 08/15/23 0414 08/16/23 1036 08/24/23 0819  WBC 17.7* 14.7* 19.4* 9.6  HGB 12.3 10.7* 10.3* 11.1*  HCT 38.9 33.6* 32.0* 33.8*  PLT 325 277 312 316    COAGS: Recent Labs    08/14/23 0953 08/16/23 1036  INR 1.1 1.1    BMP: Recent Labs    08/14/23 0953 08/15/23 0414 08/16/23 1036 08/24/23 0819  NA 132* 130* 133* 141  K 3.8 3.4* 3.4* 3.3*  CL  95* 94* 93* 104  CO2 24 27 29 31   GLUCOSE 133* 139* 118* 99  BUN 11 13 16 10   CALCIUM 9.1 8.8* 9.0 8.9  CREATININE 0.70 1.01* 0.85 0.75  GFRNONAA >60 >60 >60 >60    LIVER FUNCTION TESTS: Recent Labs    10/12/22 1442 07/31/23 1355 08/16/23 1036 08/24/23 0819  BILITOT 0.5 0.5 1.0 0.6  AST 21 32 35 39  ALT 34* 19 25 50*  ALKPHOS  --  107 91 101  PROT 7.8 7.4  7.0 6.5  ALBUMIN  --  4.0 3.3* 3.4*     Assessment and Plan:  62 y.o. female outpatient. History of HTN. 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 Team is requesting a portacath placement for chemotherapy access.   Per EPIC not line history. CT Angio from 10.13.24 reads Otherwise, no significant change compared to the prior examination dated August 08, 2023 with redemonstration of a dominant right upper lobe lung mass and large necrotic right hilar nodal mass invading the mediastinum. There is associated narrowing of the right main and interlobar pulmonary arteries and compression of the right upper and middle lobe segmental arterial branches. Labs from 10.24.24 and medications are within acceptable parameters. No pertinent allergies. Patient has been NPO since midnight.   Risks and benefits of image guided port-a-catheter placement was discussed with the patient including, but not limited to bleeding, infection, pneumothorax, or fibrin sheath development and need for additional procedures.  All of the patient's questions were answered, patient is agreeable to proceed. Consent signed and in chart.   Thank you for this interesting consult.  I greatly enjoyed meeting Adi Seales and look forward to participating in their care.  A copy of this report was sent to the requesting provider on this date.  Electronically Signed: Alene Mires, NP 09/01/2023, 8:01 AM   I spent a total of  30 Minutes   in face to  face in clinical consultation, greater than 50% of which was counseling/coordinating care for portacath placement

## 2023-08-31 NOTE — Progress Notes (Deleted)
[facility] OFFICE PROGRESS NOTE  [name], MD [address] [place] [address]  DIAGNOSIS: Stage IVB ( T3, N2, M1c) non-Small Cell Lung Cancer, poorly differentiated squamous cell carcinoma presented with large right upper lobe lung mass in addition to large necrotic right hilar/mediastinal mass and innumerable hypermetabolic pulmonary metastatic lesions in addition to liver and bone metastasis as well as abdominal celiac axis lymphadenopathy diagnosed in October 2024.   PDL1: 0%  PRIOR THERAPY: Palliative radiotherapy to the large hilar and mediastinal mass under the care of Dr. Roselind Messier last dose on 09/04/23  CURRENT THERAPY: Systemic chemotherapy with carboplatin for AUC of 5, paclitaxel 175 Mg/M2 and Keytruda 200 Mg IV every 3 weeks with Neulasta support. First dose September 06, 2023.   INTERVAL HISTORY: Rebecca Ochoa 62 y.o. female returns to the clinic today for a follow up visit. The patient was last seen in the clinic by Dr. Arbutus Ped on 08/24/23. Th patient completed palliative radiation to ***under the care of Dr. Roselind Messier on 09/04/23. She is expected to start systemic chemotherapy and immunotherapy today. She had her chemoeducation class and does not have any questions. She had a port-a-cath placed as well.   Denies any fever, chills, night sweats, or weight loss. Denies any chest pain, shortness of breath, or hemoptysis. She has been having a persistent cough and Dr. Arbutus Ped previously prescribed hycodan which ***. Denies any nausea, vomiting, diarrhea, or constipation. Denies any headache or visual changes. Denies any rashes or skin changes. The patient is here today for evaluation prior to starting cycle # 1   MEDICAL HISTORY: Past Medical History:  Diagnosis Date   Anemia    Asthma, mild intermittent, well-controlled    GERD (gastroesophageal reflux disease)    Hx of migraines    Hyperlipidemia    Hypertension    Vitamin D deficiency      ALLERGIES:  is allergic to biaxin [clarithromycin].  MEDICATIONS:  Current Outpatient Medications  Medication Sig Dispense Refill   acetaminophen (TYLENOL) 500 MG tablet Take 1,000 mg by mouth every 8 (eight) hours as needed for mild pain.     albuterol (VENTOLIN HFA) 108 (90 Base) MCG/ACT inhaler Inhale 2 puffs into the lungs every 6 (six) hours as needed for wheezing or shortness of breath. 8 g 2   ALPRAZolam (XANAX) 0.5 MG tablet Take 1 tablet (0.5 mg total) by mouth 2 (two) times daily as needed for anxiety. (Patient taking differently: Take 0.5 mg by mouth See admin instructions. Take 0.5 mg by mouth at bedtime and an additional 0.5 mg once a day as needed for anxiety) 60 tablet 0   benzonatate (TESSALON) 100 MG capsule Take 1 capsule (100 mg total) by mouth 3 (three) times daily. 20 capsule 0   cyanocobalamin 1000 MCG tablet Take 1 tablet (1,000 mcg total) by mouth daily. 30 tablet 0   dextromethorphan-guaiFENesin (MUCINEX DM) 30-600 MG 12hr tablet Take 1 tablet by mouth 2 (two) times daily. 60 tablet 0   HYDROcodone bit-homatropine (HYCODAN) 5-1.5 MG/5ML syrup Take 5 mLs by mouth every 4 (four) hours as needed for cough. 120 mL 0   HYDROcodone-acetaminophen (NORCO/VICODIN) 5-325 MG tablet Take 1 tablet by mouth every 6 (six) hours as needed. (Patient not taking: Reported on 08/14/2023) 10 tablet 0   lidocaine (LIDODERM) 5 % Place 1 patch onto the skin every 12 (twelve) hours. Remove & Discard patch within 12 hours or as directed by MD (Patient not taking: Reported on 08/14/2023) 10  patch 0   [START ON 09/05/2023] lidocaine-prilocaine (EMLA) cream Apply to affected area once 30 g 3   meloxicam (MOBIC) 15 MG tablet Take 1/2 to 1 tablet  Daily  with Food  for Pain & Inflammation (Patient taking differently: Take 7.5-15 mg by mouth See admin instructions. Take 7.5-15 mg by mouth once a day for pain and inflammation) 90 tablet 3   ondansetron (ZOFRAN) 4 MG tablet Take 1 tablet (4 mg total) by  mouth every 6 (six) hours as needed for nausea. 20 tablet 0   [START ON 09/05/2023] ondansetron (ZOFRAN) 8 MG tablet Take 1 tablet (8 mg total) by mouth every 8 (eight) hours as needed for nausea or vomiting. Start on the third day after carboplatin. 30 tablet 1   predniSONE (DELTASONE) 10 MG tablet 3 tablet p.o. daily for 1 week followed by 2 tablet p.o. daily for 1 week followed by 1 tablet p.o. daily for 1 week 42 tablet 0   [START ON 09/05/2023] prochlorperazine (COMPAZINE) 10 MG tablet Take 1 tablet (10 mg total) by mouth every 6 (six) hours as needed for nausea or vomiting. 30 tablet 1   No current facility-administered medications for this visit.    SURGICAL HISTORY:  Past Surgical History:  Procedure Laterality Date   BREAST SURGERY     reduction   BRONCHIAL BIOPSY  08/08/2023   Procedure: BRONCHIAL BIOPSIES;  Surgeon: Leslye Peer, MD;  Location: [facility] ENDOSCOPY;  Service: Pulmonary;;   BRONCHIAL BRUSHINGS  08/08/2023   Procedure: BRONCHIAL BRUSHINGS;  Surgeon: Leslye Peer, MD;  Location: [facility] ENDOSCOPY;  Service: Pulmonary;;   BRONCHIAL NEEDLE ASPIRATION BIOPSY  08/08/2023   Procedure: BRONCHIAL NEEDLE ASPIRATION BIOPSIES;  Surgeon: Leslye Peer, MD;  Location: [facility] ENDOSCOPY;  Service: Pulmonary;;   ENDOBRONCHIAL ULTRASOUND Bilateral 08/08/2023   Procedure: ENDOBRONCHIAL ULTRASOUND;  Surgeon: Leslye Peer, MD;  Location: [facility] ENDOSCOPY;  Service: Pulmonary;  Laterality: Bilateral;   HEMOSTASIS CONTROL  08/08/2023   Procedure: HEMOSTASIS CONTROL;  Surgeon: Leslye Peer, MD;  Location: [facility] ENDOSCOPY;  Service: Pulmonary;;   REDUCTION MAMMAPLASTY     TUBAL LIGATION      REVIEW OF SYSTEMS:   Review of Systems  Constitutional: Negative for appetite change, chills, fatigue, fever and unexpected weight change.  HENT:   Negative for mouth sores, nosebleeds, sore throat and trouble swallowing.   Eyes: Negative for eye problems and icterus.  Respiratory: Negative for cough,  hemoptysis, shortness of breath and wheezing.   Cardiovascular: Negative for chest pain and leg swelling.  Gastrointestinal: Negative for abdominal pain, constipation, diarrhea, nausea and vomiting.  Genitourinary: Negative for bladder incontinence, difficulty urinating, dysuria, frequency and hematuria.   Musculoskeletal: Negative for back pain, gait problem, neck pain and neck stiffness.  Skin: Negative for itching and rash.  Neurological: Negative for dizziness, extremity weakness, gait problem, headaches, light-headedness and seizures.  Hematological: Negative for adenopathy. Does not bruise/bleed easily.  Psychiatric/Behavioral: Negative for confusion, depression and sleep disturbance. The patient is not nervous/anxious.     PHYSICAL EXAMINATION:  There were no vitals taken for this visit.  ECOG PERFORMANCE STATUS: {CHL ONC ECOG [id number]  Physical Exam  Constitutional: Oriented to person, place, and time and well-developed, well-nourished, and in no distress. No distress.  HENT:  Head: Normocephalic and atraumatic.  Mouth/Throat: Oropharynx is clear and moist. No oropharyngeal exudate.  Eyes: Conjunctivae are normal. Right eye exhibits no discharge. Left eye exhibits no discharge. No scleral icterus.  Neck: Normal range of motion.  Neck supple.  Cardiovascular: Normal rate, regular rhythm, normal heart sounds and intact distal pulses.   Pulmonary/Chest: Effort normal and breath sounds normal. No respiratory distress. No wheezes. No rales.  Abdominal: Soft. Bowel sounds are normal. Exhibits no distension and no mass. There is no tenderness.  Musculoskeletal: Normal range of motion. Exhibits no edema.  Lymphadenopathy:    No cervical adenopathy.  Neurological: Alert and oriented to person, place, and time. Exhibits normal muscle tone. Gait normal. Coordination normal.  Skin: Skin is warm and dry. No rash noted. Not diaphoretic. No erythema. No pallor.  Psychiatric: Mood, memory  and judgment normal.  Vitals reviewed.  LABORATORY DATA: Lab Results  Component Value Date   WBC 9.6 08/24/2023   HGB 11.1 (L) 08/24/2023   HCT 33.8 (L) 08/24/2023   MCV 93.1 08/24/2023   PLT 316 08/24/2023      Chemistry      Component Value Date/Time   NA 141 08/24/2023 0819   K 3.3 (L) 08/24/2023 0819   CL 104 08/24/2023 0819   CO2 31 08/24/2023 0819   BUN 10 08/24/2023 0819   CREATININE 0.75 08/24/2023 0819   CREATININE 1.05 10/12/2022 1442      Component Value Date/Time   CALCIUM 8.9 08/24/2023 0819   ALKPHOS 101 08/24/2023 0819   AST 39 08/24/2023 0819   ALT 50 (H) 08/24/2023 0819   BILITOT 0.6 08/24/2023 0819       RADIOGRAPHIC STUDIES:  MR BRAIN W WO CONTRAST  Result Date: 08/16/2023 CLINICAL DATA:  Non-small cell lung cancer (NSCLC), staging EXAM: MRI HEAD WITHOUT AND WITH CONTRAST TECHNIQUE: Multiplanar, multiecho pulse sequences of the brain and surrounding structures were obtained without and with intravenous contrast. CONTRAST:  8mL GADAVIST GADOBUTROL 1 MMOL/ML IV SOLN COMPARISON:  None Available. FINDINGS: Brain: No acute infarction, hemorrhage, hydrocephalus, extra-axial collection or mass lesion. No pathologic enhancement. Vascular: No pathologic enhancement. No Skull and upper cervical spine: Normal marrow signal. Sinuses/Orbits: Clear sinuses.  No acute orbital findings. Other: No mastoid effusions. IMPRESSION: No evidence of acute intracranial abnormality or metastatic disease. Electronically Signed   By: Feliberto Harts M.D.   On: 08/16/2023 09:26   NM PET Image Initial (PI) Skull Base To Thigh (F-18 FDG)  Result Date: 08/14/2023 CLINICAL DATA:  Initial treatment strategy for non-small cell lung cancer. EXAM: NUCLEAR MEDICINE PET SKULL BASE TO THIGH TECHNIQUE: 8.90 mCi F-18 FDG was injected intravenously. Full-ring PET imaging was performed from the skull base to thigh after the radiotracer. CT data was obtained and used for attenuation correction  and anatomic localization. Fasting blood glucose: 118 mg/dl COMPARISON:  Chest CT 65/78/4696 FINDINGS: Mediastinal blood pool activity: SUV max 2.51 Liver activity: SUV max NA NECK: No hypermetabolic lymph nodes in the neck. Moderate symmetric hypermetabolism in the region of Waldeyer's ring likely inflammatory lymphoid tissue. Incidental CT findings: None. CHEST: 5.5 cm right upper lobe lung mass is hypermetabolic with SUV max of 14.42. Adjacent large necrotic right hilar/mediastinal mass has an SUV max of 1214. Innumerable hypermetabolic pulmonary metastatic lesions. 17 mm right lower lobe lesion has an SUV max of 10.35. 13 mm right middle lobe lesion has an SUV max of 12.07. 14 mm left upper lobe nodule has an SUV max of 10.31. No hypermetabolic breast masses, supraclavicular or axillary adenopathy. Incidental CT findings: Stable age advanced atherosclerotic calcification involving the aorta and coronary arteries. ABDOMEN/PELVIS: Hepatic metastatic disease. 15 mm segment 7 lesion has an SUV max of 7.23. Partially necrotic 7 cm mass  and segment 2 has an SUV max of 12.9 E. partially necrotic mass in segment 3 has an SUV max of 12.75. 14 mm celiac axis node has an SUV max 10.76 No adrenal gland metastasis.  Abdominal lymphadenopathy. Incidental CT findings: Age advanced atherosclerotic calcification involving the aorta and iliac arteries. No aneurysm. Moderate sigmoid colon diverticulosis. SKELETON: Hypermetabolic focus in the lower right scapula has an SUV max of 3.98. No obvious CT abnormality but this is certainly suspicious for an osseous metastasis. Small hypermetabolic focus in the L4 vertebral body has an SUV max of 4.06 and is also suspicious. I do not see any other definite lesions. Incidental CT findings: None. IMPRESSION: 1. 5.5 cm right upper lobe lung mass and large necrotic right hilar/mediastinal mass are hypermetabolic and consistent with known neoplasm. 2. Innumerable hypermetabolic pulmonary  metastatic lesions. 3. Hepatic metastatic disease. 4. Hypermetabolic celiac axis lymph node. 5. Two small hypermetabolic bone lesions in the right scapula and L4 vertebral body are suspicious for osseous metastatic disease. Aortic Atherosclerosis (ICD10-I70.0). Electronically Signed   By: Rudie Meyer M.D.   On: 08/14/2023 14:49   CT Angio Chest PE W/Cm &/Or Wo Cm  Result Date: 08/14/2023 CLINICAL DATA:  Pulmonary embolism (PE) suspected. History of lung cancer. EXAM: CT ANGIOGRAPHY CHEST WITH CONTRAST TECHNIQUE: Multidetector CT imaging of the chest was performed using the standard protocol during bolus administration of intravenous contrast. Multiplanar CT image reconstructions and MIPs were obtained to evaluate the vascular anatomy. RADIATION DOSE REDUCTION: This exam was performed according to the departmental dose-optimization program which includes automated exposure control, adjustment of the mA and/or kV according to patient size and/or use of iterative reconstruction technique. CONTRAST:  75mL OMNIPAQUE IOHEXOL 350 MG/ML SOLN COMPARISON:  CT chest dated August 08, 2023. FINDINGS: Cardiovascular: Satisfactory opacification of the pulmonary arteries to the segmental level. No evidence of pulmonary embolism. There is narrowing of the right main and interlobar pulmonary arteries and compression of the right upper and middle lobe segmental arterial branches secondary to the large necrotic right hilar nodal mass, described below. Normal heart size. Multivessel coronary artery calcifications. Atherosclerotic calcification of the thoracic aorta. No pericardial effusion. Mediastinum/Nodes: Redemonstration of a large necrotic nodal mass involving the right hilum and invading the mediastinum, similar to the prior exam. The esophagus is grossly unremarkable. Lungs/Pleura: Essentially stable large right upper lobe lung mass measuring approximately 5.6 x 4.7 cm, variations in measurement may be attributable to  differences in slice selection. Innumerable bilateral pulmonary metastatic lesions are again noted. There are new patchy ground-glass and consolidative changes in the right mid lung (series 13, image 53). There is similar compression of the bronchus intermedius secondary to the large necrotic nodal mass of the right hilum. No pneumothorax. Upper Abdomen: Unchanged large left hepatic heterogenous hypodense lesion measuring up to 7 cm. Otherwise, no acute findings within the visualized upper abdomen. Musculoskeletal: No suspicious osseous lesion. Review of the MIP images confirms the above findings. IMPRESSION: 1. No evidence of acute pulmonary embolism. 2. New patchy ground-glass and consolidative changes in the right mid lung may relate to a postobstructive infectious/inflammatory etiology secondary to the known large right hilar nodal mass. 3. Otherwise, no significant change compared to the prior examination dated August 08, 2023 with redemonstration of a dominant right upper lobe lung mass and large necrotic right hilar nodal mass invading the mediastinum. There is associated narrowing of the right main and interlobar pulmonary arteries and compression of the right upper and middle lobe segmental arterial  branches. 4. Innumerable bilateral pulmonary metastatic lesions. 5. Unchanged large left hepatic lobe lesion, concerning for metastatic disease. 6.  Aortic Atherosclerosis (ICD10-I70.0). Electronically Signed   By: Hart Robinsons M.D.   On: 08/14/2023 14:48   DG Chest Port 1 View  Result Date: 08/14/2023 CLINICAL DATA:  History of lung cancer with shortness of breath EXAM: PORTABLE CHEST 1 VIEW COMPARISON:  X-ray 07/21/2019, CT 08/08/2023 FINDINGS: Underinflation with the elevated right hemidiaphragm. Multiple bilateral mass lesions are again seen, right-greater-than-left. Several are increased compared to the prior x-ray. Please correlate with more recent CT examination. No pneumothorax or effusion.  Basilar atelectasis. Normal cardiopericardial silhouette. Calcified aorta. IMPRESSION: Multiple bilateral lung masses, increased compared to the prior x-ray. There also new elevation of the right hemidiaphragm. Please correlate with the more recent CT examinations. Electronically Signed   By: Karen Kays M.D.   On: 08/14/2023 10:41   CT Super D Chest Wo Contrast  Result Date: 08/08/2023 CLINICAL DATA:  Non-small cell lung cancer.  Pre EMB and biopsy. EXAM: CT CHEST WITHOUT CONTRAST TECHNIQUE: Multidetector CT imaging of the chest was performed using thin slice collimation for electromagnetic bronchoscopy planning purposes, without intravenous contrast. RADIATION DOSE REDUCTION: This exam was performed according to the departmental dose-optimization program which includes automated exposure control, adjustment of the mA and/or kV according to patient size and/or use of iterative reconstruction technique. COMPARISON:  Chest CT 07/21/2023 FINDINGS: Cardiovascular: The heart is normal in size. No pericardial effusion. Stable age advanced aortic and coronary artery calcifications. Calcifications also noted around the aortic valve. Mediastinum/Nodes: Stable large necrotic mass of adenopathy involving the right hilum and invading the mediastinum. This is compressing the bronchus intermedius. The esophagus is grossly normal. Lungs/Pleura: Stable large right upper lobe lung mass measuring approximately 5.8 x 5.4 cm. Enlarging innumerable pulmonary metastatic lesions bilaterally. Upper Abdomen: Large left hepatic lobe lesion measuring approximately 7 cm. No adrenal gland masses. No upper abdominal adenopathy. Stable age advanced aortic calcifications. Suspect cholelithiasis. Musculoskeletal: No findings for osseous metastatic disease. IMPRESSION: 1. Stable large right upper lobe lung mass with associated large necrotic mass of adenopathy involving the right hilum and invading the mediastinum. 2. Enlarging innumerable  pulmonary metastatic lesions bilaterally. 3. Large left hepatic lobe lesion. Aortic Atherosclerosis (ICD10-I70.0). Electronically Signed   By: Rudie Meyer M.D.   On: 08/08/2023 10:32   DG C-Arm 1-60 Min-No Report  Result Date: 08/08/2023 Fluoroscopy was utilized by the requesting physician.  No radiographic interpretation.     ASSESSMENT/PLAN:  This is a very pleasant 62 year old Caucasian female with stage IV (T3, N2, M1 C) non-small cell lung cancer, poorly differentiated squamous cell carcinoma.  She presented with a large right upper lobe mass in addition to large necrotic right hilar/mediastinal mass and innumerable hypermetabolic pulmonary metastatic lesions in addition to liver and bone metastases as well as abdominal celiac axis lymphadenopathy.  She was diagnosed in October 2024.  Her PD-L1 expression is 0%.  The patient completed palliative radiation under the care of Dr. Roselind Messier which was completed on 1//24.  She is scheduled to start chemotherapy and immunotherapy today with carboplatin for an AUC of 5, paclitaxel 175 mg/m, Keytruda 20 mg IV every 3 weeks with Neulasta support.  The patient had her chemo education class.  She does not have any questions.  Labs were reviewed.  Recommend that she***with cycle #1 today as scheduled.  We will see her back for follow-up visit in 1 week for evaluation and 1 week follow-up visit to  manage any adverse side effects of treatment.  Hycodan for cough?  The patient was advised to call immediately if she has any concerning symptoms in the interval. The patient voices understanding of current disease status and treatment options and is in agreement with the current care plan. All questions were answered. The patient knows to call the clinic with any problems, questions or concerns. We can certainly see the patient much sooner if necessary      No orders of the defined types were placed in this encounter.    I spent {CHL ONC TIME VISIT  - OZHYQ:[id number]} counseling the patient face to face. The total time spent in the appointment was {CHL ONC TIME VISIT - BMWUX:[id number]}.  Deniz Hannan L Karron Alvizo, PA-C 08/31/23

## 2023-09-01 ENCOUNTER — Telehealth: Payer: Self-pay | Admitting: *Deleted

## 2023-09-01 ENCOUNTER — Ambulatory Visit (HOSPITAL_COMMUNITY)
Admission: RE | Admit: 2023-09-01 | Discharge: 2023-09-01 | Disposition: A | Discharge status: Home / Self Care | Payer: No Typology Code available for payment source | Source: Ambulatory Visit | Attending: Internal Medicine | Admitting: Internal Medicine

## 2023-09-01 ENCOUNTER — Other Ambulatory Visit: Payer: Self-pay | Admitting: Internal Medicine

## 2023-09-01 ENCOUNTER — Other Ambulatory Visit: Payer: Self-pay

## 2023-09-01 ENCOUNTER — Encounter (HOSPITAL_COMMUNITY): Payer: Self-pay

## 2023-09-01 ENCOUNTER — Ambulatory Visit
Admission: RE | Admit: 2023-09-01 | Discharge: 2023-09-01 | Disposition: A | Discharge status: Home / Self Care | Payer: No Typology Code available for payment source | Source: Ambulatory Visit | Attending: Radiation Oncology | Admitting: Radiation Oncology

## 2023-09-01 DIAGNOSIS — E785 Hyperlipidemia, unspecified: Secondary | ICD-10-CM | POA: Insufficient documentation

## 2023-09-01 DIAGNOSIS — C3411 Malignant neoplasm of upper lobe, right bronchus or lung: Secondary | ICD-10-CM | POA: Insufficient documentation

## 2023-09-01 DIAGNOSIS — K219 Gastro-esophageal reflux disease without esophagitis: Secondary | ICD-10-CM | POA: Insufficient documentation

## 2023-09-01 DIAGNOSIS — F17290 Nicotine dependence, other tobacco product, uncomplicated: Secondary | ICD-10-CM | POA: Insufficient documentation

## 2023-09-01 DIAGNOSIS — I10 Essential (primary) hypertension: Secondary | ICD-10-CM | POA: Insufficient documentation

## 2023-09-01 DIAGNOSIS — G43909 Migraine, unspecified, not intractable, without status migrainosus: Secondary | ICD-10-CM | POA: Insufficient documentation

## 2023-09-01 DIAGNOSIS — J4520 Mild intermittent asthma, uncomplicated: Secondary | ICD-10-CM | POA: Insufficient documentation

## 2023-09-01 DIAGNOSIS — C787 Secondary malignant neoplasm of liver and intrahepatic bile duct: Secondary | ICD-10-CM | POA: Insufficient documentation

## 2023-09-01 DIAGNOSIS — C3490 Malignant neoplasm of unspecified part of unspecified bronchus or lung: Secondary | ICD-10-CM

## 2023-09-01 DIAGNOSIS — I2699 Other pulmonary embolism without acute cor pulmonale: Secondary | ICD-10-CM | POA: Diagnosis not present

## 2023-09-01 DIAGNOSIS — C7951 Secondary malignant neoplasm of bone: Secondary | ICD-10-CM | POA: Insufficient documentation

## 2023-09-01 HISTORY — PX: IR IMAGING GUIDED PORT INSERTION: IMG5740

## 2023-09-01 LAB — RAD ONC ARIA SESSION SUMMARY
Course Elapsed Days: 16
Plan Fractions Treated to Date: 13
Plan Prescribed Dose Per Fraction: 2.5 Gy
Plan Total Fractions Prescribed: 14
Plan Total Prescribed Dose: 35 Gy
Reference Point Dosage Given to Date: 32.5 Gy
Reference Point Session Dosage Given: 2.5 Gy
Session Number: 13

## 2023-09-01 MED ORDER — HEPARIN SOD (PORK) LOCK FLUSH 100 UNIT/ML IV SOLN
500.0000 [IU] | Freq: Once | INTRAVENOUS | Status: AC
  Administered 2023-09-01: 500 [IU] via INTRAVENOUS

## 2023-09-01 MED ORDER — HEPARIN SOD (PORK) LOCK FLUSH 100 UNIT/ML IV SOLN
INTRAVENOUS | Status: AC
  Filled 2023-09-01: qty 5

## 2023-09-01 MED ORDER — LIDOCAINE-EPINEPHRINE 1 %-1:100000 IJ SOLN
INTRAMUSCULAR | Status: AC
  Filled 2023-09-01: qty 1

## 2023-09-01 MED ORDER — MIDAZOLAM HCL 2 MG/2ML IJ SOLN
INTRAMUSCULAR | Status: AC | PRN
Start: 2023-09-01 — End: 2023-09-01
  Administered 2023-09-01: 1 mg via INTRAVENOUS
  Administered 2023-09-01 (×2): .5 mg via INTRAVENOUS

## 2023-09-01 MED ORDER — FENTANYL CITRATE (PF) 100 MCG/2ML IJ SOLN
INTRAMUSCULAR | Status: AC
  Filled 2023-09-01: qty 2

## 2023-09-01 MED ORDER — MIDAZOLAM HCL 2 MG/2ML IJ SOLN
INTRAMUSCULAR | Status: AC
  Filled 2023-09-01: qty 2

## 2023-09-01 MED ORDER — SODIUM CHLORIDE 0.9 % IV SOLN: INTRAVENOUS | Status: DC

## 2023-09-01 MED ORDER — FENTANYL CITRATE (PF) 100 MCG/2ML IJ SOLN
INTRAMUSCULAR | Status: AC | PRN
  Administered 2023-09-01: 50 ug via INTRAVENOUS
  Administered 2023-09-01 (×2): 25 ug via INTRAVENOUS

## 2023-09-01 MED ORDER — LIDOCAINE HCL 1 % IJ SOLN
20.0000 mL | Freq: Once | INTRAMUSCULAR | Status: AC
  Administered 2023-09-01: 20 mL via INTRADERMAL

## 2023-09-01 NOTE — Telephone Encounter (Signed)
08/29/2023 FMLA form completed to [facility] pick up bin for provider review.  Today received signed form.  Returned via fax to [employer]. [place] [phone]).  Copy to [facility] scanning bin.  Copy to [facility] file folder for patient pick up on 09/05/2023.  No further instructions received or actions performed by this nurse.

## 2023-09-01 NOTE — Procedures (Signed)
Vascular and Interventional Radiology Procedure Note  Patient: Rebecca Ochoa DOB: [birth year] Medical Record Number: [id number] Note Date/Time: 09/01/23 9:31 AM   Performing Physician: Roanna Banning, MD Assistant(s): None  Diagnosis: Lung cancer. RIGHT  Procedure: PORT PLACEMENT  Anesthesia: Conscious Sedation Complications: None Estimated Blood Loss: Minimal  Findings:  Successful left-sided port placement, with the tip of the catheter in the proximal right atrium.  Plan: Catheter ready for use.  See detailed procedure note with images in PACS. The patient tolerated the procedure well without incident or complication and was returned to Recovery in stable condition.    Roanna Banning, MD [facility] Radiology   Pager. [phone] Clinic. [phone]

## 2023-09-03 ENCOUNTER — Other Ambulatory Visit: Payer: Self-pay

## 2023-09-04 ENCOUNTER — Emergency Department (HOSPITAL_COMMUNITY): Payer: No Typology Code available for payment source

## 2023-09-04 ENCOUNTER — Other Ambulatory Visit: Payer: Self-pay

## 2023-09-04 ENCOUNTER — Encounter (HOSPITAL_COMMUNITY): Payer: Self-pay

## 2023-09-04 ENCOUNTER — Ambulatory Visit
Admission: RE | Admit: 2023-09-04 | Discharge: 2023-09-04 | Disposition: A | Discharge status: Home / Self Care | Payer: No Typology Code available for payment source | Source: Ambulatory Visit | Attending: Radiation Oncology | Admitting: Radiation Oncology

## 2023-09-04 ENCOUNTER — Inpatient Hospital Stay (HOSPITAL_COMMUNITY)
Admission: EM | Admit: 2023-09-04 | Discharge: 2023-09-07 | DRG: 166 | Disposition: A | Discharge status: Home / Self Care | Payer: No Typology Code available for payment source | Source: Ambulatory Visit | Attending: Family Medicine | Admitting: Family Medicine

## 2023-09-04 DIAGNOSIS — G43909 Migraine, unspecified, not intractable, without status migrainosus: Secondary | ICD-10-CM | POA: Diagnosis present

## 2023-09-04 DIAGNOSIS — Z8701 Personal history of pneumonia (recurrent): Secondary | ICD-10-CM

## 2023-09-04 DIAGNOSIS — Z7901 Long term (current) use of anticoagulants: Secondary | ICD-10-CM

## 2023-09-04 DIAGNOSIS — R0781 Pleurodynia: Secondary | ICD-10-CM | POA: Diagnosis present

## 2023-09-04 DIAGNOSIS — D696 Thrombocytopenia, unspecified: Secondary | ICD-10-CM | POA: Diagnosis present

## 2023-09-04 DIAGNOSIS — I2695 Cement embolism of pulmonary artery without acute cor pulmonale: Secondary | ICD-10-CM | POA: Diagnosis not present

## 2023-09-04 DIAGNOSIS — I2699 Other pulmonary embolism without acute cor pulmonale: Principal | ICD-10-CM | POA: Diagnosis present

## 2023-09-04 DIAGNOSIS — R000 Tachycardia, unspecified: Secondary | ICD-10-CM | POA: Diagnosis present

## 2023-09-04 DIAGNOSIS — Z791 Long term (current) use of non-steroidal anti-inflammatories (NSAID): Secondary | ICD-10-CM

## 2023-09-04 DIAGNOSIS — I82442 Acute embolism and thrombosis of left tibial vein: Secondary | ICD-10-CM | POA: Diagnosis present

## 2023-09-04 DIAGNOSIS — J9859 Other diseases of mediastinum, not elsewhere classified: Secondary | ICD-10-CM | POA: Diagnosis present

## 2023-09-04 DIAGNOSIS — Z9221 Personal history of antineoplastic chemotherapy: Secondary | ICD-10-CM

## 2023-09-04 DIAGNOSIS — I82432 Acute embolism and thrombosis of left popliteal vein: Secondary | ICD-10-CM | POA: Diagnosis present

## 2023-09-04 DIAGNOSIS — Z8616 Personal history of COVID-19: Secondary | ICD-10-CM

## 2023-09-04 DIAGNOSIS — Z841 Family history of disorders of kidney and ureter: Secondary | ICD-10-CM

## 2023-09-04 DIAGNOSIS — Z1883 Retained stone or crystalline fragments: Secondary | ICD-10-CM

## 2023-09-04 DIAGNOSIS — R040 Epistaxis: Secondary | ICD-10-CM | POA: Diagnosis not present

## 2023-09-04 DIAGNOSIS — Z1152 Encounter for screening for COVID-19: Secondary | ICD-10-CM | POA: Diagnosis not present

## 2023-09-04 DIAGNOSIS — T8481XA Embolism due to internal orthopedic prosthetic devices, implants and grafts, initial encounter: Secondary | ICD-10-CM | POA: Diagnosis not present

## 2023-09-04 DIAGNOSIS — D630 Anemia in neoplastic disease: Secondary | ICD-10-CM | POA: Diagnosis present

## 2023-09-04 DIAGNOSIS — J4520 Mild intermittent asthma, uncomplicated: Secondary | ICD-10-CM | POA: Diagnosis present

## 2023-09-04 DIAGNOSIS — Z8249 Family history of ischemic heart disease and other diseases of the circulatory system: Secondary | ICD-10-CM

## 2023-09-04 DIAGNOSIS — Z79899 Other long term (current) drug therapy: Secondary | ICD-10-CM

## 2023-09-04 DIAGNOSIS — Z87891 Personal history of nicotine dependence: Secondary | ICD-10-CM

## 2023-09-04 DIAGNOSIS — Z9851 Tubal ligation status: Secondary | ICD-10-CM

## 2023-09-04 DIAGNOSIS — J850 Gangrene and necrosis of lung: Secondary | ICD-10-CM | POA: Diagnosis present

## 2023-09-04 DIAGNOSIS — D849 Immunodeficiency, unspecified: Secondary | ICD-10-CM | POA: Diagnosis present

## 2023-09-04 DIAGNOSIS — C3411 Malignant neoplasm of upper lobe, right bronchus or lung: Secondary | ICD-10-CM | POA: Diagnosis present

## 2023-09-04 DIAGNOSIS — I82452 Acute embolism and thrombosis of left peroneal vein: Secondary | ICD-10-CM | POA: Diagnosis present

## 2023-09-04 DIAGNOSIS — R0602 Shortness of breath: Secondary | ICD-10-CM | POA: Diagnosis not present

## 2023-09-04 DIAGNOSIS — I10 Essential (primary) hypertension: Secondary | ICD-10-CM | POA: Diagnosis present

## 2023-09-04 DIAGNOSIS — Z8619 Personal history of other infectious and parasitic diseases: Secondary | ICD-10-CM

## 2023-09-04 DIAGNOSIS — C787 Secondary malignant neoplasm of liver and intrahepatic bile duct: Secondary | ICD-10-CM | POA: Diagnosis present

## 2023-09-04 DIAGNOSIS — K2100 Gastro-esophageal reflux disease with esophagitis, without bleeding: Secondary | ICD-10-CM | POA: Diagnosis present

## 2023-09-04 DIAGNOSIS — R590 Localized enlarged lymph nodes: Secondary | ICD-10-CM | POA: Diagnosis present

## 2023-09-04 DIAGNOSIS — F419 Anxiety disorder, unspecified: Secondary | ICD-10-CM | POA: Diagnosis present

## 2023-09-04 DIAGNOSIS — E785 Hyperlipidemia, unspecified: Secondary | ICD-10-CM | POA: Diagnosis present

## 2023-09-04 DIAGNOSIS — C7951 Secondary malignant neoplasm of bone: Secondary | ICD-10-CM | POA: Diagnosis present

## 2023-09-04 DIAGNOSIS — Z833 Family history of diabetes mellitus: Secondary | ICD-10-CM

## 2023-09-04 DIAGNOSIS — R609 Edema, unspecified: Secondary | ICD-10-CM | POA: Diagnosis not present

## 2023-09-04 DIAGNOSIS — Z9889 Other specified postprocedural states: Secondary | ICD-10-CM

## 2023-09-04 DIAGNOSIS — Z923 Personal history of irradiation: Secondary | ICD-10-CM

## 2023-09-04 DIAGNOSIS — Z881 Allergy status to other antibiotic agents status: Secondary | ICD-10-CM

## 2023-09-04 LAB — COMPREHENSIVE METABOLIC PANEL
ALT: 30 U/L (ref 0–44)
AST: 38 U/L (ref 15–41)
Albumin: 3.3 g/dL — ABNORMAL LOW (ref 3.5–5.0)
Alkaline Phosphatase: 126 U/L (ref 38–126)
Anion gap: 13 (ref 5–15)
BUN: 12 mg/dL (ref 8–23)
CO2: 21 mmol/L — ABNORMAL LOW (ref 22–32)
Calcium: 9.2 mg/dL (ref 8.9–10.3)
Chloride: 104 mmol/L (ref 98–111)
Creatinine, Ser: 0.68 mg/dL (ref 0.44–1.00)
GFR, Estimated: >60 mL/min (ref >60)
Glucose, Bld: 134 mg/dL — ABNORMAL HIGH (ref 70–99)
Potassium: 4.1 mmol/L (ref 3.5–5.1)
Sodium: 138 mmol/L (ref 135–145)
Total Bilirubin: 1.1 mg/dL (ref <1.2)
Total Protein: 7.6 g/dL (ref 6.5–8.1)

## 2023-09-04 LAB — RAD ONC ARIA SESSION SUMMARY
Course Elapsed Days: 19
Plan Fractions Treated to Date: 14
Plan Prescribed Dose Per Fraction: 2.5 Gy
Plan Total Fractions Prescribed: 14
Plan Total Prescribed Dose: 35 Gy
Reference Point Dosage Given to Date: 35 Gy
Reference Point Session Dosage Given: 2.5 Gy
Session Number: 14

## 2023-09-04 LAB — URINALYSIS, W/ REFLEX TO CULTURE (INFECTION SUSPECTED)
Bilirubin Urine: NEGATIVE
Glucose, UA: NEGATIVE mg/dL
Ketones, ur: 5 mg/dL — AB
Nitrite: NEGATIVE
Protein, ur: NEGATIVE mg/dL
Specific Gravity, Urine: 1.024 (ref 1.005–1.030)
pH: 5 (ref 5.0–8.0)

## 2023-09-04 LAB — CBC WITH DIFFERENTIAL/PLATELET
Abs Immature Granulocytes: 0.07 10*3/uL (ref 0.00–0.07)
Basophils Absolute: 0 10*3/uL (ref 0.0–0.1)
Basophils Relative: 0 %
Eosinophils Absolute: 0 10*3/uL (ref 0.0–0.5)
Eosinophils Relative: 0 %
HCT: 35.8 % — ABNORMAL LOW (ref 36.0–46.0)
Hemoglobin: 11.4 g/dL — ABNORMAL LOW (ref 12.0–15.0)
Immature Granulocytes: 1 %
Lymphocytes Relative: 2 %
Lymphs Abs: 0.3 10*3/uL — ABNORMAL LOW (ref 0.7–4.0)
MCH: 29.5 pg (ref 26.0–34.0)
MCHC: 31.8 g/dL (ref 30.0–36.0)
MCV: 92.5 fL (ref 80.0–100.0)
Monocytes Absolute: 0.5 10*3/uL (ref 0.1–1.0)
Monocytes Relative: 4 %
Neutro Abs: 11.9 10*3/uL — ABNORMAL HIGH (ref 1.7–7.7)
Neutrophils Relative %: 93 %
Platelets: 168 10*3/uL (ref 150–400)
RBC: 3.87 MIL/uL (ref 3.87–5.11)
RDW: 14.4 % (ref 11.5–15.5)
WBC: 12.7 10*3/uL — ABNORMAL HIGH (ref 4.0–10.5)
nRBC: 0 % (ref 0.0–0.2)

## 2023-09-04 LAB — TROPONIN I (HIGH SENSITIVITY)
Troponin I (High Sensitivity): 4 ng/L (ref <18)
Troponin I (High Sensitivity): 3 ng/L (ref <18)

## 2023-09-04 LAB — BRAIN NATRIURETIC PEPTIDE: B Natriuretic Peptide: 34.3 pg/mL (ref 0.0–100.0)

## 2023-09-04 LAB — SARS CORONAVIRUS 2 BY RT PCR: SARS Coronavirus 2 by RT PCR: NEGATIVE

## 2023-09-04 LAB — I-STAT CG4 LACTIC ACID, ED: Lactic Acid, Venous: 1.6 mmol/L (ref 0.5–1.9)

## 2023-09-04 LAB — PROTIME-INR
INR: 1.1 (ref 0.8–1.2)
Prothrombin Time: 14.9 s (ref 11.4–15.2)

## 2023-09-04 LAB — APTT: aPTT: 29 s (ref 24–36)

## 2023-09-04 MED ORDER — PREDNISONE 20 MG PO TABS
20.0000 mg | ORAL_TABLET | Freq: Every day | ORAL | Status: AC
  Administered 2023-09-05 – 2023-09-06 (×2): 20 mg via ORAL
  Filled 2023-09-04 (×2): qty 1

## 2023-09-04 MED ORDER — IPRATROPIUM-ALBUTEROL 0.5-2.5 (3) MG/3ML IN SOLN: 3.0000 mL | RESPIRATORY_TRACT | Status: DC | PRN

## 2023-09-04 MED ORDER — VITAMIN B-12 1000 MCG PO TABS
1000.0000 ug | ORAL_TABLET | Freq: Every day | ORAL | Status: DC
  Administered 2023-09-05: 1000 ug via ORAL
  Filled 2023-09-04 (×3): qty 1

## 2023-09-04 MED ORDER — IOHEXOL 350 MG/ML SOLN
80.0000 mL | Freq: Once | INTRAVENOUS | Status: AC | PRN
  Administered 2023-09-04: 80 mL via INTRAVENOUS

## 2023-09-04 MED ORDER — ACETAMINOPHEN 650 MG RE SUPP: 650.0000 mg | Freq: Four times a day (QID) | RECTAL | Status: DC | PRN

## 2023-09-04 MED ORDER — HEPARIN BOLUS VIA INFUSION
2000.0000 [IU] | Freq: Once | INTRAVENOUS | Status: AC
  Administered 2023-09-04: 2000 [IU] via INTRAVENOUS
  Filled 2023-09-04: qty 2000

## 2023-09-04 MED ORDER — CHLORHEXIDINE GLUCONATE CLOTH 2 % EX PADS
6.0000 | MEDICATED_PAD | Freq: Every day | CUTANEOUS | Status: DC
  Administered 2023-09-06 – 2023-09-07 (×2): 6 via TOPICAL

## 2023-09-04 MED ORDER — HEPARIN (PORCINE) 25000 UT/250ML-% IV SOLN
1650.0000 [IU]/h | INTRAVENOUS | Status: DC
Start: 2023-09-04 — End: 2023-09-05
  Administered 2023-09-04: 1150 [IU]/h via INTRAVENOUS
  Filled 2023-09-04: qty 250

## 2023-09-04 MED ORDER — ONDANSETRON HCL 4 MG/2ML IJ SOLN
4.0000 mg | Freq: Four times a day (QID) | INTRAMUSCULAR | Status: DC | PRN
  Administered 2023-09-06 – 2023-09-07 (×2): 4 mg via INTRAVENOUS
  Filled 2023-09-04 (×2): qty 2

## 2023-09-04 MED ORDER — ONDANSETRON HCL 4 MG PO TABS: 4.0000 mg | ORAL_TABLET | Freq: Four times a day (QID) | ORAL | Status: DC | PRN

## 2023-09-04 MED ORDER — PREDNISONE 20 MG PO TABS: 20.0000 mg | ORAL_TABLET | Freq: Every day | ORAL | Status: DC

## 2023-09-04 MED ORDER — SENNOSIDES-DOCUSATE SODIUM 8.6-50 MG PO TABS: 1.0000 | ORAL_TABLET | Freq: Every evening | ORAL | Status: DC | PRN

## 2023-09-04 MED ORDER — PREDNISONE 5 MG PO TABS
10.0000 mg | ORAL_TABLET | Freq: Every day | ORAL | Status: DC
  Administered 2023-09-07: 10 mg via ORAL
  Filled 2023-09-04 (×2): qty 2

## 2023-09-04 MED ORDER — PREDNISONE 5 MG PO TABS: 10.0000 mg | ORAL_TABLET | Freq: Every day | ORAL | Status: DC

## 2023-09-04 MED ORDER — ACETAMINOPHEN 325 MG PO TABS: 650.0000 mg | ORAL_TABLET | Freq: Four times a day (QID) | ORAL | Status: DC | PRN

## 2023-09-04 MED ORDER — SODIUM CHLORIDE 0.9 % IV BOLUS
1000.0000 mL | Freq: Once | INTRAVENOUS | Status: AC
  Administered 2023-09-04: 1000 mL via INTRAVENOUS

## 2023-09-04 MED ORDER — ALPRAZOLAM 0.5 MG PO TABS
0.5000 mg | ORAL_TABLET | Freq: Every day | ORAL | Status: DC
  Administered 2023-09-05: 0.5 mg via ORAL
  Filled 2023-09-04: qty 1

## 2023-09-04 NOTE — ED Notes (Signed)
Pt given ham sandwich, saltine crackers x2, and water with ice

## 2023-09-04 NOTE — ED Triage Notes (Signed)
Per Cancer center nurse:  Shortness of breath Started last wednesday  Tachycardia 140's Started today Cancer Received 14th treatment today

## 2023-09-04 NOTE — ED Provider Notes (Signed)
[place] EMERGENCY DEPARTMENT AT [facility] Provider Note   CSN: [id number] Arrival date & time: 09/04/23  1541     History  Chief Complaint  Patient presents with   Shortness of Breath   Tachycardia    Rebecca Ochoa is a 62 y.o. female w/ hx of HTN, smoking, nonsmall cell lung cancer, presenting to ED with SOB.  Reports worsening SOB for past 4-5 days.  Today at cancer radiation found to have tachycardia, HR 130-140's per review of records.  Completed treatment but sent to ED for further evaluation.    Patient been hospitalized and discharged last month in October for sepsis felt to be secondary to postobstructive pneumonia, treated with IV antibiotics.  Blood cultures are no growth to date.  Patient was also noted to be positive for COVID-19 at that time, though this felt to be likely incidental.  Patient was discharged in the hospital on oral antibiotics and prednisone and has completed antibiotic course.  Per my review of the records her heart rate in the hospital was 80-100 bpm on average.  Patient had port catheter placed in chest 09/01/23  Most recent CTA showing "postobstructive pneumonia, right upper lobe lung mass and large necrotic right hilar nodal mass invading the mediastinum, bilateral pulmonary metastatic lesions, large left hepatic lobe lesion concerning for metastatic disease>"  HPI     Home Medications Prior to Admission medications   Medication Sig Start Date End Date Taking? Authorizing Provider  acetaminophen (TYLENOL) 500 MG tablet Take 1,000 mg by mouth every 8 (eight) hours as needed for mild pain.    [provider]  albuterol (VENTOLIN HFA) 108 (90 Base) MCG/ACT inhaler Inhale 2 puffs into the lungs every 6 (six) hours as needed for wheezing or shortness of breath. 05/30/23   Raynelle Dick, NP  ALPRAZolam Prudy Feeler) 0.5 MG tablet Take 1 tablet (0.5 mg total) by mouth 2 (two) times daily as needed for anxiety. Patient taking  differently: Take 0.5 mg by mouth See admin instructions. Take 0.5 mg by mouth at bedtime and an additional 0.5 mg once a day as needed for anxiety 08/11/23   Raynelle Dick, NP  benzonatate (TESSALON) 100 MG capsule Take 1 capsule (100 mg total) by mouth 3 (three) times daily. 08/17/23   Rolly Salter, MD  cyanocobalamin 1000 MCG tablet Take 1 tablet (1,000 mcg total) by mouth daily. 08/18/23   Rolly Salter, MD  dextromethorphan-guaiFENesin [facility] DM) 30-600 MG 12hr tablet Take 1 tablet by mouth 2 (two) times daily. 08/17/23   Rolly Salter, MD  HYDROcodone bit-homatropine (HYCODAN) 5-1.5 MG/5ML syrup Take 5 mLs by mouth every 4 (four) hours as needed for cough. 08/24/23   Si Gaul, MD  HYDROcodone-acetaminophen (NORCO/VICODIN) 5-325 MG tablet Take 1 tablet by mouth every 6 (six) hours as needed. Patient not taking: Reported on 08/14/2023 07/21/23   Roemhildt, Lorin T, PA-C  lidocaine (LIDODERM) 5 % Place 1 patch onto the skin every 12 (twelve) hours. Remove & Discard patch within 12 hours or as directed by MD Patient not taking: Reported on 08/14/2023 07/04/23 07/03/24  Raynelle Dick, NP  lidocaine-prilocaine (EMLA) cream Apply to affected area once 09/05/23   Si Gaul, MD  meloxicam (MOBIC) 15 MG tablet Take 1/2 to 1 tablet  Daily  with Food  for Pain & Inflammation Patient taking differently: Take 7.5-15 mg by mouth See admin instructions. Take 7.5-15 mg by mouth once a day for pain and inflammation 07/04/23  Raynelle Dick, NP  ondansetron (ZOFRAN) 4 MG tablet Take 1 tablet (4 mg total) by mouth every 6 (six) hours as needed for nausea. 08/17/23   Rolly Salter, MD  ondansetron (ZOFRAN) 8 MG tablet Take 1 tablet (8 mg total) by mouth every 8 (eight) hours as needed for nausea or vomiting. Start on the third day after carboplatin. 09/05/23   Si Gaul, MD  predniSONE (DELTASONE) 10 MG tablet 3 tablet p.o. daily for 1 week followed by 2 tablet p.o. daily for 1  week followed by 1 tablet p.o. daily for 1 week 08/24/23   Si Gaul, MD  prochlorperazine (COMPAZINE) 10 MG tablet Take 1 tablet (10 mg total) by mouth every 6 (six) hours as needed for nausea or vomiting. 09/05/23   Si Gaul, MD  bisoprolol-hydrochlorothiazide [facility]) 5-6.25 MG tablet Take  1 tablet  Daily  for BP 06/06/22 07/11/22  [name], MD      Allergies    Biaxin [clarithromycin]    Review of Systems   Review of Systems  Physical Exam Updated Vital Signs BP (!) 159/97   Pulse (!) 127   Temp 98.1 F (36.7 C) (Oral)   Resp (!) 31   Ht 5\' 5"  (1.651 m)   Wt 73.9 kg   SpO2 96%   BMI 27.12 kg/m  Physical Exam Constitutional:      General: She is not in acute distress. HENT:     Head: Normocephalic and atraumatic.  Eyes:     Conjunctiva/sclera: Conjunctivae normal.     Pupils: Pupils are equal, round, and reactive to light.  Cardiovascular:     Rate and Rhythm: Regular rhythm. Tachycardia present.  Pulmonary:     Effort: Pulmonary effort is normal. No respiratory distress.  Abdominal:     General: There is no distension.     Tenderness: There is no abdominal tenderness.  Musculoskeletal:     Right lower leg: Edema present.     Left lower leg: Edema present.  Skin:    General: Skin is warm and dry.  Neurological:     General: No focal deficit present.     Mental Status: She is alert. Mental status is at baseline.  Psychiatric:        Mood and Affect: Mood normal.        Behavior: Behavior normal.     ED Results / Procedures / Treatments   Labs (all labs ordered are listed, but only abnormal results are displayed) Labs Reviewed  COMPREHENSIVE METABOLIC PANEL - Abnormal; Notable for the following components:      Result Value   CO2 21 (*)    Glucose, Bld 134 (*)    Albumin 3.3 (*)    All other components within normal limits  CBC WITH DIFFERENTIAL/PLATELET - Abnormal; Notable for the following components:   WBC 12.7 (*)    Hemoglobin  11.4 (*)    HCT 35.8 (*)    Neutro Abs 11.9 (*)    Lymphs Abs 0.3 (*)    All other components within normal limits  URINALYSIS, W/ REFLEX TO CULTURE (INFECTION SUSPECTED) - Abnormal; Notable for the following components:   Color, Urine AMBER (*)    Hgb urine dipstick SMALL (*)    Ketones, ur 5 (*)    Leukocytes,Ua TRACE (*)    Bacteria, UA RARE (*)    All other components within normal limits  SARS CORONAVIRUS 2 BY RT PCR  MRSA NEXT GEN BY PCR, NASAL  BRAIN NATRIURETIC PEPTIDE  APTT  PROTIME-INR  HEPARIN LEVEL (UNFRACTIONATED)  I-STAT CG4 LACTIC ACID, ED  TROPONIN I (HIGH SENSITIVITY)  TROPONIN I (HIGH SENSITIVITY)    EKG EKG Interpretation Date/Time:  Monday September 04 2023 15:51:17 EST Ventricular Rate:  131 PR Interval:  157 QRS Duration:  67 QT Interval:  291 QTC Calculation: 430 R Axis:   63  Text Interpretation: Sinus tachycardia Paired ventricular premature complexes Aberrant conduction of SV complex(es) Consider right atrial enlargement Confirmed by Alvester Chou [phone]) on 09/04/2023 4:26:40 PM  Radiology CT Angio Chest PE W and/or Wo Contrast  Addendum Date: 09/04/2023   ADDENDUM REPORT: 09/04/2023 20:51 ADDENDUM: Critical Value/emergent results were called by telephone at the time of interpretation on 09/04/2023 at 8:47 pm to provider Zubayr Bednarczyk , who verbally acknowledged these results. Electronically Signed   By: Genevive Bi M.D.   On: 09/04/2023 20:51   Result Date: 09/04/2023 CLINICAL DATA:  Bronchogenic carcinoma with pulmonary metastasis. Short of breath. Concern for pulmonary embolism. EXAM: CT ANGIOGRAPHY CHEST WITH CONTRAST TECHNIQUE: Multidetector CT imaging of the chest was performed using the standard protocol during bolus administration of intravenous contrast. Multiplanar CT image reconstructions and MIPs were obtained to evaluate the vascular anatomy. RADIATION DOSE REDUCTION: This exam was performed according to the departmental  dose-optimization program which includes automated exposure control, adjustment of the mA and/or kV according to patient size and/or use of iterative reconstruction technique. CONTRAST:  80mL OMNIPAQUE IOHEXOL 350 MG/ML SOLN COMPARISON:  CT chest 08/14/2023 FINDINGS: Cardiovascular: Partially occlusive filling defects within the LEFT upper lobe pulmonary artery, lingula lower pulmonary artery and lower lobe pulmonary arteries. Large filling defect within the RIGHT lower lobe pulmonary arteries extending into the segmental branches of the pulmonary arteries. The RIGHT upper lobe pulmonary arteries constricted by a large suprahilar pulmonary mass. Overall clot burden is severe however there is no evidence of RIGHT ventricular strain (RIGHT ventricle to LEFT ventricle diameter less than 1) Mediastinum/Nodes: No axillary or supraclavicular adenopathy. No mediastinal or hilar adenopathy. No pericardial fluid. Esophagus normal. Lungs/Pleura: Again demonstrated RIGHT upper lobe 5.5 cm mass and RIGHT perihilar 5.6 cm mass. The perihilar mass is slightly decreased from 8.5 cm. Bilateral innumerable large pulmonary nodules not changed in the interval. No pneumothorax. Upper Abdomen: Limited view of the liver, kidneys, pancreas are unremarkable. Normal adrenal glands. Liver has a fine nodular contour Musculoskeletal: No aggressive osseous lesion. Review of the MIP images confirms the above findings. IMPRESSION: 1. Bilateral acute pulmonary emboli within the proximal pulmonary arteries. Overall clot burden is severe. No CT evidence of RIGHT ventricular strain. 2. RIGHT upper lobe and RIGHT perihilar masses consistent with primary bronchogenic carcinoma. 3. Innumerable bilateral pulmonary metastasis. Electronically Signed: By: Genevive Bi M.D. On: 09/04/2023 20:39   DG Chest 2 View  Result Date: 09/04/2023 CLINICAL DATA:  Shortness of breath and tachycardia. Concern for pneumonia. Right upper lobe mass. Non-small cell  lung cancer. EXAM: CHEST - 2 VIEW COMPARISON:  Chest radiograph dated 08/14/2023. FINDINGS: Left subclavian catheter with tip at the cavoatrial junction. Persistent bilateral pulmonary opacities. No pleural effusion or pneumothorax. The cardiac silhouette is within normal limits. No acute osseous pathology. IMPRESSION: Persistent bilateral pulmonary opacities. Electronically Signed   By: Elgie Collard M.D.   On: 09/04/2023 18:40    Procedures .Critical Care  Performed by: Terald Sleeper, MD Authorized by: Terald Sleeper, MD   Critical care provider statement:    Critical care time (minutes):  45  Critical care time was exclusive of:  Separately billable procedures and treating other patients   Critical care was necessary to treat or prevent imminent or life-threatening deterioration of the following conditions:  Respiratory failure and circulatory failure   Critical care was time spent personally by me on the following activities:  Ordering and performing treatments and interventions, ordering and review of laboratory studies, ordering and review of radiographic studies, pulse oximetry, review of old charts, examination of patient and evaluation of patient's response to treatment   Care discussed with: admitting provider   Comments:     IV heparin for acute PE     Medications Ordered in ED Medications  heparin ADULT infusion 100 units/mL (25000 units/270mL) (1,150 Units/hr Intravenous New Bag/Given 09/04/23 2131)  acetaminophen (TYLENOL) tablet 650 mg (has no administration in time range)    Or  acetaminophen (TYLENOL) suppository 650 mg (has no administration in time range)  senna-docusate (Senokot-S) tablet 1 tablet (has no administration in time range)  ondansetron (ZOFRAN) tablet 4 mg (has no administration in time range)    Or  ondansetron (ZOFRAN) injection 4 mg (has no administration in time range)  Chlorhexidine Gluconate Cloth 2 % PADS 6 each (has no administration in  time range)  ALPRAZolam (XANAX) tablet 0.5 mg (has no administration in time range)  cyanocobalamin (VITAMIN B12) tablet 1,000 mcg (has no administration in time range)  ipratropium-albuterol (DUONEB) 0.5-2.5 (3) MG/3ML nebulizer solution 3 mL (has no administration in time range)  predniSONE (DELTASONE) tablet 20 mg (has no administration in time range)  sodium chloride 0.9 % bolus 1,000 mL (0 mLs Intravenous Stopped 09/04/23 2056)  iohexol (OMNIPAQUE) 350 MG/ML injection 80 mL (80 mLs Intravenous Contrast Given 09/04/23 1922)  heparin bolus via infusion 2,000 Units (2,000 Units Intravenous Bolus from Bag 09/04/23 2132)    ED Course/ Medical Decision Making/ A&P Clinical Course as of 09/04/23 2341  Mon Sep 04, 2023  1626 Lactic Acid, Venous: 1.6 [MT]  1925 HR 115,  IV fluids running, pending CT PE [MT]  2116 Dr Gaynell Face crit care - okay for medical admission, A/C, no emergent indication for lytic therapy or treatment [MT]  2209 ADmitted [MT]    Clinical Course User Index [MT] Terald Sleeper, MD                                 Medical Decision Making Amount and/or Complexity of Data Reviewed Labs: ordered. Decision-making details documented in ED Course. Radiology: ordered.  Risk Prescription drug management. Decision regarding hospitalization.   This patient presents to the ED with concern for shortness of breath, tachycardia. This involves an extensive number of treatment options, and is a complaint that carries with it a high risk of complications and morbidity.  The differential diagnosis includes PNA vs pleural effusion vs PTX vs CHF vs PE vs other  Co-morbidities that complicate the patient evaluation: immunosuppressed, higher risk for infection; malignancy concern for thrombosis  Additional history obtained from her female partner at bedside  External records from outside source obtained and reviewed including most recent hospitalization course and discharge summary  including CT scan, records from radiation treatment today  I ordered and personally interpreted labs.  The pertinent results include:  minor leukocytosis.  COVID-negative.  Troponins and BNP within normal limit  I ordered imaging studies including dg chest, CT PE I independently visualized and interpreted imaging which showed significant bilateral pulmonary embolism without  evidence of heart strain I agree with the radiologist interpretation  The patient was maintained on a cardiac monitor.  I personally viewed and interpreted the cardiac monitored which showed an underlying rhythm of: sinus tachycardia  Per my interpretation the patient's ECG shows sinus tachycardia without acute ischemia  I ordered medication including IV fluids for tachycardia, IV heparin for PE  I have reviewed the patients home medicines and have made adjustments as needed  After the interventions noted above, I reevaluated the patient and found that they have: stayed the same   Dispostion:  After consideration of the diagnostic results and the patients response to treatment, I feel that the patent would benefit from stepdown medical admission.  The case was discussed initially with critical care, patient was believed to be hemodynamically stable for stepdown monitoring overnight on anticoagulation.  Admitted to the hospitalist..         Final Clinical Impression(s) / ED Diagnoses Final diagnoses:  Other acute pulmonary embolism without acute cor pulmonale [facility])    Rx / DC Orders ED Discharge Orders     None         Kamoria Lucien, Kermit Balo, MD 09/04/23 2343

## 2023-09-04 NOTE — Progress Notes (Signed)
Patient over in clinic to be seen by Dr. Roselind Messier. Noted patients heart rate to be elevated 138 -146bpm. Patient reports worsening shortness of breath and chest discomfort. Patient completed 14 th radiation treatment to right lung today. Patient taken to ED for evaluation per Dr. Roselind Messier. Patient taken to room 12.   112/79,138-146 bpm, 97.8, 97%

## 2023-09-04 NOTE — Plan of Care (Signed)
Discussed with patient plan of care for the evening, pain management and admission questions with some teach back displayed.  What is important to the patient is her breathing at this time.  Problem: Education: Goal: Knowledge of General Education information will improve Description: Including pain rating scale, medication(s)/side effects and non-pharmacologic comfort measures Outcome: Progressing   Problem: Health Behavior/Discharge Planning: Goal: Ability to manage health-related needs will improve Outcome: Progressing

## 2023-09-04 NOTE — ED Notes (Signed)
ED TO INPATIENT HANDOFF REPORT  ED Nurse Name and Phone #: Cat  S Name/Age/Gender Rebecca Ochoa 62 y.o. female Room/Bed: WA12/WA12  Code Status   Code Status: Full Code  Home/SNF/Other Home Patient oriented to: self, place, time, and situation Is this baseline? Yes   Triage Complete: Triage complete  Chief Complaint Pulmonary embolism [facility]) [I26.99]  Triage Note Per Cancer center nurse:  Shortness of breath Started last wednesday  Tachycardia 140's Started today Cancer Received 14th treatment today    Allergies Allergies  Allergen Reactions   Biaxin [Clarithromycin] Nausea Only    Level of Care/Admitting Diagnosis ED Disposition     ED Disposition  Admit   Condition  --   Comment  Hospital Area: [facility] [[id number]]  Level of Care: Stepdown [14]  Admit to SDU based on following criteria: Hemodynamic compromise or significant risk of instability:  Patient requiring short term acute titration and management of vasoactive drips, and invasive monitoring (i.e., CVP and Arterial line).  May admit patient to [facility] or [facility] if equivalent level of care is available:: No  Covid Evaluation: Asymptomatic - no recent exposure (last 10 days) testing not required  Diagnosis: Pulmonary embolism [facility]) [[id number]]  Admitting Physician: Steffanie Rainwater [[id number]]  Attending Physician: Steffanie Rainwater [[id number]]  Certification:: I certify this patient will need inpatient services for at least 2 midnights  Expected Medical Readiness: 09/07/2023          B Medical/Surgery History Past Medical History:  Diagnosis Date   Anemia    Asthma, mild intermittent, well-controlled    GERD (gastroesophageal reflux disease)    Hx of migraines    Hyperlipidemia    Hypertension    Vitamin D deficiency    Past Surgical History:  Procedure Laterality Date   BREAST SURGERY     reduction   BRONCHIAL BIOPSY  08/08/2023   Procedure: BRONCHIAL  BIOPSIES;  Surgeon: Leslye Peer, MD;  Location: [facility] ENDOSCOPY;  Service: Pulmonary;;   BRONCHIAL BRUSHINGS  08/08/2023   Procedure: BRONCHIAL BRUSHINGS;  Surgeon: Leslye Peer, MD;  Location: [facility] ENDOSCOPY;  Service: Pulmonary;;   BRONCHIAL NEEDLE ASPIRATION BIOPSY  08/08/2023   Procedure: BRONCHIAL NEEDLE ASPIRATION BIOPSIES;  Surgeon: Leslye Peer, MD;  Location: [facility] ENDOSCOPY;  Service: Pulmonary;;   ENDOBRONCHIAL ULTRASOUND Bilateral 08/08/2023   Procedure: ENDOBRONCHIAL ULTRASOUND;  Surgeon: Leslye Peer, MD;  Location: [facility] ENDOSCOPY;  Service: Pulmonary;  Laterality: Bilateral;   HEMOSTASIS CONTROL  08/08/2023   Procedure: HEMOSTASIS CONTROL;  Surgeon: Leslye Peer, MD;  Location: [facility] ENDOSCOPY;  Service: Pulmonary;;   IR IMAGING GUIDED PORT INSERTION  09/01/2023   REDUCTION MAMMAPLASTY     TUBAL LIGATION       A IV Location/Drains/Wounds Patient Lines/Drains/Airways Status     Active Line/Drains/Airways     Name Placement date Placement time Site Days   Implanted Port 09/01/23 Left Chest 09/01/23  0944  Chest  3   Peripheral IV 09/04/23 20 G Left Antecubital 09/04/23  1732  Antecubital  less than 1            Intake/Output Last 24 hours No intake or output data in the 24 hours ending 09/04/23 2238  Labs/Imaging Results for orders placed or performed during the hospital encounter of 09/04/23 (from the past 48 hour(s))  I-Stat Lactic Acid, ED     Status: None   Collection Time: 09/04/23  4:20 PM  Result Value Ref Range   Lactic Acid,  Venous 1.6 0.5 - 1.9 mmol/L  Urinalysis, w/ Reflex to Culture (Infection Suspected) -Urine, Clean Catch     Status: Abnormal   Collection Time: 09/04/23  4:51 PM  Result Value Ref Range   Specimen Source URINE, CLEAN CATCH    Color, Urine AMBER (A) YELLOW    Comment: BIOCHEMICALS MAY BE AFFECTED BY COLOR   APPearance CLEAR CLEAR   Specific Gravity, Urine 1.024 1.005 - 1.030   pH 5.0 5.0 - 8.0   Glucose, UA NEGATIVE NEGATIVE  mg/dL   Hgb urine dipstick SMALL (A) NEGATIVE   Bilirubin Urine NEGATIVE NEGATIVE   Ketones, ur 5 (A) NEGATIVE mg/dL   Protein, ur NEGATIVE NEGATIVE mg/dL   Nitrite NEGATIVE NEGATIVE   Leukocytes,Ua TRACE (A) NEGATIVE   RBC / HPF 0-5 0 - 5 RBC/hpf   WBC, UA 6-10 0 - 5 WBC/hpf    Comment:        Reflex urine culture not performed if WBC <=10, OR if Squamous epithelial cells >5. If Squamous epithelial cells >5 suggest recollection.    Bacteria, UA RARE (A) NONE SEEN   Squamous Epithelial / HPF 0-5 0 - 5 /HPF   Mucus PRESENT     Comment: Performed at [facility], [address], [place], [place] [address]  SARS Coronavirus 2 by RT PCR (hospital order, performed in [facility] hospital lab) *cepheid single result test* Anterior Nasal Swab     Status: None   Collection Time: 09/04/23  4:54 PM   Specimen: Anterior Nasal Swab  Result Value Ref Range   SARS Coronavirus 2 by RT PCR NEGATIVE NEGATIVE    Comment: (NOTE) SARS-CoV-2 target nucleic acids are NOT DETECTED.  The SARS-CoV-2 RNA is generally detectable in upper and lower respiratory specimens during the acute phase of infection. The lowest concentration of SARS-CoV-2 viral copies this assay can detect is 250 copies / mL. A negative result does not preclude SARS-CoV-2 infection and should not be used as the sole basis for treatment or other patient management decisions.  A negative result may occur with improper specimen collection / handling, submission of specimen other than nasopharyngeal swab, presence of viral mutation(s) within the areas targeted by this assay, and inadequate number of viral copies (<250 copies / mL). A negative result must be combined with clinical observations, patient history, and epidemiological information.  Fact Sheet for Patients:   RoadLapTop.co.za  Fact Sheet for Healthcare Providers: http://kim-miller.com/  This test is not yet  approved or  cleared by the [place] FDA and has been authorized for detection and/or diagnosis of SARS-CoV-2 by FDA under an Emergency Use Authorization (EUA).  This EUA will remain in effect (meaning this test can be used) for the duration of the COVID-19 declaration under Section 564(b)(1) of the Act, 21 U.S.C. section 360bbb-3(b)(1), unless the authorization is terminated or revoked sooner.  Performed at [facility], [address] [place], [place] [address]   Comprehensive metabolic panel     Status: Abnormal   Collection Time: 09/04/23  6:02 PM  Result Value Ref Range   Sodium 138 135 - 145 mmol/L   Potassium 4.1 3.5 - 5.1 mmol/L   Chloride 104 98 - 111 mmol/L   CO2 21 (L) 22 - 32 mmol/L   Glucose, Bld 134 (H) 70 - 99 mg/dL    Comment: Glucose reference range applies only to samples taken after fasting for at least 8 hours.   BUN 12 8 - 23 mg/dL  Creatinine, Ser 0.68 0.44 - 1.00 mg/dL   Calcium 9.2 8.9 - 24.4 mg/dL   Total Protein 7.6 6.5 - 8.1 g/dL   Albumin 3.3 (L) 3.5 - 5.0 g/dL   AST 38 15 - 41 U/L   ALT 30 0 - 44 U/L   Alkaline Phosphatase 126 38 - 126 U/L   Total Bilirubin 1.1 <1.2 mg/dL   GFR, Estimated >01 >02 mL/min    Comment: (NOTE) Calculated using the CKD-EPI Creatinine Equation (2021)    Anion gap 13 5 - 15    Comment: Performed at [facility], [address] [place], [place] [address]  CBC with Differential     Status: Abnormal   Collection Time: 09/04/23  6:02 PM  Result Value Ref Range   WBC 12.7 (H) 4.0 - 10.5 K/uL   RBC 3.87 3.87 - 5.11 MIL/uL   Hemoglobin 11.4 (L) 12.0 - 15.0 g/dL   HCT 64.4 (L) 03.4 - 74.2 %   MCV 92.5 80.0 - 100.0 fL   MCH 29.5 26.0 - 34.0 pg   MCHC 31.8 30.0 - 36.0 g/dL   RDW 59.5 63.8 - 75.6 %   Platelets 168 150 - 400 K/uL   nRBC 0.0 0.0 - 0.2 %   Neutrophils Relative % 93 %   Neutro Abs 11.9 (H) 1.7 - 7.7 K/uL   Lymphocytes Relative 2 %   Lymphs Abs 0.3 (L) 0.7 - 4.0  K/uL   Monocytes Relative 4 %   Monocytes Absolute 0.5 0.1 - 1.0 K/uL   Eosinophils Relative 0 %   Eosinophils Absolute 0.0 0.0 - 0.5 K/uL   Basophils Relative 0 %   Basophils Absolute 0.0 0.0 - 0.1 K/uL   Immature Granulocytes 1 %   Abs Immature Granulocytes 0.07 0.00 - 0.07 K/uL    Comment: Performed at [facility], [address], [place], [place] [address]  Brain natriuretic peptide     Status: None   Collection Time: 09/04/23  6:02 PM  Result Value Ref Range   B Natriuretic Peptide 34.3 0.0 - 100.0 pg/mL    Comment: Performed at [facility], [address], [place], [place] [address]  Troponin I (High Sensitivity)     Status: None   Collection Time: 09/04/23  6:02 PM  Result Value Ref Range   Troponin I (High Sensitivity) 4 <18 ng/L    Comment: (NOTE) Elevated high sensitivity troponin I (hsTnI) values and significant  changes across serial measurements may suggest ACS but many other  chronic and acute conditions are known to elevate hsTnI results.  Refer to the "Links" section for chest pain algorithms and additional  guidance. Performed at [facility], [address] [place], [place] [address]   Troponin I (High Sensitivity)     Status: None   Collection Time: 09/04/23  8:18 PM  Result Value Ref Range   Troponin I (High Sensitivity) 3 <18 ng/L    Comment: (NOTE) Elevated high sensitivity troponin I (hsTnI) values and significant  changes across serial measurements may suggest ACS but many other  chronic and acute conditions are known to elevate hsTnI results.  Refer to the "Links" section for chest pain algorithms and additional  guidance. Performed at [facility], [address], [place], [place] [address]   APTT     Status: None   Collection Time: 09/04/23  9:23 PM  Result Value Ref Range   aPTT 29 24 - 36  seconds    Comment: Performed at [facility], [address], [place], [place] [address]  Protime-INR     Status: None   Collection Time: 09/04/23  9:23 PM  Result Value Ref Range   Prothrombin Time 14.9 11.4 - 15.2 seconds   INR 1.1 0.8 - 1.2    Comment: (NOTE) INR goal varies based on device and disease states. Performed at [facility], [address], [place], [place] [address]    CT Angio Chest PE W and/or Wo Contrast  Addendum Date: 09/04/2023   ADDENDUM REPORT: 09/04/2023 20:51 ADDENDUM: Critical Value/emergent results were called by telephone at the time of interpretation on 09/04/2023 at 8:47 pm to provider MATTHEW TRIFAN , who verbally acknowledged these results. Electronically Signed   By: Genevive Bi M.D.   On: 09/04/2023 20:51   Result Date: 09/04/2023 CLINICAL DATA:  Bronchogenic carcinoma with pulmonary metastasis. Short of breath. Concern for pulmonary embolism. EXAM: CT ANGIOGRAPHY CHEST WITH CONTRAST TECHNIQUE: Multidetector CT imaging of the chest was performed using the standard protocol during bolus administration of intravenous contrast. Multiplanar CT image reconstructions and MIPs were obtained to evaluate the vascular anatomy. RADIATION DOSE REDUCTION: This exam was performed according to the departmental dose-optimization program which includes automated exposure control, adjustment of the mA and/or kV according to patient size and/or use of iterative reconstruction technique. CONTRAST:  80mL OMNIPAQUE IOHEXOL 350 MG/ML SOLN COMPARISON:  CT chest 08/14/2023 FINDINGS: Cardiovascular: Partially occlusive filling defects within the LEFT upper lobe pulmonary artery, lingula lower pulmonary artery and lower lobe pulmonary arteries. Large filling defect within the RIGHT lower lobe pulmonary arteries extending into the segmental branches of the pulmonary arteries. The RIGHT upper lobe pulmonary arteries constricted by a large suprahilar pulmonary mass. Overall clot burden is severe however there is no evidence  of RIGHT ventricular strain (RIGHT ventricle to LEFT ventricle diameter less than 1) Mediastinum/Nodes: No axillary or supraclavicular adenopathy. No mediastinal or hilar adenopathy. No pericardial fluid. Esophagus normal. Lungs/Pleura: Again demonstrated RIGHT upper lobe 5.5 cm mass and RIGHT perihilar 5.6 cm mass. The perihilar mass is slightly decreased from 8.5 cm. Bilateral innumerable large pulmonary nodules not changed in the interval. No pneumothorax. Upper Abdomen: Limited view of the liver, kidneys, pancreas are unremarkable. Normal adrenal glands. Liver has a fine nodular contour Musculoskeletal: No aggressive osseous lesion. Review of the MIP images confirms the above findings. IMPRESSION: 1. Bilateral acute pulmonary emboli within the proximal pulmonary arteries. Overall clot burden is severe. No CT evidence of RIGHT ventricular strain. 2. RIGHT upper lobe and RIGHT perihilar masses consistent with primary bronchogenic carcinoma. 3. Innumerable bilateral pulmonary metastasis. Electronically Signed: By: Genevive Bi M.D. On: 09/04/2023 20:39   DG Chest 2 View  Result Date: 09/04/2023 CLINICAL DATA:  Shortness of breath and tachycardia. Concern for pneumonia. Right upper lobe mass. Non-small cell lung cancer. EXAM: CHEST - 2 VIEW COMPARISON:  Chest radiograph dated 08/14/2023. FINDINGS: Left subclavian catheter with tip at the cavoatrial junction. Persistent bilateral pulmonary opacities. No pleural effusion or pneumothorax. The cardiac silhouette is within normal limits. No acute osseous pathology. IMPRESSION: Persistent bilateral pulmonary opacities. Electronically Signed   By: Elgie Collard M.D.   On: 09/04/2023 18:40    Pending Labs Unresulted Labs (From admission, onward)     Start     Ordered   09/05/23 0400  Heparin level (unfractionated)  Once-Timed,   URGENT        09/04/23 2128  Vitals/Pain Today's Vitals   09/04/23 1800 09/04/23 1830 09/04/23 1900 09/04/23  2000  BP: 124/81 116/83 133/82 127/83  Pulse: (!) 119 (!) 117 (!) 116 (!) 114  Resp: (!) 25 (!) 22 (!) 28 20  Temp:   98.2 F (36.8 C)   SpO2: 94% 94% 94% 92%  Weight:      Height:      PainSc:        Isolation Precautions No active isolations  Medications Medications  heparin ADULT infusion 100 units/mL (25000 units/222mL) (1,150 Units/hr Intravenous New Bag/Given 09/04/23 2131)  acetaminophen (TYLENOL) tablet 650 mg (has no administration in time range)    Or  acetaminophen (TYLENOL) suppository 650 mg (has no administration in time range)  senna-docusate (Senokot-S) tablet 1 tablet (has no administration in time range)  ondansetron (ZOFRAN) tablet 4 mg (has no administration in time range)    Or  ondansetron (ZOFRAN) injection 4 mg (has no administration in time range)  sodium chloride 0.9 % bolus 1,000 mL (0 mLs Intravenous Stopped 09/04/23 2056)  iohexol (OMNIPAQUE) 350 MG/ML injection 80 mL (80 mLs Intravenous Contrast Given 09/04/23 1922)  heparin bolus via infusion 2,000 Units (2,000 Units Intravenous Bolus from Bag 09/04/23 2132)    Mobility walks with person assist     Focused Assessments Pulmonary Assessment Handoff:  Lung sounds:          R Recommendations: See Admitting Provider Note  Report given to:   Additional Notes:

## 2023-09-04 NOTE — Progress Notes (Signed)
PHARMACY - ANTICOAGULATION CONSULT NOTE  Pharmacy Consult for IV heparin Indication: pulmonary embolus  Allergies  Allergen Reactions   Biaxin [Clarithromycin] Nausea Only    Patient Measurements: Height: 5\' 5"  (165.1 cm) Weight: 73.9 kg (163 lb) IBW/kg (Calculated) : 57 Heparin Dosing Weight: 73.9 kg  Vital Signs: Temp: 98.2 F (36.8 C) (11/04 1900) BP: 127/83 (11/04 2000) Pulse Rate: 114 (11/04 2000)  Labs: Recent Labs    09/04/23 1802 09/04/23 2018  HGB 11.4*  --   HCT 35.8*  --   PLT 168  --   CREATININE 0.68  --   TROPONINIHS 4 3    Estimated Creatinine Clearance: 73.4 mL/min (by C-G formula based on SCr of 0.68 mg/dL).   Medical History: Past Medical History:  Diagnosis Date   Anemia    Asthma, mild intermittent, well-controlled    GERD (gastroesophageal reflux disease)    Hx of migraines    Hyperlipidemia    Hypertension    Vitamin D deficiency     Medications:  No prior to admission anticoagulation medications listed.  Assessment: Pharmacy consulted to dose IV heparin for this 62 yo female who presented with SOB and tachycardia.  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   Imaging: 11/4 CT Angio Chest: Bilateral acute pulmonary emboli within the proximal pulmonary arteries. Overall clot burden is severe. No CT evidence of RIGHT ventricular strain.   Goal of Therapy:  Heparin level 0.3-0.7 units/ml Monitor platelets by anticoagulation protocol: Yes   Plan:  Administer 2000 units IV heparin bolus per infusion and IV heparin continuous infusion at 1150 units/hr 6 hour heparin level Monitor daily heparin level, CBC, signs/symptoms of bleeding   Thank you for allowing pharmacy to be a part of this patient's care.  Selinda Eon, PharmD, BCPS Clinical Pharmacist [place] 09/04/2023 9:21 PM

## 2023-09-04 NOTE — H&P (Signed)
History and Physical    Patient: Rebecca Ochoa UJW:[id number] DOB: 01/30/1961 DOA: 09/04/2023 DOS: the patient was seen and examined on 09/04/2023 PCP: [name], MD  Patient coming from: Home  Chief Complaint:  Chief Complaint  Patient presents with   Shortness of Breath   Tachycardia   HPI: Rebecca Ochoa is a 62 y.o. female with medical history significant of HTN, asthma, HLD, tobacco use disorder and recent diagnosis of non-small cell lung cancer who presented to the ED with increased shortness of breath. Patient was admitted for sepsis secondary to postobstructive pneumonia about 3 weeks ago.  She completed treatment with oral antibiotics and prednisone in the outpatient.  Over the last week, she has been more fatigued with poor appetite.  She had a port catheter placed on Friday 7/11 and since then, she has had progressive dyspnea on exertion or significant shortness of breath.  She presented to her oncology center for fourteenths radiation therapy and was found to have HR in the 130s to 140s.  She was advised to present to the ED for further evaluation.  She denies any chest pain, dizziness, fevers, chills, hemoptysis or headaches.  ED course: Tachycardic to the 130s and tachypneic to the 20s, SBP in the 120s to 140s, SpO2 92 to 96% on room air.  Labs showed lactic acid 1.6, K+ 4.1, creatinine 0.68, WBC 12.7, Hgb 11.4, BNP 34.3, troponin 4->3, negative COVID test, UA with trace leuks, mild ketones and Hgb, WBC 6-10. CXR with persistent bilateral pulmonary opacities.  CTA PE study showed bilateral acute proximal PE with severe clot burden but no evidence of right ventricular strain. RUL and right perihilar masses unchanged.  Patient received 1 L IV NS bolus and was started on IV heparin.  Hospitalist was consulted for admission  Review of Systems: As mentioned in the history of present illness. All other systems reviewed and are negative. Past Medical History:  Diagnosis Date   Anemia     Asthma, mild intermittent, well-controlled    GERD (gastroesophageal reflux disease)    Hx of migraines    Hyperlipidemia    Hypertension    Vitamin D deficiency    Past Surgical History:  Procedure Laterality Date   BREAST SURGERY     reduction   BRONCHIAL BIOPSY  08/08/2023   Procedure: BRONCHIAL BIOPSIES;  Surgeon: Leslye Peer, MD;  Location: [facility] ENDOSCOPY;  Service: Pulmonary;;   BRONCHIAL BRUSHINGS  08/08/2023   Procedure: BRONCHIAL BRUSHINGS;  Surgeon: Leslye Peer, MD;  Location: [facility] ENDOSCOPY;  Service: Pulmonary;;   BRONCHIAL NEEDLE ASPIRATION BIOPSY  08/08/2023   Procedure: BRONCHIAL NEEDLE ASPIRATION BIOPSIES;  Surgeon: Leslye Peer, MD;  Location: [facility] ENDOSCOPY;  Service: Pulmonary;;   ENDOBRONCHIAL ULTRASOUND Bilateral 08/08/2023   Procedure: ENDOBRONCHIAL ULTRASOUND;  Surgeon: Leslye Peer, MD;  Location: [facility] ENDOSCOPY;  Service: Pulmonary;  Laterality: Bilateral;   HEMOSTASIS CONTROL  08/08/2023   Procedure: HEMOSTASIS CONTROL;  Surgeon: Leslye Peer, MD;  Location: [facility] ENDOSCOPY;  Service: Pulmonary;;   IR IMAGING GUIDED PORT INSERTION  09/01/2023   REDUCTION MAMMAPLASTY     TUBAL LIGATION     Social History:  reports that she has been smoking e-cigarettes. She has never used smokeless tobacco. She reports current alcohol use of about 3.0 standard drinks of alcohol per week. She reports that she does not use drugs.  Allergies  Allergen Reactions   Biaxin [Clarithromycin] Nausea Only    Family History  Problem Relation Age of Onset  Heart disease Father    Hypertension Father    Diabetes Father    Kidney disease Father     Prior to Admission medications   Medication Sig Start Date End Date Taking? Authorizing Provider  acetaminophen (TYLENOL) 500 MG tablet Take 1,000 mg by mouth every 8 (eight) hours as needed for mild pain.    [provider]  albuterol (VENTOLIN HFA) 108 (90 Base) MCG/ACT inhaler Inhale 2 puffs into the lungs every 6 (six)  hours as needed for wheezing or shortness of breath. 05/30/23   Raynelle Dick, NP  ALPRAZolam Prudy Feeler) 0.5 MG tablet Take 1 tablet (0.5 mg total) by mouth 2 (two) times daily as needed for anxiety. Patient taking differently: Take 0.5 mg by mouth See admin instructions. Take 0.5 mg by mouth at bedtime and an additional 0.5 mg once a day as needed for anxiety 08/11/23   Raynelle Dick, NP  benzonatate (TESSALON) 100 MG capsule Take 1 capsule (100 mg total) by mouth 3 (three) times daily. 08/17/23   Rolly Salter, MD  cyanocobalamin 1000 MCG tablet Take 1 tablet (1,000 mcg total) by mouth daily. 08/18/23   Rolly Salter, MD  dextromethorphan-guaiFENesin [facility] DM) 30-600 MG 12hr tablet Take 1 tablet by mouth 2 (two) times daily. 08/17/23   Rolly Salter, MD  HYDROcodone bit-homatropine (HYCODAN) 5-1.5 MG/5ML syrup Take 5 mLs by mouth every 4 (four) hours as needed for cough. 08/24/23   Si Gaul, MD  HYDROcodone-acetaminophen (NORCO/VICODIN) 5-325 MG tablet Take 1 tablet by mouth every 6 (six) hours as needed. Patient not taking: Reported on 08/14/2023 07/21/23   Roemhildt, Lorin T, PA-C  lidocaine (LIDODERM) 5 % Place 1 patch onto the skin every 12 (twelve) hours. Remove & Discard patch within 12 hours or as directed by MD Patient not taking: Reported on 08/14/2023 07/04/23 07/03/24  Raynelle Dick, NP  lidocaine-prilocaine (EMLA) cream Apply to affected area once 09/05/23   Si Gaul, MD  meloxicam (MOBIC) 15 MG tablet Take 1/2 to 1 tablet  Daily  with Food  for Pain & Inflammation Patient taking differently: Take 7.5-15 mg by mouth See admin instructions. Take 7.5-15 mg by mouth once a day for pain and inflammation 07/04/23   Raynelle Dick, NP  ondansetron (ZOFRAN) 4 MG tablet Take 1 tablet (4 mg total) by mouth every 6 (six) hours as needed for nausea. 08/17/23   Rolly Salter, MD  ondansetron (ZOFRAN) 8 MG tablet Take 1 tablet (8 mg total) by mouth every 8 (eight) hours  as needed for nausea or vomiting. Start on the third day after carboplatin. 09/05/23   Si Gaul, MD  predniSONE (DELTASONE) 10 MG tablet 3 tablet p.o. daily for 1 week followed by 2 tablet p.o. daily for 1 week followed by 1 tablet p.o. daily for 1 week 08/24/23   Si Gaul, MD  prochlorperazine (COMPAZINE) 10 MG tablet Take 1 tablet (10 mg total) by mouth every 6 (six) hours as needed for nausea or vomiting. 09/05/23   Si Gaul, MD  bisoprolol-hydrochlorothiazide [facility]) 5-6.25 MG tablet Take  1 tablet  Daily  for BP 06/06/22 07/11/22  [name], MD    Physical Exam: Vitals:   09/04/23 2000 09/04/23 2100 09/04/23 2200 09/04/23 2306  BP: 127/83 (!) 146/85 (!) 159/97   Pulse: (!) 114 (!) 124 (!) 127   Resp: 20 (!) 25 (!) 31   Temp:    98.1 F (36.7 C)  TempSrc:  Oral  SpO2: 92% 93% 96%   Weight:      Height:       General: Pleasant, well-appearing woman laying in bed. No acute distress. Head: Normocephalic. Atraumatic. CV: Tachycardic.  Regular rhythm. No murmurs, rubs, or gallops. No LE edema. Port on chest.  Pulmonary: Mildly tachypneic. Normal effort.  Mild expiratory wheezes.  No rales. Abdominal: Soft, nontender, nondistended. Normal bowel sounds. Extremities: Palpable radial and DP pulses. Normal ROM. Skin: Warm and dry. No obvious rash or lesions. Neuro: A&Ox3. Moves all extremities. Normal sensation. No focal deficit. Psych: Normal mood and affect  Data Reviewed:  EKG showed sinus tach with right atrial enlargement and PVCs  Assessment and Plan: Rebecca Ochoa is a 62 y.o. female with medical history significant of HTN, asthma, HLD, tobacco use disorder and recent diagnosis of non-small cell lung cancer who presented to the ED with increased shortness of breath.  # Acute bilateral PE Patient had a recent diagnosis of lung cancer with ongoing radiation treatment with shortness of breath, dyspnea on exertion, fatigue and tachycardia found to have  acute bilateral pulmonary embolism without right heart strain. She is hemodynamically stable with SBP in the 120s to 140s. She remains tachycardic and mildly tachypneic but without an oxygen requirement.  PE likely secondary to lung malignancy.  Will admit to stepdown unit with close monitoring of hemodynamics. 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 Patient follows with oncology in the outpatient and received dose #14 of radiation today. -Follow-up with oncology in the outpatient  # HTN BP stable with SBP in the 120s to 140s.  # Asthma Patient with mild expiratory wheezes on exam but no significant respiratory distress. -As needed DuoNebs  # Anxiety -Continue as needed Xanax  # Tobacco use disorder States she has not smoked or vape for over a week. -Encourage complete smoking cessation  # Recent postobstructive pneumonia -Continue prednisone taper, currently at 20 mg daily   Advance Care Planning:   Code Status: Full Code   Consults:   Family Communication: Discussed admission with partner at bedside  Severity of Illness: The appropriate patient status for this patient is INPATIENT. Inpatient status is judged to be reasonable and necessary in order to provide the required intensity of service to ensure the patient's safety. The patient's presenting symptoms, physical exam findings, and initial radiographic and laboratory data in the context of their chronic comorbidities is felt to place them at high risk for further clinical deterioration. Furthermore, it is not anticipated that the patient will be medically stable for discharge from the hospital within 2 midnights of admission.   * I certify that at the point of admission it is my clinical judgment that the patient will require inpatient hospital care spanning beyond 2 midnights from the point of admission due  to high intensity of service, high risk for further deterioration and high frequency of surveillance required.*  Author: Steffanie Rainwater, MD 09/04/2023 11:07 PM  For on call review [email or web address].

## 2023-09-05 ENCOUNTER — Inpatient Hospital Stay (HOSPITAL_COMMUNITY): Payer: No Typology Code available for payment source

## 2023-09-05 ENCOUNTER — Other Ambulatory Visit: Payer: Self-pay | Admitting: Physician Assistant

## 2023-09-05 ENCOUNTER — Inpatient Hospital Stay: Payer: No Typology Code available for payment source | Admitting: Physician Assistant

## 2023-09-05 ENCOUNTER — Inpatient Hospital Stay: Payer: No Typology Code available for payment source | Attending: Internal Medicine

## 2023-09-05 DIAGNOSIS — R0602 Shortness of breath: Secondary | ICD-10-CM

## 2023-09-05 DIAGNOSIS — I2699 Other pulmonary embolism without acute cor pulmonale: Secondary | ICD-10-CM

## 2023-09-05 DIAGNOSIS — R609 Edema, unspecified: Secondary | ICD-10-CM | POA: Diagnosis not present

## 2023-09-05 DIAGNOSIS — C3411 Malignant neoplasm of upper lobe, right bronchus or lung: Secondary | ICD-10-CM

## 2023-09-05 LAB — ECHOCARDIOGRAM COMPLETE
AR max vel: 2.04 cm2
AV Area VTI: 1.77 cm2
AV Area mean vel: 1.83 cm2
AV Mean grad: 4 mm[Hg]
AV Peak grad: 8.3 mm[Hg]
Ao pk vel: 1.44 m/s
Area-P 1/2: 5.88 cm2
Calc EF: 64.8 %
Height: 65 in
S' Lateral: 3.7 cm
Single Plane A2C EF: 78.7 %
Single Plane A4C EF: 54.7 %
Weight: 2641.99 [oz_av]

## 2023-09-05 LAB — CBC
HCT: 29.7 % — ABNORMAL LOW (ref 36.0–46.0)
Hemoglobin: 9.3 g/dL — ABNORMAL LOW (ref 12.0–15.0)
MCH: 28.9 pg (ref 26.0–34.0)
MCHC: 31.3 g/dL (ref 30.0–36.0)
MCV: 92.2 fL (ref 80.0–100.0)
Platelets: 134 10*3/uL — ABNORMAL LOW (ref 150–400)
RBC: 3.22 MIL/uL — ABNORMAL LOW (ref 3.87–5.11)
RDW: 14.3 % (ref 11.5–15.5)
WBC: 8.7 10*3/uL (ref 4.0–10.5)
nRBC: 0 % (ref 0.0–0.2)

## 2023-09-05 LAB — HEPARIN LEVEL (UNFRACTIONATED)
Heparin Unfractionated: 0.11 [IU]/mL — ABNORMAL LOW (ref 0.30–0.70)
Heparin Unfractionated: 0.19 [IU]/mL — ABNORMAL LOW (ref 0.30–0.70)
Heparin Unfractionated: 0.44 [IU]/mL (ref 0.30–0.70)

## 2023-09-05 LAB — BASIC METABOLIC PANEL
Anion gap: 8 (ref 5–15)
BUN: 11 mg/dL (ref 8–23)
CO2: 25 mmol/L (ref 22–32)
Calcium: 8.5 mg/dL — ABNORMAL LOW (ref 8.9–10.3)
Chloride: 106 mmol/L (ref 98–111)
Creatinine, Ser: 0.58 mg/dL (ref 0.44–1.00)
GFR, Estimated: >60 mL/min (ref >60)
Glucose, Bld: 97 mg/dL (ref 70–99)
Potassium: 3.6 mmol/L (ref 3.5–5.1)
Sodium: 139 mmol/L (ref 135–145)

## 2023-09-05 LAB — MRSA NEXT GEN BY PCR, NASAL: MRSA by PCR Next Gen: NOT DETECTED

## 2023-09-05 MED ORDER — PERFLUTREN LIPID MICROSPHERE
1.0000 mL | INTRAVENOUS | Status: AC | PRN
  Administered 2023-09-05: 2 mL via INTRAVENOUS

## 2023-09-05 MED ORDER — MAGIC MOUTHWASH: 5.0000 mL | Freq: Four times a day (QID) | ORAL | Status: DC | PRN

## 2023-09-05 MED ORDER — HYDROCODONE-ACETAMINOPHEN 5-325 MG PO TABS
1.0000 | ORAL_TABLET | Freq: Four times a day (QID) | ORAL | Status: DC | PRN
  Filled 2023-09-05: qty 1

## 2023-09-05 MED ORDER — SODIUM CHLORIDE 0.9% FLUSH
10.0000 mL | Freq: Two times a day (BID) | INTRAVENOUS | Status: DC
  Administered 2023-09-05: 10 mL

## 2023-09-05 MED ORDER — ALPRAZOLAM 0.5 MG PO TABS
0.5000 mg | ORAL_TABLET | Freq: Two times a day (BID) | ORAL | Status: DC | PRN
  Administered 2023-09-05 – 2023-09-06 (×2): 0.5 mg via ORAL
  Filled 2023-09-05 (×2): qty 1

## 2023-09-05 MED ORDER — HEPARIN (PORCINE) 25000 UT/250ML-% IV SOLN
1650.0000 [IU]/h | INTRAVENOUS | Status: DC
  Administered 2023-09-06: 1650 [IU]/h via INTRAVENOUS
  Filled 2023-09-05 (×2): qty 250

## 2023-09-05 MED ORDER — SUCRALFATE 1 GM/10ML PO SUSP
1.0000 g | Freq: Three times a day (TID) | ORAL | Status: DC
  Administered 2023-09-05 – 2023-09-07 (×8): 1 g via ORAL
  Filled 2023-09-05 (×8): qty 10

## 2023-09-05 MED ORDER — PANTOPRAZOLE SODIUM 40 MG PO TBEC
40.0000 mg | DELAYED_RELEASE_TABLET | Freq: Two times a day (BID) | ORAL | Status: DC
  Administered 2023-09-05 – 2023-09-07 (×5): 40 mg via ORAL
  Filled 2023-09-05 (×5): qty 1

## 2023-09-05 MED ORDER — SODIUM CHLORIDE 0.9% FLUSH
10.0000 mL | INTRAVENOUS | Status: DC | PRN
  Administered 2023-09-07: 10 mL

## 2023-09-05 MED ORDER — DEXTROMETHORPHAN POLISTIREX ER 30 MG/5ML PO SUER
30.0000 mg | Freq: Two times a day (BID) | ORAL | Status: DC
  Administered 2023-09-05 – 2023-09-07 (×5): 30 mg via ORAL
  Filled 2023-09-05 (×6): qty 5

## 2023-09-05 MED ORDER — BENZONATATE 100 MG PO CAPS
100.0000 mg | ORAL_CAPSULE | Freq: Three times a day (TID) | ORAL | Status: DC
  Administered 2023-09-05 – 2023-09-07 (×7): 100 mg via ORAL
  Filled 2023-09-05 (×7): qty 1

## 2023-09-05 MED ORDER — HEPARIN BOLUS VIA INFUSION
2000.0000 [IU] | Freq: Once | INTRAVENOUS | Status: AC
  Administered 2023-09-05: 2000 [IU] via INTRAVENOUS
  Filled 2023-09-05: qty 2000

## 2023-09-05 MED ORDER — ORAL CARE MOUTH RINSE: 15.0000 mL | OROMUCOSAL | Status: DC | PRN

## 2023-09-05 MED ORDER — MORPHINE SULFATE (PF) 2 MG/ML IV SOLN
2.0000 mg | INTRAVENOUS | Status: DC | PRN
  Administered 2023-09-06 (×3): 2 mg via INTRAVENOUS
  Filled 2023-09-05 (×3): qty 1

## 2023-09-05 MED ORDER — ALPRAZOLAM 0.5 MG PO TABS: 0.5000 mg | ORAL_TABLET | Freq: Every day | ORAL | Status: DC | PRN

## 2023-09-05 MED FILL — Fosaprepitant Dimeglumine For IV Infusion 150 MG (Base Eq): INTRAVENOUS | Qty: 5 | Status: AC

## 2023-09-05 NOTE — Progress Notes (Signed)
PHARMACY - ANTICOAGULATION CONSULT NOTE  Pharmacy Consult for Heparin Indication: pulmonary embolus and DVT  Allergies  Allergen Reactions   Biaxin [Clarithromycin] Nausea Only    Patient Measurements: Height: 5\' 5"  (165.1 cm) Weight: 74.9 kg (165 lb 2 oz) IBW/kg (Calculated) : 57 Heparin Dosing Weight: 72 kg  Vital Signs: Temp: 98 F (36.7 C) (11/05 2028) Temp Source: Oral (11/05 2028) BP: 138/79 (11/05 2028) Pulse Rate: 115 (11/05 2028)  Labs: Recent Labs    09/04/23 1802 09/04/23 2018 09/04/23 2123 09/05/23 0415 09/05/23 1123 09/05/23 2005  HGB 11.4*  --   --  9.3*  --   --   HCT 35.8*  --   --  29.7*  --   --   PLT 168  --   --  134*  --   --   APTT  --   --  29  --   --   --   LABPROT  --   --  14.9  --   --   --   INR  --   --  1.1  --   --   --   HEPARINUNFRC  --   --   --  0.11* 0.19* 0.44  CREATININE 0.68  --   --  0.58  --   --   TROPONINIHS 4 3  --   --   --   --     Estimated Creatinine Clearance: 73.9 mL/min (by C-G formula based on SCr of 0.58 mg/dL).   Medical History: Past Medical History:  Diagnosis Date   Anemia    Asthma, mild intermittent, well-controlled    GERD (gastroesophageal reflux disease)    Hx of migraines    Hyperlipidemia    Hypertension    Vitamin D deficiency     Medications:  Infusions:   heparin 1,650 Units/hr (09/05/23 1554)    Assessment: 65 yoF presented to ED on 11/4 with ShOB, tachycardia.  PMH significant for asthma, metastatic NSCLC, port placement 09/01/23, and recent postobstructive pneumonia, found to have bilateral PE and left lower extremity DVT.  Pharmacy is consulted to dose Heparin IV.  Today, 09/05/2023: Heparin level 0.44 - therapeutic on heparin 1650 units/hr Hgb 11.4 >> 9.3 from yesterday. Plts 168 >> 134 from yesterday. RN reports no s/sx of bleeding at this time. Pharmacy will continue to monitor.  Goal of Therapy:  Heparin level 0.3-0.7 units/ml Monitor platelets by anticoagulation protocol:  Yes   Plan:  Continue heparin gtt @1650  units/hr Check confirmatory heparin level in 6hrs Monitor heparin level, CBC, and s/sx of bleeding daily   Cherylin Mylar, PharmD Clinical Pharmacist  11/5/20248:47 PM

## 2023-09-05 NOTE — TOC Progression Note (Signed)
Transition of Care [facility]) - Progression Note    Patient Details  Name: Rebecca Ochoa MRN: [id number] Date of Birth: 1961-07-13  [facility] of [facility]) CM/SW Contact  Geni Bers, RN Phone Number: 09/05/2023, 2:01 PM  Clinical Narrative:    Home with spouse. TOC will follow for discharge needs.   Expected Discharge Plan: Home/Self Care Barriers to Discharge: No Barriers Identified  Expected Discharge Plan and Services       Living arrangements for the past 2 months: Single Family Home                                       Social Determinants of Health (SDOH) Interventions SDOH Screenings   Food Insecurity: No Food Insecurity (09/04/2023)  Housing: Low Risk  (09/04/2023)  Transportation Needs: No Transportation Needs (09/04/2023)  Utilities: Not At Risk (09/04/2023)  Depression (PHQ2-9): Low Risk  (06/04/2022)  Tobacco Use: High Risk (09/04/2023)    Readmission Risk Interventions     No data to display

## 2023-09-05 NOTE — Progress Notes (Signed)
PHARMACY - ANTICOAGULATION CONSULT NOTE  Pharmacy Consult for IV heparin Indication: pulmonary embolus  Allergies  Allergen Reactions   Biaxin [Clarithromycin] Nausea Only    Patient Measurements: Height: 5\' 5"  (165.1 cm) Weight: 74.9 kg (165 lb 2 oz) IBW/kg (Calculated) : 57 Heparin Dosing Weight: actual body weight  Vital Signs: Temp: 98.4 F (36.9 C) (11/05 0400) Temp Source: Oral (11/05 0400) BP: 124/71 (11/05 0400) Pulse Rate: 113 (11/05 0400)  Labs: Recent Labs    09/04/23 1802 09/04/23 2018 09/04/23 2123 09/05/23 0415  HGB 11.4*  --   --  9.3*  HCT 35.8*  --   --  29.7*  PLT 168  --   --  134*  APTT  --   --  29  --   LABPROT  --   --  14.9  --   INR  --   --  1.1  --   HEPARINUNFRC  --   --   --  0.11*  CREATININE 0.68  --   --  0.58  TROPONINIHS 4 3  --   --     Estimated Creatinine Clearance: 73.9 mL/min (by C-G formula based on SCr of 0.58 mg/dL).   Medical History: Past Medical History:  Diagnosis Date   Anemia    Asthma, mild intermittent, well-controlled    GERD (gastroesophageal reflux disease)    Hx of migraines    Hyperlipidemia    Hypertension    Vitamin D deficiency     Medications:  No prior to admission anticoagulation medications listed.  Assessment: Pharmacy consulted to dose IV heparin for this 62 yo female who presented with SOB and tachycardia.  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   Imaging: 11/4 CT Angio Chest: Bilateral acute pulmonary emboli within the proximal pulmonary arteries. Overall clot burden is severe. No CT evidence of RIGHT ventricular strain.  09/05/23 Heparin level = 0.11 (subtherapeutic) with heparin gtt @ 1150 units/hr Hgb 9.3 (down), Pltc  134 RN reports no interruption in therapy and no line issues; no bleeding reported  Goal of Therapy:  Heparin level 0.3-0.7  units/ml Monitor platelets by anticoagulation protocol: Yes   Plan:  Rebolus 2000 units IV heparin per infusion and increase IV heparin gtt to 1400 units/hr 6 hour heparin level after rate increase Monitor daily heparin level, CBC, signs/symptoms of bleeding   Thank you for allowing pharmacy to be a part of this patient's care.  Terrilee Files, PharmD 09/05/2023 5:22 AM

## 2023-09-05 NOTE — Plan of Care (Signed)
  Problem: Clinical Measurements: Goal: Diagnostic test results will improve Outcome: Progressing Goal: Respiratory complications will improve Outcome: Progressing Goal: Cardiovascular complication will be avoided Outcome: Progressing   

## 2023-09-05 NOTE — Progress Notes (Signed)
[facility] Progress Note Patient: Rebecca Ochoa UJW:[id number] DOB: 06-01-1961 DOA: 09/04/2023  DOS: the patient was seen and examined on 09/05/2023  Brief hospital course: PMH of non-small cell lung cancer recently diagnosed with radiation, HTN, asthma, HLD, smoker presents the hospital with complaints of shortness of breath. Recently hospitalized and treated for postobstructive pneumonia with antibiotic therapy. Recently completed her radiation therapy as well. Was about to start her chemotherapy 11/6. Found to have acute PE as well as DVT. Currently receiving IV heparin therapy.  Assessment and Plan: Acute bilateral PE. Presents with shortness of breath.  CT scan shows evidence of bilateral segmental PE with large clot burden. Echocardiogram is reassuring with no RV strain. For now we will continue with IV heparin. Monitor in the stepdown unit. Discussed in detail with regards to potential option including Lovenox as well as Eliquis and Xarelto. Patient will let us know about her preference tomorrow. We did to discuss with Dr. Arbutus Ped with regards to their preference as well. Patient will most likely will require lifelong anticoagulation. Less likely cardiac in the insertion that led to VTE formation although patient does report that symptoms started few days after placement of the Port-A-Cath.  Non-small cell lung cancer. Recently completed chemotherapy. Will monitor for now. Outpatient follow-up with Dr. Arbutus Ped. On prednisone.   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 Will add Carafate and PPI. Magic mouthwash as needed.  Anxiety. Continuing home regimen.  Smoker. Quit smoking only 1 week ago. Will monitor.  HTN. Blood pressure actually stable. Will hold medications.  Subjective: No nausea no vomiting no fever no chills.  Breathing better.  No chest pain.  Physical Exam: General: in moderate distress, No Rash Cardiovascular: S1 and S2 Present, No Murmur Respiratory:  Good respiratory effort, Bilateral Air entry present. No Crackles, No wheezes Abdomen: Bowel Sound present, No tenderness Extremities: Trace edema Neuro: Alert and oriented x3, no new focal deficit  Data Reviewed: I have Reviewed nursing notes, Vitals, and Lab results. Since last encounter, pertinent lab results CBC and CMP   . I have ordered test including CBC and CMP  .  Ordered lower extremity Doppler.  Disposition: Status is: Inpatient Remains inpatient appropriate because: Need for IV anticoagulation and later transition to oral.   Family Communication: Family at bedside Level of care: Progressive switch from stepdown to progressive Vitals:   09/05/23 1400 09/05/23 1500 09/05/23 1600 09/05/23 1700  BP: 112/67 124/75 124/71 138/74  Pulse: (!) 113 (!) 107 (!) 110 (!) 108  Resp:      Temp:   98.9 F (37.2 C)   TempSrc:   Oral   SpO2: 98% 98% 98% 98%  Weight:      Height:         Author: Lynden Oxford, MD 09/05/2023 8:01 PM  Please look on [email or web address] to find out who is on call.

## 2023-09-05 NOTE — Progress Notes (Signed)
  Echocardiogram 2D Echocardiogram has been performed.  Rebecca Ochoa Rebecca Ochoa 09/05/2023, 11:24 AM

## 2023-09-05 NOTE — Progress Notes (Signed)
PHARMACY - ANTICOAGULATION CONSULT NOTE  Pharmacy Consult for Heparin Indication: pulmonary embolus and DVT  Allergies  Allergen Reactions   Biaxin [Clarithromycin] Nausea Only    Patient Measurements: Height: 5\' 5"  (165.1 cm) Weight: 74.9 kg (165 lb 2 oz) IBW/kg (Calculated) : 57 Heparin Dosing Weight: 72 kg  Vital Signs: Temp: 98.4 F (36.9 C) (11/05 0400) Temp Source: Oral (11/05 0400) BP: 124/71 (11/05 0400) Pulse Rate: 118 (11/05 0600)  Labs: Recent Labs    09/04/23 1802 09/04/23 2018 09/04/23 2123 09/05/23 0415  HGB 11.4*  --   --  9.3*  HCT 35.8*  --   --  29.7*  PLT 168  --   --  134*  APTT  --   --  29  --   LABPROT  --   --  14.9  --   INR  --   --  1.1  --   HEPARINUNFRC  --   --   --  0.11*  CREATININE 0.68  --   --  0.58  TROPONINIHS 4 3  --   --     Estimated Creatinine Clearance: 73.9 mL/min (by C-G formula based on SCr of 0.58 mg/dL).   Medical History: Past Medical History:  Diagnosis Date   Anemia    Asthma, mild intermittent, well-controlled    GERD (gastroesophageal reflux disease)    Hx of migraines    Hyperlipidemia    Hypertension    Vitamin D deficiency     Medications:  Infusions:   heparin 1,400 Units/hr (09/05/23 0545)    Assessment: 45 yoF presented to ED on 11/4 with ShOB, tachycardia.  PMH significant for asthma, metastatic NSCLC, port placement 09/01/23, and recent postobstructive pneumonia, found to have bilateral PE and left lower extremity DVT.  Pharmacy is consulted to dose Heparin IV.   Today, 09/05/2023: Heparin level 0.19 on heparin 1400 units/hr  CBC:  Hgb decreased (11.4 to 9.3) and Plt decreased (168 to 134) RN reports no bleeding or complications, interruptions or pauses.    Goal of Therapy:  Heparin level 0.3-0.7 units/ml Monitor platelets by anticoagulation protocol: Yes   Plan:  Give heparin 2000 units bolus IV x 1 Increase to heparin IV infusion at 1650 units/hr Heparin level 6 hours after rate  change Daily heparin level and CBC    Lynann Beaver PharmD, BCPS [facility] main pharmacy [phone] 09/05/2023 12:28 PM

## 2023-09-05 NOTE — Radiation Completion Notes (Signed)
Patient Name: Rebecca Ochoa, Rebecca Ochoa MRN: [id number] Date of Birth: Sep 26, 1961 Referring Physician: Teena Dunk, M.D. Date of Service: 2023-09-05 Radiation Oncologist: Arnette Schaumann, M.D.  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

## 2023-09-05 NOTE — Progress Notes (Signed)
Bilateral lower extremity venous duplex has been completed. Preliminary results can be found in CV Proc through chart review.  Results were given to Dr. Allena Katz.  09/05/23 9:19 AM Olen Cordial RVT

## 2023-09-06 ENCOUNTER — Encounter (HOSPITAL_COMMUNITY): Payer: Self-pay

## 2023-09-06 ENCOUNTER — Inpatient Hospital Stay: Payer: No Typology Code available for payment source

## 2023-09-06 DIAGNOSIS — Z1883 Retained stone or crystalline fragments: Secondary | ICD-10-CM | POA: Diagnosis not present

## 2023-09-06 DIAGNOSIS — I2695 Cement embolism of pulmonary artery without acute cor pulmonale: Secondary | ICD-10-CM | POA: Diagnosis not present

## 2023-09-06 DIAGNOSIS — T8481XA Embolism due to internal orthopedic prosthetic devices, implants and grafts, initial encounter: Secondary | ICD-10-CM | POA: Diagnosis not present

## 2023-09-06 LAB — COMPREHENSIVE METABOLIC PANEL
ALT: 24 U/L (ref 0–44)
AST: 28 U/L (ref 15–41)
Albumin: 2.6 g/dL — ABNORMAL LOW (ref 3.5–5.0)
Alkaline Phosphatase: 95 U/L (ref 38–126)
Anion gap: 6 (ref 5–15)
BUN: 10 mg/dL (ref 8–23)
CO2: 27 mmol/L (ref 22–32)
Calcium: 8.5 mg/dL — ABNORMAL LOW (ref 8.9–10.3)
Chloride: 103 mmol/L (ref 98–111)
Creatinine, Ser: 0.64 mg/dL (ref 0.44–1.00)
GFR, Estimated: >60 mL/min (ref >60)
Glucose, Bld: 96 mg/dL (ref 70–99)
Potassium: 3.5 mmol/L (ref 3.5–5.1)
Sodium: 136 mmol/L (ref 135–145)
Total Bilirubin: 0.9 mg/dL (ref <1.2)
Total Protein: 6.2 g/dL — ABNORMAL LOW (ref 6.5–8.1)

## 2023-09-06 LAB — CBC WITH DIFFERENTIAL/PLATELET
Abs Immature Granulocytes: 0.03 10*3/uL (ref 0.00–0.07)
Basophils Absolute: 0 10*3/uL (ref 0.0–0.1)
Basophils Relative: 0 %
Eosinophils Absolute: 0.1 10*3/uL (ref 0.0–0.5)
Eosinophils Relative: 1 %
HCT: 27.2 % — ABNORMAL LOW (ref 36.0–46.0)
Hemoglobin: 8.4 g/dL — ABNORMAL LOW (ref 12.0–15.0)
Immature Granulocytes: 0 %
Lymphocytes Relative: 7 %
Lymphs Abs: 0.5 10*3/uL — ABNORMAL LOW (ref 0.7–4.0)
MCH: 28.9 pg (ref 26.0–34.0)
MCHC: 30.9 g/dL (ref 30.0–36.0)
MCV: 93.5 fL (ref 80.0–100.0)
Monocytes Absolute: 0.5 10*3/uL (ref 0.1–1.0)
Monocytes Relative: 7 %
Neutro Abs: 6 10*3/uL (ref 1.7–7.7)
Neutrophils Relative %: 85 %
Platelets: 126 10*3/uL — ABNORMAL LOW (ref 150–400)
RBC: 2.91 MIL/uL — ABNORMAL LOW (ref 3.87–5.11)
RDW: 14.1 % (ref 11.5–15.5)
WBC: 7.1 10*3/uL (ref 4.0–10.5)
nRBC: 0 % (ref 0.0–0.2)

## 2023-09-06 LAB — MAGNESIUM: Magnesium: 2.2 mg/dL (ref 1.7–2.4)

## 2023-09-06 LAB — HEPARIN LEVEL (UNFRACTIONATED): Heparin Unfractionated: 0.46 [IU]/mL (ref 0.30–0.70)

## 2023-09-06 MED ORDER — HYDROCODONE-ACETAMINOPHEN 5-325 MG PO TABS
1.0000 | ORAL_TABLET | ORAL | Status: DC | PRN
  Administered 2023-09-06 – 2023-09-07 (×2): 1 via ORAL
  Filled 2023-09-06: qty 2

## 2023-09-06 MED ORDER — APIXABAN 5 MG PO TABS
10.0000 mg | ORAL_TABLET | Freq: Two times a day (BID) | ORAL | Status: DC
  Administered 2023-09-06 – 2023-09-07 (×3): 10 mg via ORAL
  Filled 2023-09-06 (×3): qty 2

## 2023-09-06 MED ORDER — MORPHINE SULFATE (PF) 2 MG/ML IV SOLN: 2.0000 mg | INTRAVENOUS | Status: DC | PRN

## 2023-09-06 MED ORDER — APIXABAN 5 MG PO TABS: 5.0000 mg | ORAL_TABLET | Freq: Two times a day (BID) | ORAL | Status: DC

## 2023-09-06 NOTE — Progress Notes (Signed)
PHARMACY - ANTICOAGULATION CONSULT NOTE  Pharmacy Consult for Heparin Indication: pulmonary embolus and DVT  Allergies  Allergen Reactions   Biaxin [Clarithromycin] Nausea Only    Patient Measurements: Height: 5\' 5"  (165.1 cm) Weight: 74.9 kg (165 lb 2 oz) IBW/kg (Calculated) : 57 Heparin Dosing Weight: 72 kg  Vital Signs: Temp: 98.3 F (36.8 C) (11/06 0435) Temp Source: Oral (11/06 0435) BP: 118/75 (11/06 0435) Pulse Rate: 103 (11/06 0435)  Labs: Recent Labs    09/04/23 1802 09/04/23 1802 09/04/23 2018 09/04/23 2123 09/05/23 0415 09/05/23 1123 09/05/23 2005 09/06/23 0406  HGB 11.4*  --   --   --  9.3*  --   --  8.4*  HCT 35.8*  --   --   --  29.7*  --   --  27.2*  PLT 168  --   --   --  134*  --   --  126*  APTT  --   --   --  29  --   --   --   --   LABPROT  --   --   --  14.9  --   --   --   --   INR  --   --   --  1.1  --   --   --   --   HEPARINUNFRC  --    < >  --   --  0.11* 0.19* 0.44 0.46  CREATININE 0.68  --   --   --  0.58  --   --  0.64  TROPONINIHS 4  --  3  --   --   --   --   --    < > = values in this interval not displayed.    Estimated Creatinine Clearance: 73.9 mL/min (by C-G formula based on SCr of 0.64 mg/dL).   Medical History: Past Medical History:  Diagnosis Date   Anemia    Asthma, mild intermittent, well-controlled    GERD (gastroesophageal reflux disease)    Hx of migraines    Hyperlipidemia    Hypertension    Vitamin D deficiency     Medications:  Infusions:   heparin 1,650 Units/hr (09/05/23 1554)    Assessment: 43 yoF presented to ED on 11/4 with ShOB, tachycardia.  PMH significant for asthma, metastatic NSCLC, port placement 09/01/23, and recent postobstructive pneumonia, found to have bilateral PE and left lower extremity DVT.  Pharmacy is consulted to dose Heparin IV.  Today, 09/06/2023: Heparin level 0.46 - therapeutic on heparin 1650 units/hr Hgb 11.4 >> 9.3 >> 8.4 over last 2 days & Plts 168 >> 134 >> 126.  No  s/sx of bleeding reported   Goal of Therapy:  Heparin level 0.3-0.7 units/ml Monitor platelets by anticoagulation protocol: Yes   Plan:  Continue heparin gtt @1650  units/hr Monitor heparin level, CBC, and s/sx of bleeding daily F/U plans for long term anticoagulation  Terrilee Files, PharmD 11/6/20244:58 AM

## 2023-09-06 NOTE — Plan of Care (Signed)

## 2023-09-06 NOTE — Progress Notes (Signed)
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 and various liver and bone mets as well as celiac plexus lymphadenopathy -was to start treatment carboplatin paclitaxel Keytruda 11/6--- has received 14 fractions XRT already Recent hospitalization 10/14--10/17 COVID coincidental met criteria for sepsis was given Zosyn discharged on Augmentin Developed more fatigue poor appetite progressive dyspnea SOB tachycardic 130s tachypneic 20s CXR persistent bilateral pulmonary opacities CT chest bilateral acute proximal PE with severe clot burden as well as right ventricular strain 11/5 echocardiogram 60-65% normal function no regional wall motion abnormalities right ventricular systolic function normal valves not well-visualized no aortic stenosis: Acute DVT left popliteal posterior tibial and left peroneal veins   Plan  Acute bilateral PE NSCLC poorly differentiated status post XRT not on chemo PE precipitated likely secondary to underlying cancer history-unlikely secondary to Port-A-Cath Has received close to 30 hours of IV heparin so I feel it is appropriate to transition the patient given hemodynamic relative stability to DOAC She can progressively ambulate we will check her oxygen status the next several hours and do a desat screen She needs to mobilize, I-S for  pleuritic pain we can use a higher dose of Norco 1-2 every 4 as needed, may use morphine but spaced out to 2 mg every 3 as needed  HTN Not on any meds for this-monitor trends  Reflux Continue Protonix 40 twice daily, Carafate 1 g 3 times daily meals can continue Magic  mouthwash  Anxiety Continue home Xanax 0.5 twice daily or 0.5 at bedtime as needed  Tobacco abuse disorder for many years Recent postobstructive pneumonia Resolved and has no bearing on current presentation   DVT prophylaxis:   Status is: Inpatient Remains inpatient appropriate because:   Needs hemodynamic stability and 24 hours on DOAC prior to discharging home likely DC a.m.      Subjective: Awake coherent  Has some chest discomfort with deep breaths in No cough fever cold nausea vomiting  Objective + exam Vitals:   09/05/23 2044 09/05/23 2058 09/06/23 0026 09/06/23 0435  BP:   112/74 118/75  Pulse:   (!) 105 (!) 103  Resp: 18 (!) 23 20 20   Temp:   98.8 F (37.1 C) 98.3 F (36.8 C)  TempSrc:   Oral Oral  SpO2:   99% 99%  Weight:      Height:       Filed Weights   09/04/23 1553 09/04/23 2351  Weight: 73.9 kg 74.9 kg    Examination:  EOMI NCAT no focal deficit no icterus no pallor no wheeze Abdomen soft no rebound Trace lower extremity edema S1-S2 no murmur Neuro is intact no focal deficit  Data Reviewed: reviewed   CBC    Component Value Date/Time   WBC 7.1 09/06/2023 0406   RBC 2.91 (L) 09/06/2023 0406   HGB 8.4 (L) 09/06/2023 0406   HGB 11.1 (L) 08/24/2023 0819   HCT 27.2 (L) 09/06/2023 0406   PLT 126 (L) 09/06/2023 0406   PLT 316 08/24/2023 0819   MCV 93.5 09/06/2023 0406   MCH 28.9 09/06/2023 0406   MCHC 30.9 09/06/2023 0406   RDW  14.1 09/06/2023 0406   LYMPHSABS 0.5 (L) 09/06/2023 0406   MONOABS 0.5 09/06/2023 0406   EOSABS 0.1 09/06/2023 0406   BASOSABS 0.0 09/06/2023 0406      Latest Ref Rng & Units 09/06/2023    4:06 AM 09/05/2023    4:15 AM 09/04/2023    6:02 PM  CMP  Glucose 70 - 99 mg/dL 96  97  161   BUN 8 - 23 mg/dL 10  11  12    Creatinine 0.44 - 1.00 mg/dL 0.96  0.45  4.09   Sodium 135 - 145 mmol/L 136  139  138   Potassium 3.5 - 5.1 mmol/L 3.5  3.6  4.1   Chloride 98 - 111 mmol/L 103  106  104   CO2 22 - 32 mmol/L 27   25  21    Calcium 8.9 - 10.3 mg/dL 8.5  8.5  9.2   Total Protein 6.5 - 8.1 g/dL 6.2   7.6   Total Bilirubin <1.2 mg/dL 0.9   1.1   Alkaline Phos 38 - 126 U/L 95   126   AST 15 - 41 U/L 28   38   ALT 0 - 44 U/L 24   30      Scheduled Meds:  benzonatate  100 mg Oral TID   Chlorhexidine Gluconate Cloth  6 each Topical Daily   cyanocobalamin  1,000 mcg Oral Daily   dextromethorphan  30 mg Oral BID   pantoprazole  40 mg Oral BID   predniSONE  20 mg Oral Q breakfast   Followed by   Melene Muller ON 09/07/2023] predniSONE  10 mg Oral Q breakfast   sodium chloride flush  10-40 mL Intracatheter Q12H   sucralfate  1 g Oral TID WC & HS   Continuous Infusions:  heparin 1,650 Units/hr (09/06/23 0540)    Time  46  Rhetta Mura, MD  [facility]

## 2023-09-06 NOTE — Plan of Care (Signed)
  Problem: Clinical Measurements: Goal: Respiratory complications will improve Outcome: Progressing Goal: Cardiovascular complication will be avoided Outcome: Progressing   Problem: Coping: Goal: Level of anxiety will decrease Outcome: Progressing   Problem: Pain Management: Goal: General experience of comfort will improve Outcome: Progressing

## 2023-09-07 DIAGNOSIS — T8481XA Embolism due to internal orthopedic prosthetic devices, implants and grafts, initial encounter: Secondary | ICD-10-CM | POA: Diagnosis not present

## 2023-09-07 DIAGNOSIS — Z1883 Retained stone or crystalline fragments: Secondary | ICD-10-CM | POA: Diagnosis not present

## 2023-09-07 DIAGNOSIS — I2695 Cement embolism of pulmonary artery without acute cor pulmonale: Secondary | ICD-10-CM | POA: Diagnosis not present

## 2023-09-07 LAB — CBC
HCT: 26.7 % — ABNORMAL LOW (ref 36.0–46.0)
Hemoglobin: 8.2 g/dL — ABNORMAL LOW (ref 12.0–15.0)
MCH: 28.5 pg (ref 26.0–34.0)
MCHC: 30.7 g/dL (ref 30.0–36.0)
MCV: 92.7 fL (ref 80.0–100.0)
Platelets: 143 10*3/uL — ABNORMAL LOW (ref 150–400)
RBC: 2.88 MIL/uL — ABNORMAL LOW (ref 3.87–5.11)
RDW: 13.9 % (ref 11.5–15.5)
WBC: 6.3 10*3/uL (ref 4.0–10.5)
nRBC: 0 % (ref 0.0–0.2)

## 2023-09-07 MED ORDER — HEPARIN SOD (PORK) LOCK FLUSH 100 UNIT/ML IV SOLN
500.0000 [IU] | INTRAVENOUS | Status: AC | PRN
  Administered 2023-09-07: 500 [IU]
  Filled 2023-09-07: qty 5

## 2023-09-07 MED ORDER — HYDROCODONE-ACETAMINOPHEN 5-325 MG PO TABS: 1.0000 | ORAL_TABLET | Freq: Four times a day (QID) | ORAL | 0 refills | Status: DC | PRN

## 2023-09-07 MED ORDER — APIXABAN 5 MG PO TABS: ORAL_TABLET | ORAL | 4 refills | Status: DC

## 2023-09-07 MED ORDER — PREDNISONE 10 MG PO TABS: 5.0000 mg | ORAL_TABLET | Freq: Every day | ORAL | 0 refills | Status: DC

## 2023-09-07 MED ORDER — SUCRALFATE 1 GM/10ML PO SUSP: 1.0000 g | Freq: Three times a day (TID) | ORAL | 0 refills | Status: DC

## 2023-09-07 MED ORDER — PANTOPRAZOLE SODIUM 40 MG PO TBEC: 40.0000 mg | DELAYED_RELEASE_TABLET | Freq: Two times a day (BID) | ORAL | 2 refills | Status: DC

## 2023-09-07 MED ORDER — PREDNISONE 5 MG PO TABS
10.0000 mg | ORAL_TABLET | Freq: Once | ORAL | Status: AC
  Administered 2023-09-07: 10 mg via ORAL

## 2023-09-07 NOTE — Discharge Summary (Signed)
Physician Discharge Summary  Rebecca Ochoa WGN:[id number] DOB: 23-May-1961 DOA: 09/04/2023  PCP: [name], MD  Admit date: 09/04/2023 Discharge date: 09/07/2023  Time spent: 45 minutes  Recommendations for Outpatient Follow-up:  Requires lifelong anticoagulation secondary to submassive PE this hospitalization- Requires rescheduling of office visit-CC Dr. Santo Held appointment 09/06/2023 Needs CBC Chem-12 in about 1 week or at discretion of hematology Recommend Carafate slurry versus Carafate tablets in addition to Protonix short-term-if no improvement would recommend empiric coverage for esophagitis with Diflucan in the outpatient setting as per PCP   Discharge Diagnoses:  MAIN problem for hospitalization   Acute bilateral PE NSCLC poorly differentiated not yet on chemo Severe reflux with possible esophagitis Anxiety Tobacco abuse Normocytic anemia with mild thrombocytopenia probably from cancer physiology  Please see below for itemized issues addressed in HOpsital- refer to other progress notes for clarity if needed  Discharge Condition: Improved  Diet recommendation: Heart healthy  Filed Weights   09/04/23 1553 09/04/23 2351  Weight: 73.9 kg 74.9 kg    History of present illness:  62 year old white female known anemia asthma reflux migraine breast reduction surgery tage IVb [id number] 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 + large necrotic R mediastinal mass and various liver and bone mets as well as celiac plexus lymphadenopathy -was to start treatment carboplatin paclitaxel Keytruda 11/6- -- has received 14 fractions XRT already under care of radiation oncology  Recent hospitalization 10/14--10/17 COVID coincidental met criteria for sepsis was given Zosyn discharged on Augmentin  Had a recent Port-A-Cath placement 11/1  On 11/4 developed more fatigue poor appetite progressive dyspnea SOB tachycardic 130s tachypneic 20s CXR persistent bilateral  pulmonary opacities CT chest bilateral acute proximal PE with severe clot burden as well as right ventricular strain 11/5 echocardiogram 60-65% normal function no regional wall motion abnormalities right ventricular systolic function normal valves not well-visualized no aortic stenosis: Acute DVT left popliteal posterior tibial and left peroneal veins  Hospital Course:    Acute bilateral PE NSCLC poorly differentiated status post XRT not on chemo PE precipitated likely secondary to underlying cancer history Heparin given for close to 30 hours transitioned to Eliquis variable dosing  Not requiring statin at discharge She needs to mobilize, I-S for  pleuritic pain we can use a higher dose of Norco 1-2 every 4 as needed Prescribed Norco at discharge-advised to use Tylenol as first choice-advised to avoid NSAIDs  NSCLC Patient will need to be followed by Dr. Shirline Frees closely Patient is on tapering doses of prednisone to discontinue in the outpatient setting as per radiation oncologist  Nonsevere nasal bleed Only spotting of napkin with no clots when wiping and was probably secondary to dehumidified at nasal oxygen Hemoglobin is stable0----strict warning signs of large amounts of bleeding either rectally or nasally have been described to the patient and I had a short risk benefit discussion with her with regards to the need for anticoagulation given the massive PE that she has with severe clot burden   HTN Not on any meds for this-monitor trends   Reflux Continue Protonix 40 twice daily, Carafate 1 g 3 times daily meals can continue Magic mouthwash If she continues to have radiation esophagitis would workup and may benefit from coverage with antiviral/antifungal additionally given immunocompromise state   Anxiety Continue home Xanax 0.5 twice daily or 0.5 at bedtime as needed   Tobacco abuse disorder for many years Recent postobstructive pneumonia Resolved and has no bearing on current  presentation    Discharge Exam: Vitals:  09/06/23 2042 09/07/23 0551  BP: 121/77 115/76  Pulse: (!) 102 95  Resp: 20 18  Temp: 98 F (36.7 C) 98 F (36.7 C)  SpO2: 98% 96%    Subj on day of d/c   Awake coherent looks much better no distress Ambulated in the hallway according to nursing staff No chest pain Mild spotting of tissue with minimal nasal bleed  General Exam on discharge  EOMI NCAT no focal deficit no icterus no pallor Chest is clear with decreased air entry posterolaterally on the right Abdomen is soft no rebound No lower extremity edema  Discharge Instructions   Discharge Instructions     Diet - low sodium heart healthy   Complete by: As directed    Discharge instructions   Complete by: As directed    Please ensure that you take all of your meds as directed-you can use Tylenol as first choice for pain relatively high dose and then hydrocodone if you really need medication for further pain Note that you will be discharging on a blood thinner called Eliquis at a higher dose for several days and then a lower dose at the 7-day mark lifelong Continue to take the Protonix for your reflux the Carafate is essential to prevent you from having burning pain in your throat and this will coat your stomach-if it is a little difficult to afford you can use the tablet as a substitution and that can be mixed with water to create a slurry Please do not use Mobic or any nonsteroidals while you are on a blood thinner as this increases the risk of bleeding As we discussed high amounts of bleeding such as large amounts of rectal bleeding or nasal bleeding need to be evaluated in the emergency room Please call Dr. Rebecca Eaton office and ensure that he is aware of your discharge I will send him a copy of this note   Increase activity slowly   Complete by: As directed       Allergies as of 09/07/2023       Reactions   Biaxin [clarithromycin] Nausea Only        Medication  List     STOP taking these medications    benzonatate 100 MG capsule Commonly known as: TESSALON   dextromethorphan-guaiFENesin 30-600 MG 12hr tablet Commonly known as: MUCINEX DM   lidocaine 5 % Commonly known as: Lidoderm   lidocaine-prilocaine cream Commonly known as: EMLA   meloxicam 15 MG tablet Commonly known as: MOBIC       TAKE these medications    acetaminophen 500 MG tablet Commonly known as: TYLENOL Take 1,000 mg by mouth every 8 (eight) hours as needed for mild pain.   albuterol 108 (90 Base) MCG/ACT inhaler Commonly known as: VENTOLIN HFA Inhale 2 puffs into the lungs every 6 (six) hours as needed for wheezing or shortness of breath.   ALPRAZolam 0.5 MG tablet Commonly known as: XANAX Take 1 tablet (0.5 mg total) by mouth 2 (two) times daily as needed for anxiety. What changed:  when to take this additional instructions   apixaban 5 MG Tabs tablet Commonly known as: ELIQUIS Take 2 tablets (10 mg total) by mouth 2 (two) times daily for 6 days, THEN 1 tablet (5 mg total) 2 (two) times daily. Start taking on: September 07, 2023   cyanocobalamin 1000 MCG tablet Take 1 tablet (1,000 mcg total) by mouth daily.   HYDROcodone bit-homatropine 5-1.5 MG/5ML syrup Commonly known as: HYCODAN Take 5 mLs by mouth  every 4 (four) hours as needed for cough.   HYDROcodone-acetaminophen 5-325 MG tablet Commonly known as: NORCO/VICODIN Take 1 tablet by mouth every 6 (six) hours as needed.   ondansetron 4 MG tablet Commonly known as: ZOFRAN Take 1 tablet (4 mg total) by mouth every 6 (six) hours as needed for nausea.   ondansetron 8 MG tablet Commonly known as: Zofran Take 1 tablet (8 mg total) by mouth every 8 (eight) hours as needed for nausea or vomiting. Start on the third day after carboplatin.   pantoprazole 40 MG tablet Commonly known as: PROTONIX Take 1 tablet (40 mg total) by mouth 2 (two) times daily.   predniSONE 10 MG tablet Commonly known as:  DELTASONE Take 0.5 tablets (5 mg total) by mouth daily with breakfast. Start taking on: September 08, 2023 What changed:  how much to take how to take this when to take this additional instructions   prochlorperazine 10 MG tablet Commonly known as: COMPAZINE Take 1 tablet (10 mg total) by mouth every 6 (six) hours as needed for nausea or vomiting.   sucralfate 1 GM/10ML suspension Commonly known as: CARAFATE Take 10 mLs (1 g total) by mouth 4 (four) times daily -  with meals and at bedtime.       Allergies  Allergen Reactions   Biaxin [Clarithromycin] Nausea Only      The results of significant diagnostics from this hospitalization (including imaging, microbiology, ancillary and laboratory) are listed below for reference.    Significant Diagnostic Studies: ECHOCARDIOGRAM COMPLETE  Result Date: 09/05/2023    ECHOCARDIOGRAM REPORT   Patient Name:   Rebecca Ochoa Date of Exam: 09/05/2023 Medical Rec #:  [id number]    Height:       65.0 in Accession #:    [id number]   Weight:       165.1 lb Date of Birth:  12/16/1960     BSA:          1.823 m Patient Age:    62 years     BP:           142/73 mmHg Patient Gender: F            HR:           113 bpm. Exam Location:  Inpatient Procedure: 2D Echo, Cardiac Doppler and Color Doppler Indications:    pulmonary embolus  History:        Patient has no prior history of Echocardiogram examinations.                 COPD, Arrythmias:Tachycardia; Risk Factors:Hypertension and                 Dyslipidemia.  Sonographer:    Karma Ganja Referring Phys: [id number] PROSPER M AMPONSAH  Sonographer Comments: Suboptimal parasternal window. Image acquisition challenging due to COPD. IMPRESSIONS  1. Left ventricular ejection fraction, by estimation, is 60 to 65%. The left ventricle has normal function. The left ventricle has no regional wall motion abnormalities. Indeterminate diastolic filling due to E-A fusion.  2. Right ventricular systolic function is normal. The right  ventricular size is normal. Tricuspid regurgitation signal is inadequate for assessing PA pressure.  3. The mitral valve was not well visualized. No evidence of mitral valve regurgitation.  4. The aortic valve was not well visualized. Aortic valve regurgitation is not visualized. No aortic stenosis is present.  5. The inferior vena cava is normal in size with greater than 50% respiratory variability, suggesting right atrial  pressure of 3 mmHg. FINDINGS  Left Ventricle: Left ventricular ejection fraction, by estimation, is 60 to 65%. The left ventricle has normal function. The left ventricle has no regional wall motion abnormalities. Definity contrast agent was given IV to delineate the left ventricular  endocardial borders. The left ventricular internal cavity size was normal in size. There is no left ventricular hypertrophy. Indeterminate diastolic filling due to E-A fusion. Right Ventricle: The right ventricular size is normal. No increase in right ventricular wall thickness. Right ventricular systolic function is normal. Tricuspid regurgitation signal is inadequate for assessing PA pressure. Left Atrium: Left atrial size was not well visualized. Right Atrium: Right atrial size was not well visualized. Pericardium: There is no evidence of pericardial effusion. Presence of epicardial fat layer. Mitral Valve: The mitral valve was not well visualized. No evidence of mitral valve regurgitation. Tricuspid Valve: The tricuspid valve is grossly normal. Tricuspid valve regurgitation is not demonstrated. No evidence of tricuspid stenosis. Aortic Valve: The aortic valve was not well visualized. Aortic valve regurgitation is not visualized. No aortic stenosis is present. Aortic valve mean gradient measures 4.0 mmHg. Aortic valve peak gradient measures 8.3 mmHg. Aortic valve area, by VTI measures 1.77 cm. Pulmonic Valve: The pulmonic valve was not well visualized. Pulmonic valve regurgitation is not visualized. Aorta: The  aortic root is normal in size and structure. Venous: The inferior vena cava is normal in size with greater than 50% respiratory variability, suggesting right atrial pressure of 3 mmHg. IAS/Shunts: The interatrial septum was not well visualized.  LEFT VENTRICLE PLAX 2D LVIDd:         4.50 cm     Diastology LVIDs:         3.70 cm     LV e' medial:    6.42 cm/s LV PW:         0.90 cm     LV E/e' medial:  9.9 LV IVS:        1.00 cm     LV e' lateral:   7.51 cm/s LVOT diam:     2.00 cm     LV E/e' lateral: 8.4 LV SV:         37 LV SV Index:   20 LVOT Area:     3.14 cm  LV Volumes (MOD) LV vol d, MOD A2C: 37.7 ml LV vol d, MOD A4C: 64.4 ml LV vol s, MOD A2C: 8.0 ml LV vol s, MOD A4C: 29.2 ml LV SV MOD A2C:     29.7 ml LV SV MOD A4C:     64.4 ml LV SV MOD BP:      33.5 ml RIGHT VENTRICLE            IVC RV Basal diam:  2.70 cm    IVC diam: 1.50 cm RV S prime:     9.03 cm/s LEFT ATRIUM           Index        RIGHT ATRIUM           Index LA diam:      3.10 cm 1.70 cm/m   RA Area:     12.10 cm LA Vol (A2C): 24.8 ml 13.60 ml/m  RA Volume:   26.50 ml  14.53 ml/m LA Vol (A4C): 30.8 ml 16.89 ml/m  AORTIC VALVE AV Area (Vmax):    2.04 cm AV Area (Vmean):   1.83 cm AV Area (VTI):     1.77 cm AV Vmax:  144.00 cm/s AV Vmean:          95.000 cm/s AV VTI:            0.210 m AV Peak Grad:      8.3 mmHg AV Mean Grad:      4.0 mmHg LVOT Vmax:         93.40 cm/s LVOT Vmean:        55.300 cm/s LVOT VTI:          0.118 m LVOT/AV VTI ratio: 0.56  AORTA Ao Root diam: 3.10 cm MITRAL VALVE MV Area (PHT): 5.88 cm    SHUNTS MV Decel Time: 129 msec    Systemic VTI:  0.12 m MV E velocity: 63.40 cm/s  Systemic Diam: 2.00 cm MV A velocity: 86.10 cm/s MV E/A ratio:  0.74 Lennie Odor MD Electronically signed by Lennie Odor MD Signature Date/Time: 09/05/2023/1:37:27 PM    Final    VAS [place] LOWER EXTREMITY VENOUS (DVT)  Result Date: 09/05/2023  Lower Venous DVT Study Patient Name:  Rebecca Ochoa  Date of Exam:   09/05/2023 Medical Rec  #: [id number]     Accession #:    [id number] Date of Birth: 23-May-1961      Patient Gender: F Patient Age:   89 years Exam Location:  [facility] Procedure:      VAS [place] LOWER EXTREMITY VENOUS (DVT) Referring Phys: PRANAV PATEL --------------------------------------------------------------------------------  Indications: Edema, and pulmonary embolism.  Risk Factors: Cancer. Anticoagulation: Heparin. Comparison Study: No prior studies. Performing Technologist: Chanda Busing RVT  Examination Guidelines: A complete evaluation includes B-mode imaging, spectral Doppler, color Doppler, and power Doppler as needed of all accessible portions of each vessel. Bilateral testing is considered an integral part of a complete examination. Limited examinations for reoccurring indications may be performed as noted. The reflux portion of the exam is performed with the patient in reverse Trendelenburg.  +---------+---------------+---------+-----------+----------+--------------+ RIGHT    CompressibilityPhasicitySpontaneityPropertiesThrombus Aging +---------+---------------+---------+-----------+----------+--------------+ CFV      Full           Yes      Yes                                 +---------+---------------+---------+-----------+----------+--------------+ SFJ      Full                                                        +---------+---------------+---------+-----------+----------+--------------+ FV Prox  Full                                                        +---------+---------------+---------+-----------+----------+--------------+ FV Mid   Full                                                        +---------+---------------+---------+-----------+----------+--------------+ FV DistalFull                                                        +---------+---------------+---------+-----------+----------+--------------+  PFV      Full                                                         +---------+---------------+---------+-----------+----------+--------------+ POP      Full           Yes      Yes                                 +---------+---------------+---------+-----------+----------+--------------+ PTV      Full                                                        +---------+---------------+---------+-----------+----------+--------------+ PERO     Full                                                        +---------+---------------+---------+-----------+----------+--------------+   +---------+---------------+---------+-----------+----------+--------------+ LEFT     CompressibilityPhasicitySpontaneityPropertiesThrombus Aging +---------+---------------+---------+-----------+----------+--------------+ CFV      Full           Yes      Yes                                 +---------+---------------+---------+-----------+----------+--------------+ SFJ      Full                                                        +---------+---------------+---------+-----------+----------+--------------+ FV Prox  Full                                                        +---------+---------------+---------+-----------+----------+--------------+ FV Mid   Full                                                        +---------+---------------+---------+-----------+----------+--------------+ FV DistalFull                                                        +---------+---------------+---------+-----------+----------+--------------+ PFV      Full                                                        +---------+---------------+---------+-----------+----------+--------------+  POP      Partial        No       No                   Acute          +---------+---------------+---------+-----------+----------+--------------+ PTV      None                                         Acute           +---------+---------------+---------+-----------+----------+--------------+ PERO     None                                         Acute          +---------+---------------+---------+-----------+----------+--------------+ Gastroc  Full                                                        +---------+---------------+---------+-----------+----------+--------------+     Summary: RIGHT: - There is no evidence of deep vein thrombosis in the lower extremity.  - No cystic structure found in the popliteal fossa.  LEFT: - Findings consistent with acute deep vein thrombosis involving the left popliteal vein, left posterior tibial veins, and left peroneal veins.  - No cystic structure found in the popliteal fossa.  *See table(s) above for measurements and observations. Electronically signed by Lemar Livings MD on 09/05/2023 at 1:01:31 PM.    Final    CT Angio Chest PE W and/or Wo Contrast  Addendum Date: 09/04/2023   ADDENDUM REPORT: 09/04/2023 20:51 ADDENDUM: Critical Value/emergent results were called by telephone at the time of interpretation on 09/04/2023 at 8:47 pm to provider MATTHEW TRIFAN , who verbally acknowledged these results. Electronically Signed   By: Genevive Bi M.D.   On: 09/04/2023 20:51   Result Date: 09/04/2023 CLINICAL DATA:  Bronchogenic carcinoma with pulmonary metastasis. Short of breath. Concern for pulmonary embolism. EXAM: CT ANGIOGRAPHY CHEST WITH CONTRAST TECHNIQUE: Multidetector CT imaging of the chest was performed using the standard protocol during bolus administration of intravenous contrast. Multiplanar CT image reconstructions and MIPs were obtained to evaluate the vascular anatomy. RADIATION DOSE REDUCTION: This exam was performed according to the departmental dose-optimization program which includes automated exposure control, adjustment of the mA and/or kV according to patient size and/or use of iterative reconstruction technique. CONTRAST:  80mL OMNIPAQUE IOHEXOL 350  MG/ML SOLN COMPARISON:  CT chest 08/14/2023 FINDINGS: Cardiovascular: Partially occlusive filling defects within the LEFT upper lobe pulmonary artery, lingula lower pulmonary artery and lower lobe pulmonary arteries. Large filling defect within the RIGHT lower lobe pulmonary arteries extending into the segmental branches of the pulmonary arteries. The RIGHT upper lobe pulmonary arteries constricted by a large suprahilar pulmonary mass. Overall clot burden is severe however there is no evidence of RIGHT ventricular strain (RIGHT ventricle to LEFT ventricle diameter less than 1) Mediastinum/Nodes: No axillary or supraclavicular adenopathy. No mediastinal or hilar adenopathy. No pericardial fluid. Esophagus normal. Lungs/Pleura: Again demonstrated RIGHT upper lobe 5.5 cm mass and RIGHT perihilar 5.6 cm mass. The perihilar mass is slightly decreased from 8.5 cm. Bilateral innumerable large  pulmonary nodules not changed in the interval. No pneumothorax. Upper Abdomen: Limited view of the liver, kidneys, pancreas are unremarkable. Normal adrenal glands. Liver has a fine nodular contour Musculoskeletal: No aggressive osseous lesion. Review of the MIP images confirms the above findings. IMPRESSION: 1. Bilateral acute pulmonary emboli within the proximal pulmonary arteries. Overall clot burden is severe. No CT evidence of RIGHT ventricular strain. 2. RIGHT upper lobe and RIGHT perihilar masses consistent with primary bronchogenic carcinoma. 3. Innumerable bilateral pulmonary metastasis. Electronically Signed: By: Genevive Bi M.D. On: 09/04/2023 20:39   DG Chest 2 View  Result Date: 09/04/2023 CLINICAL DATA:  Shortness of breath and tachycardia. Concern for pneumonia. Right upper lobe mass. Non-small cell lung cancer. EXAM: CHEST - 2 VIEW COMPARISON:  Chest radiograph dated 08/14/2023. FINDINGS: Left subclavian catheter with tip at the cavoatrial junction. Persistent bilateral pulmonary opacities. No pleural effusion  or pneumothorax. The cardiac silhouette is within normal limits. No acute osseous pathology. IMPRESSION: Persistent bilateral pulmonary opacities. Electronically Signed   By: Elgie Collard M.D.   On: 09/04/2023 18:40   IR IMAGING GUIDED PORT INSERTION  Result Date: 09/01/2023 INDICATION: RIGHT lung cancer EXAM: IMPLANTED PORT A CATH PLACEMENT WITH ULTRASOUND AND FLUOROSCOPIC GUIDANCE MEDICATIONS: None; The antibiotic was administered within an appropriate time interval prior to skin puncture. ANESTHESIA/SEDATION: Moderate (conscious) sedation was employed during this procedure. A total of Versed 2 mg and Fentanyl 100 mcg was administered intravenously. Moderate Sedation Time: 26 minutes. The patient's level of consciousness and vital signs were monitored continuously by radiology nursing throughout the procedure under my direct supervision. FLUOROSCOPY TIME:  Fluoroscopic dose; 0 mGy COMPLICATIONS: None immediate. PROCEDURE: The procedure, risks, benefits, and alternatives were explained to the patient. Questions regarding the procedure were encouraged and answered. The patient understands and consents to the procedure. The LEFT neck and chest were prepped with chlorhexidine in a sterile fashion, and a sterile drape was applied covering the operative field. Maximum barrier sterile technique with sterile gowns and gloves were used for the procedure. A timeout was performed prior to the initiation of the procedure. Local anesthesia was provided with 1% lidocaine with epinephrine. After creating a small venotomy incision, a micropuncture kit was utilized to access the internal jugular vein under direct, real-time ultrasound guidance. Ultrasound image documentation was performed. The microwire was kinked to measure appropriate catheter length. A subcutaneous port pocket was then created along the upper chest wall utilizing a combination of sharp and blunt dissection. The pocket was irrigated with sterile saline.  A single lumen power injectable port was chosen for placement. The 8 Fr catheter was tunneled from the port pocket site to the venotomy incision. The port was placed in the pocket. The external catheter was trimmed to appropriate length. At the venotomy, an 8 Fr peel-away sheath was placed over a guidewire under fluoroscopic guidance. The catheter was then placed through the sheath and the sheath was removed. Final catheter positioning was confirmed and documented with a fluoroscopic spot radiograph. The port was accessed with a Huber needle, aspirated and flushed with heparinized saline. The port pocket incision was closed with interrupted 3-0 Vicryl suture then Dermabond was applied, including at the venotomy incision. Dressings were placed. The patient tolerated the procedure well without immediate post procedural complication. IMPRESSION: Successful placement of a LEFT internal jugular approach power injectable Port-A-Cath. The tip of the catheter is positioned within the proximal RIGHT atrium. The catheter is ready for immediate use. Roanna Banning, MD [facility]  [facility] Radiology Electronically Signed   By: Roanna Banning M.D.   On: 09/01/2023 11:44   NM PET Image Initial (PI) Skull Base To Thigh (F-18 FDG)  Result Date: 08/14/2023 CLINICAL DATA:  Initial treatment strategy for non-small cell lung cancer. EXAM: NUCLEAR MEDICINE PET SKULL BASE TO THIGH TECHNIQUE: 8.90 mCi F-18 FDG was injected intravenously. Full-ring PET imaging was performed from the skull base to thigh after the radiotracer. CT data was obtained and used for attenuation correction and anatomic localization. Fasting blood glucose: 118 mg/dl COMPARISON:  Chest CT 81/19/1478 FINDINGS: Mediastinal blood pool activity: SUV max 2.51 Liver activity: SUV max NA NECK: No hypermetabolic lymph nodes in the neck. Moderate symmetric hypermetabolism in the region of Waldeyer's ring likely inflammatory lymphoid  tissue. Incidental CT findings: None. CHEST: 5.5 cm right upper lobe lung mass is hypermetabolic with SUV max of 14.42. Adjacent large necrotic right hilar/mediastinal mass has an SUV max of 1214. Innumerable hypermetabolic pulmonary metastatic lesions. 17 mm right lower lobe lesion has an SUV max of 10.35. 13 mm right middle lobe lesion has an SUV max of 12.07. 14 mm left upper lobe nodule has an SUV max of 10.31. No hypermetabolic breast masses, supraclavicular or axillary adenopathy. Incidental CT findings: Stable age advanced atherosclerotic calcification involving the aorta and coronary arteries. ABDOMEN/PELVIS: Hepatic metastatic disease. 15 mm segment 7 lesion has an SUV max of 7.23. Partially necrotic 7 cm mass and segment 2 has an SUV max of 12.9 E. partially necrotic mass in segment 3 has an SUV max of 12.75. 14 mm celiac axis node has an SUV max 10.76 No adrenal gland metastasis.  Abdominal lymphadenopathy. Incidental CT findings: Age advanced atherosclerotic calcification involving the aorta and iliac arteries. No aneurysm. Moderate sigmoid colon diverticulosis. SKELETON: Hypermetabolic focus in the lower right scapula has an SUV max of 3.98. No obvious CT abnormality but this is certainly suspicious for an osseous metastasis. Small hypermetabolic focus in the L4 vertebral body has an SUV max of 4.06 and is also suspicious. I do not see any other definite lesions. Incidental CT findings: None. IMPRESSION: 1. 5.5 cm right upper lobe lung mass and large necrotic right hilar/mediastinal mass are hypermetabolic and consistent with known neoplasm. 2. Innumerable hypermetabolic pulmonary metastatic lesions. 3. Hepatic metastatic disease. 4. Hypermetabolic celiac axis lymph node. 5. Two small hypermetabolic bone lesions in the right scapula and L4 vertebral body are suspicious for osseous metastatic disease. Aortic Atherosclerosis (ICD10-I70.0). Electronically Signed   By: Rudie Meyer M.D.   On: 08/14/2023  14:49   CT Angio Chest PE W/Cm &/Or Wo Cm  Result Date: 08/14/2023 CLINICAL DATA:  Pulmonary embolism (PE) suspected. History of lung cancer. EXAM: CT ANGIOGRAPHY CHEST WITH CONTRAST TECHNIQUE: Multidetector CT imaging of the chest was performed using the standard protocol during bolus administration of intravenous contrast. Multiplanar CT image reconstructions and MIPs were obtained to evaluate the vascular anatomy. RADIATION DOSE REDUCTION: This exam was performed according to the departmental dose-optimization program which includes automated exposure control, adjustment of the mA and/or kV according to patient size and/or use of iterative reconstruction technique. CONTRAST:  75mL OMNIPAQUE IOHEXOL 350 MG/ML SOLN COMPARISON:  CT chest dated August 08, 2023. FINDINGS: Cardiovascular: Satisfactory opacification of the pulmonary arteries to the segmental level. No evidence of pulmonary embolism. There is narrowing of the right main and interlobar pulmonary arteries and compression of the right upper and middle lobe segmental arterial branches secondary to the large necrotic right hilar nodal mass, described below.  Normal heart size. Multivessel coronary artery calcifications. Atherosclerotic calcification of the thoracic aorta. No pericardial effusion. Mediastinum/Nodes: Redemonstration of a large necrotic nodal mass involving the right hilum and invading the mediastinum, similar to the prior exam. The esophagus is grossly unremarkable. Lungs/Pleura: Essentially stable large right upper lobe lung mass measuring approximately 5.6 x 4.7 cm, variations in measurement may be attributable to differences in slice selection. Innumerable bilateral pulmonary metastatic lesions are again noted. There are new patchy ground-glass and consolidative changes in the right mid lung (series 13, image 53). There is similar compression of the bronchus intermedius secondary to the large necrotic nodal mass of the right hilum. No  pneumothorax. Upper Abdomen: Unchanged large left hepatic heterogenous hypodense lesion measuring up to 7 cm. Otherwise, no acute findings within the visualized upper abdomen. Musculoskeletal: No suspicious osseous lesion. Review of the MIP images confirms the above findings. IMPRESSION: 1. No evidence of acute pulmonary embolism. 2. New patchy ground-glass and consolidative changes in the right mid lung may relate to a postobstructive infectious/inflammatory etiology secondary to the known large right hilar nodal mass. 3. Otherwise, no significant change compared to the prior examination dated August 08, 2023 with redemonstration of a dominant right upper lobe lung mass and large necrotic right hilar nodal mass invading the mediastinum. There is associated narrowing of the right main and interlobar pulmonary arteries and compression of the right upper and middle lobe segmental arterial branches. 4. Innumerable bilateral pulmonary metastatic lesions. 5. Unchanged large left hepatic lobe lesion, concerning for metastatic disease. 6.  Aortic Atherosclerosis (ICD10-I70.0). Electronically Signed   By: Hart Robinsons M.D.   On: 08/14/2023 14:48   DG Chest Port 1 View  Result Date: 08/14/2023 CLINICAL DATA:  History of lung cancer with shortness of breath EXAM: PORTABLE CHEST 1 VIEW COMPARISON:  X-ray 07/21/2019, CT 08/08/2023 FINDINGS: Underinflation with the elevated right hemidiaphragm. Multiple bilateral mass lesions are again seen, right-greater-than-left. Several are increased compared to the prior x-ray. Please correlate with more recent CT examination. No pneumothorax or effusion. Basilar atelectasis. Normal cardiopericardial silhouette. Calcified aorta. IMPRESSION: Multiple bilateral lung masses, increased compared to the prior x-ray. There also new elevation of the right hemidiaphragm. Please correlate with the more recent CT examinations. Electronically Signed   By: Karen Kays M.D.   On: 08/14/2023  10:41   DG C-Arm 1-60 Min-No Report  Result Date: 08/08/2023 Fluoroscopy was utilized by the requesting physician.  No radiographic interpretation.    Microbiology: Recent Results (from the past 240 hour(s))  SARS Coronavirus 2 by RT PCR (hospital order, performed in [facility] hospital lab) *cepheid single result test* Anterior Nasal Swab     Status: None   Collection Time: 09/04/23  4:54 PM   Specimen: Anterior Nasal Swab  Result Value Ref Range Status   SARS Coronavirus 2 by RT PCR NEGATIVE NEGATIVE Final    Comment: (NOTE) SARS-CoV-2 target nucleic acids are NOT DETECTED.  The SARS-CoV-2 RNA is generally detectable in upper and lower respiratory specimens during the acute phase of infection. The lowest concentration of SARS-CoV-2 viral copies this assay can detect is 250 copies / mL. A negative result does not preclude SARS-CoV-2 infection and should not be used as the sole basis for treatment or other patient management decisions.  A negative result may occur with improper specimen collection / handling, submission of specimen other than nasopharyngeal swab, presence of viral mutation(s) within the areas targeted by this assay, and inadequate number of viral copies (<250 copies /  mL). A negative result must be combined with clinical observations, patient history, and epidemiological information.  Fact Sheet for Patients:   RoadLapTop.co.za  Fact Sheet for Healthcare Providers: http://kim-miller.com/  This test is not yet approved or  cleared by the [place] FDA and has been authorized for detection and/or diagnosis of SARS-CoV-2 by FDA under an Emergency Use Authorization (EUA).  This EUA will remain in effect (meaning this test can be used) for the duration of the COVID-19 declaration under Section 564(b)(1) of the Act, 21 U.S.C. section 360bbb-3(b)(1), unless the authorization is terminated or revoked  sooner.  Performed at [facility], [address] [place], [place] [address]   MRSA Next Gen by PCR, Nasal     Status: None   Collection Time: 09/04/23 11:15 PM   Specimen: Nasal Mucosa; Nasal Swab  Result Value Ref Range Status   MRSA by PCR Next Gen NOT DETECTED NOT DETECTED Final    Comment: (NOTE) The GeneXpert MRSA Assay (FDA approved for NASAL specimens only), is one component of a comprehensive MRSA colonization surveillance program. It is not intended to diagnose MRSA infection nor to guide or monitor treatment for MRSA infections. Test performance is not FDA approved in patients less than 31 years old. Performed at [facility], [address], [place], [place] [address]      Labs: Basic Metabolic Panel: Recent Labs  Lab 09/04/23 1802 09/05/23 0415 09/06/23 0406  NA 138 139 136  K 4.1 3.6 3.5  CL 104 106 103  CO2 21* 25 27  GLUCOSE 134* 97 96  BUN 12 11 10   CREATININE 0.68 0.58 0.64  CALCIUM 9.2 8.5* 8.5*  MG  --   --  2.2   Liver Function Tests: Recent Labs  Lab 09/04/23 1802 09/06/23 0406  AST 38 28  ALT 30 24  ALKPHOS 126 95  BILITOT 1.1 0.9  PROT 7.6 6.2*  ALBUMIN 3.3* 2.6*   No results for input(s): "LIPASE", "AMYLASE" in the last 168 hours. No results for input(s): "AMMONIA" in the last 168 hours. CBC: Recent Labs  Lab 09/04/23 1802 09/05/23 0415 09/06/23 0406 09/07/23 0309  WBC 12.7* 8.7 7.1 6.3  NEUTROABS 11.9*  --  6.0  --   HGB 11.4* 9.3* 8.4* 8.2*  HCT 35.8* 29.7* 27.2* 26.7*  MCV 92.5 92.2 93.5 92.7  PLT 168 134* 126* 143*   Cardiac Enzymes: No results for input(s): "CKTOTAL", "CKMB", "CKMBINDEX", "TROPONINI" in the last 168 hours. BNP: BNP (last 3 results) Recent Labs    08/14/23 0953 09/04/23 1802  BNP 36.3 34.3    ProBNP (last 3 results) No results for input(s): "PROBNP" in the last 8760 hours.  CBG: No results for input(s): "GLUCAP" in the last 168  hours.     Signed:  Rhetta Mura MD   [facility] 09/07/2023, 9:59 AM

## 2023-09-07 NOTE — Progress Notes (Signed)
Discharge teaching complete. Meds, diet, activity, follow up appointments reviewed and all questions answered. Prescriptions sent to pharmacy. Patient discharged home via wheelchair with husband and son.

## 2023-09-07 NOTE — Progress Notes (Signed)
SATURATION QUALIFICATIONS: (This note is used to comply with regulatory documentation for home oxygen)  Patient Saturations on Room Air at Rest = 94%  Patient Saturations on Room Air while Ambulating = 92%  

## 2023-09-07 NOTE — Discharge Instructions (Signed)
Information on my medicine - ELIQUIS (apixaban)  This medication education was reviewed with me or my healthcare representative as part of my discharge preparation.  Why was Eliquis prescribed for you? Eliquis was prescribed to treat blood clots that may have been found in the veins of your legs (deep vein thrombosis) or in your lungs (pulmonary embolism) and to reduce the risk of them occurring again.  What do You need to know about Eliquis ? The starting dose is 10 mg (two 5 mg tablets) taken TWICE daily for the FIRST SEVEN (7) DAYS, then on (enter date)  09/03/2023  the dose is reduced to ONE 5 mg tablet taken TWICE daily.  Eliquis may be taken with or without food.   Try to take the dose about the same time in the morning and in the evening. If you have difficulty swallowing the tablet whole please discuss with your pharmacist how to take the medication safely.  Take Eliquis exactly as prescribed and DO NOT stop taking Eliquis without talking to the doctor who prescribed the medication.  Stopping may increase your risk of developing a new blood clot.  Refill your prescription before you run out.  After discharge, you should have regular check-up appointments with your healthcare provider that is prescribing your Eliquis.    What do you do if you miss a dose? If a dose of ELIQUIS is not taken at the scheduled time, take it as soon as possible on the same day and twice-daily administration should be resumed. The dose should not be doubled to make up for a missed dose.  Important Safety Information A possible side effect of Eliquis is bleeding. You should call your healthcare provider right away if you experience any of the following: Bleeding from an injury or your nose that does not stop. Unusual colored urine (red or dark brown) or unusual colored stools (red or black). Unusual bruising for unknown reasons. A serious fall or if you hit your head (even if there is no  bleeding).  Some medicines may interact with Eliquis and might increase your risk of bleeding or clotting while on Eliquis. To help avoid this, consult your healthcare provider or pharmacist prior to using any new prescription or non-prescription medications, including herbals, vitamins, non-steroidal anti-inflammatory drugs (NSAIDs) and supplements.  This website has more information on Eliquis (apixaban): http://www.eliquis.com/eliquis/home

## 2023-09-07 NOTE — Plan of Care (Signed)
  Problem: Clinical Measurements: Goal: Respiratory complications will improve Outcome: Progressing Goal: Cardiovascular complication will be avoided Outcome: Progressing   Problem: Activity: Goal: Risk for activity intolerance will decrease Outcome: Progressing   Problem: Coping: Goal: Level of anxiety will decrease Outcome: Progressing   Problem: Pain Management: Goal: General experience of comfort will improve Outcome: Progressing

## 2023-09-08 ENCOUNTER — Telehealth: Payer: Self-pay

## 2023-09-08 ENCOUNTER — Telehealth: Payer: Self-pay | Admitting: Internal Medicine

## 2023-09-08 ENCOUNTER — Ambulatory Visit: Payer: No Typology Code available for payment source

## 2023-09-08 NOTE — Progress Notes (Unsigned)
[facility] OFFICE PROGRESS NOTE  [name], MD [address] [place] [address]  DIAGNOSIS: Stage IVB ( T3, N2, M1c) non-Small Cell Lung Cancer, poorly differentiated squamous cell carcinoma presented with large right upper lobe lung mass in addition to large necrotic right hilar/mediastinal mass and innumerable hypermetabolic pulmonary metastatic lesions in addition to liver and bone metastasis as well as abdominal celiac axis lymphadenopathy diagnosed in October 2024.   PDL1: 0%  Molecular Studies: Positive for EGFR [id number]  PRIOR THERAPY: Palliative radiotherapy to the large hilar and mediastinal mass under the care of Dr. Roselind Messier last dose on 09/04/23  CURRENT THERAPY: Option 1: Targeted treatment with Tagrisso 80 mg p.o. daily with systemic chemotherapy with carboplatin for AUC of 5, alimta Mg/M2  vs option 2 single agent Gilotrif p.o. daily. Depends on if she has improvement with her EKG  INTERVAL HISTORY: Rebecca Ochoa 62 y.o. female returns to the clinic today for a follow up visit accompanied by her partner. The patient was last seen in the clinic by Dr. Arbutus Ped on 08/24/23. Th patient completed palliative radiation to lung under the care of Dr. Roselind Messier on 09/04/23.  Last week, she was expected to start systemic chemotherapy and immunotherapy. However, she was hospitalized for submassive PE. She is currently on eliquis. She was found to have acute DVT left popliteal posterior tibial and left peroneal veins. She also had severe reflux and esophagitis for which she is on  carafate.   Today, the patient is tachycardic with a pulse in the 140s.  She states that when she was at rest in the hospital her pulse was around 110.  When she was discharged and at home over the weekend her pulse was around 120 and it has been increasing.  She continues to have chest tightness/reticulocyte chest pain with deep breathing for which she takes Norco without any  improvement.  She continues to have similar shortness of breath.  Overall her symptoms are not better from the hospital but are not worse.  Her husband did mention she has some anxiety as well.  She had a Port-A-Cath placed in the interval since last being seen.  She has a prescription of Emla cream at home although it is not on her med list.  Denies any fever, chills, night sweats, or weight loss.  He denies any hemoptysis.  Dr. Arbutus Ped previously prescribed her codeine but she has not needed to take that.  Alternates between diarrhea and constipation.  No nausea or vomiting.  The patient is here today for evaluation and for a more detailed discussion about her current condition and treatment options.   MEDICAL HISTORY: Past Medical History:  Diagnosis Date   Anemia    Asthma, mild intermittent, well-controlled    GERD (gastroesophageal reflux disease)    Hx of migraines    Hyperlipidemia    Hypertension    Vitamin D deficiency     ALLERGIES:  is allergic to biaxin [clarithromycin].  MEDICATIONS:  Current Outpatient Medications  Medication Sig Dispense Refill   acetaminophen (TYLENOL) 500 MG tablet Take 1,000 mg by mouth every 8 (eight) hours as needed for mild pain.     albuterol (VENTOLIN HFA) 108 (90 Base) MCG/ACT inhaler Inhale 2 puffs into the lungs every 6 (six) hours as needed for wheezing or shortness of breath. 8 g 2   ALPRAZolam (XANAX) 0.5 MG tablet Take 1 tablet (0.5 mg total) by mouth 2 (two) times daily as needed for anxiety. (Patient  taking differently: Take 0.5 mg by mouth See admin instructions. Take 0.5 mg by mouth at bedtime and an additional 0.5 mg once a day as needed for anxiety) 60 tablet 0   apixaban (ELIQUIS) 5 MG TABS tablet Take 2 tablets (10 mg total) by mouth 2 (two) times daily for 6 days, THEN 1 tablet (5 mg total) 2 (two) times daily. 60 tablet 4   cyanocobalamin 1000 MCG tablet Take 1 tablet (1,000 mcg total) by mouth daily. 30 tablet 0   HYDROcodone  bit-homatropine (HYCODAN) 5-1.5 MG/5ML syrup Take 5 mLs by mouth every 4 (four) hours as needed for cough. 120 mL 0   HYDROcodone-acetaminophen (NORCO/VICODIN) 5-325 MG tablet Take 1 tablet by mouth every 6 (six) hours as needed. 21 tablet 0   ondansetron (ZOFRAN) 4 MG tablet Take 1 tablet (4 mg total) by mouth every 6 (six) hours as needed for nausea. 20 tablet 0   ondansetron (ZOFRAN) 8 MG tablet Take 1 tablet (8 mg total) by mouth every 8 (eight) hours as needed for nausea or vomiting. Start on the third day after carboplatin. 30 tablet 1   pantoprazole (PROTONIX) 40 MG tablet Take 1 tablet (40 mg total) by mouth 2 (two) times daily. 30 tablet 2   predniSONE (DELTASONE) 10 MG tablet Take 0.5 tablets (5 mg total) by mouth daily with breakfast. 5 tablet 0   prochlorperazine (COMPAZINE) 10 MG tablet Take 1 tablet (10 mg total) by mouth every 6 (six) hours as needed for nausea or vomiting. 30 tablet 1   sucralfate (CARAFATE) 1 GM/10ML suspension Take 10 mLs (1 g total) by mouth 4 (four) times daily -  with meals and at bedtime. 420 mL 0   No current facility-administered medications for this visit.    SURGICAL HISTORY:  Past Surgical History:  Procedure Laterality Date   BREAST SURGERY     reduction   BRONCHIAL BIOPSY  08/08/2023   Procedure: BRONCHIAL BIOPSIES;  Surgeon: Leslye Peer, MD;  Location: [facility] ENDOSCOPY;  Service: Pulmonary;;   BRONCHIAL BRUSHINGS  08/08/2023   Procedure: BRONCHIAL BRUSHINGS;  Surgeon: Leslye Peer, MD;  Location: [facility] ENDOSCOPY;  Service: Pulmonary;;   BRONCHIAL NEEDLE ASPIRATION BIOPSY  08/08/2023   Procedure: BRONCHIAL NEEDLE ASPIRATION BIOPSIES;  Surgeon: Leslye Peer, MD;  Location: [facility] ENDOSCOPY;  Service: Pulmonary;;   ENDOBRONCHIAL ULTRASOUND Bilateral 08/08/2023   Procedure: ENDOBRONCHIAL ULTRASOUND;  Surgeon: Leslye Peer, MD;  Location: [facility] ENDOSCOPY;  Service: Pulmonary;  Laterality: Bilateral;   HEMOSTASIS CONTROL  08/08/2023   Procedure: HEMOSTASIS  CONTROL;  Surgeon: Leslye Peer, MD;  Location: [facility] ENDOSCOPY;  Service: Pulmonary;;   IR IMAGING GUIDED PORT INSERTION  09/01/2023   REDUCTION MAMMAPLASTY     TUBAL LIGATION      REVIEW OF SYSTEMS:   Review of Systems  Constitutional: Negative for appetite change, chills, fatigue, fever and unexpected weight change.  HENT: Negative for mouth sores, nosebleeds, sore throat and trouble swallowing.   Eyes: Negative for eye problems and icterus.  Respiratory: Positive for improved cough. Positive for stable shortness of breath. Negative for hemoptysis and wheezing.   Cardiovascular: Positive for pleuritic chest pain.  Gastrointestinal: Negative for abdominal pain, constipation, diarrhea, nausea and vomiting.  Genitourinary: Negative for bladder incontinence, difficulty urinating, dysuria, frequency and hematuria.   Musculoskeletal: Negative for back pain, gait problem, neck pain and neck stiffness.  Skin: Negative for itching and rash.  Neurological: Negative for dizziness, extremity weakness, gait problem, headaches, light-headedness and seizures.  Hematological: Negative for adenopathy. Does not bruise/bleed easily.  Psychiatric/Behavioral: Negative for confusion, depression and sleep disturbance. The patient is not nervous/anxious.     PHYSICAL EXAMINATION:  There were no vitals taken for this visit.  ECOG PERFORMANCE STATUS: 1  Physical Exam  Constitutional: Oriented to person, place, and time and well-developed, well-nourished, and in no distress.  HENT:  Head: Normocephalic and atraumatic.  Mouth/Throat: Oropharynx is clear and moist. No oropharyngeal exudate.  Eyes: Conjunctivae are normal. Right eye exhibits no discharge. Left eye exhibits no discharge. No scleral icterus.  Neck: Normal range of motion. Neck supple.  Cardiovascular: Tachycardic, regular rhythm, normal heart sounds and intact distal pulses.   Pulmonary/Chest: Effort normal and breath sounds normal. No  respiratory distress. No wheezes. No rales.  Abdominal: Soft. Bowel sounds are normal. Exhibits no distension and no mass. There is no tenderness.  Musculoskeletal: Normal range of motion. Exhibits no edema.  Lymphadenopathy:    No cervical adenopathy.  Neurological: Alert and oriented to person, place, and time. Exhibits normal muscle tone. Gait normal. Coordination normal.  Skin: Skin is warm and dry. No rash noted. Not diaphoretic. No erythema. No pallor.  Psychiatric: Mood, memory and judgment normal.  Vitals reviewed.  LABORATORY DATA: Lab Results  Component Value Date   WBC 6.3 09/07/2023   HGB 8.2 (L) 09/07/2023   HCT 26.7 (L) 09/07/2023   MCV 92.7 09/07/2023   PLT 143 (L) 09/07/2023      Chemistry      Component Value Date/Time   NA 136 09/06/2023 0406   K 3.5 09/06/2023 0406   CL 103 09/06/2023 0406   CO2 27 09/06/2023 0406   BUN 10 09/06/2023 0406   CREATININE 0.64 09/06/2023 0406   CREATININE 0.75 08/24/2023 0819   CREATININE 1.05 10/12/2022 1442      Component Value Date/Time   CALCIUM 8.5 (L) 09/06/2023 0406   ALKPHOS 95 09/06/2023 0406   AST 28 09/06/2023 0406   AST 39 08/24/2023 0819   ALT 24 09/06/2023 0406   ALT 50 (H) 08/24/2023 0819   BILITOT 0.9 09/06/2023 0406   BILITOT 0.6 08/24/2023 0819       RADIOGRAPHIC STUDIES:  ECHOCARDIOGRAM COMPLETE  Result Date: 09/05/2023    ECHOCARDIOGRAM REPORT   Patient Name:   Rebecca Ochoa Date of Exam: 09/05/2023 Medical Rec #:  [id number]    Height:       65.0 in Accession #:    [id number]   Weight:       165.1 lb Date of Birth:  07-31-1961     BSA:          1.823 m Patient Age:    62 years     BP:           142/73 mmHg Patient Gender: F            HR:           113 bpm. Exam Location:  Inpatient Procedure: 2D Echo, Cardiac Doppler and Color Doppler Indications:    pulmonary embolus  History:        Patient has no prior history of Echocardiogram examinations.                 COPD, Arrythmias:Tachycardia; Risk  Factors:Hypertension and                 Dyslipidemia.  Sonographer:    Karma Ganja Referring Phys: [id number] PROSPER M AMPONSAH  Sonographer Comments: Suboptimal parasternal window. Image acquisition  challenging due to COPD. IMPRESSIONS  1. Left ventricular ejection fraction, by estimation, is 60 to 65%. The left ventricle has normal function. The left ventricle has no regional wall motion abnormalities. Indeterminate diastolic filling due to E-A fusion.  2. Right ventricular systolic function is normal. The right ventricular size is normal. Tricuspid regurgitation signal is inadequate for assessing PA pressure.  3. The mitral valve was not well visualized. No evidence of mitral valve regurgitation.  4. The aortic valve was not well visualized. Aortic valve regurgitation is not visualized. No aortic stenosis is present.  5. The inferior vena cava is normal in size with greater than 50% respiratory variability, suggesting right atrial pressure of 3 mmHg. FINDINGS  Left Ventricle: Left ventricular ejection fraction, by estimation, is 60 to 65%. The left ventricle has normal function. The left ventricle has no regional wall motion abnormalities. Definity contrast agent was given IV to delineate the left ventricular  endocardial borders. The left ventricular internal cavity size was normal in size. There is no left ventricular hypertrophy. Indeterminate diastolic filling due to E-A fusion. Right Ventricle: The right ventricular size is normal. No increase in right ventricular wall thickness. Right ventricular systolic function is normal. Tricuspid regurgitation signal is inadequate for assessing PA pressure. Left Atrium: Left atrial size was not well visualized. Right Atrium: Right atrial size was not well visualized. Pericardium: There is no evidence of pericardial effusion. Presence of epicardial fat layer. Mitral Valve: The mitral valve was not well visualized. No evidence of mitral valve regurgitation. Tricuspid  Valve: The tricuspid valve is grossly normal. Tricuspid valve regurgitation is not demonstrated. No evidence of tricuspid stenosis. Aortic Valve: The aortic valve was not well visualized. Aortic valve regurgitation is not visualized. No aortic stenosis is present. Aortic valve mean gradient measures 4.0 mmHg. Aortic valve peak gradient measures 8.3 mmHg. Aortic valve area, by VTI measures 1.77 cm. Pulmonic Valve: The pulmonic valve was not well visualized. Pulmonic valve regurgitation is not visualized. Aorta: The aortic root is normal in size and structure. Venous: The inferior vena cava is normal in size with greater than 50% respiratory variability, suggesting right atrial pressure of 3 mmHg. IAS/Shunts: The interatrial septum was not well visualized.  LEFT VENTRICLE PLAX 2D LVIDd:         4.50 cm     Diastology LVIDs:         3.70 cm     LV e' medial:    6.42 cm/s LV PW:         0.90 cm     LV E/e' medial:  9.9 LV IVS:        1.00 cm     LV e' lateral:   7.51 cm/s LVOT diam:     2.00 cm     LV E/e' lateral: 8.4 LV SV:         37 LV SV Index:   20 LVOT Area:     3.14 cm  LV Volumes (MOD) LV vol d, MOD A2C: 37.7 ml LV vol d, MOD A4C: 64.4 ml LV vol s, MOD A2C: 8.0 ml LV vol s, MOD A4C: 29.2 ml LV SV MOD A2C:     29.7 ml LV SV MOD A4C:     64.4 ml LV SV MOD BP:      33.5 ml RIGHT VENTRICLE            IVC RV Basal diam:  2.70 cm    IVC diam: 1.50 cm RV S prime:  9.03 cm/s LEFT ATRIUM           Index        RIGHT ATRIUM           Index LA diam:      3.10 cm 1.70 cm/m   RA Area:     12.10 cm LA Vol (A2C): 24.8 ml 13.60 ml/m  RA Volume:   26.50 ml  14.53 ml/m LA Vol (A4C): 30.8 ml 16.89 ml/m  AORTIC VALVE AV Area (Vmax):    2.04 cm AV Area (Vmean):   1.83 cm AV Area (VTI):     1.77 cm AV Vmax:           144.00 cm/s AV Vmean:          95.000 cm/s AV VTI:            0.210 m AV Peak Grad:      8.3 mmHg AV Mean Grad:      4.0 mmHg LVOT Vmax:         93.40 cm/s LVOT Vmean:        55.300 cm/s LVOT VTI:           0.118 m LVOT/AV VTI ratio: 0.56  AORTA Ao Root diam: 3.10 cm MITRAL VALVE MV Area (PHT): 5.88 cm    SHUNTS MV Decel Time: 129 msec    Systemic VTI:  0.12 m MV E velocity: 63.40 cm/s  Systemic Diam: 2.00 cm MV A velocity: 86.10 cm/s MV E/A ratio:  0.74 Lennie Odor MD Electronically signed by Lennie Odor MD Signature Date/Time: 09/05/2023/1:37:27 PM    Final    VAS [place] LOWER EXTREMITY VENOUS (DVT)  Result Date: 09/05/2023  Lower Venous DVT Study Patient Name:  LISSA OLIVA  Date of Exam:   09/05/2023 Medical Rec #: [id number]     Accession #:    [id number] Date of Birth: 02/23/1961      Patient Gender: F Patient Age:   39 years Exam Location:  [facility] Procedure:      VAS [place] LOWER EXTREMITY VENOUS (DVT) Referring Phys: PRANAV PATEL --------------------------------------------------------------------------------  Indications: Edema, and pulmonary embolism.  Risk Factors: Cancer. Anticoagulation: Heparin. Comparison Study: No prior studies. Performing Technologist: Chanda Busing RVT  Examination Guidelines: A complete evaluation includes B-mode imaging, spectral Doppler, color Doppler, and power Doppler as needed of all accessible portions of each vessel. Bilateral testing is considered an integral part of a complete examination. Limited examinations for reoccurring indications may be performed as noted. The reflux portion of the exam is performed with the patient in reverse Trendelenburg.  +---------+---------------+---------+-----------+----------+--------------+ RIGHT    CompressibilityPhasicitySpontaneityPropertiesThrombus Aging +---------+---------------+---------+-----------+----------+--------------+ CFV      Full           Yes      Yes                                 +---------+---------------+---------+-----------+----------+--------------+ SFJ      Full                                                         +---------+---------------+---------+-----------+----------+--------------+ FV Prox  Full                                                        +---------+---------------+---------+-----------+----------+--------------+  FV Mid   Full                                                        +---------+---------------+---------+-----------+----------+--------------+ FV DistalFull                                                        +---------+---------------+---------+-----------+----------+--------------+ PFV      Full                                                        +---------+---------------+---------+-----------+----------+--------------+ POP      Full           Yes      Yes                                 +---------+---------------+---------+-----------+----------+--------------+ PTV      Full                                                        +---------+---------------+---------+-----------+----------+--------------+ PERO     Full                                                        +---------+---------------+---------+-----------+----------+--------------+   +---------+---------------+---------+-----------+----------+--------------+ LEFT     CompressibilityPhasicitySpontaneityPropertiesThrombus Aging +---------+---------------+---------+-----------+----------+--------------+ CFV      Full           Yes      Yes                                 +---------+---------------+---------+-----------+----------+--------------+ SFJ      Full                                                        +---------+---------------+---------+-----------+----------+--------------+ FV Prox  Full                                                        +---------+---------------+---------+-----------+----------+--------------+ FV Mid   Full                                                         +---------+---------------+---------+-----------+----------+--------------+  FV DistalFull                                                        +---------+---------------+---------+-----------+----------+--------------+ PFV      Full                                                        +---------+---------------+---------+-----------+----------+--------------+ POP      Partial        No       No                   Acute          +---------+---------------+---------+-----------+----------+--------------+ PTV      None                                         Acute          +---------+---------------+---------+-----------+----------+--------------+ PERO     None                                         Acute          +---------+---------------+---------+-----------+----------+--------------+ Gastroc  Full                                                        +---------+---------------+---------+-----------+----------+--------------+     Summary: RIGHT: - There is no evidence of deep vein thrombosis in the lower extremity.  - No cystic structure found in the popliteal fossa.  LEFT: - Findings consistent with acute deep vein thrombosis involving the left popliteal vein, left posterior tibial veins, and left peroneal veins.  - No cystic structure found in the popliteal fossa.  *See table(s) above for measurements and observations. Electronically signed by Lemar Livings MD on 09/05/2023 at 1:01:31 PM.    Final    CT Angio Chest PE W and/or Wo Contrast  Addendum Date: 09/04/2023   ADDENDUM REPORT: 09/04/2023 20:51 ADDENDUM: Critical Value/emergent results were called by telephone at the time of interpretation on 09/04/2023 at 8:47 pm to provider MATTHEW TRIFAN , who verbally acknowledged these results. Electronically Signed   By: Genevive Bi M.D.   On: 09/04/2023 20:51   Result Date: 09/04/2023 CLINICAL DATA:  Bronchogenic carcinoma with pulmonary metastasis. Short of  breath. Concern for pulmonary embolism. EXAM: CT ANGIOGRAPHY CHEST WITH CONTRAST TECHNIQUE: Multidetector CT imaging of the chest was performed using the standard protocol during bolus administration of intravenous contrast. Multiplanar CT image reconstructions and MIPs were obtained to evaluate the vascular anatomy. RADIATION DOSE REDUCTION: This exam was performed according to the departmental dose-optimization program which includes automated exposure control, adjustment of the mA and/or kV according to patient size and/or use of iterative reconstruction technique. CONTRAST:  80mL OMNIPAQUE IOHEXOL 350 MG/ML SOLN COMPARISON:  CT chest 08/14/2023  FINDINGS: Cardiovascular: Partially occlusive filling defects within the LEFT upper lobe pulmonary artery, lingula lower pulmonary artery and lower lobe pulmonary arteries. Large filling defect within the RIGHT lower lobe pulmonary arteries extending into the segmental branches of the pulmonary arteries. The RIGHT upper lobe pulmonary arteries constricted by a large suprahilar pulmonary mass. Overall clot burden is severe however there is no evidence of RIGHT ventricular strain (RIGHT ventricle to LEFT ventricle diameter less than 1) Mediastinum/Nodes: No axillary or supraclavicular adenopathy. No mediastinal or hilar adenopathy. No pericardial fluid. Esophagus normal. Lungs/Pleura: Again demonstrated RIGHT upper lobe 5.5 cm mass and RIGHT perihilar 5.6 cm mass. The perihilar mass is slightly decreased from 8.5 cm. Bilateral innumerable large pulmonary nodules not changed in the interval. No pneumothorax. Upper Abdomen: Limited view of the liver, kidneys, pancreas are unremarkable. Normal adrenal glands. Liver has a fine nodular contour Musculoskeletal: No aggressive osseous lesion. Review of the MIP images confirms the above findings. IMPRESSION: 1. Bilateral acute pulmonary emboli within the proximal pulmonary arteries. Overall clot burden is severe. No CT evidence of  RIGHT ventricular strain. 2. RIGHT upper lobe and RIGHT perihilar masses consistent with primary bronchogenic carcinoma. 3. Innumerable bilateral pulmonary metastasis. Electronically Signed: By: Genevive Bi M.D. On: 09/04/2023 20:39   DG Chest 2 View  Result Date: 09/04/2023 CLINICAL DATA:  Shortness of breath and tachycardia. Concern for pneumonia. Right upper lobe mass. Non-small cell lung cancer. EXAM: CHEST - 2 VIEW COMPARISON:  Chest radiograph dated 08/14/2023. FINDINGS: Left subclavian catheter with tip at the cavoatrial junction. Persistent bilateral pulmonary opacities. No pleural effusion or pneumothorax. The cardiac silhouette is within normal limits. No acute osseous pathology. IMPRESSION: Persistent bilateral pulmonary opacities. Electronically Signed   By: Elgie Collard M.D.   On: 09/04/2023 18:40   IR IMAGING GUIDED PORT INSERTION  Result Date: 09/01/2023 INDICATION: RIGHT lung cancer EXAM: IMPLANTED PORT A CATH PLACEMENT WITH ULTRASOUND AND FLUOROSCOPIC GUIDANCE MEDICATIONS: None; The antibiotic was administered within an appropriate time interval prior to skin puncture. ANESTHESIA/SEDATION: Moderate (conscious) sedation was employed during this procedure. A total of Versed 2 mg and Fentanyl 100 mcg was administered intravenously. Moderate Sedation Time: 26 minutes. The patient's level of consciousness and vital signs were monitored continuously by radiology nursing throughout the procedure under my direct supervision. FLUOROSCOPY TIME:  Fluoroscopic dose; 0 mGy COMPLICATIONS: None immediate. PROCEDURE: The procedure, risks, benefits, and alternatives were explained to the patient. Questions regarding the procedure were encouraged and answered. The patient understands and consents to the procedure. The LEFT neck and chest were prepped with chlorhexidine in a sterile fashion, and a sterile drape was applied covering the operative field. Maximum barrier sterile technique with sterile  gowns and gloves were used for the procedure. A timeout was performed prior to the initiation of the procedure. Local anesthesia was provided with 1% lidocaine with epinephrine. After creating a small venotomy incision, a micropuncture kit was utilized to access the internal jugular vein under direct, real-time ultrasound guidance. Ultrasound image documentation was performed. The microwire was kinked to measure appropriate catheter length. A subcutaneous port pocket was then created along the upper chest wall utilizing a combination of sharp and blunt dissection. The pocket was irrigated with sterile saline. A single lumen power injectable port was chosen for placement. The 8 Fr catheter was tunneled from the port pocket site to the venotomy incision. The port was placed in the pocket. The external catheter was trimmed to appropriate length. At the venotomy, an 8 Fr peel-away sheath  was placed over a guidewire under fluoroscopic guidance. The catheter was then placed through the sheath and the sheath was removed. Final catheter positioning was confirmed and documented with a fluoroscopic spot radiograph. The port was accessed with a Huber needle, aspirated and flushed with heparinized saline. The port pocket incision was closed with interrupted 3-0 Vicryl suture then Dermabond was applied, including at the venotomy incision. Dressings were placed. The patient tolerated the procedure well without immediate post procedural complication. IMPRESSION: Successful placement of a LEFT internal jugular approach power injectable Port-A-Cath. The tip of the catheter is positioned within the proximal RIGHT atrium. The catheter is ready for immediate use. Roanna Banning, MD [facility] Electronically Signed   By: Roanna Banning M.D.   On: 09/01/2023 11:44   NM PET Image Initial (PI) Skull Base To Thigh (F-18 FDG)  Result Date: 08/14/2023 CLINICAL DATA:  Initial  treatment strategy for non-small cell lung cancer. EXAM: NUCLEAR MEDICINE PET SKULL BASE TO THIGH TECHNIQUE: 8.90 mCi F-18 FDG was injected intravenously. Full-ring PET imaging was performed from the skull base to thigh after the radiotracer. CT data was obtained and used for attenuation correction and anatomic localization. Fasting blood glucose: 118 mg/dl COMPARISON:  Chest CT 62/13/0865 FINDINGS: Mediastinal blood pool activity: SUV max 2.51 Liver activity: SUV max NA NECK: No hypermetabolic lymph nodes in the neck. Moderate symmetric hypermetabolism in the region of Waldeyer's ring likely inflammatory lymphoid tissue. Incidental CT findings: None. CHEST: 5.5 cm right upper lobe lung mass is hypermetabolic with SUV max of 14.42. Adjacent large necrotic right hilar/mediastinal mass has an SUV max of 1214. Innumerable hypermetabolic pulmonary metastatic lesions. 17 mm right lower lobe lesion has an SUV max of 10.35. 13 mm right middle lobe lesion has an SUV max of 12.07. 14 mm left upper lobe nodule has an SUV max of 10.31. No hypermetabolic breast masses, supraclavicular or axillary adenopathy. Incidental CT findings: Stable age advanced atherosclerotic calcification involving the aorta and coronary arteries. ABDOMEN/PELVIS: Hepatic metastatic disease. 15 mm segment 7 lesion has an SUV max of 7.23. Partially necrotic 7 cm mass and segment 2 has an SUV max of 12.9 E. partially necrotic mass in segment 3 has an SUV max of 12.75. 14 mm celiac axis node has an SUV max 10.76 No adrenal gland metastasis.  Abdominal lymphadenopathy. Incidental CT findings: Age advanced atherosclerotic calcification involving the aorta and iliac arteries. No aneurysm. Moderate sigmoid colon diverticulosis. SKELETON: Hypermetabolic focus in the lower right scapula has an SUV max of 3.98. No obvious CT abnormality but this is certainly suspicious for an osseous metastasis. Small hypermetabolic focus in the L4 vertebral body has an SUV max  of 4.06 and is also suspicious. I do not see any other definite lesions. Incidental CT findings: None. IMPRESSION: 1. 5.5 cm right upper lobe lung mass and large necrotic right hilar/mediastinal mass are hypermetabolic and consistent with known neoplasm. 2. Innumerable hypermetabolic pulmonary metastatic lesions. 3. Hepatic metastatic disease. 4. Hypermetabolic celiac axis lymph node. 5. Two small hypermetabolic bone lesions in the right scapula and L4 vertebral body are suspicious for osseous metastatic disease. Aortic Atherosclerosis (ICD10-I70.0). Electronically Signed   By: Rudie Meyer M.D.   On: 08/14/2023 14:49   CT Angio Chest PE W/Cm &/Or Wo Cm  Result Date: 08/14/2023 CLINICAL DATA:  Pulmonary embolism (PE) suspected. History of lung cancer. EXAM: CT ANGIOGRAPHY CHEST WITH CONTRAST TECHNIQUE: Multidetector CT imaging of the chest was performed using the standard protocol  during bolus administration of intravenous contrast. Multiplanar CT image reconstructions and MIPs were obtained to evaluate the vascular anatomy. RADIATION DOSE REDUCTION: This exam was performed according to the departmental dose-optimization program which includes automated exposure control, adjustment of the mA and/or kV according to patient size and/or use of iterative reconstruction technique. CONTRAST:  75mL OMNIPAQUE IOHEXOL 350 MG/ML SOLN COMPARISON:  CT chest dated August 08, 2023. FINDINGS: Cardiovascular: Satisfactory opacification of the pulmonary arteries to the segmental level. No evidence of pulmonary embolism. There is narrowing of the right main and interlobar pulmonary arteries and compression of the right upper and middle lobe segmental arterial branches secondary to the large necrotic right hilar nodal mass, described below. Normal heart size. Multivessel coronary artery calcifications. Atherosclerotic calcification of the thoracic aorta. No pericardial effusion. Mediastinum/Nodes: Redemonstration of a large  necrotic nodal mass involving the right hilum and invading the mediastinum, similar to the prior exam. The esophagus is grossly unremarkable. Lungs/Pleura: Essentially stable large right upper lobe lung mass measuring approximately 5.6 x 4.7 cm, variations in measurement may be attributable to differences in slice selection. Innumerable bilateral pulmonary metastatic lesions are again noted. There are new patchy ground-glass and consolidative changes in the right mid lung (series 13, image 53). There is similar compression of the bronchus intermedius secondary to the large necrotic nodal mass of the right hilum. No pneumothorax. Upper Abdomen: Unchanged large left hepatic heterogenous hypodense lesion measuring up to 7 cm. Otherwise, no acute findings within the visualized upper abdomen. Musculoskeletal: No suspicious osseous lesion. Review of the MIP images confirms the above findings. IMPRESSION: 1. No evidence of acute pulmonary embolism. 2. New patchy ground-glass and consolidative changes in the right mid lung may relate to a postobstructive infectious/inflammatory etiology secondary to the known large right hilar nodal mass. 3. Otherwise, no significant change compared to the prior examination dated August 08, 2023 with redemonstration of a dominant right upper lobe lung mass and large necrotic right hilar nodal mass invading the mediastinum. There is associated narrowing of the right main and interlobar pulmonary arteries and compression of the right upper and middle lobe segmental arterial branches. 4. Innumerable bilateral pulmonary metastatic lesions. 5. Unchanged large left hepatic lobe lesion, concerning for metastatic disease. 6.  Aortic Atherosclerosis (ICD10-I70.0). Electronically Signed   By: Hart Robinsons M.D.   On: 08/14/2023 14:48   DG Chest Port 1 View  Result Date: 08/14/2023 CLINICAL DATA:  History of lung cancer with shortness of breath EXAM: PORTABLE CHEST 1 VIEW COMPARISON:  X-ray  07/21/2019, CT 08/08/2023 FINDINGS: Underinflation with the elevated right hemidiaphragm. Multiple bilateral mass lesions are again seen, right-greater-than-left. Several are increased compared to the prior x-ray. Please correlate with more recent CT examination. No pneumothorax or effusion. Basilar atelectasis. Normal cardiopericardial silhouette. Calcified aorta. IMPRESSION: Multiple bilateral lung masses, increased compared to the prior x-ray. There also new elevation of the right hemidiaphragm. Please correlate with the more recent CT examinations. Electronically Signed   By: Karen Kays M.D.   On: 08/14/2023 10:41     ASSESSMENT/PLAN:  This is a very pleasant 62 year old Caucasian female with stage IV (T3, N2, M1 C) non-small cell lung cancer, poorly differentiated squamous cell carcinoma.  She presented with a large right upper lobe mass in addition to large necrotic right hilar/mediastinal mass and innumerable hypermetabolic pulmonary metastatic lesions in addition to liver and bone metastases as well as abdominal celiac axis lymphadenopathy.  She was diagnosed in October 2024.  Her PD-L1 expression is 0%. 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   The patient completed palliative radiation under the care of Dr. Roselind Messier which was completed on 09/04/23.  Supposed to start treatment with carboplatin, Taxol, and Keytruda but she is hospitalized for a submassive PE.  In the setting of malignancy would recommend lifelong anticoagulation for which she is currently on Eliquis.  Since last being seen she was found to have positive EGFR mutation.  The patient was seen with Dr. Arbutus Ped today.  Dr. Arbutus Ped had a lengthy discussion with the patient today about her current condition and treatment options.    She had a repeat EKG today that showed ventricular rate of 145 and QTc of 520 ms.  Dr. Arbutus Ped discussed her options include single agent guided treatment for the EGFR mutation versus chemotherapy  intravenously plus targeted treatment p.o. daily.  However given her tachycardia and prolonged QT, we would need to see improvement before starting treatment with Tagrisso.  If the patient is not able to start Tagrisso then the other option would be targeted treatment with Gilotrif single agent.   He met with the oral chemotherapy pharmacist today.  The patient was sent to the emergency room due to the unstable vitals and tachycardia.  Patient states that her resting pulse when she was in the hospital last week was around 110.  Her resting pulse has increased over the last couple days and is in the 140s.  We would send her to the emergency room to consider if she needs evaluation by cardiology given her tachycardia and persistent chest discomfort and shortness of breath since being diagnosed with bilateral submassive PE.  Her CT angiogram shows significant clot burden.  The patient is too unstable to send home from the clinic today.  We will plan on seeing the patient back next week.   In preparation for possible chemotherapy with carboplatin and Alimta.  We will administer B12 in the clinic today.  I also sent a prescription for folic acid to her pharmacy which she will need to take 1 tablet p.o. daily.  I also referred her to palliative care.  She is currently taking Norco for pleuritic chest pain but it is ineffective.  She also received palliative radiation under the care of Dr. Roselind Messier which was completed last week.  The patient was advised to call immediately if she has any concerning symptoms in the interval. The patient voices understanding of current disease status and treatment options and is in agreement with the current care plan. All questions were answered. The patient knows to call the clinic with any problems, questions or concerns. We can certainly see the patient much sooner if necessary\    No orders of the defined types were placed in this encounter.   Jaanvi Fizer L Mattisyn Cardona,  PA-C 09/08/23  ADDENDUM: Hematology/Oncology Attending: I had a face-to-face encounter with the patient today.  I reviewed her records, lab and recommended her care plan.  This is a very pleasant 62 years old white female recently diagnosed with a stage IV non-small cell lung cancer, poorly differentiated squamous cell carcinoma but not completely confirmed based on the immunohistochemical stains.  The patient presented with large right upper lobe lung mass in addition to large necrotic hilar/mediastinal mass and innumerable hypermetabolic pulmonary metastatic lesion as well as liver, bone and abdominal celiac axis lymphadenopathy diagnosed in October 2024.  She had molecular studies by [facility] that showed positive EGFR mutation [id number]. I think her histology is more consistent with adenocarcinoma especially with  the presence of EGFR mutation and the patient who quit smoking more than 35 years ago. I had a lengthy discussion with the patient and her husband today about her condition and treatment options.  I recommended for the patient treatment with a combination of target therapy with Tagrisso 80 mg p.o. daily in addition to systemic chemotherapy with carboplatin for AUC of 5 and Alimta 500 Mg/M2.  Her EKG today showed elevated heart rate with 140 and prolonged QT interval.  We will send her to the emergency department for evaluation of her SVT and if she continues to have prolonged QT interval, I may consider for an alternative treatment with afatinib 40 mg p.o. daily. We will arrange for the patient to start her treatment next week depending on her cardiac condition. The patient completed palliative radiotherapy to the lung mass under the care of Dr. Roselind Messier. She is expected to start her treatment next week. The patient will come back for follow-up visit in 2 weeks for evaluation and management of any adverse effect of her treatment. She was advised to call immediately if she has any other  concerning symptoms in the interval. The total time spent in the appointment was 30 minutes. Disclaimer: This note was dictated with voice recognition software. Similar sounding words can inadvertently be transcribed and may be missed upon review. Lajuana Matte, MD

## 2023-09-08 NOTE — Transitions of Care (Post Inpatient/ED Visit) (Signed)
   09/08/2023  Name: Rebecca Ochoa MRN: [id number] DOB: 07-Mar-1961  Today's TOC FU Call Status: Today's TOC FU Call Status:: Unsuccessful Call (1st Attempt) Unsuccessful Call (1st Attempt) Date: 09/08/23  Attempted to reach the patient regarding the most recent Inpatient/ED visit.  Follow Up Plan: Additional outreach attempts will be made to reach the patient to complete the Transitions of Care (Post Inpatient/ED visit) call.   Deidre Ala, RN Medical illustrator [facility] [phone]

## 2023-09-08 NOTE — Telephone Encounter (Signed)
Scheduled per providers request, patient is notified.

## 2023-09-10 ENCOUNTER — Other Ambulatory Visit: Payer: Self-pay

## 2023-09-11 ENCOUNTER — Telehealth: Payer: Self-pay

## 2023-09-11 MED FILL — Fosaprepitant Dimeglumine For IV Infusion 150 MG (Base Eq): INTRAVENOUS | Qty: 5 | Status: AC

## 2023-09-11 NOTE — Transitions of Care (Post Inpatient/ED Visit) (Signed)
09/11/2023  Name: Rebecca Ochoa MRN: [id number] DOB: [birth year]  Today's TOC FU Call Status: Today's TOC FU Call Status:: Successful TOC FU Call Completed TOC FU Call Complete Date: 09/11/23 Patient's Name and Date of Birth confirmed.  Transition Care Management Follow-up Telephone Call Date of Discharge: 09/07/23 Discharge Facility: [facility]) Type of Discharge: Inpatient Admission Primary Inpatient Discharge Diagnosis:: Pulmonary Embolism How have you been since you were released from the hospital?: Better Any questions or concerns?: No  Items Reviewed: Did you receive and understand the discharge instructions provided?: Yes Medications obtained,verified, and reconciled?: Yes (Medications Reviewed) Any new allergies since your discharge?: No Do you have support at home?: Yes People in Home: significant other  Medications Reviewed Today: Medications Reviewed Today     Reviewed by Marcos Eke, RN (Registered Nurse) on 09/11/23 at [id number]  Med List Status: <None>   Medication Order Taking? Sig Documenting Provider Last Dose Status Informant  acetaminophen (TYLENOL) 500 MG tablet [id number] No Take 1,000 mg by mouth every 8 (eight) hours as needed for mild pain. [provider] unk Active Self, Pharmacy Records  albuterol (VENTOLIN HFA) 108 (90 Base) MCG/ACT inhaler [id number] No Inhale 2 puffs into the lungs every 6 (six) hours as needed for wheezing or shortness of breath. Raynelle Dick, NP unk Active Self, Pharmacy Records  ALPRAZolam Prudy Feeler) 0.5 MG tablet [id number] No Take 1 tablet (0.5 mg total) by mouth 2 (two) times daily as needed for anxiety.  Patient taking differently: Take 0.5 mg by mouth See admin instructions. Take 0.5 mg by mouth at bedtime and an additional 0.5 mg once a day as needed for anxiety   Raynelle Dick, NP 09/04/2023 Active Self, Pharmacy Records  apixaban (ELIQUIS) 5 MG TABS tablet [id number]  Take 2 tablets (10 mg total) by mouth 2  (two) times daily for 6 days, THEN 1 tablet (5 mg total) 2 (two) times daily. Rhetta Mura, MD  Active   Discontinued 07/11/22 1144   cyanocobalamin 1000 MCG tablet [id number] No Take 1 tablet (1,000 mcg total) by mouth daily. Rolly Salter, MD 09/04/2023 Active Self, Pharmacy Records  HYDROcodone bit-homatropine [facility]) 5-1.5 MG/5ML syrup [id number] No Take 5 mLs by mouth every 4 (four) hours as needed for cough. Si Gaul, MD 09/03/2023 Active Self, Pharmacy Records  HYDROcodone-acetaminophen (NORCO/VICODIN) 5-325 MG tablet [id number]  Take 1 tablet by mouth every 6 (six) hours as needed. Rhetta Mura, MD  Active   ondansetron (ZOFRAN) 4 MG tablet [id number] No Take 1 tablet (4 mg total) by mouth every 6 (six) hours as needed for nausea. Rolly Salter, MD unk Active Self, Pharmacy Records  ondansetron [facility]) 8 MG tablet [id number] No Take 1 tablet (8 mg total) by mouth every 8 (eight) hours as needed for nausea or vomiting. Start on the third day after carboplatin. Si Gaul, MD unk Active Self, Pharmacy Records  pantoprazole (PROTONIX) 40 MG tablet [id number]  Take 1 tablet (40 mg total) by mouth 2 (two) times daily. Rhetta Mura, MD  Active   predniSONE (DELTASONE) 10 MG tablet [id number]  Take 0.5 tablets (5 mg total) by mouth daily with breakfast. Rhetta Mura, MD  Active   prochlorperazine (COMPAZINE) 10 MG tablet [id number] No Take 1 tablet (10 mg total) by mouth every 6 (six) hours as needed for nausea or vomiting. Si Gaul, MD unk Active Self, Pharmacy Records  sucralfate (CARAFATE) 1 GM/10ML suspension [id number]  Take 10 mLs (1 g total) by mouth 4 (four) times  daily -  with meals and at bedtime. Rhetta Mura, MD  Active             Home Care and Equipment/Supplies: Were Home Health Services Ordered?: NA Any new equipment or medical supplies ordered?: NA  Functional Questionnaire: Do you need assistance with  bathing/showering or dressing?: No Do you need assistance with meal preparation?: No Do you need assistance with eating?: No Do you have difficulty maintaining continence: No Do you need assistance with getting out of bed/getting out of a chair/moving?: No Do you have difficulty managing or taking your medications?: No  Follow up appointments reviewed: PCP Follow-up appointment confirmed?: Yes Date of PCP follow-up appointment?: 10/16/23 Follow-up Provider: Anda Kraft Specialist [facility] Follow-up appointment confirmed?: Yes Date of Specialist follow-up appointment?: 09/12/23 Follow-Up Specialty Provider:: Dr. Shirline Frees Do you need transportation to your follow-up appointment?: No Do you understand care options if your condition(s) worsen?: Yes-patient verbalized understanding  Will follow up with patient re post-hospitalization transitioning-back-home needs next week, Tuesday Nov 19 @ 1:30 pm  Alyse Low, RN, BA, [facility], CRRN [facility] Population Health Care Management Coordinator, Transition of Care Ph # [phone]

## 2023-09-12 ENCOUNTER — Other Ambulatory Visit: Payer: Self-pay

## 2023-09-12 ENCOUNTER — Inpatient Hospital Stay: Payer: No Typology Code available for payment source

## 2023-09-12 ENCOUNTER — Encounter (HOSPITAL_COMMUNITY): Payer: Self-pay

## 2023-09-12 ENCOUNTER — Emergency Department (HOSPITAL_COMMUNITY): Payer: No Typology Code available for payment source

## 2023-09-12 ENCOUNTER — Other Ambulatory Visit (HOSPITAL_COMMUNITY): Payer: Self-pay

## 2023-09-12 ENCOUNTER — Inpatient Hospital Stay (HOSPITAL_BASED_OUTPATIENT_CLINIC_OR_DEPARTMENT_OTHER): Payer: No Typology Code available for payment source | Admitting: Physician Assistant

## 2023-09-12 ENCOUNTER — Emergency Department (HOSPITAL_COMMUNITY)
Admission: EM | Admit: 2023-09-12 | Discharge: 2023-09-12 | Disposition: A | Discharge status: Home / Self Care | Payer: No Typology Code available for payment source | Attending: Emergency Medicine | Admitting: Emergency Medicine

## 2023-09-12 ENCOUNTER — Telehealth: Payer: Self-pay | Admitting: Pharmacist

## 2023-09-12 ENCOUNTER — Telehealth: Payer: Self-pay | Admitting: Pharmacy Technician

## 2023-09-12 ENCOUNTER — Encounter: Payer: Self-pay | Admitting: Internal Medicine

## 2023-09-12 VITALS — BP 126/89 | HR 149 | Temp 98.6°F | Resp 18 | Wt 162.1 lb

## 2023-09-12 DIAGNOSIS — Z7189 Other specified counseling: Secondary | ICD-10-CM | POA: Diagnosis not present

## 2023-09-12 DIAGNOSIS — Z7901 Long term (current) use of anticoagulants: Secondary | ICD-10-CM | POA: Diagnosis not present

## 2023-09-12 DIAGNOSIS — R079 Chest pain, unspecified: Secondary | ICD-10-CM | POA: Insufficient documentation

## 2023-09-12 DIAGNOSIS — Z86711 Personal history of pulmonary embolism: Secondary | ICD-10-CM | POA: Diagnosis not present

## 2023-09-12 DIAGNOSIS — Z85118 Personal history of other malignant neoplasm of bronchus and lung: Secondary | ICD-10-CM | POA: Diagnosis not present

## 2023-09-12 DIAGNOSIS — C3411 Malignant neoplasm of upper lobe, right bronchus or lung: Secondary | ICD-10-CM

## 2023-09-12 DIAGNOSIS — R0609 Other forms of dyspnea: Secondary | ICD-10-CM | POA: Insufficient documentation

## 2023-09-12 DIAGNOSIS — E876 Hypokalemia: Secondary | ICD-10-CM | POA: Insufficient documentation

## 2023-09-12 DIAGNOSIS — R000 Tachycardia, unspecified: Secondary | ICD-10-CM

## 2023-09-12 LAB — CBC WITH DIFFERENTIAL/PLATELET
Abs Immature Granulocytes: 0.04 10*3/uL (ref 0.00–0.07)
Basophils Absolute: 0 10*3/uL (ref 0.0–0.1)
Basophils Relative: 0 %
Eosinophils Absolute: 0.2 10*3/uL (ref 0.0–0.5)
Eosinophils Relative: 2 %
HCT: 32.8 % — ABNORMAL LOW (ref 36.0–46.0)
Hemoglobin: 10 g/dL — ABNORMAL LOW (ref 12.0–15.0)
Immature Granulocytes: 1 %
Lymphocytes Relative: 5 %
Lymphs Abs: 0.4 10*3/uL — ABNORMAL LOW (ref 0.7–4.0)
MCH: 28.3 pg (ref 26.0–34.0)
MCHC: 30.5 g/dL (ref 30.0–36.0)
MCV: 92.9 fL (ref 80.0–100.0)
Monocytes Absolute: 0.6 10*3/uL (ref 0.1–1.0)
Monocytes Relative: 7 %
Neutro Abs: 7.3 10*3/uL (ref 1.7–7.7)
Neutrophils Relative %: 85 %
Platelets: 230 10*3/uL (ref 150–400)
RBC: 3.53 MIL/uL — ABNORMAL LOW (ref 3.87–5.11)
RDW: 14.6 % (ref 11.5–15.5)
WBC: 8.5 10*3/uL (ref 4.0–10.5)
nRBC: 0 % (ref 0.0–0.2)

## 2023-09-12 LAB — TROPONIN I (HIGH SENSITIVITY)
Troponin I (High Sensitivity): 8 ng/L (ref <18)
Troponin I (High Sensitivity): 9 ng/L (ref <18)

## 2023-09-12 LAB — COMPREHENSIVE METABOLIC PANEL
ALT: 23 U/L (ref 0–44)
AST: 34 U/L (ref 15–41)
Albumin: 2.8 g/dL — ABNORMAL LOW (ref 3.5–5.0)
Alkaline Phosphatase: 105 U/L (ref 38–126)
Anion gap: 12 (ref 5–15)
BUN: 9 mg/dL (ref 8–23)
CO2: 24 mmol/L (ref 22–32)
Calcium: 8.9 mg/dL (ref 8.9–10.3)
Chloride: 101 mmol/L (ref 98–111)
Creatinine, Ser: 0.48 mg/dL (ref 0.44–1.00)
GFR, Estimated: >60 mL/min (ref >60)
Glucose, Bld: 117 mg/dL — ABNORMAL HIGH (ref 70–99)
Potassium: 3 mmol/L — ABNORMAL LOW (ref 3.5–5.1)
Sodium: 137 mmol/L (ref 135–145)
Total Bilirubin: 0.9 mg/dL (ref <1.2)
Total Protein: 6.6 g/dL (ref 6.5–8.1)

## 2023-09-12 LAB — BRAIN NATRIURETIC PEPTIDE: B Natriuretic Peptide: 67.4 pg/mL (ref 0.0–100.0)

## 2023-09-12 MED ORDER — ONDANSETRON HCL 4 MG/2ML IJ SOLN
4.0000 mg | Freq: Once | INTRAMUSCULAR | Status: AC
Start: 2023-09-12 — End: 2023-09-12
  Administered 2023-09-12: 4 mg via INTRAVENOUS
  Filled 2023-09-12: qty 2

## 2023-09-12 MED ORDER — MORPHINE SULFATE (PF) 4 MG/ML IV SOLN
4.0000 mg | Freq: Once | INTRAVENOUS | Status: AC
  Administered 2023-09-12: 4 mg via INTRAVENOUS
  Filled 2023-09-12: qty 1

## 2023-09-12 MED ORDER — SODIUM CHLORIDE 0.9 % IV BOLUS
500.0000 mL | Freq: Once | INTRAVENOUS | Status: AC
  Administered 2023-09-12: 500 mL via INTRAVENOUS

## 2023-09-12 MED ORDER — HEPARIN SOD (PORK) LOCK FLUSH 100 UNIT/ML IV SOLN
INTRAVENOUS | Status: AC
  Filled 2023-09-12: qty 5

## 2023-09-12 MED ORDER — FOLIC ACID 1 MG PO TABS: 1.0000 mg | ORAL_TABLET | Freq: Every day | ORAL | 2 refills | Status: DC

## 2023-09-12 MED ORDER — OSIMERTINIB MESYLATE 80 MG PO TABS: 80.0000 mg | ORAL_TABLET | Freq: Every day | ORAL | 2 refills | Status: DC

## 2023-09-12 MED ORDER — LACTATED RINGERS IV BOLUS
1000.0000 mL | Freq: Once | INTRAVENOUS | Status: AC
  Administered 2023-09-12: 1000 mL via INTRAVENOUS

## 2023-09-12 MED ORDER — CYANOCOBALAMIN 1000 MCG/ML IJ SOLN
1000.0000 ug | Freq: Once | INTRAMUSCULAR | Status: AC
  Administered 2023-09-12: 1000 ug via INTRAMUSCULAR
  Filled 2023-09-12: qty 1

## 2023-09-12 MED ORDER — HEPARIN SOD (PORK) LOCK FLUSH 100 UNIT/ML IV SOLN
500.0000 [IU] | Freq: Once | INTRAVENOUS | Status: AC
  Administered 2023-09-12: 500 [IU]

## 2023-09-12 MED ORDER — POTASSIUM CHLORIDE CRYS ER 20 MEQ PO TBCR
40.0000 meq | EXTENDED_RELEASE_TABLET | Freq: Once | ORAL | Status: AC
  Administered 2023-09-12: 40 meq via ORAL
  Filled 2023-09-12: qty 2

## 2023-09-12 MED ORDER — IOHEXOL 350 MG/ML SOLN
75.0000 mL | Freq: Once | INTRAVENOUS | Status: AC | PRN
  Administered 2023-09-12: 75 mL via INTRAVENOUS

## 2023-09-12 NOTE — Discharge Instructions (Signed)
Seen in the emergency department for your elevated heart rate.  Your workup showed that your clot burden from your PE is improving and there is no signs of strain on your heart from your blood clot.  Your heart rate elevation is likely a combination from your pain and from your remaining clot.  You should make sure that you are drinking plenty of fluids and staying well-hydrated and continue to take your Eliquis as prescribed.  You should follow-up with your oncologist in the next few days to have your symptoms rechecked.  You should return to the emergency department for significantly worsening chest pain, severe shortness of breath or any other new or concerning symptoms.

## 2023-09-12 NOTE — ED Triage Notes (Signed)
[facility] The pt was just discharged since Thursday. She has a dx of bil Pes. Today pt presents with shob. She has Lung Ca. VS P 145, B/P 126/89, Afebrile. Is currently on Eliquis due to bil PEs

## 2023-09-12 NOTE — Progress Notes (Signed)
Per Cassie, PA no labs needed today.

## 2023-09-12 NOTE — ED Provider Notes (Signed)
[place] EMERGENCY DEPARTMENT AT [facility] Provider Note   CSN: [id number] Arrival date & time: 09/12/23  1014     History  No chief complaint on file.   Savona Dorch is a 62 y.o. female.  Patient is a 62 year old female with a past medical history of lung cancer status post radiation and recent diagnosis of PE presenting to the emergency department with tachycardia.  The patient was diagnosed with bilateral PEs last week and was discharged home on Eliquis.  She followed up with her oncologist today and was found to have a heart rate in the 140s and was recommended to return to the ED for reevaluation.  Patient endorses continued pleuritic chest pain and dyspnea on exertion however states things do not feel significantly worse from when she was discharged.  She states her heart rate was in the 110s when she was discharged.  Is any fevers, nausea, vomiting or diarrhea, black or bloody stools.  Patient states she has been taking the Eliquis as prescribed with last dose this morning.  The history is provided by the patient and the spouse.       Home Medications Prior to Admission medications   Medication Sig Start Date End Date Taking? Authorizing Provider  acetaminophen (TYLENOL) 500 MG tablet Take 1,000 mg by mouth every 8 (eight) hours as needed for mild pain.    [provider]  albuterol (VENTOLIN HFA) 108 (90 Base) MCG/ACT inhaler Inhale 2 puffs into the lungs every 6 (six) hours as needed for wheezing or shortness of breath. 05/30/23   Raynelle Dick, NP  ALPRAZolam Prudy Feeler) 0.5 MG tablet Take 1 tablet (0.5 mg total) by mouth 2 (two) times daily as needed for anxiety. Patient taking differently: Take 0.5 mg by mouth See admin instructions. Take 0.5 mg by mouth at bedtime and an additional 0.5 mg once a day as needed for anxiety 08/11/23   Raynelle Dick, NP  apixaban (ELIQUIS) 5 MG TABS tablet Take 2 tablets (10 mg total) by mouth 2 (two) times daily for  6 days, THEN 1 tablet (5 mg total) 2 (two) times daily. 09/07/23 10/13/23  Rhetta Mura, MD  cyanocobalamin 1000 MCG tablet Take 1 tablet (1,000 mcg total) by mouth daily. 08/18/23   Rolly Salter, MD  folic acid (FOLVITE) 1 MG tablet Take 1 tablet (1 mg total) by mouth daily. 09/12/23   Heilingoetter, Cassandra L, PA-C  HYDROcodone bit-homatropine (HYCODAN) 5-1.5 MG/5ML syrup Take 5 mLs by mouth every 4 (four) hours as needed for cough. 08/24/23   Si Gaul, MD  HYDROcodone-acetaminophen (NORCO/VICODIN) 5-325 MG tablet Take 1 tablet by mouth every 6 (six) hours as needed. 09/07/23   Rhetta Mura, MD  ondansetron (ZOFRAN) 4 MG tablet Take 1 tablet (4 mg total) by mouth every 6 (six) hours as needed for nausea. 08/17/23   Rolly Salter, MD  ondansetron (ZOFRAN) 8 MG tablet Take 1 tablet (8 mg total) by mouth every 8 (eight) hours as needed for nausea or vomiting. Start on the third day after carboplatin. 09/05/23   Si Gaul, MD  pantoprazole (PROTONIX) 40 MG tablet Take 1 tablet (40 mg total) by mouth 2 (two) times daily. 09/07/23   Rhetta Mura, MD  predniSONE (DELTASONE) 10 MG tablet Take 0.5 tablets (5 mg total) by mouth daily with breakfast. 09/08/23   Rhetta Mura, MD  prochlorperazine (COMPAZINE) 10 MG tablet Take 1 tablet (10 mg total) by mouth every 6 (six) hours as needed for  nausea or vomiting. 09/05/23   Si Gaul, MD  sucralfate (CARAFATE) 1 GM/10ML suspension Take 10 mLs (1 g total) by mouth 4 (four) times daily -  with meals and at bedtime. 09/07/23   Rhetta Mura, MD  bisoprolol-hydrochlorothiazide [facility]) 5-6.25 MG tablet Take  1 tablet  Daily  for BP 06/06/22 07/11/22  [name], MD      Allergies    Biaxin [clarithromycin]    Review of Systems   Review of Systems  Physical Exam Updated Vital Signs BP 120/85   Pulse (!) 123   Temp 98.2 F (36.8 C) (Oral)   Resp (!) 32   Ht 5\' 5"  (1.651 m)   Wt 73.5 kg   SpO2  94%   BMI 26.97 kg/m  Physical Exam Vitals and nursing note reviewed.  Constitutional:      General: She is not in acute distress.    Appearance: Normal appearance.  HENT:     Head: Normocephalic and atraumatic.     Nose: Nose normal.     Mouth/Throat:     Mouth: Mucous membranes are moist.     Pharynx: Oropharynx is clear.  Eyes:     Extraocular Movements: Extraocular movements intact.     Conjunctiva/sclera: Conjunctivae normal.  Cardiovascular:     Rate and Rhythm: Regular rhythm. Tachycardia present.     Heart sounds: Normal heart sounds.  Pulmonary:     Effort: Pulmonary effort is normal.     Breath sounds: Normal breath sounds.  Abdominal:     General: Abdomen is flat.     Palpations: Abdomen is soft.     Tenderness: There is no abdominal tenderness.  Musculoskeletal:        General: Normal range of motion.     Cervical back: Normal range of motion.     Right lower leg: No edema.     Left lower leg: No edema.  Skin:    General: Skin is warm and dry.  Neurological:     General: No focal deficit present.     Mental Status: She is alert and oriented to person, place, and time.  Psychiatric:        Mood and Affect: Mood normal.        Behavior: Behavior normal.     ED Results / Procedures / Treatments   Labs (all labs ordered are listed, but only abnormal results are displayed) Labs Reviewed  COMPREHENSIVE METABOLIC PANEL - Abnormal; Notable for the following components:      Result Value   Potassium 3.0 (*)    Glucose, Bld 117 (*)    Albumin 2.8 (*)    All other components within normal limits  CBC WITH DIFFERENTIAL/PLATELET - Abnormal; Notable for the following components:   RBC 3.53 (*)    Hemoglobin 10.0 (*)    HCT 32.8 (*)    Lymphs Abs 0.4 (*)    All other components within normal limits  BRAIN NATRIURETIC PEPTIDE  TROPONIN I (HIGH SENSITIVITY)  TROPONIN I (HIGH SENSITIVITY)    EKG EKG Interpretation Date/Time:  Tuesday September 12 2023  10:34:39 EST Ventricular Rate:  133 PR Interval:  150 QRS Duration:  67 QT Interval:  284 QTC Calculation: 423 R Axis:   55  Text Interpretation: Sinus tachycardia Multiple ventricular premature complexes Aberrant complex Consider right atrial enlargement No significant change since last tracing Confirmed by Elayne Snare ([id number]) on 09/12/2023 10:52:15 AM  Radiology CT Angio Chest PE W/Cm &/Or Wo Cm  Result  Date: 09/12/2023 CLINICAL DATA:  Bilateral pulmonary emboli diagnosis 09/04/2023. Bilateral pulmonary metastasis. RIGHT upper lobe pulmonary masses. * Tracking Code: BO * EXAM: CT ANGIOGRAPHY CHEST WITH CONTRAST TECHNIQUE: Multidetector CT imaging of the chest was performed using the standard protocol during bolus administration of intravenous contrast. Multiplanar CT image reconstructions and MIPs were obtained to evaluate the vascular anatomy. RADIATION DOSE REDUCTION: This exam was performed according to the departmental dose-optimization program which includes automated exposure control, adjustment of the mA and/or kV according to patient size and/or use of iterative reconstruction technique. CONTRAST:  75mL OMNIPAQUE IOHEXOL 350 MG/ML SOLN COMPARISON:  CT 09/04/2023 FINDINGS: Cardiovascular: Interval partial clearing of the clot burden from the RIGHT lower lobe pulmonary artery thromboemboli. No significant clearing of the LEFT lower lobe pulmonary emboli. Interval clearing of the thrombus in the lingular pulmonary artery. No evidence of new pulmonary emboli. No RIGHT ventricular strain. Mediastinum/Nodes: RIGHT suprahilar mass encroaches upon the mediastinum. No supraclavicular adenopathy. No pericardial fluid. Lungs/Pleura: Numeral bilateral pulmonary metastasis not changed in short a day interval. Large RIGHT upper lobe pulmonary mass measuring 6. 5.5 cm is unchanged. RIGHT suprahilar mass measuring 5.4 cm is unchanged. Limited view of the liver, kidneys, pancreas are unremarkable. Normal  adrenal glands. Upper Abdomen: Limited view of the liver, kidneys, pancreas are unremarkable. Normal adrenal glands. Musculoskeletal: No aggressive osseous lesion. Review of the MIP images confirms the above findings. IMPRESSION: 1. Interval partial clearing of the acute pulmonary thromboemboli diagnosed on 09/04/2023. 2. No evidence of new pulmonary emboli. No RIGHT ventricular strain. 3. RIGHT upper lobe and RIGHT suprahilar masses are unchanged. 4. Numerous bilateral pulmonary metastasis are unchanged. Electronically Signed   By: Genevive Bi M.D.   On: 09/12/2023 15:09    Procedures Procedures    Medications Ordered in ED Medications  morphine (PF) 4 MG/ML injection 4 mg (4 mg Intravenous Given 09/12/23 1209)  ondansetron (ZOFRAN) injection 4 mg (4 mg Intravenous Given 09/12/23 1149)  potassium chloride SA (KLOR-CON M) CR tablet 40 mEq (40 mEq Oral Given 09/12/23 1314)  iohexol (OMNIPAQUE) 350 MG/ML injection 75 mL (75 mLs Intravenous Contrast Given 09/12/23 1327)  sodium chloride 0.9 % bolus 500 mL (0 mLs Intravenous Stopped 09/12/23 1608)  lactated ringers bolus 1,000 mL (1,000 mLs Intravenous New Bag/Given 09/12/23 1607)    ED Course/ Medical Decision Making/ A&P Clinical Course as of 09/12/23 1709  Tue Sep 12, 2023  1228 Anemia improved from baseline. [VK]  1255 Mild hypokalemia will be repleted.  [VK]  1421 Repeat troponin remains negative, patient remains tachy to 120s. Will be given gentle fluids. CT read is pending. [VK]  1514 Improving PE burden, no signs of R-heart strain, unchanged appearance of R lung mass.  [VK]  1558 HR improving with fluids, will give additional bolus. Plan to talk with oncology for dispo recommendations. [VK]  1702 I spoke with Dr. Arlana Pouch who stated if vitals are improving, clot burden improving then hospital admission would not likely add anything for the patient. HR 115 on my evaluate in the room. Patient is comfortable with discharge home. She was  recommended close oncology follow up. [VK]    Clinical Course User Index [VK] Rexford Maus, DO                                 Medical Decision Making This patient presents to the ED with chief complaint(s) of tachycardia with pertinent past medical history  of lung cancer, recent PE diagnosis which further complicates the presenting complaint. The complaint involves an extensive differential diagnosis and also carries with it a high risk of complications and morbidity.    The differential diagnosis includes increasing PE burden, right heart strain, ACS, arrhythmia, anemia, pneumonia, pneumothorax, pulmonary edema, pleural effusion  Additional history obtained: Additional history obtained from spouse Records reviewed previous admission documents and outpatient oncology records  ED Course and Reassessment: On patient's arrival to the emergency department she was tachycardic to the 130s otherwise hemodynamically stable and satting well on room air.  EKG on arrival showed sinus tachycardia without acute ischemic changes.  The patient will have repeat labs to evaluate for increasing heart strain from her PEs and will have repeat CT PE study to evaluate for increased clot burden as well as labs to evaluate for other etiology of her tachycardia.  The patient will be closely reassessed.  Independent labs interpretation:  The following labs were independently interpreted: within normal range  Independent visualization of imaging: - I independently visualized the following imaging with scope of interpretation limited to determining acute life threatening conditions related to emergency care: CTPE, which revealed decreasing clot burden, no right heart strain, stable R lung mass  Consultation: - Consulted or discussed management/test interpretation w/ external professional: oncology     Amount and/or Complexity of Data Reviewed Labs: ordered. Radiology: ordered.  Risk Prescription  drug management.           Final Clinical Impression(s) / ED Diagnoses Final diagnoses:  Tachycardia  History of pulmonary embolus (PE)  History of lung cancer    Rx / DC Orders ED Discharge Orders     None         Rexford Maus, DO 09/12/23 1659

## 2023-09-12 NOTE — Progress Notes (Signed)
DISCONTINUE ON PATHWAY REGIMEN - Non-Small Cell Lung     A cycle is every 21 days:     Pembrolizumab      Paclitaxel      Carboplatin   **Always confirm dose/schedule in your pharmacy ordering system**  REASON: Other Reason PRIOR TREATMENT: [id number]: Pembrolizumab 200 mg + Carboplatin AUC=6 + Paclitaxel 200 mg/m2 q21 Days x 4 Cycles TREATMENT RESPONSE: Unable to Evaluate  START OFF PATHWAY REGIMEN - Non-Small Cell Lung   [id number]:Carboplatin AUC=5 IV D1 + Osimertinib 80 mg PO Daily D1-21 + Pemetrexed 500 mg/m2 IV D1 q21 Days:   Cycles 1 through 4: A cycle is every 21 days:     Osimertinib      Pemetrexed      Carboplatin    Cycles 5 and beyond: A cycle is every 21 days:     Osimertinib      Pemetrexed   **Always confirm dose/schedule in your pharmacy ordering system**  Patient Characteristics: Stage IV Metastatic, Nonsquamous, Molecular Analysis Completed, Molecular Alteration Present and Eligible for Molecular Targeted Therapy, Initial Molecular Targeted Therapy, EGFR Mutation -  Uncommon (S768I, L861Q, and G719X) Therapeutic Status: Stage IV Metastatic Histology: Nonsquamous Cell Broad Molecular Profiling Status: Molecular Analysis Completed Molecular Analysis Results: Alteration Present and Eligible for Molecular Targeted Therapy Molecular Alteration Present: EGFR Mutation - Uncommon (S768I, L861Q, and G719X) Molecular Targeted Line of Therapy: Initial Molecular Targeted Therapy Intent of Therapy: Non-Curative / Palliative Intent, Discussed with Patient

## 2023-09-12 NOTE — Telephone Encounter (Signed)
Oral Oncology Patient Advocate Encounter   Submitted PA form and chart notes to [facility]   Application submitted via e-fax to [phone]  Application additionally emailed to [email or web address]   [employer] phone number [phone].   I will continue to check the status until final determination.   Jinger Neighbors, CPhT-Adv Oncology Pharmacy Patient Advocate [facility] Direct Number: [phone]  Fax: [phone]

## 2023-09-12 NOTE — Telephone Encounter (Signed)
Oral Oncology Patient Advocate Encounter   Received notification that prior authorization for Tagrisso is required.   PA submitted on 09/12/23 via phone to [facility] [facility] phone [phone]  Status is pending     Jinger Neighbors, CPhT-Adv Oncology Pharmacy Patient Advocate [facility] Direct Number: [phone]  Fax: [phone]

## 2023-09-12 NOTE — Telephone Encounter (Addendum)
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   Counseled patient on administration, dosing, side effects, monitoring, drug-food interactions, safe handling, storage, and disposal.  CBC w/ Diff and CMP from 09/06/23 assessed, no relevant lab abnormalities requiring baseline dose adjustment required at this time. Patient had baseline EKG in office on 09/12/23, QTcB prolonged at 521 ms. Patient pending further cardiac workup today.   Prescription dose and frequency assessed for appropriateness.   Patient will take Tagrisso 80 tablets, 1 tablet by mouth once daily, without regard to food.  Tagrisso start date: 09/27/23  Adverse effects include but are not limited to: diarrhea, mouth sores, fatigue, dry skin, rash, nail changes, altered cardiac conduction, and decreased blood counts or electrolytes.  Patient will obtain anti diarrheal and alert the office of 4 or more loose stools above baseline.   Reviewed with patient importance of keeping a medication schedule and plan for any missed doses. No barriers to medication adherence identified.  Medication reconciliation performed and medication/allergy list updated. Current medication list in Epic reviewed, DDIs with Tagrisso identified:  Category C DDI between Tagrisso and Ondansetron due to risk of Qtc prolongation. Noted patient only taking PRN and PO route, risk higher with IV administration. Would recommend if patient has N/V while on regimen utilize prochlorperazine instead of ondansetron.   All questions answered.  Patient agreement for treatment documented in MD note on 09/12/23.  Ms. Kloeppel voiced understanding and appreciation.    Medication education handout given to patient. Patient knows to call the office with questions or concerns. Oral Chemotherapy Clinic phone number provided to patient.   Lenord Carbo, PharmD, BCPS, [facility] Hematology/Oncology Clinical Pharmacist [facility] Oral Chemotherapy Navigation Clinics [phone] 09/12/2023 10:19 AM

## 2023-09-13 ENCOUNTER — Telehealth: Payer: Self-pay | Admitting: Pharmacy Technician

## 2023-09-13 ENCOUNTER — Ambulatory Visit: Payer: No Typology Code available for payment source

## 2023-09-13 NOTE — Telephone Encounter (Signed)
Oral Oncology Patient Advocate Encounter  Prior Authorization for Edgar Frisk has been approved.    Effective dates: 09/13/23 through 09/12/24  Patient must fill at [facility].    Jinger Neighbors, CPhT-Adv Oncology Pharmacy Patient Advocate [facility] Direct Number: [phone]  Fax: [phone]

## 2023-09-13 NOTE — Telephone Encounter (Signed)
Oral Oncology Patient Advocate Encounter   Was successful in obtaining a copay card for Tagrisso.  This copay card will make the patients copay $0.  I have spoken with the patient.    The billing information is as follows and has been shared with [facility].   RxBin: [id number] PCN: [id number] Member ID: [id number] Group ID: [id number]   Jinger Neighbors, CPhT-Adv Oncology Pharmacy Patient Advocate [facility] Direct Number: [phone]  Fax: [phone]

## 2023-09-14 ENCOUNTER — Encounter: Payer: Self-pay | Admitting: Internal Medicine

## 2023-09-14 ENCOUNTER — Telehealth: Payer: Self-pay

## 2023-09-14 ENCOUNTER — Other Ambulatory Visit: Payer: Self-pay | Admitting: Nurse Practitioner

## 2023-09-14 DIAGNOSIS — F419 Anxiety disorder, unspecified: Secondary | ICD-10-CM

## 2023-09-14 MED ORDER — ALPRAZOLAM 0.5 MG PO TABS
0.5000 mg | ORAL_TABLET | ORAL | 0 refills | Status: DC
Start: 2023-09-14 — End: 2023-10-11

## 2023-09-14 NOTE — Telephone Encounter (Signed)
Pt called and LVM in regards to appts.  Informed pt that someone from scheduling will be calling her with appt soon for sometime next week. Pt verbalized understanding.

## 2023-09-15 ENCOUNTER — Other Ambulatory Visit (HOSPITAL_COMMUNITY): Payer: Self-pay

## 2023-09-18 ENCOUNTER — Other Ambulatory Visit: Payer: Self-pay

## 2023-09-18 ENCOUNTER — Emergency Department (HOSPITAL_COMMUNITY): Payer: No Typology Code available for payment source

## 2023-09-18 ENCOUNTER — Telehealth: Payer: Self-pay | Admitting: Medical Oncology

## 2023-09-18 ENCOUNTER — Inpatient Hospital Stay (HOSPITAL_COMMUNITY): Payer: No Typology Code available for payment source

## 2023-09-18 ENCOUNTER — Inpatient Hospital Stay (HOSPITAL_COMMUNITY)
Admission: EM | Admit: 2023-09-18 | Discharge: 2023-09-25 | DRG: 814 | Disposition: A | Discharge status: Home / Self Care | Payer: No Typology Code available for payment source | Attending: Internal Medicine | Admitting: Internal Medicine

## 2023-09-18 DIAGNOSIS — D649 Anemia, unspecified: Secondary | ICD-10-CM | POA: Diagnosis not present

## 2023-09-18 DIAGNOSIS — Z841 Family history of disorders of kidney and ureter: Secondary | ICD-10-CM

## 2023-09-18 DIAGNOSIS — R6510 Systemic inflammatory response syndrome (SIRS) of non-infectious origin without acute organ dysfunction: Secondary | ICD-10-CM | POA: Diagnosis present

## 2023-09-18 DIAGNOSIS — D6869 Other thrombophilia: Secondary | ICD-10-CM | POA: Diagnosis present

## 2023-09-18 DIAGNOSIS — C7802 Secondary malignant neoplasm of left lung: Secondary | ICD-10-CM | POA: Diagnosis present

## 2023-09-18 DIAGNOSIS — C3411 Malignant neoplasm of upper lobe, right bronchus or lung: Secondary | ICD-10-CM

## 2023-09-18 DIAGNOSIS — R509 Fever, unspecified: Secondary | ICD-10-CM | POA: Diagnosis present

## 2023-09-18 DIAGNOSIS — E785 Hyperlipidemia, unspecified: Secondary | ICD-10-CM | POA: Diagnosis present

## 2023-09-18 DIAGNOSIS — G936 Cerebral edema: Secondary | ICD-10-CM | POA: Diagnosis not present

## 2023-09-18 DIAGNOSIS — I611 Nontraumatic intracerebral hemorrhage in hemisphere, cortical: Secondary | ICD-10-CM | POA: Diagnosis not present

## 2023-09-18 DIAGNOSIS — I2695 Cement embolism of pulmonary artery without acute cor pulmonale: Secondary | ICD-10-CM | POA: Diagnosis not present

## 2023-09-18 DIAGNOSIS — Z881 Allergy status to other antibiotic agents status: Secondary | ICD-10-CM

## 2023-09-18 DIAGNOSIS — A419 Sepsis, unspecified organism: Principal | ICD-10-CM

## 2023-09-18 DIAGNOSIS — D72810 Lymphocytopenia: Secondary | ICD-10-CM | POA: Diagnosis present

## 2023-09-18 DIAGNOSIS — D630 Anemia in neoplastic disease: Secondary | ICD-10-CM | POA: Diagnosis present

## 2023-09-18 DIAGNOSIS — J189 Pneumonia, unspecified organism: Secondary | ICD-10-CM | POA: Diagnosis present

## 2023-09-18 DIAGNOSIS — Z8249 Family history of ischemic heart disease and other diseases of the circulatory system: Secondary | ICD-10-CM

## 2023-09-18 DIAGNOSIS — Z1152 Encounter for screening for COVID-19: Secondary | ICD-10-CM

## 2023-09-18 DIAGNOSIS — E43 Unspecified severe protein-calorie malnutrition: Secondary | ICD-10-CM | POA: Diagnosis present

## 2023-09-18 DIAGNOSIS — F17290 Nicotine dependence, other tobacco product, uncomplicated: Secondary | ICD-10-CM | POA: Diagnosis present

## 2023-09-18 DIAGNOSIS — C787 Secondary malignant neoplasm of liver and intrahepatic bile duct: Secondary | ICD-10-CM | POA: Diagnosis present

## 2023-09-18 DIAGNOSIS — C7951 Secondary malignant neoplasm of bone: Secondary | ICD-10-CM | POA: Diagnosis present

## 2023-09-18 DIAGNOSIS — D72819 Decreased white blood cell count, unspecified: Secondary | ICD-10-CM | POA: Diagnosis present

## 2023-09-18 DIAGNOSIS — C7931 Secondary malignant neoplasm of brain: Secondary | ICD-10-CM | POA: Diagnosis present

## 2023-09-18 DIAGNOSIS — Z923 Personal history of irradiation: Secondary | ICD-10-CM

## 2023-09-18 DIAGNOSIS — J4520 Mild intermittent asthma, uncomplicated: Secondary | ICD-10-CM | POA: Diagnosis present

## 2023-09-18 DIAGNOSIS — Z6826 Body mass index (BMI) 26.0-26.9, adult: Secondary | ICD-10-CM

## 2023-09-18 DIAGNOSIS — T8481XA Embolism due to internal orthopedic prosthetic devices, implants and grafts, initial encounter: Secondary | ICD-10-CM | POA: Diagnosis not present

## 2023-09-18 DIAGNOSIS — I619 Nontraumatic intracerebral hemorrhage, unspecified: Secondary | ICD-10-CM | POA: Diagnosis present

## 2023-09-18 DIAGNOSIS — J351 Hypertrophy of tonsils: Secondary | ICD-10-CM | POA: Diagnosis present

## 2023-09-18 DIAGNOSIS — E0590 Thyrotoxicosis, unspecified without thyrotoxic crisis or storm: Secondary | ICD-10-CM | POA: Diagnosis present

## 2023-09-18 DIAGNOSIS — J358 Other chronic diseases of tonsils and adenoids: Secondary | ICD-10-CM

## 2023-09-18 DIAGNOSIS — Z833 Family history of diabetes mellitus: Secondary | ICD-10-CM

## 2023-09-18 DIAGNOSIS — R059 Cough, unspecified: Secondary | ICD-10-CM | POA: Diagnosis present

## 2023-09-18 DIAGNOSIS — Z1883 Retained stone or crystalline fragments: Secondary | ICD-10-CM | POA: Diagnosis not present

## 2023-09-18 DIAGNOSIS — I63412 Cerebral infarction due to embolism of left middle cerebral artery: Secondary | ICD-10-CM | POA: Diagnosis not present

## 2023-09-18 DIAGNOSIS — R0682 Tachypnea, not elsewhere classified: Secondary | ICD-10-CM | POA: Diagnosis present

## 2023-09-18 DIAGNOSIS — Z86711 Personal history of pulmonary embolism: Secondary | ICD-10-CM

## 2023-09-18 DIAGNOSIS — R519 Headache, unspecified: Secondary | ICD-10-CM | POA: Diagnosis not present

## 2023-09-18 DIAGNOSIS — I10 Essential (primary) hypertension: Secondary | ICD-10-CM | POA: Diagnosis present

## 2023-09-18 DIAGNOSIS — Z85828 Personal history of other malignant neoplasm of skin: Secondary | ICD-10-CM

## 2023-09-18 DIAGNOSIS — E876 Hypokalemia: Secondary | ICD-10-CM | POA: Diagnosis present

## 2023-09-18 DIAGNOSIS — Z7901 Long term (current) use of anticoagulants: Secondary | ICD-10-CM

## 2023-09-18 DIAGNOSIS — C7801 Secondary malignant neoplasm of right lung: Secondary | ICD-10-CM | POA: Diagnosis present

## 2023-09-18 DIAGNOSIS — E559 Vitamin D deficiency, unspecified: Secondary | ICD-10-CM | POA: Diagnosis present

## 2023-09-18 DIAGNOSIS — Z85118 Personal history of other malignant neoplasm of bronchus and lung: Secondary | ICD-10-CM

## 2023-09-18 DIAGNOSIS — Z8616 Personal history of COVID-19: Secondary | ICD-10-CM | POA: Diagnosis not present

## 2023-09-18 DIAGNOSIS — R29700 NIHSS score 0: Secondary | ICD-10-CM | POA: Diagnosis present

## 2023-09-18 DIAGNOSIS — I2699 Other pulmonary embolism without acute cor pulmonale: Secondary | ICD-10-CM | POA: Diagnosis present

## 2023-09-18 DIAGNOSIS — F419 Anxiety disorder, unspecified: Secondary | ICD-10-CM | POA: Diagnosis present

## 2023-09-18 DIAGNOSIS — R000 Tachycardia, unspecified: Secondary | ICD-10-CM | POA: Diagnosis present

## 2023-09-18 DIAGNOSIS — Z95828 Presence of other vascular implants and grafts: Secondary | ICD-10-CM

## 2023-09-18 DIAGNOSIS — R233 Spontaneous ecchymoses: Secondary | ICD-10-CM | POA: Diagnosis not present

## 2023-09-18 DIAGNOSIS — K219 Gastro-esophageal reflux disease without esophagitis: Secondary | ICD-10-CM | POA: Diagnosis present

## 2023-09-18 DIAGNOSIS — Z79899 Other long term (current) drug therapy: Secondary | ICD-10-CM

## 2023-09-18 DIAGNOSIS — D638 Anemia in other chronic diseases classified elsewhere: Secondary | ICD-10-CM | POA: Diagnosis present

## 2023-09-18 DIAGNOSIS — Z86718 Personal history of other venous thrombosis and embolism: Secondary | ICD-10-CM

## 2023-09-18 DIAGNOSIS — G43909 Migraine, unspecified, not intractable, without status migrainosus: Secondary | ICD-10-CM | POA: Diagnosis present

## 2023-09-18 LAB — URINALYSIS, W/ REFLEX TO CULTURE (INFECTION SUSPECTED)
Bilirubin Urine: NEGATIVE
Glucose, UA: NEGATIVE mg/dL
Ketones, ur: 5 mg/dL — AB
Nitrite: NEGATIVE
Protein, ur: NEGATIVE mg/dL
Specific Gravity, Urine: 1.02 (ref 1.005–1.030)
pH: 7 (ref 5.0–8.0)

## 2023-09-18 LAB — CBC WITH DIFFERENTIAL/PLATELET
Abs Immature Granulocytes: 0.03 10*3/uL (ref 0.00–0.07)
Basophils Absolute: 0 10*3/uL (ref 0.0–0.1)
Basophils Relative: 0 %
Eosinophils Absolute: 0 10*3/uL (ref 0.0–0.5)
Eosinophils Relative: 1 %
HCT: 29.1 % — ABNORMAL LOW (ref 36.0–46.0)
Hemoglobin: 8.8 g/dL — ABNORMAL LOW (ref 12.0–15.0)
Immature Granulocytes: 0 %
Lymphocytes Relative: 3 %
Lymphs Abs: 0.2 10*3/uL — ABNORMAL LOW (ref 0.7–4.0)
MCH: 26.9 pg (ref 26.0–34.0)
MCHC: 30.2 g/dL (ref 30.0–36.0)
MCV: 89 fL (ref 80.0–100.0)
Monocytes Absolute: 0.5 10*3/uL (ref 0.1–1.0)
Monocytes Relative: 7 %
Neutro Abs: 6.2 10*3/uL (ref 1.7–7.7)
Neutrophils Relative %: 89 %
Platelets: 282 10*3/uL (ref 150–400)
RBC: 3.27 MIL/uL — ABNORMAL LOW (ref 3.87–5.11)
RDW: 15 % (ref 11.5–15.5)
WBC: 7 10*3/uL (ref 4.0–10.5)
nRBC: 0 % (ref 0.0–0.2)

## 2023-09-18 LAB — COMPREHENSIVE METABOLIC PANEL
ALT: 20 U/L (ref 0–44)
AST: 46 U/L — ABNORMAL HIGH (ref 15–41)
Albumin: 2.7 g/dL — ABNORMAL LOW (ref 3.5–5.0)
Alkaline Phosphatase: 124 U/L (ref 38–126)
Anion gap: 11 (ref 5–15)
BUN: 10 mg/dL (ref 8–23)
CO2: 24 mmol/L (ref 22–32)
Calcium: 8.4 mg/dL — ABNORMAL LOW (ref 8.9–10.3)
Chloride: 96 mmol/L — ABNORMAL LOW (ref 98–111)
Creatinine, Ser: 0.71 mg/dL (ref 0.44–1.00)
GFR, Estimated: >60 mL/min (ref >60)
Glucose, Bld: 108 mg/dL — ABNORMAL HIGH (ref 70–99)
Potassium: 3.9 mmol/L (ref 3.5–5.1)
Sodium: 131 mmol/L — ABNORMAL LOW (ref 135–145)
Total Bilirubin: 1.4 mg/dL — ABNORMAL HIGH (ref <1.2)
Total Protein: 6.7 g/dL (ref 6.5–8.1)

## 2023-09-18 LAB — PROTIME-INR
INR: 2 — ABNORMAL HIGH (ref 0.8–1.2)
Prothrombin Time: 22.9 s — ABNORMAL HIGH (ref 11.4–15.2)

## 2023-09-18 LAB — RESP PANEL BY RT-PCR (RSV, FLU A&B, COVID)  RVPGX2
Influenza A by PCR: NEGATIVE
Influenza B by PCR: NEGATIVE
Resp Syncytial Virus by PCR: NEGATIVE
SARS Coronavirus 2 by RT PCR: NEGATIVE

## 2023-09-18 LAB — I-STAT CG4 LACTIC ACID, ED
Lactic Acid, Venous: 1.4 mmol/L (ref 0.5–1.9)
Lactic Acid, Venous: 0.4 mmol/L — ABNORMAL LOW (ref 0.5–1.9)

## 2023-09-18 MED ORDER — ACETAMINOPHEN 650 MG RE SUPP: 650.0000 mg | Freq: Four times a day (QID) | RECTAL | Status: DC | PRN

## 2023-09-18 MED ORDER — SODIUM CHLORIDE 0.9% FLUSH
3.0000 mL | Freq: Two times a day (BID) | INTRAVENOUS | Status: DC
  Administered 2023-09-19 – 2023-09-25 (×8): 3 mL via INTRAVENOUS

## 2023-09-18 MED ORDER — SODIUM CHLORIDE 0.9 % IV SOLN
2.0000 g | Freq: Once | INTRAVENOUS | Status: AC
  Administered 2023-09-18: 2 g via INTRAVENOUS
  Filled 2023-09-18: qty 12.5

## 2023-09-18 MED ORDER — ONDANSETRON HCL 4 MG PO TABS
4.0000 mg | ORAL_TABLET | Freq: Four times a day (QID) | ORAL | Status: DC | PRN
  Administered 2023-09-21 – 2023-09-22 (×2): 4 mg via ORAL
  Filled 2023-09-18 (×2): qty 1

## 2023-09-18 MED ORDER — SODIUM CHLORIDE 0.9 % IV SOLN
2.0000 g | INTRAVENOUS | Status: AC
  Administered 2023-09-18 – 2023-09-23 (×6): 2 g via INTRAVENOUS
  Filled 2023-09-18 (×6): qty 20

## 2023-09-18 MED ORDER — ACETAMINOPHEN 325 MG PO TABS
650.0000 mg | ORAL_TABLET | Freq: Once | ORAL | Status: AC
  Administered 2023-09-18: 650 mg via ORAL
  Filled 2023-09-18: qty 2

## 2023-09-18 MED ORDER — APIXABAN 5 MG PO TABS
5.0000 mg | ORAL_TABLET | Freq: Two times a day (BID) | ORAL | Status: DC
  Administered 2023-09-18 – 2023-09-22 (×8): 5 mg via ORAL
  Filled 2023-09-18 (×8): qty 1

## 2023-09-18 MED ORDER — KCL-LACTATED RINGERS-D5W 20 MEQ/L IV SOLN: INTRAVENOUS | Status: DC

## 2023-09-18 MED ORDER — ENSURE ENLIVE PO LIQD
237.0000 mL | Freq: Two times a day (BID) | ORAL | Status: DC
  Administered 2023-09-19: 237 mL via ORAL

## 2023-09-18 MED ORDER — VANCOMYCIN HCL 1500 MG/300ML IV SOLN
1500.0000 mg | Freq: Once | INTRAVENOUS | Status: AC
  Administered 2023-09-18: 1500 mg via INTRAVENOUS
  Filled 2023-09-18: qty 300

## 2023-09-18 MED ORDER — DEXTROSE 5 % IV SOLN
500.0000 mg | INTRAVENOUS | Status: DC
  Administered 2023-09-18: 500 mg via INTRAVENOUS
  Filled 2023-09-18: qty 5

## 2023-09-18 MED ORDER — HYDROCODONE-ACETAMINOPHEN 5-325 MG PO TABS
1.0000 | ORAL_TABLET | Freq: Four times a day (QID) | ORAL | Status: DC | PRN
  Administered 2023-09-21 (×3): 1 via ORAL
  Filled 2023-09-18 (×3): qty 1

## 2023-09-18 MED ORDER — ALPRAZOLAM 0.5 MG PO TABS
0.5000 mg | ORAL_TABLET | Freq: Two times a day (BID) | ORAL | Status: DC | PRN
  Administered 2023-09-18 – 2023-09-24 (×7): 0.5 mg via ORAL
  Filled 2023-09-18 (×7): qty 1

## 2023-09-18 MED ORDER — POLYETHYLENE GLYCOL 3350 17 G PO PACK: 17.0000 g | PACK | Freq: Every day | ORAL | Status: DC | PRN

## 2023-09-18 MED ORDER — SODIUM CHLORIDE 0.9 % IV BOLUS
1000.0000 mL | Freq: Once | INTRAVENOUS | Status: AC
  Administered 2023-09-18: 1000 mL via INTRAVENOUS

## 2023-09-18 MED ORDER — VITAMIN B-12 1000 MCG PO TABS: 1000.0000 ug | ORAL_TABLET | Freq: Every day | ORAL | Status: DC

## 2023-09-18 MED ORDER — ACETAMINOPHEN 325 MG PO TABS
650.0000 mg | ORAL_TABLET | Freq: Four times a day (QID) | ORAL | Status: DC | PRN
  Administered 2023-09-19 – 2023-09-22 (×5): 650 mg via ORAL
  Filled 2023-09-18 (×6): qty 2

## 2023-09-18 MED ORDER — KCL-LACTATED RINGERS-D5W 20 MEQ/L IV SOLN
INTRAVENOUS | Status: AC
Start: 2023-09-18 — End: 2023-09-19
  Filled 2023-09-18 (×3): qty 1000

## 2023-09-18 MED ORDER — PANTOPRAZOLE SODIUM 40 MG PO TBEC
40.0000 mg | DELAYED_RELEASE_TABLET | Freq: Two times a day (BID) | ORAL | Status: DC
  Administered 2023-09-18 – 2023-09-20 (×4): 40 mg via ORAL
  Filled 2023-09-18 (×4): qty 1

## 2023-09-18 MED ORDER — ORAL CARE MOUTH RINSE: 15.0000 mL | OROMUCOSAL | Status: DC | PRN

## 2023-09-18 MED ORDER — SUCRALFATE 1 GM/10ML PO SUSP
1.0000 g | Freq: Three times a day (TID) | ORAL | Status: DC
  Administered 2023-09-18 – 2023-09-20 (×6): 1 g via ORAL
  Filled 2023-09-18 (×6): qty 10

## 2023-09-18 MED ORDER — ALBUTEROL SULFATE (2.5 MG/3ML) 0.083% IN NEBU: 3.0000 mL | INHALATION_SOLUTION | Freq: Four times a day (QID) | RESPIRATORY_TRACT | Status: DC | PRN

## 2023-09-18 MED ORDER — FOLIC ACID 1 MG PO TABS
1.0000 mg | ORAL_TABLET | Freq: Every day | ORAL | Status: DC
  Administered 2023-09-19 – 2023-09-25 (×7): 1 mg via ORAL
  Filled 2023-09-18 (×8): qty 1

## 2023-09-18 NOTE — Telephone Encounter (Signed)
Fever, Tachy, SOB started today  Temp this am 101-F-102 F, heart rate " 135" and SOB, very fatigued.   Per Cassie , I instructed Kathlene November to take pt to ED .  "she gets out of breath pretty easily when she takes a few steps. He is calling EMS  Ange has not started any systemic treatment .   11/04-diagnosed with bilateral PE.

## 2023-09-18 NOTE — Telephone Encounter (Signed)
Oral Oncology Patient Advocate Encounter  Called to check status at [facility] and confirmed the correct insurance information was on file. Per the [facility] representative, the rx is undergoing final checks and is marked as expedited. They will reach out to patient to schedule as soon as the final checks by their pharmacist are complete.  Jinger Neighbors, CPhT-Adv Oncology Pharmacy Patient Advocate [facility] Direct Number: [phone]  Fax: [phone]

## 2023-09-18 NOTE — Plan of Care (Signed)
?  Problem: Education: ?Goal: Knowledge of General Education information will improve ?Description: Including pain rating scale, medication(s)/side effects and non-pharmacologic comfort measures ?Outcome: Progressing ?  ?Problem: Health Behavior/Discharge Planning: ?Goal: Ability to manage health-related needs will improve ?Outcome: Progressing ?  ?Problem: Activity: ?Goal: Risk for activity intolerance will decrease ?Outcome: Progressing ?  ?Problem: Nutrition: ?Goal: Adequate nutrition will be maintained ?Outcome: Progressing ?  ?Problem: Elimination: ?Goal: Will not experience complications related to urinary retention ?Outcome: Progressing ?  ?

## 2023-09-18 NOTE — Assessment & Plan Note (Signed)
Was diagnosed just a couple of weeks ago, at this time a plan is to continue with apixaban for this patient.

## 2023-09-18 NOTE — Assessment & Plan Note (Addendum)
Sources at this time not completely apparent.  Patient is noted to have slight elevation in LFTs.  I will perform a right upper quadrant ultrasound.  Is not reporting any pain or direct tenderness in this area.  Count is actually normal blood cultures have been sent.  Patient has minimal leukocytes in the urine.  At this time patient has already received antibiotics.  I am not sure if this represents a true UTI.  Urine culture would not be feasible at this time.  Chest x-ray looks pretty much comparable to the image obtained on September 04, 2023.  This time my plan would be to continue with empiric antibiotic therapy and see what the blood culture shows, certainly patient has an implanted port and is immunocompromised.  Therefore continued surveillance is needed.

## 2023-09-18 NOTE — Progress Notes (Signed)
A consult was received from an ED physician for vancomycin per pharmacy dosing.  The patient's profile has been reviewed for ht/wt/allergies/indication/available labs.    A one time order has been placed for vancomycin 1500 mg IV x1.  Further antibiotics/pharmacy consults should be ordered by admitting physician if indicated.                       Thank you, Lucia Gaskins 09/18/2023  1:57 PM

## 2023-09-18 NOTE — H&P (Addendum)
History and Physical    Patient: Rebecca Ochoa YNW:[id number] DOB: February 16, 1961 DOA: 09/18/2023 DOS: the patient was seen and examined on 09/18/2023 PCP: [name], MD  Patient coming from: Home  Chief Complaint:  Chief Complaint  Patient presents with   Shortness of Breath   HPI: [name] is a 62 y.o. female with medical history significant of lung cancerwho finished approximately 14 treatments of radiation therapy 2 weeks ago.  Planned for starting carboplatin and paclitaxel and Keytruda.  Unfortunately patient had new diagnosis of submassive bilateral pulmonary embolism on or around September 04, 2023 since which patient has been anticoagulated with apixaban.  Patient reports chronic sensation of shortness of breath, chest pain on laying down, occasional chest pain with deep inspiration or exertion as well as dry cough since then.  As well as a high heart rate.  Patient was in her usual state of health till around 8 AM this morning when she reports abrupt onset of feeling warm,'s mild to moderate headache associated with prostration.  Patient checked her temperature and it was as high as 103 F.  Patient was advised by her oncology service to come to the ER.  Patient denies any change in the character of her cough or shortness of breath.  Patient was found to be tachycardic in the ER.  Patient denies any ear pain sore throat nasal discharge skin boil dysuria diarrhea or vomiting.  No new abdominal pain.  Patient found to be "in sepsis "on presentation.  Lactic acid within normal limits, treated empirically with vancomycin and cefepime.  Patient at this time is feeling better, in terms of her headache and frustration.  Medical evaluation is sought. Review of Systems: As mentioned in the history of present illness. All other systems reviewed and are negative. Past Medical History:  Diagnosis Date   Anemia    Asthma, mild intermittent, well-controlled    GERD (gastroesophageal reflux  disease)    Hx of migraines    Hyperlipidemia    Hypertension    Vitamin D deficiency    Past Surgical History:  Procedure Laterality Date   BREAST SURGERY     reduction   BRONCHIAL BIOPSY  08/08/2023   Procedure: BRONCHIAL BIOPSIES;  Surgeon: Leslye Peer, MD;  Location: [facility] ENDOSCOPY;  Service: Pulmonary;;   BRONCHIAL BRUSHINGS  08/08/2023   Procedure: BRONCHIAL BRUSHINGS;  Surgeon: Leslye Peer, MD;  Location: [facility] ENDOSCOPY;  Service: Pulmonary;;   BRONCHIAL NEEDLE ASPIRATION BIOPSY  08/08/2023   Procedure: BRONCHIAL NEEDLE ASPIRATION BIOPSIES;  Surgeon: Leslye Peer, MD;  Location: [facility] ENDOSCOPY;  Service: Pulmonary;;   ENDOBRONCHIAL ULTRASOUND Bilateral 08/08/2023   Procedure: ENDOBRONCHIAL ULTRASOUND;  Surgeon: Leslye Peer, MD;  Location: [facility] ENDOSCOPY;  Service: Pulmonary;  Laterality: Bilateral;   HEMOSTASIS CONTROL  08/08/2023   Procedure: HEMOSTASIS CONTROL;  Surgeon: Leslye Peer, MD;  Location: [facility] ENDOSCOPY;  Service: Pulmonary;;   IR IMAGING GUIDED PORT INSERTION  09/01/2023   REDUCTION MAMMAPLASTY     TUBAL LIGATION     Social History:  reports that she has been smoking e-cigarettes. She has never used smokeless tobacco. She reports current alcohol use of about 3.0 standard drinks of alcohol per week. She reports that she does not use drugs.  Allergies  Allergen Reactions   Biaxin [Clarithromycin] Nausea Only    Family History  Problem Relation Age of Onset   Heart disease Father    Hypertension Father    Diabetes Father    Kidney disease Father  Prior to Admission medications   Medication Sig Start Date End Date Taking? Authorizing Provider  acetaminophen (TYLENOL) 500 MG tablet Take 1,000 mg by mouth every 8 (eight) hours as needed for mild pain.   Yes [provider]  albuterol (VENTOLIN HFA) 108 (90 Base) MCG/ACT inhaler Inhale 2 puffs into the lungs every 6 (six) hours as needed for wheezing or shortness of breath. 05/30/23  Yes Raynelle Dick, NP  ALPRAZolam Prudy Feeler) 0.5 MG tablet Take 1 tablet (0.5 mg total) by mouth See admin instructions. Take 0.5 mg by mouth at bedtime and an additional 0.5 mg once a day as needed for anxiety 09/14/23  Yes Raynelle Dick, NP  apixaban (ELIQUIS) 5 MG TABS tablet Take 2 tablets (10 mg total) by mouth 2 (two) times daily for 6 days, THEN 1 tablet (5 mg total) 2 (two) times daily. 09/07/23 10/13/23 Yes Rhetta Mura, MD  folic acid (FOLVITE) 1 MG tablet Take 1 tablet (1 mg total) by mouth daily. 09/12/23  Yes Heilingoetter, Cassandra L, PA-C  HYDROcodone-acetaminophen (NORCO/VICODIN) 5-325 MG tablet Take 1 tablet by mouth every 6 (six) hours as needed. Patient taking differently: Take 1 tablet by mouth every 6 (six) hours as needed for moderate pain (pain score 4-6). 09/07/23  Yes Rhetta Mura, MD  ondansetron (ZOFRAN) 4 MG tablet Take 1 tablet (4 mg total) by mouth every 6 (six) hours as needed for nausea. 08/17/23  Yes Rolly Salter, MD  pantoprazole (PROTONIX) 40 MG tablet Take 1 tablet (40 mg total) by mouth 2 (two) times daily. 09/07/23  Yes Rhetta Mura, MD  sucralfate (CARAFATE) 1 GM/10ML suspension Take 10 mLs (1 g total) by mouth 4 (four) times daily -  with meals and at bedtime. 09/07/23  Yes Rhetta Mura, MD  cyanocobalamin 1000 MCG tablet Take 1 tablet (1,000 mcg total) by mouth daily. Patient not taking: Reported on 09/18/2023 08/18/23   Rolly Salter, MD  HYDROcodone bit-homatropine [facility]) 5-1.5 MG/5ML syrup Take 5 mLs by mouth every 4 (four) hours as needed for cough. Patient not taking: Reported on 09/18/2023 08/24/23   Si Gaul, MD  osimertinib mesylate (TAGRISSO) 80 MG tablet Take 1 tablet (80 mg total) by mouth daily. Patient not taking: Reported on 09/18/2023 09/12/23   Si Gaul, MD  bisoprolol-hydrochlorothiazide [facility]) 5-6.25 MG tablet Take  1 tablet  Daily  for BP 06/06/22 07/11/22  [name], MD    Physical  Exam: Vitals:   09/18/23 1600 09/18/23 1630 09/18/23 1700 09/18/23 1732  BP: 119/64 110/68 98/64   Pulse: (!) 125 (!) 124 (!) 121   Resp: (!) 29 (!) 31 (!) 32   Temp:    99.4 F (37.4 C)  TempSrc:      SpO2: 92% 94% 94%   Weight:      Height:       General: Patient is alert and awake, gives a coherent account of her symptoms, boyfriend at bedside.  Patient does not appear to be distressed Respiratory exam: All air entry is vesicular, reduced breath sounds right lung base posteriorly with dullness to percussion No expiratory wheezes Cardiovascular exam S1-S2 normal Left subclavian port in situ accessed No marked tenderness there Abdomen soft nontender no CVA angle tenderness Extremities warm without edema no focal motor deficit. Data Reviewed:   Labs on Admission:  Results for orders placed or performed during the hospital encounter of 09/18/23 (from the past 24 hour(s))  Comprehensive metabolic panel     Status: Abnormal  Collection Time: 09/18/23  1:26 PM  Result Value Ref Range   Sodium 131 (L) 135 - 145 mmol/L   Potassium 3.9 3.5 - 5.1 mmol/L   Chloride 96 (L) 98 - 111 mmol/L   CO2 24 22 - 32 mmol/L   Glucose, Bld 108 (H) 70 - 99 mg/dL   BUN 10 8 - 23 mg/dL   Creatinine, Ser 1.61 0.44 - 1.00 mg/dL   Calcium 8.4 (L) 8.9 - 10.3 mg/dL   Total Protein 6.7 6.5 - 8.1 g/dL   Albumin 2.7 (L) 3.5 - 5.0 g/dL   AST 46 (H) 15 - 41 U/L   ALT 20 0 - 44 U/L   Alkaline Phosphatase 124 38 - 126 U/L   Total Bilirubin 1.4 (H) <1.2 mg/dL   GFR, Estimated >09 >60 mL/min   Anion gap 11 5 - 15  CBC with Differential     Status: Abnormal   Collection Time: 09/18/23  1:26 PM  Result Value Ref Range   WBC 7.0 4.0 - 10.5 K/uL   RBC 3.27 (L) 3.87 - 5.11 MIL/uL   Hemoglobin 8.8 (L) 12.0 - 15.0 g/dL   HCT 45.4 (L) 09.8 - 11.9 %   MCV 89.0 80.0 - 100.0 fL   MCH 26.9 26.0 - 34.0 pg   MCHC 30.2 30.0 - 36.0 g/dL   RDW 14.7 82.9 - 56.2 %   Platelets 282 150 - 400 K/uL   nRBC 0.0 0.0 - 0.2 %    Neutrophils Relative % 89 %   Neutro Abs 6.2 1.7 - 7.7 K/uL   Lymphocytes Relative 3 %   Lymphs Abs 0.2 (L) 0.7 - 4.0 K/uL   Monocytes Relative 7 %   Monocytes Absolute 0.5 0.1 - 1.0 K/uL   Eosinophils Relative 1 %   Eosinophils Absolute 0.0 0.0 - 0.5 K/uL   Basophils Relative 0 %   Basophils Absolute 0.0 0.0 - 0.1 K/uL   Immature Granulocytes 0 %   Abs Immature Granulocytes 0.03 0.00 - 0.07 K/uL  I-Stat Lactic Acid, ED     Status: None   Collection Time: 09/18/23  1:34 PM  Result Value Ref Range   Lactic Acid, Venous 1.4 0.5 - 1.9 mmol/L  I-Stat Lactic Acid, ED     Status: Abnormal   Collection Time: 09/18/23  3:43 PM  Result Value Ref Range   Lactic Acid, Venous 0.4 (L) 0.5 - 1.9 mmol/L  Resp panel by RT-PCR (RSV, Flu A&B, Covid) Anterior Nasal Swab     Status: None   Collection Time: 09/18/23  4:36 PM   Specimen: Anterior Nasal Swab  Result Value Ref Range   SARS Coronavirus 2 by RT PCR NEGATIVE NEGATIVE   Influenza A by PCR NEGATIVE NEGATIVE   Influenza B by PCR NEGATIVE NEGATIVE   Resp Syncytial Virus by PCR NEGATIVE NEGATIVE  Urinalysis, w/ Reflex to Culture (Infection Suspected) -Urine, Clean Catch     Status: Abnormal   Collection Time: 09/18/23  4:46 PM  Result Value Ref Range   Specimen Source URINE, CATHETERIZED    Color, Urine YELLOW YELLOW   APPearance HAZY (A) CLEAR   Specific Gravity, Urine 1.020 1.005 - 1.030   pH 7.0 5.0 - 8.0   Glucose, UA NEGATIVE NEGATIVE mg/dL   Hgb urine dipstick SMALL (A) NEGATIVE   Bilirubin Urine NEGATIVE NEGATIVE   Ketones, ur 5 (A) NEGATIVE mg/dL   Protein, ur NEGATIVE NEGATIVE mg/dL   Nitrite NEGATIVE NEGATIVE   Leukocytes,Ua TRACE (A)  NEGATIVE   RBC / HPF 0-5 0 - 5 RBC/hpf   WBC, UA 6-10 0 - 5 WBC/hpf   Bacteria, UA RARE (A) NONE SEEN   Squamous Epithelial / HPF 11-20 0 - 5 /HPF   Mucus PRESENT    Basic Metabolic Panel: Recent Labs  Lab 09/12/23 1149 09/18/23 1326  NA 137 131*  K 3.0* 3.9  CL 101 96*  CO2 24 24   GLUCOSE 117* 108*  BUN 9 10  CREATININE 0.48 0.71  CALCIUM 8.9 8.4*   Liver Function Tests: Recent Labs  Lab 09/12/23 1149 09/18/23 1326  AST 34 46*  ALT 23 20  ALKPHOS 105 124  BILITOT 0.9 1.4*  PROT 6.6 6.7  ALBUMIN 2.8* 2.7*   No results for input(s): "LIPASE", "AMYLASE" in the last 168 hours. No results for input(s): "AMMONIA" in the last 168 hours. CBC: Recent Labs  Lab 09/12/23 1149 09/18/23 1326  WBC 8.5 7.0  NEUTROABS 7.3 6.2  HGB 10.0* 8.8*  HCT 32.8* 29.1*  MCV 92.9 89.0  PLT 230 282   Cardiac Enzymes: Recent Labs  Lab 09/12/23 1149 09/12/23 1323  TROPONINIHS 8 9    BNP (last 3 results) No results for input(s): "PROBNP" in the last 8760 hours. CBG: No results for input(s): "GLUCAP" in the last 168 hours.  Radiological Exams on Admission:  DG Chest 2 View  Result Date: 09/18/2023 CLINICAL DATA:  Shortness of breath. Nonproductive cough. Suspected Sepsis EXAM: CHEST - 2 VIEW COMPARISON:  09/04/2023. FINDINGS: Redemonstration of multiple lobulated opacities throughout bilateral lungs, right more than left, compatible with patient's known history of pulmonary metastasis. No acute consolidation or lung collapse seen. Bilateral costophrenic angles are clear. Note is made of elevated right hemidiaphragm. Normal cardio-mediastinal silhouette. No acute osseous abnormalities. The soft tissues are within normal limits. Left-sided CT Port-A-Cath is again seen with its tip overlying the cavoatrial junction region. IMPRESSION: *No active cardiopulmonary disease. *Redemonstration of extensive lung opacities, compatible with metastases. Electronically Signed   By: Jules Schick M.D.   On: 09/18/2023 16:00    EKG: Independently reviewed. Sinus tachycardia.  Assessment and Plan: * Sepsis (HCC) Sources at this time not completely apparent.  Patient is noted to have slight elevation in LFTs.  I will perform a right upper quadrant ultrasound.  Is not reporting any pain  or direct tenderness in this area.  Count is actually normal blood cultures have been sent.  Patient has minimal leukocytes in the urine.  At this time patient has already received antibiotics.  I am not sure if this represents a true UTI.  Urine culture would not be feasible at this time.  Chest x-ray looks pretty much comparable to the image obtained on September 04, 2023.  This time my plan would be to continue with empiric antibiotic therapy and see what the blood culture shows, certainly patient has an implanted port and is immunocompromised.  Therefore continued surveillance is needed.  Pulmonary embolism (HCC) Was diagnosed just a couple of weeks ago, at this time a plan is to continue with apixaban for this patient.  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 P[facility]net has not yet started taking osimertinib mesylate (TAGRISSO) 80 MG tablet       Advance Care Planning:   Code Status: Full Code   Consults: none at this time.  Family Communication: per patient. Boyfriend at bedside during this encounter.  Severity of Illness: The appropriate patient status for this  patient is INPATIENT. Inpatient status is judged to be reasonable and necessary in order to provide the required intensity of service to ensure the patient's safety. The patient's presenting symptoms, physical exam findings, and initial radiographic and laboratory data in the context of their chronic comorbidities is felt to place them at high risk for further clinical deterioration. Furthermore, it is not anticipated that the patient will be medically stable for discharge from the hospital within 2 midnights of admission.   * I certify that at the point of admission it is my clinical judgment that the patient will require inpatient hospital care spanning beyond 2 midnights from the point of admission due to high intensity of service, high risk for further deterioration and high frequency of  surveillance required.*  Author: Nolberto Hanlon, MD 09/18/2023 5:49 PM  For on call review [email or web address].

## 2023-09-18 NOTE — ED Provider Notes (Signed)
[place] EMERGENCY DEPARTMENT AT [facility] Provider Note   CSN: [id number] Arrival date & time: 09/18/23  1243     History  Chief Complaint  Patient presents with   Shortness of Breath    Rebecca Ochoa is a 62 y.o. female.  62 year old female with past medical history of lung cancer and pulmonary embolism on Eliquis presenting to the emergency department today with fever.  The patient has undergone radiation treatment for her lung cancer but has not began chemotherapy.  She states that she was recently diagnosed with a pulmonary embolism 2 weeks ago.  She had a temperature of 101 at home.  She came to the ER today for further evaluation regarding this.  She reports that she has had persistent shortness of breath since being diagnosed with the pulmonary embolism.  This is no worse today.  She reports that she has had a nonproductive cough.   Shortness of Breath Associated symptoms: cough        Home Medications Prior to Admission medications   Medication Sig Start Date End Date Taking? Authorizing Provider  acetaminophen (TYLENOL) 500 MG tablet Take 1,000 mg by mouth every 8 (eight) hours as needed for mild pain.   Yes [provider]  albuterol (VENTOLIN HFA) 108 (90 Base) MCG/ACT inhaler Inhale 2 puffs into the lungs every 6 (six) hours as needed for wheezing or shortness of breath. 05/30/23  Yes Raynelle Dick, NP  ALPRAZolam Prudy Feeler) 0.5 MG tablet Take 1 tablet (0.5 mg total) by mouth See admin instructions. Take 0.5 mg by mouth at bedtime and an additional 0.5 mg once a day as needed for anxiety 09/14/23  Yes Raynelle Dick, NP  apixaban (ELIQUIS) 5 MG TABS tablet Take 2 tablets (10 mg total) by mouth 2 (two) times daily for 6 days, THEN 1 tablet (5 mg total) 2 (two) times daily. 09/07/23 10/13/23 Yes Rhetta Mura, MD  folic acid (FOLVITE) 1 MG tablet Take 1 tablet (1 mg total) by mouth daily. 09/12/23  Yes Heilingoetter, Cassandra L, PA-C   HYDROcodone-acetaminophen (NORCO/VICODIN) 5-325 MG tablet Take 1 tablet by mouth every 6 (six) hours as needed. Patient taking differently: Take 1 tablet by mouth every 6 (six) hours as needed for moderate pain (pain score 4-6). 09/07/23  Yes Rhetta Mura, MD  ondansetron (ZOFRAN) 4 MG tablet Take 1 tablet (4 mg total) by mouth every 6 (six) hours as needed for nausea. 08/17/23  Yes Rolly Salter, MD  pantoprazole (PROTONIX) 40 MG tablet Take 1 tablet (40 mg total) by mouth 2 (two) times daily. 09/07/23  Yes Rhetta Mura, MD  sucralfate (CARAFATE) 1 GM/10ML suspension Take 10 mLs (1 g total) by mouth 4 (four) times daily -  with meals and at bedtime. 09/07/23  Yes Rhetta Mura, MD  cyanocobalamin 1000 MCG tablet Take 1 tablet (1,000 mcg total) by mouth daily. Patient not taking: Reported on 09/18/2023 08/18/23   Rolly Salter, MD  HYDROcodone bit-homatropine [facility]) 5-1.5 MG/5ML syrup Take 5 mLs by mouth every 4 (four) hours as needed for cough. Patient not taking: Reported on 09/18/2023 08/24/23   Si Gaul, MD  osimertinib mesylate (TAGRISSO) 80 MG tablet Take 1 tablet (80 mg total) by mouth daily. Patient not taking: Reported on 09/18/2023 09/12/23   Si Gaul, MD  bisoprolol-hydrochlorothiazide [facility]) 5-6.25 MG tablet Take  1 tablet  Daily  for BP 06/06/22 07/11/22  [name], MD      Allergies    Biaxin [clarithromycin]  Review of Systems   Review of Systems  Respiratory:  Positive for cough and shortness of breath.   All other systems reviewed and are negative.   Physical Exam Updated Vital Signs BP 119/64   Pulse (!) 125   Temp (!) 102 F (38.9 C)   Resp (!) 29   Ht 5' (1.524 m)   Wt 73.5 kg   SpO2 92%   BMI 31.64 kg/m  Physical Exam Vitals and nursing note reviewed.   Gen: Mild conversational dyspnea noted Eyes: PERRL, EOMI HEENT: no oropharyngeal swelling Neck: trachea midline Resp: Diminished over right middle and  upper lung field Card: Tachycardic, no murmurs, rubs, or gallops Abd: nontender, nondistended Extremities: no calf tenderness, no edema Vascular: 2+ radial pulses bilaterally, 2+ DP pulses bilaterally Skin: no rashes Psyc: acting appropriately   ED Results / Procedures / Treatments   Labs (all labs ordered are listed, but only abnormal results are displayed) Labs Reviewed  COMPREHENSIVE METABOLIC PANEL - Abnormal; Notable for the following components:      Result Value   Sodium 131 (*)    Chloride 96 (*)    Glucose, Bld 108 (*)    Calcium 8.4 (*)    Albumin 2.7 (*)    AST 46 (*)    Total Bilirubin 1.4 (*)    All other components within normal limits  CBC WITH DIFFERENTIAL/PLATELET - Abnormal; Notable for the following components:   RBC 3.27 (*)    Hemoglobin 8.8 (*)    HCT 29.1 (*)    Lymphs Abs 0.2 (*)    All other components within normal limits  I-STAT CG4 LACTIC ACID, ED - Abnormal; Notable for the following components:   Lactic Acid, Venous 0.4 (*)    All other components within normal limits  CULTURE, BLOOD (ROUTINE X 2)  CULTURE, BLOOD (ROUTINE X 2)  RESP PANEL BY RT-PCR (RSV, FLU A&B, COVID)  [id number]  URINALYSIS, W/ REFLEX TO CULTURE (INFECTION SUSPECTED)  PROTIME-INR  I-STAT CG4 LACTIC ACID, ED    EKG None  Radiology DG Chest 2 View  Result Date: 09/18/2023 CLINICAL DATA:  Shortness of breath. Nonproductive cough. Suspected Sepsis EXAM: CHEST - 2 VIEW COMPARISON:  09/04/2023. FINDINGS: Redemonstration of multiple lobulated opacities throughout bilateral lungs, right more than left, compatible with patient's known history of pulmonary metastasis. No acute consolidation or lung collapse seen. Bilateral costophrenic angles are clear. Note is made of elevated right hemidiaphragm. Normal cardio-mediastinal silhouette. No acute osseous abnormalities. The soft tissues are within normal limits. Left-sided CT Port-A-Cath is again seen with its tip overlying the  cavoatrial junction region. IMPRESSION: *No active cardiopulmonary disease. *Redemonstration of extensive lung opacities, compatible with metastases. Electronically Signed   By: Jules Schick M.D.   On: 09/18/2023 16:00    Procedures Procedures    Medications Ordered in ED Medications  vancomycin (VANCOREADY) IVPB 1500 mg/300 mL (1,500 mg Intravenous New Bag/Given 09/18/23 1532)  sodium chloride 0.9 % bolus 1,000 mL (1,000 mLs Intravenous New Bag/Given 09/18/23 1421)  ceFEPIme (MAXIPIME) 2 g in sodium chloride 0.9 % 100 mL IVPB (0 g Intravenous Stopped 09/18/23 1500)  acetaminophen (TYLENOL) tablet 650 mg (650 mg Oral Given 09/18/23 1612)    ED Course/ Medical Decision Making/ A&P                                 Medical Decision Making 62 year old female with past medical history of lung  cancer and pulmonary embolism on Eliquis presenting to the emergency department today with tachycardia and fever.  Initiate a sepsis workup on the patient.  Will cover her empirically with vancomycin and cefepime for healthcare associated pneumonia as she does have a port in place and does have the lung cancer with recent hospital admission for pulmonary embolism.  I will repeat an x-ray here to evaluate for new opacities.  Will also obtain a COVID and flu swab on the patient.  I will give patient IV fluids here with her lactic acid pending.  It sounds like she has been persistently tachycardic now over the past few weeks so unclear if this is a sepsis marker or more of her chronic issues.  Discussed her case with her oncologist after her initial workup is complete for ultimate disposition.  She is not requiring oxygen here today.  The patient's labs are largely reassuring.  Chest x-ray does not show any new focal lesions.  Despite this patient remains febrile and tachycardic here.  Calls placed to hospital service for admission with cultures pending  Amount and/or Complexity of Data Reviewed Labs:  ordered. Radiology: ordered.  Risk OTC drugs. Prescription drug management. Decision regarding hospitalization.           Final Clinical Impression(s) / ED Diagnoses Final diagnoses:  Sepsis, due to unspecified organism, unspecified whether acute organ dysfunction present [facility])    Rx / DC Orders ED Discharge Orders     None         Durwin Glaze, MD 09/18/23 1654

## 2023-09-18 NOTE — ED Triage Notes (Signed)
Pt BIBA presenting with SHOB and non-productive cough, from home, recent dx of stage 4 lung cancer. Has Bilateral PEs, developed fever this morning and called pcp who wanted her to be evaluated in ED. Pt does endorse chest pain on inspiration. Has not started chemo yet. Temp 102.5 oral

## 2023-09-18 NOTE — Assessment & Plan Note (Signed)
See HPI. Paitnet has not yet started taking osimertinib mesylate (TAGRISSO) 80 MG tablet

## 2023-09-19 ENCOUNTER — Other Ambulatory Visit: Payer: No Typology Code available for payment source

## 2023-09-19 ENCOUNTER — Other Ambulatory Visit: Payer: Self-pay

## 2023-09-19 ENCOUNTER — Other Ambulatory Visit (HOSPITAL_COMMUNITY): Payer: Self-pay

## 2023-09-19 ENCOUNTER — Encounter (HOSPITAL_COMMUNITY): Payer: Self-pay | Admitting: Internal Medicine

## 2023-09-19 ENCOUNTER — Telehealth: Payer: Self-pay

## 2023-09-19 ENCOUNTER — Telehealth: Payer: Self-pay | Admitting: Internal Medicine

## 2023-09-19 DIAGNOSIS — C3411 Malignant neoplasm of upper lobe, right bronchus or lung: Secondary | ICD-10-CM

## 2023-09-19 DIAGNOSIS — I2695 Cement embolism of pulmonary artery without acute cor pulmonale: Secondary | ICD-10-CM | POA: Diagnosis not present

## 2023-09-19 DIAGNOSIS — A419 Sepsis, unspecified organism: Secondary | ICD-10-CM | POA: Diagnosis not present

## 2023-09-19 DIAGNOSIS — T8481XA Embolism due to internal orthopedic prosthetic devices, implants and grafts, initial encounter: Secondary | ICD-10-CM

## 2023-09-19 DIAGNOSIS — Z1883 Retained stone or crystalline fragments: Secondary | ICD-10-CM

## 2023-09-19 LAB — CBC
HCT: 22.4 % — ABNORMAL LOW (ref 36.0–46.0)
Hemoglobin: 6.8 g/dL — CL (ref 12.0–15.0)
MCH: 27.4 pg (ref 26.0–34.0)
MCHC: 30.4 g/dL (ref 30.0–36.0)
MCV: 90.3 fL (ref 80.0–100.0)
Platelets: 224 10*3/uL (ref 150–400)
RBC: 2.48 MIL/uL — ABNORMAL LOW (ref 3.87–5.11)
RDW: 15.1 % (ref 11.5–15.5)
WBC: 3.6 10*3/uL — ABNORMAL LOW (ref 4.0–10.5)
nRBC: 0 % (ref 0.0–0.2)

## 2023-09-19 LAB — BASIC METABOLIC PANEL
Anion gap: 7 (ref 5–15)
BUN: 9 mg/dL (ref 8–23)
CO2: 24 mmol/L (ref 22–32)
Calcium: 8 mg/dL — ABNORMAL LOW (ref 8.9–10.3)
Chloride: 102 mmol/L (ref 98–111)
Creatinine, Ser: 0.65 mg/dL (ref 0.44–1.00)
GFR, Estimated: >60 mL/min (ref >60)
Glucose, Bld: 136 mg/dL — ABNORMAL HIGH (ref 70–99)
Potassium: 3.2 mmol/L — ABNORMAL LOW (ref 3.5–5.1)
Sodium: 133 mmol/L — ABNORMAL LOW (ref 135–145)

## 2023-09-19 LAB — TSH: TSH: 0.179 u[IU]/mL — ABNORMAL LOW (ref 0.350–4.500)

## 2023-09-19 LAB — HEMOGLOBIN AND HEMATOCRIT, BLOOD
HCT: 26.8 % — ABNORMAL LOW (ref 36.0–46.0)
Hemoglobin: 8.3 g/dL — ABNORMAL LOW (ref 12.0–15.0)

## 2023-09-19 LAB — ABO/RH: ABO/RH(D): B POS

## 2023-09-19 LAB — PROTIME-INR
INR: 2.1 — ABNORMAL HIGH (ref 0.8–1.2)
Prothrombin Time: 23.6 s — ABNORMAL HIGH (ref 11.4–15.2)

## 2023-09-19 LAB — APTT: aPTT: 40 s — ABNORMAL HIGH (ref 24–36)

## 2023-09-19 LAB — PREPARE RBC (CROSSMATCH)

## 2023-09-19 MED ORDER — SODIUM CHLORIDE 0.9% FLUSH: 10.0000 mL | INTRAVENOUS | Status: DC | PRN

## 2023-09-19 MED ORDER — SODIUM CHLORIDE 0.9% FLUSH
10.0000 mL | Freq: Two times a day (BID) | INTRAVENOUS | Status: DC
  Administered 2023-09-19 – 2023-09-24 (×10): 10 mL

## 2023-09-19 MED ORDER — SODIUM CHLORIDE 0.9% IV SOLUTION: Freq: Once | INTRAVENOUS | Status: AC

## 2023-09-19 MED ORDER — AZITHROMYCIN 250 MG PO TABS
500.0000 mg | ORAL_TABLET | Freq: Every day | ORAL | Status: AC
  Administered 2023-09-19 – 2023-09-22 (×4): 500 mg via ORAL
  Filled 2023-09-19 (×4): qty 2

## 2023-09-19 MED ORDER — POTASSIUM CHLORIDE CRYS ER 20 MEQ PO TBCR
40.0000 meq | EXTENDED_RELEASE_TABLET | Freq: Once | ORAL | Status: AC
  Administered 2023-09-19: 40 meq via ORAL
  Filled 2023-09-19: qty 2

## 2023-09-19 MED ORDER — CHLORHEXIDINE GLUCONATE CLOTH 2 % EX PADS
6.0000 | MEDICATED_PAD | Freq: Every day | CUTANEOUS | Status: DC
  Administered 2023-09-21 – 2023-09-25 (×5): 6 via TOPICAL

## 2023-09-19 MED FILL — Fosaprepitant Dimeglumine For IV Infusion 150 MG (Base Eq): INTRAVENOUS | Qty: 5 | Status: AC

## 2023-09-19 NOTE — Telephone Encounter (Signed)
I tried to get in contact with patient to sent up a hospital follow up. I left a voicemail on her phone on 11/7. Then on 11/8 I called and she said she needs to see how her schedule looks so she will call back later. I called for the last time on 11/18 and left a voicemail.

## 2023-09-19 NOTE — Hospital Course (Addendum)
Rebecca Ochoa is a 61 y.o. female with a history of lung cancer, anemia, pulmonary embolism.  Patient presented secondary to fever and headache with evidence of SIRS on admission. No source identified. Patient started empirically on Ceftriaxone and azithromycin and is improving. During hospitalization, patient developed persistent headache. 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

## 2023-09-19 NOTE — Progress Notes (Addendum)
PROGRESS NOTE    Lynsey Schiesser  XBJ:[id number] DOB: 06/08/1961 DOA: 09/18/2023 PCP: [name], MD    Brief Narrative:   Rebecca Ochoa is a 62 y.o. female with medical history of lung cancer who finished approximately 14 treatments of radiation therapy 2 weeks ago with plans for chemotherapy, recent diagnosis of bilateral Pulmonary embolism on November 4 on Eliquis presented to the hospital with chest pain shortness of breath and dry cough since then.  She also had headache and was noted to have fever.  Patient had been to her oncologist for referred the patient to come to the ED. In the ED, patient was febrile with a temperature of 102.7 F with tachycardia at 136 and tachypnea.  Initial lactate was 1.24.  WBC at 7.0.  Urinalysis showed 6-10 white cells.  Influenza and COVID was negative.  Patient was then considered for admission to the hospital for further evaluation and treatment.  Assessment and Plan:  Possible sepsis (HCC) LFTs were elevated.  Patient had fever tachycardia tachypnea on presentation with possible pulmonary source of infection.  Had mild leukocytosis in the urine.  Patient received antibiotic in the ED.  Follow urine and blood cultures.  Culture.  Chest x-ray comparable to previous chest x-ray November 4..  CTA chest was done 09/11/2021 showed clearing partial of acute pulmonary thromboemboli.  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  Continue empiric antibiotic with Rocephin and Zithromax..  Patient is immunocompromised with port in place.  TSH low at 0.1.  Will need to follow-up as outpatient.  Anemia likely secondary to malignancy.  Will transfuse 1 unit of packed RBC today.  Hemoglobin today at 6.8.  Denies any GI bleed or hematuria or overt blood loss.  Hypokalemia.  Potassium of 3.2 today.  Will replenish orally.  Check levels in AM.  On D5 Ringer lactate with potassium.  Pulmonary embolism (HCC) Continue Eliquis from  outpatient.  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  Received 14-day course of radiation treatment.  Plan for chemotherapy as outpatient.      DVT prophylaxis: SCDs Start: 09/18/23 1722 apixaban (ELIQUIS) tablet 5 mg   Code Status:     Code Status: Full Code  Disposition: Home likely in 2 to 3 days  Status is: Inpatient  Remains inpatient appropriate because: Pending clinical improvement, question sepsis, follow cultures, IV antibiotics.   Family Communication: Spoke with the patient's partner at bedside Mr. Kathlene November.  Consultants:  None  Procedures:  None  Antimicrobials:  Rocephin and Zithromax IV  Anti-infectives (From admission, onward)    Start     Dose/Rate Route Frequency Ordered Stop   09/19/23 1800  azithromycin (ZITHROMAX) tablet 500 mg        500 mg Oral Daily 09/19/23 0751 09/23/23 1759   09/18/23 2200  cefTRIAXone (ROCEPHIN) 2 g in sodium chloride 0.9 % 100 mL IVPB        2 g 200 mL/hr over 30 Minutes Intravenous Every 24 hours 09/18/23 1758     09/18/23 1800  azithromycin (ZITHROMAX) 500 mg in dextrose 5 % 250 mL IVPB  Status:  Discontinued        500 mg 250 mL/hr over 60 Minutes Intravenous Every 24 hours 09/18/23 1759 09/19/23 0751   09/18/23 1430  vancomycin (VANCOREADY) IVPB 1500 mg/300 mL        1,500 mg  150 mL/hr over 120 Minutes Intravenous  Once 09/18/23 1359 09/18/23 1732   09/18/23 1400  ceFEPIme (MAXIPIME) 2 g in sodium chloride 0.9 % 100 mL IVPB        2 g 200 mL/hr over 30 Minutes Intravenous  Once 09/18/23 1348 09/18/23 1500        Subjective: Today, patient was seen and examined at bedside.  Patient still does not feel better.  Still has cough with shortness of breath and has not felt much better.  Has been having intermittent fevers.  Noted to be anemic without any external blood loss or black stools.  Patient's significant  other at bedside.  Objective: Vitals:   09/19/23 0347 09/19/23 0444 09/19/23 0828 09/19/23 0857  BP: 110/61 108/62 105/66 109/66  Pulse: (!) 124 (!) 120 (!) 114 (!) 119  Resp: 17 18 20 19   Temp: 99.7 F (37.6 C) 98.7 F (37.1 C) 98.8 F (37.1 C) 99.7 F (37.6 C)  TempSrc: Oral Oral Oral Oral  SpO2: 94% 95% 97% 97%  Weight:    73.5 kg  Height:    5\' 5"  (1.651 m)    Intake/Output Summary (Last 24 hours) at 09/19/2023 0929 Last data filed at 09/19/2023 0327 Gross per 24 hour  Intake 2025.15 ml  Output --  Net 2025.15 ml   Filed Weights   09/18/23 1519 09/18/23 2029 09/19/23 0857  Weight: 73.5 kg 73.6 kg 73.5 kg    Physical Examination: Body mass index is 26.96 kg/m.   General:  Average built, not in obvious distress, on room air, appears weak and deconditioned HENT:   No scleral pallor or icterus noted. Oral mucosa is moist.  Chest:   Diminished breath sounds bilaterally. .  Left chest wall port cath in place without any induration. CVS: S1 &S2 heard. No murmur.  Regular rate and rhythm. Abdomen: Soft, nontender, nondistended.  Bowel sounds are heard.   Extremities: No cyanosis, clubbing or edema.  Peripheral pulses are palpable. Psych: Alert, awake and oriented, normal mood CNS:  No cranial nerve deficits.  Power equal in all extremities.   Skin: Warm and dry.  No rashes noted.  Data Reviewed:   CBC: Recent Labs  Lab 09/12/23 1149 09/18/23 1326 09/19/23 0340  WBC 8.5 7.0 3.6*  NEUTROABS 7.3 6.2  --   HGB 10.0* 8.8* 6.8*  HCT 32.8* 29.1* 22.4*  MCV 92.9 89.0 90.3  PLT 230 282 224    Basic Metabolic Panel: Recent Labs  Lab 09/12/23 1149 09/18/23 1326 09/19/23 0340  NA 137 131* 133*  K 3.0* 3.9 3.2*  CL 101 96* 102  CO2 24 24 24   GLUCOSE 117* 108* 136*  BUN 9 10 9   CREATININE 0.48 0.71 0.65  CALCIUM 8.9 8.4* 8.0*    Liver Function Tests: Recent Labs  Lab 09/12/23 1149 09/18/23 1326  AST 34 46*  ALT 23 20  ALKPHOS 105 124  BILITOT 0.9  1.4*  PROT 6.6 6.7  ALBUMIN 2.8* 2.7*     Radiology Studies: US Abdomen Limited RUQ (LIVER/GB)  Result Date: 09/18/2023 CLINICAL DATA:  Elevated liver function tests. EXAM: ULTRASOUND ABDOMEN LIMITED RIGHT UPPER QUADRANT COMPARISON:  None Available. FINDINGS: Gallbladder: The gallbladder is contracted. No gallstones or wall thickening visualized (1.9 mm). No sonographic Murphy sign noted by sonographer. Common bile duct: Diameter: 3.1 mm Liver: Heterogeneous echogenic masslike lesions are seen within the left lobe of the liver. The largest measures approximately 5.3 cm x 4.6 cm x 3.4 cm. Diffusely increased  echogenicity of the liver parenchyma is noted. Portal vein is patent on color Doppler imaging with normal direction of blood flow towards the liver. Other: None. IMPRESSION: 1. Hepatic steatosis with multiple large heterogeneous liver lesions, concerning for the presence of hepatic metastasis. Electronically Signed   By: Aram Candela M.D.   On: 09/18/2023 21:53   DG Chest 2 View  Result Date: 09/18/2023 CLINICAL DATA:  Shortness of breath. Nonproductive cough. Suspected Sepsis EXAM: CHEST - 2 VIEW COMPARISON:  09/04/2023. FINDINGS: Redemonstration of multiple lobulated opacities throughout bilateral lungs, right more than left, compatible with patient's known history of pulmonary metastasis. No acute consolidation or lung collapse seen. Bilateral costophrenic angles are clear. Note is made of elevated right hemidiaphragm. Normal cardio-mediastinal silhouette. No acute osseous abnormalities. The soft tissues are within normal limits. Left-sided CT Port-A-Cath is again seen with its tip overlying the cavoatrial junction region. IMPRESSION: *No active cardiopulmonary disease. *Redemonstration of extensive lung opacities, compatible with metastases. Electronically Signed   By: Jules Schick M.D.   On: 09/18/2023 16:00      LOS: 1 day     Joycelyn Das, MD [facility] Available  via Epic secure chat 7am-7pm After these hours, please refer to coverage provider listed on [email or web address] 09/19/2023, 9:29 AM

## 2023-09-19 NOTE — Patient Outreach (Signed)
  Care Management  Transitions of Care Program Managed Medicaid Transitions of Care week 2/ completion of Transition of Care Care Plan  09/19/2023 Name: Denaya Hotchkiss MRN: [id number] DOB: [birth year]  Subjective: Tamberly Tinsley is a 62 y.o. year old female who is a primary care patient of [name], MD. The Care Management team was unable to reach the patient by phone to assess and address transitions of care needs. Patient is presently hospitalized at [facility] as of 09/18/23 for Sepsis.  Plan: Additional outreach attempts will be made to reach the patient enrolled in the [facility] Program (Post Inpatient/ED Visit).  Alyse Low, RN, BA, [facility], CRRN [facility] Coordinator, Transition of Care Ph # [phone]

## 2023-09-19 NOTE — Patient Outreach (Signed)
  Care Management  Transitions of Care Program Managed Medicaid Transitions of Care 30d program Week 2/completion of Care Plan  09/19/2023 Name: Rebecca Ochoa MRN: [id number] DOB: May 13, 1961  Subjective: Rebecca Ochoa is a 62 y.o. year old female who is a primary care patient of [name], MD. The Care Management team was unable to reach the patient by phone to assess and address transitions of care needs. Patient is presently hospitalized at [facility]   Plan: Additional outreach attempts will be made to reach the patient enrolled in the [facility] Program (Post Inpatient/ED Visit).  Sig

## 2023-09-19 NOTE — Progress Notes (Signed)
   09/19/23 0239  Assess: MEWS Score  Temp (!) 100.9 F (38.3 C)  BP 113/70  MAP (mmHg) 84  Pulse Rate (!) 132  Resp 16  SpO2 95 %  O2 Device Room Air  Assess: MEWS Score  MEWS Temp 1  MEWS Systolic 0  MEWS Pulse 3  MEWS RR 0  MEWS LOC 0  MEWS Score 4  MEWS Score Color Red  Assess: if the MEWS score is Yellow or Red  Were vital signs accurate and taken at a resting state? Yes  Does the patient meet 2 or more of the SIRS criteria? Yes  Does the patient have a confirmed or suspected source of infection? Yes  MEWS guidelines implemented  Yes, red  Treat  MEWS Interventions Considered administering scheduled or prn medications/treatments as ordered  Take Vital Signs  Increase Vital Sign Frequency  Red: Q1hr x2, continue Q4hrs until patient remains green for 12hrs  Escalate  MEWS: Escalate Red: Discuss with charge nurse and notify provider. Consider notifying RRT. If remains red for 2 hours consider need for higher level of care  Notify: Charge Nurse/RN  Name of Charge Nurse/RN Notified Lauren, RN  Provider Notification  Provider Name/Title Garner Nash, NP  Date Provider Notified 09/19/23  Time Provider Notified [phone]  Method of Notification Page  Notification Reason Change in status  Provider response Evaluate remotely  Date of Provider Response 09/19/23  Time of Provider Response 0253  Notify: Rapid Response  Name of Rapid Response RN Notified Mindy, RN  Date Rapid Response Notified 09/19/23  Time Rapid Response Notified 0258  Assess: SIRS CRITERIA  SIRS Temperature  0  SIRS Pulse 1  SIRS Respirations  0  SIRS WBC 0  SIRS Score Sum  1

## 2023-09-19 NOTE — Progress Notes (Signed)
   09/18/23 2033  Assess: MEWS Score  Temp 98.4 F (36.9 C)  BP 113/65  MAP (mmHg) 80  Pulse Rate (!) 118  Resp 18  SpO2 97 %  O2 Device Room Air  Assess: MEWS Score  MEWS Temp 0  MEWS Systolic 0  MEWS Pulse 2  MEWS RR 0  MEWS LOC 0  MEWS Score 2  MEWS Score Color Yellow  Assess: if the MEWS score is Yellow or Red  Were vital signs accurate and taken at a resting state? Yes  Does the patient meet 2 or more of the SIRS criteria? No  Does the patient have a confirmed or suspected source of infection? Yes  MEWS guidelines implemented  Yes, yellow  Treat  MEWS Interventions Considered administering scheduled or prn medications/treatments as ordered  Take Vital Signs  Increase Vital Sign Frequency  Yellow: Q2hr x1, continue Q4hrs until patient remains green for 12hrs  Escalate  MEWS: Escalate Yellow: Discuss with charge nurse and consider notifying provider and/or RRT  Notify: Charge Nurse/RN  Name of Charge Nurse/RN Notified Lauren, RN  Assess: SIRS CRITERIA  SIRS Temperature  0  SIRS Pulse 1  SIRS Respirations  0  SIRS WBC 0  SIRS Score Sum  1

## 2023-09-20 ENCOUNTER — Other Ambulatory Visit (HOSPITAL_COMMUNITY): Payer: Self-pay

## 2023-09-20 ENCOUNTER — Ambulatory Visit: Payer: No Typology Code available for payment source

## 2023-09-20 ENCOUNTER — Other Ambulatory Visit: Payer: No Typology Code available for payment source

## 2023-09-20 ENCOUNTER — Ambulatory Visit: Payer: No Typology Code available for payment source | Admitting: Internal Medicine

## 2023-09-20 DIAGNOSIS — C3411 Malignant neoplasm of upper lobe, right bronchus or lung: Secondary | ICD-10-CM | POA: Diagnosis not present

## 2023-09-20 DIAGNOSIS — D649 Anemia, unspecified: Secondary | ICD-10-CM

## 2023-09-20 DIAGNOSIS — A419 Sepsis, unspecified organism: Secondary | ICD-10-CM | POA: Diagnosis not present

## 2023-09-20 LAB — BASIC METABOLIC PANEL
Anion gap: 7 (ref 5–15)
BUN: 7 mg/dL — ABNORMAL LOW (ref 8–23)
CO2: 23 mmol/L (ref 22–32)
Calcium: 8 mg/dL — ABNORMAL LOW (ref 8.9–10.3)
Chloride: 105 mmol/L (ref 98–111)
Creatinine, Ser: 0.65 mg/dL (ref 0.44–1.00)
GFR, Estimated: >60 mL/min (ref >60)
Glucose, Bld: 108 mg/dL — ABNORMAL HIGH (ref 70–99)
Potassium: 3.4 mmol/L — ABNORMAL LOW (ref 3.5–5.1)
Sodium: 135 mmol/L (ref 135–145)

## 2023-09-20 LAB — DIFFERENTIAL
Abs Immature Granulocytes: 0.01 10*3/uL (ref 0.00–0.07)
Basophils Absolute: 0 10*3/uL (ref 0.0–0.1)
Basophils Relative: 0 %
Eosinophils Absolute: 0.1 10*3/uL (ref 0.0–0.5)
Eosinophils Relative: 2 %
Immature Granulocytes: 0 %
Lymphocytes Relative: 10 %
Lymphs Abs: 0.3 10*3/uL — ABNORMAL LOW (ref 0.7–4.0)
Monocytes Absolute: 0.3 10*3/uL (ref 0.1–1.0)
Monocytes Relative: 13 %
Neutro Abs: 1.9 10*3/uL (ref 1.7–7.7)
Neutrophils Relative %: 75 %

## 2023-09-20 LAB — CBC
HCT: 25.5 % — ABNORMAL LOW (ref 36.0–46.0)
Hemoglobin: 7.9 g/dL — ABNORMAL LOW (ref 12.0–15.0)
MCH: 27.5 pg (ref 26.0–34.0)
MCHC: 31 g/dL (ref 30.0–36.0)
MCV: 88.9 fL (ref 80.0–100.0)
Platelets: 220 10*3/uL (ref 150–400)
RBC: 2.87 MIL/uL — ABNORMAL LOW (ref 3.87–5.11)
RDW: 15.8 % — ABNORMAL HIGH (ref 11.5–15.5)
WBC: 2.5 10*3/uL — ABNORMAL LOW (ref 4.0–10.5)
nRBC: 0 % (ref 0.0–0.2)

## 2023-09-20 LAB — TYPE AND SCREEN
ABO/RH(D): B POS
Antibody Screen: NEGATIVE
Unit division: 0

## 2023-09-20 LAB — RESPIRATORY PANEL BY PCR

## 2023-09-20 LAB — C DIFFICILE QUICK SCREEN W PCR REFLEX
C Diff antigen: NEGATIVE
C Diff interpretation: NOT DETECTED
C Diff toxin: NEGATIVE

## 2023-09-20 LAB — BPAM RBC
Blood Product Expiration Date: 202412172359
ISSUE DATE / TIME: 202411191258
Unit Type and Rh: 7300

## 2023-09-20 LAB — MAGNESIUM: Magnesium: 2 mg/dL (ref 1.7–2.4)

## 2023-09-20 MED ORDER — SODIUM CHLORIDE 0.9 % IV SOLN: INTRAVENOUS | Status: AC

## 2023-09-20 MED ORDER — POTASSIUM CHLORIDE CRYS ER 20 MEQ PO TBCR
40.0000 meq | EXTENDED_RELEASE_TABLET | Freq: Once | ORAL | Status: AC
  Administered 2023-09-20: 40 meq via ORAL
  Filled 2023-09-20: qty 2

## 2023-09-20 MED ORDER — ALUM & MAG HYDROXIDE-SIMETH 200-200-20 MG/5ML PO SUSP
30.0000 mL | Freq: Four times a day (QID) | ORAL | Status: DC | PRN
  Administered 2023-09-20: 30 mL via ORAL
  Filled 2023-09-20: qty 30

## 2023-09-20 MED ORDER — PANTOPRAZOLE SODIUM 40 MG PO TBEC
40.0000 mg | DELAYED_RELEASE_TABLET | Freq: Two times a day (BID) | ORAL | Status: DC
  Administered 2023-09-20 – 2023-09-25 (×10): 40 mg via ORAL
  Filled 2023-09-20 (×10): qty 1

## 2023-09-20 MED ORDER — SUCRALFATE 1 GM/10ML PO SUSP
1.0000 g | Freq: Three times a day (TID) | ORAL | Status: DC
  Administered 2023-09-20 – 2023-09-25 (×20): 1 g via ORAL
  Filled 2023-09-20 (×22): qty 10

## 2023-09-20 NOTE — TOC Initial Note (Signed)
Transition of Care [facility]) - Initial/Assessment Note    Patient Details  Name: Rebecca Ochoa MRN: [id number] Date of Birth: 1961-05-04  [facility]) CM/SW Contact:    Rebecca Kass, LCSW Phone Number: 09/20/2023, 10:18 AM  Clinical Narrative:                 Pt from home with high readmission, TOC to follow for d/c needs.  Expected Discharge Plan: Home w Hospice Care Barriers to Discharge: Continued Medical Work up   Patient Goals and CMS Choice            Expected Discharge Plan and Services                                              Prior Living Arrangements/Services                       Activities of Daily Living   ADL Screening (condition at time of admission) Independently performs ADLs?: Yes (appropriate for developmental age) Is the patient deaf or have difficulty hearing?: No Does the patient have difficulty seeing, even when wearing glasses/contacts?: No Does the patient have difficulty concentrating, remembering, or making decisions?: No  Permission Sought/Granted                  Emotional Assessment              Admission diagnosis:  Sepsis (HCC) [A41.9] Sepsis, due to unspecified organism, unspecified whether acute organ dysfunction present [employer]) [A41.9] Patient Active Problem List   Diagnosis Date Noted   Goals of care, counseling/discussion 09/12/2023   Pulmonary embolism (HCC) 09/04/2023   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 [place] [address] Phone: [phone] Fax: [phone]  [place] [facility] [address] [address] Phone: [phone] Fax: [phone]  [facility] [place] - [address] [facility] [address] [place] [address] Phone: [phone] Fax: [phone]     Social Determinants of Health (SDOH) Social History: SDOH Screenings   Food Insecurity: No Food Insecurity (09/18/2023)  Housing: Low Risk  (09/18/2023)  Transportation Needs: No Transportation Needs (09/18/2023)  Utilities: Not At Risk (09/18/2023)  Depression (PHQ2-9): Low Risk  (06/04/2022)  Tobacco Use: High Risk (09/19/2023)   SDOH Interventions:     Readmission Risk Interventions     No data to display

## 2023-09-20 NOTE — Plan of Care (Signed)
  Problem: Coping: Goal: Level of anxiety will decrease Outcome: Progressing   Problem: Pain Management: Goal: General experience of comfort will improve Outcome: Progressing   Problem: Safety: Goal: Ability to remain free from injury will improve Outcome: Progressing   Problem: Skin Integrity: Goal: Risk for impaired skin integrity will decrease Outcome: Progressing

## 2023-09-20 NOTE — Progress Notes (Signed)
PROGRESS NOTE    Rebecca Ochoa  ZOX:[id number] DOB: [birth year] DOA: 09/18/2023 PCP: [name], MD   Brief Narrative: Litia Tadesse is a 62 y.o. female with a history of lung cancer, anemia, pulmonary embolism.  Patient presented secondary to fever and headache with evidence of SIRS on admission. No source identified. Patient started empirically on Ceftriaxone and azithromycin.   Assessment and Plan:  SIRS Present on admission. Patient with fever and leukopenia. No source identified. COVID-19, influenza, RSV negative. Blood cultures obtained and are negative. RVP obtained and is negative. Patient started empirically on Ceftriaxone and azithromycin. Patient with cough and diarrheal symptoms only. -Check C. Difficile and GI pathogen panel -Continue Ceftriaxone and azithromycin  Anemia of chronic illness Acute anemia Baseline hemoglobin is about 8-10. Hemoglobin of 6.8 g/dL after admission requiring transfusion of 1 unit of PRBC. Unsure if drop in hemoglobin is real or secondary to IV fluids. No sign of bleeding noted. Bilirubin slightly elevated. 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 Arbutus Ped as an outpatient. She has completed radiation and is currently on Tagrisso and has not yet started chemotherapy.  Leukopenia Driven by lymphocytopenia. Normal ANC.  Low TSH TSH of 0.179. Unclear etiology. -Check free T4   DVT prophylaxis: Eliquis Code Status:   Code Status: Full Code Family Communication: Two brothers and sister in law at bedside Disposition Plan: Discharge home likely in 1-3 days pending identification of source, and/or transition to outpatient antibiotic regimen   Consultants:  None  Procedures:  None  Antimicrobials: Ceftriaxone Azithromycin    Subjective: Patient reports overall feeling  better. Some cough and diarrhea.  Objective: BP 119/62 (BP Location: Left Arm)   Pulse (!) 107   Temp 98.4 F (36.9 C) (Oral)   Resp 16   Ht 5\' 5"  (1.651 m)   Wt 73.5 kg   SpO2 94%   BMI 26.96 kg/m   Examination:  General exam: Appears calm and comfortable Respiratory system: Clear to auscultation. Respiratory effort normal. Cardiovascular system: S1 & S2 heard, RRR. No murmurs. Gastrointestinal system: Abdomen is nondistended, soft and nontender. Normal bowel sounds heard. Central nervous system: Alert and oriented. No focal neurological deficits. Psychiatry: Judgement and insight appear normal. Mood & affect appropriate.    Data Reviewed: I have personally reviewed following labs and imaging studies  CBC Lab Results  Component Value Date   WBC 2.5 (L) 09/20/2023   RBC 2.87 (L) 09/20/2023   HGB 7.9 (L) 09/20/2023   HCT 25.5 (L) 09/20/2023   MCV 88.9 09/20/2023   MCH 27.5 09/20/2023   PLT 220 09/20/2023   MCHC 31.0 09/20/2023   RDW 15.8 (H) 09/20/2023   LYMPHSABS 0.2 (L) 09/18/2023   MONOABS 0.5 09/18/2023   EOSABS 0.0 09/18/2023   BASOSABS 0.0 09/18/2023     Last metabolic panel Lab Results  Component Value Date   NA 135 09/20/2023   K 3.4 (L) 09/20/2023   CL 105 09/20/2023   CO2 23 09/20/2023   BUN 7 (L) 09/20/2023   CREATININE 0.65 09/20/2023   GLUCOSE 108 (H) 09/20/2023   GFRNONAA >60 09/20/2023   GFRAA 95 10/12/2020   CALCIUM 8.0 (L) 09/20/2023   PROT 6.7 09/18/2023   ALBUMIN 2.7 (L) 09/18/2023   BILITOT 1.4 (H) 09/18/2023   ALKPHOS 124 09/18/2023   AST 46 (H) 09/18/2023   ALT 20 09/18/2023  ANIONGAP 7 09/20/2023    GFR: Estimated Creatinine Clearance: 73.2 mL/min (by C-G formula based on SCr of 0.65 mg/dL).  Recent Results (from the past 240 hour(s))  Culture, blood (Routine x 2)     Status: None (Preliminary result)   Collection Time: 09/18/23  1:26 PM   Specimen: BLOOD  Result Value Ref Range Status   Specimen Description   Final     BLOOD LEFT ANTECUBITAL Performed at [facility], [address], [place], [place] [address]    Special Requests   Final    BOTTLES DRAWN AEROBIC AND ANAEROBIC Blood Culture adequate volume Performed at [facility], [address], [place], [place] [address]    Culture   Final    NO GROWTH < 24 HOURS Performed at [facility], [address] [place], [place] [address]    Report Status PENDING  Incomplete  Culture, blood (Routine x 2)     Status: None (Preliminary result)   Collection Time: 09/18/23  2:24 PM   Specimen: BLOOD  Result Value Ref Range Status   Specimen Description   Final    BLOOD RIGHT ANTECUBITAL Performed at [facility] [address], [place], [place] [address]    Special Requests   Final    BOTTLES DRAWN AEROBIC AND ANAEROBIC Blood Culture adequate volume Performed at [facility], [address], [place], [place] [address]    Culture   Final    NO GROWTH < 24 HOURS Performed at [facility], [address] [place], [place] [address]    Report Status PENDING  Incomplete  Resp panel by RT-PCR (RSV, Flu A&B, Covid) Anterior Nasal Swab     Status: None   Collection Time: 09/18/23  4:36 PM   Specimen: Anterior Nasal Swab  Result Value Ref Range Status   SARS Coronavirus 2 by RT PCR NEGATIVE NEGATIVE Final    Comment: (NOTE) SARS-CoV-2 target nucleic acids are NOT DETECTED.  The SARS-CoV-2 RNA is generally detectable in upper respiratory specimens during the acute phase of infection. The lowest concentration of SARS-CoV-2 viral copies this assay can detect is 138 copies/mL. A negative result does not preclude SARS-Cov-2 infection and should not be used as the sole basis for treatment or other patient management decisions. A negative result may occur with  improper specimen collection/handling, submission of specimen other than nasopharyngeal swab, presence of viral  mutation(s) within the areas targeted by this assay, and inadequate number of viral copies(<138 copies/mL). A negative result must be combined with clinical observations, patient history, and epidemiological information. The expected result is Negative.  Fact Sheet for Patients:  BloggerCourse.com  Fact Sheet for Healthcare Providers:  SeriousBroker.it  This test is no t yet approved or cleared by the [place] FDA and  has been authorized for detection and/or diagnosis of SARS-CoV-2 by FDA under an Emergency Use Authorization (EUA). This EUA will remain  in effect (meaning this test can be used) for the duration of the COVID-19 declaration under Section 564(b)(1) of the Act, 21 U.S.C.section 360bbb-3(b)(1), unless the authorization is terminated  or revoked sooner.       Influenza A by PCR NEGATIVE NEGATIVE Final   Influenza B by PCR NEGATIVE NEGATIVE Final    Comment: (NOTE) The Xpert Xpress SARS-CoV-2/FLU/RSV plus assay is intended as an aid in the diagnosis of influenza from Nasopharyngeal swab specimens and should not be used as a sole basis for treatment.  Nasal washings and aspirates are unacceptable for Xpert Xpress SARS-CoV-2/FLU/RSV testing.  Fact Sheet for Patients: BloggerCourse.com  Fact Sheet for Healthcare Providers: SeriousBroker.it  This test is not yet approved or cleared by the [place] FDA and has been authorized for detection and/or diagnosis of SARS-CoV-2 by FDA under an Emergency Use Authorization (EUA). This EUA will remain in effect (meaning this test can be used) for the duration of the COVID-19 declaration under Section 564(b)(1) of the Act, 21 U.S.C. section 360bbb-3(b)(1), unless the authorization is terminated or revoked.     Resp Syncytial Virus by PCR NEGATIVE NEGATIVE Final    Comment: (NOTE) Fact Sheet for  Patients: BloggerCourse.com  Fact Sheet for Healthcare Providers: SeriousBroker.it  This test is not yet approved or cleared by the [place] FDA and has been authorized for detection and/or diagnosis of SARS-CoV-2 by FDA under an Emergency Use Authorization (EUA). This EUA will remain in effect (meaning this test can be used) for the duration of the COVID-19 declaration under Section 564(b)(1) of the Act, 21 U.S.C. section 360bbb-3(b)(1), unless the authorization is terminated or revoked.  Performed at [facility], [address], [place], [place] [address]       Radiology Studies: US Abdomen Limited RUQ (LIVER/GB)  Result Date: 09/18/2023 CLINICAL DATA:  Elevated liver function tests. EXAM: ULTRASOUND ABDOMEN LIMITED RIGHT UPPER QUADRANT COMPARISON:  None Available. FINDINGS: Gallbladder: The gallbladder is contracted. No gallstones or wall thickening visualized (1.9 mm). No sonographic Murphy sign noted by sonographer. Common bile duct: Diameter: 3.1 mm Liver: Heterogeneous echogenic masslike lesions are seen within the left lobe of the liver. The largest measures approximately 5.3 cm x 4.6 cm x 3.4 cm. Diffusely increased echogenicity of the liver parenchyma is noted. Portal vein is patent on color Doppler imaging with normal direction of blood flow towards the liver. Other: None. IMPRESSION: 1. Hepatic steatosis with multiple large heterogeneous liver lesions, concerning for the presence of hepatic metastasis. Electronically Signed   By: Aram Candela M.D.   On: 09/18/2023 21:53   DG Chest 2 View  Result Date: 09/18/2023 CLINICAL DATA:  Shortness of breath. Nonproductive cough. Suspected Sepsis EXAM: CHEST - 2 VIEW COMPARISON:  09/04/2023. FINDINGS: Redemonstration of multiple lobulated opacities throughout bilateral lungs, right more than left, compatible with patient's known history of pulmonary  metastasis. No acute consolidation or lung collapse seen. Bilateral costophrenic angles are clear. Note is made of elevated right hemidiaphragm. Normal cardio-mediastinal silhouette. No acute osseous abnormalities. The soft tissues are within normal limits. Left-sided CT Port-A-Cath is again seen with its tip overlying the cavoatrial junction region. IMPRESSION: *No active cardiopulmonary disease. *Redemonstration of extensive lung opacities, compatible with metastases. Electronically Signed   By: Jules Schick M.D.   On: 09/18/2023 16:00      LOS: 2 days    Jacquelin Hawking, MD [facility] 09/20/2023, 8:17 AM   If 7PM-7AM, please contact night-coverage [email or web address]

## 2023-09-20 NOTE — Plan of Care (Addendum)
C-diff stool sample and GI panel (occult too) sent to lab well before midnight.   Problem: Education: Goal: Knowledge of General Education information will improve Description: Including pain rating scale, medication(s)/side effects and non-pharmacologic comfort measures Outcome: Progressing   Problem: Health Behavior/Discharge Planning: Goal: Ability to manage health-related needs will improve Outcome: Progressing   Problem: Clinical Measurements: Goal: Ability to maintain clinical measurements within normal limits will improve Outcome: Progressing Goal: Will remain free from infection Outcome: Progressing Goal: Respiratory complications will improve Outcome: Progressing Goal: Cardiovascular complication will be avoided Outcome: Progressing   Problem: Activity: Goal: Risk for activity intolerance will decrease Outcome: Progressing   Problem: Nutrition: Goal: Adequate nutrition will be maintained Outcome: Progressing   Problem: Coping: Goal: Level of anxiety will decrease Outcome: Progressing   Problem: Elimination: Goal: Will not experience complications related to bowel motility Outcome: Progressing Goal: Will not experience complications related to urinary retention Outcome: Progressing   Problem: Pain Management: Goal: General experience of comfort will improve Outcome: Progressing   Problem: Safety: Goal: Ability to remain free from injury will improve Outcome: Progressing   Problem: Skin Integrity: Goal: Risk for impaired skin integrity will decrease Outcome: Progressing

## 2023-09-21 ENCOUNTER — Ambulatory Visit: Payer: No Typology Code available for payment source | Admitting: Adult Health

## 2023-09-21 DIAGNOSIS — C3411 Malignant neoplasm of upper lobe, right bronchus or lung: Secondary | ICD-10-CM | POA: Diagnosis not present

## 2023-09-21 DIAGNOSIS — A419 Sepsis, unspecified organism: Secondary | ICD-10-CM | POA: Diagnosis not present

## 2023-09-21 DIAGNOSIS — D649 Anemia, unspecified: Secondary | ICD-10-CM | POA: Diagnosis not present

## 2023-09-21 LAB — GASTROINTESTINAL PANEL BY PCR, STOOL (REPLACES STOOL CULTURE)

## 2023-09-21 LAB — CBC
HCT: 24.8 % — ABNORMAL LOW (ref 36.0–46.0)
Hemoglobin: 7.7 g/dL — ABNORMAL LOW (ref 12.0–15.0)
MCH: 27.5 pg (ref 26.0–34.0)
MCHC: 31 g/dL (ref 30.0–36.0)
MCV: 88.6 fL (ref 80.0–100.0)
Platelets: 205 10*3/uL (ref 150–400)
RBC: 2.8 MIL/uL — ABNORMAL LOW (ref 3.87–5.11)
RDW: 15.3 % (ref 11.5–15.5)
WBC: 3 10*3/uL — ABNORMAL LOW (ref 4.0–10.5)
nRBC: 0 % (ref 0.0–0.2)

## 2023-09-21 LAB — MAGNESIUM: Magnesium: 2 mg/dL (ref 1.7–2.4)

## 2023-09-21 LAB — BASIC METABOLIC PANEL
Anion gap: 9 (ref 5–15)
BUN: 6 mg/dL — ABNORMAL LOW (ref 8–23)
CO2: 23 mmol/L (ref 22–32)
Calcium: 8.2 mg/dL — ABNORMAL LOW (ref 8.9–10.3)
Chloride: 105 mmol/L (ref 98–111)
Creatinine, Ser: 0.59 mg/dL (ref 0.44–1.00)
GFR, Estimated: >60 mL/min (ref >60)
Glucose, Bld: 95 mg/dL (ref 70–99)
Potassium: 3.4 mmol/L — ABNORMAL LOW (ref 3.5–5.1)
Sodium: 137 mmol/L (ref 135–145)

## 2023-09-21 LAB — BILIRUBIN, FRACTIONATED(TOT/DIR/INDIR)
Bilirubin, Direct: <0.1 mg/dL (ref 0.0–0.2)
Total Bilirubin: 0.7 mg/dL (ref <1.2)

## 2023-09-21 LAB — T4, FREE: Free T4: 1.08 ng/dL (ref 0.61–1.12)

## 2023-09-21 MED ORDER — BUTALBITAL-APAP-CAFFEINE 50-325-40 MG PO TABS
1.0000 | ORAL_TABLET | Freq: Once | ORAL | Status: AC
  Administered 2023-09-21: 1 via ORAL
  Filled 2023-09-21: qty 1

## 2023-09-21 MED ORDER — METHOCARBAMOL 750 MG PO TABS
750.0000 mg | ORAL_TABLET | Freq: Four times a day (QID) | ORAL | Status: DC | PRN
  Administered 2023-09-22: 750 mg via ORAL
  Filled 2023-09-21: qty 2

## 2023-09-21 MED ORDER — POTASSIUM CHLORIDE CRYS ER 20 MEQ PO TBCR
40.0000 meq | EXTENDED_RELEASE_TABLET | Freq: Once | ORAL | Status: AC
  Administered 2023-09-21: 40 meq via ORAL
  Filled 2023-09-21: qty 2

## 2023-09-21 NOTE — Progress Notes (Signed)
PROGRESS NOTE    Rebecca Ochoa  AOZ:[id number] DOB: 12/03/1960 DOA: 09/18/2023 PCP: [name], MD   Brief Narrative: Rebecca Ochoa is a 62 y.o. female with a history of lung cancer, anemia, pulmonary embolism.  Patient presented secondary to fever and headache with evidence of SIRS on admission. No source identified. Patient started empirically on Ceftriaxone and azithromycin.   Assessment and Plan:  SIRS Present on admission. Patient with fever and leukopenia. No source identified. COVID-19, influenza, RSV negative. Blood cultures obtained and are negative. RVP obtained and is negative. C. Difficile and GI pathogen panel negative. Patient started empirically on Ceftriaxone and azithromycin. Patient with cough and diarrheal symptoms only. Overall, symptoms are improving. -Continue Ceftriaxone and azithromycin  Anemia of chronic illness Acute anemia Baseline hemoglobin is about 8-10. Hemoglobin of 6.8 g/dL after admission requiring transfusion of 1 unit of PRBC. Unsure if drop in hemoglobin is real or secondary to IV fluids. No sign of bleeding noted. Bilirubin slightly elevated. 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 Arbutus Ped as an outpatient. She has completed radiation and planned to start Tagrisso.  Leukopenia Driven by lymphocytopenia. Normal ANC.  Low TSH TSH of 0.179. Unclear etiology. -Free T4 is pending  Poor appetite Likely related to overall illness. -Encourage good PO intake -Dietitian consult   DVT prophylaxis: Eliquis Code Status:   Code Status: Full Code Family Communication: Partner at bedside Disposition Plan: Discharge home likely in 1-2 days pending transition to outpatient antibiotic regimen and improved oral intake   Consultants:  None  Procedures:   None  Antimicrobials: Ceftriaxone Azithromycin    Subjective: Continues to feel better. Soft/mushy stools. Poor appetite. No other concerns.  Objective: BP 115/71 (BP Location: Right Arm)   Pulse (!) 106   Temp 98.3 F (36.8 C) (Oral)   Resp 18   Ht 5\' 5"  (1.651 m)   Wt 73.5 kg   SpO2 96%   BMI 26.96 kg/m   Examination:  General exam: Appears calm and comfortable Respiratory system: Clear to auscultation. Respiratory effort normal. Cardiovascular system: S1 & S2 heard, RRR. No murmurs. Gastrointestinal system: Abdomen is nondistended, soft and nontender. Normal bowel sounds heard. Central nervous system: Alert and oriented. No focal neurological deficits. Musculoskeletal: No edema. No calf tenderness Psychiatry: Judgement and insight appear normal. Mood & affect appropriate.    Data Reviewed: I have personally reviewed following labs and imaging studies  CBC Lab Results  Component Value Date   WBC 3.0 (L) 09/21/2023   RBC 2.80 (L) 09/21/2023   HGB 7.7 (L) 09/21/2023   HCT 24.8 (L) 09/21/2023   MCV 88.6 09/21/2023   MCH 27.5 09/21/2023   PLT 205 09/21/2023   MCHC 31.0 09/21/2023   RDW 15.3 09/21/2023   LYMPHSABS 0.3 (L) 09/20/2023   MONOABS 0.3 09/20/2023   EOSABS 0.1 09/20/2023   BASOSABS 0.0 09/20/2023     Last metabolic panel Lab Results  Component Value Date   NA 137 09/21/2023   K 3.4 (L) 09/21/2023   CL 105 09/21/2023   CO2 23 09/21/2023   BUN 6 (L) 09/21/2023   CREATININE 0.59 09/21/2023   GLUCOSE 95 09/21/2023   GFRNONAA >60 09/21/2023   GFRAA 95 10/12/2020   CALCIUM 8.2 (L) 09/21/2023   PROT 6.7 09/18/2023   ALBUMIN 2.7 (L) 09/18/2023   BILITOT 0.7 09/21/2023   ALKPHOS 124  09/18/2023   AST 46 (H) 09/18/2023   ALT 20 09/18/2023   ANIONGAP 9 09/21/2023    GFR: Estimated Creatinine Clearance: 73.2 mL/min (by C-G formula based on SCr of 0.59 mg/dL).  Recent Results (from the past 240 hour(s))  Culture, blood (Routine x 2)      Status: None (Preliminary result)   Collection Time: 09/18/23  1:26 PM   Specimen: BLOOD  Result Value Ref Range Status   Specimen Description   Final    BLOOD LEFT ANTECUBITAL Performed at [facility], [address], [place], [place] [address]    Special Requests   Final    BOTTLES DRAWN AEROBIC AND ANAEROBIC Blood Culture adequate volume Performed at [facility], [address] [place], [place] [address]    Culture   Final    NO GROWTH 3 DAYS Performed at [facility], [address], [place], [place] [address]    Report Status PENDING  Incomplete  Culture, blood (Routine x 2)     Status: None (Preliminary result)   Collection Time: 09/18/23  2:24 PM   Specimen: BLOOD  Result Value Ref Range Status   Specimen Description   Final    BLOOD RIGHT ANTECUBITAL Performed at [facility], [address] [place], [place] [address]    Special Requests   Final    BOTTLES DRAWN AEROBIC AND ANAEROBIC Blood Culture adequate volume Performed at [facility], [address], [place], [place] [address]    Culture   Final    NO GROWTH 3 DAYS Performed at [facility], [address], [place], [place] [address]    Report Status PENDING  Incomplete  Resp panel by RT-PCR (RSV, Flu A&B, Covid) Anterior Nasal Swab     Status: None   Collection Time: 09/18/23  4:36 PM   Specimen: Anterior Nasal Swab  Result Value Ref Range Status   SARS Coronavirus 2 by RT PCR NEGATIVE NEGATIVE Final    Comment: (NOTE) SARS-CoV-2 target nucleic acids are NOT DETECTED.  The SARS-CoV-2 RNA is generally detectable in upper respiratory specimens during the acute phase of infection. The lowest concentration of SARS-CoV-2 viral copies this assay can detect is 138 copies/mL. A negative result does not preclude SARS-Cov-2 infection and should not be used as the sole basis for treatment or other patient management decisions.  A negative result may occur with  improper specimen collection/handling, submission of specimen other than nasopharyngeal swab, presence of viral mutation(s) within the areas targeted by this assay, and inadequate number of viral copies(<138 copies/mL). A negative result must be combined with clinical observations, patient history, and epidemiological information. The expected result is Negative.  Fact Sheet for Patients:  BloggerCourse.com  Fact Sheet for Healthcare Providers:  SeriousBroker.it  This test is no t yet approved or cleared by the [place] FDA and  has been authorized for detection and/or diagnosis of SARS-CoV-2 by FDA under an Emergency Use Authorization (EUA). This EUA will remain  in effect (meaning this test can be used) for the duration of the COVID-19 declaration under Section 564(b)(1) of the Act, 21 U.S.C.section 360bbb-3(b)(1), unless the authorization is terminated  or revoked sooner.       Influenza A by PCR NEGATIVE NEGATIVE Final   Influenza B by PCR NEGATIVE NEGATIVE Final    Comment: (NOTE) The Xpert Xpress SARS-CoV-2/FLU/RSV plus assay is intended as an aid in the diagnosis of influenza from Nasopharyngeal swab  specimens and should not be used as a sole basis for treatment. Nasal washings and aspirates are unacceptable for Xpert Xpress SARS-CoV-2/FLU/RSV testing.  Fact Sheet for Patients: BloggerCourse.com  Fact Sheet for Healthcare Providers: SeriousBroker.it  This test is not yet approved or cleared by the [place] FDA and has been authorized for detection and/or diagnosis of SARS-CoV-2 by FDA under an Emergency Use Authorization (EUA). This EUA will remain in effect (meaning this test can be used) for the duration of the COVID-19 declaration under Section 564(b)(1) of the Act, 21 U.S.C. section 360bbb-3(b)(1), unless the authorization  is terminated or revoked.     Resp Syncytial Virus by PCR NEGATIVE NEGATIVE Final    Comment: (NOTE) Fact Sheet for Patients: BloggerCourse.com  Fact Sheet for Healthcare Providers: SeriousBroker.it  This test is not yet approved or cleared by the [place] FDA and has been authorized for detection and/or diagnosis of SARS-CoV-2 by FDA under an Emergency Use Authorization (EUA). This EUA will remain in effect (meaning this test can be used) for the duration of the COVID-19 declaration under Section 564(b)(1) of the Act, 21 U.S.C. section 360bbb-3(b)(1), unless the authorization is terminated or revoked.  Performed at [facility], [address] [place], [place] [address]   Respiratory (~20 pathogens) panel by PCR     Status: None   Collection Time: 09/20/23  8:21 AM   Specimen: Nasopharyngeal Swab; Respiratory  Result Value Ref Range Status   Adenovirus NOT DETECTED NOT DETECTED Final   Coronavirus 229E NOT DETECTED NOT DETECTED Final    Comment: (NOTE) The Coronavirus on the Respiratory Panel, DOES NOT test for the novel  Coronavirus (2019 nCoV)    Coronavirus HKU1 NOT DETECTED NOT DETECTED Final   Coronavirus NL63 NOT DETECTED NOT DETECTED Final   Coronavirus OC43 NOT DETECTED NOT DETECTED Final   Metapneumovirus NOT DETECTED NOT DETECTED Final   Rhinovirus / Enterovirus NOT DETECTED NOT DETECTED Final   Influenza A NOT DETECTED NOT DETECTED Final   Influenza B NOT DETECTED NOT DETECTED Final   Parainfluenza Virus 1 NOT DETECTED NOT DETECTED Final   Parainfluenza Virus 2 NOT DETECTED NOT DETECTED Final   Parainfluenza Virus 3 NOT DETECTED NOT DETECTED Final   Parainfluenza Virus 4 NOT DETECTED NOT DETECTED Final   Respiratory Syncytial Virus NOT DETECTED NOT DETECTED Final   Bordetella pertussis NOT DETECTED NOT DETECTED Final   Bordetella Parapertussis NOT DETECTED NOT DETECTED Final    Chlamydophila pneumoniae NOT DETECTED NOT DETECTED Final   Mycoplasma pneumoniae NOT DETECTED NOT DETECTED Final    Comment: Performed at [facility], [address], [place], [place] [address]  C Difficile Quick Screen w PCR reflex     Status: None   Collection Time: 09/20/23  5:21 PM   Specimen: STOOL  Result Value Ref Range Status   C Diff antigen NEGATIVE NEGATIVE Final   C Diff toxin NEGATIVE NEGATIVE Final   C Diff interpretation No C. difficile detected.  Final    Comment: Performed at [facility], [address] [place], [place] [address]  Gastrointestinal Panel by PCR , Stool     Status: None   Collection Time: 09/20/23  5:22 PM   Specimen: Stool  Result Value Ref Range Status   Campylobacter species NOT DETECTED NOT DETECTED Final   Plesimonas shigelloides NOT DETECTED NOT DETECTED Final   Salmonella species NOT DETECTED NOT DETECTED Final   Yersinia enterocolitica NOT DETECTED NOT DETECTED Final  Vibrio species NOT DETECTED NOT DETECTED Final   Vibrio cholerae NOT DETECTED NOT DETECTED Final   Enteroaggregative E coli (EAEC) NOT DETECTED NOT DETECTED Final   Enteropathogenic E coli (EPEC) NOT DETECTED NOT DETECTED Final   Enterotoxigenic E coli (ETEC) NOT DETECTED NOT DETECTED Final   Shiga like toxin producing E coli (STEC) NOT DETECTED NOT DETECTED Final   Shigella/Enteroinvasive E coli (EIEC) NOT DETECTED NOT DETECTED Final   Cryptosporidium NOT DETECTED NOT DETECTED Final   Cyclospora cayetanensis NOT DETECTED NOT DETECTED Final   Entamoeba histolytica NOT DETECTED NOT DETECTED Final   Giardia lamblia NOT DETECTED NOT DETECTED Final   Adenovirus F40/41 NOT DETECTED NOT DETECTED Final   Astrovirus NOT DETECTED NOT DETECTED Final   Norovirus GI/GII NOT DETECTED NOT DETECTED Final   Rotavirus A NOT DETECTED NOT DETECTED Final   Sapovirus (I, II, IV, and V) NOT DETECTED NOT DETECTED Final    Comment: Performed at [facility], [address], [place], [place] [address]      Radiology Studies: No results found.    LOS: 3 days    Jacquelin Hawking, MD [facility] 09/21/2023, 1:38 PM   If 7PM-7AM, please contact night-coverage [email or web address]

## 2023-09-21 NOTE — Plan of Care (Signed)
  Problem: Coping: Goal: Level of anxiety will decrease Outcome: Progressing   Problem: Pain Management: Goal: General experience of comfort will improve Outcome: Progressing   Problem: Safety: Goal: Ability to remain free from injury will improve Outcome: Progressing   Problem: Skin Integrity: Goal: Risk for impaired skin integrity will decrease Outcome: Progressing

## 2023-09-22 ENCOUNTER — Other Ambulatory Visit: Payer: Self-pay | Admitting: Physician Assistant

## 2023-09-22 ENCOUNTER — Inpatient Hospital Stay (HOSPITAL_COMMUNITY): Payer: No Typology Code available for payment source

## 2023-09-22 ENCOUNTER — Other Ambulatory Visit (HOSPITAL_COMMUNITY): Payer: Self-pay

## 2023-09-22 ENCOUNTER — Telehealth: Payer: Self-pay | Admitting: Pharmacy Technician

## 2023-09-22 DIAGNOSIS — A419 Sepsis, unspecified organism: Secondary | ICD-10-CM | POA: Diagnosis not present

## 2023-09-22 DIAGNOSIS — E43 Unspecified severe protein-calorie malnutrition: Secondary | ICD-10-CM

## 2023-09-22 DIAGNOSIS — R519 Headache, unspecified: Secondary | ICD-10-CM | POA: Diagnosis not present

## 2023-09-22 LAB — CBC
HCT: 25.8 % — ABNORMAL LOW (ref 36.0–46.0)
Hemoglobin: 7.8 g/dL — ABNORMAL LOW (ref 12.0–15.0)
MCH: 27.4 pg (ref 26.0–34.0)
MCHC: 30.2 g/dL (ref 30.0–36.0)
MCV: 90.5 fL (ref 80.0–100.0)
Platelets: 217 10*3/uL (ref 150–400)
RBC: 2.85 MIL/uL — ABNORMAL LOW (ref 3.87–5.11)
RDW: 15.2 % (ref 11.5–15.5)
WBC: 3.3 10*3/uL — ABNORMAL LOW (ref 4.0–10.5)
nRBC: 0 % (ref 0.0–0.2)

## 2023-09-22 LAB — RETICULOCYTES
Immature Retic Fract: 16 % — ABNORMAL HIGH (ref 2.3–15.9)
RBC.: 2.81 MIL/uL — ABNORMAL LOW (ref 3.87–5.11)
Retic Count, Absolute: 36.8 10*3/uL (ref 19.0–186.0)
Retic Ct Pct: 1.3 % (ref 0.4–3.1)

## 2023-09-22 MED ORDER — GADOBUTROL 1 MMOL/ML IV SOLN
7.0000 mL | Freq: Once | INTRAVENOUS | Status: AC | PRN
  Administered 2023-09-22: 7 mL via INTRAVENOUS

## 2023-09-22 MED ORDER — PROSOURCE PLUS PO LIQD
30.0000 mL | Freq: Three times a day (TID) | ORAL | Status: DC
  Administered 2023-09-22 – 2023-09-23 (×5): 30 mL via ORAL
  Filled 2023-09-22 (×14): qty 30

## 2023-09-22 MED ORDER — LORAZEPAM 2 MG/ML IJ SOLN
1.0000 mg | Freq: Once | INTRAMUSCULAR | Status: AC | PRN
  Administered 2023-09-22: 1 mg via INTRAVENOUS
  Filled 2023-09-22: qty 1

## 2023-09-22 MED ORDER — DEXAMETHASONE 4 MG PO TABS
4.0000 mg | ORAL_TABLET | Freq: Three times a day (TID) | ORAL | Status: DC
  Administered 2023-09-22 – 2023-09-25 (×9): 4 mg via ORAL
  Filled 2023-09-22 (×9): qty 1

## 2023-09-22 MED ORDER — BUTALBITAL-APAP-CAFFEINE 50-325-40 MG PO TABS
1.0000 | ORAL_TABLET | Freq: Once | ORAL | Status: AC
  Administered 2023-09-22: 1 via ORAL
  Filled 2023-09-22: qty 1

## 2023-09-22 NOTE — Plan of Care (Signed)
  Problem: Education: Goal: Knowledge of General Education information will improve Description: Including pain rating scale, medication(s)/side effects and non-pharmacologic comfort measures Outcome: Progressing   Problem: Health Behavior/Discharge Planning: Goal: Ability to manage health-related needs will improve Outcome: Progressing   Problem: Clinical Measurements: Goal: Ability to maintain clinical measurements within normal limits will improve Outcome: Progressing Goal: Will remain free from infection Outcome: Progressing Goal: Respiratory complications will improve Outcome: Progressing Goal: Cardiovascular complication will be avoided Outcome: Progressing   Problem: Activity: Goal: Risk for activity intolerance will decrease Outcome: Progressing   Problem: Nutrition: Goal: Adequate nutrition will be maintained Outcome: Progressing   Problem: Coping: Goal: Level of anxiety will decrease Outcome: Progressing   Problem: Elimination: Goal: Will not experience complications related to bowel motility Outcome: Progressing Goal: Will not experience complications related to urinary retention Outcome: Progressing   Problem: Pain Management: Goal: General experience of comfort will improve Outcome: Progressing   Problem: Safety: Goal: Ability to remain free from injury will improve Outcome: Progressing   Problem: Skin Integrity: Goal: Risk for impaired skin integrity will decrease Outcome: Progressing

## 2023-09-22 NOTE — Progress Notes (Signed)
Initial Nutrition Assessment  DOCUMENTATION CODES:   Severe malnutrition in context of chronic illness  INTERVENTION:   -Prosource Plus PO QID, each provides 100 kcals and 15g protein  -Placed "High Calorie, High Protein" handout in AVS  -Will provide unflavored protein supplement options in AVS  NUTRITION DIAGNOSIS:   Severe Malnutrition related to chronic illness, cancer and cancer related treatments as evidenced by moderate fat depletion, mild muscle depletion, energy intake < or equal to 75% for > or equal to 1 month, percent weight loss.  GOAL:   Patient will meet greater than or equal to 90% of their needs  MONITOR:   PO intake, Supplement acceptance, Weight trends, I & O's, Labs  REASON FOR ASSESSMENT:   Consult Assessment of nutrition requirement/status  ASSESSMENT:   62 y.o. female with a history of lung cancer, anemia, pulmonary embolism.  Patient presented secondary to fever and headache with evidence of SIRS on admission.  Patient in room, spouse at bedside. Pt has not been able to eat much since starting radiation earlier this month. Pt reports at times food getting stuck lower in her esophagus and now she is unable to eat large volumes of food, gets full very quickly. Takes a few sips of Ensure and feels too full to eat meals or continue supplement. She is open to trying Prosource which is only 30 ml. We also discussed getting an unflavored protein powder to add to foods to maximize kcals and protein with volumes she is already eating. Will add High Calorie, High Protein handout for ideas on how to increase calories as well without high volume shakes. Pt does not want to take vitamins at this time while she is undergoing treatment. For breakfast she only ate cereal.   Weight patient on bed scale: 160.9 lbs Per weight records, pt has lost 18 lbs since 10/8 (10% wt loss x 1.5 months, significant for time frame).  Medications: Folic acid, Carafate, Zofran  Labs  reviewed: Low K   NUTRITION - FOCUSED PHYSICAL EXAM:  Flowsheet Row Most Recent Value  Orbital Region Moderate depletion  Upper Arm Region Mild depletion  Thoracic and Lumbar Region Unable to assess  Buccal Region Moderate depletion  Temple Region Mild depletion  Clavicle Bone Region Mild depletion  Clavicle and Acromion Bone Region Mild depletion  Scapular Bone Region Unable to assess  Dorsal Hand No depletion  Patellar Region No depletion  Anterior Thigh Region No depletion  Posterior Calf Region No depletion  Edema (RD Assessment) None  Hair Reviewed  [thinning]  Eyes Reviewed  Mouth Reviewed  [missing some teeth]  Skin Reviewed       Diet Order:   Diet Order             Diet regular Room service appropriate? Yes; Fluid consistency: Thin  Diet effective now                   EDUCATION NEEDS:   Education needs have been addressed  Skin:  Skin Assessment: Reviewed RN Assessment  Last BM:  11/21  Height:   Ht Readings from Last 1 Encounters:  09/19/23 5\' 5"  (1.651 m)    Weight:   Wt Readings from Last 1 Encounters:  09/19/23 73.5 kg    BMI:  Body mass index is 26.96 kg/m.  Estimated Nutritional Needs:   Kcal:  2200-2400  Protein:  100-110g  Fluid:  2.2L/day  Tilda Franco, MS, RD, LDN Inpatient Clinical Dietitian Contact information available via Amion

## 2023-09-22 NOTE — Progress Notes (Signed)
   09/21/23 2105  Assess: MEWS Score  Temp 99.8 F (37.7 C)  BP 132/77  MAP (mmHg) 93  Pulse Rate (!) 126  Resp (!) 24  SpO2 94 %  Assess: MEWS Score  MEWS Temp 0  MEWS Systolic 0  MEWS Pulse 2  MEWS RR 1  MEWS LOC 0  MEWS Score 3  MEWS Score Color Yellow  Assess: if the MEWS score is Yellow or Red  Were vital signs accurate and taken at a resting state? Yes  Does the patient meet 2 or more of the SIRS criteria? Yes  Does the patient have a confirmed or suspected source of infection? Yes  MEWS guidelines implemented  Yes, yellow  Treat  MEWS Interventions Considered administering scheduled or prn medications/treatments as ordered  Take Vital Signs  Increase Vital Sign Frequency  Yellow: Q2hr x1, continue Q4hrs until patient remains green for 12hrs  Escalate  MEWS: Escalate Yellow: Discuss with charge nurse and consider notifying provider and/or RRT  Notify: Charge Nurse/RN  Name of Charge Nurse/RN Notified Njang, Ian Malkin, RN  Provider Notification  Provider Name/Title A.Virgel Manifold, NP  Date Provider Notified 09/21/23  Time Provider Notified 2257  Method of Notification Page (secure chat)  Notification Reason Change in status (Yellow Mews)  Provider response No new orders (PRNs given)  Date of Provider Response 09/21/23  Time of Provider Response 2300  Assess: SIRS CRITERIA  SIRS Temperature  0  SIRS Pulse 1  SIRS Respirations  1  SIRS WBC 0  SIRS Score Sum  2

## 2023-09-22 NOTE — Plan of Care (Signed)

## 2023-09-22 NOTE — Discharge Instructions (Signed)
High-Calorie, High-Protein Nutrition Therapy (2021) A high-calorie, high-protein diet has been recommended to you. Your registered dietitian nutritionist (RDN) may have recommended this diet because you are having difficulty eating enough calories throughout the day, you have lost weight, and/or you need to add protein to your diet. Sometimes you may not feel like eating, even if you know the importance of good nutrition. The recommendations in this handout can help you with the following: Regaining your strength and energy Keeping your body healthy Healing and recovering from surgery or illness and fighting infection Tips: Schedule Your Meals and Snacks Several small meals and snacks are often better tolerated and digested than large meals. Strategies Plan to eat 3 meals and 3 snacks daily. Experiment with timing meals to find out when you have a larger appetite. Appetite may be greatest in the morning after not eating all night so you may prefer to eat your larger meals and snacks in the morning and at lunch. Breakfast-type foods are often better tolerated so eat foods such as eggs, pancakes, waffles and cereal for any meal or snack. Carry snacks with you so you are prepared to eat every 2 to 3 hours. Determine what works best for you if your body's cues for feeling hungry or full are not working. Eat a small meal or snack even if you don't feel hungry. Set a timer to remind you when it is time to eat. Take a walk before you eat (with health care provider's approval). Light or moderate physical activity can help you maintain muscle and increase your appetite. Make Eating Enjoyable Taking steps to make the experience enjoyable may help to increase your interest in eating and improve your appetite. Strategies: Eat with others whenever possible. Include your favorite foods to make meals more enjoyable. Try new foods. Save your beverage for the end of the meal so that you have more room for  food before you get full. Add Calories to Your Meals and Snacks Try adding calorie-dense foods so that each bite provides more nutrition. Strategies Drink milk, chocolate milk, soy milk, or smoothies instead of low-calorie beverages such as diet drinks or water. Cook with milk or soy milk instead of water when making dishes such as hot cereal, cocoa, or pudding. Add jelly, jam, honey, butter or margarine to bread and crackers. Add jam or fruit to ice cream and as a topping over cake. Mix dried fruit, nuts, granola, honey, or dry cereal with yogurt or hot cereals. Enjoy snacks such as milkshakes, smoothies, pudding, ice cream, or custard. Blend a fruit smoothie of a banana, frozen berries, milk or soy milk, and 1 tablespoon nonfat powdered milk or protein powder. Add Protein to Your Meals and Snacks Choose at least one protein food at each meal and snack to increase your daily intake. Strategies Add  cup nonfat dry milk powder or protein powder to make a high-protein milk to drink or to use in recipes that call for milk. Vanilla or peppermint extract or unsweetened cocoa powder could help to boost the flavor. Add hard-cooked eggs, leftover meat, grated cheese, canned beans or tofu to noodles, rice, salads, sandwiches, soups, casseroles, pasta, tuna and other mixed dishes. Add powdered milk or protein powder to hot cereals, meatloaf, casseroles, scrambled eggs, sauces, cream soups, and shakes. Add beans and lentils to salads, soups, casseroles, and vegetable dishes. Eat cottage cheese or yogurt, especially Greek yogurt, with fruit as a snack or dessert. Eat peanut or other nut butters on crackers, bread, toast,  waffles, apples, bananas or celery sticks. Add it to milkshakes, smoothies, or desserts. Consider a ready-made protein shake. Your RDN will make recommendations. Add Fats to Your Meals and Snacks Try adding fats to your meals and snacks. Fat provides more calories in fewer bites than  carbohydrate or protein and adds flavors to your foods. Strategies Snack on nuts and seeds or add them to foods like salads, pasta, cereals, yogurt, and ice cream.  Saut or stir-fry vegetables, meats, chicken, fish or tofu in olive or canola oil.  Add olive oil, other vegetable oils, butter or margarine to soups, vegetables, potatoes, cooked cereal, rice, pasta, bread, crackers, pancakes, or waffles. Snack on olives or add to pasta, pizza, or salad. Add avocado or guacamole to your salads, sandwiches, and other entrees. Include fatty fish such as salmon in your weekly meal plan. For general food safety tips, especially for clients with immunocompromised conditions, ask your RDN for the Food Safety Nutrition Therapy handout. Small Meal and Snack Ideas These snacks and meals are recommended when you have to eat but aren't necessarily hungry.  They are good choices because they are high in protein and high in calories.  2 graham crackers 2 tablespoons peanut or other nut butter 1 cup milk 2 slices whole wheat toast topped with:  avocado, mashed Seasoning of your choice   cup Greek yogurt  cup fruit  cup granola 2 deviled egg halves 5 whole wheat crackers  1 cup cream of tomato soup  grilled cheese sandwich 1 toasted waffle topped with: 2 tablespoons peanut or nut butter 1 tablespoon jam  Trail mix made with:  cup nuts  cup dried fruit  cup cold cereal, any variety  cup oatmeal or cream of wheat cereal 1 tablespoon peanut or nut butter  cup diced fruit   High-Calorie, High-Protein Sample 1-Day Menu View Nutrient Info Breakfast 1 egg, scrambled 1 ounce cheddar cheese 1 English muffin, whole wheat 1 tablespoon margarine 1 tablespoon jam  cup orange juice, fortified with calcium and vitamin D  Morning Snack 1 tablespoon peanut butter 1 banana 1 cup 1% milk  Lunch Tuna salad sandwich made with: 2 slices bread, whole wheat 3 ounces tuna mixed with: 1 tablespoon  mayonnaise  cup pudding  Afternoon Snack  cup hummus  cup carrots 1 pita  Evening Meal Enchilada casserole made with: 2 corn tortillas 3 ounces ground beef, cooked  cup black beans, cooked  cup corn, cooked 1 ounce grated cheddar cheese  cup enchilada sauce  avocado, sliced, topping for enchilada 1 tablespoon sour cream, topping for enchilada Salad:  cup lettuce, shredded  cup tomatoes, chopped, for salad 1 tablespoon olive oil and vinegar dressing, for salad  Evening Snack  cup Greek yogurt  cup blueberries  cup granola     Try unflavored protein powders to add to food to increase calories and protein: -Unjury High Whey unflavored powder -Isopure unflavored whey protein -Orgain organic protein powder, unsweetened version -Nestle makes Benecalorie and Beneprotein

## 2023-09-22 NOTE — Telephone Encounter (Signed)
Oral Oncology Patient Advocate Encounter  Received notice that [facility] pharmacy is getting a reject that the medication requires prior authorization. I followed up with a pharmacist, Amantha, to discuss. I informed the pharmacist of the PA case [phone] and approval dates 09/13/23-09/12/24. She added this to the case and is escalating the issue to the main PA and benefits team at [facility].  Jinger Neighbors, CPhT-Adv Oncology Pharmacy Patient Advocate [facility] Direct Number: [phone]  Fax: [phone]

## 2023-09-22 NOTE — Progress Notes (Signed)
PROGRESS NOTE    Rebecca Ochoa  ZOX:[id number] DOB: 62/28/1962 DOA: 09/18/2023 PCP: [name], MD   Brief Narrative: Rebecca Ochoa is a 62 y.o. female with a history of lung cancer, anemia, pulmonary embolism.  Patient presented secondary to fever and headache with evidence of SIRS on admission. No source identified. Patient started empirically on Ceftriaxone and azithromycin and is improving. During hospitalization, patient developed persistent headache. 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   Assessment and Plan:  SIRS Present on admission. Patient with fever and leukopenia. No source identified. COVID-19, influenza, RSV negative. Blood cultures obtained and are negative. RVP obtained and is negative. C. Difficile and GI pathogen panel negative. Patient started empirically on Ceftriaxone and azithromycin. Patient with cough and diarrheal symptoms only. Overall, symptoms are improving. -Continue Ceftriaxone and azithromycin  Anemia of chronic illness Acute anemia Baseline hemoglobin is about 8-10. Hemoglobin of 6.8 g/dL after admission requiring transfusion of 1 unit of PRBC. Unsure if drop in hemoglobin is real or secondary to IV fluids. No sign of bleeding noted. Bilirubin slightly elevated but resolved. Complicated by Eliquis use. Hemoglobin stable.  History of pulmonary embolism PE diagnosed on 11/4. Patient has been on Eliquis for management. Repeat CTA chest on 11/12 showed interval partial clearing. 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 Arbutus Ped as an outpatient. She has completed radiation and planned to start Tagrisso.  Leukopenia Driven by lymphocytopenia. Normal ANC.  Low TSH TSH of 0.179. Unclear etiology. Free T4 normal. Consistent with subclinical hyperthyroidism. -Obtain T3  Headache Concern in  setting of known metastatic cancer. MRI brain obtained and is significant for two foci of hemorrhage with surrounding edema. Concerning for brain metastasis but cannot rule out acute bleeding. Results discussed with patient, partner and son at bedside. Discussed with neurology with recommendations for non-contrast CT head. -Obtain stat CT head without contrast -Hold Eliquis  Poor appetite Severe malnutrition Likely related to overall illness. -Dietitian recommendations (11/22): Prosource Plus PO QID, each provides 100 kcals and 15g protein Placed "High Calorie, High Protein" handout in AVS Will provide unflavored protein supplement options in AVS   DVT prophylaxis: Eliquis Code Status:   Code Status: Full Code Family Communication: Partner at bedside Disposition Plan: Discharge home likely in 1-2 days pending transition to outpatient antibiotic regimen and improved oral intake   Consultants:  None  Procedures:  None  Antimicrobials: Ceftriaxone Azithromycin    Subjective: Eating and drinking a bit better. Still with headache.  Objective: BP 124/73 (BP Location: Left Arm)   Pulse (!) 113   Temp 98.6 F (37 C) (Oral)   Resp 19   Ht 5\' 5"  (1.651 m)   Wt 73.5 kg   SpO2 97%   BMI 26.96 kg/m   Examination:  General exam: Appears calm and comfortable Respiratory system: Clear to auscultation. Respiratory effort normal. Cardiovascular system: S1 & S2 heard, RRR. No murmurs. Gastrointestinal system: Abdomen is nondistended, soft and nontender.  Normal bowel sounds heard. Central nervous system: Alert and oriented. No focal neurological deficits. Musculoskeletal: No edema. No calf tenderness Psychiatry: Judgement and insight appear normal. Mood & affect appropriate.     Data Reviewed: I have personally reviewed following labs and imaging studies  CBC Lab Results  Component Value Date   WBC 3.3 (L) 09/22/2023   RBC 2.85 (L) 09/22/2023   RBC 2.81 (L) 09/22/2023  HGB 7.8 (L) 09/22/2023   HCT 25.8 (L) 09/22/2023   MCV 90.5 09/22/2023   MCH 27.4 09/22/2023   PLT 217 09/22/2023   MCHC 30.2 09/22/2023   RDW 15.2 09/22/2023   LYMPHSABS 0.3 (L) 09/20/2023   MONOABS 0.3 09/20/2023   EOSABS 0.1 09/20/2023   BASOSABS 0.0 09/20/2023     Last metabolic panel Lab Results  Component Value Date   NA 137 09/21/2023   K 3.4 (L) 09/21/2023   CL 105 09/21/2023   CO2 23 09/21/2023   BUN 6 (L) 09/21/2023   CREATININE 0.59 09/21/2023   GLUCOSE 95 09/21/2023   GFRNONAA >60 09/21/2023   GFRAA 95 10/12/2020   CALCIUM 8.2 (L) 09/21/2023   PROT 6.7 09/18/2023   ALBUMIN 2.7 (L) 09/18/2023   BILITOT 0.7 09/21/2023   ALKPHOS 124 09/18/2023   AST 46 (H) 09/18/2023   ALT 20 09/18/2023   ANIONGAP 9 09/21/2023    GFR: Estimated Creatinine Clearance: 73.2 mL/min (by C-G formula based on SCr of 0.59 mg/dL).  Recent Results (from the past 240 hour(s))  Culture, blood (Routine x 2)     Status: None (Preliminary result)   Collection Time: 09/18/23  1:26 PM   Specimen: BLOOD  Result Value Ref Range Status   Specimen Description   Final    BLOOD LEFT ANTECUBITAL Performed at [facility], [address] [place], [place] [address]    Special Requests   Final    BOTTLES DRAWN AEROBIC AND ANAEROBIC Blood Culture adequate volume Performed at [facility], [address] [place], [place] [address]    Culture   Final    NO GROWTH 4 DAYS Performed at [facility], [address] [place], [place] [address]    Report Status PENDING  Incomplete  Culture, blood (Routine x 2)     Status: None (Preliminary result)   Collection Time: 09/18/23  2:24 PM   Specimen: BLOOD  Result Value Ref Range Status   Specimen Description   Final    BLOOD RIGHT ANTECUBITAL Performed at [facility], [address], [place], [place] [address]    Special Requests   Final    BOTTLES DRAWN AEROBIC AND ANAEROBIC Blood  Culture adequate volume Performed at [facility], [address], [place], [place] [address]    Culture   Final    NO GROWTH 4 DAYS Performed at [facility], [address], [place], [place] [address]    Report Status PENDING  Incomplete  Resp panel by RT-PCR (RSV, Flu A&B, Covid) Anterior Nasal Swab     Status: None   Collection Time: 09/18/23  4:36 PM   Specimen: Anterior Nasal Swab  Result Value Ref Range Status   SARS Coronavirus 2 by RT PCR NEGATIVE NEGATIVE Final    Comment: (NOTE) SARS-CoV-2 target nucleic acids are NOT DETECTED.  The SARS-CoV-2 RNA is generally detectable in upper respiratory specimens during the acute phase of infection. The lowest concentration of SARS-CoV-2 viral copies this assay can detect is 138 copies/mL. A negative result does not preclude SARS-Cov-2 infection and should not be used as the sole basis for treatment or other patient management decisions. A negative result may occur with  improper specimen collection/handling, submission of specimen other than nasopharyngeal swab, presence of viral mutation(s) within the areas targeted by this assay, and inadequate number of viral copies(<138 copies/mL). A negative result must be combined with clinical observations, patient history,  and epidemiological information. The expected result is Negative.  Fact Sheet for Patients:  BloggerCourse.com  Fact Sheet for Healthcare Providers:  SeriousBroker.it  This test is no t yet approved or cleared by the [place] FDA and  has been authorized for detection and/or diagnosis of SARS-CoV-2 by FDA under an Emergency Use Authorization (EUA). This EUA will remain  in effect (meaning this test can be used) for the duration of the COVID-19 declaration under Section 564(b)(1) of the Act, 21 U.S.C.section 360bbb-3(b)(1), unless the authorization is terminated  or revoked sooner.        Influenza A by PCR NEGATIVE NEGATIVE Final   Influenza B by PCR NEGATIVE NEGATIVE Final    Comment: (NOTE) The Xpert Xpress SARS-CoV-2/FLU/RSV plus assay is intended as an aid in the diagnosis of influenza from Nasopharyngeal swab specimens and should not be used as a sole basis for treatment. Nasal washings and aspirates are unacceptable for Xpert Xpress SARS-CoV-2/FLU/RSV testing.  Fact Sheet for Patients: BloggerCourse.com  Fact Sheet for Healthcare Providers: SeriousBroker.it  This test is not yet approved or cleared by the [place] FDA and has been authorized for detection and/or diagnosis of SARS-CoV-2 by FDA under an Emergency Use Authorization (EUA). This EUA will remain in effect (meaning this test can be used) for the duration of the COVID-19 declaration under Section 564(b)(1) of the Act, 21 U.S.C. section 360bbb-3(b)(1), unless the authorization is terminated or revoked.     Resp Syncytial Virus by PCR NEGATIVE NEGATIVE Final    Comment: (NOTE) Fact Sheet for Patients: BloggerCourse.com  Fact Sheet for Healthcare Providers: SeriousBroker.it  This test is not yet approved or cleared by the [place] FDA and has been authorized for detection and/or diagnosis of SARS-CoV-2 by FDA under an Emergency Use Authorization (EUA). This EUA will remain in effect (meaning this test can be used) for the duration of the COVID-19 declaration under Section 564(b)(1) of the Act, 21 U.S.C. section 360bbb-3(b)(1), unless the authorization is terminated or revoked.  Performed at [facility], [address] [place], [place] [address]   Respiratory (~20 pathogens) panel by PCR     Status: None   Collection Time: 09/20/23  8:21 AM   Specimen: Nasopharyngeal Swab; Respiratory  Result Value Ref Range Status   Adenovirus NOT DETECTED NOT DETECTED Final    Coronavirus 229E NOT DETECTED NOT DETECTED Final    Comment: (NOTE) The Coronavirus on the Respiratory Panel, DOES NOT test for the novel  Coronavirus (2019 nCoV)    Coronavirus HKU1 NOT DETECTED NOT DETECTED Final   Coronavirus NL63 NOT DETECTED NOT DETECTED Final   Coronavirus OC43 NOT DETECTED NOT DETECTED Final   Metapneumovirus NOT DETECTED NOT DETECTED Final   Rhinovirus / Enterovirus NOT DETECTED NOT DETECTED Final   Influenza A NOT DETECTED NOT DETECTED Final   Influenza B NOT DETECTED NOT DETECTED Final   Parainfluenza Virus 1 NOT DETECTED NOT DETECTED Final   Parainfluenza Virus 2 NOT DETECTED NOT DETECTED Final   Parainfluenza Virus 3 NOT DETECTED NOT DETECTED Final   Parainfluenza Virus 4 NOT DETECTED NOT DETECTED Final   Respiratory Syncytial Virus NOT DETECTED NOT DETECTED Final   Bordetella pertussis NOT DETECTED NOT DETECTED Final   Bordetella Parapertussis NOT DETECTED NOT DETECTED Final   Chlamydophila pneumoniae NOT DETECTED NOT DETECTED Final   Mycoplasma pneumoniae NOT DETECTED NOT DETECTED Final    Comment: Performed at [facility], [address] [place], [place] [address]  C Difficile  Quick Screen w PCR reflex     Status: None   Collection Time: 09/20/23  5:21 PM   Specimen: STOOL  Result Value Ref Range Status   C Diff antigen NEGATIVE NEGATIVE Final   C Diff toxin NEGATIVE NEGATIVE Final   C Diff interpretation No C. difficile detected.  Final    Comment: Performed at [facility], [address] [place], [place] [address]  Gastrointestinal Panel by PCR , Stool     Status: None   Collection Time: 09/20/23  5:22 PM   Specimen: Stool  Result Value Ref Range Status   Campylobacter species NOT DETECTED NOT DETECTED Final   Plesimonas shigelloides NOT DETECTED NOT DETECTED Final   Salmonella species NOT DETECTED NOT DETECTED Final   Yersinia enterocolitica NOT DETECTED NOT DETECTED Final   Vibrio species NOT DETECTED NOT DETECTED  Final   Vibrio cholerae NOT DETECTED NOT DETECTED Final   Enteroaggregative E coli (EAEC) NOT DETECTED NOT DETECTED Final   Enteropathogenic E coli (EPEC) NOT DETECTED NOT DETECTED Final   Enterotoxigenic E coli (ETEC) NOT DETECTED NOT DETECTED Final   Shiga like toxin producing E coli (STEC) NOT DETECTED NOT DETECTED Final   Shigella/Enteroinvasive E coli (EIEC) NOT DETECTED NOT DETECTED Final   Cryptosporidium NOT DETECTED NOT DETECTED Final   Cyclospora cayetanensis NOT DETECTED NOT DETECTED Final   Entamoeba histolytica NOT DETECTED NOT DETECTED Final   Giardia lamblia NOT DETECTED NOT DETECTED Final   Adenovirus F40/41 NOT DETECTED NOT DETECTED Final   Astrovirus NOT DETECTED NOT DETECTED Final   Norovirus GI/GII NOT DETECTED NOT DETECTED Final   Rotavirus A NOT DETECTED NOT DETECTED Final   Sapovirus (I, II, IV, and V) NOT DETECTED NOT DETECTED Final    Comment: Performed at [facility], [address], [place], [place] [address]      Radiology Studies: No results found.    LOS: 4 days    Jacquelin Hawking, MD [facility] 09/22/2023, 12:05 PM   If 7PM-7AM, please contact night-coverage [email or web address]

## 2023-09-22 NOTE — Progress Notes (Incomplete)
    Patient Name: Rebecca Ochoa           DOB: 07/10/1961  MRN: [id number]      Admission Date: 09/18/2023  Attending Provider: Narda Bonds, MD  Primary Diagnosis: Sepsis [facility])   Level of care: Progressive    CROSS COVER NOTE   Date of Service   09/22/2023   Rebecca Ochoa, 62 y.o. female, was admitted on 09/18/2023 for Sepsis [facility]).    HPI/Events of Note   Follow up requested by attending on Head CT w/o contrast to rule out acute hemorrhage.  MRI brain - "small foci of hemorrhage in the left occipital lobe and left parietal lobe with surrounding cerebral edema, concerns for brain metastasis but could not rule out acute hemorrhage."   Head CT showing small area of hemorrhage -- "Small area of hypodensity in the left occipital lobe, with minimal hyperdensity, which likely correlates to the small focus of hemorrhage with associated edema noted on the prior MRI. The smaller focus of edema in the left parietal lobe does not have as clear a correlate, but the hemorrhage may correlate with a small area of hyperdensity. No definite mass. No evidence of acute infarction, mass effect, or midline shift. No hydrocephalus."   Consulted Dr. Amada Jupiter (neurology) given CT results, plan is to transfer pt to [facility]. She will be seen by neuro team once she arrives to [facility].   Bedside Assessment:  Patient is awake, A/O x4, with no associated distress.  No focal neuro deficits. NIHSS 0. Had headache earlier today but denies one now.  Last Eliquis taken was on 11/22 at 0958. Decadron started for edema and headache.    Interventions/ Plan   Neuro assessment Q4 Transfer to [facility] Neuro consult complete        Anthoney Harada, DNP, ACNPC- AG [facility]

## 2023-09-23 ENCOUNTER — Inpatient Hospital Stay (HOSPITAL_COMMUNITY): Payer: No Typology Code available for payment source

## 2023-09-23 DIAGNOSIS — R6510 Systemic inflammatory response syndrome (SIRS) of non-infectious origin without acute organ dysfunction: Secondary | ICD-10-CM

## 2023-09-23 DIAGNOSIS — I619 Nontraumatic intracerebral hemorrhage, unspecified: Secondary | ICD-10-CM

## 2023-09-23 LAB — CBC
HCT: 30.2 % — ABNORMAL LOW (ref 36.0–46.0)
Hemoglobin: 9.1 g/dL — ABNORMAL LOW (ref 12.0–15.0)
MCH: 26.3 pg (ref 26.0–34.0)
MCHC: 30.1 g/dL (ref 30.0–36.0)
MCV: 87.3 fL (ref 80.0–100.0)
Platelets: 315 10*3/uL (ref 150–400)
RBC: 3.46 MIL/uL — ABNORMAL LOW (ref 3.87–5.11)
RDW: 15 % (ref 11.5–15.5)
WBC: 5.5 10*3/uL (ref 4.0–10.5)
nRBC: 0 % (ref 0.0–0.2)

## 2023-09-23 LAB — CULTURE, BLOOD (ROUTINE X 2)
Culture: NO GROWTH
Culture: NO GROWTH
Special Requests: ADEQUATE
Special Requests: ADEQUATE

## 2023-09-23 LAB — T3: T3, Total: 148 ng/dL (ref 71–180)

## 2023-09-23 MED ORDER — CARMEX CLASSIC LIP BALM EX OINT
TOPICAL_OINTMENT | CUTANEOUS | Status: DC | PRN
  Filled 2023-09-23: qty 10

## 2023-09-23 MED ORDER — HEPARIN SODIUM (PORCINE) 5000 UNIT/ML IJ SOLN
5000.0000 [IU] | Freq: Two times a day (BID) | INTRAMUSCULAR | Status: DC
  Administered 2023-09-23 – 2023-09-24 (×2): 5000 [IU] via SUBCUTANEOUS
  Filled 2023-09-23 (×2): qty 1

## 2023-09-23 MED ORDER — IOHEXOL 350 MG/ML SOLN
75.0000 mL | Freq: Once | INTRAVENOUS | Status: AC | PRN
  Administered 2023-09-23: 75 mL via INTRAVENOUS

## 2023-09-23 NOTE — Consult Note (Signed)
NEUROLOGY CONSULT NOTE   Date of service: September 23, 2023 Patient Name: Rebecca Ochoa MRN:  [id number] DOB:  10/24/1961 Chief Complaint: "Headache" Requesting Provider: Narda Bonds, MD  History of Present Illness  Rebecca Ochoa is a 62 y.o. female with squamous cell carcinoma of the right lung with metastasis to liver and bone.  She presented to the emergency department with fever, leukopenia and headache.  She was started empirically on ceftriaxone and azithromycin.  She is improved, currently has no headache.  She also was diagnosed with pulmonary embolism on 11/4 and was started on Eliquis.    Due to her headache in the setting of cancer an MRI with and without contrast was obtained which demonstrates a punctate area of hemorrhage in the left parietal region.  This was discovered yesterday.  A CT was performed after this to confirm this and it does show a punctate area of hemosiderin deposition with larger area of edema.   LKW: Monday Modified rankin score: 0-Completely asymptomatic and back to baseline post- stroke ICH Score:0 NIHSS0 Past History   Past Medical History:  Diagnosis Date   Anemia    Asthma, mild intermittent, well-controlled    GERD (gastroesophageal reflux disease)    Hx of migraines    Hyperlipidemia    Hypertension    Vitamin D deficiency     Past Surgical History:  Procedure Laterality Date   BREAST SURGERY     reduction   BRONCHIAL BIOPSY  08/08/2023   Procedure: BRONCHIAL BIOPSIES;  Surgeon: Leslye Peer, MD;  Location: [facility] ENDOSCOPY;  Service: Pulmonary;;   BRONCHIAL BRUSHINGS  08/08/2023   Procedure: BRONCHIAL BRUSHINGS;  Surgeon: Leslye Peer, MD;  Location: [facility] ENDOSCOPY;  Service: Pulmonary;;   BRONCHIAL NEEDLE ASPIRATION BIOPSY  08/08/2023   Procedure: BRONCHIAL NEEDLE ASPIRATION BIOPSIES;  Surgeon: Leslye Peer, MD;  Location: [facility] ENDOSCOPY;  Service: Pulmonary;;   ENDOBRONCHIAL ULTRASOUND Bilateral 08/08/2023   Procedure: ENDOBRONCHIAL  ULTRASOUND;  Surgeon: Leslye Peer, MD;  Location: [facility] ENDOSCOPY;  Service: Pulmonary;  Laterality: Bilateral;   HEMOSTASIS CONTROL  08/08/2023   Procedure: HEMOSTASIS CONTROL;  Surgeon: Leslye Peer, MD;  Location: [facility] ENDOSCOPY;  Service: Pulmonary;;   IR IMAGING GUIDED PORT INSERTION  09/01/2023   REDUCTION MAMMAPLASTY     TUBAL LIGATION      Family History: Family History  Problem Relation Age of Onset   Heart disease Father    Hypertension Father    Diabetes Father    Kidney disease Father     Social History  reports that she has been smoking e-cigarettes. She has never used smokeless tobacco. She reports current alcohol use of about 3.0 standard drinks of alcohol per week. She reports that she does not use drugs.  Allergies  Allergen Reactions   Biaxin [Clarithromycin] Nausea Only    Medications   Current Facility-Administered Medications:    (feeding supplement) PROSource Plus liquid 30 mL, 30 mL, Oral, TID PC & HS, Narda Bonds, MD, 30 mL at 09/22/23 1726   acetaminophen (TYLENOL) tablet 650 mg, 650 mg, Oral, Q6H PRN, 650 mg at 09/22/23 2100 **OR** acetaminophen (TYLENOL) suppository 650 mg, 650 mg, Rectal, Q6H PRN, Nolberto Hanlon, MD   albuterol (PROVENTIL) (2.5 MG/3ML) 0.083% nebulizer solution 3 mL, 3 mL, Nebulization, Q6H PRN, Nolberto Hanlon, MD   ALPRAZolam Prudy Feeler) tablet 0.5 mg, 0.5 mg, Oral, BID PRN, Nolberto Hanlon, MD, 0.5 mg at 09/22/23 2100   alum & mag hydroxide-simeth (MAALOX/MYLANTA) 200-200-20 MG/5ML suspension 30  mL, 30 mL, Oral, Q6H PRN, Narda Bonds, MD, 30 mL at 09/20/23 1110   cefTRIAXone (ROCEPHIN) 2 g in sodium chloride 0.9 % 100 mL IVPB, 2 g, Intravenous, Q24H, Nolberto Hanlon, MD, Last Rate: 200 mL/hr at 09/22/23 2106, 2 g at 09/22/23 2106   Chlorhexidine Gluconate Cloth 2 % PADS 6 each, 6 each, Topical, Daily, Nolberto Hanlon, MD, 6 each at 09/22/23 1416   dexamethasone (DECADRON) tablet 4 mg, 4 mg, Oral, Q8H, Narda Bonds, MD, 4 mg at 09/22/23 2100    folic acid (FOLVITE) tablet 1 mg, 1 mg, Oral, Daily, Nolberto Hanlon, MD, 1 mg at 09/22/23 0957   HYDROcodone-acetaminophen (NORCO/VICODIN) 5-325 MG per tablet 1 tablet, 1 tablet, Oral, Q6H PRN, Nolberto Hanlon, MD, 1 tablet at 09/21/23 2203   methocarbamol (ROBAXIN) tablet 750 mg, 750 mg, Oral, Q6H PRN, Narda Bonds, MD, 750 mg at 09/22/23 0243   ondansetron (ZOFRAN) tablet 4 mg, 4 mg, Oral, Q6H PRN, Nolberto Hanlon, MD, 4 mg at 09/22/23 4098   Oral care mouth rinse, 15 mL, Mouth Rinse, PRN, Nolberto Hanlon, MD   pantoprazole (PROTONIX) EC tablet 40 mg, 40 mg, Oral, BID AC, Narda Bonds, MD, 40 mg at 09/22/23 1725   polyethylene glycol (MIRALAX / GLYCOLAX) packet 17 g, 17 g, Oral, Daily PRN, Nolberto Hanlon, MD   sodium chloride flush (NS) 0.9 % injection 10-40 mL, 10-40 mL, Intracatheter, Q12H, Nolberto Hanlon, MD, 10 mL at 09/22/23 2117   sodium chloride flush (NS) 0.9 % injection 10-40 mL, 10-40 mL, Intracatheter, PRN, Nolberto Hanlon, MD   sodium chloride flush (NS) 0.9 % injection 3 mL, 3 mL, Intravenous, Q12H, Nolberto Hanlon, MD, 3 mL at 09/20/23 2157   sucralfate (CARAFATE) 1 GM/10ML suspension 1 g, 1 g, Oral, TID AC & HS, Narda Bonds, MD, 1 g at 09/22/23 2117  Vitals   Vitals:   09/22/23 1941 09/22/23 2255 09/22/23 2349 09/23/23 0205  BP: 131/75  106/71 111/70  Pulse: (!) 129  (!) 110 (!) 114  Resp: 20  20   Temp: 99.8 F (37.7 C) 98.6 F (37 C) 98.2 F (36.8 C) 97.7 F (36.5 C)  TempSrc: Oral  Oral Oral  SpO2: 93%  94% 94%  Weight:      Height:        Body mass index is 26.96 kg/m.  Physical Exam   Constitutional: Appears well-developed and well-nourished.   Neurologic Examination    Neuro: Mental Status: Patient is awake, alert, oriented to person, place, month, year, and situation. Patient is able to give a clear and coherent history. No signs of aphasia or neglect Cranial Nerves: II: Visual Fields are full. Pupils are equal, round, and reactive to light.   III,IV, VI: EOMI  without ptosis or diploplia.  V: Facial sensation is symmetric to temperature VII: Facial movement is symmetric.  VIII: hearing is intact to voice X: Uvula elevates symmetrically XII: tongue is midline without atrophy or fasciculations.  Motor: Tone is normal. Bulk is normal. 5/5 strength was present in all four extremities.  Sensory: Sensation is symmetric to light touch and temperature in the arms and legs. Cerebellar: FNF and HKS are intact bilaterally     Labs/Imaging/Neurodiagnostic studies   CBC:  Recent Labs  Lab Oct 12, 2023 1326 09/19/23 0340 09/20/23 0307 09/21/23 0444 09/22/23 0521  WBC 7.0   < > 2.5* 3.0* 3.3*  NEUTROABS 6.2  --  1.9  --   --   HGB 8.8*   < >  7.9* 7.7* 7.8*  HCT 29.1*   < > 25.5* 24.8* 25.8*  MCV 89.0   < > 88.9 88.6 90.5  PLT 282   < > 220 205 217   < > = values in this interval not displayed.   Basic Metabolic Panel:  Lab Results  Component Value Date   NA 137 09/21/2023   K 3.4 (L) 09/21/2023   CO2 23 09/21/2023   GLUCOSE 95 09/21/2023   BUN 6 (L) 09/21/2023   CREATININE 0.59 09/21/2023   CALCIUM 8.2 (L) 09/21/2023   GFRNONAA >60 09/21/2023   GFRAA 95 10/12/2020   Lipid Panel:  Lab Results  Component Value Date   LDLCALC 90 10/12/2022   HgbA1c:  Lab Results  Component Value Date   HGBA1C 5.8 (H) 10/12/2022   Urine Drug Screen: No results found for: "LABOPIA", "COCAINSCRNUR", "LABBENZ", "AMPHETMU", "THCU", "LABBARB"  Alcohol Level No results found for: "ETH" INR  Lab Results  Component Value Date   INR 2.1 (H) 09/19/2023   APTT  Lab Results  Component Value Date   APTT 40 (H) 09/19/2023    MRI Brain(Personally reviewed): Punctate area of hemosiderin deposition with surrounding edema.  ASSESSMENT   Myeisha Sangha is a 62 y.o. female with history of metastatic squamous cell cancer with punctate area of hemorrhage with surrounding edema.  There is no enhancement to confirm that this is a hemorrhagic metastasis, but I do  think that is possible.  Other possibilities would include a tiny ischemic infarct that happened in the setting of her embolization event that subsequently had hemorrhagic conversion after being started on Eliquis with the edema being a reaction to the hemorrhage.  Hemorrhagic pres, though it could be a consideration, I think is less likely given there is only a single lesion.   At this point, prior to any consideration of restarting anticoagulation, I would favor repeating her head CT to ensure stability.  Her blood pressures have been less than 150 for most of her hospitalization.  RECOMMENDATIONS  Repeat head CT this morning She will likely need repeat contrasted MRI at a later date to assess for possible metastasis. ______________________________________________________________________    Stormy Card, MD [facility]

## 2023-09-23 NOTE — Progress Notes (Signed)
Patient arrived to unit via [facility]. Pt Aox4, vitals WNL and  focused assessment completed. Patient oriented to room and unit.   Patient belongings: kept at bedside with partner.

## 2023-09-23 NOTE — Progress Notes (Signed)
Patient is being transferred to [facility] for continuity of care. Report already given and [facility] contacted for transport.

## 2023-09-23 NOTE — Progress Notes (Signed)
Progress Note   Patient: Rebecca Ochoa WUJ:[id number] DOB: 12-16-1960 DOA: 09/18/2023     6 DOS: the patient was seen and examined on 09/24/2023   Brief hospital course: 62 y.o. 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 No source identified. Patient started empirically on Ceftriaxone and azithromycin and is improving. During hospitalization, patient developed persistent headache. 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 She is scheduled for repeat head CT on 11/23 with likely need for repeat contrasted MRI for further evaluation of possible metastasis.  CTA neck with tonsillar mass, will need ENT evaluation.  Assessment and Plan:  SIRS Patient presented with fever and leukopenia - no source identified COVID-19, influenza, RSV negative Blood cultures obtained and are negativ Patient started empirically on Ceftriaxone and azithromycin Completed Azithromycin She is currently on day 6 of ceftriaxone, will stop after today's dose   Anemia of chronic disease Hgb down to 6.8 Transfused 1 unit PRBC with slow improvement Now back to baseline   History of pulmonary embolism PE diagnosed on 11/4 Patient has been on Eliquis for management Repeat CTA chest on 11/12 showed interval partial clearing. Hold Eliquis in setting of possible acute brain hemorrhage Dr. 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 Arbutus Ped as an outpatient She has completed radiation and planned to start Tagrisso but has had delays MRI shows concern for brain mets and recommends f/u scan in 3 months CTA shows tonsillar mass - Dr. Shirline Frees contacted via Inbox message for guidance   Leukopenia Driven by lymphocytopenia. Normal ANC.   Severe  malnutrition Nutrition Problem: Severe Malnutrition Etiology: chronic illness, cancer and cancer related treatments Signs/Symptoms: moderate fat depletion, mild muscle depletion, energy intake < or equal to 75% for > or equal to 1 month, percent weight loss Interventions: Prostat, Education  Anxiety Continue prn alprazolam        Consultants:  Neurology TOC team Nutrition   Procedures:  None   Antimicrobials: Ceftriaxone  11/18-23 Azithromycin 11/18-22  30 Day Unplanned Readmission Risk Score    Flowsheet Row ED to Hosp-Admission (Current) from 09/18/2023 in [facility]  30 Day Unplanned Readmission Risk Score (%) 34.29 Filed at 09/23/2023 0400       This score is the patient's risk of an unplanned readmission within 30 days of being discharged (0 -100%). The score is based on dignosis, age, lab data, medications, orders, and past utilization.   Low:  0-14.9   Medium: 15-21.9   High: 22-29.9   Extreme: 30 and above           Subjective: No specific complaints today.  Anxious about the road ahead.   Objective: Vitals:   09/23/23 2307 09/24/23 0313  BP: 120/71 129/77  Pulse: (!) 102 (!) 101  Resp: 18 18  Temp: 97.6 F (36.4 C) 97.9 F (36.6 C)  SpO2: 92% 91%   No intake or output data in the 24 hours ending 09/24/23 0725  Filed Weights   09/18/23 1519 09/18/23 2029 09/19/23 0857  Weight: 73.5 kg 73.6 kg 73.5 kg    Exam:  General:  Appears calm and comfortable and is in NAD Eyes:  EOMI, normal lids, iris ENT:  grossly normal hearing, lips & tongue, mmm Neck:  no  LAD, masses or thyromegaly Cardiovascular:  RRR, no m/r/g. No LE edema.  Respiratory:   CTA bilaterally with no wheezes/rales/rhonchi.  Normal respiratory effort. Abdomen:  soft, NT, ND Skin:  no rash or induration seen on limited exam Musculoskeletal:  grossly normal tone BUE/BLE, good ROM, no bony abnormality Psychiatric:  blunted mood and affect, speech fluent and  appropriate, AOx3 Neurologic:  CN 2-12 grossly intact, moves all extremities in coordinated fashion  Data Reviewed: I have reviewed the patient's lab results since admission.  Pertinent labs for today include:   WBC 5.5 Hgb 9.1, up from 7.8     Family Communication: Partner was present throughout evaluation  Disposition: Status is: Inpatient Remains inpatient appropriate because: ongoing management     Time spent: 50 minutes  Unresulted Labs (From admission, onward)     Start     Ordered   Unscheduled  Occult blood card to lab, stool  As needed,   R      09/20/23 1130             Author: Jonah Blue, MD 09/24/2023 7:25 AM  For on call review [email or web address].

## 2023-09-23 NOTE — Progress Notes (Signed)
STROKE TEAM PROGRESS NOTE   SUBJECTIVE (INTERVAL HISTORY) Her heart went is at the bedside.  Overall her condition is stable.  CTA head and neck done no LVO.  CT repeat showed stable, left occipital and parietal findings and underlying microhemorrhage at the both sides is not appreciated by CT.   OBJECTIVE Temp:  [97.5 F (36.4 C)-99.8 F (37.7 C)] 98.2 F (36.8 C) (11/23 1621) Pulse Rate:  [109-129] 109 (11/23 1621) Cardiac Rhythm: Sinus tachycardia (11/23 0721) Resp:  [20] 20 (11/22 2349) BP: (106-131)/(68-77) 126/77 (11/23 1621) SpO2:  [91 %-94 %] 92 % (11/23 1621)  No results for input(s): "GLUCAP" in the last 168 hours. Recent Labs  Lab 09/18/23 1326 09/19/23 0340 09/20/23 0307 09/21/23 0444 09/21/23 0700  NA 131* 133* 135 137  --   K 3.9 3.2* 3.4* 3.4*  --   CL 96* 102 105 105  --   CO2 24 24 23 23   --   GLUCOSE 108* 136* 108* 95  --   BUN 10 9 7* 6*  --   CREATININE 0.71 0.65 0.65 0.59  --   CALCIUM 8.4* 8.0* 8.0* 8.2*  --   MG  --   --  2.0  --  2.0   Recent Labs  Lab 09/18/23 1326 09/21/23 0444  AST 46*  --   ALT 20  --   ALKPHOS 124  --   BILITOT 1.4* 0.7  PROT 6.7  --   ALBUMIN 2.7*  --    Recent Labs  Lab 09/18/23 1326 09/19/23 0340 09/19/23 1900 09/20/23 0307 09/21/23 0444 09/22/23 0521 09/23/23 0640  WBC 7.0 3.6*  --  2.5* 3.0* 3.3* 5.5  NEUTROABS 6.2  --   --  1.9  --   --   --   HGB 8.8* 6.8* 8.3* 7.9* 7.7* 7.8* 9.1*  HCT 29.1* 22.4* 26.8* 25.5* 24.8* 25.8* 30.2*  MCV 89.0 90.3  --  88.9 88.6 90.5 87.3  PLT 282 224  --  220 205 217 315   No results for input(s): "CKTOTAL", "CKMB", "CKMBINDEX", "TROPONINI" in the last 168 hours. No results for input(s): "LABPROT", "INR" in the last 72 hours. No results for input(s): "COLORURINE", "LABSPEC", "PHURINE", "GLUCOSEU", "HGBUR", "BILIRUBINUR", "KETONESUR", "PROTEINUR", "UROBILINOGEN", "NITRITE", "LEUKOCYTESUR" in the last 72 hours.  Invalid input(s): "APPERANCEUR"     Component Value  Date/Time   CHOL 188 10/12/2022 1442   TRIG 149 10/12/2022 1442   HDL 73 10/12/2022 1442   CHOLHDL 2.6 10/12/2022 1442   VLDL 21 05/31/2016 1006   LDLCALC 90 10/12/2022 1442   Lab Results  Component Value Date   HGBA1C 5.8 (H) 10/12/2022   No results found for: "LABOPIA", "COCAINSCRNUR", "LABBENZ", "AMPHETMU", "THCU", "LABBARB"  No results for input(s): "ETH" in the last 168 hours.  I have personally reviewed the radiological images below and agree with the radiology interpretations.  CT ANGIO HEAD NECK W WO CM  Result Date: 09/23/2023 CLINICAL DATA:  62 year old female with headache. Small areas of left occipital lobe and left parietal lobe hemorrhage and edema on the MRI yesterday. History of lung cancer. EXAM: CT ANGIOGRAPHY HEAD AND NECK WITH AND WITHOUT CONTRAST TECHNIQUE: Multidetector CT imaging of the head and neck was performed using the standard protocol during bolus administration of intravenous contrast. Multiplanar CT image reconstructions and MIPs were obtained to evaluate the vascular anatomy. Carotid stenosis measurements (when applicable) are obtained utilizing NASCET criteria, using the distal internal carotid diameter as the denominator. RADIATION DOSE REDUCTION: This  exam was performed according to the departmental dose-optimization program which includes automated exposure control, adjustment of the mA and/or kV according to patient size and/or use of iterative reconstruction technique. CONTRAST:  75mL OMNIPAQUE IOHEXOL 350 MG/ML SOLN COMPARISON:  Brain MRI and noncontrast head CT yesterday. FINDINGS: CT HEAD Brain: There remains no intracranial mass effect or ventriculomegaly. Mild left occipital pole edema is stable (series 4, image 17), but the left parietal findings and the underlying microhemorrhage at both areas is not apparent by CT. No new intracranial abnormality. Calvarium and skull base: No acute or suspicious osseous lesion. Paranasal sinuses: Visualized paranasal  sinuses and mastoids are stable and well aerated. Orbits: Leftward gaze, otherwise negative orbits and scalp. CTA NECK Skeleton: Age-appropriate cervical spine degeneration. No acute or suspicious osseous lesion. Upper chest: Upper lung mass is greater on the right. CT CTA chest 09/12/2023. Other neck: Heterogeneously enhancing left tonsillar pillar mass, 16 x 21 x 23 mm (AP by transverse by CC) at the level of the uvula. Left level 2 lymph nodes remain within normal limits. No parapharyngeal or retropharyngeal space inflammation. No other neck mass or lymphadenopathy. Aortic arch: Calcified aortic atherosclerosis.  3 vessel arch. Right carotid system: Mild for age atherosclerosis and no stenosis. Left carotid system: Left CCA origin plaque without stenosis. Mild soft plaque in the left CCA and at the left ICA origin without significant stenosis. Vertebral arteries: Mild proximal right subclavian artery plaque without stenosis. Right vertebral artery origin calcified plaque with up to moderate stenosis series 13, image 71. Right vertebral remains patent to the skull base with no additional plaque or stenosis and is non dominant. Proximal left subclavian artery plaque without stenosis. Calcified plaque at the left vertebral artery origin but only mild stenosis on series 13, image 67. Dominant left vertebral. Mildly tortuous. No additional stenosis to the skull base. CTA HEAD Posterior circulation: Distal vertebral arteries and vertebrobasilar junction are patent without stenosis. Left V4 is dominant. Normal PICA origins. Patent basilar artery with calcified plaque on series 12, image 122. But no basilar stenosis. Patent SCA and left PCA origins. Fetal type right PCA origin. Bilateral PCA branches are within normal limits. Anterior circulation: Both ICA siphons are patent. Left siphon anterior genu calcified plaque with only mild stenosis. Right siphon cavernous segment calcified plaque with only mild stenosis. Normal  right posterior communicating artery origin. Patent carotid termini. Normal MCA and ACA origins. Dominant right A1. Normal anterior communicating artery. Bilateral ACA branches are within normal limits. Bilateral MCA branches are within normal limits. Venous sinuses: Patent. Anatomic variants: Dominant left vertebral artery. Fetal type right PCA origin. Dominant right A1. Other findings: No abnormal brain enhancement is identified. Review of the MIP images confirms the above findings IMPRESSION: 1. Upper lung masses as detailed on 09/12/2023 chest CTA. Superimposed left palatine tonsillar enhancing mass, up to 2.3 cm. No malignant cervical lymphadenopathy, but the constellation raises the possibility of metastatic head and neck cancer or synchronous lung and tonsillar primaries. 2. Negative for large vessel occlusion. 3. Up to moderate atherosclerotic stenosis of the non dominant Right Vertebral Artery. Dominant Left Vertebral Artery with no stenosis. 4. No other hemodynamically significant arterial stenosis. 5. Stable CT appearance of the brain since yesterday. No new intracranial abnormality. 6.  Aortic Atherosclerosis (ICD10-I70.0). Electronically Signed   By: Odessa Fleming M.D.   On: 09/23/2023 10:59   CT HEAD WO CONTRAST ( )  Result Date: 09/22/2023 CLINICAL DATA:  Seven and severe headache, metastatic disease suspected EXAM: CT  HEAD WITHOUT CONTRAST TECHNIQUE: Contiguous axial images were obtained from the base of the skull through the vertex without intravenous contrast. RADIATION DOSE REDUCTION: This exam was performed according to the departmental dose-optimization program which includes automated exposure control, adjustment of the mA and/or kV according to patient size and/or use of iterative reconstruction technique. COMPARISON:  No prior CT head available 09/22/2023 MRI head FINDINGS: Brain: Small area of hypodensity in the left occipital lobe, with minimal hyperdensity, which likely correlates to the  small focus of hemorrhage with associated edema noted on the prior MRI. The smaller focus of edema in the left parietal lobe does not have as clear a correlate, but the hemorrhage may correlate with a small area of hyperdensity (series 6, image 33). No definite mass. No evidence of acute infarction, mass effect, or midline shift. No hydrocephalus. Vascular: No hyperdense vessel. Skull: Negative for fracture or focal lesion. Sinuses/Orbits: No acute finding. Other: The mastoid air cells are well aerated. IMPRESSION: 1. Small area of hypodensity in the left occipital lobe, with minimal hyperdensity, correlates with the small focus of hemorrhage and associated edema noted on the prior MRI. 2. The smaller focus of edema in the left parietal lobe does not have as clear a correlate on CT, but the hemorrhage may correlate with a small area of hyperdensity. No definite mass. 3. Process. No additional acute intracranial Electronically Signed   By: Wiliam Ke M.D.   On: 09/22/2023 21:52   MR BRAIN W WO CONTRAST  Result Date: 09/22/2023 CLINICAL DATA:  Headache EXAM: MRI HEAD WITHOUT AND WITH CONTRAST TECHNIQUE: Multiplanar, multiecho pulse sequences of the brain and surrounding structures were obtained without and with intravenous contrast. CONTRAST:  7mL GADAVIST GADOBUTROL 1 MMOL/ML IV SOLN COMPARISON:  Brain MR 08/05/2023 FINDINGS: Brain: Negative for an acute infarct. No hemorrhage. No hydrocephalus. No extra-axial fluid collection. There is a background of mild chronic microvascular ischemic change. Compared to prior exam there is a new focus of susceptibility artifact in the left occipital lobe with surrounding cerebral edema. There is no contrast enhancement associated with this site of hemorrhage, but an underlying metastatic lesion is not excluded. Additional similar focus of susceptibility artifact in the left parietal lobe (series 10, image 53). Recommend follow up contrast-enhanced brain MRI in 3 months to  assess for interval change. Vascular: Normal flow voids. Skull and upper cervical spine: Normal marrow signal. Sinuses/Orbits: Middle ear or mastoid effusion. Paranasal sinuses are clear. Orbits are unremarkable. Other: None. IMPRESSION: Compared to prior exam there are new small foci of hemorrhage in the left occipital lobe and left parietal lobe with surrounding cerebral edema. There is no contrast enhancement associated with sites. These are nonspecific, but metastatic disease is not excluded. Recommend follow up contrast-enhanced brain MRI in 3 months to assess for interval change. Electronically Signed   By: Lorenza Cambridge M.D.   On: 09/22/2023 17:19   US Abdomen Limited RUQ (LIVER/GB)  Result Date: 09/18/2023 CLINICAL DATA:  Elevated liver function tests. EXAM: ULTRASOUND ABDOMEN LIMITED RIGHT UPPER QUADRANT COMPARISON:  None Available. FINDINGS: Gallbladder: The gallbladder is contracted. No gallstones or wall thickening visualized (1.9 mm). No sonographic Murphy sign noted by sonographer. Common bile duct: Diameter: 3.1 mm Liver: Heterogeneous echogenic masslike lesions are seen within the left lobe of the liver. The largest measures approximately 5.3 cm x 4.6 cm x 3.4 cm. Diffusely increased echogenicity of the liver parenchyma is noted. Portal vein is patent on color Doppler imaging with normal direction of blood  flow towards the liver. Other: None. IMPRESSION: 1. Hepatic steatosis with multiple large heterogeneous liver lesions, concerning for the presence of hepatic metastasis. Electronically Signed   By: Aram Candela M.D.   On: 09/18/2023 21:53   DG Chest 2 View  Result Date: 09/18/2023 CLINICAL DATA:  Shortness of breath. Nonproductive cough. Suspected Sepsis EXAM: CHEST - 2 VIEW COMPARISON:  09/04/2023. FINDINGS: Redemonstration of multiple lobulated opacities throughout bilateral lungs, right more than left, compatible with patient's known history of pulmonary metastasis. No acute  consolidation or lung collapse seen. Bilateral costophrenic angles are clear. Note is made of elevated right hemidiaphragm. Normal cardio-mediastinal silhouette. No acute osseous abnormalities. The soft tissues are within normal limits. Left-sided CT Port-A-Cath is again seen with its tip overlying the cavoatrial junction region. IMPRESSION: *No active cardiopulmonary disease. *Redemonstration of extensive lung opacities, compatible with metastases. Electronically Signed   By: Jules Schick M.D.   On: 09/18/2023 16:00   CT Angio Chest PE W/Cm &/Or Wo Cm  Result Date: 09/12/2023 CLINICAL DATA:  Bilateral pulmonary emboli diagnosis 09/04/2023. Bilateral pulmonary metastasis. RIGHT upper lobe pulmonary masses. * Tracking Code: BO * EXAM: CT ANGIOGRAPHY CHEST WITH CONTRAST TECHNIQUE: Multidetector CT imaging of the chest was performed using the standard protocol during bolus administration of intravenous contrast. Multiplanar CT image reconstructions and MIPs were obtained to evaluate the vascular anatomy. RADIATION DOSE REDUCTION: This exam was performed according to the departmental dose-optimization program which includes automated exposure control, adjustment of the mA and/or kV according to patient size and/or use of iterative reconstruction technique. CONTRAST:  75mL OMNIPAQUE IOHEXOL 350 MG/ML SOLN COMPARISON:  CT 09/04/2023 FINDINGS: Cardiovascular: Interval partial clearing of the clot burden from the RIGHT lower lobe pulmonary artery thromboemboli. No significant clearing of the LEFT lower lobe pulmonary emboli. Interval clearing of the thrombus in the lingular pulmonary artery. No evidence of new pulmonary emboli. No RIGHT ventricular strain. Mediastinum/Nodes: RIGHT suprahilar mass encroaches upon the mediastinum. No supraclavicular adenopathy. No pericardial fluid. Lungs/Pleura: Numeral bilateral pulmonary metastasis not changed in short a day interval. Large RIGHT upper lobe pulmonary mass measuring  6. 5.5 cm is unchanged. RIGHT suprahilar mass measuring 5.4 cm is unchanged. Limited view of the liver, kidneys, pancreas are unremarkable. Normal adrenal glands. Upper Abdomen: Limited view of the liver, kidneys, pancreas are unremarkable. Normal adrenal glands. Musculoskeletal: No aggressive osseous lesion. Review of the MIP images confirms the above findings. IMPRESSION: 1. Interval partial clearing of the acute pulmonary thromboemboli diagnosed on 09/04/2023. 2. No evidence of new pulmonary emboli. No RIGHT ventricular strain. 3. RIGHT upper lobe and RIGHT suprahilar masses are unchanged. 4. Numerous bilateral pulmonary metastasis are unchanged. Electronically Signed   By: Genevive Bi M.D.   On: 09/12/2023 15:09   ECHOCARDIOGRAM COMPLETE  Result Date: 09/05/2023    ECHOCARDIOGRAM REPORT   Patient Name:   DAILEY EADE Date of Exam: 09/05/2023 Medical Rec #:  [id number]    Height:       65.0 in Accession #:    [id number]   Weight:       165.1 lb Date of Birth:  Mar 26, 1961     BSA:          1.823 m Patient Age:    62 years     BP:           142/73 mmHg Patient Gender: F            HR:           113 bpm.  Exam Location:  Inpatient Procedure: 2D Echo, Cardiac Doppler and Color Doppler Indications:    pulmonary embolus  History:        Patient has no prior history of Echocardiogram examinations.                 COPD, Arrythmias:Tachycardia; Risk Factors:Hypertension and                 Dyslipidemia.  Sonographer:    Karma Ganja Referring Phys: [id number] PROSPER M AMPONSAH  Sonographer Comments: Suboptimal parasternal window. Image acquisition challenging due to COPD. IMPRESSIONS  1. Left ventricular ejection fraction, by estimation, is 60 to 65%. The left ventricle has normal function. The left ventricle has no regional wall motion abnormalities. Indeterminate diastolic filling due to E-A fusion.  2. Right ventricular systolic function is normal. The right ventricular size is normal. Tricuspid regurgitation signal  is inadequate for assessing PA pressure.  3. The mitral valve was not well visualized. No evidence of mitral valve regurgitation.  4. The aortic valve was not well visualized. Aortic valve regurgitation is not visualized. No aortic stenosis is present.  5. The inferior vena cava is normal in size with greater than 50% respiratory variability, suggesting right atrial pressure of 3 mmHg. FINDINGS  Left Ventricle: Left ventricular ejection fraction, by estimation, is 60 to 65%. The left ventricle has normal function. The left ventricle has no regional wall motion abnormalities. Definity contrast agent was given IV to delineate the left ventricular  endocardial borders. The left ventricular internal cavity size was normal in size. There is no left ventricular hypertrophy. Indeterminate diastolic filling due to E-A fusion. Right Ventricle: The right ventricular size is normal. No increase in right ventricular wall thickness. Right ventricular systolic function is normal. Tricuspid regurgitation signal is inadequate for assessing PA pressure. Left Atrium: Left atrial size was not well visualized. Right Atrium: Right atrial size was not well visualized. Pericardium: There is no evidence of pericardial effusion. Presence of epicardial fat layer. Mitral Valve: The mitral valve was not well visualized. No evidence of mitral valve regurgitation. Tricuspid Valve: The tricuspid valve is grossly normal. Tricuspid valve regurgitation is not demonstrated. No evidence of tricuspid stenosis. Aortic Valve: The aortic valve was not well visualized. Aortic valve regurgitation is not visualized. No aortic stenosis is present. Aortic valve mean gradient measures 4.0 mmHg. Aortic valve peak gradient measures 8.3 mmHg. Aortic valve area, by VTI measures 1.77 cm. Pulmonic Valve: The pulmonic valve was not well visualized. Pulmonic valve regurgitation is not visualized. Aorta: The aortic root is normal in size and structure. Venous: The  inferior vena cava is normal in size with greater than 50% respiratory variability, suggesting right atrial pressure of 3 mmHg. IAS/Shunts: The interatrial septum was not well visualized.  LEFT VENTRICLE PLAX 2D LVIDd:         4.50 cm     Diastology LVIDs:         3.70 cm     LV e' medial:    6.42 cm/s LV PW:         0.90 cm     LV E/e' medial:  9.9 LV IVS:        1.00 cm     LV e' lateral:   7.51 cm/s LVOT diam:     2.00 cm     LV E/e' lateral: 8.4 LV SV:         37 LV SV Index:   20 LVOT Area:  3.14 cm  LV Volumes (MOD) LV vol d, MOD A2C: 37.7 ml LV vol d, MOD A4C: 64.4 ml LV vol s, MOD A2C: 8.0 ml LV vol s, MOD A4C: 29.2 ml LV SV MOD A2C:     29.7 ml LV SV MOD A4C:     64.4 ml LV SV MOD BP:      33.5 ml RIGHT VENTRICLE            IVC RV Basal diam:  2.70 cm    IVC diam: 1.50 cm RV S prime:     9.03 cm/s LEFT ATRIUM           Index        RIGHT ATRIUM           Index LA diam:      3.10 cm 1.70 cm/m   RA Area:     12.10 cm LA Vol (A2C): 24.8 ml 13.60 ml/m  RA Volume:   26.50 ml  14.53 ml/m LA Vol (A4C): 30.8 ml 16.89 ml/m  AORTIC VALVE AV Area (Vmax):    2.04 cm AV Area (Vmean):   1.83 cm AV Area (VTI):     1.77 cm AV Vmax:           144.00 cm/s AV Vmean:          95.000 cm/s AV VTI:            0.210 m AV Peak Grad:      8.3 mmHg AV Mean Grad:      4.0 mmHg LVOT Vmax:         93.40 cm/s LVOT Vmean:        55.300 cm/s LVOT VTI:          0.118 m LVOT/AV VTI ratio: 0.56  AORTA Ao Root diam: 3.10 cm MITRAL VALVE MV Area (PHT): 5.88 cm    SHUNTS MV Decel Time: 129 msec    Systemic VTI:  0.12 m MV E velocity: 63.40 cm/s  Systemic Diam: 2.00 cm MV A velocity: 86.10 cm/s MV E/A ratio:  0.74 Lennie Odor MD Electronically signed by Lennie Odor MD Signature Date/Time: 09/05/2023/1:37:27 PM    Final    VAS [place] LOWER EXTREMITY VENOUS (DVT)  Result Date: 09/05/2023  Lower Venous DVT Study Patient Name:  MIRIELLE EATHERLY  Date of Exam:   09/05/2023 Medical Rec #: [id number]     Accession #:    [id number] Date of  Birth: [birth year]      Patient Gender: F Patient Age:   62 years Exam Location:  [facility] Procedure:      VAS [place] LOWER EXTREMITY VENOUS (DVT) Referring Phys: PRANAV PATEL --------------------------------------------------------------------------------  Indications: Edema, and pulmonary embolism.  Risk Factors: Cancer. Anticoagulation: Heparin. Comparison Study: No prior studies. Performing Technologist: Chanda Busing RVT  Examination Guidelines: A complete evaluation includes B-mode imaging, spectral Doppler, color Doppler, and power Doppler as needed of all accessible portions of each vessel. Bilateral testing is considered an integral part of a complete examination. Limited examinations for reoccurring indications may be performed as noted. The reflux portion of the exam is performed with the patient in reverse Trendelenburg.  +---------+---------------+---------+-----------+----------+--------------+ RIGHT    CompressibilityPhasicitySpontaneityPropertiesThrombus Aging +---------+---------------+---------+-----------+----------+--------------+ CFV      Full           Yes      Yes                                 +---------+---------------+---------+-----------+----------+--------------+  SFJ      Full                                                        +---------+---------------+---------+-----------+----------+--------------+ FV Prox  Full                                                        +---------+---------------+---------+-----------+----------+--------------+ FV Mid   Full                                                        +---------+---------------+---------+-----------+----------+--------------+ FV DistalFull                                                        +---------+---------------+---------+-----------+----------+--------------+ PFV      Full                                                         +---------+---------------+---------+-----------+----------+--------------+ POP      Full           Yes      Yes                                 +---------+---------------+---------+-----------+----------+--------------+ PTV      Full                                                        +---------+---------------+---------+-----------+----------+--------------+ PERO     Full                                                        +---------+---------------+---------+-----------+----------+--------------+   +---------+---------------+---------+-----------+----------+--------------+ LEFT     CompressibilityPhasicitySpontaneityPropertiesThrombus Aging +---------+---------------+---------+-----------+----------+--------------+ CFV      Full           Yes      Yes                                 +---------+---------------+---------+-----------+----------+--------------+ SFJ      Full                                                        +---------+---------------+---------+-----------+----------+--------------+  FV Prox  Full                                                        +---------+---------------+---------+-----------+----------+--------------+ FV Mid   Full                                                        +---------+---------------+---------+-----------+----------+--------------+ FV DistalFull                                                        +---------+---------------+---------+-----------+----------+--------------+ PFV      Full                                                        +---------+---------------+---------+-----------+----------+--------------+ POP      Partial        No       No                   Acute          +---------+---------------+---------+-----------+----------+--------------+ PTV      None                                         Acute           +---------+---------------+---------+-----------+----------+--------------+ PERO     None                                         Acute          +---------+---------------+---------+-----------+----------+--------------+ Gastroc  Full                                                        +---------+---------------+---------+-----------+----------+--------------+     Summary: RIGHT: - There is no evidence of deep vein thrombosis in the lower extremity.  - No cystic structure found in the popliteal fossa.  LEFT: - Findings consistent with acute deep vein thrombosis involving the left popliteal vein, left posterior tibial veins, and left peroneal veins.  - No cystic structure found in the popliteal fossa.  *See table(s) above for measurements and observations. Electronically signed by Lemar Livings MD on 09/05/2023 at 1:01:31 PM.    Final    CT Angio Chest PE W and/or Wo Contrast  Addendum Date: 09/04/2023   ADDENDUM REPORT: 09/04/2023 20:51 ADDENDUM: Critical Value/emergent results were called by telephone at the time of interpretation on 09/04/2023 at 8:47 pm to provider MATTHEW TRIFAN , who verbally acknowledged  these results. Electronically Signed   By: Genevive Bi M.D.   On: 09/04/2023 20:51   Result Date: 09/04/2023 CLINICAL DATA:  Bronchogenic carcinoma with pulmonary metastasis. Short of breath. Concern for pulmonary embolism. EXAM: CT ANGIOGRAPHY CHEST WITH CONTRAST TECHNIQUE: Multidetector CT imaging of the chest was performed using the standard protocol during bolus administration of intravenous contrast. Multiplanar CT image reconstructions and MIPs were obtained to evaluate the vascular anatomy. RADIATION DOSE REDUCTION: This exam was performed according to the departmental dose-optimization program which includes automated exposure control, adjustment of the mA and/or kV according to patient size and/or use of iterative reconstruction technique. CONTRAST:  80mL OMNIPAQUE IOHEXOL 350  MG/ML SOLN COMPARISON:  CT chest 08/14/2023 FINDINGS: Cardiovascular: Partially occlusive filling defects within the LEFT upper lobe pulmonary artery, lingula lower pulmonary artery and lower lobe pulmonary arteries. Large filling defect within the RIGHT lower lobe pulmonary arteries extending into the segmental branches of the pulmonary arteries. The RIGHT upper lobe pulmonary arteries constricted by a large suprahilar pulmonary mass. Overall clot burden is severe however there is no evidence of RIGHT ventricular strain (RIGHT ventricle to LEFT ventricle diameter less than 1) Mediastinum/Nodes: No axillary or supraclavicular adenopathy. No mediastinal or hilar adenopathy. No pericardial fluid. Esophagus normal. Lungs/Pleura: Again demonstrated RIGHT upper lobe 5.5 cm mass and RIGHT perihilar 5.6 cm mass. The perihilar mass is slightly decreased from 8.5 cm. Bilateral innumerable large pulmonary nodules not changed in the interval. No pneumothorax. Upper Abdomen: Limited view of the liver, kidneys, pancreas are unremarkable. Normal adrenal glands. Liver has a fine nodular contour Musculoskeletal: No aggressive osseous lesion. Review of the MIP images confirms the above findings. IMPRESSION: 1. Bilateral acute pulmonary emboli within the proximal pulmonary arteries. Overall clot burden is severe. No CT evidence of RIGHT ventricular strain. 2. RIGHT upper lobe and RIGHT perihilar masses consistent with primary bronchogenic carcinoma. 3. Innumerable bilateral pulmonary metastasis. Electronically Signed: By: Genevive Bi M.D. On: 09/04/2023 20:39   DG Chest 2 View  Result Date: 09/04/2023 CLINICAL DATA:  Shortness of breath and tachycardia. Concern for pneumonia. Right upper lobe mass. Non-small cell lung cancer. EXAM: CHEST - 2 VIEW COMPARISON:  Chest radiograph dated 08/14/2023. FINDINGS: Left subclavian catheter with tip at the cavoatrial junction. Persistent bilateral pulmonary opacities. No pleural effusion  or pneumothorax. The cardiac silhouette is within normal limits. No acute osseous pathology. IMPRESSION: Persistent bilateral pulmonary opacities. Electronically Signed   By: Elgie Collard M.D.   On: 09/04/2023 18:40   IR IMAGING GUIDED PORT INSERTION  Result Date: 09/01/2023 INDICATION: RIGHT lung cancer EXAM: IMPLANTED PORT A CATH PLACEMENT WITH ULTRASOUND AND FLUOROSCOPIC GUIDANCE MEDICATIONS: None; The antibiotic was administered within an appropriate time interval prior to skin puncture. ANESTHESIA/SEDATION: Moderate (conscious) sedation was employed during this procedure. A total of Versed 2 mg and Fentanyl 100 mcg was administered intravenously. Moderate Sedation Time: 26 minutes. The patient's level of consciousness and vital signs were monitored continuously by radiology nursing throughout the procedure under my direct supervision. FLUOROSCOPY TIME:  Fluoroscopic dose; 0 mGy COMPLICATIONS: None immediate. PROCEDURE: The procedure, risks, benefits, and alternatives were explained to the patient. Questions regarding the procedure were encouraged and answered. The patient understands and consents to the procedure. The LEFT neck and chest were prepped with chlorhexidine in a sterile fashion, and a sterile drape was applied covering the operative field. Maximum barrier sterile technique with sterile gowns and gloves were used for the procedure. A timeout was performed prior to the initiation of the  procedure. Local anesthesia was provided with 1% lidocaine with epinephrine. After creating a small venotomy incision, a micropuncture kit was utilized to access the internal jugular vein under direct, real-time ultrasound guidance. Ultrasound image documentation was performed. The microwire was kinked to measure appropriate catheter length. A subcutaneous port pocket was then created along the upper chest wall utilizing a combination of sharp and blunt dissection. The pocket was irrigated with sterile saline.  A single lumen power injectable port was chosen for placement. The 8 Fr catheter was tunneled from the port pocket site to the venotomy incision. The port was placed in the pocket. The external catheter was trimmed to appropriate length. At the venotomy, an 8 Fr peel-away sheath was placed over a guidewire under fluoroscopic guidance. The catheter was then placed through the sheath and the sheath was removed. Final catheter positioning was confirmed and documented with a fluoroscopic spot radiograph. The port was accessed with a Huber needle, aspirated and flushed with heparinized saline. The port pocket incision was closed with interrupted 3-0 Vicryl suture then Dermabond was applied, including at the venotomy incision. Dressings were placed. The patient tolerated the procedure well without immediate post procedural complication. IMPRESSION: Successful placement of a LEFT internal jugular approach power injectable Port-A-Cath. The tip of the catheter is positioned within the proximal RIGHT atrium. The catheter is ready for immediate use. Roanna Banning, MD [facility] Radiology Electronically Signed   By: Roanna Banning M.D.   On: 09/01/2023 11:44     PHYSICAL EXAM  Temp:  [97.5 F (36.4 C)-99.8 F (37.7 C)] 98.2 F (36.8 C) (11/23 1621) Pulse Rate:  [109-129] 109 (11/23 1621) Resp:  [20] 20 (11/22 2349) BP: (106-131)/(68-77) 126/77 (11/23 1621) SpO2:  [91 %-94 %] 92 % (11/23 1621)  General - cachectic, well developed, in no apparent distress.  Ophthalmologic - fundi not visualized due to noncooperation.  Cardiovascular - Regular rhythm and rate.  Neuro - awake, alert, eyes open, orientated to age, place, time and people. No aphasia, fluent language, following all simple commands. Able to name and repeat and read. No gaze palsy, tracking bilaterally, visual field full, PERRL. No facial droop. Tongue midline. Bilateral UEs 5/5, no drift. Bilaterally LEs  5/5, no drift. Sensation symmetrical bilaterally, b/l FTN intact, gait not tested.     ASSESSMENT/PLAN Ms. Tynley Hommes is a 62 y.o. 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  Put on Rocephin and azithromycin, symptom improved.  However, MRI showed new left occipital and left parietal microhemorrhages with surrounding edema.  Neurology consulted.   Cerebral micro bleeding - left parietal and left occipital CMBs, etiology unclear, CMB with surrounding edema due to eliquis use vs. Underlying metastasis, or late subacute infarct with petechial hemorrhagic conversion in the setting of hypercoagulable state MRI left parietal and left occipital micro bleeding surrounding edema and left occipital region CT head small area of hypodensity in the left occipital lobe with minimal hyperdensity correlation with a small focus of hemorrhage and associated edema noted on the prior MRI. CT repeat stable finding CT head and neck no LVO, up to moderate stenosis of nondominant right VA 2D Echo EF 60 to 65% LDL pending HgbA1c pending Heparin subcu for VTE prophylaxis Eliquis (apixaban) daily prior to admission, now Eliquis on hold.  Will plan to resume tomorrow p.m if neuro stable. Ongoing aggressive stroke risk factor management  Therapy recommendations: Pending Disposition: Pending  Lung cancer with metastasis PE and DVT due to hypercoagulable state 11/12 Chest CT right upper lobe and right suprahilar masses are unchanged.  Numerous bilateral pulmonary metastases are unchanged. 11/23 CT head and neck superimposed left palate teen tonsillar enhancing mass, up to 2.3 cm. 11/04 chest CTA bilateral acute PE 11/05 LE venous Doppler acute DVT involving left popliteal vein, left posterior tibial veins and left peroneal veins. On Eliquis PTA.  Now Eliquis on hold, will plan to  resume Eliquis tomorrow p.m. if neuro stable. On Decadron  Hypertension Stable Long term BP goal normotensive  Hyperlipidemia Home meds: None LDL pending, goal < 70 Will consider statin if needed  Other Stroke Risk Factors   Other Active Problems Anemia due to chronic disease, hemoglobin 6.8--PRBC--7.8--9.1  Hospital day # 5  I discussed with Dr. Ophelia Charter. I spent additional 30 minutes inpatient face-to-face time with the patient, more than 50% of which was spent in counseling and coordination of care, reviewing test results, images and medication, and discussing the diagnosis, treatment plan and potential prognosis. This patient's care requiresreview of multiple databases, neurological assessment, discussion with family, other specialists and medical decision making of high complexity.   Marvel Plan, MD PhD Stroke Neurology 09/23/2023 5:20 PM    To contact Stroke Continuity provider, please refer to [email or web address]. After hours, contact General Neurology

## 2023-09-23 NOTE — Plan of Care (Signed)

## 2023-09-24 DIAGNOSIS — R6510 Systemic inflammatory response syndrome (SIRS) of non-infectious origin without acute organ dysfunction: Secondary | ICD-10-CM | POA: Diagnosis not present

## 2023-09-24 DIAGNOSIS — I63412 Cerebral infarction due to embolism of left middle cerebral artery: Secondary | ICD-10-CM

## 2023-09-24 LAB — LIPID PANEL
Cholesterol: 122 mg/dL (ref 0–200)
HDL: 31 mg/dL — ABNORMAL LOW (ref >40)
LDL Cholesterol: 67 mg/dL (ref 0–99)
Total CHOL/HDL Ratio: 3.9 {ratio}
Triglycerides: 118 mg/dL (ref <150)
VLDL: 24 mg/dL (ref 0–40)

## 2023-09-24 LAB — CBC WITH DIFFERENTIAL/PLATELET
Abs Immature Granulocytes: 0.04 10*3/uL (ref 0.00–0.07)
Basophils Absolute: 0 10*3/uL (ref 0.0–0.1)
Basophils Relative: 0 %
Eosinophils Absolute: 0 10*3/uL (ref 0.0–0.5)
Eosinophils Relative: 0 %
HCT: 26.1 % — ABNORMAL LOW (ref 36.0–46.0)
Hemoglobin: 7.9 g/dL — ABNORMAL LOW (ref 12.0–15.0)
Immature Granulocytes: 1 %
Lymphocytes Relative: 3 %
Lymphs Abs: 0.2 10*3/uL — ABNORMAL LOW (ref 0.7–4.0)
MCH: 26.5 pg (ref 26.0–34.0)
MCHC: 30.3 g/dL (ref 30.0–36.0)
MCV: 87.6 fL (ref 80.0–100.0)
Monocytes Absolute: 0.3 10*3/uL (ref 0.1–1.0)
Monocytes Relative: 4 %
Neutro Abs: 6.4 10*3/uL (ref 1.7–7.7)
Neutrophils Relative %: 92 %
Platelets: 288 10*3/uL (ref 150–400)
RBC: 2.98 MIL/uL — ABNORMAL LOW (ref 3.87–5.11)
RDW: 14.9 % (ref 11.5–15.5)
WBC: 6.9 10*3/uL (ref 4.0–10.5)
nRBC: 0 % (ref 0.0–0.2)

## 2023-09-24 LAB — BASIC METABOLIC PANEL
Anion gap: 10 (ref 5–15)
BUN: 8 mg/dL (ref 8–23)
CO2: 27 mmol/L (ref 22–32)
Calcium: 8.6 mg/dL — ABNORMAL LOW (ref 8.9–10.3)
Chloride: 102 mmol/L (ref 98–111)
Creatinine, Ser: 0.54 mg/dL (ref 0.44–1.00)
GFR, Estimated: >60 mL/min (ref >60)
Glucose, Bld: 137 mg/dL — ABNORMAL HIGH (ref 70–99)
Potassium: 3 mmol/L — ABNORMAL LOW (ref 3.5–5.1)
Sodium: 139 mmol/L (ref 135–145)

## 2023-09-24 LAB — HEMOGLOBIN A1C
Hgb A1c MFr Bld: 5.5 % (ref 4.8–5.6)
Mean Plasma Glucose: 111.15 mg/dL

## 2023-09-24 MED ORDER — APIXABAN 5 MG PO TABS
5.0000 mg | ORAL_TABLET | Freq: Two times a day (BID) | ORAL | Status: DC
  Administered 2023-09-24 – 2023-09-25 (×2): 5 mg via ORAL
  Filled 2023-09-24 (×2): qty 1

## 2023-09-24 MED ORDER — POTASSIUM CHLORIDE 20 MEQ PO PACK
40.0000 meq | PACK | ORAL | Status: AC
  Administered 2023-09-24 (×2): 40 meq via ORAL
  Filled 2023-09-24 (×2): qty 2

## 2023-09-24 MED ORDER — ENSURE ENLIVE PO LIQD
237.0000 mL | Freq: Two times a day (BID) | ORAL | Status: DC
  Administered 2023-09-25: 237 mL via ORAL

## 2023-09-24 MED ORDER — POTASSIUM CHLORIDE CRYS ER 20 MEQ PO TBCR
40.0000 meq | EXTENDED_RELEASE_TABLET | ORAL | Status: DC
  Filled 2023-09-24: qty 2

## 2023-09-24 NOTE — Progress Notes (Signed)
Progress Note   Patient: Rebecca Ochoa ZOX:[id number] DOB: 04-08-1961 DOA: 09/18/2023     6 DOS: the patient was seen and examined on 09/24/2023   Brief hospital course: 62 y.o. 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 No source identified. Patient started empirically on Ceftriaxone and azithromycin and is improving. During hospitalization, patient developed persistent headache. 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 She is scheduled for repeat head CT on 11/23 with likely need for repeat contrasted MRI for further evaluation of possible metastasis.  CTA neck with tonsillar mass, will need ENT evaluation.  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 Arbutus Ped as an outpatient She has completed radiation and planned to start Tagrisso but has had delays MRI shows concern for brain mets and recommends f/u scan in 3 months  CTA shows tonsillar mass - Dr. Arbutus Ped recommends ENT consult and I called and spoke with Dr. Jenne Pane, who recommends outpatient referral for expedited work up Patient is concerned about this issue and requested inpatient consult with Dr. Jenne Pane but he assures me that she will be quickly followed up in the clinic Patient also requests to see Dr. Arbutus Ped prior to dc; inbox message sent  ICH Microbleeding/small focus of hemorrhage associated with eema Repeat CT and CTA reassuring Neurology consulting  Will need repeat MRI brain in 3-4 weeks Per neurology, can resume Eliquis tonight Will monitor  overnight for 1 additional night to ensure no new neurologic issues after resuming Eliquis  Hypokalemia Replete and follow BMP   Anemia of chronic disease Hgb down to 6.8 Transfused 1 unit PRBC with slow improvement Now back to baseline   History of pulmonary embolism PE diagnosed on 11/4 Patient has been on Eliquis for management Repeat CTA chest on 11/12 showed interval partial clearing. Held Eliquis in setting of possible acute brain hemorrhage Dr. Roda Shutters has recommended resuming Eliquis tonight   Leukopenia Driven by lymphocytopenia. Normal ANC.   Severe malnutrition Nutrition Problem: Severe Malnutrition Etiology: chronic illness, cancer and cancer related treatments Signs/Symptoms: moderate fat depletion, mild muscle depletion, energy intake < or equal to 75% for > or equal to 1 month, percent weight loss Interventions: Prostat, Education   Anxiety Continue prn alprazolam        Consultants:  Neurology TOC team Nutrition   Procedures:  None   Antimicrobials: Ceftriaxone  11/18-23 Azithromycin 11/18-22   30 Day Unplanned Readmission Risk Score    Flowsheet Row ED to Hosp-Admission (Current) from 09/18/2023 in [facility]  30 Day Unplanned Readmission Risk Score (%) 35.68 Filed at 09/24/2023 1200       This score is the patient's risk of an unplanned readmission within 30 days of being discharged (0 -100%). The score is based on dignosis, age, lab data, medications, orders, and past utilization.   Low:  0-14.9   Medium: 15-21.9   High: 22-29.9   Extreme: 30 and above           Subjective: Feeling overwhelmed.  Mild headache today.  Thinking she would decline a tonsillar biopsy but would like to  see ENT.  Also wants to speak with Dr. Arbutus Ped.  She is worried about not starting chemo, about starting Eliquis, about how fast this is evolving.   Objective: Vitals:   09/23/23 2307 09/24/23 0313  BP: 120/71 129/77  Pulse: (!) 102 (!)  101  Resp: 18 18  Temp: 97.6 F (36.4 C) 97.9 F (36.6 C)  SpO2: 92% 91%   No intake or output data in the 24 hours ending 09/24/23 1424 Filed Weights   09/18/23 1519 09/18/23 2029 09/19/23 0857  Weight: 73.5 kg 73.6 kg 73.5 kg    Exam:  General:  Appears calm and comfortable and is in NAD Eyes:  EOMI, normal lids, iris ENT:  grossly normal hearing, lips & tongue, mmm Neck:  no LAD, masses or thyromegaly Cardiovascular:  RRR, no m/r/g. No LE edema.  Respiratory:   CTA bilaterally with no wheezes/rales/rhonchi.  Normal respiratory effort. Abdomen:  soft, NT, ND Skin:  no rash or induration seen on limited exam Musculoskeletal:  grossly normal tone BUE/BLE, good ROM, no bony abnormality Psychiatric:  blunted mood and affect, speech fluent and appropriate, AOx3 Neurologic:  CN 2-12 grossly intact, moves all extremities in coordinated fashion  Data Reviewed: I have reviewed the patient's lab results since admission.  Pertinent labs for today include:    K+ 3.0 Glucose 137 Lipids: 122/31/67/118 WBC 6.9 Hgb 7.9 A1c 5.5     Family Communication: Partner was present throughout evaluation  Disposition: Status is: Inpatient Remains inpatient appropriate because: ongoing evaluation and management     Time spent: 50 minutes  Unresulted Labs (From admission, onward)     Start     Ordered   Unscheduled  Occult blood card to lab, stool  As needed,   R      09/20/23 1130             Author: Jonah Blue, MD 09/24/2023 2:24 PM  For on call review [email or web address].

## 2023-09-24 NOTE — Progress Notes (Signed)
STROKE TEAM PROGRESS NOTE   SUBJECTIVE (INTERVAL HISTORY) Her husband is at the bedside.  Pt reclining in bed, no acute event overnight, neuro stable. Will restart eliquis tonight.    OBJECTIVE Temp:  [97.6 F (36.4 C)-98.2 F (36.8 C)] 97.9 F (36.6 C) (11/24 0313) Pulse Rate:  [101-113] 101 (11/24 0313) Cardiac Rhythm: Normal sinus rhythm (11/24 0700) Resp:  [18] 18 (11/24 0313) BP: (116-129)/(68-77) 129/77 (11/24 0313) SpO2:  [91 %-93 %] 91 % (11/24 0313)  No results for input(s): "GLUCAP" in the last 168 hours. Recent Labs  Lab 09/18/23 1326 09/19/23 0340 09/20/23 0307 09/21/23 0444 09/21/23 0700 09/24/23 0500  NA 131* 133* 135 137  --  139  K 3.9 3.2* 3.4* 3.4*  --  3.0*  CL 96* 102 105 105  --  102  CO2 24 24 23 23   --  27  GLUCOSE 108* 136* 108* 95  --  137*  BUN 10 9 7* 6*  --  8  CREATININE 0.71 0.65 0.65 0.59  --  0.54  CALCIUM 8.4* 8.0* 8.0* 8.2*  --  8.6*  MG  --   --  2.0  --  2.0  --    Recent Labs  Lab 09/18/23 1326 09/21/23 0444  AST 46*  --   ALT 20  --   ALKPHOS 124  --   BILITOT 1.4* 0.7  PROT 6.7  --   ALBUMIN 2.7*  --    Recent Labs  Lab 09/18/23 1326 09/19/23 0340 09/20/23 0307 09/21/23 0444 09/22/23 0521 09/23/23 0640 09/24/23 0500  WBC 7.0   < > 2.5* 3.0* 3.3* 5.5 6.9  NEUTROABS 6.2  --  1.9  --   --   --  6.4  HGB 8.8*   < > 7.9* 7.7* 7.8* 9.1* 7.9*  HCT 29.1*   < > 25.5* 24.8* 25.8* 30.2* 26.1*  MCV 89.0   < > 88.9 88.6 90.5 87.3 87.6  PLT 282   < > 220 205 217 315 288   < > = values in this interval not displayed.   No results for input(s): "CKTOTAL", "CKMB", "CKMBINDEX", "TROPONINI" in the last 168 hours. No results for input(s): "LABPROT", "INR" in the last 72 hours. No results for input(s): "COLORURINE", "LABSPEC", "PHURINE", "GLUCOSEU", "HGBUR", "BILIRUBINUR", "KETONESUR", "PROTEINUR", "UROBILINOGEN", "NITRITE", "LEUKOCYTESUR" in the last 72 hours.  Invalid input(s): "APPERANCEUR"     Component Value Date/Time    CHOL 122 09/24/2023 0500   TRIG 118 09/24/2023 0500   HDL 31 (L) 09/24/2023 0500   CHOLHDL 3.9 09/24/2023 0500   VLDL 24 09/24/2023 0500   LDLCALC 67 09/24/2023 0500   LDLCALC 90 10/12/2022 1442   Lab Results  Component Value Date   HGBA1C 5.5 09/24/2023   No results found for: "LABOPIA", "COCAINSCRNUR", "LABBENZ", "AMPHETMU", "THCU", "LABBARB"  No results for input(s): "ETH" in the last 168 hours.  I have personally reviewed the radiological images below and agree with the radiology interpretations.  CT ANGIO HEAD NECK W WO CM  Result Date: 09/23/2023 CLINICAL DATA:  62 year old female with headache. Small areas of left occipital lobe and left parietal lobe hemorrhage and edema on the MRI yesterday. History of lung cancer. EXAM: CT ANGIOGRAPHY HEAD AND NECK WITH AND WITHOUT CONTRAST TECHNIQUE: Multidetector CT imaging of the head and neck was performed using the standard protocol during bolus administration of intravenous contrast. Multiplanar CT image reconstructions and MIPs were obtained to evaluate the vascular anatomy. Carotid stenosis measurements (when applicable) are  obtained utilizing NASCET criteria, using the distal internal carotid diameter as the denominator. RADIATION DOSE REDUCTION: This exam was performed according to the departmental dose-optimization program which includes automated exposure control, adjustment of the mA and/or kV according to patient size and/or use of iterative reconstruction technique. CONTRAST:  75mL OMNIPAQUE IOHEXOL 350 MG/ML SOLN COMPARISON:  Brain MRI and noncontrast head CT yesterday. FINDINGS: CT HEAD Brain: There remains no intracranial mass effect or ventriculomegaly. Mild left occipital pole edema is stable (series 4, image 17), but the left parietal findings and the underlying microhemorrhage at both areas is not apparent by CT. No new intracranial abnormality. Calvarium and skull base: No acute or suspicious osseous lesion. Paranasal sinuses:  Visualized paranasal sinuses and mastoids are stable and well aerated. Orbits: Leftward gaze, otherwise negative orbits and scalp. CTA NECK Skeleton: Age-appropriate cervical spine degeneration. No acute or suspicious osseous lesion. Upper chest: Upper lung mass is greater on the right. CT CTA chest 09/12/2023. Other neck: Heterogeneously enhancing left tonsillar pillar mass, 16 x 21 x 23 mm (AP by transverse by CC) at the level of the uvula. Left level 2 lymph nodes remain within normal limits. No parapharyngeal or retropharyngeal space inflammation. No other neck mass or lymphadenopathy. Aortic arch: Calcified aortic atherosclerosis.  3 vessel arch. Right carotid system: Mild for age atherosclerosis and no stenosis. Left carotid system: Left CCA origin plaque without stenosis. Mild soft plaque in the left CCA and at the left ICA origin without significant stenosis. Vertebral arteries: Mild proximal right subclavian artery plaque without stenosis. Right vertebral artery origin calcified plaque with up to moderate stenosis series 13, image 71. Right vertebral remains patent to the skull base with no additional plaque or stenosis and is non dominant. Proximal left subclavian artery plaque without stenosis. Calcified plaque at the left vertebral artery origin but only mild stenosis on series 13, image 67. Dominant left vertebral. Mildly tortuous. No additional stenosis to the skull base. CTA HEAD Posterior circulation: Distal vertebral arteries and vertebrobasilar junction are patent without stenosis. Left V4 is dominant. Normal PICA origins. Patent basilar artery with calcified plaque on series 12, image 122. But no basilar stenosis. Patent SCA and left PCA origins. Fetal type right PCA origin. Bilateral PCA branches are within normal limits. Anterior circulation: Both ICA siphons are patent. Left siphon anterior genu calcified plaque with only mild stenosis. Right siphon cavernous segment calcified plaque with only  mild stenosis. Normal right posterior communicating artery origin. Patent carotid termini. Normal MCA and ACA origins. Dominant right A1. Normal anterior communicating artery. Bilateral ACA branches are within normal limits. Bilateral MCA branches are within normal limits. Venous sinuses: Patent. Anatomic variants: Dominant left vertebral artery. Fetal type right PCA origin. Dominant right A1. Other findings: No abnormal brain enhancement is identified. Review of the MIP images confirms the above findings IMPRESSION: 1. Upper lung masses as detailed on 09/12/2023 chest CTA. Superimposed left palatine tonsillar enhancing mass, up to 2.3 cm. No malignant cervical lymphadenopathy, but the constellation raises the possibility of metastatic head and neck cancer or synchronous lung and tonsillar primaries. 2. Negative for large vessel occlusion. 3. Up to moderate atherosclerotic stenosis of the non dominant Right Vertebral Artery. Dominant Left Vertebral Artery with no stenosis. 4. No other hemodynamically significant arterial stenosis. 5. Stable CT appearance of the brain since yesterday. No new intracranial abnormality. 6.  Aortic Atherosclerosis (ICD10-I70.0). Electronically Signed   By: Odessa Fleming M.D.   On: 09/23/2023 10:59   CT HEAD WO CONTRAST (  )  Result Date: 09/22/2023 CLINICAL DATA:  [age] and severe headache, metastatic disease suspected EXAM: CT HEAD WITHOUT CONTRAST TECHNIQUE: Contiguous axial images were obtained from the base of the skull through the vertex without intravenous contrast. RADIATION DOSE REDUCTION: This exam was performed according to the departmental dose-optimization program which includes automated exposure control, adjustment of the mA and/or kV according to patient size and/or use of iterative reconstruction technique. COMPARISON:  No prior CT head available 09/22/2023 MRI head FINDINGS: Brain: Small area of hypodensity in the left occipital lobe, with minimal hyperdensity, which  likely correlates to the small focus of hemorrhage with associated edema noted on the prior MRI. The smaller focus of edema in the left parietal lobe does not have as clear a correlate, but the hemorrhage may correlate with a small area of hyperdensity (series 6, image 33). No definite mass. No evidence of acute infarction, mass effect, or midline shift. No hydrocephalus. Vascular: No hyperdense vessel. Skull: Negative for fracture or focal lesion. Sinuses/Orbits: No acute finding. Other: The mastoid air cells are well aerated. IMPRESSION: 1. Small area of hypodensity in the left occipital lobe, with minimal hyperdensity, correlates with the small focus of hemorrhage and associated edema noted on the prior MRI. 2. The smaller focus of edema in the left parietal lobe does not have as clear a correlate on CT, but the hemorrhage may correlate with a small area of hyperdensity. No definite mass. 3. Process. No additional acute intracranial Electronically Signed   By: Wiliam Ke M.D.   On: 09/22/2023 21:52   MR BRAIN W WO CONTRAST  Result Date: 09/22/2023 CLINICAL DATA:  Headache EXAM: MRI HEAD WITHOUT AND WITH CONTRAST TECHNIQUE: Multiplanar, multiecho pulse sequences of the brain and surrounding structures were obtained without and with intravenous contrast. CONTRAST:  7mL GADAVIST GADOBUTROL 1 MMOL/ML IV SOLN COMPARISON:  Brain MR 08/05/2023 FINDINGS: Brain: Negative for an acute infarct. No hemorrhage. No hydrocephalus. No extra-axial fluid collection. There is a background of mild chronic microvascular ischemic change. Compared to prior exam there is a new focus of susceptibility artifact in the left occipital lobe with surrounding cerebral edema. There is no contrast enhancement associated with this site of hemorrhage, but an underlying metastatic lesion is not excluded. Additional similar focus of susceptibility artifact in the left parietal lobe (series 10, image 53). Recommend follow up contrast-enhanced  brain MRI in 3 months to assess for interval change. Vascular: Normal flow voids. Skull and upper cervical spine: Normal marrow signal. Sinuses/Orbits: Middle ear or mastoid effusion. Paranasal sinuses are clear. Orbits are unremarkable. Other: None. IMPRESSION: Compared to prior exam there are new small foci of hemorrhage in the left occipital lobe and left parietal lobe with surrounding cerebral edema. There is no contrast enhancement associated with sites. These are nonspecific, but metastatic disease is not excluded. Recommend follow up contrast-enhanced brain MRI in 3 months to assess for interval change. Electronically Signed   By: Lorenza Cambridge M.D.   On: 09/22/2023 17:19   US Abdomen Limited RUQ (LIVER/GB)  Result Date: 09/18/2023 CLINICAL DATA:  Elevated liver function tests. EXAM: ULTRASOUND ABDOMEN LIMITED RIGHT UPPER QUADRANT COMPARISON:  None Available. FINDINGS: Gallbladder: The gallbladder is contracted. No gallstones or wall thickening visualized (1.9 mm). No sonographic Murphy sign noted by sonographer. Common bile duct: Diameter: 3.1 mm Liver: Heterogeneous echogenic masslike lesions are seen within the left lobe of the liver. The largest measures approximately 5.3 cm x 4.6 cm x 3.4 cm. Diffusely increased echogenicity of the  liver parenchyma is noted. Portal vein is patent on color Doppler imaging with normal direction of blood flow towards the liver. Other: None. IMPRESSION: 1. Hepatic steatosis with multiple large heterogeneous liver lesions, concerning for the presence of hepatic metastasis. Electronically Signed   By: Aram Candela M.D.   On: 09/18/2023 21:53   DG Chest 2 View  Result Date: 09/18/2023 CLINICAL DATA:  Shortness of breath. Nonproductive cough. Suspected Sepsis EXAM: CHEST - 2 VIEW COMPARISON:  09/04/2023. FINDINGS: Redemonstration of multiple lobulated opacities throughout bilateral lungs, right more than left, compatible with patient's known history of pulmonary  metastasis. No acute consolidation or lung collapse seen. Bilateral costophrenic angles are clear. Note is made of elevated right hemidiaphragm. Normal cardio-mediastinal silhouette. No acute osseous abnormalities. The soft tissues are within normal limits. Left-sided CT Port-A-Cath is again seen with its tip overlying the cavoatrial junction region. IMPRESSION: *No active cardiopulmonary disease. *Redemonstration of extensive lung opacities, compatible with metastases. Electronically Signed   By: Jules Schick M.D.   On: 09/18/2023 16:00   CT Angio Chest PE W/Cm &/Or Wo Cm  Result Date: 09/12/2023 CLINICAL DATA:  Bilateral pulmonary emboli diagnosis 09/04/2023. Bilateral pulmonary metastasis. RIGHT upper lobe pulmonary masses. * Tracking Code: BO * EXAM: CT ANGIOGRAPHY CHEST WITH CONTRAST TECHNIQUE: Multidetector CT imaging of the chest was performed using the standard protocol during bolus administration of intravenous contrast. Multiplanar CT image reconstructions and MIPs were obtained to evaluate the vascular anatomy. RADIATION DOSE REDUCTION: This exam was performed according to the departmental dose-optimization program which includes automated exposure control, adjustment of the mA and/or kV according to patient size and/or use of iterative reconstruction technique. CONTRAST:  75mL OMNIPAQUE IOHEXOL 350 MG/ML SOLN COMPARISON:  CT 09/04/2023 FINDINGS: Cardiovascular: Interval partial clearing of the clot burden from the RIGHT lower lobe pulmonary artery thromboemboli. No significant clearing of the LEFT lower lobe pulmonary emboli. Interval clearing of the thrombus in the lingular pulmonary artery. No evidence of new pulmonary emboli. No RIGHT ventricular strain. Mediastinum/Nodes: RIGHT suprahilar mass encroaches upon the mediastinum. No supraclavicular adenopathy. No pericardial fluid. Lungs/Pleura: Numeral bilateral pulmonary metastasis not changed in short a day interval. Large RIGHT upper lobe  pulmonary mass measuring 6. 5.5 cm is unchanged. RIGHT suprahilar mass measuring 5.4 cm is unchanged. Limited view of the liver, kidneys, pancreas are unremarkable. Normal adrenal glands. Upper Abdomen: Limited view of the liver, kidneys, pancreas are unremarkable. Normal adrenal glands. Musculoskeletal: No aggressive osseous lesion. Review of the MIP images confirms the above findings. IMPRESSION: 1. Interval partial clearing of the acute pulmonary thromboemboli diagnosed on 09/04/2023. 2. No evidence of new pulmonary emboli. No RIGHT ventricular strain. 3. RIGHT upper lobe and RIGHT suprahilar masses are unchanged. 4. Numerous bilateral pulmonary metastasis are unchanged. Electronically Signed   By: Genevive Bi M.D.   On: 09/12/2023 15:09   ECHOCARDIOGRAM COMPLETE  Result Date: 09/05/2023    ECHOCARDIOGRAM REPORT   Patient Name:   TARSHA MARCY Date of Exam: 09/05/2023 Medical Rec #:  [id number]    Height:       65.0 in Accession #:    [id number]   Weight:       165.1 lb Date of Birth:  [birth year]     BSA:          1.823 m Patient Age:    62 years     BP:           142/73 mmHg Patient Gender: F  HR:           113 bpm. Exam Location:  Inpatient Procedure: 2D Echo, Cardiac Doppler and Color Doppler Indications:    pulmonary embolus  History:        Patient has no prior history of Echocardiogram examinations.                 COPD, Arrythmias:Tachycardia; Risk Factors:Hypertension and                 Dyslipidemia.  Sonographer:    Karma Ganja Referring Phys: [id number] PROSPER M AMPONSAH  Sonographer Comments: Suboptimal parasternal window. Image acquisition challenging due to COPD. IMPRESSIONS  1. Left ventricular ejection fraction, by estimation, is 60 to 65%. The left ventricle has normal function. The left ventricle has no regional wall motion abnormalities. Indeterminate diastolic filling due to E-A fusion.  2. Right ventricular systolic function is normal. The right ventricular size is normal.  Tricuspid regurgitation signal is inadequate for assessing PA pressure.  3. The mitral valve was not well visualized. No evidence of mitral valve regurgitation.  4. The aortic valve was not well visualized. Aortic valve regurgitation is not visualized. No aortic stenosis is present.  5. The inferior vena cava is normal in size with greater than 50% respiratory variability, suggesting right atrial pressure of 3 mmHg. FINDINGS  Left Ventricle: Left ventricular ejection fraction, by estimation, is 60 to 65%. The left ventricle has normal function. The left ventricle has no regional wall motion abnormalities. Definity contrast agent was given IV to delineate the left ventricular  endocardial borders. The left ventricular internal cavity size was normal in size. There is no left ventricular hypertrophy. Indeterminate diastolic filling due to E-A fusion. Right Ventricle: The right ventricular size is normal. No increase in right ventricular wall thickness. Right ventricular systolic function is normal. Tricuspid regurgitation signal is inadequate for assessing PA pressure. Left Atrium: Left atrial size was not well visualized. Right Atrium: Right atrial size was not well visualized. Pericardium: There is no evidence of pericardial effusion. Presence of epicardial fat layer. Mitral Valve: The mitral valve was not well visualized. No evidence of mitral valve regurgitation. Tricuspid Valve: The tricuspid valve is grossly normal. Tricuspid valve regurgitation is not demonstrated. No evidence of tricuspid stenosis. Aortic Valve: The aortic valve was not well visualized. Aortic valve regurgitation is not visualized. No aortic stenosis is present. Aortic valve mean gradient measures 4.0 mmHg. Aortic valve peak gradient measures 8.3 mmHg. Aortic valve area, by VTI measures 1.77 cm. Pulmonic Valve: The pulmonic valve was not well visualized. Pulmonic valve regurgitation is not visualized. Aorta: The aortic root is normal in size  and structure. Venous: The inferior vena cava is normal in size with greater than 50% respiratory variability, suggesting right atrial pressure of 3 mmHg. IAS/Shunts: The interatrial septum was not well visualized.  LEFT VENTRICLE PLAX 2D LVIDd:         4.50 cm     Diastology LVIDs:         3.70 cm     LV e' medial:    6.42 cm/s LV PW:         0.90 cm     LV E/e' medial:  9.9 LV IVS:        1.00 cm     LV e' lateral:   7.51 cm/s LVOT diam:     2.00 cm     LV E/e' lateral: 8.4 LV SV:  37 LV SV Index:   20 LVOT Area:     3.14 cm  LV Volumes (MOD) LV vol d, MOD A2C: 37.7 ml LV vol d, MOD A4C: 64.4 ml LV vol s, MOD A2C: 8.0 ml LV vol s, MOD A4C: 29.2 ml LV SV MOD A2C:     29.7 ml LV SV MOD A4C:     64.4 ml LV SV MOD BP:      33.5 ml RIGHT VENTRICLE            IVC RV Basal diam:  2.70 cm    IVC diam: 1.50 cm RV S prime:     9.03 cm/s LEFT ATRIUM           Index        RIGHT ATRIUM           Index LA diam:      3.10 cm 1.70 cm/m   RA Area:     12.10 cm LA Vol (A2C): 24.8 ml 13.60 ml/m  RA Volume:   26.50 ml  14.53 ml/m LA Vol (A4C): 30.8 ml 16.89 ml/m  AORTIC VALVE AV Area (Vmax):    2.04 cm AV Area (Vmean):   1.83 cm AV Area (VTI):     1.77 cm AV Vmax:           144.00 cm/s AV Vmean:          95.000 cm/s AV VTI:            0.210 m AV Peak Grad:      8.3 mmHg AV Mean Grad:      4.0 mmHg LVOT Vmax:         93.40 cm/s LVOT Vmean:        55.300 cm/s LVOT VTI:          0.118 m LVOT/AV VTI ratio: 0.56  AORTA Ao Root diam: 3.10 cm MITRAL VALVE MV Area (PHT): 5.88 cm    SHUNTS MV Decel Time: 129 msec    Systemic VTI:  0.12 m MV E velocity: 63.40 cm/s  Systemic Diam: 2.00 cm MV A velocity: 86.10 cm/s MV E/A ratio:  0.74 Lennie Odor MD Electronically signed by Lennie Odor MD Signature Date/Time: 09/05/2023/1:37:27 PM    Final    VAS [place] LOWER EXTREMITY VENOUS (DVT)  Result Date: 09/05/2023  Lower Venous DVT Study Patient Name:  SERETHA IMGRUND  Date of Exam:   09/05/2023 Medical Rec #: [id number]     Accession #:     [id number] Date of Birth: [birth year]      Patient Gender: F Patient Age:   89 years Exam Location:  [facility] Procedure:      VAS [place] LOWER EXTREMITY VENOUS (DVT) Referring Phys: PRANAV PATEL --------------------------------------------------------------------------------  Indications: Edema, and pulmonary embolism.  Risk Factors: Cancer. Anticoagulation: Heparin. Comparison Study: No prior studies. Performing Technologist: Chanda Busing RVT  Examination Guidelines: A complete evaluation includes B-mode imaging, spectral Doppler, color Doppler, and power Doppler as needed of all accessible portions of each vessel. Bilateral testing is considered an integral part of a complete examination. Limited examinations for reoccurring indications may be performed as noted. The reflux portion of the exam is performed with the patient in reverse Trendelenburg.  +---------+---------------+---------+-----------+----------+--------------+ RIGHT    CompressibilityPhasicitySpontaneityPropertiesThrombus Aging +---------+---------------+---------+-----------+----------+--------------+ CFV      Full           Yes      Yes                                 +---------+---------------+---------+-----------+----------+--------------+  SFJ      Full                                                        +---------+---------------+---------+-----------+----------+--------------+ FV Prox  Full                                                        +---------+---------------+---------+-----------+----------+--------------+ FV Mid   Full                                                        +---------+---------------+---------+-----------+----------+--------------+ FV DistalFull                                                        +---------+---------------+---------+-----------+----------+--------------+ PFV      Full                                                         +---------+---------------+---------+-----------+----------+--------------+ POP      Full           Yes      Yes                                 +---------+---------------+---------+-----------+----------+--------------+ PTV      Full                                                        +---------+---------------+---------+-----------+----------+--------------+ PERO     Full                                                        +---------+---------------+---------+-----------+----------+--------------+   +---------+---------------+---------+-----------+----------+--------------+ LEFT     CompressibilityPhasicitySpontaneityPropertiesThrombus Aging +---------+---------------+---------+-----------+----------+--------------+ CFV      Full           Yes      Yes                                 +---------+---------------+---------+-----------+----------+--------------+ SFJ      Full                                                        +---------+---------------+---------+-----------+----------+--------------+  FV Prox  Full                                                        +---------+---------------+---------+-----------+----------+--------------+ FV Mid   Full                                                        +---------+---------------+---------+-----------+----------+--------------+ FV DistalFull                                                        +---------+---------------+---------+-----------+----------+--------------+ PFV      Full                                                        +---------+---------------+---------+-----------+----------+--------------+ POP      Partial        No       No                   Acute          +---------+---------------+---------+-----------+----------+--------------+ PTV      None                                         Acute           +---------+---------------+---------+-----------+----------+--------------+ PERO     None                                         Acute          +---------+---------------+---------+-----------+----------+--------------+ Gastroc  Full                                                        +---------+---------------+---------+-----------+----------+--------------+     Summary: RIGHT: - There is no evidence of deep vein thrombosis in the lower extremity.  - No cystic structure found in the popliteal fossa.  LEFT: - Findings consistent with acute deep vein thrombosis involving the left popliteal vein, left posterior tibial veins, and left peroneal veins.  - No cystic structure found in the popliteal fossa.  *See table(s) above for measurements and observations. Electronically signed by Lemar Livings MD on 09/05/2023 at 1:01:31 PM.    Final    CT Angio Chest PE W and/or Wo Contrast  Addendum Date: 09/04/2023   ADDENDUM REPORT: 09/04/2023 20:51 ADDENDUM: Critical Value/emergent results were called by telephone at the time of interpretation on 09/04/2023 at 8:47 pm to provider MATTHEW TRIFAN , who verbally acknowledged  these results. Electronically Signed   By: Genevive Bi M.D.   On: 09/04/2023 20:51   Result Date: 09/04/2023 CLINICAL DATA:  Bronchogenic carcinoma with pulmonary metastasis. Short of breath. Concern for pulmonary embolism. EXAM: CT ANGIOGRAPHY CHEST WITH CONTRAST TECHNIQUE: Multidetector CT imaging of the chest was performed using the standard protocol during bolus administration of intravenous contrast. Multiplanar CT image reconstructions and MIPs were obtained to evaluate the vascular anatomy. RADIATION DOSE REDUCTION: This exam was performed according to the departmental dose-optimization program which includes automated exposure control, adjustment of the mA and/or kV according to patient size and/or use of iterative reconstruction technique. CONTRAST:  80mL OMNIPAQUE IOHEXOL 350  MG/ML SOLN COMPARISON:  CT chest 08/14/2023 FINDINGS: Cardiovascular: Partially occlusive filling defects within the LEFT upper lobe pulmonary artery, lingula lower pulmonary artery and lower lobe pulmonary arteries. Large filling defect within the RIGHT lower lobe pulmonary arteries extending into the segmental branches of the pulmonary arteries. The RIGHT upper lobe pulmonary arteries constricted by a large suprahilar pulmonary mass. Overall clot burden is severe however there is no evidence of RIGHT ventricular strain (RIGHT ventricle to LEFT ventricle diameter less than 1) Mediastinum/Nodes: No axillary or supraclavicular adenopathy. No mediastinal or hilar adenopathy. No pericardial fluid. Esophagus normal. Lungs/Pleura: Again demonstrated RIGHT upper lobe 5.5 cm mass and RIGHT perihilar 5.6 cm mass. The perihilar mass is slightly decreased from 8.5 cm. Bilateral innumerable large pulmonary nodules not changed in the interval. No pneumothorax. Upper Abdomen: Limited view of the liver, kidneys, pancreas are unremarkable. Normal adrenal glands. Liver has a fine nodular contour Musculoskeletal: No aggressive osseous lesion. Review of the MIP images confirms the above findings. IMPRESSION: 1. Bilateral acute pulmonary emboli within the proximal pulmonary arteries. Overall clot burden is severe. No CT evidence of RIGHT ventricular strain. 2. RIGHT upper lobe and RIGHT perihilar masses consistent with primary bronchogenic carcinoma. 3. Innumerable bilateral pulmonary metastasis. Electronically Signed: By: Genevive Bi M.D. On: 09/04/2023 20:39   DG Chest 2 View  Result Date: 09/04/2023 CLINICAL DATA:  Shortness of breath and tachycardia. Concern for pneumonia. Right upper lobe mass. Non-small cell lung cancer. EXAM: CHEST - 2 VIEW COMPARISON:  Chest radiograph dated 08/14/2023. FINDINGS: Left subclavian catheter with tip at the cavoatrial junction. Persistent bilateral pulmonary opacities. No pleural effusion  or pneumothorax. The cardiac silhouette is within normal limits. No acute osseous pathology. IMPRESSION: Persistent bilateral pulmonary opacities. Electronically Signed   By: Elgie Collard M.D.   On: 09/04/2023 18:40   IR IMAGING GUIDED PORT INSERTION  Result Date: 09/01/2023 INDICATION: RIGHT lung cancer EXAM: IMPLANTED PORT A CATH PLACEMENT WITH ULTRASOUND AND FLUOROSCOPIC GUIDANCE MEDICATIONS: None; The antibiotic was administered within an appropriate time interval prior to skin puncture. ANESTHESIA/SEDATION: Moderate (conscious) sedation was employed during this procedure. A total of Versed 2 mg and Fentanyl 100 mcg was administered intravenously. Moderate Sedation Time: 26 minutes. The patient's level of consciousness and vital signs were monitored continuously by radiology nursing throughout the procedure under my direct supervision. FLUOROSCOPY TIME:  Fluoroscopic dose; 0 mGy COMPLICATIONS: None immediate. PROCEDURE: The procedure, risks, benefits, and alternatives were explained to the patient. Questions regarding the procedure were encouraged and answered. The patient understands and consents to the procedure. The LEFT neck and chest were prepped with chlorhexidine in a sterile fashion, and a sterile drape was applied covering the operative field. Maximum barrier sterile technique with sterile gowns and gloves were used for the procedure. A timeout was performed prior to the initiation of the  procedure. Local anesthesia was provided with 1% lidocaine with epinephrine. After creating a small venotomy incision, a micropuncture kit was utilized to access the internal jugular vein under direct, real-time ultrasound guidance. Ultrasound image documentation was performed. The microwire was kinked to measure appropriate catheter length. A subcutaneous port pocket was then created along the upper chest wall utilizing a combination of sharp and blunt dissection. The pocket was irrigated with sterile saline.  A single lumen power injectable port was chosen for placement. The 8 Fr catheter was tunneled from the port pocket site to the venotomy incision. The port was placed in the pocket. The external catheter was trimmed to appropriate length. At the venotomy, an 8 Fr peel-away sheath was placed over a guidewire under fluoroscopic guidance. The catheter was then placed through the sheath and the sheath was removed. Final catheter positioning was confirmed and documented with a fluoroscopic spot radiograph. The port was accessed with a Huber needle, aspirated and flushed with heparinized saline. The port pocket incision was closed with interrupted 3-0 Vicryl suture then Dermabond was applied, including at the venotomy incision. Dressings were placed. The patient tolerated the procedure well without immediate post procedural complication. IMPRESSION: Successful placement of a LEFT internal jugular approach power injectable Port-A-Cath. The tip of the catheter is positioned within the proximal RIGHT atrium. The catheter is ready for immediate use. Roanna Banning, MD [facility] Electronically Signed   By: Roanna Banning M.D.   On: 09/01/2023 11:44     PHYSICAL EXAM  Temp:  [97.6 F (36.4 C)-98.2 F (36.8 C)] 97.9 F (36.6 C) (11/24 0313) Pulse Rate:  [101-113] 101 (11/24 0313) Resp:  [18] 18 (11/24 0313) BP: (116-129)/(68-77) 129/77 (11/24 0313) SpO2:  [91 %-93 %] 91 % (11/24 0313)  General - cachectic, well developed, in no apparent distress.  Ophthalmologic - fundi not visualized due to noncooperation.  Cardiovascular - Regular rhythm and rate.  Neuro - awake, alert, eyes open, orientated to age, place, time and people. No aphasia, fluent language, following all simple commands. Able to name and repeat and read. No gaze palsy, tracking bilaterally, visual field full, PERRL. No facial droop. Tongue midline. Bilateral UEs 5/5, no drift. Bilaterally LEs  5/5, no drift. Sensation symmetrical bilaterally, b/l FTN intact, gait not tested.     ASSESSMENT/PLAN Ms. Alzenia Berson is a 62 y.o. 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  Put on Rocephin and azithromycin, symptom improved.  However, MRI showed new left occipital and left parietal microhemorrhages with surrounding edema.  Neurology consulted.   Cerebral micro bleeding - left parietal and left occipital CMBs, etiology unclear, CMB with surrounding edema due to eliquis use vs. Underlying metastasis, or late subacute infarct with petechial hemorrhagic conversion in the setting of hypercoagulable state MRI left parietal and left occipital micro bleeding surrounding edema and left occipital region CT head small area of hypodensity in the left occipital lobe with minimal hyperdensity correlation with a small focus of hemorrhage and associated edema noted on the prior MRI. CT repeat stable finding CT head and neck no LVO, up to moderate stenosis of nondominant right VA Will need to repeat MRI Brain with and without contrast in 3-4 weeks to follow up on the left occipital lesion/edema.  2D Echo EF 60 to 65% LDL 67 HgbA1c 5.8 Heparin subcu for VTE prophylaxis Eliquis (apixaban) daily prior  to admission, Eliquis was on hold.  Will resume eliquis 5mg  bid tonight. Ongoing aggressive stroke risk factor management Therapy recommendations: Pending Disposition: Pending  Lung cancer with metastasis PE and DVT due to hypercoagulable state 11/12 Chest CT right upper lobe and right suprahilar masses are unchanged.  Numerous bilateral pulmonary metastases are unchanged. 11/23 CT head and neck superimposed left palatine tonsillar enhancing mass, up to 2.3 cm. 11/04 chest CTA bilateral acute PE 11/05 LE venous Doppler acute DVT involving left popliteal vein, left posterior  tibial veins and left peroneal veins. On Eliquis PTA.  Eliquis was on hold, will resume Eliquis tonight On Decadron Further management per primary team.   Hypertension Stable Long term BP goal normotensive  Hyperlipidemia Home meds: None LDL 67, goal < 70 No statin needed as LDL within the goal  Other Stroke Risk Factors   Other Active Problems Anemia due to chronic disease, hemoglobin 6.8--PRBC--7.8--9.1--7.9  Hospital day # 6  Neurology will sign off. Please call with questions. Pt will follow up with stroke clinic NP at [facility] in about 2-4 weeks. Thanks for the consult.  Marvel Plan, MD PhD Stroke Neurology 09/24/2023 11:58 AM    To contact Stroke Continuity provider, please refer to [email or web address]. After hours, contact General Neurology

## 2023-09-25 ENCOUNTER — Telehealth: Payer: Self-pay | Admitting: Medical Oncology

## 2023-09-25 ENCOUNTER — Encounter: Payer: Self-pay | Admitting: Internal Medicine

## 2023-09-25 ENCOUNTER — Other Ambulatory Visit: Payer: Self-pay | Admitting: Physician Assistant

## 2023-09-25 DIAGNOSIS — Z86711 Personal history of pulmonary embolism: Secondary | ICD-10-CM | POA: Diagnosis present

## 2023-09-25 DIAGNOSIS — J358 Other chronic diseases of tonsils and adenoids: Secondary | ICD-10-CM | POA: Insufficient documentation

## 2023-09-25 DIAGNOSIS — I619 Nontraumatic intracerebral hemorrhage, unspecified: Secondary | ICD-10-CM | POA: Diagnosis present

## 2023-09-25 DIAGNOSIS — R6510 Systemic inflammatory response syndrome (SIRS) of non-infectious origin without acute organ dysfunction: Secondary | ICD-10-CM | POA: Diagnosis not present

## 2023-09-25 LAB — CBC WITH DIFFERENTIAL/PLATELET
Abs Immature Granulocytes: 0.06 10*3/uL (ref 0.00–0.07)
Basophils Absolute: 0 10*3/uL (ref 0.0–0.1)
Basophils Relative: 0 %
Eosinophils Absolute: 0 10*3/uL (ref 0.0–0.5)
Eosinophils Relative: 0 %
HCT: 26.1 % — ABNORMAL LOW (ref 36.0–46.0)
Hemoglobin: 7.6 g/dL — ABNORMAL LOW (ref 12.0–15.0)
Immature Granulocytes: 1 %
Lymphocytes Relative: 4 %
Lymphs Abs: 0.3 10*3/uL — ABNORMAL LOW (ref 0.7–4.0)
MCH: 26 pg (ref 26.0–34.0)
MCHC: 29.1 g/dL — ABNORMAL LOW (ref 30.0–36.0)
MCV: 89.4 fL (ref 80.0–100.0)
Monocytes Absolute: 0.4 10*3/uL (ref 0.1–1.0)
Monocytes Relative: 5 %
Neutro Abs: 6.9 10*3/uL (ref 1.7–7.7)
Neutrophils Relative %: 90 %
Platelets: 290 10*3/uL (ref 150–400)
RBC: 2.92 MIL/uL — ABNORMAL LOW (ref 3.87–5.11)
RDW: 15 % (ref 11.5–15.5)
WBC: 7.7 10*3/uL (ref 4.0–10.5)
nRBC: 0 % (ref 0.0–0.2)

## 2023-09-25 LAB — BASIC METABOLIC PANEL
Anion gap: 8 (ref 5–15)
BUN: 11 mg/dL (ref 8–23)
CO2: 24 mmol/L (ref 22–32)
Calcium: 8.5 mg/dL — ABNORMAL LOW (ref 8.9–10.3)
Chloride: 106 mmol/L (ref 98–111)
Creatinine, Ser: 0.61 mg/dL (ref 0.44–1.00)
GFR, Estimated: >60 mL/min (ref >60)
Glucose, Bld: 124 mg/dL — ABNORMAL HIGH (ref 70–99)
Potassium: 3.6 mmol/L (ref 3.5–5.1)
Sodium: 138 mmol/L (ref 135–145)

## 2023-09-25 MED ORDER — APIXABAN 5 MG PO TABS: 5.0000 mg | ORAL_TABLET | Freq: Two times a day (BID) | ORAL | 1 refills | Status: DC

## 2023-09-25 MED ORDER — ENSURE ENLIVE PO LIQD: 237.0000 mL | Freq: Two times a day (BID) | ORAL | 12 refills | Status: DC

## 2023-09-25 MED ORDER — DEXAMETHASONE 4 MG PO TABS: 4.0000 mg | ORAL_TABLET | Freq: Three times a day (TID) | ORAL | 0 refills | Status: DC

## 2023-09-25 MED ORDER — PROSOURCE PLUS PO LIQD: 30.0000 mL | Freq: Three times a day (TID) | ORAL | 1 refills | Status: DC

## 2023-09-25 MED ORDER — HEPARIN SOD (PORK) LOCK FLUSH 100 UNIT/ML IV SOLN
500.0000 [IU] | INTRAVENOUS | Status: AC | PRN
  Administered 2023-09-25: 500 [IU]

## 2023-09-25 NOTE — Discharge Summary (Addendum)
Physician Discharge Summary   Patient: Rebecca Ochoa MRN: [id number] DOB: May 15, 1961  Admit date:     09/18/2023  Discharge date: 09/25/23  Discharge Physician: Jonah Blue   PCP: [name], MD   Recommendations at discharge:   Continue Eliquis; if you are noticing new neurologic symptoms, please call Dr. Asa Lente office or your PCP immediately Appointment scheduled with [facility]NT tomorrow, 11/26 at 3pm with Dr. Ernestene Kiel Follow up with Dr. Arbutus Ped - his office will contact you with an appointment; resume care plan as previously laid out, per Dr. Arbutus Ped Will need follow up MRI in 3-4 weeks Follow up with Dr. Oneta Rack in 1-2 weeks Follow up with neurology in 1 month Continue Decadron 4 mg every 8 hours until Dr. 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 No source identified. Patient started empirically on Ceftriaxone and azithromycin and is improving. During hospitalization, patient developed persistent headache. 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 She is scheduled for repeat head CT on 11/23 with likely need for repeat contrasted MRI for further evaluation of possible metastasis.  CTA neck with tonsillar mass, will need ENT evaluation.  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 Arbutus Ped as an outpatient She has completed radiation and planned to start Tagrisso but has had delays MRI shows concern for brain mets and recommends f/u scan in 3 months  CTA shows tonsillar mass - Dr. Arbutus Ped recommends ENT consult and I called and spoke with Dr. Jenne Pane, who recommends outpatient referral for expedited work up Patient is concerned about this issue and requested inpatient consult with Dr. Jenne Pane but he assures me that she will be quickly followed up in the clinic; appointment scheduled for tomorrow, 11/26, at 3pm with Dr. Ernestene Kiel Patient also needs to see Dr. Arbutus Ped ASAP - he will be out of town until 12/4 and encourages her to proceed with the care plan as previously laid out   ICH Microbleeding/small focus of hemorrhage associated with eema Repeat CT and CTA reassuring Neurology consulting  Will need repeat MRI brain in 3-4 weeks Neurology started back the Eliquis on 11/24 Monitored overnight for 1 additional night to ensure no new neurologic issues after resuming Eliquis and she did well Continue Decadron until oncology f/u    Anemia of chronic disease Hgb down to 6.8 Transfused 1 unit PRBC with slow improvement Now back to baseline, stable at 7.6 today   History of pulmonary embolism PE diagnosed on 11/4 Patient has been on Eliquis for management Repeat CTA chest on 11/12 showed interval partial clearing. Held Eliquis in setting of possible acute brain hemorrhage Dr. Roda Shutters resumed Eliquis 11/24  Severe malnutrition Nutrition Problem:  Severe Malnutrition Etiology: chronic illness, cancer and cancer related treatments Signs/Symptoms: moderate fat depletion, mild muscle depletion, energy intake < or equal to 75% for > or  equal to 1 month, percent weight loss Interventions: Prostat, Education   Anxiety Continue prn alprazolam        Consultants:  Neurology TOC team Nutrition   Procedures:  None   Antimicrobials: Ceftriaxone  11/18-23 Azithromycin 11/18-22   30 Day Unplanned Readmission Risk Score    Flowsheet Row ED to Hosp-Admission (Current) from 09/18/2023 in [place] [facility]  30 Day Unplanned Readmission Risk Score (%) 35.66 Filed at 09/25/2023 0401       This score is the patient's risk of an unplanned readmission within 30 days of being discharged (0 -100%). The score is based on dignosis, age, lab data, medications, orders, and past utilization.   Low:  0-14.9   Medium: 15-21.9   High: 22-29.9   Extreme: 30 and above           Pain control -  Controlled Substance Reporting System database was reviewed. and patient was instructed, not to drive, operate heavy machinery, perform activities at heights, swimming or participation in water activities or provide baby-sitting services while on Pain, Sleep and Anxiety Medications; until their outpatient Physician has advised to do so again. Also recommended to not to take more than prescribed Pain, Sleep and Anxiety Medications.   Disposition: Home Diet recommendation:  Regular diet DISCHARGE MEDICATION: Allergies as of 09/25/2023       Reactions   Biaxin [clarithromycin] Nausea Only        Medication List     STOP taking these medications    cyanocobalamin 1000 MCG tablet   HYDROcodone bit-homatropine 5-1.5 MG/5ML syrup Commonly known as: HYCODAN   osimertinib mesylate 80 MG tablet Commonly known as: Tagrisso       TAKE these medications    (feeding supplement) PROSource Plus liquid Take 30 mLs by mouth 4 (four) times daily - after meals and at bedtime.   feeding supplement Liqd Take 237 mLs by mouth 2 (two) times daily between meals.   acetaminophen 500 MG tablet Commonly known  as: TYLENOL Take 1,000 mg by mouth every 8 (eight) hours as needed for mild pain.   albuterol 108 (90 Base) MCG/ACT inhaler Commonly known as: VENTOLIN HFA Inhale 2 puffs into the lungs every 6 (six) hours as needed for wheezing or shortness of breath.   ALPRAZolam 0.5 MG tablet Commonly known as: XANAX Take 1 tablet (0.5 mg total) by mouth See admin instructions. Take 0.5 mg by mouth at bedtime and an additional 0.5 mg once a day as needed for anxiety   apixaban 5 MG Tabs tablet Commonly known as: ELIQUIS Take 1 tablet (5 mg total) by mouth 2 (two) times daily. What changed: See the new instructions.   dexamethasone 4 MG tablet Commonly known as: DECADRON Take 1 tablet (4 mg total) by mouth every 8 (eight) hours.   folic acid 1 MG tablet Commonly known as: FOLVITE Take 1 tablet (1 mg total) by mouth daily.   HYDROcodone-acetaminophen 5-325 MG tablet Commonly known as: NORCO/VICODIN Take 1 tablet by mouth every 6 (six) hours as needed. What changed: reasons to take this   ondansetron 4 MG tablet Commonly known as: ZOFRAN Take 1 tablet (4 mg total) by mouth every 6 (six) hours as needed for nausea.   pantoprazole 40 MG tablet Commonly known as: PROTONIX Take 1 tablet (40 mg  total) by mouth 2 (two) times daily.   sucralfate 1 GM/10ML suspension Commonly known as: CARAFATE Take 10 mLs (1 g total) by mouth 4 (four) times daily -  with meals and at bedtime.        Follow-up Information     [facility]. Schedule an appointment as soon as possible for a visit in 1 month(s).   Specialty: Neurology Why: stroke clinic Contact information: [address] [place] [address] [phone]        Scarlette Ar, MD. Go in 1 day(s).   Specialty: Otolaryngology Why: Appointment at 3pm, show up 30 minutes prior Contact information: [address] [place] [address] [phone]                 Discharge Exam: Filed Weights   09/18/23 1519 09/18/23 2029 09/19/23 0857  Weight: 73.5 kg 73.6 kg 73.5 kg     Subjective: Feeling ok today, some back pain that she thinks is related to the bed.   Objective: Vitals:   09/25/23 0840 09/25/23 1122  BP: 126/70 137/79  Pulse: 95 84  Resp: 20 18  Temp: 97.8 F (36.6 C) 97.8 F (36.6 C)  SpO2: 94% 93%    Intake/Output Summary (Last 24 hours) at 09/25/2023 1338 Last data filed at 09/24/2023 1400 Gross per 24 hour  Intake 200 ml  Output --  Net 200 ml   Filed Weights   09/18/23 1519 09/18/23 2029 09/19/23 0857  Weight: 73.5 kg 73.6 kg 73.5 kg    Exam:  General:  Appears calm and comfortable and is in NAD Eyes:   EOMI, normal lids, iris ENT:  grossly normal hearing, lips & tongue, mmm Neck:  no LAD, masses or thyromegaly Cardiovascular:  RRR, no m/r/g. No LE edema.  Respiratory:   CTA bilaterally with no wheezes/rales/rhonchi.  Normal respiratory effort. Abdomen:  soft, NT, ND Skin:  no rash or induration seen on limited exam Musculoskeletal:  grossly normal tone BUE/BLE, good ROM, no bony abnormality Psychiatric:  blunted mood and affect, speech fluent and appropriate, AOx3 Neurologic:  CN 2-12 grossly intact, moves all extremities in coordinated fashion  Data Reviewed: I have reviewed the patient's lab results since admission.  Pertinent labs for today include:   Glucose 124 WBC 7.7 Hgb 7.6, 7.9 on 11/24    Condition at discharge: fair  The results of significant diagnostics from this hospitalization (including imaging, microbiology, ancillary and laboratory) are listed below for reference.   Imaging Studies: CT ANGIO HEAD NECK W WO CM  Result Date: 09/23/2023 CLINICAL DATA:  62 year old female with headache. Small areas of left occipital lobe and left parietal lobe hemorrhage and edema on the MRI yesterday. History of lung cancer. EXAM: CT ANGIOGRAPHY HEAD AND NECK WITH AND WITHOUT CONTRAST TECHNIQUE:  Multidetector CT imaging of the head and neck was performed using the standard protocol during bolus administration of intravenous contrast. Multiplanar CT image reconstructions and MIPs were obtained to evaluate the vascular anatomy. Carotid stenosis measurements (when applicable) are obtained utilizing NASCET criteria, using the distal internal carotid diameter as the denominator. RADIATION DOSE REDUCTION: This exam was performed according to the departmental dose-optimization program which includes automated exposure control, adjustment of the mA and/or kV according to patient size and/or use of iterative reconstruction technique. CONTRAST:  75mL OMNIPAQUE IOHEXOL 350 MG/ML SOLN COMPARISON:  Brain MRI and noncontrast head CT yesterday. FINDINGS: CT HEAD Brain: There remains no intracranial mass effect or ventriculomegaly.  Mild left occipital pole edema is stable (series 4, image 17), but the left parietal findings and the underlying microhemorrhage at both areas is not apparent by CT. No new intracranial abnormality. Calvarium and skull base: No acute or suspicious osseous lesion. Paranasal sinuses: Visualized paranasal sinuses and mastoids are stable and well aerated. Orbits: Leftward gaze, otherwise negative orbits and scalp. CTA NECK Skeleton: Age-appropriate cervical spine degeneration. No acute or suspicious osseous lesion. Upper chest: Upper lung mass is greater on the right. CT CTA chest 09/12/2023. Other neck: Heterogeneously enhancing left tonsillar pillar mass, 16 x 21 x 23 mm (AP by transverse by CC) at the level of the uvula. Left level 2 lymph nodes remain within normal limits. No parapharyngeal or retropharyngeal space inflammation. No other neck mass or lymphadenopathy. Aortic arch: Calcified aortic atherosclerosis.  3 vessel arch. Right carotid system: Mild for age atherosclerosis and no stenosis. Left carotid system: Left CCA origin plaque without stenosis. Mild soft plaque in the left CCA and at  the left ICA origin without significant stenosis. Vertebral arteries: Mild proximal right subclavian artery plaque without stenosis. Right vertebral artery origin calcified plaque with up to moderate stenosis series 13, image 71. Right vertebral remains patent to the skull base with no additional plaque or stenosis and is non dominant. Proximal left subclavian artery plaque without stenosis. Calcified plaque at the left vertebral artery origin but only mild stenosis on series 13, image 67. Dominant left vertebral. Mildly tortuous. No additional stenosis to the skull base. CTA HEAD Posterior circulation: Distal vertebral arteries and vertebrobasilar junction are patent without stenosis. Left V4 is dominant. Normal PICA origins. Patent basilar artery with calcified plaque on series 12, image 122. But no basilar stenosis. Patent SCA and left PCA origins. Fetal type right PCA origin. Bilateral PCA branches are within normal limits. Anterior circulation: Both ICA siphons are patent. Left siphon anterior genu calcified plaque with only mild stenosis. Right siphon cavernous segment calcified plaque with only mild stenosis. Normal right posterior communicating artery origin. Patent carotid termini. Normal MCA and ACA origins. Dominant right A1. Normal anterior communicating artery. Bilateral ACA branches are within normal limits. Bilateral MCA branches are within normal limits. Venous sinuses: Patent. Anatomic variants: Dominant left vertebral artery. Fetal type right PCA origin. Dominant right A1. Other findings: No abnormal brain enhancement is identified. Review of the MIP images confirms the above findings IMPRESSION: 1. Upper lung masses as detailed on 09/12/2023 chest CTA. Superimposed left palatine tonsillar enhancing mass, up to 2.3 cm. No malignant cervical lymphadenopathy, but the constellation raises the possibility of metastatic head and neck cancer or synchronous lung and tonsillar primaries. 2. Negative for  large vessel occlusion. 3. Up to moderate atherosclerotic stenosis of the non dominant Right Vertebral Artery. Dominant Left Vertebral Artery with no stenosis. 4. No other hemodynamically significant arterial stenosis. 5. Stable CT appearance of the brain since yesterday. No new intracranial abnormality. 6.  Aortic Atherosclerosis (ICD10-I70.0). Electronically Signed   By: Odessa Fleming M.D.   On: 09/23/2023 10:59   CT HEAD WO CONTRAST ( )  Result Date: 09/22/2023 CLINICAL DATA:  Seven and severe headache, metastatic disease suspected EXAM: CT HEAD WITHOUT CONTRAST TECHNIQUE: Contiguous axial images were obtained from the base of the skull through the vertex without intravenous contrast. RADIATION DOSE REDUCTION: This exam was performed according to the departmental dose-optimization program which includes automated exposure control, adjustment of the mA and/or kV according to patient size and/or use of iterative reconstruction technique. COMPARISON:  No prior  CT head available 09/22/2023 MRI head FINDINGS: Brain: Small area of hypodensity in the left occipital lobe, with minimal hyperdensity, which likely correlates to the small focus of hemorrhage with associated edema noted on the prior MRI. The smaller focus of edema in the left parietal lobe does not have as clear a correlate, but the hemorrhage may correlate with a small area of hyperdensity (series 6, image 33). No definite mass. No evidence of acute infarction, mass effect, or midline shift. No hydrocephalus. Vascular: No hyperdense vessel. Skull: Negative for fracture or focal lesion. Sinuses/Orbits: No acute finding. Other: The mastoid air cells are well aerated. IMPRESSION: 1. Small area of hypodensity in the left occipital lobe, with minimal hyperdensity, correlates with the small focus of hemorrhage and associated edema noted on the prior MRI. 2. The smaller focus of edema in the left parietal lobe does not have as clear a correlate on CT, but the  hemorrhage may correlate with a small area of hyperdensity. No definite mass. 3. Process. No additional acute intracranial Electronically Signed   By: Wiliam Ke M.D.   On: 09/22/2023 21:52   MR BRAIN W WO CONTRAST  Result Date: 09/22/2023 CLINICAL DATA:  Headache EXAM: MRI HEAD WITHOUT AND WITH CONTRAST TECHNIQUE: Multiplanar, multiecho pulse sequences of the brain and surrounding structures were obtained without and with intravenous contrast. CONTRAST:  7mL GADAVIST GADOBUTROL 1 MMOL/ML IV SOLN COMPARISON:  Brain MR 08/05/2023 FINDINGS: Brain: Negative for an acute infarct. No hemorrhage. No hydrocephalus. No extra-axial fluid collection. There is a background of mild chronic microvascular ischemic change. Compared to prior exam there is a new focus of susceptibility artifact in the left occipital lobe with surrounding cerebral edema. There is no contrast enhancement associated with this site of hemorrhage, but an underlying metastatic lesion is not excluded. Additional similar focus of susceptibility artifact in the left parietal lobe (series 10, image 53). Recommend follow up contrast-enhanced brain MRI in 3 months to assess for interval change. Vascular: Normal flow voids. Skull and upper cervical spine: Normal marrow signal. Sinuses/Orbits: Middle ear or mastoid effusion. Paranasal sinuses are clear. Orbits are unremarkable. Other: None. IMPRESSION: Compared to prior exam there are new small foci of hemorrhage in the left occipital lobe and left parietal lobe with surrounding cerebral edema. There is no contrast enhancement associated with sites. These are nonspecific, but metastatic disease is not excluded. Recommend follow up contrast-enhanced brain MRI in 3 months to assess for interval change. Electronically Signed   By: Lorenza Cambridge M.D.   On: 09/22/2023 17:19   US Abdomen Limited RUQ (LIVER/GB)  Result Date: 09/18/2023 CLINICAL DATA:  Elevated liver function tests. EXAM: ULTRASOUND ABDOMEN  LIMITED RIGHT UPPER QUADRANT COMPARISON:  None Available. FINDINGS: Gallbladder: The gallbladder is contracted. No gallstones or wall thickening visualized (1.9 mm). No sonographic Murphy sign noted by sonographer. Common bile duct: Diameter: 3.1 mm Liver: Heterogeneous echogenic masslike lesions are seen within the left lobe of the liver. The largest measures approximately 5.3 cm x 4.6 cm x 3.4 cm. Diffusely increased echogenicity of the liver parenchyma is noted. Portal vein is patent on color Doppler imaging with normal direction of blood flow towards the liver. Other: None. IMPRESSION: 1. Hepatic steatosis with multiple large heterogeneous liver lesions, concerning for the presence of hepatic metastasis. Electronically Signed   By: Aram Candela M.D.   On: 09/18/2023 21:53   DG Chest 2 View  Result Date: 09/18/2023 CLINICAL DATA:  Shortness of breath. Nonproductive cough. Suspected Sepsis EXAM: CHEST -  2 VIEW COMPARISON:  09/04/2023. FINDINGS: Redemonstration of multiple lobulated opacities throughout bilateral lungs, right more than left, compatible with patient's known history of pulmonary metastasis. No acute consolidation or lung collapse seen. Bilateral costophrenic angles are clear. Note is made of elevated right hemidiaphragm. Normal cardio-mediastinal silhouette. No acute osseous abnormalities. The soft tissues are within normal limits. Left-sided CT Port-A-Cath is again seen with its tip overlying the cavoatrial junction region. IMPRESSION: *No active cardiopulmonary disease. *Redemonstration of extensive lung opacities, compatible with metastases. Electronically Signed   By: Jules Schick M.D.   On: 09/18/2023 16:00   CT Angio Chest PE W/Cm &/Or Wo Cm  Result Date: 09/12/2023 CLINICAL DATA:  Bilateral pulmonary emboli diagnosis 09/04/2023. Bilateral pulmonary metastasis. RIGHT upper lobe pulmonary masses. * Tracking Code: BO * EXAM: CT ANGIOGRAPHY CHEST WITH CONTRAST TECHNIQUE:  Multidetector CT imaging of the chest was performed using the standard protocol during bolus administration of intravenous contrast. Multiplanar CT image reconstructions and MIPs were obtained to evaluate the vascular anatomy. RADIATION DOSE REDUCTION: This exam was performed according to the departmental dose-optimization program which includes automated exposure control, adjustment of the mA and/or kV according to patient size and/or use of iterative reconstruction technique. CONTRAST:  75mL OMNIPAQUE IOHEXOL 350 MG/ML SOLN COMPARISON:  CT 09/04/2023 FINDINGS: Cardiovascular: Interval partial clearing of the clot burden from the RIGHT lower lobe pulmonary artery thromboemboli. No significant clearing of the LEFT lower lobe pulmonary emboli. Interval clearing of the thrombus in the lingular pulmonary artery. No evidence of new pulmonary emboli. No RIGHT ventricular strain. Mediastinum/Nodes: RIGHT suprahilar mass encroaches upon the mediastinum. No supraclavicular adenopathy. No pericardial fluid. Lungs/Pleura: Numeral bilateral pulmonary metastasis not changed in short a day interval. Large RIGHT upper lobe pulmonary mass measuring 6. 5.5 cm is unchanged. RIGHT suprahilar mass measuring 5.4 cm is unchanged. Limited view of the liver, kidneys, pancreas are unremarkable. Normal adrenal glands. Upper Abdomen: Limited view of the liver, kidneys, pancreas are unremarkable. Normal adrenal glands. Musculoskeletal: No aggressive osseous lesion. Review of the MIP images confirms the above findings. IMPRESSION: 1. Interval partial clearing of the acute pulmonary thromboemboli diagnosed on 09/04/2023. 2. No evidence of new pulmonary emboli. No RIGHT ventricular strain. 3. RIGHT upper lobe and RIGHT suprahilar masses are unchanged. 4. Numerous bilateral pulmonary metastasis are unchanged. Electronically Signed   By: Genevive Bi M.D.   On: 09/12/2023 15:09   ECHOCARDIOGRAM COMPLETE  Result Date: 09/05/2023     ECHOCARDIOGRAM REPORT   Patient Name:   Aishwarya Gettel Date of Exam: 09/05/2023 Medical Rec #:  [id number]    Height:       65.0 in Accession #:    [id number]   Weight:       165.1 lb Date of Birth:  [birth year]     BSA:          1.823 m Patient Age:    62 years     BP:           142/73 mmHg Patient Gender: F            HR:           113 bpm. Exam Location:  Inpatient Procedure: 2D Echo, Cardiac Doppler and Color Doppler Indications:    pulmonary embolus  History:        Patient has no prior history of Echocardiogram examinations.                 COPD, Arrythmias:Tachycardia; Risk Factors:Hypertension and  Dyslipidemia.  Sonographer:    Karma Ganja Referring Phys: [id number] PROSPER M AMPONSAH  Sonographer Comments: Suboptimal parasternal window. Image acquisition challenging due to COPD. IMPRESSIONS  1. Left ventricular ejection fraction, by estimation, is 60 to 65%. The left ventricle has normal function. The left ventricle has no regional wall motion abnormalities. Indeterminate diastolic filling due to E-A fusion.  2. Right ventricular systolic function is normal. The right ventricular size is normal. Tricuspid regurgitation signal is inadequate for assessing PA pressure.  3. The mitral valve was not well visualized. No evidence of mitral valve regurgitation.  4. The aortic valve was not well visualized. Aortic valve regurgitation is not visualized. No aortic stenosis is present.  5. The inferior vena cava is normal in size with greater than 50% respiratory variability, suggesting right atrial pressure of 3 mmHg. FINDINGS  Left Ventricle: Left ventricular ejection fraction, by estimation, is 60 to 65%. The left ventricle has normal function. The left ventricle has no regional wall motion abnormalities. Definity contrast agent was given IV to delineate the left ventricular  endocardial borders. The left ventricular internal cavity size was normal in size. There is no left ventricular hypertrophy.  Indeterminate diastolic filling due to E-A fusion. Right Ventricle: The right ventricular size is normal. No increase in right ventricular wall thickness. Right ventricular systolic function is normal. Tricuspid regurgitation signal is inadequate for assessing PA pressure. Left Atrium: Left atrial size was not well visualized. Right Atrium: Right atrial size was not well visualized. Pericardium: There is no evidence of pericardial effusion. Presence of epicardial fat layer. Mitral Valve: The mitral valve was not well visualized. No evidence of mitral valve regurgitation. Tricuspid Valve: The tricuspid valve is grossly normal. Tricuspid valve regurgitation is not demonstrated. No evidence of tricuspid stenosis. Aortic Valve: The aortic valve was not well visualized. Aortic valve regurgitation is not visualized. No aortic stenosis is present. Aortic valve mean gradient measures 4.0 mmHg. Aortic valve peak gradient measures 8.3 mmHg. Aortic valve area, by VTI measures 1.77 cm. Pulmonic Valve: The pulmonic valve was not well visualized. Pulmonic valve regurgitation is not visualized. Aorta: The aortic root is normal in size and structure. Venous: The inferior vena cava is normal in size with greater than 50% respiratory variability, suggesting right atrial pressure of 3 mmHg. IAS/Shunts: The interatrial septum was not well visualized.  LEFT VENTRICLE PLAX 2D LVIDd:         4.50 cm     Diastology LVIDs:         3.70 cm     LV e' medial:    6.42 cm/s LV PW:         0.90 cm     LV E/e' medial:  9.9 LV IVS:        1.00 cm     LV e' lateral:   7.51 cm/s LVOT diam:     2.00 cm     LV E/e' lateral: 8.4 LV SV:         37 LV SV Index:   20 LVOT Area:     3.14 cm  LV Volumes (MOD) LV vol d, MOD A2C: 37.7 ml LV vol d, MOD A4C: 64.4 ml LV vol s, MOD A2C: 8.0 ml LV vol s, MOD A4C: 29.2 ml LV SV MOD A2C:     29.7 ml LV SV MOD A4C:     64.4 ml LV SV MOD BP:      33.5 ml RIGHT VENTRICLE  IVC RV Basal diam:  2.70 cm    IVC  diam: 1.50 cm RV S prime:     9.03 cm/s LEFT ATRIUM           Index        RIGHT ATRIUM           Index LA diam:      3.10 cm 1.70 cm/m   RA Area:     12.10 cm LA Vol (A2C): 24.8 ml 13.60 ml/m  RA Volume:   26.50 ml  14.53 ml/m LA Vol (A4C): 30.8 ml 16.89 ml/m  AORTIC VALVE AV Area (Vmax):    2.04 cm AV Area (Vmean):   1.83 cm AV Area (VTI):     1.77 cm AV Vmax:           144.00 cm/s AV Vmean:          95.000 cm/s AV VTI:            0.210 m AV Peak Grad:      8.3 mmHg AV Mean Grad:      4.0 mmHg LVOT Vmax:         93.40 cm/s LVOT Vmean:        55.300 cm/s LVOT VTI:          0.118 m LVOT/AV VTI ratio: 0.56  AORTA Ao Root diam: 3.10 cm MITRAL VALVE MV Area (PHT): 5.88 cm    SHUNTS MV Decel Time: 129 msec    Systemic VTI:  0.12 m MV E velocity: 63.40 cm/s  Systemic Diam: 2.00 cm MV A velocity: 86.10 cm/s MV E/A ratio:  0.74 Lennie Odor MD Electronically signed by Lennie Odor MD Signature Date/Time: 09/05/2023/1:37:27 PM    Final    VAS [place] LOWER EXTREMITY VENOUS (DVT)  Result Date: 09/05/2023  Lower Venous DVT Study Patient Name:  IONNA SESSOM  Date of Exam:   09/05/2023 Medical Rec #: [id number]     Accession #:    [id number] Date of Birth: August 30, 1961      Patient Gender: F Patient Age:   89 years Exam Location:  [facility] Procedure:      VAS [place] LOWER EXTREMITY VENOUS (DVT) Referring Phys: PRANAV PATEL --------------------------------------------------------------------------------  Indications: Edema, and pulmonary embolism.  Risk Factors: Cancer. Anticoagulation: Heparin. Comparison Study: No prior studies. Performing Technologist: Chanda Busing RVT  Examination Guidelines: A complete evaluation includes B-mode imaging, spectral Doppler, color Doppler, and power Doppler as needed of all accessible portions of each vessel. Bilateral testing is considered an integral part of a complete examination. Limited examinations for reoccurring indications may be performed as noted. The reflux portion of  the exam is performed with the patient in reverse Trendelenburg.  +---------+---------------+---------+-----------+----------+--------------+ RIGHT    CompressibilityPhasicitySpontaneityPropertiesThrombus Aging +---------+---------------+---------+-----------+----------+--------------+ CFV      Full           Yes      Yes                                 +---------+---------------+---------+-----------+----------+--------------+ SFJ      Full                                                        +---------+---------------+---------+-----------+----------+--------------+ FV Prox  Full                                                        +---------+---------------+---------+-----------+----------+--------------+  FV Mid   Full                                                        +---------+---------------+---------+-----------+----------+--------------+ FV DistalFull                                                        +---------+---------------+---------+-----------+----------+--------------+ PFV      Full                                                        +---------+---------------+---------+-----------+----------+--------------+ POP      Full           Yes      Yes                                 +---------+---------------+---------+-----------+----------+--------------+ PTV      Full                                                        +---------+---------------+---------+-----------+----------+--------------+ PERO     Full                                                        +---------+---------------+---------+-----------+----------+--------------+   +---------+---------------+---------+-----------+----------+--------------+ LEFT     CompressibilityPhasicitySpontaneityPropertiesThrombus Aging +---------+---------------+---------+-----------+----------+--------------+ CFV      Full           Yes      Yes                                  +---------+---------------+---------+-----------+----------+--------------+ SFJ      Full                                                        +---------+---------------+---------+-----------+----------+--------------+ FV Prox  Full                                                        +---------+---------------+---------+-----------+----------+--------------+ FV Mid   Full                                                        +---------+---------------+---------+-----------+----------+--------------+  FV DistalFull                                                        +---------+---------------+---------+-----------+----------+--------------+ PFV      Full                                                        +---------+---------------+---------+-----------+----------+--------------+ POP      Partial        No       No                   Acute          +---------+---------------+---------+-----------+----------+--------------+ PTV      None                                         Acute          +---------+---------------+---------+-----------+----------+--------------+ PERO     None                                         Acute          +---------+---------------+---------+-----------+----------+--------------+ Gastroc  Full                                                        +---------+---------------+---------+-----------+----------+--------------+     Summary: RIGHT: - There is no evidence of deep vein thrombosis in the lower extremity.  - No cystic structure found in the popliteal fossa.  LEFT: - Findings consistent with acute deep vein thrombosis involving the left popliteal vein, left posterior tibial veins, and left peroneal veins.  - No cystic structure found in the popliteal fossa.  *See table(s) above for measurements and observations. Electronically signed by Lemar Livings MD on 09/05/2023 at 1:01:31 PM.    Final     CT Angio Chest PE W and/or Wo Contrast  Addendum Date: 09/04/2023   ADDENDUM REPORT: 09/04/2023 20:51 ADDENDUM: Critical Value/emergent results were called by telephone at the time of interpretation on 09/04/2023 at 8:47 pm to provider MATTHEW TRIFAN , who verbally acknowledged these results. Electronically Signed   By: Genevive Bi M.D.   On: 09/04/2023 20:51   Result Date: 09/04/2023 CLINICAL DATA:  Bronchogenic carcinoma with pulmonary metastasis. Short of breath. Concern for pulmonary embolism. EXAM: CT ANGIOGRAPHY CHEST WITH CONTRAST TECHNIQUE: Multidetector CT imaging of the chest was performed using the standard protocol during bolus administration of intravenous contrast. Multiplanar CT image reconstructions and MIPs were obtained to evaluate the vascular anatomy. RADIATION DOSE REDUCTION: This exam was performed according to the departmental dose-optimization program which includes automated exposure control, adjustment of the mA and/or kV according to patient size and/or use of iterative reconstruction technique. CONTRAST:  80mL OMNIPAQUE IOHEXOL 350 MG/ML SOLN COMPARISON:  CT chest 08/14/2023  FINDINGS: Cardiovascular: Partially occlusive filling defects within the LEFT upper lobe pulmonary artery, lingula lower pulmonary artery and lower lobe pulmonary arteries. Large filling defect within the RIGHT lower lobe pulmonary arteries extending into the segmental branches of the pulmonary arteries. The RIGHT upper lobe pulmonary arteries constricted by a large suprahilar pulmonary mass. Overall clot burden is severe however there is no evidence of RIGHT ventricular strain (RIGHT ventricle to LEFT ventricle diameter less than 1) Mediastinum/Nodes: No axillary or supraclavicular adenopathy. No mediastinal or hilar adenopathy. No pericardial fluid. Esophagus normal. Lungs/Pleura: Again demonstrated RIGHT upper lobe 5.5 cm mass and RIGHT perihilar 5.6 cm mass. The perihilar mass is slightly decreased  from 8.5 cm. Bilateral innumerable large pulmonary nodules not changed in the interval. No pneumothorax. Upper Abdomen: Limited view of the liver, kidneys, pancreas are unremarkable. Normal adrenal glands. Liver has a fine nodular contour Musculoskeletal: No aggressive osseous lesion. Review of the MIP images confirms the above findings. IMPRESSION: 1. Bilateral acute pulmonary emboli within the proximal pulmonary arteries. Overall clot burden is severe. No CT evidence of RIGHT ventricular strain. 2. RIGHT upper lobe and RIGHT perihilar masses consistent with primary bronchogenic carcinoma. 3. Innumerable bilateral pulmonary metastasis. Electronically Signed: By: Genevive Bi M.D. On: 09/04/2023 20:39   DG Chest 2 View  Result Date: 09/04/2023 CLINICAL DATA:  Shortness of breath and tachycardia. Concern for pneumonia. Right upper lobe mass. Non-small cell lung cancer. EXAM: CHEST - 2 VIEW COMPARISON:  Chest radiograph dated 08/14/2023. FINDINGS: Left subclavian catheter with tip at the cavoatrial junction. Persistent bilateral pulmonary opacities. No pleural effusion or pneumothorax. The cardiac silhouette is within normal limits. No acute osseous pathology. IMPRESSION: Persistent bilateral pulmonary opacities. Electronically Signed   By: Elgie Collard M.D.   On: 09/04/2023 18:40   IR IMAGING GUIDED PORT INSERTION  Result Date: 09/01/2023 INDICATION: RIGHT lung cancer EXAM: IMPLANTED PORT A CATH PLACEMENT WITH ULTRASOUND AND FLUOROSCOPIC GUIDANCE MEDICATIONS: None; The antibiotic was administered within an appropriate time interval prior to skin puncture. ANESTHESIA/SEDATION: Moderate (conscious) sedation was employed during this procedure. A total of Versed 2 mg and Fentanyl 100 mcg was administered intravenously. Moderate Sedation Time: 26 minutes. The patient's level of consciousness and vital signs were monitored continuously by radiology nursing throughout the procedure under my direct  supervision. FLUOROSCOPY TIME:  Fluoroscopic dose; 0 mGy COMPLICATIONS: None immediate. PROCEDURE: The procedure, risks, benefits, and alternatives were explained to the patient. Questions regarding the procedure were encouraged and answered. The patient understands and consents to the procedure. The LEFT neck and chest were prepped with chlorhexidine in a sterile fashion, and a sterile drape was applied covering the operative field. Maximum barrier sterile technique with sterile gowns and gloves were used for the procedure. A timeout was performed prior to the initiation of the procedure. Local anesthesia was provided with 1% lidocaine with epinephrine. After creating a small venotomy incision, a micropuncture kit was utilized to access the internal jugular vein under direct, real-time ultrasound guidance. Ultrasound image documentation was performed. The microwire was kinked to measure appropriate catheter length. A subcutaneous port pocket was then created along the upper chest wall utilizing a combination of sharp and blunt dissection. The pocket was irrigated with sterile saline. A single lumen power injectable port was chosen for placement. The 8 Fr catheter was tunneled from the port pocket site to the venotomy incision. The port was placed in the pocket. The external catheter was trimmed to appropriate length. At the venotomy, an 8 Fr peel-away sheath  was placed over a guidewire under fluoroscopic guidance. The catheter was then placed through the sheath and the sheath was removed. Final catheter positioning was confirmed and documented with a fluoroscopic spot radiograph. The port was accessed with a Huber needle, aspirated and flushed with heparinized saline. The port pocket incision was closed with interrupted 3-0 Vicryl suture then Dermabond was applied, including at the venotomy incision. Dressings were placed. The patient tolerated the procedure well without immediate post procedural complication.  IMPRESSION: Successful placement of a LEFT internal jugular approach power injectable Port-A-Cath. The tip of the catheter is positioned within the proximal RIGHT atrium. The catheter is ready for immediate use. Roanna Banning, MD [facility] Radiology Electronically Signed   By: Roanna Banning M.D.   On: 09/01/2023 11:44    Microbiology: Results for orders placed or performed during the hospital encounter of 09/18/23  Culture, blood (Routine x 2)     Status: None   Collection Time: 09/18/23  1:26 PM   Specimen: BLOOD  Result Value Ref Range Status   Specimen Description   Final    BLOOD LEFT ANTECUBITAL Performed at [facility], [address] [place], [place] [address]    Special Requests   Final    BOTTLES DRAWN AEROBIC AND ANAEROBIC Blood Culture adequate volume Performed at [facility], [address] [place], [place] [address]    Culture   Final    NO GROWTH 5 DAYS Performed at [facility], [address], [place], [place] [address]    Report Status 09/23/2023 FINAL  Final  Culture, blood (Routine x 2)     Status: None   Collection Time: 09/18/23  2:24 PM   Specimen: BLOOD  Result Value Ref Range Status   Specimen Description   Final    BLOOD RIGHT ANTECUBITAL Performed at [facility], [address], [place], [place] [address]    Special Requests   Final    BOTTLES DRAWN AEROBIC AND ANAEROBIC Blood Culture adequate volume Performed at [facility], [address], [place], [place] [address]    Culture   Final    NO GROWTH 5 DAYS Performed at [facility], [address] [place], [place] [address]    Report Status 09/23/2023 FINAL  Final  Resp panel by RT-PCR (RSV, Flu A&B, Covid) Anterior Nasal Swab     Status: None   Collection Time: 09/18/23  4:36 PM   Specimen: Anterior Nasal Swab  Result Value Ref Range Status   SARS  Coronavirus 2 by RT PCR NEGATIVE NEGATIVE Final    Comment: (NOTE) SARS-CoV-2 target nucleic acids are NOT DETECTED.  The SARS-CoV-2 RNA is generally detectable in upper respiratory specimens during the acute phase of infection. The lowest concentration of SARS-CoV-2 viral copies this assay can detect is 138 copies/mL. A negative result does not preclude SARS-Cov-2 infection and should not be used as the sole basis for treatment or other patient management decisions. A negative result may occur with  improper specimen collection/handling, submission of specimen other than nasopharyngeal swab, presence of viral mutation(s) within the areas targeted by this assay, and inadequate number of viral copies(<138 copies/mL). A negative result must be combined with clinical observations, patient history, and epidemiological information. The expected result is Negative.  Fact Sheet for Patients:  BloggerCourse.com  Fact Sheet for Healthcare Providers:  SeriousBroker.it  This test is no t yet approved or cleared by  the [employer] and  has been authorized for detection and/or diagnosis of SARS-CoV-2 by FDA under an Emergency Use Authorization (EUA). This EUA will remain  in effect (meaning this test can be used) for the duration of the COVID-19 declaration under Section 564(b)(1) of the Act, 21 U.S.C.section 360bbb-3(b)(1), unless the authorization is terminated  or revoked sooner.       Influenza A by PCR NEGATIVE NEGATIVE Final   Influenza B by PCR NEGATIVE NEGATIVE Final    Comment: (NOTE) The Xpert Xpress SARS-CoV-2/FLU/RSV plus assay is intended as an aid in the diagnosis of influenza from Nasopharyngeal swab specimens and should not be used as a sole basis for treatment. Nasal washings and aspirates are unacceptable for Xpert Xpress SARS-CoV-2/FLU/RSV testing.  Fact Sheet for  Patients: BloggerCourse.com  Fact Sheet for Healthcare Providers: SeriousBroker.it  This test is not yet approved or cleared by the [place] FDA and has been authorized for detection and/or diagnosis of SARS-CoV-2 by FDA under an Emergency Use Authorization (EUA). This EUA will remain in effect (meaning this test can be used) for the duration of the COVID-19 declaration under Section 564(b)(1) of the Act, 21 U.S.C. section 360bbb-3(b)(1), unless the authorization is terminated or revoked.     Resp Syncytial Virus by PCR NEGATIVE NEGATIVE Final    Comment: (NOTE) Fact Sheet for Patients: BloggerCourse.com  Fact Sheet for Healthcare Providers: SeriousBroker.it  This test is not yet approved or cleared by the [place] FDA and has been authorized for detection and/or diagnosis of SARS-CoV-2 by FDA under an Emergency Use Authorization (EUA). This EUA will remain in effect (meaning this test can be used) for the duration of the COVID-19 declaration under Section 564(b)(1) of the Act, 21 U.S.C. section 360bbb-3(b)(1), unless the authorization is terminated or revoked.  Performed at [facility], [address], [place], [place] [address]   Respiratory (~20 pathogens) panel by PCR     Status: None   Collection Time: 09/20/23  8:21 AM   Specimen: Nasopharyngeal Swab; Respiratory  Result Value Ref Range Status   Adenovirus NOT DETECTED NOT DETECTED Final   Coronavirus 229E NOT DETECTED NOT DETECTED Final    Comment: (NOTE) The Coronavirus on the Respiratory Panel, DOES NOT test for the novel  Coronavirus (2019 nCoV)    Coronavirus HKU1 NOT DETECTED NOT DETECTED Final   Coronavirus NL63 NOT DETECTED NOT DETECTED Final   Coronavirus OC43 NOT DETECTED NOT DETECTED Final   Metapneumovirus NOT DETECTED NOT DETECTED Final   Rhinovirus / Enterovirus NOT  DETECTED NOT DETECTED Final   Influenza A NOT DETECTED NOT DETECTED Final   Influenza B NOT DETECTED NOT DETECTED Final   Parainfluenza Virus 1 NOT DETECTED NOT DETECTED Final   Parainfluenza Virus 2 NOT DETECTED NOT DETECTED Final   Parainfluenza Virus 3 NOT DETECTED NOT DETECTED Final   Parainfluenza Virus 4 NOT DETECTED NOT DETECTED Final   Respiratory Syncytial Virus NOT DETECTED NOT DETECTED Final   Bordetella pertussis NOT DETECTED NOT DETECTED Final   Bordetella Parapertussis NOT DETECTED NOT DETECTED Final   Chlamydophila pneumoniae NOT DETECTED NOT DETECTED Final   Mycoplasma pneumoniae NOT DETECTED NOT DETECTED Final    Comment: Performed at [facility], [address], [place], [place] [address]  C Difficile Quick Screen w PCR reflex     Status: None   Collection Time: 09/20/23  5:21 PM   Specimen: STOOL  Result Value Ref Range Status   C Diff antigen  NEGATIVE NEGATIVE Final   C Diff toxin NEGATIVE NEGATIVE Final   C Diff interpretation No C. difficile detected.  Final    Comment: Performed at [facility], [address], [place], [place] [address]  Gastrointestinal Panel by PCR , Stool     Status: None   Collection Time: 09/20/23  5:22 PM   Specimen: Stool  Result Value Ref Range Status   Campylobacter species NOT DETECTED NOT DETECTED Final   Plesimonas shigelloides NOT DETECTED NOT DETECTED Final   Salmonella species NOT DETECTED NOT DETECTED Final   Yersinia enterocolitica NOT DETECTED NOT DETECTED Final   Vibrio species NOT DETECTED NOT DETECTED Final   Vibrio cholerae NOT DETECTED NOT DETECTED Final   Enteroaggregative E coli (EAEC) NOT DETECTED NOT DETECTED Final   Enteropathogenic E coli (EPEC) NOT DETECTED NOT DETECTED Final   Enterotoxigenic E coli (ETEC) NOT DETECTED NOT DETECTED Final   Shiga like toxin producing E coli (STEC) NOT DETECTED NOT DETECTED Final   Shigella/Enteroinvasive E coli (EIEC) NOT DETECTED NOT DETECTED Final    Cryptosporidium NOT DETECTED NOT DETECTED Final   Cyclospora cayetanensis NOT DETECTED NOT DETECTED Final   Entamoeba histolytica NOT DETECTED NOT DETECTED Final   Giardia lamblia NOT DETECTED NOT DETECTED Final   Adenovirus F40/41 NOT DETECTED NOT DETECTED Final   Astrovirus NOT DETECTED NOT DETECTED Final   Norovirus GI/GII NOT DETECTED NOT DETECTED Final   Rotavirus A NOT DETECTED NOT DETECTED Final   Sapovirus (I, II, IV, and V) NOT DETECTED NOT DETECTED Final    Comment: Performed at [facility], [address] [place], [place] [address]    Labs: CBC: Recent Labs  Lab 09/20/23 0307 09/21/23 0444 09/22/23 0521 09/23/23 0640 09/24/23 0500 09/25/23 0550  WBC 2.5* 3.0* 3.3* 5.5 6.9 7.7  NEUTROABS 1.9  --   --   --  6.4 6.9  HGB 7.9* 7.7* 7.8* 9.1* 7.9* 7.6*  HCT 25.5* 24.8* 25.8* 30.2* 26.1* 26.1*  MCV 88.9 88.6 90.5 87.3 87.6 89.4  PLT 220 205 217 315 288 290   Basic Metabolic Panel: Recent Labs  Lab 09/19/23 0340 09/20/23 0307 09/21/23 0444 09/21/23 0700 09/24/23 0500 09/25/23 0550  NA 133* 135 137  --  139 138  K 3.2* 3.4* 3.4*  --  3.0* 3.6  CL 102 105 105  --  102 106  CO2 24 23 23   --  27 24  GLUCOSE 136* 108* 95  --  137* 124*  BUN 9 7* 6*  --  8 11  CREATININE 0.65 0.65 0.59  --  0.54 0.61  CALCIUM 8.0* 8.0* 8.2*  --  8.6* 8.5*  MG  --  2.0  --  2.0  --   --    Liver Function Tests: Recent Labs  Lab 09/21/23 0444  BILITOT 0.7   CBG: No results for input(s): "GLUCAP" in the last 168 hours.  Discharge time spent: greater than 30 minutes.  Signed: Jonah Blue, MD [facility] 09/25/2023

## 2023-09-25 NOTE — TOC Transition Note (Signed)
Transition of Care [facility]) - CM/SW Discharge Note   Patient Details  Name: Rebecca Ochoa MRN: [id number] Date of Birth: May 24, 1961  Transition of Care [facility]) CM/SW Contact:  Tom-Johnson, Hershal Coria, RN Phone Number: 09/25/2023, 2:02 PM   Clinical Narrative:     Patient is scheduled for discharge today.  Readmission Risk Assessment done. Outpatient f/u, hospital f/u and discharge instructions on AVS. No TOC needs or recommendations noted. Brother, Freida Busman to transport at discharge.  No further TOC needs noted.         Final next level of care: Home/Self Care Barriers to Discharge: Barriers Resolved   Patient Goals and CMS Choice CMS Medicare.gov Compare Post Acute Care list provided to:: Patient Choice offered to / list presented to : NA  Discharge Placement                  Patient to be transferred to facility by: Brother Name of family member notified: Freida Busman    Discharge Plan and Services Additional resources added to the After Visit Summary for                  DME Arranged: N/A DME Agency: NA       HH Arranged: NA HH Agency: NA        Social Determinants of Health (SDOH) Interventions SDOH Screenings   Food Insecurity: No Food Insecurity (09/18/2023)  Housing: Low Risk  (09/18/2023)  Transportation Needs: No Transportation Needs (09/18/2023)  Utilities: Not At Risk (09/18/2023)  Depression (PHQ2-9): Low Risk  (06/04/2022)  Tobacco Use: High Risk (09/19/2023)     Readmission Risk Interventions    09/25/2023    2:00 PM  Readmission Risk Prevention Plan  Transportation Screening Complete  Medication Review (RN Care Manager) Referral to Pharmacy  PCP or Specialist appointment within 3-5 days of discharge Complete  HRI or Home Care Consult Complete  SW Recovery Care/Counseling Consult Complete  Palliative Care Screening Not Applicable  Skilled Nursing Facility Not Applicable

## 2023-09-25 NOTE — Telephone Encounter (Signed)
Pt confirmed appt for 12/05 and infusion appt pending. I told pt to start her Tagrisso this week.

## 2023-09-25 NOTE — Telephone Encounter (Signed)
Oral Oncology Patient Advocate Encounter  Shipment has officially been scheduled for delivery to patient 09/27/23.   Patient has a $0 copay.  Jinger Neighbors, CPhT-Adv Oncology Pharmacy Patient Advocate [facility] Direct Number: [phone]  Fax: [phone]

## 2023-09-25 NOTE — Progress Notes (Signed)
Patient's chest port was de-accessed by the IV team nurse with a gauze and tape applied. Patient's portable cardiac monitor was returned to the docking station. Patient was given clarifying information that she has an ENT appointment for tomorrow (11/26) at 3pm, but that she should arrive at 2:30pm. We were also awaiting to see if Dr. Ophelia Charter could get more info as to if she would be able to see Dr. Arbutus Ped tomorrow morning before he leaves for out of town but did not yet have an update. The patient was encouraged to call Dr. Asa Lente office for an update. The patient was taken down in a wheelchair to her ride's vehicle by the nurse tech.

## 2023-09-26 ENCOUNTER — Telehealth: Payer: Self-pay

## 2023-09-26 ENCOUNTER — Other Ambulatory Visit: Payer: No Typology Code available for payment source

## 2023-09-26 ENCOUNTER — Other Ambulatory Visit: Payer: Self-pay

## 2023-09-26 ENCOUNTER — Telehealth: Payer: Self-pay | Admitting: Internal Medicine

## 2023-09-26 ENCOUNTER — Telehealth: Payer: Self-pay | Admitting: Medical Oncology

## 2023-09-26 NOTE — Telephone Encounter (Signed)
Faxed recent notes /scans from hospital visit .

## 2023-09-26 NOTE — Patient Outreach (Signed)
  Care Management  Transitions of Care Program Managed Medicaid Transitions of Care Week 1 re-enrollment into 30d program post hospitalization  09/26/2023 Name: Rebecca Ochoa MRN: [id number] DOB: [birth year]  Subjective: Rebecca Ochoa is a 62 y.o. year old female who is a primary care patient of [name], MD. The Care Management team was unable to reach the patient by phone to assess and address transitions of care needs.   Plan: Additional outreach attempts will be made to reach the patient enrolled in the [facility] Program (Post Inpatient/ED Visit).  Alyse Low, RN, BA, [facility], CRRN [facility] Coordinator, Transition of Care Ph # [phone]

## 2023-09-27 ENCOUNTER — Ambulatory Visit: Payer: No Typology Code available for payment source | Admitting: Nurse Practitioner

## 2023-09-27 ENCOUNTER — Ambulatory Visit: Payer: No Typology Code available for payment source

## 2023-09-27 ENCOUNTER — Other Ambulatory Visit: Payer: No Typology Code available for payment source

## 2023-09-29 ENCOUNTER — Ambulatory Visit: Payer: No Typology Code available for payment source

## 2023-10-02 ENCOUNTER — Encounter: Payer: Self-pay | Admitting: Internal Medicine

## 2023-10-03 ENCOUNTER — Other Ambulatory Visit: Payer: Self-pay

## 2023-10-03 ENCOUNTER — Telehealth: Payer: Self-pay

## 2023-10-03 NOTE — Patient Outreach (Signed)
  Care Management  Transitions of Care Program Managed Medicaid Transitions of Care re-enrollment/Follow-up program  10/03/2023 Name: [name] MRN: [id number] DOB: May 08, 1961  Subjective: Rebecca Ochoa is a 62 y.o. year old female who is a primary care patient of [name], MD. The Care Management team was unable to reach the patient by phone to assess and address transitions of care needs.   Plan: Additional outreach attempts will be made to reach the patient enrolled in the [facility] Program (Post Inpatient/ED Visit).  Alyse Low, RN, BA, [facility], CRRN [facility] Coordinator, Transition of Care Ph # [phone]

## 2023-10-04 ENCOUNTER — Ambulatory Visit: Payer: No Typology Code available for payment source

## 2023-10-04 ENCOUNTER — Telehealth: Payer: Self-pay

## 2023-10-04 ENCOUNTER — Ambulatory Visit: Payer: No Typology Code available for payment source | Admitting: Internal Medicine

## 2023-10-04 ENCOUNTER — Other Ambulatory Visit: Payer: No Typology Code available for payment source

## 2023-10-04 ENCOUNTER — Other Ambulatory Visit: Payer: Self-pay

## 2023-10-04 NOTE — Patient Outreach (Signed)
  Care Management  Transitions of Care Program Transitions of Care Post-discharge attempt to re-enroll into 30day program after re-hospitalization  10/04/2023 Name: Rebecca Ochoa MRN: [id number] DOB: [birth year]  Subjective: [name] is a 63 y.o. year old female who is a primary care patient of [name], MD. The Care Management team was unable to reach the patient by phone to assess and address transitions of care needs.   Plan: No further outreach attempts will be made at this time.  We have been unable to reach the patient.  Alyse Low, RN, BA, [facility], CRRN [facility] Coordinator, Transition of Care Ph # [phone]

## 2023-10-05 ENCOUNTER — Inpatient Hospital Stay (HOSPITAL_BASED_OUTPATIENT_CLINIC_OR_DEPARTMENT_OTHER): Payer: No Typology Code available for payment source | Admitting: Internal Medicine

## 2023-10-05 ENCOUNTER — Inpatient Hospital Stay: Payer: No Typology Code available for payment source | Attending: Internal Medicine

## 2023-10-05 VITALS — BP 114/77 | HR 95 | Temp 97.2°F | Resp 17 | Ht 65.0 in | Wt 152.5 lb

## 2023-10-05 DIAGNOSIS — C7951 Secondary malignant neoplasm of bone: Secondary | ICD-10-CM | POA: Insufficient documentation

## 2023-10-05 DIAGNOSIS — C787 Secondary malignant neoplasm of liver and intrahepatic bile duct: Secondary | ICD-10-CM | POA: Diagnosis present

## 2023-10-05 DIAGNOSIS — J358 Other chronic diseases of tonsils and adenoids: Secondary | ICD-10-CM | POA: Diagnosis not present

## 2023-10-05 DIAGNOSIS — R530 Neoplastic (malignant) related fatigue: Secondary | ICD-10-CM | POA: Insufficient documentation

## 2023-10-05 DIAGNOSIS — R197 Diarrhea, unspecified: Secondary | ICD-10-CM | POA: Insufficient documentation

## 2023-10-05 DIAGNOSIS — C3411 Malignant neoplasm of upper lobe, right bronchus or lung: Secondary | ICD-10-CM

## 2023-10-05 DIAGNOSIS — G4700 Insomnia, unspecified: Secondary | ICD-10-CM | POA: Diagnosis not present

## 2023-10-05 DIAGNOSIS — F17290 Nicotine dependence, other tobacco product, uncomplicated: Secondary | ICD-10-CM | POA: Diagnosis not present

## 2023-10-05 DIAGNOSIS — Z79899 Other long term (current) drug therapy: Secondary | ICD-10-CM | POA: Diagnosis not present

## 2023-10-05 DIAGNOSIS — Z7901 Long term (current) use of anticoagulants: Secondary | ICD-10-CM | POA: Insufficient documentation

## 2023-10-05 DIAGNOSIS — Z7952 Long term (current) use of systemic steroids: Secondary | ICD-10-CM | POA: Insufficient documentation

## 2023-10-05 LAB — CMP (CANCER CENTER ONLY)
ALT: 138 U/L — ABNORMAL HIGH (ref 0–44)
AST: 69 U/L — ABNORMAL HIGH (ref 15–41)
Albumin: 3.5 g/dL (ref 3.5–5.0)
Alkaline Phosphatase: 162 U/L — ABNORMAL HIGH (ref 38–126)
Anion gap: 6 (ref 5–15)
BUN: 19 mg/dL (ref 8–23)
CO2: 28 mmol/L (ref 22–32)
Calcium: 9.2 mg/dL (ref 8.9–10.3)
Chloride: 104 mmol/L (ref 98–111)
Creatinine: 0.68 mg/dL (ref 0.44–1.00)
GFR, Estimated: >60 mL/min (ref >60)
Glucose, Bld: 147 mg/dL — ABNORMAL HIGH (ref 70–99)
Potassium: 4.3 mmol/L (ref 3.5–5.1)
Sodium: 138 mmol/L (ref 135–145)
Total Bilirubin: 0.7 mg/dL (ref <1.2)
Total Protein: 6.4 g/dL — ABNORMAL LOW (ref 6.5–8.1)

## 2023-10-05 LAB — CBC WITH DIFFERENTIAL (CANCER CENTER ONLY)
Abs Immature Granulocytes: 0.09 10*3/uL — ABNORMAL HIGH (ref 0.00–0.07)
Basophils Absolute: 0 10*3/uL (ref 0.0–0.1)
Basophils Relative: 0 %
Eosinophils Absolute: 0.1 10*3/uL (ref 0.0–0.5)
Eosinophils Relative: 0 %
HCT: 39.5 % (ref 36.0–46.0)
Hemoglobin: 12.2 g/dL (ref 12.0–15.0)
Immature Granulocytes: 1 %
Lymphocytes Relative: 1 %
Lymphs Abs: 0.2 10*3/uL — ABNORMAL LOW (ref 0.7–4.0)
MCH: 27.4 pg (ref 26.0–34.0)
MCHC: 30.9 g/dL (ref 30.0–36.0)
MCV: 88.8 fL (ref 80.0–100.0)
Monocytes Absolute: 0.4 10*3/uL (ref 0.1–1.0)
Monocytes Relative: 3 %
Neutro Abs: 15.2 10*3/uL — ABNORMAL HIGH (ref 1.7–7.7)
Neutrophils Relative %: 95 %
Platelet Count: 146 10*3/uL — ABNORMAL LOW (ref 150–400)
RBC: 4.45 MIL/uL (ref 3.87–5.11)
RDW: 16.5 % — ABNORMAL HIGH (ref 11.5–15.5)
WBC Count: 16 10*3/uL — ABNORMAL HIGH (ref 4.0–10.5)
nRBC: 0 % (ref 0.0–0.2)

## 2023-10-05 MED ORDER — TEMAZEPAM 15 MG PO CAPS: 15.0000 mg | ORAL_CAPSULE | Freq: Every evening | ORAL | 0 refills | Status: DC | PRN

## 2023-10-05 MED FILL — Fosaprepitant Dimeglumine For IV Infusion 150 MG (Base Eq): INTRAVENOUS | Qty: 5 | Status: AC

## 2023-10-05 NOTE — Progress Notes (Signed)
[facility] [phone]   [phone]  OFFICE PROGRESS NOTE  [name], MD [address] [place] [address]  DIAGNOSIS: Stage IVB ( T3, N2, M1c) non-Small Cell Lung Cancer, poorly differentiated squamous cell carcinoma presented with large right upper lobe lung mass in addition to large necrotic right hilar/mediastinal mass and innumerable hypermetabolic pulmonary metastatic lesions in addition to liver and bone metastasis as well as abdominal celiac axis lymphadenopathy diagnosed in October 2024.    PDL1: 0%   Molecular Studies: Positive for EGFR G719A   PRIOR THERAPY: Palliative radiotherapy to the large hilar and mediastinal mass under the care of Dr. Roselind Messier last dose on 09/04/23   CURRENT THERAPY: Tagrisso 80 mg p.o. daily.  She will start concurrent systemic chemotherapy with carboplatin for AUC of 5 and Alimta 500 Mg/M2 on October 12, 2023  INTERVAL HISTORY: Maricia Mesmer 62 y.o. female returns to the clinic today for follow-up visit accompanied by her husband.Discussed the use of AI scribe software for clinical note transcription with the patient, who gave verbal consent to proceed.  History of Present Illness   The patient, a 62 year old with a history of EGFR mutation-positive lung cancer, diagnosed in October 2024. She has a minor EGFR mutation (G719A) and has received radiation therapy for mediastinal and hilar lymphadenopathy. Recently, she was hospitalized due to an elevated heart rate and low-grade fever. The cause of the fever was not identified, and she was treated with antibiotics. During the hospital stay, a small brain bleed and swelling were discovered, which were deemed non-active. The patient has been on steroids three times a day since then.  In addition to the lung cancer, a suspicious spot was found in the patient's tonsil, which could potentially be another cancer. This was discovered during the  investigation for the brain bleed. The tonsil area was also active on a PET scan, which showed disease in the lung, liver, and bone. The patient started on Tagrisso six days ago and has experienced some diarrhea, but no skin rash.  The patient reports feeling tired and has been struggling with sleep, getting only two to three hours of solid sleep per night. The patient has been on a steroid regimen three times a day for about two weeks, which will be tapered to twice a day for a week, then once a day for a week, and then half a tablet every day for a week before re-evaluation.       MEDICAL HISTORY: Past Medical History:  Diagnosis Date   Anemia    Asthma, mild intermittent, well-controlled    GERD (gastroesophageal reflux disease)    Hx of migraines    Hyperlipidemia    Hypertension    Vitamin D deficiency     ALLERGIES:  is allergic to biaxin [clarithromycin].  MEDICATIONS:  Current Outpatient Medications  Medication Sig Dispense Refill   acetaminophen (TYLENOL) 500 MG tablet Take 1,000 mg by mouth every 8 (eight) hours as needed for mild pain.     albuterol (VENTOLIN HFA) 108 (90 Base) MCG/ACT inhaler Inhale 2 puffs into the lungs every 6 (six) hours as needed for wheezing or shortness of breath. 8 g 2   ALPRAZolam (XANAX) 0.5 MG tablet Take 1 tablet (0.5 mg total) by mouth See admin instructions. Take 0.5 mg by mouth at bedtime and an additional 0.5 mg once a day as needed for anxiety 60 tablet 0   apixaban (ELIQUIS) 5 MG TABS tablet  Take 1 tablet (5 mg total) by mouth 2 (two) times daily. 60 tablet 1   dexamethasone (DECADRON) 4 MG tablet Take 1 tablet (4 mg total) by mouth every 8 (eight) hours. 120 tablet 0   feeding supplement (ENSURE ENLIVE / ENSURE PLUS) LIQD Take 237 mLs by mouth 2 (two) times daily between meals. 237 mL 12   folic acid (FOLVITE) 1 MG tablet Take 1 tablet (1 mg total) by mouth daily. 30 tablet 2   HYDROcodone-acetaminophen (NORCO/VICODIN) 5-325 MG tablet Take  1 tablet by mouth every 6 (six) hours as needed. (Patient taking differently: Take 1 tablet by mouth every 6 (six) hours as needed for moderate pain (pain score 4-6).) 21 tablet 0   Nutritional Supplements (,FEEDING SUPPLEMENT, PROSOURCE PLUS) liquid Take 30 mLs by mouth 4 (four) times daily - after meals and at bedtime. 887 mL 1   ondansetron (ZOFRAN) 4 MG tablet Take 1 tablet (4 mg total) by mouth every 6 (six) hours as needed for nausea. 20 tablet 0   pantoprazole (PROTONIX) 40 MG tablet Take 1 tablet (40 mg total) by mouth 2 (two) times daily. 30 tablet 2   sucralfate (CARAFATE) 1 GM/10ML suspension Take 10 mLs (1 g total) by mouth 4 (four) times daily -  with meals and at bedtime. 420 mL 0   No current facility-administered medications for this visit.    SURGICAL HISTORY:  Past Surgical History:  Procedure Laterality Date   BREAST SURGERY     reduction   BRONCHIAL BIOPSY  08/08/2023   Procedure: BRONCHIAL BIOPSIES;  Surgeon: Leslye Peer, MD;  Location: [facility] ENDOSCOPY;  Service: Pulmonary;;   BRONCHIAL BRUSHINGS  08/08/2023   Procedure: BRONCHIAL BRUSHINGS;  Surgeon: Leslye Peer, MD;  Location: [facility] ENDOSCOPY;  Service: Pulmonary;;   BRONCHIAL NEEDLE ASPIRATION BIOPSY  08/08/2023   Procedure: BRONCHIAL NEEDLE ASPIRATION BIOPSIES;  Surgeon: Leslye Peer, MD;  Location: [facility] ENDOSCOPY;  Service: Pulmonary;;   ENDOBRONCHIAL ULTRASOUND Bilateral 08/08/2023   Procedure: ENDOBRONCHIAL ULTRASOUND;  Surgeon: Leslye Peer, MD;  Location: [facility] ENDOSCOPY;  Service: Pulmonary;  Laterality: Bilateral;   HEMOSTASIS CONTROL  08/08/2023   Procedure: HEMOSTASIS CONTROL;  Surgeon: Leslye Peer, MD;  Location: [facility] ENDOSCOPY;  Service: Pulmonary;;   IR IMAGING GUIDED PORT INSERTION  09/01/2023   REDUCTION MAMMAPLASTY     TUBAL LIGATION      REVIEW OF SYSTEMS:  Constitutional: positive for anorexia, fatigue, and weight loss Eyes: negative Ears, nose, mouth, throat, and face: negative Respiratory:  positive for cough and dyspnea on exertion Cardiovascular: negative Gastrointestinal: negative Genitourinary:negative Integument/breast: negative Hematologic/lymphatic: negative Musculoskeletal:negative Neurological: negative Behavioral/Psych: negative Endocrine: negative Allergic/Immunologic: negative   PHYSICAL EXAMINATION: General appearance: alert, cooperative, fatigued, and no distress Head: Normocephalic, without obvious abnormality, atraumatic Neck: no adenopathy, no JVD, supple, symmetrical, trachea midline, and thyroid not enlarged, symmetric, no tenderness/mass/nodules Lymph nodes: Cervical, supraclavicular, and axillary nodes normal. Resp: clear to auscultation bilaterally Back: symmetric, no curvature. ROM normal. No CVA tenderness. Cardio: regular rate and rhythm, S1, S2 normal, no murmur, click, rub or gallop GI: soft, non-tender; bowel sounds normal; no masses,  no organomegaly Extremities: extremities normal, atraumatic, no cyanosis or edema Neurologic: Alert and oriented X 3, normal strength and tone. Normal symmetric reflexes. Normal coordination and gait  ECOG PERFORMANCE STATUS: 1 - Symptomatic but completely ambulatory  Blood pressure 114/77, pulse 95, temperature (!) 97.2 F (36.2 C), temperature source Temporal, resp. rate 17, height 5\' 5"  (1.651 m), weight 152 lb 8  oz (69.2 kg), SpO2 98%.  LABORATORY DATA: Lab Results  Component Value Date   WBC 16.0 (H) 10/05/2023   HGB 12.2 10/05/2023   HCT 39.5 10/05/2023   MCV 88.8 10/05/2023   PLT 146 (L) 10/05/2023      Chemistry      Component Value Date/Time   NA 138 10/05/2023 0900   K 4.3 10/05/2023 0900   CL 104 10/05/2023 0900   CO2 28 10/05/2023 0900   BUN 19 10/05/2023 0900   CREATININE 0.68 10/05/2023 0900   CREATININE 1.05 10/12/2022 1442      Component Value Date/Time   CALCIUM 9.2 10/05/2023 0900   ALKPHOS 162 (H) 10/05/2023 0900   AST 69 (H) 10/05/2023 0900   ALT 138 (H) 10/05/2023 0900    BILITOT 0.7 10/05/2023 0900       RADIOGRAPHIC STUDIES: CT ANGIO HEAD NECK W WO CM  Result Date: 09/23/2023 CLINICAL DATA:  62 year old female with headache. Small areas of left occipital lobe and left parietal lobe hemorrhage and edema on the MRI yesterday. History of lung cancer. EXAM: CT ANGIOGRAPHY HEAD AND NECK WITH AND WITHOUT CONTRAST TECHNIQUE: Multidetector CT imaging of the head and neck was performed using the standard protocol during bolus administration of intravenous contrast. Multiplanar CT image reconstructions and MIPs were obtained to evaluate the vascular anatomy. Carotid stenosis measurements (when applicable) are obtained utilizing NASCET criteria, using the distal internal carotid diameter as the denominator. RADIATION DOSE REDUCTION: This exam was performed according to the departmental dose-optimization program which includes automated exposure control, adjustment of the mA and/or kV according to patient size and/or use of iterative reconstruction technique. CONTRAST:  75mL OMNIPAQUE IOHEXOL 350 MG/ML SOLN COMPARISON:  Brain MRI and noncontrast head CT yesterday. FINDINGS: CT HEAD Brain: There remains no intracranial mass effect or ventriculomegaly. Mild left occipital pole edema is stable (series 4, image 17), but the left parietal findings and the underlying microhemorrhage at both areas is not apparent by CT. No new intracranial abnormality. Calvarium and skull base: No acute or suspicious osseous lesion. Paranasal sinuses: Visualized paranasal sinuses and mastoids are stable and well aerated. Orbits: Leftward gaze, otherwise negative orbits and scalp. CTA NECK Skeleton: Age-appropriate cervical spine degeneration. No acute or suspicious osseous lesion. Upper chest: Upper lung mass is greater on the right. CT CTA chest 09/12/2023. Other neck: Heterogeneously enhancing left tonsillar pillar mass, 16 x 21 x 23 mm (AP by transverse by CC) at the level of the uvula. Left level 2  lymph nodes remain within normal limits. No parapharyngeal or retropharyngeal space inflammation. No other neck mass or lymphadenopathy. Aortic arch: Calcified aortic atherosclerosis.  3 vessel arch. Right carotid system: Mild for age atherosclerosis and no stenosis. Left carotid system: Left CCA origin plaque without stenosis. Mild soft plaque in the left CCA and at the left ICA origin without significant stenosis. Vertebral arteries: Mild proximal right subclavian artery plaque without stenosis. Right vertebral artery origin calcified plaque with up to moderate stenosis series 13, image 71. Right vertebral remains patent to the skull base with no additional plaque or stenosis and is non dominant. Proximal left subclavian artery plaque without stenosis. Calcified plaque at the left vertebral artery origin but only mild stenosis on series 13, image 67. Dominant left vertebral. Mildly tortuous. No additional stenosis to the skull base. CTA HEAD Posterior circulation: Distal vertebral arteries and vertebrobasilar junction are patent without stenosis. Left V4 is dominant. Normal PICA origins. Patent basilar artery with calcified plaque on series  12, image 122. But no basilar stenosis. Patent SCA and left PCA origins. Fetal type right PCA origin. Bilateral PCA branches are within normal limits. Anterior circulation: Both ICA siphons are patent. Left siphon anterior genu calcified plaque with only mild stenosis. Right siphon cavernous segment calcified plaque with only mild stenosis. Normal right posterior communicating artery origin. Patent carotid termini. Normal MCA and ACA origins. Dominant right A1. Normal anterior communicating artery. Bilateral ACA branches are within normal limits. Bilateral MCA branches are within normal limits. Venous sinuses: Patent. Anatomic variants: Dominant left vertebral artery. Fetal type right PCA origin. Dominant right A1. Other findings: No abnormal brain enhancement is identified.  Review of the MIP images confirms the above findings IMPRESSION: 1. Upper lung masses as detailed on 09/12/2023 chest CTA. Superimposed left palatine tonsillar enhancing mass, up to 2.3 cm. No malignant cervical lymphadenopathy, but the constellation raises the possibility of metastatic head and neck cancer or synchronous lung and tonsillar primaries. 2. Negative for large vessel occlusion. 3. Up to moderate atherosclerotic stenosis of the non dominant Right Vertebral Artery. Dominant Left Vertebral Artery with no stenosis. 4. No other hemodynamically significant arterial stenosis. 5. Stable CT appearance of the brain since yesterday. No new intracranial abnormality. 6.  Aortic Atherosclerosis (ICD10-I70.0). Electronically Signed   By: Odessa Fleming M.D.   On: 09/23/2023 10:59   CT HEAD WO CONTRAST ( )  Result Date: 09/22/2023 CLINICAL DATA:  Seven and severe headache, metastatic disease suspected EXAM: CT HEAD WITHOUT CONTRAST TECHNIQUE: Contiguous axial images were obtained from the base of the skull through the vertex without intravenous contrast. RADIATION DOSE REDUCTION: This exam was performed according to the departmental dose-optimization program which includes automated exposure control, adjustment of the mA and/or kV according to patient size and/or use of iterative reconstruction technique. COMPARISON:  No prior CT head available 09/22/2023 MRI head FINDINGS: Brain: Small area of hypodensity in the left occipital lobe, with minimal hyperdensity, which likely correlates to the small focus of hemorrhage with associated edema noted on the prior MRI. The smaller focus of edema in the left parietal lobe does not have as clear a correlate, but the hemorrhage may correlate with a small area of hyperdensity (series 6, image 33). No definite mass. No evidence of acute infarction, mass effect, or midline shift. No hydrocephalus. Vascular: No hyperdense vessel. Skull: Negative for fracture or focal lesion.  Sinuses/Orbits: No acute finding. Other: The mastoid air cells are well aerated. IMPRESSION: 1. Small area of hypodensity in the left occipital lobe, with minimal hyperdensity, correlates with the small focus of hemorrhage and associated edema noted on the prior MRI. 2. The smaller focus of edema in the left parietal lobe does not have as clear a correlate on CT, but the hemorrhage may correlate with a small area of hyperdensity. No definite mass. 3. Process. No additional acute intracranial Electronically Signed   By: Wiliam Ke M.D.   On: 09/22/2023 21:52   MR BRAIN W WO CONTRAST  Result Date: 09/22/2023 CLINICAL DATA:  Headache EXAM: MRI HEAD WITHOUT AND WITH CONTRAST TECHNIQUE: Multiplanar, multiecho pulse sequences of the brain and surrounding structures were obtained without and with intravenous contrast. CONTRAST:  7mL GADAVIST GADOBUTROL 1 MMOL/ML IV SOLN COMPARISON:  Brain MR 08/05/2023 FINDINGS: Brain: Negative for an acute infarct. No hemorrhage. No hydrocephalus. No extra-axial fluid collection. There is a background of mild chronic microvascular ischemic change. Compared to prior exam there is a new focus of susceptibility artifact in the left occipital lobe with  surrounding cerebral edema. There is no contrast enhancement associated with this site of hemorrhage, but an underlying metastatic lesion is not excluded. Additional similar focus of susceptibility artifact in the left parietal lobe (series 10, image 53). Recommend follow up contrast-enhanced brain MRI in 3 months to assess for interval change. Vascular: Normal flow voids. Skull and upper cervical spine: Normal marrow signal. Sinuses/Orbits: Middle ear or mastoid effusion. Paranasal sinuses are clear. Orbits are unremarkable. Other: None. IMPRESSION: Compared to prior exam there are new small foci of hemorrhage in the left occipital lobe and left parietal lobe with surrounding cerebral edema. There is no contrast enhancement associated  with sites. These are nonspecific, but metastatic disease is not excluded. Recommend follow up contrast-enhanced brain MRI in 3 months to assess for interval change. Electronically Signed   By: Lorenza Cambridge M.D.   On: 09/22/2023 17:19   US Abdomen Limited RUQ (LIVER/GB)  Result Date: 09/18/2023 CLINICAL DATA:  Elevated liver function tests. EXAM: ULTRASOUND ABDOMEN LIMITED RIGHT UPPER QUADRANT COMPARISON:  None Available. FINDINGS: Gallbladder: The gallbladder is contracted. No gallstones or wall thickening visualized (1.9 mm). No sonographic Murphy sign noted by sonographer. Common bile duct: Diameter: 3.1 mm Liver: Heterogeneous echogenic masslike lesions are seen within the left lobe of the liver. The largest measures approximately 5.3 cm x 4.6 cm x 3.4 cm. Diffusely increased echogenicity of the liver parenchyma is noted. Portal vein is patent on color Doppler imaging with normal direction of blood flow towards the liver. Other: None. IMPRESSION: 1. Hepatic steatosis with multiple large heterogeneous liver lesions, concerning for the presence of hepatic metastasis. Electronically Signed   By: Aram Candela M.D.   On: 09/18/2023 21:53   DG Chest 2 View  Result Date: 09/18/2023 CLINICAL DATA:  Shortness of breath. Nonproductive cough. Suspected Sepsis EXAM: CHEST - 2 VIEW COMPARISON:  09/04/2023. FINDINGS: Redemonstration of multiple lobulated opacities throughout bilateral lungs, right more than left, compatible with patient's known history of pulmonary metastasis. No acute consolidation or lung collapse seen. Bilateral costophrenic angles are clear. Note is made of elevated right hemidiaphragm. Normal cardio-mediastinal silhouette. No acute osseous abnormalities. The soft tissues are within normal limits. Left-sided CT Port-A-Cath is again seen with its tip overlying the cavoatrial junction region. IMPRESSION: *No active cardiopulmonary disease. *Redemonstration of extensive lung opacities,  compatible with metastases. Electronically Signed   By: Jules Schick M.D.   On: 09/18/2023 16:00   CT Angio Chest PE W/Cm &/Or Wo Cm  Result Date: 09/12/2023 CLINICAL DATA:  Bilateral pulmonary emboli diagnosis 09/04/2023. Bilateral pulmonary metastasis. RIGHT upper lobe pulmonary masses. * Tracking Code: BO * EXAM: CT ANGIOGRAPHY CHEST WITH CONTRAST TECHNIQUE: Multidetector CT imaging of the chest was performed using the standard protocol during bolus administration of intravenous contrast. Multiplanar CT image reconstructions and MIPs were obtained to evaluate the vascular anatomy. RADIATION DOSE REDUCTION: This exam was performed according to the departmental dose-optimization program which includes automated exposure control, adjustment of the mA and/or kV according to patient size and/or use of iterative reconstruction technique. CONTRAST:  75mL OMNIPAQUE IOHEXOL 350 MG/ML SOLN COMPARISON:  CT 09/04/2023 FINDINGS: Cardiovascular: Interval partial clearing of the clot burden from the RIGHT lower lobe pulmonary artery thromboemboli. No significant clearing of the LEFT lower lobe pulmonary emboli. Interval clearing of the thrombus in the lingular pulmonary artery. No evidence of new pulmonary emboli. No RIGHT ventricular strain. Mediastinum/Nodes: RIGHT suprahilar mass encroaches upon the mediastinum. No supraclavicular adenopathy. No pericardial fluid. Lungs/Pleura: Numeral bilateral pulmonary metastasis not  changed in short a day interval. Large RIGHT upper lobe pulmonary mass measuring 6. 5.5 cm is unchanged. RIGHT suprahilar mass measuring 5.4 cm is unchanged. Limited view of the liver, kidneys, pancreas are unremarkable. Normal adrenal glands. Upper Abdomen: Limited view of the liver, kidneys, pancreas are unremarkable. Normal adrenal glands. Musculoskeletal: No aggressive osseous lesion. Review of the MIP images confirms the above findings. IMPRESSION: 1. Interval partial clearing of the acute  pulmonary thromboemboli diagnosed on 09/04/2023. 2. No evidence of new pulmonary emboli. No RIGHT ventricular strain. 3. RIGHT upper lobe and RIGHT suprahilar masses are unchanged. 4. Numerous bilateral pulmonary metastasis are unchanged. Electronically Signed   By: Genevive Bi M.D.   On: 09/12/2023 15:09   ECHOCARDIOGRAM COMPLETE  Result Date: 09/05/2023    ECHOCARDIOGRAM REPORT   Patient Name:   Jamekia Foor Date of Exam: 09/05/2023 Medical Rec #:  [id number]    Height:       65.0 in Accession #:    [id number]   Weight:       165.1 lb Date of Birth:  Jul 30, 1961     BSA:          1.823 m Patient Age:    62 years     BP:           142/73 mmHg Patient Gender: F            HR:           113 bpm. Exam Location:  Inpatient Procedure: 2D Echo, Cardiac Doppler and Color Doppler Indications:    pulmonary embolus  History:        Patient has no prior history of Echocardiogram examinations.                 COPD, Arrythmias:Tachycardia; Risk Factors:Hypertension and                 Dyslipidemia.  Sonographer:    Karma Ganja Referring Phys: [id number] PROSPER M AMPONSAH  Sonographer Comments: Suboptimal parasternal window. Image acquisition challenging due to COPD. IMPRESSIONS  1. Left ventricular ejection fraction, by estimation, is 60 to 65%. The left ventricle has normal function. The left ventricle has no regional wall motion abnormalities. Indeterminate diastolic filling due to E-A fusion.  2. Right ventricular systolic function is normal. The right ventricular size is normal. Tricuspid regurgitation signal is inadequate for assessing PA pressure.  3. The mitral valve was not well visualized. No evidence of mitral valve regurgitation.  4. The aortic valve was not well visualized. Aortic valve regurgitation is not visualized. No aortic stenosis is present.  5. The inferior vena cava is normal in size with greater than 50% respiratory variability, suggesting right atrial pressure of 3 mmHg. FINDINGS  Left Ventricle: Left  ventricular ejection fraction, by estimation, is 60 to 65%. The left ventricle has normal function. The left ventricle has no regional wall motion abnormalities. Definity contrast agent was given IV to delineate the left ventricular  endocardial borders. The left ventricular internal cavity size was normal in size. There is no left ventricular hypertrophy. Indeterminate diastolic filling due to E-A fusion. Right Ventricle: The right ventricular size is normal. No increase in right ventricular wall thickness. Right ventricular systolic function is normal. Tricuspid regurgitation signal is inadequate for assessing PA pressure. Left Atrium: Left atrial size was not well visualized. Right Atrium: Right atrial size was not well visualized. Pericardium: There is no evidence of pericardial effusion. Presence of epicardial fat layer. Mitral Valve: The mitral valve  was not well visualized. No evidence of mitral valve regurgitation. Tricuspid Valve: The tricuspid valve is grossly normal. Tricuspid valve regurgitation is not demonstrated. No evidence of tricuspid stenosis. Aortic Valve: The aortic valve was not well visualized. Aortic valve regurgitation is not visualized. No aortic stenosis is present. Aortic valve mean gradient measures 4.0 mmHg. Aortic valve peak gradient measures 8.3 mmHg. Aortic valve area, by VTI measures 1.77 cm. Pulmonic Valve: The pulmonic valve was not well visualized. Pulmonic valve regurgitation is not visualized. Aorta: The aortic root is normal in size and structure. Venous: The inferior vena cava is normal in size with greater than 50% respiratory variability, suggesting right atrial pressure of 3 mmHg. IAS/Shunts: The interatrial septum was not well visualized.  LEFT VENTRICLE PLAX 2D LVIDd:         4.50 cm     Diastology LVIDs:         3.70 cm     LV e' medial:    6.42 cm/s LV PW:         0.90 cm     LV E/e' medial:  9.9 LV IVS:        1.00 cm     LV e' lateral:   7.51 cm/s LVOT diam:      2.00 cm     LV E/e' lateral: 8.4 LV SV:         37 LV SV Index:   20 LVOT Area:     3.14 cm  LV Volumes (MOD) LV vol d, MOD A2C: 37.7 ml LV vol d, MOD A4C: 64.4 ml LV vol s, MOD A2C: 8.0 ml LV vol s, MOD A4C: 29.2 ml LV SV MOD A2C:     29.7 ml LV SV MOD A4C:     64.4 ml LV SV MOD BP:      33.5 ml RIGHT VENTRICLE            IVC RV Basal diam:  2.70 cm    IVC diam: 1.50 cm RV S prime:     9.03 cm/s LEFT ATRIUM           Index        RIGHT ATRIUM           Index LA diam:      3.10 cm 1.70 cm/m   RA Area:     12.10 cm LA Vol (A2C): 24.8 ml 13.60 ml/m  RA Volume:   26.50 ml  14.53 ml/m LA Vol (A4C): 30.8 ml 16.89 ml/m  AORTIC VALVE AV Area (Vmax):    2.04 cm AV Area (Vmean):   1.83 cm AV Area (VTI):     1.77 cm AV Vmax:           144.00 cm/s AV Vmean:          95.000 cm/s AV VTI:            0.210 m AV Peak Grad:      8.3 mmHg AV Mean Grad:      4.0 mmHg LVOT Vmax:         93.40 cm/s LVOT Vmean:        55.300 cm/s LVOT VTI:          0.118 m LVOT/AV VTI ratio: 0.56  AORTA Ao Root diam: 3.10 cm MITRAL VALVE MV Area (PHT): 5.88 cm    SHUNTS MV Decel Time: 129 msec    Systemic VTI:  0.12 m MV E velocity: 63.40 cm/s  Systemic Diam:  2.00 cm MV A velocity: 86.10 cm/s MV E/A ratio:  0.74 Lennie Odor MD Electronically signed by Lennie Odor MD Signature Date/Time: 09/05/2023/1:37:27 PM    Final     ASSESSMENT AND PLAN: This is a very pleasant 62 years old white female with Stage IVB ( T3, N2, M1c) non-Small Cell Lung Cancer, poorly differentiated squamous cell carcinoma presented with large right upper lobe lung mass in addition to large necrotic right hilar/mediastinal mass and innumerable hypermetabolic pulmonary metastatic lesions in addition to liver and bone metastasis as well as abdominal celiac axis lymphadenopathy diagnosed in October 2024.  Molecular studies showed negative PD-L1 expression and positive EGFR mutation [id number] She underwent palliative radiotherapy to the large hilar and mediastinal mass under  the care of Dr. Roselind Messier last dose on 09/04/23 She is currently on treatment with Tagrisso 80 mg p.o. daily started on September 29, 2023.    Stage IV Lung Cancer with EGFR Mutation Stage IV lung cancer with EGFR mutation. Currently on Tagrisso (osimertinib) for six days with mild diarrhea as a side effect. No skin rash reported. Recent PET scan showed active disease in the lung, liver, bone, and a suspicious area in the tonsil. Goal is to control the primary lung tumor with Tagrisso before addressing the tonsil lesion. Discussed the risks of delaying chemotherapy infusion to allow recovery from recent illness and the potential benefits of Tagrisso in shrinking tumors. - Continue Tagrisso - Monitor for side effects - Hold chemotherapy infusion for one more week - Reschedule chemotherapy infusion for next Thursday - Plan follow-up scan in two months to assess treatment response  Tonsil Lesion (Suspicious for Cancer) Suspicious lesion in the tonsil identified on PET scan. Decision to delay intervention until primary lung cancer is better controlled. ENT advised against immediate intervention due to overall disease burden. - Reassess tonsil lesion after lung cancer control is achieved  Steroid Management Currently on Decadron (dexamethasone) 4 mg every 8 hours for two weeks. Plan to taper steroids to reduce potential side effects and manage heart rate. Discussed the potential impact of steroids on heart rate and inflammation. - Reduce Decadron to 4 mg twice a day for one week - Then reduce to 4 mg once a day for one week - Then reduce to 2 mg once a day for one week - Re-evaluate after tapering  Insomnia Reports difficulty sleeping, currently getting 2-3 hours of solid sleep. Previously taking Xanax 0.5 mg at night without sufficient effect. Discussed starting Restoril and discontinuing Xanax temporarily. - Start Restoril (temazepam) 15 mg at night - Discontinue Xanax  General Health  Maintenance Advised to maintain activity and eat healthily. Steroid use may cause stomach irritation. Discussed the importance of physical activity and a balanced diet. - Encourage physical activity without overexertion - Continue Protonix to prevent stomach irritation from steroids  Follow-up - Reschedule chemotherapy infusion for next Thursday - Plan follow-up scan in two months - Monitor blood work and adjust treatment as needed.   The patient was advised to call immediately if she has any other concerning symptoms in the interval. The patient voices understanding of current disease status and treatment options and is in agreement with the current care plan.  All questions were answered. The patient knows to call the clinic with any problems, questions or concerns. We can certainly see the patient much sooner if necessary. The total time spent in the appointment was 30 minutes.  Disclaimer: This note was dictated with voice recognition software. Similar sounding words can  inadvertently be transcribed and may not be corrected upon review.

## 2023-10-06 ENCOUNTER — Encounter: Payer: Self-pay | Admitting: Internal Medicine

## 2023-10-06 ENCOUNTER — Ambulatory Visit: Payer: No Typology Code available for payment source

## 2023-10-06 NOTE — Progress Notes (Signed)
  Radiation Oncology         [phone] ________________________________  Name: Rebecca Ochoa MRN: [id number]  Date: 10/09/2023  DOB: Apr 18, 1961  End of Treatment Note  Diagnosis: The encounter diagnosis was Non-small cell cancer of right lung (HCC).   Stage IV non-small cell carcinoma of the right lung (likely squamous cell carcinoma) with bilateral pulmonary metastases, hepatic and nodal metastases, and possible osseous metastatic disease based on recent PET imaging     Indication for treatment: Palliative        Radiation treatment dates: First Treatment Date: 2023-08-16 - Last Treatment Date: 2023-09-04  Site/Dose/Technique/Mode:   Site: Lung, Right Technique: 3D Mode: Photon Dose Per Fraction: 2.5 Gy Prescribed Dose (Delivered / Prescribed): 35 Gy / 35 Gy Prescribed Fxs (Delivered / Prescribed): 14 / 14  Narrative: The patient tolerated radiation treatment relatively well.  On the date of her final treatment, the patient however endorsed esophageal pain, fatigue, and worsening SOB. She as also found to have an elevated heart rate and was instructed to present to the ED for further management.   Plan: The patient has completed radiation treatment. The patient will return to radiation oncology clinic for routine followup in one month. I advised them to call or return sooner if they have any questions or concerns related to their recovery or treatment.  -----------------------------------  Billie Lade, PhD, MD  This document serves as a record of services personally performed by Antony Blackbird, MD. It was created on his behalf by Neena Rhymes, a trained medical scribe. The creation of this record is based on the scribe's personal observations and the provider's statements to them. This document has been checked and approved by the attending provider.

## 2023-10-09 ENCOUNTER — Other Ambulatory Visit: Payer: Self-pay

## 2023-10-09 ENCOUNTER — Encounter: Payer: Self-pay | Admitting: Radiation Oncology

## 2023-10-09 ENCOUNTER — Ambulatory Visit
Admission: RE | Admit: 2023-10-09 | Discharge: 2023-10-09 | Disposition: A | Discharge status: Home / Self Care | Payer: No Typology Code available for payment source | Source: Ambulatory Visit | Attending: Radiation Oncology | Admitting: Radiation Oncology

## 2023-10-09 ENCOUNTER — Telehealth: Payer: Self-pay | Admitting: Internal Medicine

## 2023-10-09 VITALS — BP 114/75 | HR 78 | Temp 97.8°F | Resp 18 | Ht 65.0 in | Wt 150.0 lb

## 2023-10-09 DIAGNOSIS — C779 Secondary and unspecified malignant neoplasm of lymph node, unspecified: Secondary | ICD-10-CM | POA: Insufficient documentation

## 2023-10-09 DIAGNOSIS — C7801 Secondary malignant neoplasm of right lung: Secondary | ICD-10-CM | POA: Diagnosis not present

## 2023-10-09 DIAGNOSIS — C787 Secondary malignant neoplasm of liver and intrahepatic bile duct: Secondary | ICD-10-CM | POA: Diagnosis not present

## 2023-10-09 DIAGNOSIS — C3411 Malignant neoplasm of upper lobe, right bronchus or lung: Secondary | ICD-10-CM | POA: Diagnosis present

## 2023-10-09 DIAGNOSIS — Z923 Personal history of irradiation: Secondary | ICD-10-CM | POA: Diagnosis not present

## 2023-10-09 DIAGNOSIS — C7802 Secondary malignant neoplasm of left lung: Secondary | ICD-10-CM | POA: Insufficient documentation

## 2023-10-09 DIAGNOSIS — C7951 Secondary malignant neoplasm of bone: Secondary | ICD-10-CM | POA: Insufficient documentation

## 2023-10-09 NOTE — Progress Notes (Signed)
Radiation Oncology         [phone] ________________________________  Name: Rebecca Ochoa MRN: [id number]  Date: 10/09/2023  DOB: 1960-12-12  Follow-Up Visit Note  CC: Rebecca Cowboy, MD  Maryln Gottron, MD    ICD-10-CM   1. 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, hepatic and nodal metastases, and possible osseous metastatic disease based on recent PET imaging     Now with new evidence of a left tonsillar mass concerning for malignancy   Interval Since Last Radiation: 1 month and 5 days    Indication for treatment: Palliative         Radiation treatment dates: First Treatment Date: 2023-08-16 - Last Treatment Date: 2023-09-04   Site/Dose/Technique/Mode:    Site: Lung, Right Technique: 3D Mode: Photon Dose Per Fraction: 2.5 Gy Prescribed Dose (Delivered / Prescribed): 35 Gy / 35 Gy Prescribed Fxs (Delivered / Prescribed): 14 / 14  Narrative:  The patient returns today for routine follow-up. She tolerated radiation treatment relatively well.  On the date of her final treatment, the patient however endorsed esophageal pain, fatigue, and worsening SOB. She was also found to have an elevated heart rate and was instructed to present to the ED for further management.                Upon presenting to the ED on 09/04/23, she was admitted for management of acute bilateral PE. Hospital course consisted of heparin which was later transitioned to Eliquis. She was also discharged on 11/07 with a course of prednisone. Imaging performed while inpatient included:  -- CTA of the chest on 11/04 showed bilateral pulmonary emboli within the proximal pulmonary arteries with an overall severe clot burden. CT also redemonstrated the RUL and right perihilar masses consistent with primary carcinoma, with a slight decrease in  size of the right perihilar mass demonstrated. The numerous other bilateral pulmonary metastases otherwise appeared stable.   She was again seen in the ED on 09/12/23 for evaluation of tachycardia noted during a visit with Dr. Arbutus Ped. She also c/o ongoing pleuritic chest pain and dyspnea on exertion which were unchanged since being discharged on 11/07. A repeat CTA of the chest was performed which revealed decreasing clot burden, no evidence of right heart strain, and stability of the right lung masses. The remainder of her ED work-up including labs were unremarkable, and she was discharged with instructions to follow-up with her PCP and OP oncology.   She was also hospitalized from 09/18/23 through 09/25/23 after presenting with fever and headache with evidence of SIRS. Her hospital course consisted empiric Ceftriaxone and azithromycin with improvement.   She however had progressive headaches while inpatient which prompted an MRI of the brain and CT of the head on 11/22 which both showed new small foci of hemorrhage in the left occipital lobe and left parietal lobe with surrounding cerebral edema. Metastatic disease could not be excluded pertaining to this finding.   Based on MRI findings, she also had a CTA of the head and neck performed while inpatient on 11/23 which further revealed a superimposed left palatine tonsillar enhancing mass, measuring up to 2.3 cm. No evidence of malignant cervical lymphadenopathy was demonstrated, but the above constellation of findings raises the possibility of metastatic head and neck cancer or synchronous lung and tonsillar primaries. No new  interval intracranial abnormalities were demonstrated otherwise when compared to her prior MRI and CT from 11/22.   After being discharged, she was accordingly referred to Dr. Ernestene Kiel at [facility] on 11/26 for further management based on her neck CT findings. A laryngoscopy was performed during this visit which revealed a left tonsil  tumor extending beyond the tonsillar fossa.   She recently followed up with Dr. Arbutus Ped on 10/05/23 to discuss these new findings. Per Dr. Asa Lente recommendation, the goal moving forward is to control the primary lung tumor with Tagrisso before addressing the tonsillar lesion. ENT has also advised against against immediate intervention due to her overall disease burden.    Dr. Arbutus Ped recommended holding her chemotherapy start date at that time for one week to allow her some more time to recover from her recent illness. Chemotherapy with consist of carboplatin and Alimta. Her current tentative start date is set for 10/12/23.  Of note: In light of her recent neck CTA findings revealing a tonsillar mass, her PET scan from 08/11/23 was revisited. The new addendum report notes a subtle asymmetry of FDG uptake in the tonsillar region of the oropharynx bilaterally, left greater than right.   Patient states she is doing "okay" overall. She notes an intermittent dry cough, but denies any dyspnea on exertion, hemoptysis, dysphagia, chest pain, or any other breathing issues.   Allergies:  is allergic to biaxin [clarithromycin].  Meds: Current Outpatient Medications  Medication Sig Dispense Refill   acetaminophen (TYLENOL) 500 MG tablet Take 1,000 mg by mouth every 8 (eight) hours as needed for mild pain.     albuterol (VENTOLIN HFA) 108 (90 Base) MCG/ACT inhaler Inhale 2 puffs into the lungs every 6 (six) hours as needed for wheezing or shortness of breath. 8 g 2   ALPRAZolam (XANAX) 0.5 MG tablet Take 1 tablet (0.5 mg total) by mouth See admin instructions. Take 0.5 mg by mouth at bedtime and an additional 0.5 mg once a day as needed for anxiety 60 tablet 0   apixaban (ELIQUIS) 5 MG TABS tablet Take 1 tablet (5 mg total) by mouth 2 (two) times daily. 60 tablet 1   dexamethasone (DECADRON) 4 MG tablet Take 1 tablet (4 mg total) by mouth every 8 (eight) hours. 120 tablet 0   folic acid (FOLVITE) 1 MG  tablet Take 1 tablet (1 mg total) by mouth daily. 30 tablet 2   HYDROcodone-acetaminophen (NORCO/VICODIN) 5-325 MG tablet Take 1 tablet by mouth every 6 (six) hours as needed. (Patient taking differently: Take 1 tablet by mouth every 6 (six) hours as needed for moderate pain (pain score 4-6).) 21 tablet 0   ondansetron (ZOFRAN) 4 MG tablet Take 1 tablet (4 mg total) by mouth every 6 (six) hours as needed for nausea. 20 tablet 0   pantoprazole (PROTONIX) 40 MG tablet Take 1 tablet (40 mg total) by mouth 2 (two) times daily. 30 tablet 2   temazepam (RESTORIL) 15 MG capsule Take 1 capsule (15 mg total) by mouth at bedtime as needed for sleep. 30 capsule 0   feeding supplement (ENSURE ENLIVE / ENSURE PLUS) LIQD Take 237 mLs by mouth 2 (two) times daily between meals. (Patient not taking: Reported on 10/09/2023) 237 mL 12   Nutritional Supplements (,FEEDING SUPPLEMENT, PROSOURCE PLUS) liquid Take 30 mLs by mouth 4 (four) times daily - after meals and at bedtime. (Patient not taking: Reported on 10/09/2023) 887 mL 1   sucralfate (CARAFATE) 1 GM/10ML suspension Take 10 mLs (1 g  total) by mouth 4 (four) times daily -  with meals and at bedtime. (Patient not taking: Reported on 10/09/2023) 420 mL 0   No current facility-administered medications for this encounter.    Physical Findings: The patient is in no acute distress. Patient is alert and oriented.  Remains in wheelchair for evaluation.  height is 5\' 5"  (1.651 m) and weight is 150 lb (68 kg). Her temperature is 97.8 F (36.6 C). Her blood pressure is 114/75 and her pulse is 78. Her respiration is 18 and oxygen saturation is 99%. .  No significant changes. Lungs are clear to auscultation bilaterally. Heart has regular rate and rhythm. No palpable cervical, supraclavicular, or axillary adenopathy. Abdomen soft, non-tender, normal bowel sounds.   Lab Findings: Lab Results  Component Value Date   WBC 16.0 (H) 10/05/2023   HGB 12.2 10/05/2023   HCT 39.5  10/05/2023   MCV 88.8 10/05/2023   PLT 146 (L) 10/05/2023    Radiographic Findings: CT ANGIO HEAD NECK W WO CM  Result Date: 09/23/2023 CLINICAL DATA:  62 year old female with headache. Small areas of left occipital lobe and left parietal lobe hemorrhage and edema on the MRI yesterday. History of lung cancer. EXAM: CT ANGIOGRAPHY HEAD AND NECK WITH AND WITHOUT CONTRAST TECHNIQUE: Multidetector CT imaging of the head and neck was performed using the standard protocol during bolus administration of intravenous contrast. Multiplanar CT image reconstructions and MIPs were obtained to evaluate the vascular anatomy. Carotid stenosis measurements (when applicable) are obtained utilizing NASCET criteria, using the distal internal carotid diameter as the denominator. RADIATION DOSE REDUCTION: This exam was performed according to the departmental dose-optimization program which includes automated exposure control, adjustment of the mA and/or kV according to patient size and/or use of iterative reconstruction technique. CONTRAST:  75mL OMNIPAQUE IOHEXOL 350 MG/ML SOLN COMPARISON:  Brain MRI and noncontrast head CT yesterday. FINDINGS: CT HEAD Brain: There remains no intracranial mass effect or ventriculomegaly. Mild left occipital pole edema is stable (series 4, image 17), but the left parietal findings and the underlying microhemorrhage at both areas is not apparent by CT. No new intracranial abnormality. Calvarium and skull base: No acute or suspicious osseous lesion. Paranasal sinuses: Visualized paranasal sinuses and mastoids are stable and well aerated. Orbits: Leftward gaze, otherwise negative orbits and scalp. CTA NECK Skeleton: Age-appropriate cervical spine degeneration. No acute or suspicious osseous lesion. Upper chest: Upper lung mass is greater on the right. CT CTA chest 09/12/2023. Other neck: Heterogeneously enhancing left tonsillar pillar mass, 16 x 21 x 23 mm (AP by transverse by CC) at the level of  the uvula. Left level 2 lymph nodes remain within normal limits. No parapharyngeal or retropharyngeal space inflammation. No other neck mass or lymphadenopathy. Aortic arch: Calcified aortic atherosclerosis.  3 vessel arch. Right carotid system: Mild for age atherosclerosis and no stenosis. Left carotid system: Left CCA origin plaque without stenosis. Mild soft plaque in the left CCA and at the left ICA origin without significant stenosis. Vertebral arteries: Mild proximal right subclavian artery plaque without stenosis. Right vertebral artery origin calcified plaque with up to moderate stenosis series 13, image 71. Right vertebral remains patent to the skull base with no additional plaque or stenosis and is non dominant. Proximal left subclavian artery plaque without stenosis. Calcified plaque at the left vertebral artery origin but only mild stenosis on series 13, image 67. Dominant left vertebral. Mildly tortuous. No additional stenosis to the skull base. CTA HEAD Posterior circulation: Distal vertebral arteries and  vertebrobasilar junction are patent without stenosis. Left V4 is dominant. Normal PICA origins. Patent basilar artery with calcified plaque on series 12, image 122. But no basilar stenosis. Patent SCA and left PCA origins. Fetal type right PCA origin. Bilateral PCA branches are within normal limits. Anterior circulation: Both ICA siphons are patent. Left siphon anterior genu calcified plaque with only mild stenosis. Right siphon cavernous segment calcified plaque with only mild stenosis. Normal right posterior communicating artery origin. Patent carotid termini. Normal MCA and ACA origins. Dominant right A1. Normal anterior communicating artery. Bilateral ACA branches are within normal limits. Bilateral MCA branches are within normal limits. Venous sinuses: Patent. Anatomic variants: Dominant left vertebral artery. Fetal type right PCA origin. Dominant right A1. Other findings: No abnormal brain  enhancement is identified. Review of the MIP images confirms the above findings IMPRESSION: 1. Upper lung masses as detailed on 09/12/2023 chest CTA. Superimposed left palatine tonsillar enhancing mass, up to 2.3 cm. No malignant cervical lymphadenopathy, but the constellation raises the possibility of metastatic head and neck cancer or synchronous lung and tonsillar primaries. 2. Negative for large vessel occlusion. 3. Up to moderate atherosclerotic stenosis of the non dominant Right Vertebral Artery. Dominant Left Vertebral Artery with no stenosis. 4. No other hemodynamically significant arterial stenosis. 5. Stable CT appearance of the brain since yesterday. No new intracranial abnormality. 6.  Aortic Atherosclerosis (ICD10-I70.0). Electronically Signed   By: Odessa Fleming M.D.   On: 09/23/2023 10:59   CT HEAD WO CONTRAST ( )  Result Date: 09/22/2023 CLINICAL DATA:  Seven and severe headache, metastatic disease suspected EXAM: CT HEAD WITHOUT CONTRAST TECHNIQUE: Contiguous axial images were obtained from the base of the skull through the vertex without intravenous contrast. RADIATION DOSE REDUCTION: This exam was performed according to the departmental dose-optimization program which includes automated exposure control, adjustment of the mA and/or kV according to patient size and/or use of iterative reconstruction technique. COMPARISON:  No prior CT head available 09/22/2023 MRI head FINDINGS: Brain: Small area of hypodensity in the left occipital lobe, with minimal hyperdensity, which likely correlates to the small focus of hemorrhage with associated edema noted on the prior MRI. The smaller focus of edema in the left parietal lobe does not have as clear a correlate, but the hemorrhage may correlate with a small area of hyperdensity (series 6, image 33). No definite mass. No evidence of acute infarction, mass effect, or midline shift. No hydrocephalus. Vascular: No hyperdense vessel. Skull: Negative for  fracture or focal lesion. Sinuses/Orbits: No acute finding. Other: The mastoid air cells are well aerated. IMPRESSION: 1. Small area of hypodensity in the left occipital lobe, with minimal hyperdensity, correlates with the small focus of hemorrhage and associated edema noted on the prior MRI. 2. The smaller focus of edema in the left parietal lobe does not have as clear a correlate on CT, but the hemorrhage may correlate with a small area of hyperdensity. No definite mass. 3. Process. No additional acute intracranial Electronically Signed   By: Wiliam Ke M.D.   On: 09/22/2023 21:52   MR BRAIN W WO CONTRAST  Result Date: 09/22/2023 CLINICAL DATA:  Headache EXAM: MRI HEAD WITHOUT AND WITH CONTRAST TECHNIQUE: Multiplanar, multiecho pulse sequences of the brain and surrounding structures were obtained without and with intravenous contrast. CONTRAST:  7mL GADAVIST GADOBUTROL 1 MMOL/ML IV SOLN COMPARISON:  Brain MR 08/05/2023 FINDINGS: Brain: Negative for an acute infarct. No hemorrhage. No hydrocephalus. No extra-axial fluid collection. There is a background of mild chronic  microvascular ischemic change. Compared to prior exam there is a new focus of susceptibility artifact in the left occipital lobe with surrounding cerebral edema. There is no contrast enhancement associated with this site of hemorrhage, but an underlying metastatic lesion is not excluded. Additional similar focus of susceptibility artifact in the left parietal lobe (series 10, image 53). Recommend follow up contrast-enhanced brain MRI in 3 months to assess for interval change. Vascular: Normal flow voids. Skull and upper cervical spine: Normal marrow signal. Sinuses/Orbits: Middle ear or mastoid effusion. Paranasal sinuses are clear. Orbits are unremarkable. Other: None. IMPRESSION: Compared to prior exam there are new small foci of hemorrhage in the left occipital lobe and left parietal lobe with surrounding cerebral edema. There is no  contrast enhancement associated with sites. These are nonspecific, but metastatic disease is not excluded. Recommend follow up contrast-enhanced brain MRI in 3 months to assess for interval change. Electronically Signed   By: Lorenza Cambridge M.D.   On: 09/22/2023 17:19   US Abdomen Limited RUQ (LIVER/GB)  Result Date: 09/18/2023 CLINICAL DATA:  Elevated liver function tests. EXAM: ULTRASOUND ABDOMEN LIMITED RIGHT UPPER QUADRANT COMPARISON:  None Available. FINDINGS: Gallbladder: The gallbladder is contracted. No gallstones or wall thickening visualized (1.9 mm). No sonographic Murphy sign noted by sonographer. Common bile duct: Diameter: 3.1 mm Liver: Heterogeneous echogenic masslike lesions are seen within the left lobe of the liver. The largest measures approximately 5.3 cm x 4.6 cm x 3.4 cm. Diffusely increased echogenicity of the liver parenchyma is noted. Portal vein is patent on color Doppler imaging with normal direction of blood flow towards the liver. Other: None. IMPRESSION: 1. Hepatic steatosis with multiple large heterogeneous liver lesions, concerning for the presence of hepatic metastasis. Electronically Signed   By: Aram Candela M.D.   On: 09/18/2023 21:53   DG Chest 2 View  Result Date: 09/18/2023 CLINICAL DATA:  Shortness of breath. Nonproductive cough. Suspected Sepsis EXAM: CHEST - 2 VIEW COMPARISON:  09/04/2023. FINDINGS: Redemonstration of multiple lobulated opacities throughout bilateral lungs, right more than left, compatible with patient's known history of pulmonary metastasis. No acute consolidation or lung collapse seen. Bilateral costophrenic angles are clear. Note is made of elevated right hemidiaphragm. Normal cardio-mediastinal silhouette. No acute osseous abnormalities. The soft tissues are within normal limits. Left-sided CT Port-A-Cath is again seen with its tip overlying the cavoatrial junction region. IMPRESSION: *No active cardiopulmonary disease. *Redemonstration of  extensive lung opacities, compatible with metastases. Electronically Signed   By: Jules Schick M.D.   On: 09/18/2023 16:00   CT Angio Chest PE W/Cm &/Or Wo Cm  Result Date: 09/12/2023 CLINICAL DATA:  Bilateral pulmonary emboli diagnosis 09/04/2023. Bilateral pulmonary metastasis. RIGHT upper lobe pulmonary masses. * Tracking Code: BO * EXAM: CT ANGIOGRAPHY CHEST WITH CONTRAST TECHNIQUE: Multidetector CT imaging of the chest was performed using the standard protocol during bolus administration of intravenous contrast. Multiplanar CT image reconstructions and MIPs were obtained to evaluate the vascular anatomy. RADIATION DOSE REDUCTION: This exam was performed according to the departmental dose-optimization program which includes automated exposure control, adjustment of the mA and/or kV according to patient size and/or use of iterative reconstruction technique. CONTRAST:  75mL OMNIPAQUE IOHEXOL 350 MG/ML SOLN COMPARISON:  CT 09/04/2023 FINDINGS: Cardiovascular: Interval partial clearing of the clot burden from the RIGHT lower lobe pulmonary artery thromboemboli. No significant clearing of the LEFT lower lobe pulmonary emboli. Interval clearing of the thrombus in the lingular pulmonary artery. No evidence of new pulmonary emboli. No RIGHT ventricular  strain. Mediastinum/Nodes: RIGHT suprahilar mass encroaches upon the mediastinum. No supraclavicular adenopathy. No pericardial fluid. Lungs/Pleura: Numeral bilateral pulmonary metastasis not changed in short a day interval. Large RIGHT upper lobe pulmonary mass measuring 6. 5.5 cm is unchanged. RIGHT suprahilar mass measuring 5.4 cm is unchanged. Limited view of the liver, kidneys, pancreas are unremarkable. Normal adrenal glands. Upper Abdomen: Limited view of the liver, kidneys, pancreas are unremarkable. Normal adrenal glands. Musculoskeletal: No aggressive osseous lesion. Review of the MIP images confirms the above findings. IMPRESSION: 1. Interval partial  clearing of the acute pulmonary thromboemboli diagnosed on 09/04/2023. 2. No evidence of new pulmonary emboli. No RIGHT ventricular strain. 3. RIGHT upper lobe and RIGHT suprahilar masses are unchanged. 4. Numerous bilateral pulmonary metastasis are unchanged. Electronically Signed   By: Genevive Bi M.D.   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    Plan:  Patient will continue follow-up care with Dr. Arbutus Ped. She is scheduled to see palliative care tomorrow. Radiation follow-up PRN.   We appreciate the opportunity to take part in this patient's care.    20 minutes of total time was spent for this patient encounter, including preparation, face-to-face counseling with the patient and coordination of care, physical exam, and documentation of the encounter. ____________________________________   Bryan Lemma, PA-C   Billie Lade, PhD, MD   [facility]  Radiation Oncology Direct Dial: [phone]  Fax: [phone] [email or web address]    This document serves as a record of services personally performed by Antony Blackbird, MD and Bryan Lemma, PA-C. It was created on his behalf by Neena Rhymes, a trained medical scribe. The creation of this record is based on the scribe's personal observations and the provider's statements to them. This document has been checked and approved by the attending provider.

## 2023-10-09 NOTE — Telephone Encounter (Signed)
Patient is aware of scheduled appointment times/dates

## 2023-10-09 NOTE — Progress Notes (Unsigned)
Palliative Medicine [facility]  [phone] [phone]   Name: Rebecca Ochoa Date: 10/09/2023 MRN: [id number]  DOB: Aug 10, 1961  Patient Care Team: [name], MD as PCP - General (Internal Medicine) Wyline Mood Alben Spittle, MD as PCP - Cardiology (Cardiology) Hanley Seamen Dustin Folks, MD as Referring Physician (Optometry) Ilda Mori, MD as Consulting Physician (Obstetrics and Gynecology) Arminda Resides, MD as Consulting Physician (Dermatology) Lytle Butte, RN as Registered Nurse [name], MD as Referring Physician (Internal Medicine)    REASON FOR CONSULTATION: Rebecca Ochoa is a 62 y.o. female with oncologic medical history including non-small cell lung cancer (08/2023) and innumerable hypermetabolic pulmonary metastatic lesions in addition to liver and bone metastasis as well as abdominal celiac axis lymphadenopathy.  Palliative ask to see for symptom management and goals of care.    SOCIAL HISTORY:     reports that she has been smoking e-cigarettes. She has never used smokeless tobacco. She reports current alcohol use of about 3.0 standard drinks of alcohol per week. She reports that she does not use drugs.  ADVANCE DIRECTIVES:  None on file  CODE STATUS: Full code  PAST MEDICAL HISTORY: Past Medical History:  Diagnosis Date   Anemia    Asthma, mild intermittent, well-controlled    GERD (gastroesophageal reflux disease)    Hx of migraines    Hyperlipidemia    Hypertension    Vitamin D deficiency     PAST SURGICAL HISTORY:  Past Surgical History:  Procedure Laterality Date   BREAST SURGERY     reduction   BRONCHIAL BIOPSY  08/08/2023   Procedure: BRONCHIAL BIOPSIES;  Surgeon: Leslye Peer, MD;  Location: [facility] ENDOSCOPY;  Service: Pulmonary;;   BRONCHIAL BRUSHINGS  08/08/2023   Procedure: BRONCHIAL BRUSHINGS;  Surgeon: Leslye Peer, MD;  Location: [facility] ENDOSCOPY;  Service: Pulmonary;;   BRONCHIAL NEEDLE ASPIRATION  BIOPSY  08/08/2023   Procedure: BRONCHIAL NEEDLE ASPIRATION BIOPSIES;  Surgeon: Leslye Peer, MD;  Location: [facility] ENDOSCOPY;  Service: Pulmonary;;   ENDOBRONCHIAL ULTRASOUND Bilateral 08/08/2023   Procedure: ENDOBRONCHIAL ULTRASOUND;  Surgeon: Leslye Peer, MD;  Location: [facility] ENDOSCOPY;  Service: Pulmonary;  Laterality: Bilateral;   HEMOSTASIS CONTROL  08/08/2023   Procedure: HEMOSTASIS CONTROL;  Surgeon: Leslye Peer, MD;  Location: [facility] ENDOSCOPY;  Service: Pulmonary;;   IR IMAGING GUIDED PORT INSERTION  09/01/2023   REDUCTION MAMMAPLASTY     TUBAL LIGATION      HEMATOLOGY/ONCOLOGY HISTORY:  Oncology History  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)  08/24/2023 Initial Diagnosis   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  MEDICATIONS:  Current Outpatient Medications  Medication Sig Dispense Refill   acetaminophen (TYLENOL) 500 MG tablet Take 1,000 mg by mouth every 8 (eight) hours as needed for mild pain.     albuterol (VENTOLIN HFA) 108 (90 Base) MCG/ACT inhaler  Inhale 2 puffs into the lungs every 6 (six) hours as needed for wheezing or shortness of breath. 8 g 2   ALPRAZolam (XANAX) 0.5 MG tablet Take 1 tablet (0.5 mg total) by mouth See admin instructions. Take 0.5 mg by mouth at bedtime and an additional 0.5 mg once a day as needed for anxiety 60 tablet 0   apixaban (ELIQUIS) 5 MG TABS tablet Take 1 tablet  (5 mg total) by mouth 2 (two) times daily. 60 tablet 1   dexamethasone (DECADRON) 4 MG tablet Take 1 tablet (4 mg total) by mouth every 8 (eight) hours. 120 tablet 0   feeding supplement (ENSURE ENLIVE / ENSURE PLUS) LIQD Take 237 mLs by mouth 2 (two) times daily between meals. 237 mL 12   folic acid (FOLVITE) 1 MG tablet Take 1 tablet (1 mg total) by mouth daily. 30 tablet 2   HYDROcodone-acetaminophen (NORCO/VICODIN) 5-325 MG tablet Take 1 tablet by mouth every 6 (six) hours as needed. (Patient taking differently: Take 1 tablet by mouth every 6 (six) hours as needed for moderate pain (pain score 4-6).) 21 tablet 0   Nutritional Supplements (,FEEDING SUPPLEMENT, PROSOURCE PLUS) liquid Take 30 mLs by mouth 4 (four) times daily - after meals and at bedtime. 887 mL 1   ondansetron (ZOFRAN) 4 MG tablet Take 1 tablet (4 mg total) by mouth every 6 (six) hours as needed for nausea. 20 tablet 0   pantoprazole (PROTONIX) 40 MG tablet Take 1 tablet (40 mg total) by mouth 2 (two) times daily. 30 tablet 2   sucralfate (CARAFATE) 1 GM/10ML suspension Take 10 mLs (1 g total) by mouth 4 (four) times daily -  with meals and at bedtime. 420 mL 0   temazepam (RESTORIL) 15 MG capsule Take 1 capsule (15 mg total) by mouth at bedtime as needed for sleep. 30 capsule 0   No current facility-administered medications for this visit.    VITAL SIGNS: There were no vitals taken for this visit. There were no vitals filed for this visit.  Estimated body mass index is 25.38 kg/m as calculated from the following:   Height as of 10/05/23: 5\' 5"  (1.651 m).   Weight as of 10/05/23: 152 lb 8 oz (69.2 kg).  LABS: CBC:    Component Value Date/Time   WBC 16.0 (H) 10/05/2023 0900   WBC 7.7 09/25/2023 0550   HGB 12.2 10/05/2023 0900   HCT 39.5 10/05/2023 0900   PLT 146 (L) 10/05/2023 0900   MCV 88.8 10/05/2023 0900   NEUTROABS 15.2 (H) 10/05/2023 0900   LYMPHSABS 0.2 (L) 10/05/2023 0900   MONOABS 0.4 10/05/2023 0900    EOSABS 0.1 10/05/2023 0900   BASOSABS 0.0 10/05/2023 0900   Comprehensive Metabolic Panel:    Component Value Date/Time   NA 138 10/05/2023 0900   K 4.3 10/05/2023 0900   CL 104 10/05/2023 0900   CO2 28 10/05/2023 0900   BUN 19 10/05/2023 0900   CREATININE 0.68 10/05/2023 0900   CREATININE 1.05 10/12/2022 1442   GLUCOSE 147 (H) 10/05/2023 0900   CALCIUM 9.2 10/05/2023 0900   AST 69 (H) 10/05/2023 0900   ALT 138 (H) 10/05/2023 0900   ALKPHOS 162 (H) 10/05/2023 0900   BILITOT 0.7 10/05/2023 0900   PROT 6.4 (L) 10/05/2023 0900   ALBUMIN 3.5 10/05/2023 0900    RADIOGRAPHIC STUDIES: CT ANGIO HEAD NECK W WO CM  Result Date: 09/23/2023 CLINICAL DATA:  62 year old female with headache. Small areas of left occipital lobe  and left parietal lobe hemorrhage and edema on the MRI yesterday. History of lung cancer. EXAM: CT ANGIOGRAPHY HEAD AND NECK WITH AND WITHOUT CONTRAST TECHNIQUE: Multidetector CT imaging of the head and neck was performed using the standard protocol during bolus administration of intravenous contrast. Multiplanar CT image reconstructions and MIPs were obtained to evaluate the vascular anatomy. Carotid stenosis measurements (when applicable) are obtained utilizing NASCET criteria, using the distal internal carotid diameter as the denominator. RADIATION DOSE REDUCTION: This exam was performed according to the departmental dose-optimization program which includes automated exposure control, adjustment of the mA and/or kV according to patient size and/or use of iterative reconstruction technique. CONTRAST:  75mL OMNIPAQUE IOHEXOL 350 MG/ML SOLN COMPARISON:  Brain MRI and noncontrast head CT yesterday. FINDINGS: CT HEAD Brain: There remains no intracranial mass effect or ventriculomegaly. Mild left occipital pole edema is stable (series 4, image 17), but the left parietal findings and the underlying microhemorrhage at both areas is not apparent by CT. No new intracranial abnormality.  Calvarium and skull base: No acute or suspicious osseous lesion. Paranasal sinuses: Visualized paranasal sinuses and mastoids are stable and well aerated. Orbits: Leftward gaze, otherwise negative orbits and scalp. CTA NECK Skeleton: Age-appropriate cervical spine degeneration. No acute or suspicious osseous lesion. Upper chest: Upper lung mass is greater on the right. CT CTA chest 09/12/2023. Other neck: Heterogeneously enhancing left tonsillar pillar mass, 16 x 21 x 23 mm (AP by transverse by CC) at the level of the uvula. Left level 2 lymph nodes remain within normal limits. No parapharyngeal or retropharyngeal space inflammation. No other neck mass or lymphadenopathy. Aortic arch: Calcified aortic atherosclerosis.  3 vessel arch. Right carotid system: Mild for age atherosclerosis and no stenosis. Left carotid system: Left CCA origin plaque without stenosis. Mild soft plaque in the left CCA and at the left ICA origin without significant stenosis. Vertebral arteries: Mild proximal right subclavian artery plaque without stenosis. Right vertebral artery origin calcified plaque with up to moderate stenosis series 13, image 71. Right vertebral remains patent to the skull base with no additional plaque or stenosis and is non dominant. Proximal left subclavian artery plaque without stenosis. Calcified plaque at the left vertebral artery origin but only mild stenosis on series 13, image 67. Dominant left vertebral. Mildly tortuous. No additional stenosis to the skull base. CTA HEAD Posterior circulation: Distal vertebral arteries and vertebrobasilar junction are patent without stenosis. Left V4 is dominant. Normal PICA origins. Patent basilar artery with calcified plaque on series 12, image 122. But no basilar stenosis. Patent SCA and left PCA origins. Fetal type right PCA origin. Bilateral PCA branches are within normal limits. Anterior circulation: Both ICA siphons are patent. Left siphon anterior genu calcified plaque  with only mild stenosis. Right siphon cavernous segment calcified plaque with only mild stenosis. Normal right posterior communicating artery origin. Patent carotid termini. Normal MCA and ACA origins. Dominant right A1. Normal anterior communicating artery. Bilateral ACA branches are within normal limits. Bilateral MCA branches are within normal limits. Venous sinuses: Patent. Anatomic variants: Dominant left vertebral artery. Fetal type right PCA origin. Dominant right A1. Other findings: No abnormal brain enhancement is identified. Review of the MIP images confirms the above findings IMPRESSION: 1. Upper lung masses as detailed on 09/12/2023 chest CTA. Superimposed left palatine tonsillar enhancing mass, up to 2.3 cm. No malignant cervical lymphadenopathy, but the constellation raises the possibility of metastatic head and neck cancer or synchronous lung and tonsillar primaries. 2. Negative for large vessel occlusion.  3. Up to moderate atherosclerotic stenosis of the non dominant Right Vertebral Artery. Dominant Left Vertebral Artery with no stenosis. 4. No other hemodynamically significant arterial stenosis. 5. Stable CT appearance of the brain since yesterday. No new intracranial abnormality. 6.  Aortic Atherosclerosis (ICD10-I70.0). Electronically Signed   By: Odessa Fleming M.D.   On: 09/23/2023 10:59   CT HEAD WO CONTRAST ( )  Result Date: 09/22/2023 CLINICAL DATA:  Seven and severe headache, metastatic disease suspected EXAM: CT HEAD WITHOUT CONTRAST TECHNIQUE: Contiguous axial images were obtained from the base of the skull through the vertex without intravenous contrast. RADIATION DOSE REDUCTION: This exam was performed according to the departmental dose-optimization program which includes automated exposure control, adjustment of the mA and/or kV according to patient size and/or use of iterative reconstruction technique. COMPARISON:  No prior CT head available 09/22/2023 MRI head FINDINGS: Brain: Small  area of hypodensity in the left occipital lobe, with minimal hyperdensity, which likely correlates to the small focus of hemorrhage with associated edema noted on the prior MRI. The smaller focus of edema in the left parietal lobe does not have as clear a correlate, but the hemorrhage may correlate with a small area of hyperdensity (series 6, image 33). No definite mass. No evidence of acute infarction, mass effect, or midline shift. No hydrocephalus. Vascular: No hyperdense vessel. Skull: Negative for fracture or focal lesion. Sinuses/Orbits: No acute finding. Other: The mastoid air cells are well aerated. IMPRESSION: 1. Small area of hypodensity in the left occipital lobe, with minimal hyperdensity, correlates with the small focus of hemorrhage and associated edema noted on the prior MRI. 2. The smaller focus of edema in the left parietal lobe does not have as clear a correlate on CT, but the hemorrhage may correlate with a small area of hyperdensity. No definite mass. 3. Process. No additional acute intracranial Electronically Signed   By: Wiliam Ke M.D.   On: 09/22/2023 21:52   MR BRAIN W WO CONTRAST  Result Date: 09/22/2023 CLINICAL DATA:  Headache EXAM: MRI HEAD WITHOUT AND WITH CONTRAST TECHNIQUE: Multiplanar, multiecho pulse sequences of the brain and surrounding structures were obtained without and with intravenous contrast. CONTRAST:  7mL GADAVIST GADOBUTROL 1 MMOL/ML IV SOLN COMPARISON:  Brain MR 08/05/2023 FINDINGS: Brain: Negative for an acute infarct. No hemorrhage. No hydrocephalus. No extra-axial fluid collection. There is a background of mild chronic microvascular ischemic change. Compared to prior exam there is a new focus of susceptibility artifact in the left occipital lobe with surrounding cerebral edema. There is no contrast enhancement associated with this site of hemorrhage, but an underlying metastatic lesion is not excluded. Additional similar focus of susceptibility artifact in  the left parietal lobe (series 10, image 53). Recommend follow up contrast-enhanced brain MRI in 3 months to assess for interval change. Vascular: Normal flow voids. Skull and upper cervical spine: Normal marrow signal. Sinuses/Orbits: Middle ear or mastoid effusion. Paranasal sinuses are clear. Orbits are unremarkable. Other: None. IMPRESSION: Compared to prior exam there are new small foci of hemorrhage in the left occipital lobe and left parietal lobe with surrounding cerebral edema. There is no contrast enhancement associated with sites. These are nonspecific, but metastatic disease is not excluded. Recommend follow up contrast-enhanced brain MRI in 3 months to assess for interval change. Electronically Signed   By: Lorenza Cambridge M.D.   On: 09/22/2023 17:19   PERFORMANCE STATUS (ECOG) : {CHL ONC ECOG ZO:[id number]}  Review of Systems Unless otherwise noted, a complete review of systems  is negative.  Physical Exam General: NAD Cardiovascular: regular rate and rhythm Pulmonary: clear ant fields Abdomen: soft, nontender, + bowel sounds Extremities: no edema, no joint deformities Skin: no rashes Neurological: Alert and oriented x3  IMPRESSION: *** I introduced myself, Maygan RN, and Palliative's role in collaboration with the oncology team. Concept of Palliative Care was introduced as specialized medical care for people and their families living with serious illness.  It focuses on providing relief from the symptoms and stress of a serious illness.  The goal is to improve quality of life for both the patient and the family. Values and goals of care important to patient and family were attempted to be elicited.    We discussed *** current illness and what it means in the larger context of *** on-going co-morbidities. Natural disease trajectory and expectations were discussed.  I discussed the importance of continued conversation with family and their medical providers regarding overall plan of  care and treatment options, ensuring decisions are within the context of the patients values and GOCs.  PLAN: Established therapeutic relationship. Education provided on palliative's role in collaboration with their Oncology/Radiation team. I will plan to see patient back in 2-4 weeks in collaboration to other oncology appointments.    Patient expressed understanding and was in agreement with this plan. She also understands that She can call the clinic at any time with any questions, concerns, or complaints.   Thank you for your referral and allowing Palliative to assist in Mrs. Jaynee Gosa's care.   Number and complexity of problems addressed: ***HIGH - 1 or more chronic illnesses with SEVERE exacerbation, progression, or side effects of treatment - advanced cancer, pain. Any controlled substances utilized were prescribed in the context of palliative care.   Visit consisted of counseling and education dealing with the complex and emotionally intense issues of symptom management and palliative care in the setting of serious and potentially life-threatening illness.  Signed by: Willette Alma, AGPCNP-BC Palliative Medicine Team/[facility]   *Please note that this is a verbal dictation therefore any spelling or grammatical errors are due to the "[employer]agon [employer] One" system interpretation.

## 2023-10-09 NOTE — Progress Notes (Signed)
Rebecca Ochoa is here today for follow up post radiation to the lung.  Lung Side: Right  Does the patient complain of any of the following: Pain: Denies pain Shortness of breath w/wo exertion: No Cough: She reports coughing intermittently. Hemoptysis: Denies Pain when swallowing: Denies Swallowing/choking concerns: Denies Appetite: Fair Energy Level: Poor Post radiation skin Changes: Denies  BP 114/75 (BP Location: Left Arm, Patient Position: Sitting, Cuff Size: Normal)   Pulse 78   Temp 97.8 F (36.6 C)   Resp 18   Ht 5\' 5"  (1.651 m)   Wt 150 lb (68 kg)   SpO2 99%   BMI 24.96 kg/m      Additional comments if applicable:

## 2023-10-10 ENCOUNTER — Other Ambulatory Visit: Payer: No Typology Code available for payment source

## 2023-10-10 ENCOUNTER — Inpatient Hospital Stay (HOSPITAL_BASED_OUTPATIENT_CLINIC_OR_DEPARTMENT_OTHER): Payer: No Typology Code available for payment source | Admitting: Nurse Practitioner

## 2023-10-10 ENCOUNTER — Encounter: Payer: Self-pay | Admitting: Nurse Practitioner

## 2023-10-10 ENCOUNTER — Inpatient Hospital Stay: Payer: No Typology Code available for payment source | Admitting: Dietician

## 2023-10-10 VITALS — BP 117/69 | HR 77 | Temp 97.7°F | Resp 15 | Wt 151.5 lb

## 2023-10-10 DIAGNOSIS — R530 Neoplastic (malignant) related fatigue: Secondary | ICD-10-CM

## 2023-10-10 DIAGNOSIS — R630 Anorexia: Secondary | ICD-10-CM | POA: Diagnosis not present

## 2023-10-10 DIAGNOSIS — G4709 Other insomnia: Secondary | ICD-10-CM

## 2023-10-10 DIAGNOSIS — G893 Neoplasm related pain (acute) (chronic): Secondary | ICD-10-CM

## 2023-10-10 DIAGNOSIS — Z515 Encounter for palliative care: Secondary | ICD-10-CM

## 2023-10-10 DIAGNOSIS — C3411 Malignant neoplasm of upper lobe, right bronchus or lung: Secondary | ICD-10-CM

## 2023-10-10 NOTE — Progress Notes (Signed)
Nutrition Assessment   Reason for Assessment: Wt loss   ASSESSMENT: 62 year old female with stage IV NSCLC (diagnosed 08/2023). S/p palliative radiation to large hilar and mediastinal mass under the care of Dr. Roselind Messier (final RT 11/4). She is currently receiving Tagrisso 80 mg/day. Planning to start concurrent systemic chemotherapy with carboplatin + alimta. Patient under the care of Dr. Arbutus Ped   Past medical history includes HTN, PE, intracerebral hemorrhage, HLD, vit D deficiency, migraines, tobacco dependence, anxiety  11/18-11/25 admission with SIRS 11/4 - 11/7 admission with pulmonary embolism  Noted suspicious tonsil mass concerning for cancer - goal is to control primary lung tumor before addressing tonsil lesion per Dr. Arbutus Ped   Met with patient and brother in office. Patient arrives in wheelchair today. She reports decreased appetite secondary to early satiety and shortness of breath. Patient eating 3 small meals. Sometimes will snack. Typically eats bowl of cereal with whole milk for breakfast. Patient had half bowl tomato soup and grill cheese for lunch. She ate the other half for dinner. She does not care much for meat anymore, specifically tough chewy meats. Patient tolerates deli meat sandwiches without difficulty. She reports salty taste to foods. Patient is drinking ~24 ounces of water and 12 ounces of body armor. She does not like Boost/Ensure. They give her diarrhea. Patient endorses 2-3 loose-diarrhea episodes since starting tagrisso. She is fatigued from poor sleep. This is likely secondary to steroid taper.    Nutrition Focused Physical Exam: deferred - completed 09/22/23 by inpt RD   Medications: xanax, eliquis, decadron, folvite, norco, zofran, protonix, restoril   Labs: 12/5 glucose 147   Anthropometrics:   Height: 5'5" Weight: 151 lb 8 oz (12/10) UBW: 180 lb (10/2) BMI: 25.21    NUTRITION DIAGNOSIS: Unintended wt loss related to cancer (stg IV NSCLC) as  evidenced by reported increased work of breathing, fatigue, recent admissions, 16% wt loss in 2 months which is severe for time frame   MALNUTRITION DIAGNOSIS: Patient diagnosed with severe malnutrition 09/22/23 by inpt RD during last admission - ongoing   INTERVENTION:  Educated on importance of adequate calorie and protein energy intake to maintain weights/strength during treatment  Discussed strategies for decreased appetite/early satiety. Encouraged 4-6 small meals vs attempting 3 larger meals, suggestions for soft moist high protein foods for ease of intake, eating by clock/snack time alarms,    MONITORING, EVALUATION, GOAL: Patient will tolerate increased calories and protein to minimize further wt loss during treatment   Next Visit: To be scheduled with upcoming treatment as needed

## 2023-10-11 ENCOUNTER — Ambulatory Visit (INDEPENDENT_AMBULATORY_CARE_PROVIDER_SITE_OTHER): Payer: No Typology Code available for payment source | Admitting: Adult Health

## 2023-10-11 ENCOUNTER — Encounter: Payer: Self-pay | Admitting: Adult Health

## 2023-10-11 ENCOUNTER — Other Ambulatory Visit: Payer: Self-pay | Admitting: Nurse Practitioner

## 2023-10-11 VITALS — BP 98/57 | HR 88 | Ht 65.0 in | Wt 151.0 lb

## 2023-10-11 DIAGNOSIS — C3411 Malignant neoplasm of upper lobe, right bronchus or lung: Secondary | ICD-10-CM

## 2023-10-11 DIAGNOSIS — R9089 Other abnormal findings on diagnostic imaging of central nervous system: Secondary | ICD-10-CM | POA: Diagnosis not present

## 2023-10-11 DIAGNOSIS — I618 Other nontraumatic intracerebral hemorrhage: Secondary | ICD-10-CM

## 2023-10-11 DIAGNOSIS — F419 Anxiety disorder, unspecified: Secondary | ICD-10-CM

## 2023-10-11 MED ORDER — ALPRAZOLAM 0.5 MG PO TABS: 0.5000 mg | ORAL_TABLET | Freq: Three times a day (TID) | ORAL | 0 refills | Status: DC | PRN

## 2023-10-11 NOTE — Patient Instructions (Addendum)
Your Plan:  You will be called to schedule an MRI brain with and without contrast - we will call you with the results  Can use your Xanax prior to the MRI but please let me know if would like to use something else      Thank you for coming to see [place] at [facility]. I hope we have been able to provide you high quality care today.  You may receive a patient satisfaction survey over the next few weeks. We would appreciate your feedback and comments so that we may continue to improve ourselves and the health of our patients.

## 2023-10-11 NOTE — Progress Notes (Signed)
[facility] [address] [place]. [place] [address] [phone]       HOSPITAL FOLLOW UP NOTE  Ms. Rebecca Ochoa Date of Birth:  1961-04-22 Medical Record Number:  [id number]   Reason for Referral:  hospital stroke follow up    SUBJECTIVE:   CHIEF COMPLAINT:  Chief Complaint  Patient presents with   Follow-up    Patient in room #3 with her husband.  Patient states she here to discuss her brain bleed and swelling. Patient states she wants to know if she had a stroke.    HPI:   Ms. Rebecca Ochoa is a 62 y.o. 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  Put on Rocephin and azithromycin, symptom improved.  However, MRI showed new left occipital and left parietal microhemorrhages with surrounding edema, unclear etiology, possibly due to Eliquis vs underlying metastasis vs late subacute infarct with petechial hemorrhagic conversion of hypercoagulable state.  Recommended repeat MRI brain in 3-4 weeks for follow up.  CTA head/neck negative LVO, moderate stenosis of nondominant right VA as well as incidental finding of superimposed left palate teen tonsillar enhancing mass, up to 2.3cm.  EF 60 to 65%.  LDL 67.  A1c 5.8.  Eliquis placed on hold for short duration but resumed prior to discharge.  No statin indicated as LDL within goal.  She was placed on Decadron until outpatient oncology follow-up.  She was discharged home on 11/25.   Today, 10/11/2023, patient is being seen for initial hospital follow-up accompanied by her husband.  Reports overall stable from neurological standpoint since discharge.  No new stroke/TIA symptoms.  Routinely follows with oncology with plans on starting chemo this Friday, 12/13. Does note overall deconditioning and weight loss since initial cancer diagnosis, is able to ambulate short distance  without AD otherwise transfers via wheelchair, she also gets short of breath easily which limits activity. She has been trying to work on increasing her strength at home. Remains on Eliquis, denies side effects, reports she was told this would be continued lifelong.  BP occasionally monitored at home and typically 115-120s/70-80s.  She also routinely follows with PCP.       PERTINENT IMAGING  MRI left parietal and left occipital micro bleeding surrounding edema and left occipital region CT head small area of hypodensity in the left occipital lobe with minimal hyperdensity correlation with a small focus of hemorrhage and associated edema noted on the prior MRI. CT repeat stable finding CT head and neck no LVO, up to moderate stenosis of nondominant right VA 2D Echo EF 60 to 65% LDL 67 HgbA1c 5.8    ROS:   14 system review of systems performed and negative with exception of those listed in HPI  PMH:  Past Medical History:  Diagnosis Date   Anemia    Asthma, mild intermittent, well-controlled    GERD (gastroesophageal reflux disease)    Hx of migraines    Hyperlipidemia    Hypertension    Vitamin D deficiency     PSH:  Past Surgical History:  Procedure Laterality Date   BREAST SURGERY     reduction   BRONCHIAL BIOPSY  08/08/2023   Procedure: BRONCHIAL BIOPSIES;  Surgeon: Leslye Peer, MD;  Location: [facility] ENDOSCOPY;  Service: Pulmonary;;   BRONCHIAL BRUSHINGS  08/08/2023   Procedure: BRONCHIAL BRUSHINGS;  Surgeon: Leslye Peer, MD;  Location:  [facility] ENDOSCOPY;  Service: Pulmonary;;   BRONCHIAL NEEDLE ASPIRATION BIOPSY  08/08/2023   Procedure: BRONCHIAL NEEDLE ASPIRATION BIOPSIES;  Surgeon: Leslye Peer, MD;  Location[facility] ENDOSCOPY;  Service: Pulmonary;;   ENDOBRONCHIAL ULTRASOUND Bilateral 08/08/2023   Procedure: ENDOBRONCHIAL ULTRASOUND;  Surgeon: Leslye Peer, MD;  Location: [facility] ENDOSCOPY;  Service: Pulmonary;  Laterality: Bilateral;   HEMOSTASIS CONTROL  08/08/2023    Procedure: HEMOSTASIS CONTROL;  Surgeon: Leslye Peer, MD;  Location: [facility] ENDOSCOPY;  Service: Pulmonary;;   IR IMAGING GUIDED PORT INSERTION  09/01/2023   REDUCTION MAMMAPLASTY     TUBAL LIGATION      Social History:  Social History   Socioeconomic History   Marital status: Married    Spouse name: Not on file   Number of children: Not on file   Years of education: Not on file   Highest education level: Not on file  Occupational History   Not on file  Tobacco Use   Smoking status: Every Day    Types: E-cigarettes   Smokeless tobacco: Never  Vaping Use   Vaping status: Every Day  Substance and Sexual Activity   Alcohol use: Yes    Alcohol/week: 3.0 standard drinks of alcohol    Types: 3 Standard drinks or equivalent per week   Drug use: Never   Sexual activity: Not on file  Other Topics Concern   Not on file  Social History Narrative   Not on file   Social Determinants of Health   Financial Resource Strain: Not on file  Food Insecurity: No Food Insecurity (09/18/2023)   Hunger Vital Sign    Worried About Running Out of Food in the Last Year: Never true    Ran Out of Food in the Last Year: Never true  Transportation Needs: No Transportation Needs (09/18/2023)   PRAPARE - Administrator, Civil Service (Medical): No    Lack of Transportation (Non-Medical): No  Physical Activity: Not on file  Stress: Not on file  Social Connections: Not on file  Intimate Partner Violence: Not At Risk (09/18/2023)   Humiliation, Afraid, Rape, and Kick questionnaire    Fear of Current or Ex-Partner: No    Emotionally Abused: No    Physically Abused: No    Sexually Abused: No    Family History:  Family History  Problem Relation Age of Onset   Heart disease Father    Hypertension Father    Diabetes Father    Kidney disease Father    Cancer Brother     Medications:   Current Outpatient Medications on File Prior to Visit  Medication Sig Dispense Refill    acetaminophen (TYLENOL) 500 MG tablet Take 1,000 mg by mouth every 8 (eight) hours as needed for mild pain.     ALPRAZolam (XANAX) 0.5 MG tablet Take 1 tablet (0.5 mg total) by mouth See admin instructions. Take 0.5 mg by mouth at bedtime and an additional 0.5 mg once a day as needed for anxiety 60 tablet 0   apixaban (ELIQUIS) 5 MG TABS tablet Take 1 tablet (5 mg total) by mouth 2 (two) times daily. 60 tablet 1   dexamethasone (DECADRON) 4 MG tablet Take 1 tablet (4 mg total) by mouth every 8 (eight) hours. 120 tablet 0   folic acid (FOLVITE) 1 MG tablet Take 1 tablet (1 mg total) by mouth daily. 30 tablet 2   HYDROcodone-acetaminophen (NORCO/VICODIN) 5-325 MG tablet Take 1 tablet by mouth every 6 (six) hours as needed.  21 tablet 0   ondansetron (ZOFRAN) 4 MG tablet Take 1 tablet (4 mg total) by mouth every 6 (six) hours as needed for nausea. 20 tablet 0   pantoprazole (PROTONIX) 40 MG tablet Take 1 tablet (40 mg total) by mouth 2 (two) times daily. 30 tablet 2   temazepam (RESTORIL) 15 MG capsule Take 1 capsule (15 mg total) by mouth at bedtime as needed for sleep. 30 capsule 0   [DISCONTINUED] bisoprolol-hydrochlorothiazide (ZIAC) 5-6.25 MG tablet Take  1 tablet  Daily  for BP 90 tablet 3   No current facility-administered medications on file prior to visit.    Allergies:   Allergies  Allergen Reactions   Biaxin [Clarithromycin] Nausea Only      OBJECTIVE:  Physical Exam  Vitals:   10/11/23 1012  BP: (!) 98/57  Pulse: 88  Weight: 151 lb (68.5 kg)  Height: 5\' 5"  (1.651 m)   Body mass index is 25.13 kg/m. No results found.  General: Frail very pleasant middle aged Caucasian female, seated, in no evident distress  Neurologic Exam Mental Status: Awake and fully alert. Fluent speech and language. Oriented to place and time. Recent and remote memory intact. Attention span, concentration and fund of knowledge appropriate. Mood and affect appropriate.  Cranial Nerves: Pupils  equal, briskly reactive to light. Extraocular movements full without nystagmus. Visual fields full to confrontation. Hearing intact. Facial sensation intact. Face, tongue, palate moves normally and symmetrically.  Motor: Normal bulk and tone. Normal strength in all tested extremity muscles except mild bilateral HF weakness Sensory.: intact to touch , pinprick , position and vibratory sensation.  Coordination: Rapid alternating movements normal in all extremities.  Gait and Station: deferred  Reflexes: 1+ and symmetric. Toes downgoing.         ASSESSMENT: Rebecca Ochoa is a 62 y.o. year old female with left parietal and left occipital cerebral micro bleeding on imaging on 09/22/2023 during hospitalization for sepsis with complaints of headache, unclear etiology, possibly in setting of Eliquis use (for recent DVT and PE) vs underlying metastasis (right lung cancer with liver and bone mets) vs late subacute infarct with petechial hemorrhagic conversion in setting of hypercoagulable state.      PLAN:  CMBs :  No neurological deficits, neuroexam intact, does have mild bilateral HF weakness but suspect more in setting of deconditioning, declines interest in PT at this time Repeat MRI brain w/wo contrast to rule out brain metastasis Discussed stroke prevention measures and importance of close PCP follow up for aggressive stroke risk factor management  DVT and PE:  Remains on Eliquis 5 mg twice daily managed by PCP/oncology  Lung cancer with mets: Followed by oncology, plans on starting chemo 12/13    No further recommendations from stroke/neurological standpoint and can follow up as needed at this time   CC:  [facility] provider: Dr. Pearlean Brownie PCP: [name], MD    I spent 50 minutes of face-to-face and non-face-to-face time with patient and husband.  This included previsit chart review, lab review, study review, order entry, electronic health record documentation, patient and husband  education and discussion regarding above diagnoses and treatment plan and answered all other questions to patient and husbands satisfaction  Ihor Austin, AGNP-BC  [facility] [address] [place], [place] [address]  Phone [phone] Fax [phone] Note: This document was prepared with digital dictation and possible smart phrase technology. Any transcriptional errors that result from this process are unintentional.

## 2023-10-12 ENCOUNTER — Telehealth: Payer: Self-pay | Admitting: Nurse Practitioner

## 2023-10-12 ENCOUNTER — Encounter: Payer: Self-pay | Admitting: Internal Medicine

## 2023-10-12 NOTE — Telephone Encounter (Signed)
I was confirming her CPE appointment and she cancelled. She has been in and out of the hospital multiple times. She did not want to reschedule either. She did not see a reason to come in since she gets blood drawn at the hospital.

## 2023-10-13 ENCOUNTER — Telehealth: Payer: Self-pay | Admitting: Physician Assistant

## 2023-10-13 ENCOUNTER — Inpatient Hospital Stay: Payer: No Typology Code available for payment source

## 2023-10-13 ENCOUNTER — Encounter: Payer: Self-pay | Admitting: Internal Medicine

## 2023-10-13 ENCOUNTER — Ambulatory Visit: Payer: No Typology Code available for payment source

## 2023-10-13 NOTE — Telephone Encounter (Signed)
She has incurable cancer so it is ok if she does not follow here

## 2023-10-13 NOTE — Progress Notes (Deleted)
Palliative Medicine [facility]  [phone] [phone]) [phone]   Name: Rebecca Ochoa Date: 10/13/2023 MRN: [id number]  DOB: 1961/09/05  Patient Care Team: [name], MD as PCP - General (Internal Medicine) Wyline Mood Alben Spittle, MD as PCP - Cardiology (Cardiology) Hanley Seamen Dustin Folks, MD as Referring Physician (Optometry) Ilda Mori, MD as Consulting Physician (Obstetrics and Gynecology) Arminda Resides, MD as Consulting Physician (Dermatology) Lytle Butte, RN as Registered Nurse [name], MD as Referring Physician (Internal Medicine) Pickenpack-Cousar, Arty Baumgartner, NP as Nurse Practitioner (Hospice and Palliative Medicine)    INTERVAL HISTORY: Rebecca Ochoa is a 62 y.o. female with oncologic medical history including non-small cell lung cancer (08/2023) and innumerable hypermetabolic pulmonary metastatic lesions in addition to liver and bone metastasis as well as abdominal celiac axis lymphadenopathy.  Palliative ask to see for symptom management and goals of care.   SOCIAL HISTORY:     reports that she has been smoking e-cigarettes. She has never used smokeless tobacco. She reports current alcohol use of about 3.0 standard drinks of alcohol per week. She reports that she does not use drugs.  ADVANCE DIRECTIVES:  None on file  CODE STATUS: Full code  PAST MEDICAL HISTORY: Past Medical History:  Diagnosis Date   Anemia    Asthma, mild intermittent, well-controlled    GERD (gastroesophageal reflux disease)    Hx of migraines    Hyperlipidemia    Hypertension    Vitamin D deficiency     ALLERGIES:  is allergic to biaxin [clarithromycin].  MEDICATIONS:  Current Outpatient Medications  Medication Sig Dispense Refill   acetaminophen (TYLENOL) 500 MG tablet Take 1,000 mg by mouth every 8 (eight) hours as needed for mild pain.     ALPRAZolam (XANAX) 0.5 MG tablet Take 1 tablet (0.5 mg total) by mouth 3 (three) times daily as  needed for anxiety. 60 tablet 0   apixaban (ELIQUIS) 5 MG TABS tablet Take 1 tablet (5 mg total) by mouth 2 (two) times daily. 60 tablet 1   dexamethasone (DECADRON) 4 MG tablet Take 1 tablet (4 mg total) by mouth every 8 (eight) hours. 120 tablet 0   folic acid (FOLVITE) 1 MG tablet Take 1 tablet (1 mg total) by mouth daily. 30 tablet 2   HYDROcodone-acetaminophen (NORCO/VICODIN) 5-325 MG tablet Take 1 tablet by mouth every 6 (six) hours as needed. 21 tablet 0   ondansetron (ZOFRAN) 4 MG tablet Take 1 tablet (4 mg total) by mouth every 6 (six) hours as needed for nausea. 20 tablet 0   pantoprazole (PROTONIX) 40 MG tablet Take 1 tablet (40 mg total) by mouth 2 (two) times daily. 30 tablet 2   temazepam (RESTORIL) 15 MG capsule Take 1 capsule (15 mg total) by mouth at bedtime as needed for sleep. 30 capsule 0   No current facility-administered medications for this visit.    VITAL SIGNS: There were no vitals taken for this visit. There were no vitals filed for this visit.  Estimated body mass index is 25.13 kg/m as calculated from the following:   Height as of 10/11/23: 5\' 5"  (1.651 m).   Weight as of 10/11/23: 151 lb (68.5 kg).   PERFORMANCE STATUS (ECOG) : 1 - Symptomatic but completely ambulatory   Physical Exam General: NAD Cardiovascular: regular rate and rhythm Pulmonary: clear ant fields Abdomen: soft, nontender, + bowel sounds Extremities: no edema, no joint deformities Skin: no rashes Neurological:   IMPRESSION: ***  We discussed Her current illness  and what it means in the larger context of Her on-going co-morbidities. Natural disease trajectory and expectations were discussed.  I discussed the importance of continued conversation with family and their medical providers regarding overall plan of care and treatment options, ensuring decisions are within the context of the patients values and GOCs.  PLAN:  Established therapeutic relationship. Education provided on  palliative's role in collaboration with their Oncology/Radiation team.  Insomnia Current medication (Restoril 15mg ) ineffective. Patient reports only 2-3 hours of solid sleep per night. -Increase Restoril to 30mg  at bedtime. -If ineffective, consider switching to a different medication or adding melatonin.  Cancer-related fatigue and shortness of breath Patient reports significant fatigue and shortness of breath, limiting mobility and daily activities. -Continue current cancer treatment. -Monitor patient's response to treatment, specifically looking for improvements in stamina and breathing. -Consider physical therapy if no improvement after 1-2 treatment cycles.  Diarrhea Likely related to Tag[place]. Currently managed with Imodium. -Continue Imodium as needed.  Nutrition Altered taste and reduced appetite. Patient attempting to eat three meals a day, mostly prepared by partner. -Consult with dietician for nutrition advice and meal planning.  General Health Maintenance: -Continue current medications Eliquis, Decadron (tapering), folic acid, Xanax as needed, Protonix twice daily, Zofran as needed. -First cancer infusion scheduled for 13th December. Follow-up appointment on 20th December to assess response to treatment. -Patient knows to contact office as needed.   Patient expressed understanding and was in agreement with this plan. She also understands that She can call the clinic at any time with any questions, concerns, or complaints.   Any controlled substances utilized were prescribed in the context of palliative care. PDMP has been reviewed.    Visit consisted of counseling and education dealing with the complex and emotionally intense issues of symptom management and palliative care in the setting of serious and potentially life-threatening illness.  Willette Alma, AGPCNP-BC  Palliative Medicine Team/[facility]  *Please note that this is a verbal  dictation therefore any spelling or grammatical errors are due to the "Dragon [employer] One" system interpretation.

## 2023-10-13 NOTE — Telephone Encounter (Signed)
I received notification that the patient's chemotherapy is still under MD review with her insurance company.  Therefore, we will have to cancel her chemotherapy today.  I will help facilitate getting this rescheduled once I know when this is approved.

## 2023-10-14 ENCOUNTER — Other Ambulatory Visit: Payer: Self-pay

## 2023-10-14 NOTE — Progress Notes (Signed)
I agree with the above plan 

## 2023-10-16 ENCOUNTER — Encounter: Payer: No Typology Code available for payment source | Admitting: Nurse Practitioner

## 2023-10-16 ENCOUNTER — Other Ambulatory Visit: Payer: Self-pay | Admitting: Nurse Practitioner

## 2023-10-16 ENCOUNTER — Telehealth: Payer: Self-pay | Admitting: Adult Health

## 2023-10-16 DIAGNOSIS — F419 Anxiety disorder, unspecified: Secondary | ICD-10-CM

## 2023-10-16 NOTE — Telephone Encounter (Signed)
sent to GI they obtain [employer] auth [id number] 

## 2023-10-16 NOTE — Telephone Encounter (Signed)
Please call pharmacyto see if they got my last refill.   I sent a refill on 10/11/23

## 2023-10-17 ENCOUNTER — Emergency Department (HOSPITAL_COMMUNITY): Payer: No Typology Code available for payment source

## 2023-10-17 ENCOUNTER — Other Ambulatory Visit: Payer: Self-pay

## 2023-10-17 ENCOUNTER — Inpatient Hospital Stay (HOSPITAL_COMMUNITY)
Admission: EM | Admit: 2023-10-17 | Discharge: 2023-10-23 | DRG: 177 | Disposition: A | Discharge status: Home / Self Care | Payer: No Typology Code available for payment source | Attending: Internal Medicine | Admitting: Internal Medicine

## 2023-10-17 ENCOUNTER — Encounter (HOSPITAL_COMMUNITY): Payer: Self-pay

## 2023-10-17 ENCOUNTER — Other Ambulatory Visit: Payer: No Typology Code available for payment source

## 2023-10-17 DIAGNOSIS — Z7901 Long term (current) use of anticoagulants: Secondary | ICD-10-CM | POA: Diagnosis not present

## 2023-10-17 DIAGNOSIS — J189 Pneumonia, unspecified organism: Secondary | ICD-10-CM | POA: Diagnosis present

## 2023-10-17 DIAGNOSIS — E559 Vitamin D deficiency, unspecified: Secondary | ICD-10-CM | POA: Diagnosis present

## 2023-10-17 DIAGNOSIS — I10 Essential (primary) hypertension: Secondary | ICD-10-CM | POA: Diagnosis present

## 2023-10-17 DIAGNOSIS — Z841 Family history of disorders of kidney and ureter: Secondary | ICD-10-CM

## 2023-10-17 DIAGNOSIS — Z86711 Personal history of pulmonary embolism: Secondary | ICD-10-CM

## 2023-10-17 DIAGNOSIS — J45909 Unspecified asthma, uncomplicated: Secondary | ICD-10-CM | POA: Diagnosis present

## 2023-10-17 DIAGNOSIS — R590 Localized enlarged lymph nodes: Secondary | ICD-10-CM | POA: Diagnosis present

## 2023-10-17 DIAGNOSIS — J9601 Acute respiratory failure with hypoxia: Secondary | ICD-10-CM | POA: Diagnosis present

## 2023-10-17 DIAGNOSIS — R042 Hemoptysis: Secondary | ICD-10-CM | POA: Diagnosis present

## 2023-10-17 DIAGNOSIS — G936 Cerebral edema: Secondary | ICD-10-CM | POA: Diagnosis present

## 2023-10-17 DIAGNOSIS — C7800 Secondary malignant neoplasm of unspecified lung: Secondary | ICD-10-CM | POA: Diagnosis present

## 2023-10-17 DIAGNOSIS — F419 Anxiety disorder, unspecified: Secondary | ICD-10-CM | POA: Diagnosis present

## 2023-10-17 DIAGNOSIS — E785 Hyperlipidemia, unspecified: Secondary | ICD-10-CM | POA: Diagnosis present

## 2023-10-17 DIAGNOSIS — F17290 Nicotine dependence, other tobacco product, uncomplicated: Secondary | ICD-10-CM | POA: Diagnosis present

## 2023-10-17 DIAGNOSIS — R0602 Shortness of breath: Principal | ICD-10-CM | POA: Insufficient documentation

## 2023-10-17 DIAGNOSIS — I700 Atherosclerosis of aorta: Secondary | ICD-10-CM | POA: Diagnosis present

## 2023-10-17 DIAGNOSIS — C7951 Secondary malignant neoplasm of bone: Secondary | ICD-10-CM | POA: Diagnosis present

## 2023-10-17 DIAGNOSIS — J690 Pneumonitis due to inhalation of food and vomit: Principal | ICD-10-CM | POA: Diagnosis present

## 2023-10-17 DIAGNOSIS — Z8249 Family history of ischemic heart disease and other diseases of the circulatory system: Secondary | ICD-10-CM

## 2023-10-17 DIAGNOSIS — C3411 Malignant neoplasm of upper lobe, right bronchus or lung: Secondary | ICD-10-CM | POA: Diagnosis present

## 2023-10-17 DIAGNOSIS — C7889 Secondary malignant neoplasm of other digestive organs: Secondary | ICD-10-CM | POA: Diagnosis present

## 2023-10-17 DIAGNOSIS — Z1152 Encounter for screening for COVID-19: Secondary | ICD-10-CM | POA: Diagnosis not present

## 2023-10-17 DIAGNOSIS — R000 Tachycardia, unspecified: Secondary | ICD-10-CM | POA: Diagnosis present

## 2023-10-17 DIAGNOSIS — D6959 Other secondary thrombocytopenia: Secondary | ICD-10-CM | POA: Diagnosis present

## 2023-10-17 DIAGNOSIS — M7989 Other specified soft tissue disorders: Secondary | ICD-10-CM | POA: Diagnosis not present

## 2023-10-17 DIAGNOSIS — J4521 Mild intermittent asthma with (acute) exacerbation: Secondary | ICD-10-CM | POA: Diagnosis present

## 2023-10-17 DIAGNOSIS — D630 Anemia in neoplastic disease: Secondary | ICD-10-CM | POA: Diagnosis present

## 2023-10-17 DIAGNOSIS — C3490 Malignant neoplasm of unspecified part of unspecified bronchus or lung: Secondary | ICD-10-CM

## 2023-10-17 DIAGNOSIS — C787 Secondary malignant neoplasm of liver and intrahepatic bile duct: Secondary | ICD-10-CM | POA: Diagnosis present

## 2023-10-17 DIAGNOSIS — J359 Chronic disease of tonsils and adenoids, unspecified: Secondary | ICD-10-CM | POA: Diagnosis present

## 2023-10-17 DIAGNOSIS — D849 Immunodeficiency, unspecified: Secondary | ICD-10-CM | POA: Diagnosis present

## 2023-10-17 DIAGNOSIS — J90 Pleural effusion, not elsewhere classified: Secondary | ICD-10-CM | POA: Diagnosis present

## 2023-10-17 DIAGNOSIS — E876 Hypokalemia: Secondary | ICD-10-CM | POA: Diagnosis present

## 2023-10-17 DIAGNOSIS — K521 Toxic gastroenteritis and colitis: Secondary | ICD-10-CM | POA: Diagnosis present

## 2023-10-17 DIAGNOSIS — J9811 Atelectasis: Secondary | ICD-10-CM | POA: Diagnosis present

## 2023-10-17 DIAGNOSIS — K219 Gastro-esophageal reflux disease without esophagitis: Secondary | ICD-10-CM | POA: Diagnosis present

## 2023-10-17 DIAGNOSIS — J9600 Acute respiratory failure, unspecified whether with hypoxia or hypercapnia: Secondary | ICD-10-CM | POA: Diagnosis present

## 2023-10-17 DIAGNOSIS — J358 Other chronic diseases of tonsils and adenoids: Secondary | ICD-10-CM

## 2023-10-17 DIAGNOSIS — T451X5A Adverse effect of antineoplastic and immunosuppressive drugs, initial encounter: Secondary | ICD-10-CM | POA: Diagnosis present

## 2023-10-17 DIAGNOSIS — I2699 Other pulmonary embolism without acute cor pulmonale: Secondary | ICD-10-CM | POA: Diagnosis present

## 2023-10-17 DIAGNOSIS — G43909 Migraine, unspecified, not intractable, without status migrainosus: Secondary | ICD-10-CM | POA: Diagnosis present

## 2023-10-17 DIAGNOSIS — C778 Secondary and unspecified malignant neoplasm of lymph nodes of multiple regions: Secondary | ICD-10-CM | POA: Diagnosis not present

## 2023-10-17 DIAGNOSIS — R5381 Other malaise: Secondary | ICD-10-CM | POA: Diagnosis present

## 2023-10-17 DIAGNOSIS — Z833 Family history of diabetes mellitus: Secondary | ICD-10-CM

## 2023-10-17 LAB — HEPARIN LEVEL (UNFRACTIONATED): Heparin Unfractionated: >1.1 [IU]/mL — ABNORMAL HIGH (ref 0.30–0.70)

## 2023-10-17 LAB — I-STAT CHEM 8, ED
BUN: 15 mg/dL (ref 8–23)
Calcium, Ion: 1.16 mmol/L (ref 1.15–1.40)
Chloride: 102 mmol/L (ref 98–111)
Creatinine, Ser: 0.6 mg/dL (ref 0.44–1.00)
Glucose, Bld: 94 mg/dL (ref 70–99)
HCT: 29 % — ABNORMAL LOW (ref 36.0–46.0)
Hemoglobin: 9.9 g/dL — ABNORMAL LOW (ref 12.0–15.0)
Potassium: 3.2 mmol/L — ABNORMAL LOW (ref 3.5–5.1)
Sodium: 138 mmol/L (ref 135–145)
TCO2: 27 mmol/L (ref 22–32)

## 2023-10-17 LAB — COMPREHENSIVE METABOLIC PANEL
ALT: 106 U/L — ABNORMAL HIGH (ref 0–44)
AST: 49 U/L — ABNORMAL HIGH (ref 15–41)
Albumin: 3 g/dL — ABNORMAL LOW (ref 3.5–5.0)
Alkaline Phosphatase: 117 U/L (ref 38–126)
Anion gap: 9 (ref 5–15)
BUN: 17 mg/dL (ref 8–23)
CO2: 27 mmol/L (ref 22–32)
Calcium: 8.7 mg/dL — ABNORMAL LOW (ref 8.9–10.3)
Chloride: 102 mmol/L (ref 98–111)
Creatinine, Ser: 0.56 mg/dL (ref 0.44–1.00)
GFR, Estimated: >60 mL/min (ref >60)
Glucose, Bld: 98 mg/dL (ref 70–99)
Potassium: 3.2 mmol/L — ABNORMAL LOW (ref 3.5–5.1)
Sodium: 138 mmol/L (ref 135–145)
Total Bilirubin: 1.4 mg/dL — ABNORMAL HIGH (ref <1.2)
Total Protein: 5.8 g/dL — ABNORMAL LOW (ref 6.5–8.1)

## 2023-10-17 LAB — RESPIRATORY PANEL BY PCR

## 2023-10-17 LAB — BLOOD GAS, VENOUS
Acid-Base Excess: 5.4 mmol/L — ABNORMAL HIGH (ref 0.0–2.0)
Bicarbonate: 29.9 mmol/L — ABNORMAL HIGH (ref 20.0–28.0)
O2 Saturation: 53.8 %
Patient temperature: 37
pCO2, Ven: 42 mm[Hg] — ABNORMAL LOW (ref 44–60)
pH, Ven: 7.46 — ABNORMAL HIGH (ref 7.25–7.43)
pO2, Ven: 33 mm[Hg] (ref 32–45)

## 2023-10-17 LAB — CBC WITH DIFFERENTIAL/PLATELET
Abs Immature Granulocytes: 0.14 10*3/uL — ABNORMAL HIGH (ref 0.00–0.07)
Basophils Absolute: 0 10*3/uL (ref 0.0–0.1)
Basophils Relative: 0 %
Eosinophils Absolute: 0 10*3/uL (ref 0.0–0.5)
Eosinophils Relative: 0 %
HCT: 31.1 % — ABNORMAL LOW (ref 36.0–46.0)
Hemoglobin: 9.5 g/dL — ABNORMAL LOW (ref 12.0–15.0)
Immature Granulocytes: 1 %
Lymphocytes Relative: 2 %
Lymphs Abs: 0.3 10*3/uL — ABNORMAL LOW (ref 0.7–4.0)
MCH: 28.1 pg (ref 26.0–34.0)
MCHC: 30.5 g/dL (ref 30.0–36.0)
MCV: 92 fL (ref 80.0–100.0)
Monocytes Absolute: 0.4 10*3/uL (ref 0.1–1.0)
Monocytes Relative: 4 %
Neutro Abs: 11.3 10*3/uL — ABNORMAL HIGH (ref 1.7–7.7)
Neutrophils Relative %: 93 %
Platelets: 94 10*3/uL — ABNORMAL LOW (ref 150–400)
RBC: 3.38 MIL/uL — ABNORMAL LOW (ref 3.87–5.11)
RDW: 18.7 % — ABNORMAL HIGH (ref 11.5–15.5)
WBC: 12.2 10*3/uL — ABNORMAL HIGH (ref 4.0–10.5)
nRBC: 0.5 % — ABNORMAL HIGH (ref 0.0–0.2)

## 2023-10-17 LAB — TROPONIN I (HIGH SENSITIVITY)
Troponin I (High Sensitivity): 4 ng/L (ref <18)
Troponin I (High Sensitivity): 4 ng/L (ref <18)

## 2023-10-17 LAB — MAGNESIUM: Magnesium: 2.4 mg/dL (ref 1.7–2.4)

## 2023-10-17 LAB — APTT: aPTT: <22 s — ABNORMAL LOW (ref 24–36)

## 2023-10-17 LAB — RESP PANEL BY RT-PCR (RSV, FLU A&B, COVID)  RVPGX2
Influenza A by PCR: NEGATIVE
Influenza B by PCR: NEGATIVE
Resp Syncytial Virus by PCR: NEGATIVE
SARS Coronavirus 2 by RT PCR: NEGATIVE

## 2023-10-17 LAB — BRAIN NATRIURETIC PEPTIDE: B Natriuretic Peptide: 84.8 pg/mL (ref 0.0–100.0)

## 2023-10-17 MED ORDER — DOXYCYCLINE HYCLATE 100 MG PO TABS
100.0000 mg | ORAL_TABLET | Freq: Once | ORAL | Status: AC
  Administered 2023-10-17: 100 mg via ORAL
  Filled 2023-10-17: qty 1

## 2023-10-17 MED ORDER — DEXTROSE 5 % IV SOLN
100.0000 mg | Freq: Two times a day (BID) | INTRAVENOUS | Status: DC
  Administered 2023-10-17 – 2023-10-18 (×3): 100 mg via INTRAVENOUS
  Filled 2023-10-17 (×4): qty 100

## 2023-10-17 MED ORDER — ONDANSETRON HCL 4 MG/2ML IJ SOLN: 4.0000 mg | Freq: Four times a day (QID) | INTRAMUSCULAR | Status: DC | PRN

## 2023-10-17 MED ORDER — ALBUTEROL SULFATE (2.5 MG/3ML) 0.083% IN NEBU: 2.5000 mg | INHALATION_SOLUTION | RESPIRATORY_TRACT | Status: DC | PRN

## 2023-10-17 MED ORDER — PIPERACILLIN-TAZOBACTAM 3.375 G IVPB 30 MIN: 3.3750 g | Freq: Three times a day (TID) | INTRAVENOUS | Status: DC

## 2023-10-17 MED ORDER — FOLIC ACID 1 MG PO TABS
1.0000 mg | ORAL_TABLET | Freq: Every day | ORAL | Status: DC
  Administered 2023-10-18 – 2023-10-23 (×6): 1 mg via ORAL
  Filled 2023-10-17 (×6): qty 1

## 2023-10-17 MED ORDER — IOHEXOL 350 MG/ML SOLN
75.0000 mL | Freq: Once | INTRAVENOUS | Status: AC | PRN
  Administered 2023-10-17: 75 mL via INTRAVENOUS

## 2023-10-17 MED ORDER — ACETAMINOPHEN 650 MG RE SUPP: 650.0000 mg | Freq: Four times a day (QID) | RECTAL | Status: DC | PRN

## 2023-10-17 MED ORDER — LORAZEPAM 2 MG/ML IJ SOLN
1.0000 mg | Freq: Once | INTRAMUSCULAR | Status: AC
  Administered 2023-10-17: 1 mg via INTRAVENOUS
  Filled 2023-10-17: qty 1

## 2023-10-17 MED ORDER — ALPRAZOLAM 0.5 MG PO TABS
0.5000 mg | ORAL_TABLET | Freq: Three times a day (TID) | ORAL | Status: DC | PRN
  Administered 2023-10-18 – 2023-10-23 (×8): 0.5 mg via ORAL
  Filled 2023-10-17 (×8): qty 1

## 2023-10-17 MED ORDER — DEXAMETHASONE 4 MG PO TABS: 2.0000 mg | ORAL_TABLET | Freq: Every day | ORAL | Status: DC

## 2023-10-17 MED ORDER — DEXAMETHASONE 2 MG PO TABS: 2.0000 mg | ORAL_TABLET | ORAL | Status: DC

## 2023-10-17 MED ORDER — SODIUM CHLORIDE 0.9 % IV BOLUS
1000.0000 mL | Freq: Once | INTRAVENOUS | Status: AC
  Administered 2023-10-17: 1000 mL via INTRAVENOUS

## 2023-10-17 MED ORDER — PIPERACILLIN-TAZOBACTAM 3.375 G IVPB
3.3750 g | Freq: Three times a day (TID) | INTRAVENOUS | Status: DC
  Administered 2023-10-17 – 2023-10-19 (×6): 3.375 g via INTRAVENOUS
  Filled 2023-10-17 (×6): qty 50

## 2023-10-17 MED ORDER — DEXAMETHASONE 4 MG PO TABS
4.0000 mg | ORAL_TABLET | Freq: Two times a day (BID) | ORAL | Status: AC
  Administered 2023-10-17 – 2023-10-22 (×11): 4 mg via ORAL
  Filled 2023-10-17: qty 1
  Filled 2023-10-17 (×4): qty 2
  Filled 2023-10-17 (×2): qty 1
  Filled 2023-10-17 (×2): qty 2
  Filled 2023-10-17: qty 1
  Filled 2023-10-17: qty 2

## 2023-10-17 MED ORDER — CHLORHEXIDINE GLUCONATE CLOTH 2 % EX PADS
6.0000 | MEDICATED_PAD | Freq: Every day | CUTANEOUS | Status: DC
  Administered 2023-10-18 – 2023-10-22 (×5): 6 via TOPICAL

## 2023-10-17 MED ORDER — POTASSIUM CHLORIDE 20 MEQ PO PACK
40.0000 meq | PACK | Freq: Once | ORAL | Status: AC
  Administered 2023-10-17: 40 meq via ORAL
  Filled 2023-10-17 (×2): qty 2

## 2023-10-17 MED ORDER — PANTOPRAZOLE SODIUM 40 MG PO TBEC
40.0000 mg | DELAYED_RELEASE_TABLET | Freq: Two times a day (BID) | ORAL | Status: DC
Start: 2023-10-17 — End: 2023-10-23
  Administered 2023-10-17 – 2023-10-23 (×12): 40 mg via ORAL
  Filled 2023-10-17 (×12): qty 1

## 2023-10-17 MED ORDER — DEXAMETHASONE 4 MG PO TABS
4.0000 mg | ORAL_TABLET | Freq: Every day | ORAL | Status: DC
  Administered 2023-10-23: 4 mg via ORAL
  Filled 2023-10-17: qty 1

## 2023-10-17 MED ORDER — SODIUM CHLORIDE 0.9 % IV SOLN
1.0000 g | Freq: Once | INTRAVENOUS | Status: AC
  Administered 2023-10-17: 1 g via INTRAVENOUS
  Filled 2023-10-17: qty 10

## 2023-10-17 MED ORDER — TEMAZEPAM 15 MG PO CAPS
15.0000 mg | ORAL_CAPSULE | Freq: Every evening | ORAL | Status: DC | PRN
  Administered 2023-10-18 – 2023-10-22 (×6): 15 mg via ORAL
  Filled 2023-10-17 (×7): qty 1

## 2023-10-17 MED ORDER — SODIUM CHLORIDE 0.9 % IV SOLN: 1.0000 g | INTRAVENOUS | Status: DC

## 2023-10-17 MED ORDER — ONDANSETRON HCL 4 MG PO TABS: 4.0000 mg | ORAL_TABLET | Freq: Four times a day (QID) | ORAL | Status: DC | PRN

## 2023-10-17 MED ORDER — HEPARIN (PORCINE) 25000 UT/250ML-% IV SOLN
1250.0000 [IU]/h | INTRAVENOUS | Status: DC
  Filled 2023-10-17: qty 250

## 2023-10-17 MED ORDER — ACETAMINOPHEN 325 MG PO TABS: 650.0000 mg | ORAL_TABLET | Freq: Four times a day (QID) | ORAL | Status: DC | PRN

## 2023-10-17 NOTE — ED Notes (Signed)
Patient up to bedside commode. Peri care done.

## 2023-10-17 NOTE — Progress Notes (Signed)
Pt seen, found on HHFNC 20L 35%, HR85, RR27, spo2 99%.  Pt placed on 7L salter / hfnc per CCM request in attempts to wean off of hhfnc.  RN aware.  HR79, RR23-27, spo2 100%

## 2023-10-17 NOTE — ED Triage Notes (Signed)
Patient BIB EMS for SOB and Chest pain x 1 day. Increased SOB with exertion. Patient is 94% on RA. Does not wear oxygen.   Hx of lung cancer. On eliquis for PE.

## 2023-10-17 NOTE — H&P (Signed)
History and Physical    Patient: Rebecca Ochoa GGY:[id number] DOB: November 11, 1960 DOA: 10/17/2023 DOS: the patient was seen and examined on 10/17/2023 PCP: [name], MD  Patient coming from: Home  Chief Complaint:  Chief Complaint  Patient presents with   Shortness of Breath   HPI: Rebecca Ochoa is a 62 y.o. female with medical history significant of anemia, mild intermittent asthma, GERD, migraine headaches, hyperlipidemia, hypertension, vitamin D deficiency, history of pulmonary embolism on rivaroxaban, lung cancer undergoing palliative radiotherapy and oral chemotherapy, but has not started chemotherapy infusions due to insurance issues who presented to the emergency department via EMS with dyspnea and pleuritic chest pain for the past day.  She has also been having hemoptysis. She was placed on high flow nasal cannula.  No travel history or sick contacts to her knowledge.  She denied fever, chills, but feels very tired.  No palpitations, diaphoresis, PND, orthopnea or pitting edema of the lower extremities.  No abdominal pain,  emesis, diarrhea, constipation, melena or hematochezia. No flank pain, dysuria, frequency or hematuria. No polyuria, polydipsia, polyphagia or blurred vision.   Lab work: Venous blood gas showed a pH of 7.46, pCO2 of 42 and pO2 of 33 mmHg.  Bicarbonate 29.9 and acid-base excess 5.4 mmol/L.  Troponin x 2 and BNP normal.  Negative coronavirus, influenza and RSV PCR.  CBC showed a white count 12.2 with 93% neutrophils, hemoglobin 9.5 g/dL and platelets 94.  CMP showed a potassium of 3.2 mmol/L, the rest of the electrolytes are normal after calcium correction.  Normal glucose and renal function.  Total protein 5.8 and albumin 3.0 g/dL.  AST was 49 and ALT 106 units/L.  Total bilirubin 1.4 mg/dL.  Imaging: Portable 1 view chest radiograph showing a new left retrocardiac opacity.  Interval decrease in the multiple lobular opacities throughout bilateral lungs, suggesting  interval improvement of the metastasis.  CTA chest with no embolism to the segmental pulmonary artery level.  Interval decrease in multiple bilateral lung nodules as well as hilar/mediastinal lymphadenopathy.  There are new groundglass changes surrounding multiple lung nodules, which may represent posttreatment changes.  There is new small to moderate left pleural effusion with associated compressive atelectatic changes.  No right pleural effusion.  There are ill-defined hypoattenuating masses in the liver and spleen, highly concerning for metastasis.  Further imaging with dedicated contrast-enhanced MRI abdomen recommended.  Aortic atherosclerosis.   ED course: Initial vital signs were temperature 98.2 F, pulse 75, respiration 25, BP 154/95 mmHg O2 sat 94% on room air.  The patient received ceftriaxone 1 g IVPB, doxycycline 100 mg p.o., lorazepam 1 mg IVP and KCl 40 mEq p.o. x 1 dose.  Review of Systems: As mentioned in the history of present illness. All other systems reviewed and are negative. Past Medical History:  Diagnosis Date   Anemia    Asthma, mild intermittent, well-controlled    GERD (gastroesophageal reflux disease)    Hx of migraines    Hyperlipidemia    Hypertension    Vitamin D deficiency    Past Surgical History:  Procedure Laterality Date   BREAST SURGERY     reduction   BRONCHIAL BIOPSY  08/08/2023   Procedure: BRONCHIAL BIOPSIES;  Surgeon: Leslye Peer, MD;  Location[facility] ENDOSCOPY;  Service: Pulmonary;;   BRONCHIAL BRUSHINGS  08/08/2023   Procedure: BRONCHIAL BRUSHINGS;  Surgeon: Leslye Peer, MD;  Location: [facility] ENDOSCOPY;  Service: Pulmonary;;   BRONCHIAL NEEDLE ASPIRATION BIOPSY  08/08/2023   Procedure: BRONCHIAL NEEDLE ASPIRATION  BIOPSIES;  Surgeon: Leslye Peer, MD;  Location: [facility] ENDOSCOPY;  Service: Pulmonary;;   ENDOBRONCHIAL ULTRASOUND Bilateral 08/08/2023   Procedure: ENDOBRONCHIAL ULTRASOUND;  Surgeon: Leslye Peer, MD;  Location: [facility] ENDOSCOPY;  Service:  Pulmonary;  Laterality: Bilateral;   HEMOSTASIS CONTROL  08/08/2023   Procedure: HEMOSTASIS CONTROL;  Surgeon: Leslye Peer, MD;  Location: [facility] ENDOSCOPY;  Service: Pulmonary;;   IR IMAGING GUIDED PORT INSERTION  09/01/2023   REDUCTION MAMMAPLASTY     TUBAL LIGATION     Social History:  reports that she has been smoking e-cigarettes. She has never used smokeless tobacco. She reports current alcohol use of about 3.0 standard drinks of alcohol per week. She reports that she does not use drugs.  Allergies  Allergen Reactions   Biaxin [Clarithromycin] Nausea Only    Family History  Problem Relation Age of Onset   Heart disease Father    Hypertension Father    Diabetes Father    Kidney disease Father    Cancer Brother     Prior to Admission medications   Medication Sig Start Date End Date Taking? Authorizing Provider  acetaminophen (TYLENOL) 500 MG tablet Take 1,000 mg by mouth every 8 (eight) hours as needed for mild pain.    [provider]  ALPRAZolam Prudy Feeler) 0.5 MG tablet Take 1 tablet (0.5 mg total) by mouth 3 (three) times daily as needed for anxiety. 10/11/23   Raynelle Dick, NP  apixaban (ELIQUIS) 5 MG TABS tablet Take 1 tablet (5 mg total) by mouth 2 (two) times daily. 09/25/23   Jonah Blue, MD  dexamethasone (DECADRON) 4 MG tablet Take 1 tablet (4 mg total) by mouth every 8 (eight) hours. 09/25/23   Jonah Blue, MD  folic acid (FOLVITE) 1 MG tablet Take 1 tablet (1 mg total) by mouth daily. 09/12/23   Heilingoetter, Cassandra L, PA-C  HYDROcodone-acetaminophen (NORCO/VICODIN) 5-325 MG tablet Take 1 tablet by mouth every 6 (six) hours as needed. 09/07/23   Rhetta Mura, MD  ondansetron (ZOFRAN) 4 MG tablet Take 1 tablet (4 mg total) by mouth every 6 (six) hours as needed for nausea. 08/17/23   Rolly Salter, MD  pantoprazole (PROTONIX) 40 MG tablet Take 1 tablet (40 mg total) by mouth 2 (two) times daily. 09/07/23   Rhetta Mura, MD   temazepam (RESTORIL) 15 MG capsule Take 1 capsule (15 mg total) by mouth at bedtime as needed for sleep. 10/05/23   Si Gaul, MD  bisoprolol-hydrochlorothiazide [facility]) 5-6.25 MG tablet Take  1 tablet  Daily  for BP 06/06/22 07/11/22  [name], MD    Physical Exam: Vitals:   10/17/23 0909 10/17/23 1000  BP: (!) 154/95 137/82  Pulse: (!) 105 89  Resp: (!) 25 (!) 24  Temp: 98.2 F (36.8 C)   TempSrc: Oral   SpO2: 94% 92%  Weight: 68.5 kg   Height: 5\' 5"  (1.651 m)    Physical Exam Vitals and nursing note reviewed.  Constitutional:      General: She is awake. She is not in acute distress.    Appearance: She is well-developed. She is ill-appearing.  HENT:     Head: Normocephalic.     Nose: No rhinorrhea.     Mouth/Throat:     Mouth: Mucous membranes are moist.  Eyes:     General: No scleral icterus.    Pupils: Pupils are equal, round, and reactive to light.  Neck:     Vascular: No JVD.  Cardiovascular:  Rate and Rhythm: Normal rate and regular rhythm.     Heart sounds: S1 normal and S2 normal.  Pulmonary:     Effort: Tachypnea and respiratory distress present. No accessory muscle usage.     Breath sounds: Decreased breath sounds and wheezing present. No rhonchi or rales.  Abdominal:     General: Bowel sounds are normal. There is no distension.     Palpations: Abdomen is soft.     Tenderness: There is no abdominal tenderness. There is no guarding.  Musculoskeletal:     Cervical back: Neck supple.     Right lower leg: No edema.     Left lower leg: No edema.  Skin:    General: Skin is warm and dry.  Neurological:     General: No focal deficit present.     Mental Status: She is alert and oriented to person, place, and time.  Psychiatric:        Mood and Affect: Mood normal.        Behavior: Behavior normal. Behavior is cooperative.     Data Reviewed:  Results are pending, will review when available. 09/05/2023 echocardiogram report. IMPRESSIONS:    1. Left ventricular ejection fraction, by estimation, is 60 to 65%. The  left ventricle has normal function. The left ventricle has no regional  wall motion abnormalities. Indeterminate diastolic filling due to E-A  fusion.   2. Right ventricular systolic function is normal. The right ventricular  size is normal. Tricuspid regurgitation signal is inadequate for assessing  PA pressure.   3. The mitral valve was not well visualized. No evidence of mitral valve  regurgitation.   4. The aortic valve was not well visualized. Aortic valve regurgitation  is not visualized. No aortic stenosis is present.   5. The inferior vena cava is normal in size with greater than 50%  respiratory variability, suggesting right atrial pressure of 3 mmHg.   EKG: Vent. 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 Also has had a recent pulmonary embolus. Admit to SDU/inpatient. Continue supplemental oxygen. Scheduled and as needed bronchodilators. Continue doxycycline 100 mg IVPB every 12 hours. Switch ceftriaxone to Zosyn 3.375 g every 8 hours. Check strep pneumoniae urinary antigen. Check sputum Gram stain, culture and sensitivity. Follow-up blood culture and sensitivity. Follow-up CBC and chemistry in the morning. PCCM consult appreciated.  Active Problems:   Hyperlipidemia Currently not on therapy. Follow-up with PCP.    Essential hypertension Not on antihypertensives at the moment. Monitor blood pressure. Antihypertensives as needed.    Pulmonary embolism (HCC) Was on rivaroxaban. She has been switched to  heparin.    Advance Care Planning:   Code Status: Full Code   Consults: PCCM Cyril Mourning, MD)  Family Communication:   Severity of Illness: The appropriate patient status for this patient is INPATIENT. Inpatient status is judged to be reasonable and necessary in order to provide the required intensity of service to ensure the patient's safety. The patient's presenting symptoms, physical exam findings, and initial radiographic and laboratory data in the context of their chronic comorbidities is felt to place them at high risk for  further clinical deterioration. Furthermore, it is not anticipated that the patient will be medically stable for discharge from the hospital within 2 midnights of admission.   * I certify that at the point of admission it is my clinical judgment that the patient will require inpatient hospital care spanning beyond 2 midnights from the point of admission due to high intensity of service, high risk for further deterioration and high frequency of surveillance required.*  Author: Bobette Mo, MD 10/17/2023 12:53 PM  For on call review [email or web address].   This document was prepared using Dragon voice recognition software and may contain some unintended transcription errors.

## 2023-10-17 NOTE — Consult Note (Signed)
NAME:  Rebecca Ochoa, MRN:  [id number], DOB:  December 10, 1960, LOS: 0 ADMISSION DATE:  10/17/2023, CONSULTATION DATE: 10/17/23  REFERRING MD: Robb Matar CHIEF COMPLAINT:  Shortness of Breath   History of Present Illness:  Pt is a 62 yr old female with a significant past medical hx of metastatic lung cancer (diagnosed 2 months ago), recent PE (6 weeks ago) on eliquis, anemia, asthma, HTN, and GERD. Patient also had a recent hospitalization on 09/18/23 for fever, headache and leukocytosis where she was treated with antibiotics but MRI of head revealed left parietal microhemorrhages possibly related to metastatic disease vs subacute infarct with hemorrhagic conversion vs eliquis use. Patient followed up with Neurology outpatient on 12/11 in regards to brain bleed and per note seemed to be in usual state of health but with shortness of breath with exertion and deconditioning attributed to metastatic cancer. Patient presented to [facility] by EMS for worsening shortness of breath at rest but mostly with exertion. Patient's O2 sats were 90-94% on RA but with tachypnea and labored breathing. Patient was ultimately placed on BIPAP for WOB but patient could not tolerate due to anxiety and was placed on Heated HFNC instead.   Initial ED work up revealed, chest x-ray showing some decrease in lobar opacities indicating interval improvement of metastases, with left lower lobe atelectasis vs consolidation with pleural effusion, CT angio showed no PE, small left pleural effusion with atelectasis, and evidence of metastatic disease.  Negative for flu, covid, RSV PCR, WBC 12.2, hgb 9.5, and platelets 94, Trops and BNP within normal range. [facility] called to admit due dyspnea with PCCM consult to assist with increased WOB/hypoxia.   Upon assessment, patient that shortness of breath increased yesterday with some coughing evening on 12/16. Patient endorses that shortness of breath increases with exertion. Patient denies any recent sick contacts,  fevers, chills, n/v, hematemesis, hematochezia. Patient began coughing up dark, blood tinged sputum in ED. Patient reports that she is currently taking Tagrisso po daily and oral steroids for brain swelling but has not started systemic chemotherapy at this time. Since taking Tagrisso, patient has had issues with diarrhea but no other side effects.   Pertinent  Medical History   Past Medical History:  Diagnosis Date   Anemia    Asthma, mild intermittent, well-controlled    GERD (gastroesophageal reflux disease)    Hx of migraines    Hyperlipidemia    Hypertension    Vitamin D deficiency      Significant Hospital Events: Including procedures, antibiotic start and stop dates in addition to other pertinent events   12/17 Adm with [facility], PCCM consulted due to increase WOB  Interim History / Subjective:  SOB at rest but patients endorses improvement with HFNC  Objective   Blood pressure 117/85, pulse (!) 102, temperature 98.1 F (36.7 C), temperature source Oral, resp. rate (!) 28, height 5\' 5"  (1.651 m), weight 68.5 kg, SpO2 98%.    FiO2 (%):  [35 %] 35 %   Intake/Output Summary (Last 24 hours) at 10/17/2023 1651 Last data filed at 10/17/2023 1121 Gross per 24 hour  Intake 100 ml  Output --  Net 100 ml   Filed Weights   10/17/23 0909  Weight: 68.5 kg    Examination: General: chronically ill middle aged female, semi fowlers in bed  HENT: Normocephalic, teeth intact, dry Pink MM, HHFNC  Lungs: clear/diminished throughout lung bases, tachypnea, no respiratory distress  Cardiovascular: s1,s2, RRR, tachycardia, no JVD Abdomen: BS active Extremities: moves all extremities, no  edema  Neuro: AOx4 GU: intact   Resolved Hospital Problem list   RA   Assessment & Plan:  Dyspnea  Asthma hx (well controlled)  Metastatic lung cancer vs bronchitis vs anemia vs pna vs anxiety  Increased WOB with exertion Initial O2 Sats on RA 90-94%, but with increased WOB, tachypnea  Placed on  HHFNC to assist more increased WOB instead of hypoxia CT angio neg for PE, small left pleural effusion/left atelectasis BNP WNL, WBC slight elevation, Hgb 9.5, Covid/Flu/RSV neg  Low suspicion of infectious process at this time, leukocytosis more likely from steroid use, no wheezing on exam, Some coughing with small amounts dark, blood tinged sputum noted in ED P:  O2 Sat goal 92-95%, wean HHFNC to regular HFNC  Albuterol nebs prn Pulmonary hygiene, with deep breathing/cough exercises Order IS and flutter valve  Continue CAP coverage for now  Continue to trend CBC daily, transfuse for hgb < 7 Check RVP panel  1 Liter N.S Bolus  Hold eliquis transition heparin gtt   Best Practice per primary    Labs   CBC: Recent Labs  Lab 10/17/23 0931 10/17/23 0943  WBC 12.2*  --   NEUTROABS 11.3*  --   HGB 9.5* 9.9*  HCT 31.1* 29.0*  MCV 92.0  --   PLT 94*  --     Basic Metabolic Panel: Recent Labs  Lab 10/17/23 0931 10/17/23 0943  NA 138 138  K 3.2* 3.2*  CL 102 102  CO2 27  --   GLUCOSE 98 94  BUN 17 15  CREATININE 0.56 0.60  CALCIUM 8.7*  --   MG 2.4  --    GFR: Estimated Creatinine Clearance: 70.9 mL/min (by C-G formula based on SCr of 0.6 mg/dL). Recent Labs  Lab 10/17/23 0931  WBC 12.2*    Liver Function Tests: Recent Labs  Lab 10/17/23 0931  AST 49*  ALT 106*  ALKPHOS 117  BILITOT 1.4*  PROT 5.8*  ALBUMIN 3.0*   No results for input(s): "LIPASE", "AMYLASE" in the last 168 hours. No results for input(s): "AMMONIA" in the last 168 hours.  ABG    Component Value Date/Time   HCO3 29.9 (H) 10/17/2023 0943   TCO2 27 10/17/2023 0943   O2SAT 53.8 10/17/2023 0943     Coagulation Profile: No results for input(s): "INR", "PROTIME" in the last 168 hours.  Cardiac Enzymes: No results for input(s): "CKTOTAL", "CKMB", "CKMBINDEX", "TROPONINI" in the last 168 hours.  HbA1C: Hgb A1c MFr Bld  Date/Time Value Ref Range Status  09/24/2023 05:00 AM 5.5 4.8 -  5.6 % Final    Comment:    (NOTE) Pre diabetes:          5.7%-6.4%  Diabetes:              >6.4%  Glycemic control for   <7.0% adults with diabetes   10/12/2022 02:42 PM 5.8 (H) <5.7 % of total Hgb Final    Comment:    For someone without known diabetes, a hemoglobin  A1c value between 5.7% and 6.4% is consistent with prediabetes and should be confirmed with a  follow-up test. . For someone with known diabetes, a value <7% indicates that their diabetes is well controlled. A1c targets should be individualized based on duration of diabetes, age, comorbid conditions, and other considerations. . This assay result is consistent with an increased risk of diabetes. . Currently, no consensus exists regarding use of hemoglobin A1c for diagnosis of diabetes for children. Marland Kitchen  CBG: No results for input(s): "GLUCAP" in the last 168 hours.  Review of Systems:   See HPI above   Past Medical History:  She,  has a past medical history of Anemia, Asthma, mild intermittent, well-controlled, GERD (gastroesophageal reflux disease), migraines, Hyperlipidemia, Hypertension, and Vitamin D deficiency.   Surgical History:   Past Surgical History:  Procedure Laterality Date   BREAST SURGERY     reduction   BRONCHIAL BIOPSY  08/08/2023   Procedure: BRONCHIAL BIOPSIES;  Surgeon: Leslye Peer, MD;  Location[facility] ENDOSCOPY;  Service: Pulmonary;;   BRONCHIAL BRUSHINGS  08/08/2023   Procedure: BRONCHIAL BRUSHINGS;  Surgeon: Leslye Peer, MD;  Location: [facility] ENDOSCOPY;  Service: Pulmonary;;   BRONCHIAL NEEDLE ASPIRATION BIOPSY  08/08/2023   Procedure: BRONCHIAL NEEDLE ASPIRATION BIOPSIES;  Surgeon: Leslye Peer, MD;  Location: [facility] ENDOSCOPY;  Service: Pulmonary;;   ENDOBRONCHIAL ULTRASOUND Bilateral 08/08/2023   Procedure: ENDOBRONCHIAL ULTRASOUND;  Surgeon: Leslye Peer, MD;  Location: [facility] ENDOSCOPY;  Service: Pulmonary;  Laterality: Bilateral;   HEMOSTASIS CONTROL  08/08/2023   Procedure:  HEMOSTASIS CONTROL;  Surgeon: Leslye Peer, MD;  Location: [facility] ENDOSCOPY;  Service: Pulmonary;;   IR IMAGING GUIDED PORT INSERTION  09/01/2023   REDUCTION MAMMAPLASTY     TUBAL LIGATION       Social History:   reports that she has been smoking e-cigarettes. She has never used smokeless tobacco. She reports current alcohol use of about 3.0 standard drinks of alcohol per week. She reports that she does not use drugs.   Family History:  Her family history includes Cancer in her brother; Diabetes in her father; Heart disease in her father; Hypertension in her father; Kidney disease in her father.   Allergies Allergies  Allergen Reactions   Biaxin [Clarithromycin] Nausea Only     Home Medications  Prior to Admission medications   Medication Sig Start Date End Date Taking? Authorizing Provider  acetaminophen (TYLENOL) 500 MG tablet Take 1,000 mg by mouth every 8 (eight) hours as needed for mild pain.    [provider]  ALPRAZolam Prudy Feeler) 0.5 MG tablet Take 1 tablet (0.5 mg total) by mouth 3 (three) times daily as needed for anxiety. 10/11/23   Raynelle Dick, NP  apixaban (ELIQUIS) 5 MG TABS tablet Take 1 tablet (5 mg total) by mouth 2 (two) times daily. 09/25/23   Jonah Blue, MD  dexamethasone (DECADRON) 4 MG tablet Take 1 tablet (4 mg total) by mouth every 8 (eight) hours. 09/25/23   Jonah Blue, MD  folic acid (FOLVITE) 1 MG tablet Take 1 tablet (1 mg total) by mouth daily. 09/12/23   Heilingoetter, Cassandra L, PA-C  HYDROcodone-acetaminophen (NORCO/VICODIN) 5-325 MG tablet Take 1 tablet by mouth every 6 (six) hours as needed. 09/07/23   Rhetta Mura, MD  ondansetron (ZOFRAN) 4 MG tablet Take 1 tablet (4 mg total) by mouth every 6 (six) hours as needed for nausea. 08/17/23   Rolly Salter, MD  pantoprazole (PROTONIX) 40 MG tablet Take 1 tablet (40 mg total) by mouth 2 (two) times daily. 09/07/23   Rhetta Mura, MD  temazepam (RESTORIL) 15 MG capsule  Take 1 capsule (15 mg total) by mouth at bedtime as needed for sleep. 10/05/23   Si Gaul, MD  bisoprolol-hydrochlorothiazide [facility]) 5-6.25 MG tablet Take  1 tablet  Daily  for BP 06/06/22 07/11/22  [name], MD     Critical care time: 35 mins    [facility] Pulmonary &  Critical Care 10/17/2023, 6:14 PM  Please see [email or web address] for pager details.  From 7A-7P if no response, please call [phone]. After hours, please call ELink [phone].

## 2023-10-17 NOTE — Progress Notes (Signed)
Pt is refusing bipap at this time. She is wanting something for anxiety first. RN is aware.

## 2023-10-17 NOTE — Progress Notes (Signed)
PHARMACY - ANTICOAGULATION CONSULT NOTE  Pharmacy Consult for heparin  Indication: bridge therapy- apixaban for PE on hold  Allergies  Allergen Reactions   Biaxin [Clarithromycin] Nausea Only    Patient Measurements: Height: 5\' 5"  (165.1 cm) Weight: 68.5 kg (151 lb) IBW/kg (Calculated) : 57 Heparin Dosing Weight: 68.5 kg  Vital Signs: Temp: 98.4 F (36.9 C) (12/17 1700) Temp Source: Oral (12/17 1700) BP: 135/82 (12/17 1745) Pulse Rate: 107 (12/17 1745)  Labs: Recent Labs    10/17/23 0931 10/17/23 0943 10/17/23 1127  HGB 9.5* 9.9*  --   HCT 31.1* 29.0*  --   PLT 94*  --   --   CREATININE 0.56 0.60  --   TROPONINIHS 4  --  4    Estimated Creatinine Clearance: 70.9 mL/min (by C-G formula based on SCr of 0.6 mg/dL).   Medical History: Past Medical History:  Diagnosis Date   Anemia    Asthma, mild intermittent, well-controlled    GERD (gastroesophageal reflux disease)    Hx of migraines    Hyperlipidemia    Hypertension    Vitamin D deficiency     Medications:  Apixaban 5 mg po BID last dose 12/17 @ 8 am  Assessment: 62 yo F with PE dx by CTA 09/04/2023.  Pharmacy consulted to dose heparin for bridge therapy while apixaban on hold.  Pt presenting with 1 day of hemoptysis and shortness of breath.  Hg 9.9, PLT 94 Last dose of apixaban 5 bid 12/17 8 am  Goal of Therapy:  Heparin level 0.3-0.7 units/ml aPTT 66-102 seconds Monitor platelets by anticoagulation protocol: Yes   Plan:  Draw baseline aPTT & heparin level now> expect heparin level to be elevated due to apixaban taken PTA Start heparin drip 12 hours after last dose of apixaban at 1250 units/hr at 2000 tonight  Draw aPTT 8 hrs after started - 12/18 04 am Daily CBC, aPTT, heparin level  Herby Abraham, Pharm.D Use secure chat for questions 10/17/2023 6:39 PM

## 2023-10-17 NOTE — ED Notes (Signed)
Lab called to add on respiratory panel.  

## 2023-10-17 NOTE — ED Provider Notes (Signed)
[place] EMERGENCY DEPARTMENT AT [facility] Provider Note   CSN: [id number] Arrival date & time: 10/17/23  4098     History  Chief Complaint  Patient presents with   Shortness of Breath    Rebecca Ochoa is a 62 y.o. female.  HPI Patient presents for shortness of breath.  Medical history includes PE, HLD, anemia, asthma, HTN, metastatic lung cancer, GERD.  Lung cancer was diagnosed 2 months ago.  She has undergone palliative radiotherapy to hilar and mediastinal mass.  She is on a current oral chemotherapy daily.  She has not yet started chemotherapy infusions.  She was diagnosed with PE 6 weeks ago.  She has been on Eliquis.  She states that she is typically short of breath at baseline.  She does not wear supplemental oxygen.  Over the past 24 hours, she has had worsening shortness of breath and cough.  She has had associated exercise intolerance and diffuse anterior chest pain.  EMS noted SpO2 on room air of 90%.  They did place her on supplemental oxygen.  No other interventions were given prior to arrival.    Home Medications Prior to Admission medications   Medication Sig Start Date End Date Taking? Authorizing Provider  acetaminophen (TYLENOL) 500 MG tablet Take 1,000 mg by mouth every 8 (eight) hours as needed for mild pain.    [provider]  ALPRAZolam Prudy Feeler) 0.5 MG tablet Take 1 tablet (0.5 mg total) by mouth 3 (three) times daily as needed for anxiety. 10/11/23   Raynelle Dick, NP  apixaban (ELIQUIS) 5 MG TABS tablet Take 1 tablet (5 mg total) by mouth 2 (two) times daily. 09/25/23   Jonah Blue, MD  dexamethasone (DECADRON) 4 MG tablet Take 1 tablet (4 mg total) by mouth every 8 (eight) hours. 09/25/23   Jonah Blue, MD  folic acid (FOLVITE) 1 MG tablet Take 1 tablet (1 mg total) by mouth daily. 09/12/23   Heilingoetter, Cassandra L, PA-C  HYDROcodone-acetaminophen (NORCO/VICODIN) 5-325 MG tablet Take 1 tablet by mouth every 6 (six) hours  as needed. 09/07/23   Rhetta Mura, MD  ondansetron (ZOFRAN) 4 MG tablet Take 1 tablet (4 mg total) by mouth every 6 (six) hours as needed for nausea. 08/17/23   Rolly Salter, MD  pantoprazole (PROTONIX) 40 MG tablet Take 1 tablet (40 mg total) by mouth 2 (two) times daily. 09/07/23   Rhetta Mura, MD  temazepam (RESTORIL) 15 MG capsule Take 1 capsule (15 mg total) by mouth at bedtime as needed for sleep. 10/05/23   Si Gaul, MD  bisoprolol-hydrochlorothiazide [facility]) 5-6.25 MG tablet Take  1 tablet  Daily  for BP 06/06/22 07/11/22  [name], MD      Allergies    Biaxin [clarithromycin]    Review of Systems   Review of Systems  Constitutional:  Positive for activity change and fatigue.  Respiratory:  Positive for cough and shortness of breath.   Cardiovascular:  Positive for chest pain.  Musculoskeletal:  Positive for back pain and neck pain.  Neurological:  Positive for weakness (Generalized).  All other systems reviewed and are negative.   Physical Exam Updated Vital Signs BP 117/85   Pulse (!) 102   Temp 98.1 F (36.7 C) (Oral)   Resp (!) 28   Ht 5\' 5"  (1.651 m)   Wt 68.5 kg   SpO2 98%   BMI 25.13 kg/m  Physical Exam Vitals and nursing note reviewed.  Constitutional:  General: She is not in acute distress.    Appearance: Normal appearance. She is well-developed. She is ill-appearing. She is not toxic-appearing or diaphoretic.  HENT:     Head: Normocephalic and atraumatic.     Right Ear: External ear normal.     Left Ear: External ear normal.     Nose: Nose normal.     Mouth/Throat:     Mouth: Mucous membranes are moist.  Eyes:     Extraocular Movements: Extraocular movements intact.     Conjunctiva/sclera: Conjunctivae normal.  Cardiovascular:     Rate and Rhythm: Normal rate and regular rhythm.     Heart sounds: No murmur heard. Pulmonary:     Effort: Tachypnea and accessory muscle usage present. No respiratory distress.      Breath sounds: No stridor. Decreased breath sounds present. No wheezing or rhonchi.  Abdominal:     General: There is no distension.     Palpations: Abdomen is soft.     Tenderness: There is no abdominal tenderness.  Musculoskeletal:        General: No swelling or deformity. Normal range of motion.     Cervical back: Normal range of motion and neck supple.     Right lower leg: No edema.     Left lower leg: No edema.  Skin:    General: Skin is warm and dry.     Coloration: Skin is not jaundiced or pale.  Neurological:     General: No focal deficit present.     Mental Status: She is alert and oriented to person, place, and time.  Psychiatric:        Mood and Affect: Mood normal.        Behavior: Behavior normal.     ED Results / Procedures / Treatments   Labs (all labs ordered are listed, but only abnormal results are displayed) Labs Reviewed  COMPREHENSIVE METABOLIC PANEL - Abnormal; Notable for the following components:      Result Value   Potassium 3.2 (*)    Calcium 8.7 (*)    Total Protein 5.8 (*)    Albumin 3.0 (*)    AST 49 (*)    ALT 106 (*)    Total Bilirubin 1.4 (*)    All other components within normal limits  BLOOD GAS, VENOUS - Abnormal; Notable for the following components:   pH, Ven 7.46 (*)    pCO2, Ven 42 (*)    Bicarbonate 29.9 (*)    Acid-Base Excess 5.4 (*)    All other components within normal limits  CBC WITH DIFFERENTIAL/PLATELET - Abnormal; Notable for the following components:   WBC 12.2 (*)    RBC 3.38 (*)    Hemoglobin 9.5 (*)    HCT 31.1 (*)    RDW 18.7 (*)    Platelets 94 (*)    nRBC 0.5 (*)    Neutro Abs 11.3 (*)    Lymphs Abs 0.3 (*)    Abs Immature Granulocytes 0.14 (*)    All other components within normal limits  I-STAT CHEM 8, ED - Abnormal; Notable for the following components:   Potassium 3.2 (*)    Hemoglobin 9.9 (*)    HCT 29.0 (*)    All other components within normal limits  RESP PANEL BY RT-PCR (RSV, FLU A&B, COVID)   RVPGX2  EXPECTORATED SPUTUM ASSESSMENT W GRAM STAIN, RFLX TO RESP C  BRAIN NATRIURETIC PEPTIDE  MAGNESIUM  STREP PNEUMONIAE URINARY ANTIGEN  TROPONIN I (HIGH SENSITIVITY)  TROPONIN I (HIGH SENSITIVITY)    EKG EKG Interpretation Date/Time:  Tuesday October 17 2023 09:11:50 EST Ventricular Rate:  97 PR Interval:  140 QRS Duration:  73 QT Interval:  346 QTC Calculation: 440 R Axis:   63  Text Interpretation: Sinus rhythm Consider right atrial enlargement Nonspecific T abnrm, anterolateral leads Confirmed by Gloris Manchester ([id number]) on 10/17/2023 10:37:46 AM  Radiology CT Angio Chest PE W and/or Wo Contrast Result Date: 10/17/2023 CLINICAL DATA:  Pulmonary embolism (PE) suspected, high prob. Shortness of breath and chest pain. * Tracking Code: BO * EXAM: CT ANGIOGRAPHY CHEST WITH CONTRAST TECHNIQUE: Multidetector CT imaging of the chest was performed using the standard protocol during bolus administration of intravenous contrast. Multiplanar CT image reconstructions and MIPs were obtained to evaluate the vascular anatomy. RADIATION DOSE REDUCTION: This exam was performed according to the departmental dose-optimization program which includes automated exposure control, adjustment of the mA and/or kV according to patient size and/or use of iterative reconstruction technique. CONTRAST:  75mL OMNIPAQUE IOHEXOL 350 MG/ML SOLN COMPARISON:  CT angiography chest from 09/12/2023. FINDINGS: Cardiovascular: Evaluation of pulmonary embolism beyond the segmental branches is limited due to patient's respiratory motion. There is no embolism to the segmental pulmonary artery level. Normal cardiac size. No pericardial effusion. No aortic aneurysm. There are coronary artery calcifications, in keeping with coronary artery disease. There are also mild peripheral atherosclerotic vascular calcifications of thoracic aorta and its major branches. Mediastinum/Nodes: There is a 0.8 x 1.1 cm hypoattenuating nodule in the  inferior right thyroid lobe, which is incompletely characterized on the current examination but appears unchanged since the prior study. Redemonstration of multiple enlarged hilar and mediastinal lymph nodes, compatible with metastases. There is mild interval decrease in the dominant right hilar nodal mass which currently measures 4.4 x 5.6 cm, previously 5.2 x 7.0 cm. No axillary lymphadenopathy by size criteria. The esophagus is nondistended precluding optimal assessment. Lungs/Pleura: There is linear filling defect along the lower portion of the lower trachea extending into the right main bronchus, likely mucus/secretion. The central tracheo-bronchial tree is otherwise patent. Since the prior study, there is new small-to-moderate left pleural effusion with associated compressive atelectatic changes. Redemonstration of multiple bilateral lobulated pulmonary masses with largest in the right lung apex measuring 3.5 x 4.6 cm, which previously measured 3.9 x 5.1 cm, when remeasured in similar fashion. The nodule exhibits new central cavitation/Re-aeration of lung parenchyma. No right pleural effusion. No pneumothorax on either side. Many nodules exhibit new surrounding ground-glass changes. Upper Abdomen: There are persistent ill-defined, hypoattenuating masses in the liver with largest in the left hepatic lobe, segment 2 measuring 5.0 x 8.1 cm. These are incompletely characterized on the current examination but highly concerning for metastases. There are ill-defined hypoattenuating lesions in the spleen as well, also favored to represent metastases. Remaining visualized upper abdominal viscera within normal limits. Musculoskeletal: A CT Port-a-Cath is seen in the left upper chest wall with the catheter terminating in the cavo-atrial junction region. Visualized soft tissues of the chest wall are otherwise grossly unremarkable. No suspicious osseous lesions. There are mild multilevel degenerative changes in the  visualized spine. Review of the MIP images confirms the above findings. IMPRESSION: 1. No embolism to the segmental pulmonary artery level. 2. Interval decrease in the multiple bilateral lung nodules as well as hilar/mediastinal lymphadenopathy. Also there are new ground-glass changes surrounding multiple lung nodules, which may also represent posttreatment changes. Please see above for details. 3. There is new small-to-moderate left pleural effusion with  associated compressive atelectatic changes. No right pleural effusion. 4. There are ill-defined hypoattenuating masses in the liver and spleen, highly concerning for metastases. Further imaging with dedicated contrast-enhanced MRI abdomen as per liver mass protocol is recommended. 5. Multiple other nonacute observations, as described above. Aortic Atherosclerosis (ICD10-I70.0). Electronically Signed   By: Jules Schick M.D.   On: 10/17/2023 13:41   DG Chest Port 1 View Result Date: 10/17/2023 CLINICAL DATA:  Dyspnea.  Shortness of breath and chest pain. EXAM: PORTABLE CHEST 1 VIEW COMPARISON:  09/18/2023. FINDINGS: Multiple lobular opacities are again seen throughout bilateral lungs, right more than left, compatible with patient's known history of metastases. There is interval decrease in the size of the lesions when compared to the prior radiograph from 09/18/2023. However, since the prior study, there is new left retrocardiac airspace opacity obscuring the left hemidiaphragm, descending thoracic aorta and blunting the left lateral costophrenic angle suggesting combination of left lower lobe atelectasis and/or consolidation with pleural effusion. Right lateral costophrenic angle is clear. Stable cardio-mediastinal silhouette. No acute osseous abnormalities. The soft tissues are within normal limits. Redemonstration of left-sided CT Port-A-Cath with its tip overlying the cavoatrial junction region, unchanged. IMPRESSION: *New left retrocardiac opacity, as  described above. *Interval decrease in the multiple lobular opacities throughout bilateral lungs, suggesting interval improvement in the metastases. Electronically Signed   By: Jules Schick M.D.   On: 10/17/2023 11:57    Procedures Procedures    Medications Ordered in ED Medications  cefTRIAXone (ROCEPHIN) 1 g in sodium chloride 0.9 % 100 mL IVPB (has no administration in time range)  doxycycline (VIBRAMYCIN) 100 mg in dextrose 5 % 250 mL IVPB (has no administration in time range)  acetaminophen (TYLENOL) tablet 650 mg (has no administration in time range)    Or  acetaminophen (TYLENOL) suppository 650 mg (has no administration in time range)  ondansetron (ZOFRAN) tablet 4 mg (has no administration in time range)    Or  ondansetron (ZOFRAN) injection 4 mg (has no administration in time range)  albuterol (PROVENTIL) (2.5 MG/3ML) 0.083% nebulizer solution 2.5 mg (has no administration in time range)  cefTRIAXone (ROCEPHIN) 1 g in sodium chloride 0.9 % 100 mL IVPB (0 g Intravenous Stopped 10/17/23 1121)  doxycycline (VIBRA-TABS) tablet 100 mg (100 mg Oral Given 10/17/23 1031)  LORazepam (ATIVAN) injection 1 mg (1 mg Intravenous Given 10/17/23 1047)  iohexol (OMNIPAQUE) 350 MG/ML injection 75 mL (75 mLs Intravenous Contrast Given 10/17/23 1105)  potassium chloride (KLOR-CON) packet 40 mEq (40 mEq Oral Given 10/17/23 1224)    ED Course/ Medical Decision Making/ A&P                                 Medical Decision Making Amount and/or Complexity of Data Reviewed Labs: ordered. Radiology: ordered.  Risk Prescription drug management. Decision regarding hospitalization.   This patient presents to the ED for concern of shortness of breath, this involves an extensive number of treatment options, and is a complaint that carries with it a high risk of complications and morbidity.  The differential diagnosis includes pneumonia, worsening of cancer, pulmonary edema, pleural effusion,  pericardial effusion, CHF, PE   Co morbidities that complicate the patient evaluation  PE, HLD, anemia, asthma, HTN, metastatic lung cancer, GERD   Additional history obtained:  Additional history obtained from EMS External records from outside source obtained and reviewed including EMR   Lab Tests:  I Ordered, and personally  interpreted labs.  The pertinent results include: Leukocytosis is present.  Hypokalemia is present with otherwise normal electrolytes.  Troponin and BNP are normal.   Imaging Studies ordered:  I ordered imaging studies including chest x-ray, CTA chest I independently visualized and interpreted imaging which showed new small moderate left pleural effusion I agree with the radiologist interpretation   Cardiac Monitoring: / EKG:  The patient was maintained on a cardiac monitor.  I personally viewed and interpreted the cardiac monitored which showed an underlying rhythm of: Sinus rhythm  Problem List / ED Course / Critical interventions / Medication management  Patient presents for shortness of breath.  EMS noted SpO2 90% on room air.  She does arrive on nasal cannula.  When taken off of supplemental oxygen, patient does maintain SpO2 of 95%.  Vital signs are notable for tachycardia and tachypnea.  She has increased work breathing and is unable to complete sentences.  There is diminished breath sounds on lung auscultation.  Workup was initiated.  Initially, BiPAP was ordered for work of breathing.  Patient had a difficult time tolerating this.  Ativan was ordered for anxiolysis.  She continued to refuse BiPAP.  She was placed on heated high flow for help with work of breathing.  She was able to tolerate this well.  Lab work is notable for hypokalemia and leukocytosis.  There is a new retrocardiac opacity on x-ray.  Patient was treated empirically for pneumonia with antibiotics.  Vital signs remained stable.  She is pain-free at this time.  Patient was admitted for  further management. I ordered medication including Ativan for anxiolysis; potassium chloride for hypokalemia; ceftriaxone and doxycycline for empiric treatment of pneumonia Reevaluation of the patient after these medicines showed that the patient improved I have reviewed the patients home medicines and have made adjustments as needed   Social Determinants of Health:  Has access to outpatient care  CRITICAL CARE Performed by: Gloris Manchester   Total critical care time: 32 minutes  Critical care time was exclusive of separately billable procedures and treating other patients.  Critical care was necessary to treat or prevent imminent or life-threatening deterioration.  Critical care was time spent personally by me on the following activities: development of treatment plan with patient and/or surrogate as well as nursing, discussions with consultants, evaluation of patient's response to treatment, examination of patient, obtaining history from patient or surrogate, ordering and performing treatments and interventions, ordering and review of laboratory studies, ordering and review of radiographic studies, pulse oximetry and re-evaluation of patient's condition.         Final Clinical Impression(s) / ED Diagnoses Final diagnoses:  SOB (shortness of breath)  Pleural effusion    Rx / DC Orders ED Discharge Orders     None         Gloris Manchester, MD 10/17/23 1536

## 2023-10-18 ENCOUNTER — Other Ambulatory Visit: Payer: No Typology Code available for payment source

## 2023-10-18 ENCOUNTER — Ambulatory Visit: Payer: No Typology Code available for payment source | Admitting: Physician Assistant

## 2023-10-18 ENCOUNTER — Ambulatory Visit: Payer: No Typology Code available for payment source

## 2023-10-18 ENCOUNTER — Inpatient Hospital Stay (HOSPITAL_COMMUNITY): Payer: No Typology Code available for payment source

## 2023-10-18 DIAGNOSIS — M7989 Other specified soft tissue disorders: Secondary | ICD-10-CM | POA: Diagnosis not present

## 2023-10-18 DIAGNOSIS — J9601 Acute respiratory failure with hypoxia: Secondary | ICD-10-CM | POA: Diagnosis not present

## 2023-10-18 DIAGNOSIS — C3490 Malignant neoplasm of unspecified part of unspecified bronchus or lung: Secondary | ICD-10-CM | POA: Diagnosis not present

## 2023-10-18 LAB — BODY FLUID CELL COUNT WITH DIFFERENTIAL
Eos, Fluid: 0 %
Lymphs, Fluid: 2 %
Monocyte-Macrophage-Serous Fluid: 11 % — ABNORMAL LOW (ref 50–90)
Neutrophil Count, Fluid: 87 % — ABNORMAL HIGH (ref 0–25)
Total Nucleated Cell Count, Fluid: 2989 uL — ABNORMAL HIGH (ref 0–1000)

## 2023-10-18 LAB — COMPREHENSIVE METABOLIC PANEL
ALT: 85 U/L — ABNORMAL HIGH (ref 0–44)
AST: 36 U/L (ref 15–41)
Albumin: 2.6 g/dL — ABNORMAL LOW (ref 3.5–5.0)
Alkaline Phosphatase: 100 U/L (ref 38–126)
Anion gap: 5 (ref 5–15)
BUN: 17 mg/dL (ref 8–23)
CO2: 24 mmol/L (ref 22–32)
Calcium: 8.1 mg/dL — ABNORMAL LOW (ref 8.9–10.3)
Chloride: 106 mmol/L (ref 98–111)
Creatinine, Ser: 0.5 mg/dL (ref 0.44–1.00)
GFR, Estimated: >60 mL/min (ref >60)
Glucose, Bld: 106 mg/dL — ABNORMAL HIGH (ref 70–99)
Potassium: 3.6 mmol/L (ref 3.5–5.1)
Sodium: 135 mmol/L (ref 135–145)
Total Bilirubin: 1.3 mg/dL — ABNORMAL HIGH (ref <1.2)
Total Protein: 5 g/dL — ABNORMAL LOW (ref 6.5–8.1)

## 2023-10-18 LAB — CBC
HCT: 25.5 % — ABNORMAL LOW (ref 36.0–46.0)
HCT: 25.8 % — ABNORMAL LOW (ref 36.0–46.0)
Hemoglobin: 7.8 g/dL — ABNORMAL LOW (ref 12.0–15.0)
Hemoglobin: 8 g/dL — ABNORMAL LOW (ref 12.0–15.0)
MCH: 28.4 pg (ref 26.0–34.0)
MCH: 28.6 pg (ref 26.0–34.0)
MCHC: 30.6 g/dL (ref 30.0–36.0)
MCHC: 31 g/dL (ref 30.0–36.0)
MCV: 92.7 fL (ref 80.0–100.0)
MCV: 92.1 fL (ref 80.0–100.0)
Platelets: 71 10*3/uL — ABNORMAL LOW (ref 150–400)
Platelets: 76 10*3/uL — ABNORMAL LOW (ref 150–400)
RBC: 2.75 MIL/uL — ABNORMAL LOW (ref 3.87–5.11)
RBC: 2.8 MIL/uL — ABNORMAL LOW (ref 3.87–5.11)
RDW: 18.6 % — ABNORMAL HIGH (ref 11.5–15.5)
RDW: 18.5 % — ABNORMAL HIGH (ref 11.5–15.5)
WBC: 8.7 10*3/uL (ref 4.0–10.5)
WBC: 8.6 10*3/uL (ref 4.0–10.5)
nRBC: 0.3 % — ABNORMAL HIGH (ref 0.0–0.2)
nRBC: 0.3 % — ABNORMAL HIGH (ref 0.0–0.2)

## 2023-10-18 LAB — HEMOGLOBIN AND HEMATOCRIT, BLOOD
HCT: 25.6 % — ABNORMAL LOW (ref 36.0–46.0)
HCT: 26.4 % — ABNORMAL LOW (ref 36.0–46.0)
Hemoglobin: 7.8 g/dL — ABNORMAL LOW (ref 12.0–15.0)
Hemoglobin: 8 g/dL — ABNORMAL LOW (ref 12.0–15.0)

## 2023-10-18 LAB — MRSA NEXT GEN BY PCR, NASAL: MRSA by PCR Next Gen: NOT DETECTED

## 2023-10-18 LAB — IRON AND TIBC
Iron: 58 ug/dL (ref 28–170)
Saturation Ratios: 25 % (ref 10.4–31.8)
TIBC: 228 ug/dL — ABNORMAL LOW (ref 250–450)
UIBC: 170 ug/dL

## 2023-10-18 LAB — HEPARIN LEVEL (UNFRACTIONATED): Heparin Unfractionated: >1.1 [IU]/mL — ABNORMAL HIGH (ref 0.30–0.70)

## 2023-10-18 LAB — PROTEIN, PLEURAL OR PERITONEAL FLUID: Total protein, fluid: 3.3 g/dL

## 2023-10-18 LAB — APTT: aPTT: 156 s — ABNORMAL HIGH (ref 24–36)

## 2023-10-18 LAB — GLUCOSE, PLEURAL OR PERITONEAL FLUID: Glucose, Fluid: 72 mg/dL

## 2023-10-18 LAB — VITAMIN B12: Vitamin B-12: 281 pg/mL (ref 180–914)

## 2023-10-18 LAB — IMMATURE PLATELET FRACTION: Immature Platelet Fraction: 11.5 % — ABNORMAL HIGH (ref 1.2–8.6)

## 2023-10-18 LAB — RETICULOCYTES
Immature Retic Fract: 29.6 % — ABNORMAL HIGH (ref 2.3–15.9)
RBC.: 2.86 MIL/uL — ABNORMAL LOW (ref 3.87–5.11)
Retic Count, Absolute: 150.7 10*3/uL (ref 19.0–186.0)
Retic Ct Pct: 5.3 % — ABNORMAL HIGH (ref 0.4–3.1)

## 2023-10-18 LAB — LACTATE DEHYDROGENASE: LDH: 291 U/L — ABNORMAL HIGH (ref 98–192)

## 2023-10-18 LAB — LACTATE DEHYDROGENASE, PLEURAL OR PERITONEAL FLUID: LD, Fluid: 227 U/L — ABNORMAL HIGH (ref 3–23)

## 2023-10-18 LAB — PROCALCITONIN: Procalcitonin: <0.1 ng/mL

## 2023-10-18 LAB — PROTEIN, TOTAL: Total Protein: 5.1 g/dL — ABNORMAL LOW (ref 6.5–8.1)

## 2023-10-18 LAB — FERRITIN: Ferritin: 650 ng/mL — ABNORMAL HIGH (ref 11–307)

## 2023-10-18 LAB — CHOLESTEROL, TOTAL: Cholesterol: 139 mg/dL (ref 0–200)

## 2023-10-18 LAB — FOLATE: Folate: >40 ng/mL (ref >5.9)

## 2023-10-18 MED ORDER — KETOROLAC TROMETHAMINE 15 MG/ML IJ SOLN
15.0000 mg | Freq: Once | INTRAMUSCULAR | Status: AC
  Administered 2023-10-18: 15 mg via INTRAVENOUS
  Filled 2023-10-18: qty 1

## 2023-10-18 MED ORDER — ENSURE ENLIVE PO LIQD
237.0000 mL | Freq: Two times a day (BID) | ORAL | Status: DC
  Administered 2023-10-19 – 2023-10-22 (×2): 237 mL via ORAL

## 2023-10-18 MED ORDER — HEPARIN (PORCINE) 25000 UT/250ML-% IV SOLN
1000.0000 [IU]/h | INTRAVENOUS | Status: DC
  Administered 2023-10-18: 1000 [IU]/h via INTRAVENOUS

## 2023-10-18 MED ORDER — CELECOXIB 200 MG PO CAPS: 200.0000 mg | ORAL_CAPSULE | Freq: Two times a day (BID) | ORAL | Status: AC | PRN

## 2023-10-18 MED ORDER — LOPERAMIDE HCL 2 MG PO CAPS
2.0000 mg | ORAL_CAPSULE | Freq: Four times a day (QID) | ORAL | Status: AC | PRN
  Administered 2023-10-18 – 2023-10-19 (×2): 2 mg via ORAL
  Filled 2023-10-18 (×2): qty 1

## 2023-10-18 NOTE — Plan of Care (Signed)
  Problem: Education: Goal: Knowledge of General Education information will improve Description Including pain rating scale, medication(s)/side effects and non-pharmacologic comfort measures Outcome: Progressing   

## 2023-10-18 NOTE — Progress Notes (Signed)
S/p thora earlier today F/u cxr improved. Color, initial analysis c/w exudate and raising concern for either malignant sample OR hemothorax.  We are still awaiting pleural hematocrit for now holding heparin, getting LE [place] to f/u DVT CT did not show residual clot.  Plan Hold heparin for now If pleural crit comes back < 50% and CXR stable will resume heparin no bolus in am. For now w/ hgb drop and concern about possible hemothorax hold. We have sent full pleural analysis   Simonne Martinet ACNP-BC [facility] Pulmonary/Critical Care Pager # [phone] OR # [phone] if no answer

## 2023-10-18 NOTE — Procedures (Signed)
Thoracentesis  Procedure Note  Rebecca Ochoa  [id number]  1960-11-30  Date:10/18/23  Time:12:04 PM   Provider Performing:Pete E Tanja Port   Procedure: Thoracentesis with imaging guidance (03500)  Indication(s) Pleural Effusion  Consent Risks of the procedure as well as the alternatives and risks of each were explained to the patient and/or caregiver.  Consent for the procedure was obtained and is signed in the bedside chart  Anesthesia Topical only with 1% lidocaine    Time Out Verified patient identification, verified procedure, site/side was marked, verified correct patient position, special equipment/implants available, medications/allergies/relevant history reviewed, required imaging and test results available.   Sterile Technique Maximal sterile technique including full sterile barrier drape, hand hygiene, sterile gown, sterile gloves, mask, hair covering, sterile ultrasound probe cover (if used).  Procedure Description Ultrasound was used to identify appropriate pleural anatomy for placement and overlying skin marked.  Area of drainage cleaned and draped in sterile fashion. Lidocaine was used to anesthetize the skin and subcutaneous tissue.  700 cc's of bloody appearing fluid was drained from the left pleural space. Catheter then removed and bandaid applied to site.  Pt did report some discomfort towards end of procedure. + excellent sliding lung noted both anterior and posterior   Complications/Tolerance None; patient tolerated the procedure well. Chest X-ray is ordered to confirm no post-procedural complication.   EBL Minimal   Specimen(s) Pleural fluid

## 2023-10-18 NOTE — Progress Notes (Signed)
PHARMACY - ANTICOAGULATION CONSULT NOTE  Pharmacy Consult for heparin  Indication: bridge therapy- apixaban for PE on hold  Allergies  Allergen Reactions   Biaxin [Clarithromycin] Nausea Only    Patient Measurements: Height: 5\' 5"  (165.1 cm) Weight: 68.2 kg (150 lb 5.7 oz) IBW/kg (Calculated) : 57 Heparin Dosing Weight: 68.5 kg  Vital Signs: Temp: 98.3 F (36.8 C) (12/18 0000) Temp Source: Oral (12/18 0000) BP: 162/71 (12/18 0400) Pulse Rate: 81 (12/18 0400)  Labs: Recent Labs    10/17/23 0931 10/17/23 0943 10/17/23 1127 10/17/23 1944 10/18/23 0345  HGB 9.5* 9.9*  --   --  7.8*  HCT 31.1* 29.0*  --   --  25.5*  PLT 94*  --   --   --  71*  APTT  --   --   --  <22* 156*  HEPARINUNFRC  --   --   --  >1.10* >1.10*  CREATININE 0.56 0.60  --   --  0.50  TROPONINIHS 4  --  4  --   --     Estimated Creatinine Clearance: 65.6 mL/min (by C-G formula based on SCr of 0.5 mg/dL).   Medical History: Past Medical History:  Diagnosis Date   Anemia    Asthma, mild intermittent, well-controlled    GERD (gastroesophageal reflux disease)    Hx of migraines    Hyperlipidemia    Hypertension    Vitamin D deficiency     Medications:  Apixaban 5 mg po BID last dose 12/17 @ 8 am  Assessment: 62 yo F with PE dx by CTA 09/04/2023.  Pharmacy consulted to dose heparin for bridge therapy while apixaban on hold.  Pt presenting with 1 day of hemoptysis and shortness of breath.  Hg 9.9, PLT 94 Last dose of apixaban 5 bid 12/17 8 am  10/18/2023:  aPTT 156 sec= supra-therapeutic on IV heparin 1250 units/hr. Verified heparin infusing thru port and phlebotomist drew labs from PIV. Heparin level >1.1 as anticipated due to interaction with apixaban.  Continue to monitor heparin using aptt until levels correlate CBC: Hg low at 7.8, pltc low at 70 No bleeding or infusion related concerns noted by RN/patient  Goal of Therapy:  Heparin level 0.3-0.7 units/ml aPTT 66-102 seconds Monitor  platelets by anticoagulation protocol: Yes   Plan:  Hold heparin x 1 hour At 6am, resume heparin at decreased rate of 1000 units/hr at 2000  Check aPTT 8 hrs after restarted - 12/18 @ 1400 Daily CBC, aPTT, heparin level  Junita Push, PharmD, BCPS 10/18/2023 4:57 AM

## 2023-10-18 NOTE — Progress Notes (Signed)
Progress Note   Patient: Rebecca Ochoa ZOX:[id number] DOB: 10-03-1961 DOA: 10/17/2023     1 DOS: the patient was seen and examined on 10/18/2023   Brief hospital course: No notes on file  Nakirah Silverstone is a 62 y.o. female with medical history significant of anemia, mild intermittent asthma, GERD, migraine headaches, hyperlipidemia, hypertension, vitamin D deficiency, history of pulmonary embolism on rivaroxaban, lung cancer undergoing palliative radiotherapy and oral chemotherapy, but has not started chemotherapy infusions due to insurance issues who presented to the emergency department via EMS with dyspnea and pleuritic chest pain for the past day.  She has also been having hemoptysis. She was placed on high flow nasal cannula.    Portable 1 view chest radiograph showing a new left retrocardiac opacity. Interval decrease in the multiple lobular opacities throughout bilateral lungs, suggesting interval improvement of the metastasis. CTA chest with no embolism to the segmental pulmonary artery level. Interval decrease in multiple bilateral lung nodules as well as hilar/mediastinal lymphadenopathy. There are new groundglass changes surrounding multiple lung nodules, which may represent posttreatment changes. There is new small to moderate left pleural effusion with associated compressive atelectatic changes. No right pleural effusion. There are ill-defined hypoattenuating masses in the liver and spleen, highly concerning for metastasis. Further imaging with dedicated contrast-enhanced MRI abdomen recommended. Aortic atherosclerosis.   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 Although procalcitonin is negative, patient is a pneumonia immunocompromised.  Has been  started on empiric antibiotics doxycycline plus zosyn.  Etiology of hypoxemia is felt to be multifactorial including possible blood in the airways seen on the CAT scan, see pulmonary note, possible inflammation from radiation, pleural effusion.  Pleural effusion drained by PCCM today, look forward to their investigation of the sample.  No active hemoptysis at this time.   Active Problems:   Hyperlipidemia Currently not on therapy. Follow-up with PCP.     Essential hypertension Not on antihypertensives at the moment. Monitor blood pressure. Patient's diastolic blood pressure is only 56 at this time.  I will check cortisol and TSH in the morning     Pulmonary embolism (HCC) Was on rivaroxaban. She has been switched to heparin. Stop if platelet drop below 50K (heparin hld by PCCm for thoracentesis)  Anemia: Patient has worsening anemia as seen by the trend over the last couple of days associated with worsening thrombocytopenia.  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  I am concerned that patient may have some degree of symptomatic anemia.  I will check type and screen.  And trend the CBC.  I will send a message to Dr. Arbutus Ped of oncology to help out in this regard.  I will also do a full anemia workup     Subjective: Patient reports marked fatigue with attempts to get out of bed.  Has had poor appetite, no diarrhea or constipation currently reported.  No rigors no chest pain.  Does get short of breath with attempts to ambulate.  Has been using bedside commode. Curently no cough, no hemoptysis  Physical Exam: Vitals:   10/18/23 0500 10/18/23 0600 10/18/23 0700 10/18/23 0731  BP: (!) 144/69 129/68 (!) 139/56   Pulse: 92 72 76 90  Resp: (!) 29 18 17  (!) 24  Temp:    98.4 F (36.9 C)  TempSrc:    Oral  SpO2: 100% 100% 100% 100%  Weight:      Height:       General: Alert and awake, no immediate distress apparent.  However thin lady appears to be fatigued.   Son Respiratory exam: Reduced air entry right basilar area posteriorly with dullness to percussion.  Left side shows signs of recent thoracentesis good air entry on that side Cardiovascular exam S1-S2 normal Abdomen all quadrant soft nontender Extremities warm without edema without focal deficit. Data Reviewed:  Labs on Admission:  Results for orders placed or performed during the hospital encounter of 10/17/23 (from the past 24 hours)  APTT     Status: Abnormal   Collection Time: 10/17/23  7:44 PM  Result Value Ref Range   aPTT <22 (L) 24 - 36 seconds  Heparin level (unfractionated)     Status: Abnormal   Collection Time: 10/17/23  7:44 PM  Result Value Ref Range   Heparin Unfractionated >1.10 (H) 0.30 - 0.70 IU/mL  MRSA Next Gen by PCR, Nasal     Status: None   Collection Time: 10/17/23 11:56 PM   Specimen: Nasal Mucosa; Nasal Swab  Result Value Ref Range   MRSA by PCR Next Gen NOT DETECTED NOT DETECTED  Comprehensive metabolic panel     Status: Abnormal   Collection Time: 10/18/23  3:45 AM  Result Value Ref Range   Sodium 135 135 - 145 mmol/L   Potassium 3.6 3.5 - 5.1 mmol/L   Chloride 106 98 - 111 mmol/L   CO2 24 22 - 32 mmol/L   Glucose, Bld 106 (H) 70 - 99 mg/dL   BUN 17 8 - 23 mg/dL   Creatinine, Ser 1.61 0.44 - 1.00 mg/dL   Calcium 8.1 (L) 8.9 - 10.3 mg/dL   Total Protein 5.0 (L) 6.5 - 8.1 g/dL   Albumin 2.6 (L) 3.5 - 5.0 g/dL   AST 36 15 - 41 U/L   ALT 85 (H) 0 - 44 U/L   Alkaline Phosphatase 100 38 - 126 U/L   Total Bilirubin 1.3 (H) <1.2 mg/dL   GFR, Estimated >09 >60 mL/min   Anion gap 5 5 - 15  APTT     Status: Abnormal   Collection Time: 10/18/23  3:45 AM  Result Value Ref Range   aPTT 156 (H) 24 - 36 seconds  Heparin level (unfractionated)     Status: Abnormal   Collection Time: 10/18/23  3:45 AM  Result Value Ref Range   Heparin Unfractionated >1.10 (H) 0.30 - 0.70 IU/mL  CBC     Status: Abnormal   Collection Time: 10/18/23  3:45 AM  Result Value Ref  Range   WBC 8.7 4.0 - 10.5 K/uL   RBC 2.75 (L) 3.87 - 5.11 MIL/uL   Hemoglobin 7.8 (L) 12.0 - 15.0 g/dL   HCT 45.4 (L) 09.8 - 11.9 %   MCV 92.7 80.0 - 100.0 fL   MCH 28.4 26.0 - 34.0 pg   MCHC 30.6 30.0 - 36.0 g/dL   RDW 14.7 (H) 82.9 - 56.2 %   Platelets 71 (L) 150 - 400 K/uL   nRBC 0.3 (H) 0.0 - 0.2 %  Procalcitonin     Status: None   Collection Time: 10/18/23  3:45 AM  Result Value Ref Range   Procalcitonin <0.10 ng/mL  Hemoglobin and hematocrit, blood     Status: Abnormal   Collection Time: 10/18/23  6:44  AM  Result Value Ref Range   Hemoglobin 7.8 (L) 12.0 - 15.0 g/dL   HCT 84.1 (L) 66.0 - 63.0 %  Lactate dehydrogenase (pleural or peritoneal fluid)     Status: Abnormal   Collection Time: 10/18/23 11:56 AM  Result Value Ref Range   LD, Fluid 227 (H) 3 - 23 U/L   Fluid Type-FLDH Pleural, L   Protein, pleural or peritoneal fluid     Status: None   Collection Time: 10/18/23 11:56 AM  Result Value Ref Range   Total protein, fluid 3.3 g/dL   Fluid Type-FTP Pleural, L   Glucose, pleural or peritoneal fluid     Status: None   Collection Time: 10/18/23 11:56 AM  Result Value Ref Range   Glucose, Fluid 72 mg/dL   Fluid Type-FGLU Pleural, L   Hemoglobin and hematocrit, blood     Status: Abnormal   Collection Time: 10/18/23 12:43 PM  Result Value Ref Range   Hemoglobin 8.0 (L) 12.0 - 15.0 g/dL   HCT 16.0 (L) 10.9 - 32.3 %  Lactate dehydrogenase     Status: Abnormal   Collection Time: 10/18/23 12:43 PM  Result Value Ref Range   LDH 291 (H) 98 - 192 U/L  Protein, total     Status: Abnormal   Collection Time: 10/18/23 12:43 PM  Result Value Ref Range   Total Protein 5.1 (L) 6.5 - 8.1 g/dL   Basic Metabolic Panel: Recent Labs  Lab 10/17/23 0931 10/17/23 0943 10/18/23 0345  NA 138 138 135  K 3.2* 3.2* 3.6  CL 102 102 106  CO2 27  --  24  GLUCOSE 98 94 106*  BUN 17 15 17   CREATININE 0.56 0.60 0.50  CALCIUM 8.7*  --  8.1*  MG 2.4  --   --    Liver Function  Tests: Recent Labs  Lab 10/17/23 0931 10/18/23 0345 10/18/23 1243  AST 49* 36  --   ALT 106* 85*  --   ALKPHOS 117 100  --   BILITOT 1.4* 1.3*  --   PROT 5.8* 5.0* 5.1*  ALBUMIN 3.0* 2.6*  --    No results for input(s): "LIPASE", "AMYLASE" in the last 168 hours. No results for input(s): "AMMONIA" in the last 168 hours. CBC: Recent Labs  Lab 10/17/23 0931 10/17/23 0943 10/18/23 0345 10/18/23 0644 10/18/23 1243  WBC 12.2*  --  8.7  --   --   NEUTROABS 11.3*  --   --   --   --   HGB 9.5* 9.9* 7.8* 7.8* 8.0*  HCT 31.1* 29.0* 25.5* 25.6* 26.4*  MCV 92.0  --  92.7  --   --   PLT 94*  --  71*  --   --    Cardiac Enzymes: Recent Labs  Lab 10/17/23 0931 10/17/23 1127  TROPONINIHS 4 4    BNP (last 3 results) No results for input(s): "PROBNP" in the last 8760 hours. CBG: No results for input(s): "GLUCAP" in the last 168 hours.  Radiological Exams on Admission:  CT Angio Chest PE W and/or Wo Contrast Result Date: 10/17/2023 CLINICAL DATA:  Pulmonary embolism (PE) suspected, high prob. Shortness of breath and chest pain. * Tracking Code: BO * EXAM: CT ANGIOGRAPHY CHEST WITH CONTRAST TECHNIQUE: Multidetector CT imaging of the chest was performed using the standard protocol during bolus administration of intravenous contrast. Multiplanar CT image reconstructions and MIPs were obtained to evaluate the vascular anatomy. RADIATION DOSE REDUCTION: This exam was performed according  to the departmental dose-optimization program which includes automated exposure control, adjustment of the mA and/or kV according to patient size and/or use of iterative reconstruction technique. CONTRAST:  75mL OMNIPAQUE IOHEXOL 350 MG/ML SOLN COMPARISON:  CT angiography chest from 09/12/2023. FINDINGS: Cardiovascular: Evaluation of pulmonary embolism beyond the segmental branches is limited due to patient's respiratory motion. There is no embolism to the segmental pulmonary artery level. Normal cardiac size.  No pericardial effusion. No aortic aneurysm. There are coronary artery calcifications, in keeping with coronary artery disease. There are also mild peripheral atherosclerotic vascular calcifications of thoracic aorta and its major branches. Mediastinum/Nodes: There is a 0.8 x 1.1 cm hypoattenuating nodule in the inferior right thyroid lobe, which is incompletely characterized on the current examination but appears unchanged since the prior study. Redemonstration of multiple enlarged hilar and mediastinal lymph nodes, compatible with metastases. There is mild interval decrease in the dominant right hilar nodal mass which currently measures 4.4 x 5.6 cm, previously 5.2 x 7.0 cm. No axillary lymphadenopathy by size criteria. The esophagus is nondistended precluding optimal assessment. Lungs/Pleura: There is linear filling defect along the lower portion of the lower trachea extending into the right main bronchus, likely mucus/secretion. The central tracheo-bronchial tree is otherwise patent. Since the prior study, there is new small-to-moderate left pleural effusion with associated compressive atelectatic changes. Redemonstration of multiple bilateral lobulated pulmonary masses with largest in the right lung apex measuring 3.5 x 4.6 cm, which previously measured 3.9 x 5.1 cm, when remeasured in similar fashion. The nodule exhibits new central cavitation/Re-aeration of lung parenchyma. No right pleural effusion. No pneumothorax on either side. Many nodules exhibit new surrounding ground-glass changes. Upper Abdomen: There are persistent ill-defined, hypoattenuating masses in the liver with largest in the left hepatic lobe, segment 2 measuring 5.0 x 8.1 cm. These are incompletely characterized on the current examination but highly concerning for metastases. There are ill-defined hypoattenuating lesions in the spleen as well, also favored to represent metastases. Remaining visualized upper abdominal viscera within normal  limits. Musculoskeletal: A CT Port-a-Cath is seen in the left upper chest wall with the catheter terminating in the cavo-atrial junction region. Visualized soft tissues of the chest wall are otherwise grossly unremarkable. No suspicious osseous lesions. There are mild multilevel degenerative changes in the visualized spine. Review of the MIP images confirms the above findings. IMPRESSION: 1. No embolism to the segmental pulmonary artery level. 2. Interval decrease in the multiple bilateral lung nodules as well as hilar/mediastinal lymphadenopathy. Also there are new ground-glass changes surrounding multiple lung nodules, which may also represent posttreatment changes. Please see above for details. 3. There is new small-to-moderate left pleural effusion with associated compressive atelectatic changes. No right pleural effusion. 4. There are ill-defined hypoattenuating masses in the liver and spleen, highly concerning for metastases. Further imaging with dedicated contrast-enhanced MRI abdomen as per liver mass protocol is recommended. 5. Multiple other nonacute observations, as described above. Aortic Atherosclerosis (ICD10-I70.0). Electronically Signed   By: Jules Schick M.D.   On: 10/17/2023 13:41   DG Chest Port 1 View Result Date: 10/17/2023 CLINICAL DATA:  Dyspnea.  Shortness of breath and chest pain. EXAM: PORTABLE CHEST 1 VIEW COMPARISON:  09/18/2023. FINDINGS: Multiple lobular opacities are again seen throughout bilateral lungs, right more than left, compatible with patient's known history of metastases. There is interval decrease in the size of the lesions when compared to the prior radiograph from 09/18/2023. However, since the prior study, there is new left retrocardiac airspace opacity obscuring the  left hemidiaphragm, descending thoracic aorta and blunting the left lateral costophrenic angle suggesting combination of left lower lobe atelectasis and/or consolidation with pleural effusion. Right  lateral costophrenic angle is clear. Stable cardio-mediastinal silhouette. No acute osseous abnormalities. The soft tissues are within normal limits. Redemonstration of left-sided CT Port-A-Cath with its tip overlying the cavoatrial junction region, unchanged. IMPRESSION: *New left retrocardiac opacity, as described above. *Interval decrease in the multiple lobular opacities throughout bilateral lungs, suggesting interval improvement in the metastases. Electronically Signed   By: Jules Schick M.D.   On: 10/17/2023 11:57      Family Communication: family at bedsdie during this encounter. All questions answerd. Regular diet ordred  Disposition: Status is: Inpatient Remains inpatient appropriate because: ongoign hypoxia.  Planned Discharge Destination: Rehab    Time spent: 40 minutes  Author: Nolberto Hanlon, MD 10/18/2023 1:10 PM  For on call review [email or web address].

## 2023-10-18 NOTE — Progress Notes (Signed)
Cbc reviewed H&H stable. No increased WOB Pain resolved.  Actually looks more comfortable than she did this am   Plan F/u cxr am as well as cbc If stable resume heparin no bolus

## 2023-10-18 NOTE — Progress Notes (Signed)
NAME:  Kriti Full, MRN:  [id number], DOB:  [birth year], LOS: 1 ADMISSION DATE:  10/17/2023, CONSULTATION DATE: 10/17/23  REFERRING MD: Robb Matar CHIEF COMPLAINT:  Shortness of Breath   History of Present Illness:  Pt is a 62 yr old female with a significant past medical hx of metastatic lung cancer (diagnosed 2 months ago), recent PE (6 weeks ago) on eliquis, anemia, asthma, HTN, and GERD. Patient also had a recent hospitalization on 09/18/23 for fever, headache and leukocytosis where she was treated with antibiotics but MRI of head revealed left parietal microhemorrhages possibly related to metastatic disease vs subacute infarct with hemorrhagic conversion vs eliquis use. Patient followed up with Neurology outpatient on 12/11 in regards to brain bleed and per note seemed to be in usual state of health but with shortness of breath with exertion and deconditioning attributed to metastatic cancer. Patient presented to [facility] by EMS for worsening shortness of breath at rest but mostly with exertion. Patient's O2 sats were 90-94% on RA but with tachypnea and labored breathing. Patient was ultimately placed on BIPAP for WOB but patient could not tolerate due to anxiety and was placed on Heated HFNC instead.   Initial ED work up revealed, chest x-ray showing some decrease in lobar opacities indicating interval improvement of metastases, with left lower lobe atelectasis vs consolidation with pleural effusion, CT angio showed no PE, small left pleural effusion with atelectasis, and evidence of metastatic disease.  Negative for flu, covid, RSV PCR, WBC 12.2, hgb 9.5, and platelets 94, Trops and BNP within normal range. [facility] called to admit due dyspnea with PCCM consult to assist with increased WOB/hypoxia.   Upon assessment, patient that shortness of breath increased yesterday with some coughing evening on 12/16. Patient endorses that shortness of breath increases with exertion. Patient denies any recent sick contacts,  fevers, chills, n/v, hematemesis, hematochezia. Patient began coughing up dark, blood tinged sputum in ED. Patient reports that she is currently taking Tagrisso po daily and oral steroids for brain swelling but has not started systemic chemotherapy at this time. Since taking Tagrisso, patient has had issues with diarrhea but no other side effects.   Pertinent  Medical History   Past Medical History:  Diagnosis Date   Anemia    Asthma, mild intermittent, well-controlled    GERD (gastroesophageal reflux disease)    Hx of migraines    Hyperlipidemia    Hypertension    Vitamin D deficiency      Significant Hospital Events: Including procedures, antibiotic start and stop dates in addition to other pertinent events   12/17 Adm with [facility], PCCM consulted due to increase WOB and hemoptysis.  CT chest showed mild dec in size of bilateral lung nodules, as well as the hilar adenopathy, there was new small left pleural effusion and the mets to liver and spleen again noted. Not initially entirely clear what was driving her inc WOB.  12/18 COVID neg, BNP 84.8, neg trop I , RVP negative, MRSA neg PCR, POC [place] w/ mod-lg left effusion. Hemoptysis improved. Thora planned   Interim History / Subjective:   Feels a little better. Has not coughed up blood.  Objective   Blood pressure (!) 139/56, pulse 90, temperature 98.4 F (36.9 C), temperature source Oral, resp. rate (!) 24, height 5\' 5"  (1.651 m), weight 68.2 kg, SpO2 100%.    FiO2 (%):  [35 %] 35 %   Intake/Output Summary (Last 24 hours) at 10/18/2023 0819 Last data filed at 10/18/2023 0519 Gross per  24 hour  Intake 304.25 ml  Output 250 ml  Net 54.25 ml   Filed Weights   10/17/23 0909 10/18/23 0000  Weight: 68.5 kg 68.2 kg    Examination:  General resting in bed. Still w/ mild inc WOB. She does have an a anxiety component  HENT NCAT no JVD MMM Pulm left > right basilar/lateral rales. Much more present on left and decreased more posteriorly   POCUS demonstrates a fairly large left pleural effusion  Card rrr Abd soft Ext warm and dry  Neuro awake, oriented. No focal def but is anxious and withdrawn   Resolved Hospital Problem list   RA   Assessment & Plan:  Acute dyspnea due to aspiration PNA vs LLL ATX and mod left effusion superimposed on known h/o Asthma superimposed on metastatic EGFR + squamous cell carcinona w/ fairly heavy tumor burden (currently on Tagrisso and is s/p several rounds of XRT) Not clear if this PNA component is from aspiration of blood OR if blood is from PNA. Favoring PNA  Plan Cont supplemental oxygen  Cont pulse ox Day 2 CTX and doxy Checking PTC Left diagnostic and therapeutic thoracentesis  Repeat CXR in am   Hemoptysis  -improved. DDX for this PNA vs endobronchial lesion (PNA seems like possibly more likely as improved w/ abx)  Plan Monitor. Will cont to hold DOAC and in place use IV heparin for another 24 hrs then if no further hemoptysis resume DOAC in am   Recent Pulmonary emboli plan Resume heparin, no bolus 1 hr after tap  metastatic EGFR + squamous cell carcinona w/ fairly heavy tumor burden Plan F/u heme   Anemia  plan Monitor  Transfusion for hgb < 7   Best Practice per primary   Per primary   Critical care time:  NA

## 2023-10-18 NOTE — Progress Notes (Signed)
eLink Physician-Brief Progress Note Patient Name: Rebecca Ochoa DOB: [birth year] MRN: [id number]   Date of Service  10/18/2023  HPI/Events of Note  Patient admitted with acute hypoxemic respiratory failure secondary to a combination of asthma exacerbation, metastatic lung cancer with post-obstructive pneumonia, suspected aspiration pneumonia, and pulmonary hemorrhage.  eICU Interventions  New Patient Evaluation.        Thomasene Lot Bentzion Dauria 10/18/2023, 12:24 AM

## 2023-10-18 NOTE — Progress Notes (Signed)
PHARMACY - ANTICOAGULATION CONSULT NOTE  Pharmacy Consult for heparin (while apixaban on hold) Indication: pulmonary embolus  Allergies  Allergen Reactions   Biaxin [Clarithromycin] Nausea Only    Patient Measurements: Height: 5\' 5"  (165.1 cm) Weight: 68.2 kg (150 lb 5.7 oz) IBW/kg (Calculated) : 57 Heparin Dosing Weight: n/a. Use total body weight  Vital Signs: Temp: 98.4 F (36.9 C) (12/18 0731) Temp Source: Oral (12/18 0731) BP: 139/56 (12/18 0700) Pulse Rate: 90 (12/18 0731)  Labs: Recent Labs    10/17/23 0931 10/17/23 0943 10/17/23 1127 10/17/23 1944 10/18/23 0345 10/18/23 0644  HGB 9.5* 9.9*  --   --  7.8* 7.8*  HCT 31.1* 29.0*  --   --  25.5* 25.6*  PLT 94*  --   --   --  71*  --   APTT  --   --   --  <22* 156*  --   HEPARINUNFRC  --   --   --  >1.10* >1.10*  --   CREATININE 0.56 0.60  --   --  0.50  --   TROPONINIHS 4  --  4  --   --   --     Estimated Creatinine Clearance: 65.6 mL/min (by C-G formula based on SCr of 0.5 mg/dL).   Medical History: Past Medical History:  Diagnosis Date   Anemia    Asthma, mild intermittent, well-controlled    GERD (gastroesophageal reflux disease)    Hx of migraines    Hyperlipidemia    Hypertension    Vitamin D deficiency     Medications: Apixaban 5 mg PO BID PTA for PE -Last dose: 12/17 AM  Assessment: Patient is a 35 yoF with recently diagnosed NSCLC. Presenting with SOB, hemoptysis. PMH also significant for recent PE - prescribed apixaban. Anticoagulation transitioned from apixaban to UFH for thoracentesis.  Today, 10/18/23 Heparin currently paused for thoracentesis CBC: Hgb low and decreased, Plt low Hemoptysis improved per CCM note  Dosing using aPTT as heparin level falsely elevated due to DOAC.  Goal of Therapy:  Heparin level 0.3-0.7 units/ml aPTT 66-102 seconds Monitor platelets by anticoagulation protocol: Yes   Plan:  Await instructions on when to resume UFH post-thoracentesis. At that  time, will resume heparin infusion of 1000 units/hr Check aPTT 6-8 hours after resuming CBC, heparin level/aPTT daily Once heparin level and aPTT correlate, monitor using heparin level only Monitor for signs of bleeding, worsening hemoptysis  Cindi Carbon, PharmD 10/18/23 11:46 AM

## 2023-10-18 NOTE — Progress Notes (Addendum)
Rebecca Ochoa   DOB:04-01-1961   ZO#:[id number]      ASSESSMENT & PLAN:  1.  Lung cancer stage IV with EGFR mutation, mets to bone, liver Patient admitted yesterday 10/17/2023 with complaints of shortness of breath which he noticed worsened over the 24 hours prior to admission.  Also complained of hemoptysis. -Patient last seen in office 10/05/23 by Dr. Shirline Frees medical oncology.  There was a plan to start systemic chemotherapy with carboplatin and Alimta, now on hold. -She is status post palliative radiation therapy, last dose 09/04/2023. -Patient is currently on oral chemotherapy/Tagrisso 80 mg p.o. daily, reports she did not take it yesterday or today. -CT angio done 10/17/2023 shows interval decrease in multiple bilateral lung nodules. -Follows outpatient oncology with Dr. Arbutus Ped, follow up upon discharge.  2.  Tonsil lesion, suspicious for malignancy -Lesion identified on pet scan -Has already been seen by ENT who advised against immediate intervention at this time.  Will reassess after long cancer control is better. -Will continue to monitor  3.  Anemia and thrombocytopenia -Likely due to oral chemotherapy/Tagrisso -Hemoglobin 8.0 and platelets 71K today -Transfuse PRBC for Hgb less than 7.0, no indication to transfuse at this time -Transfuse platelets for counts less than 20K or less than 50K with active bleeding, no indication to transfuse at this time  4.  Shortness of breath Pleural effusion/pleuritic chest pain -New small to moderate left pleural effusion per CT angio -Status post thoracocentesis done today 10/18/2023 -Patient reports breathing is much better and pain is much better -Monitor closely  5.  History of pulmonary embolism -On anticoagulation/Xarelto.  Currently on hold.   -Plan for heparin.  Monitor platelet counts, hold for platelets less than 50K. -Monitor closely for active bleeding   Code Status Full    Subjective:  Patient seen awake and alert sitting  up in bed, ill-appearing.  Reports that pleuritic chest pain is better and breathing is better.  No new acute complaints.  Objective:  Vitals:   10/18/23 0731 10/18/23 1200  BP:    Pulse: 90   Resp: (!) 24   Temp: 98.4 F (36.9 C) 98.1 F (36.7 C)  SpO2: 100%      Intake/Output Summary (Last 24 hours) at 10/18/2023 1436 Last data filed at 10/18/2023 1246 Gross per 24 hour  Intake 788.07 ml  Output 250 ml  Net 538.07 ml     REVIEW OF SYSTEMS:   Constitutional: +fatigue, Denies fevers, chills or abnormal night sweats Eyes: Denies blurriness of vision, double vision or watery eyes Ears, nose, mouth, throat, and face: Denies mucositis or sore throat Respiratory: + dyspnea  Cardiovascular: Denies palpitation,+ chest discomfort or lower extremity swelling Gastrointestinal:  Denies nausea, heartburn or change in bowel habits Skin: Denies abnormal skin rashes Lymphatics: Denies new lymphadenopathy or easy bruising Neurological: Denies numbness, tingling or new weaknesses Behavioral/Psych: Mood is stable, no new changes  All other systems were reviewed with the patient and are negative.  PHYSICAL EXAMINATION: ECOG PERFORMANCE STATUS: 2 - Symptomatic, <50% confined to bed  Vitals:   10/18/23 0731 10/18/23 1200  BP:    Pulse: 90   Resp: (!) 24   Temp: 98.4 F (36.9 C) 98.1 F (36.7 C)  SpO2: 100%    Filed Weights   10/17/23 0909 10/18/23 0000  Weight: 151 lb (68.5 kg) 150 lb 5.7 oz (68.2 kg)    GENERAL: +ill-appearing, alert, no distress and comfortable SKIN: +pale skin color, texture, turgor are normal, no rashes or significant lesions  EYES: normal, conjunctiva are pink and non-injected, sclera clear OROPHARYNX: no exudate, no erythema and lips, buccal mucosa, and tongue normal  NECK: supple, thyroid normal size, non-tender, without nodularity LYMPH: no palpable lymphadenopathy in the cervical, axillary or inguinal LUNGS: clear to auscultation and percussion with  normal breathing effort HEART: regular rate & rhythm and no murmurs and no lower extremity edema ABDOMEN: abdomen soft, non-tender and normal bowel sounds MUSCULOSKELETAL: no cyanosis of digits and no clubbing  PSYCH: alert & oriented x 3 with fluent speech NEURO: no focal motor/sensory deficits   All questions were answered. The patient knows to call the clinic with any problems, questions or concerns.   The total time spent in the appointment was 40 minutes encounter with patient including review of chart and various tests results, discussions about plan of care and coordination of care plan  Dawson Bills, NP 10/18/2023 2:36 PM    Labs Reviewed:  Lab Results  Component Value Date   WBC 8.7 10/18/2023   HGB 8.0 (L) 10/18/2023   HCT 26.4 (L) 10/18/2023   MCV 92.7 10/18/2023   PLT 71 (L) 10/18/2023   Recent Labs    08/16/23 1036 08/24/23 0819 09/21/23 0444 09/24/23 0500 10/05/23 0900 10/17/23 0931 10/17/23 0943 10/18/23 0345 10/18/23 1243  NA 133*   < > 137   < > 138 138 138 135  --   K 3.4*   < > 3.4*   < > 4.3 3.2* 3.2* 3.6  --   CL 93*   < > 105   < > 104 102 102 106  --   CO2 29   < > 23   < > 28 27  --  24  --   GLUCOSE 118*   < > 95   < > 147* 98 94 106*  --   BUN 16   < > 6*   < > 19 17 15 17   --   CREATININE 0.85   < > 0.59   < > 0.68 0.56 0.60 0.50  --   CALCIUM 9.0   < > 8.2*   < > 9.2 8.7*  --  8.1*  --   GFRNONAA >60   < > >60   < > >60 >60  --  >60  --   PROT 7.0   < >  --   --  6.4* 5.8*  --  5.0* 5.1*  ALBUMIN 3.3*   < >  --   --  3.5 3.0*  --  2.6*  --   AST 35   < >  --   --  69* 49*  --  36  --   ALT 25   < >  --   --  138* 106*  --  85*  --   ALKPHOS 91   < >  --   --  162* 117  --  100  --   BILITOT 1.0   < > 0.7  --  0.7 1.4*  --  1.3*  --   BILIDIR 0.3*  --  <0.1  --   --   --   --   --   --   IBILI 0.7  --  NOT CALCULATED  --   --   --   --   --   --    < > = values in this interval not displayed.    Studies Reviewed:  [facility] 1  View Result  Date: 10/18/2023 CLINICAL DATA:  Post thoracentesis. EXAM: PORTABLE CHEST 1 VIEW COMPARISON:  CT 10/17/2023.  Radiographs 10/17/2023 and 09/18/2023. FINDINGS: 1233 hours. Left IJ Port-A-Cath extends to the level of the upper right atrium. The heart size and mediastinal contours are stable. Right hilar mass and multiple pulmonary nodules in both lungs are grossly stable. Left pleural effusion may be slightly smaller. There is persistent asymmetric left lower lobe airspace disease. No evidence of pneumothorax. The bones appear unchanged. IMPRESSION: 1. No evidence of pneumothorax following thoracentesis. Left pleural effusion may be slightly smaller. 2. Stable right hilar mass and multiple pulmonary nodules consistent with known metastatic disease. Electronically Signed   By: Carey Bullocks M.D.   On: 10/18/2023 14:10   CT Angio Chest PE W and/or Wo Contrast Result Date: 10/17/2023 CLINICAL DATA:  Pulmonary embolism (PE) suspected, high prob. Shortness of breath and chest pain. * Tracking Code: BO * EXAM: CT ANGIOGRAPHY CHEST WITH CONTRAST TECHNIQUE: Multidetector CT imaging of the chest was performed using the standard protocol during bolus administration of intravenous contrast. Multiplanar CT image reconstructions and MIPs were obtained to evaluate the vascular anatomy. RADIATION DOSE REDUCTION: This exam was performed according to the departmental dose-optimization program which includes automated exposure control, adjustment of the mA and/or kV according to patient size and/or use of iterative reconstruction technique. CONTRAST:  75mL OMNIPAQUE IOHEXOL 350 MG/ML SOLN COMPARISON:  CT angiography chest from 09/12/2023. FINDINGS: Cardiovascular: Evaluation of pulmonary embolism beyond the segmental branches is limited due to patient's respiratory motion. There is no embolism to the segmental pulmonary artery level. Normal cardiac size. No pericardial effusion. No aortic aneurysm. There are  coronary artery calcifications, in keeping with coronary artery disease. There are also mild peripheral atherosclerotic vascular calcifications of thoracic aorta and its major branches. Mediastinum/Nodes: There is a 0.8 x 1.1 cm hypoattenuating nodule in the inferior right thyroid lobe, which is incompletely characterized on the current examination but appears unchanged since the prior study. Redemonstration of multiple enlarged hilar and mediastinal lymph nodes, compatible with metastases. There is mild interval decrease in the dominant right hilar nodal mass which currently measures 4.4 x 5.6 cm, previously 5.2 x 7.0 cm. No axillary lymphadenopathy by size criteria. The esophagus is nondistended precluding optimal assessment. Lungs/Pleura: There is linear filling defect along the lower portion of the lower trachea extending into the right main bronchus, likely mucus/secretion. The central tracheo-bronchial tree is otherwise patent. Since the prior study, there is new small-to-moderate left pleural effusion with associated compressive atelectatic changes. Redemonstration of multiple bilateral lobulated pulmonary masses with largest in the right lung apex measuring 3.5 x 4.6 cm, which previously measured 3.9 x 5.1 cm, when remeasured in similar fashion. The nodule exhibits new central cavitation/Re-aeration of lung parenchyma. No right pleural effusion. No pneumothorax on either side. Many nodules exhibit new surrounding ground-glass changes. Upper Abdomen: There are persistent ill-defined, hypoattenuating masses in the liver with largest in the left hepatic lobe, segment 2 measuring 5.0 x 8.1 cm. These are incompletely characterized on the current examination but highly concerning for metastases. There are ill-defined hypoattenuating lesions in the spleen as well, also favored to represent metastases. Remaining visualized upper abdominal viscera within normal limits. Musculoskeletal: A CT Port-a-Cath is seen in the  left upper chest wall with the catheter terminating in the cavo-atrial junction region. Visualized soft tissues of the chest wall are otherwise grossly unremarkable. No suspicious osseous lesions. There are mild multilevel degenerative changes in the visualized spine. Review of the MIP  images confirms the above findings. IMPRESSION: 1. No embolism to the segmental pulmonary artery level. 2. Interval decrease in the multiple bilateral lung nodules as well as hilar/mediastinal lymphadenopathy. Also there are new ground-glass changes surrounding multiple lung nodules, which may also represent posttreatment changes. Please see above for details. 3. There is new small-to-moderate left pleural effusion with associated compressive atelectatic changes. No right pleural effusion. 4. There are ill-defined hypoattenuating masses in the liver and spleen, highly concerning for metastases. Further imaging with dedicated contrast-enhanced MRI abdomen as per liver mass protocol is recommended. 5. Multiple other nonacute observations, as described above. Aortic Atherosclerosis (ICD10-I70.0). Electronically Signed   By: Jules Schick M.D.   On: 10/17/2023 13:41   DG Chest Port 1 View Result Date: 10/17/2023 CLINICAL DATA:  Dyspnea.  Shortness of breath and chest pain. EXAM: PORTABLE CHEST 1 VIEW COMPARISON:  09/18/2023. FINDINGS: Multiple lobular opacities are again seen throughout bilateral lungs, right more than left, compatible with patient's known history of metastases. There is interval decrease in the size of the lesions when compared to the prior radiograph from 09/18/2023. However, since the prior study, there is new left retrocardiac airspace opacity obscuring the left hemidiaphragm, descending thoracic aorta and blunting the left lateral costophrenic angle suggesting combination of left lower lobe atelectasis and/or consolidation with pleural effusion. Right lateral costophrenic angle is clear. Stable  cardio-mediastinal silhouette. No acute osseous abnormalities. The soft tissues are within normal limits. Redemonstration of left-sided CT Port-A-Cath with its tip overlying the cavoatrial junction region, unchanged. IMPRESSION: *New left retrocardiac opacity, as described above. *Interval decrease in the multiple lobular opacities throughout bilateral lungs, suggesting interval improvement in the metastases. Electronically Signed   By: Jules Schick M.D.   On: 10/17/2023 11:57   CT ANGIO HEAD NECK W WO CM Result Date: 09/23/2023 CLINICAL DATA:  62 year old female with headache. Small areas of left occipital lobe and left parietal lobe hemorrhage and edema on the MRI yesterday. History of lung cancer. EXAM: CT ANGIOGRAPHY HEAD AND NECK WITH AND WITHOUT CONTRAST TECHNIQUE: Multidetector CT imaging of the head and neck was performed using the standard protocol during bolus administration of intravenous contrast. Multiplanar CT image reconstructions and MIPs were obtained to evaluate the vascular anatomy. Carotid stenosis measurements (when applicable) are obtained utilizing NASCET criteria, using the distal internal carotid diameter as the denominator. RADIATION DOSE REDUCTION: This exam was performed according to the departmental dose-optimization program which includes automated exposure control, adjustment of the mA and/or kV according to patient size and/or use of iterative reconstruction technique. CONTRAST:  75mL OMNIPAQUE IOHEXOL 350 MG/ML SOLN COMPARISON:  Brain MRI and noncontrast head CT yesterday. FINDINGS: CT HEAD Brain: There remains no intracranial mass effect or ventriculomegaly. Mild left occipital pole edema is stable (series 4, image 17), but the left parietal findings and the underlying microhemorrhage at both areas is not apparent by CT. No new intracranial abnormality. Calvarium and skull base: No acute or suspicious osseous lesion. Paranasal sinuses: Visualized paranasal sinuses and mastoids  are stable and well aerated. Orbits: Leftward gaze, otherwise negative orbits and scalp. CTA NECK Skeleton: Age-appropriate cervical spine degeneration. No acute or suspicious osseous lesion. Upper chest: Upper lung mass is greater on the right. CT CTA chest 09/12/2023. Other neck: Heterogeneously enhancing left tonsillar pillar mass, 16 x 21 x 23 mm (AP by transverse by CC) at the level of the uvula. Left level 2 lymph nodes remain within normal limits. No parapharyngeal or retropharyngeal space inflammation. No other neck mass  or lymphadenopathy. Aortic arch: Calcified aortic atherosclerosis.  3 vessel arch. Right carotid system: Mild for age atherosclerosis and no stenosis. Left carotid system: Left CCA origin plaque without stenosis. Mild soft plaque in the left CCA and at the left ICA origin without significant stenosis. Vertebral arteries: Mild proximal right subclavian artery plaque without stenosis. Right vertebral artery origin calcified plaque with up to moderate stenosis series 13, image 71. Right vertebral remains patent to the skull base with no additional plaque or stenosis and is non dominant. Proximal left subclavian artery plaque without stenosis. Calcified plaque at the left vertebral artery origin but only mild stenosis on series 13, image 67. Dominant left vertebral. Mildly tortuous. No additional stenosis to the skull base. CTA HEAD Posterior circulation: Distal vertebral arteries and vertebrobasilar junction are patent without stenosis. Left V4 is dominant. Normal PICA origins. Patent basilar artery with calcified plaque on series 12, image 122. But no basilar stenosis. Patent SCA and left PCA origins. Fetal type right PCA origin. Bilateral PCA branches are within normal limits. Anterior circulation: Both ICA siphons are patent. Left siphon anterior genu calcified plaque with only mild stenosis. Right siphon cavernous segment calcified plaque with only mild stenosis. Normal right posterior  communicating artery origin. Patent carotid termini. Normal MCA and ACA origins. Dominant right A1. Normal anterior communicating artery. Bilateral ACA branches are within normal limits. Bilateral MCA branches are within normal limits. Venous sinuses: Patent. Anatomic variants: Dominant left vertebral artery. Fetal type right PCA origin. Dominant right A1. Other findings: No abnormal brain enhancement is identified. Review of the MIP images confirms the above findings IMPRESSION: 1. Upper lung masses as detailed on 09/12/2023 chest CTA. Superimposed left palatine tonsillar enhancing mass, up to 2.3 cm. No malignant cervical lymphadenopathy, but the constellation raises the possibility of metastatic head and neck cancer or synchronous lung and tonsillar primaries. 2. Negative for large vessel occlusion. 3. Up to moderate atherosclerotic stenosis of the non dominant Right Vertebral Artery. Dominant Left Vertebral Artery with no stenosis. 4. No other hemodynamically significant arterial stenosis. 5. Stable CT appearance of the brain since yesterday. No new intracranial abnormality. 6.  Aortic Atherosclerosis (ICD10-I70.0). Electronically Signed   By: Odessa Fleming M.D.   On: 09/23/2023 10:59   CT HEAD WO CONTRAST ( ) Result Date: 09/22/2023 CLINICAL DATA:  Seven and severe headache, metastatic disease suspected EXAM: CT HEAD WITHOUT CONTRAST TECHNIQUE: Contiguous axial images were obtained from the base of the skull through the vertex without intravenous contrast. RADIATION DOSE REDUCTION: This exam was performed according to the departmental dose-optimization program which includes automated exposure control, adjustment of the mA and/or kV according to patient size and/or use of iterative reconstruction technique. COMPARISON:  No prior CT head available 09/22/2023 MRI head FINDINGS: Brain: Small area of hypodensity in the left occipital lobe, with minimal hyperdensity, which likely correlates to the small focus of  hemorrhage with associated edema noted on the prior MRI. The smaller focus of edema in the left parietal lobe does not have as clear a correlate, but the hemorrhage may correlate with a small area of hyperdensity (series 6, image 33). No definite mass. No evidence of acute infarction, mass effect, or midline shift. No hydrocephalus. Vascular: No hyperdense vessel. Skull: Negative for fracture or focal lesion. Sinuses/Orbits: No acute finding. Other: The mastoid air cells are well aerated. IMPRESSION: 1. Small area of hypodensity in the left occipital lobe, with minimal hyperdensity, correlates with the small focus of hemorrhage and associated edema noted on the  prior MRI. 2. The smaller focus of edema in the left parietal lobe does not have as clear a correlate on CT, but the hemorrhage may correlate with a small area of hyperdensity. No definite mass. 3. Process. No additional acute intracranial Electronically Signed   By: Wiliam Ke M.D.   On: 09/22/2023 21:52   MR BRAIN W WO CONTRAST Result Date: 09/22/2023 CLINICAL DATA:  Headache EXAM: MRI HEAD WITHOUT AND WITH CONTRAST TECHNIQUE: Multiplanar, multiecho pulse sequences of the brain and surrounding structures were obtained without and with intravenous contrast. CONTRAST:  7mL GADAVIST GADOBUTROL 1 MMOL/ML IV SOLN COMPARISON:  Brain MR 08/05/2023 FINDINGS: Brain: Negative for an acute infarct. No hemorrhage. No hydrocephalus. No extra-axial fluid collection. There is a background of mild chronic microvascular ischemic change. Compared to prior exam there is a new focus of susceptibility artifact in the left occipital lobe with surrounding cerebral edema. There is no contrast enhancement associated with this site of hemorrhage, but an underlying metastatic lesion is not excluded. Additional similar focus of susceptibility artifact in the left parietal lobe (series 10, image 53). Recommend follow up contrast-enhanced brain MRI in 3 months to assess for  interval change. Vascular: Normal flow voids. Skull and upper cervical spine: Normal marrow signal. Sinuses/Orbits: Middle ear or mastoid effusion. Paranasal sinuses are clear. Orbits are unremarkable. Other: None. IMPRESSION: Compared to prior exam there are new small foci of hemorrhage in the left occipital lobe and left parietal lobe with surrounding cerebral edema. There is no contrast enhancement associated with sites. These are nonspecific, but metastatic disease is not excluded. Recommend follow up contrast-enhanced brain MRI in 3 months to assess for interval change. Electronically Signed   By: Lorenza Cambridge M.D.   On: 09/22/2023 17:19   US Abdomen Limited RUQ (LIVER/GB) Result Date: 09/18/2023 CLINICAL DATA:  Elevated liver function tests. EXAM: ULTRASOUND ABDOMEN LIMITED RIGHT UPPER QUADRANT COMPARISON:  None Available. FINDINGS: Gallbladder: The gallbladder is contracted. No gallstones or wall thickening visualized (1.9 mm). No sonographic Murphy sign noted by sonographer. Common bile duct: Diameter: 3.1 mm Liver: Heterogeneous echogenic masslike lesions are seen within the left lobe of the liver. The largest measures approximately 5.3 cm x 4.6 cm x 3.4 cm. Diffusely increased echogenicity of the liver parenchyma is noted. Portal vein is patent on color Doppler imaging with normal direction of blood flow towards the liver. Other: None. IMPRESSION: 1. Hepatic steatosis with multiple large heterogeneous liver lesions, concerning for the presence of hepatic metastasis. Electronically Signed   By: Aram Candela M.D.   On: 09/18/2023 21:53   ADDENDUM: Hematology/Oncology Attending: The patient is seen and examined today.  I agree with the above note.  She is a very pleasant 62 years old white female diagnosed with a stage IV non-small cell lung cancer with positive EGFR mutation [id number].  The patient is status post palliative radiotherapy to the large hilar and mediastinal mass and she started  treatment with oral Tagrisso 80 mg p.o. daily few weeks ago.  She has been tolerating her treatment well.  She was admitted to the hospital with shortness of breath and tachycardia.  She had CT angiogram of the chest performed yesterday that showed no evidence of pulmonary embolus and there was interval decrease in the multiple bilateral pulmonary nodules as well as the hilar and mediastinal lymphadenopathy consistent with response to treatment.  She has new small to moderate left pleural effusion with compressive atelectasis increase.  The patient was also found to have anemia  with hemoglobin of 7.8 and hematocrit 25.5%.  Her platelets count are low at 71,000 which is secondary to her treatment with Tagrisso. The patient is feeling a little bit better today.  I recommend for her to receive 2 units of PRBCs transfusion.  Will continue with supportive care for her anemia but no need for platelet transfusion at this point unless the patient has significant bleeding issues. She will need to resume her treatment with Tagrisso during her hospitalization. Thank you for taking good care of Ms. Gupta.  We will continue to follow-up the patient with you and assist in her management on as-needed basis. Disclaimer: This note was dictated with voice recognition software. Similar sounding words can inadvertently be transcribed and may be missed upon review. Lajuana Matte, MD

## 2023-10-18 NOTE — Plan of Care (Signed)
  Problem: Education: Goal: Knowledge of General Education information will improve Description: Including pain rating scale, medication(s)/side effects and non-pharmacologic comfort measures Outcome: Progressing   Problem: Health Behavior/Discharge Planning: Goal: Ability to manage health-related needs will improve Outcome: Progressing   Problem: Activity: Goal: Risk for activity intolerance will decrease Outcome: Progressing   Problem: Nutrition: Goal: Adequate nutrition will be maintained Outcome: Progressing   Problem: Pain Management: Goal: General experience of comfort will improve Outcome: Progressing

## 2023-10-19 ENCOUNTER — Other Ambulatory Visit: Payer: Self-pay | Admitting: Physician Assistant

## 2023-10-19 ENCOUNTER — Telehealth: Payer: Self-pay | Admitting: Internal Medicine

## 2023-10-19 ENCOUNTER — Inpatient Hospital Stay (HOSPITAL_COMMUNITY): Payer: No Typology Code available for payment source

## 2023-10-19 DIAGNOSIS — C778 Secondary and unspecified malignant neoplasm of lymph nodes of multiple regions: Secondary | ICD-10-CM | POA: Diagnosis not present

## 2023-10-19 DIAGNOSIS — J9601 Acute respiratory failure with hypoxia: Secondary | ICD-10-CM | POA: Diagnosis not present

## 2023-10-19 DIAGNOSIS — J90 Pleural effusion, not elsewhere classified: Secondary | ICD-10-CM

## 2023-10-19 DIAGNOSIS — C3490 Malignant neoplasm of unspecified part of unspecified bronchus or lung: Secondary | ICD-10-CM | POA: Diagnosis not present

## 2023-10-19 LAB — TSH: TSH: <0.01 u[IU]/mL — ABNORMAL LOW (ref 0.350–4.500)

## 2023-10-19 LAB — ACID FAST SMEAR (AFB, MYCOBACTERIA): Acid Fast Smear: NEGATIVE

## 2023-10-19 LAB — BASIC METABOLIC PANEL
Anion gap: 7 (ref 5–15)
BUN: 18 mg/dL (ref 8–23)
CO2: 27 mmol/L (ref 22–32)
Calcium: 8.7 mg/dL — ABNORMAL LOW (ref 8.9–10.3)
Chloride: 101 mmol/L (ref 98–111)
Creatinine, Ser: 0.48 mg/dL (ref 0.44–1.00)
GFR, Estimated: >60 mL/min (ref >60)
Glucose, Bld: 120 mg/dL — ABNORMAL HIGH (ref 70–99)
Potassium: 3.3 mmol/L — ABNORMAL LOW (ref 3.5–5.1)
Sodium: 135 mmol/L (ref 135–145)

## 2023-10-19 LAB — CBC
HCT: 23.5 % — ABNORMAL LOW (ref 36.0–46.0)
Hemoglobin: 7.1 g/dL — ABNORMAL LOW (ref 12.0–15.0)
MCH: 28.2 pg (ref 26.0–34.0)
MCHC: 30.2 g/dL (ref 30.0–36.0)
MCV: 93.3 fL (ref 80.0–100.0)
Platelets: 74 10*3/uL — ABNORMAL LOW (ref 150–400)
RBC: 2.52 MIL/uL — ABNORMAL LOW (ref 3.87–5.11)
RDW: 18.2 % — ABNORMAL HIGH (ref 11.5–15.5)
WBC: 7.4 10*3/uL (ref 4.0–10.5)
nRBC: 0 % (ref 0.0–0.2)

## 2023-10-19 LAB — HEPATIC FUNCTION PANEL
ALT: 61 U/L — ABNORMAL HIGH (ref 0–44)
AST: 26 U/L (ref 15–41)
Albumin: 2.6 g/dL — ABNORMAL LOW (ref 3.5–5.0)
Alkaline Phosphatase: 97 U/L (ref 38–126)
Bilirubin, Direct: 0.3 mg/dL — ABNORMAL HIGH (ref 0.0–0.2)
Indirect Bilirubin: 0.8 mg/dL (ref 0.3–0.9)
Total Bilirubin: 1.1 mg/dL (ref <1.2)
Total Protein: 5 g/dL — ABNORMAL LOW (ref 6.5–8.1)

## 2023-10-19 LAB — HEMOGLOBIN AND HEMATOCRIT, BLOOD
HCT: 32.9 % — ABNORMAL LOW (ref 36.0–46.0)
Hemoglobin: 10.7 g/dL — ABNORMAL LOW (ref 12.0–15.0)

## 2023-10-19 LAB — HAPTOGLOBIN: Haptoglobin: 156 mg/dL (ref 37–355)

## 2023-10-19 LAB — CYTOLOGY - NON PAP

## 2023-10-19 LAB — PREPARE RBC (CROSSMATCH)

## 2023-10-19 LAB — CORTISOL-AM, BLOOD: Cortisol - AM: 2.9 ug/dL — ABNORMAL LOW (ref 6.7–22.6)

## 2023-10-19 LAB — RHEUMATOID FACTOR: Rheumatoid fact SerPl-aCnc: <10 [IU]/mL (ref <14.0)

## 2023-10-19 MED ORDER — SACCHAROMYCES BOULARDII 250 MG PO CAPS
250.0000 mg | ORAL_CAPSULE | Freq: Two times a day (BID) | ORAL | Status: DC
  Administered 2023-10-19 – 2023-10-23 (×9): 250 mg via ORAL
  Filled 2023-10-19 (×9): qty 1

## 2023-10-19 MED ORDER — SODIUM CHLORIDE 0.9% IV SOLUTION
Freq: Once | INTRAVENOUS | Status: AC
Start: 2023-10-19 — End: 2023-10-19

## 2023-10-19 MED ORDER — POTASSIUM CHLORIDE CRYS ER 20 MEQ PO TBCR: 40.0000 meq | EXTENDED_RELEASE_TABLET | Freq: Once | ORAL | Status: DC

## 2023-10-19 MED ORDER — APIXABAN 5 MG PO TABS
5.0000 mg | ORAL_TABLET | Freq: Two times a day (BID) | ORAL | Status: DC
  Administered 2023-10-19 – 2023-10-23 (×9): 5 mg via ORAL
  Filled 2023-10-19 (×9): qty 1

## 2023-10-19 MED ORDER — VITAMIN B-12 1000 MCG PO TABS
500.0000 ug | ORAL_TABLET | Freq: Every day | ORAL | Status: DC
  Administered 2023-10-19 – 2023-10-23 (×5): 500 ug via ORAL
  Filled 2023-10-19 (×5): qty 1

## 2023-10-19 MED ORDER — LOPERAMIDE HCL 2 MG PO CAPS
2.0000 mg | ORAL_CAPSULE | Freq: Four times a day (QID) | ORAL | Status: AC | PRN
  Administered 2023-10-19 – 2023-10-20 (×2): 2 mg via ORAL
  Filled 2023-10-19 (×2): qty 1

## 2023-10-19 MED ORDER — FUROSEMIDE 10 MG/ML IJ SOLN
40.0000 mg | Freq: Once | INTRAMUSCULAR | Status: AC
  Administered 2023-10-19: 40 mg via INTRAVENOUS
  Filled 2023-10-19: qty 4

## 2023-10-19 MED ORDER — OSIMERTINIB MESYLATE 80 MG PO TABS
80.0000 mg | ORAL_TABLET | Freq: Every day | ORAL | Status: DC
  Administered 2023-10-19: 80 mg via ORAL
  Filled 2023-10-19 (×3): qty 1

## 2023-10-19 MED ORDER — SODIUM CHLORIDE 0.9 % IV SOLN
2.0000 g | INTRAVENOUS | Status: DC
  Administered 2023-10-19 – 2023-10-22 (×4): 2 g via INTRAVENOUS
  Filled 2023-10-19 (×4): qty 20

## 2023-10-19 MED ORDER — POTASSIUM CHLORIDE 20 MEQ PO PACK
40.0000 meq | PACK | Freq: Once | ORAL | Status: AC
  Administered 2023-10-19: 40 meq via ORAL
  Filled 2023-10-19: qty 2

## 2023-10-19 MED ORDER — DOXYCYCLINE HYCLATE 100 MG PO TABS
100.0000 mg | ORAL_TABLET | Freq: Two times a day (BID) | ORAL | Status: DC
  Administered 2023-10-19 – 2023-10-23 (×9): 100 mg via ORAL
  Filled 2023-10-19 (×9): qty 1

## 2023-10-19 NOTE — Progress Notes (Signed)
PHARMACIST - PHYSICIAN COMMUNICATION DR:   Sunnie Nielsen CONCERNING: Antibiotic IV to Oral Route Change Policy  RECOMMENDATION: This patient is receiving doxycycline by the intravenous route.  Based on criteria approved by the Pharmacy and Therapeutics Committee, the antibiotic(s) is/are being converted to the equivalent oral dose form(s).   DESCRIPTION: These criteria include: Patient being treated for a respiratory tract infection, urinary tract infection, cellulitis or clostridium difficile associated diarrhea if on metronidazole The patient is not neutropenic and does not exhibit a GI malabsorption state The patient is eating (either orally or via tube) and/or has been taking other orally administered medications for a least 24 hours The patient is improving clinically and has a Tmax < 100.5  Additionally, ongoing national IV fluid shortage.  Cindi Carbon, PharmD 10/19/23 11:16 AM

## 2023-10-19 NOTE — Telephone Encounter (Signed)
Patient's significant other is aware of scheduled appointment times/dates

## 2023-10-19 NOTE — Plan of Care (Signed)
  Problem: Education: Goal: Knowledge of General Education information will improve Description: Including pain rating scale, medication(s)/side effects and non-pharmacologic comfort measures Outcome: Progressing   Problem: Clinical Measurements: Goal: Respiratory complications will improve Outcome: Progressing Goal: Cardiovascular complication will be avoided Outcome: Progressing   

## 2023-10-19 NOTE — TOC Progression Note (Signed)
Transition of Care [facility]) - Progression Note    Patient Details  Name: Rebecca Ochoa MRN: [id number] Date of Birth: 24-Oct-1961  Transition of Care [facility]) CM/SW Contact  Geni Bers, RN Phone Number: 10/19/2023, 10:25 AM  Clinical Narrative:    Pt on BiPAP, from ome with spouse. TOC will continue to follow for discharge needs.   Expected Discharge Plan: Home/Self Care Barriers to Discharge: No Barriers Identified  Expected Discharge Plan and Services       Living arrangements for the past 2 months: Single Family Home                                       Social Determinants of Health (SDOH) Interventions SDOH Screenings   Food Insecurity: No Food Insecurity (10/17/2023)  Housing: High Risk (10/17/2023)  Transportation Needs: Unmet Transportation Needs (10/17/2023)  Utilities: Not At Risk (10/17/2023)  Depression (PHQ2-9): Low Risk  (06/04/2022)  Tobacco Use: High Risk (10/17/2023)    Readmission Risk Interventions    09/25/2023    2:00 PM  Readmission Risk Prevention Plan  Transportation Screening Complete  Medication Review (RN Care Manager) Referral to Pharmacy  PCP or Specialist appointment within 3-5 days of discharge Complete  HRI or Home Care Consult Complete  SW Recovery Care/Counseling Consult Complete  Palliative Care Screening Not Applicable  Skilled Nursing Facility Not Applicable

## 2023-10-19 NOTE — Progress Notes (Signed)
PROGRESS NOTE    Rebecca Ochoa  ZOX:[id number] DOB: September 23, 1961 DOA: 10/17/2023 PCP: [name], MD   Brief Narrative: 62 year old with past medical history significant for anemia, mild intermittent asthma, GERD, migraine headaches, hyperlipidemia, hypertension, vitamin D deficiency, history of PE on Xarelto, lung cancer undergoing palliative radiation and oral chemotherapy, but has noticed that it infusion chemotherapy due to insurance issues presented to the ED with dyspnea, pleuritic chest pain, hemoptysis, chest x-ray showed new left retrocardiac opacity.  Interval decrease in multiple lobular opacity throughout bilateral lungs suggestive of interval improvement of metastasis.  CT chest with no PE, interval decrease in multiple bilateral lung nodules as well as hilar mediastinal lymphadenopathy.  There are new groundglass changes surrounding multiple lung nodule which may represent posttreatment changes.  New small to moderate left pleural effusion with associated compressive atelectasis changes.  There are ill-defined hypoattenuation masses in the liver and spleen highly concerning for metastasis.  CCM consulted and patient had a left side thoracentesis yielding 700 cc of bloody fluid.    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  Bilateral pulmonary nodules again noted, most dominant in the right upper lung.  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 Dr.  Arbutus Ped following.     Hyperlipidemia: Not on meds out patient follow up.   Hypertension: Was not on BP medication.   Low Cortisol Was check because BP was soft at some point. BP improved. She was on decadron out patient could explain low dose cortisol.  She has been on decadron, unbale to proceed with ACTH test.    Low TSH; will proceed with free T 3 and Free T4.    Pulmonary embolism:  Eliquis resume today.  Doppler negative for DVT.  Thrombocytopenia;  Stable at 70  Monitor.   Anemia: Probably of malignancy.  Plan to transfuse 2 units PRBC> IV lasix in between  blood transfusion.  Low normal B12. Start supplement.   Diarrhea; start florastore.  Has had diarrhea from chemo.   Hypokalemia; replaced.     Estimated body mass index is 25.02 kg/m as calculated from the following:   Height as of this encounter: 5\' 5"  (1.651 m).   Weight as of this encounter: 68.2 kg.   DVT prophylaxis: eliquis Code Status: full code Family Communication: Husband at bedside Disposition Plan:  Status is: Inpatient Remains inpatient appropriate because: management   of Resp failure    Consultants:  CCM Dr Arbutus Ped  Procedures:  Thoracentesis. 12/18  Antimicrobials:    Subjective: She is alert , feels tired, no further hemoptysis.  SOB with minimal exertion.   Objective: Vitals:   10/19/23 0023 10/19/23 0242 10/19/23 0339 10/19/23 0543  BP: 132/75  123/67  133/61  Pulse: 90 81 76 78  Resp: 18 16 20 15   Temp: 98.2 F (36.8 C)  98 F (36.7 C)   TempSrc: Oral  Oral   SpO2: 99% 100% 99% 100%  Weight:      Height:        Intake/Output Summary (Last 24  hours) at 10/19/2023 0739 Last data filed at 10/19/2023 0518 Gross per 24 hour  Intake 933.82 ml  Output --  Net 933.82 ml   Filed Weights   10/17/23 0909 10/18/23 0000  Weight: 68.5 kg 68.2 kg    Examination:  General exam: Appears calm and comfortable  Respiratory system:BL crackles. Marland Kitchen Respiratory effort normal. Cardiovascular system: S1 & S2 heard, RRR.  Gastrointestinal system: Abdomen is nondistended, soft and nontender. No organomegaly or masses felt. Normal bowel sounds heard. Central nervous system: Alert and oriented. No focal neurological deficits. Extremities: Symmetric 5 x 5 power.   Data Reviewed: I have personally reviewed following labs and imaging studies  CBC: Recent Labs  Lab 10/17/23 0931 10/17/23 0943 10/18/23 0345 10/18/23 0644 10/18/23 1243 10/18/23 1622  WBC 12.2*  --  8.7  --   --  8.6  NEUTROABS 11.3*  --   --   --   --   --   HGB 9.5* 9.9* 7.8* 7.8* 8.0* 8.0*  HCT 31.1* 29.0* 25.5* 25.6* 26.4* 25.8*  MCV 92.0  --  92.7  --   --  92.1  PLT 94*  --  71*  --   --  76*   Basic Metabolic Panel: Recent Labs  Lab 10/17/23 0931 10/17/23 0943 10/18/23 0345  NA 138 138 135  K 3.2* 3.2* 3.6  CL 102 102 106  CO2 27  --  24  GLUCOSE 98 94 106*  BUN 17 15 17   CREATININE 0.56 0.60 0.50  CALCIUM 8.7*  --  8.1*  MG 2.4  --   --    GFR: Estimated Creatinine Clearance: 65.6 mL/min (by C-G formula based on SCr of 0.5 mg/dL). Liver Function Tests: Recent Labs  Lab 10/17/23 0931 10/18/23 0345 10/18/23 1243  AST 49* 36  --   ALT 106* 85*  --   ALKPHOS 117 100  --   BILITOT 1.4* 1.3*  --   PROT 5.8* 5.0* 5.1*  ALBUMIN 3.0* 2.6*  --    No results for input(s): "LIPASE", "AMYLASE" in the last 168 hours. No results for input(s): "AMMONIA" in the last 168 hours. Coagulation Profile: No results for input(s): "INR", "PROTIME" in the last 168 hours. Cardiac Enzymes: No results for input(s): "CKTOTAL", "CKMB", "CKMBINDEX", "TROPONINI" in the last  168 hours. BNP (last 3 results) No results for input(s): "PROBNP" in the last 8760 hours. HbA1C: No results for input(s): "HGBA1C" in the last 72 hours. CBG: No results for input(s): "GLUCAP" in the last 168 hours. Lipid Profile: Recent Labs    10/18/23 1243  CHOL 139   Thyroid Function Tests: No results for input(s): "TSH", "T4TOTAL", "FREET4", "T3FREE", "THYROIDAB" in the last 72 hours. Anemia Panel: Recent Labs    10/18/23 1243 10/18/23 1412  VITAMINB12  --  281  FOLATE  --  >40.0  FERRITIN  --  650*  TIBC  --  228*  IRON  --  58  RETICCTPCT 5.3*  --    Sepsis Labs: Recent Labs  Lab 10/18/23 0345  PROCALCITON <0.10    Recent Results (from the past 240 hours)  Resp panel by RT-PCR (RSV,  Flu A&B, Covid) Anterior Nasal Swab     Status: None   Collection Time: 10/17/23  9:31 AM   Specimen: Anterior Nasal Swab  Result Value Ref Range Status   SARS Coronavirus 2 by RT PCR NEGATIVE NEGATIVE Final    Comment: (NOTE) SARS-CoV-2 target nucleic acids are NOT DETECTED.  The SARS-CoV-2 RNA is generally detectable in upper respiratory specimens during the acute phase of infection. The lowest concentration of SARS-CoV-2 viral copies this assay can detect is 138 copies/mL. A negative result does not preclude SARS-Cov-2 infection and should not be used as the sole basis for treatment or other patient management decisions. A negative result may occur with  improper specimen collection/handling, submission of specimen other than nasopharyngeal swab, presence of viral mutation(s) within the areas targeted by this assay, and inadequate number of viral copies(<138 copies/mL). A negative result must be combined with clinical observations, patient history, and epidemiological information. The expected result is Negative.  Fact Sheet for Patients:  BloggerCourse.com  Fact Sheet for Healthcare Providers:  SeriousBroker.it  This  test is no t yet approved or cleared by the [place] FDA and  has been authorized for detection and/or diagnosis of SARS-CoV-2 by FDA under an Emergency Use Authorization (EUA). This EUA will remain  in effect (meaning this test can be used) for the duration of the COVID-19 declaration under Section 564(b)(1) of the Act, 21 U.S.C.section 360bbb-3(b)(1), unless the authorization is terminated  or revoked sooner.       Influenza A by PCR NEGATIVE NEGATIVE Final   Influenza B by PCR NEGATIVE NEGATIVE Final    Comment: (NOTE) The Xpert Xpress SARS-CoV-2/FLU/RSV plus assay is intended as an aid in the diagnosis of influenza from Nasopharyngeal swab specimens and should not be used as a sole basis for treatment. Nasal washings and aspirates are unacceptable for Xpert Xpress SARS-CoV-2/FLU/RSV testing.  Fact Sheet for Patients: BloggerCourse.com  Fact Sheet for Healthcare Providers: SeriousBroker.it  This test is not yet approved or cleared by the [place] FDA and has been authorized for detection and/or diagnosis of SARS-CoV-2 by FDA under an Emergency Use Authorization (EUA). This EUA will remain in effect (meaning this test can be used) for the duration of the COVID-19 declaration under Section 564(b)(1) of the Act, 21 U.S.C. section 360bbb-3(b)(1), unless the authorization is terminated or revoked.     Resp Syncytial Virus by PCR NEGATIVE NEGATIVE Final    Comment: (NOTE) Fact Sheet for Patients: BloggerCourse.com  Fact Sheet for Healthcare Providers: SeriousBroker.it  This test is not yet approved or cleared by the [place] FDA and has been authorized for detection and/or diagnosis of SARS-CoV-2 by FDA under an Emergency Use Authorization (EUA). This EUA will remain in effect (meaning this test can be used) for the duration of the COVID-19 declaration under  Section 564(b)(1) of the Act, 21 U.S.C. section 360bbb-3(b)(1), unless the authorization is terminated or revoked.  Performed at [facility], [address], [place], [place] [address]   Respiratory (~20 pathogens) panel by PCR     Status: None   Collection Time: 10/17/23  9:31 AM   Specimen: Nasopharyngeal Swab; Respiratory  Result Value Ref Range Status   Adenovirus NOT DETECTED NOT DETECTED Final   Coronavirus 229E NOT DETECTED NOT DETECTED Final    Comment: (NOTE) The Coronavirus on the Respiratory Panel, DOES NOT test for the novel  Coronavirus (2019 nCoV)    Coronavirus HKU1 NOT DETECTED NOT DETECTED Final   Coronavirus NL63  NOT DETECTED NOT DETECTED Final   Coronavirus OC43 NOT DETECTED NOT DETECTED Final   Metapneumovirus NOT DETECTED NOT DETECTED Final   Rhinovirus / Enterovirus NOT DETECTED NOT DETECTED Final   Influenza A NOT DETECTED NOT DETECTED Final   Influenza B NOT DETECTED NOT DETECTED Final   Parainfluenza Virus 1 NOT DETECTED NOT DETECTED Final   Parainfluenza Virus 2 NOT DETECTED NOT DETECTED Final   Parainfluenza Virus 3 NOT DETECTED NOT DETECTED Final   Parainfluenza Virus 4 NOT DETECTED NOT DETECTED Final   Respiratory Syncytial Virus NOT DETECTED NOT DETECTED Final   Bordetella pertussis NOT DETECTED NOT DETECTED Final   Bordetella Parapertussis NOT DETECTED NOT DETECTED Final   Chlamydophila pneumoniae NOT DETECTED NOT DETECTED Final   Mycoplasma pneumoniae NOT DETECTED NOT DETECTED Final    Comment: Performed at [facility], [address], [place], [place] [address]  MRSA Next Gen by PCR, Nasal     Status: None   Collection Time: 10/17/23 11:56 PM   Specimen: Nasal Mucosa; Nasal Swab  Result Value Ref Range Status   MRSA by PCR Next Gen NOT DETECTED NOT DETECTED Final    Comment: (NOTE) The GeneXpert MRSA Assay (FDA approved for NASAL specimens only), is one component of a comprehensive MRSA colonization  surveillance program. It is not intended to diagnose MRSA infection nor to guide or monitor treatment for MRSA infections. Test performance is not FDA approved in patients less than 68 years old. Performed at [facility], [address], [place], [place] [address]   Body fluid culture w Gram Stain     Status: None (Preliminary result)   Collection Time: 10/18/23 11:56 AM   Specimen: Pleura; Body Fluid  Result Value Ref Range Status   Specimen Description   Final    PLEURAL Performed at [facility], [address], [place], [place] [address]    Special Requests   Final    NONE Performed at [facility], [address] [place], [place] [address]    Gram Stain   Final    RARE WBC PRESENT,BOTH PMN AND MONONUCLEAR NO ORGANISMS SEEN Performed at [facility], [address], [place], [place] [address]    Culture PENDING  Incomplete   Report Status PENDING  Incomplete         Radiology Studies: DG Chest 1 View Result Date: 10/18/2023 CLINICAL DATA:  Hemothorax, acute respiratory failure with hypoxia. EXAM: CHEST  1 VIEW COMPARISON:  10/18/2023. FINDINGS: The heart size and mediastinal contours are stable. There is atherosclerotic calcification of the aorta. A left chest port is stable in position. Scattered opacities are noted in the lungs bilaterally and at the right hilum, similar in appearance to the prior exam. There is a small left pleural effusion. No pneumothorax is seen. Bony structures appear stable. IMPRESSION: 1. Small left pleural effusion. 2. Remaining findings are unchanged. Electronically Signed   By: Thornell Sartorius M.D.   On: 10/18/2023 21:54   VAS [place] LOWER EXTREMITY VENOUS (DVT) Result Date: 10/18/2023  Lower Venous DVT Study Patient Name:  SAMAI ALMADA  Date of Exam:   10/18/2023 Medical Rec #: [id number]     Accession #:    [id number] Date of Birth: 01-Aug-1961      Patient Gender: F Patient Age:   43  years Exam Location:  [facility] Procedure:      VAS [place] LOWER EXTREMITY VENOUS (DVT) Referring Phys: Zenia Resides --------------------------------------------------------------------------------  Indications: Follow up exam.  Risk Factors: Metastatic lung cancer / chemotherapy. Anticoagulation: Eliquis (outpatient) & heparin (inpatient). Comparison Study: Previous exam on 09/05/2023 was positive for DVT of LLE (popv,                   ptv, and perov) Performing Technologist: Jody Hill RVT, RDMS  Examination Guidelines: A complete evaluation includes B-mode imaging, spectral Doppler, color Doppler, and power Doppler as needed of all accessible portions of each vessel. Bilateral testing is considered an integral part of a complete examination. Limited examinations for reoccurring indications may be performed as noted. The reflux portion of the exam is performed with the patient in reverse Trendelenburg.  +---------+---------------+---------+-----------+----------+--------------+ RIGHT    CompressibilityPhasicitySpontaneityPropertiesThrombus Aging +---------+---------------+---------+-----------+----------+--------------+ CFV      Full           Yes      Yes                                 +---------+---------------+---------+-----------+----------+--------------+ SFJ      Full                                                        +---------+---------------+---------+-----------+----------+--------------+ FV Prox  Full           Yes      Yes                                 +---------+---------------+---------+-----------+----------+--------------+ FV Mid   Full           Yes      Yes                                 +---------+---------------+---------+-----------+----------+--------------+ FV DistalFull           Yes      Yes                                 +---------+---------------+---------+-----------+----------+--------------+ PFV      Full                                                         +---------+---------------+---------+-----------+----------+--------------+ POP      Full           Yes      Yes                                 +---------+---------------+---------+-----------+----------+--------------+ PTV      Full                                                        +---------+---------------+---------+-----------+----------+--------------+ PERO     Full                                                        +---------+---------------+---------+-----------+----------+--------------+   +---------+---------------+---------+-----------+----------+--------------+  LEFT     CompressibilityPhasicitySpontaneityPropertiesThrombus Aging +---------+---------------+---------+-----------+----------+--------------+ CFV      Full           Yes      Yes                                 +---------+---------------+---------+-----------+----------+--------------+ SFJ      Full                                                        +---------+---------------+---------+-----------+----------+--------------+ FV Prox  Full           Yes      Yes                                 +---------+---------------+---------+-----------+----------+--------------+ FV Mid   Full           Yes      Yes                                 +---------+---------------+---------+-----------+----------+--------------+ FV DistalFull           Yes      Yes                                 +---------+---------------+---------+-----------+----------+--------------+ PFV      Full                                                        +---------+---------------+---------+-----------+----------+--------------+ POP      Full           Yes      Yes                                 +---------+---------------+---------+-----------+----------+--------------+ PTV      Full                                                         +---------+---------------+---------+-----------+----------+--------------+ PERO     Full                                                        +---------+---------------+---------+-----------+----------+--------------+    Summary: BILATERAL: - No evidence of deep vein thrombosis seen in the lower extremities, bilaterally. -No evidence of popliteal cyst, bilaterally.  LEFT: - Findings suggest resolution of previously noted thrombus.  *See table(s) above for measurements and observations.    Preliminary    DG Chest Port 1 View Result Date: 10/18/2023 CLINICAL DATA:  Post thoracentesis. EXAM: PORTABLE CHEST  1 VIEW COMPARISON:  CT 10/17/2023.  Radiographs 10/17/2023 and 09/18/2023. FINDINGS: 1233 hours. Left IJ Port-A-Cath extends to the level of the upper right atrium. The heart size and mediastinal contours are stable. Right hilar mass and multiple pulmonary nodules in both lungs are grossly stable. Left pleural effusion may be slightly smaller. There is persistent asymmetric left lower lobe airspace disease. No evidence of pneumothorax. The bones appear unchanged. IMPRESSION: 1. No evidence of pneumothorax following thoracentesis. Left pleural effusion may be slightly smaller. 2. Stable right hilar mass and multiple pulmonary nodules consistent with known metastatic disease. Electronically Signed   By: Carey Bullocks M.D.   On: 10/18/2023 14:10   CT Angio Chest PE W and/or Wo Contrast Result Date: 10/17/2023 CLINICAL DATA:  Pulmonary embolism (PE) suspected, high prob. Shortness of breath and chest pain. * Tracking Code: BO * EXAM: CT ANGIOGRAPHY CHEST WITH CONTRAST TECHNIQUE: Multidetector CT imaging of the chest was performed using the standard protocol during bolus administration of intravenous contrast. Multiplanar CT image reconstructions and MIPs were obtained to evaluate the vascular anatomy. RADIATION DOSE REDUCTION: This exam was performed according to the departmental dose-optimization  program which includes automated exposure control, adjustment of the mA and/or kV according to patient size and/or use of iterative reconstruction technique. CONTRAST:  75mL OMNIPAQUE IOHEXOL 350 MG/ML SOLN COMPARISON:  CT angiography chest from 09/12/2023. FINDINGS: Cardiovascular: Evaluation of pulmonary embolism beyond the segmental branches is limited due to patient's respiratory motion. There is no embolism to the segmental pulmonary artery level. Normal cardiac size. No pericardial effusion. No aortic aneurysm. There are coronary artery calcifications, in keeping with coronary artery disease. There are also mild peripheral atherosclerotic vascular calcifications of thoracic aorta and its major branches. Mediastinum/Nodes: There is a 0.8 x 1.1 cm hypoattenuating nodule in the inferior right thyroid lobe, which is incompletely characterized on the current examination but appears unchanged since the prior study. Redemonstration of multiple enlarged hilar and mediastinal lymph nodes, compatible with metastases. There is mild interval decrease in the dominant right hilar nodal mass which currently measures 4.4 x 5.6 cm, previously 5.2 x 7.0 cm. No axillary lymphadenopathy by size criteria. The esophagus is nondistended precluding optimal assessment. Lungs/Pleura: There is linear filling defect along the lower portion of the lower trachea extending into the right main bronchus, likely mucus/secretion. The central tracheo-bronchial tree is otherwise patent. Since the prior study, there is new small-to-moderate left pleural effusion with associated compressive atelectatic changes. Redemonstration of multiple bilateral lobulated pulmonary masses with largest in the right lung apex measuring 3.5 x 4.6 cm, which previously measured 3.9 x 5.1 cm, when remeasured in similar fashion. The nodule exhibits new central cavitation/Re-aeration of lung parenchyma. No right pleural effusion. No pneumothorax on either side. Many  nodules exhibit new surrounding ground-glass changes. Upper Abdomen: There are persistent ill-defined, hypoattenuating masses in the liver with largest in the left hepatic lobe, segment 2 measuring 5.0 x 8.1 cm. These are incompletely characterized on the current examination but highly concerning for metastases. There are ill-defined hypoattenuating lesions in the spleen as well, also favored to represent metastases. Remaining visualized upper abdominal viscera within normal limits. Musculoskeletal: A CT Port-a-Cath is seen in the left upper chest wall with the catheter terminating in the cavo-atrial junction region. Visualized soft tissues of the chest wall are otherwise grossly unremarkable. No suspicious osseous lesions. There are mild multilevel degenerative changes in the visualized spine. Review of the MIP images confirms the above findings. IMPRESSION: 1. No embolism to  the segmental pulmonary artery level. 2. Interval decrease in the multiple bilateral lung nodules as well as hilar/mediastinal lymphadenopathy. Also there are new ground-glass changes surrounding multiple lung nodules, which may also represent posttreatment changes. Please see above for details. 3. There is new small-to-moderate left pleural effusion with associated compressive atelectatic changes. No right pleural effusion. 4. There are ill-defined hypoattenuating masses in the liver and spleen, highly concerning for metastases. Further imaging with dedicated contrast-enhanced MRI abdomen as per liver mass protocol is recommended. 5. Multiple other nonacute observations, as described above. Aortic Atherosclerosis (ICD10-I70.0). Electronically Signed   By: Jules Schick M.D.   On: 10/17/2023 13:41   DG Chest Port 1 View Result Date: 10/17/2023 CLINICAL DATA:  Dyspnea.  Shortness of breath and chest pain. EXAM: PORTABLE CHEST 1 VIEW COMPARISON:  09/18/2023. FINDINGS: Multiple lobular opacities are again seen throughout bilateral lungs,  right more than left, compatible with patient's known history of metastases. There is interval decrease in the size of the lesions when compared to the prior radiograph from 09/18/2023. However, since the prior study, there is new left retrocardiac airspace opacity obscuring the left hemidiaphragm, descending thoracic aorta and blunting the left lateral costophrenic angle suggesting combination of left lower lobe atelectasis and/or consolidation with pleural effusion. Right lateral costophrenic angle is clear. Stable cardio-mediastinal silhouette. No acute osseous abnormalities. The soft tissues are within normal limits. Redemonstration of left-sided CT Port-A-Cath with its tip overlying the cavoatrial junction region, unchanged. IMPRESSION: *New left retrocardiac opacity, as described above. *Interval decrease in the multiple lobular opacities throughout bilateral lungs, suggesting interval improvement in the metastases. Electronically Signed   By: Jules Schick M.D.   On: 10/17/2023 11:57        Scheduled Meds:  Chlorhexidine Gluconate Cloth  6 each Topical Daily   dexamethasone  4 mg Oral Q12H   Followed by   Melene Muller ON 10/23/2023] dexamethasone  4 mg Oral Daily   Followed by   Melene Muller ON 10/30/2023] dexamethasone  2 mg Oral Daily   feeding supplement  237 mL Oral BID BM   folic acid  1 mg Oral Daily   pantoprazole  40 mg Oral BID   Continuous Infusions:  doxycycline (VIBRAMYCIN) IV Stopped (10/18/23 2335)   piperacillin-tazobactam (ZOSYN)  IV Stopped (10/19/23 0457)     LOS: 2 days    Time spent: 35 minutes    Honor Fairbank A Vestal Markin, MD [facility]   If 7PM-7AM, please contact night-coverage [email or web address]  10/19/2023, 7:39 AM

## 2023-10-19 NOTE — Progress Notes (Signed)
NAME:  Rebecca Ochoa, MRN:  [id number], DOB:  03-28-1961, LOS: 2 ADMISSION DATE:  10/17/2023, CONSULTATION DATE: 10/17/23  REFERRING MD: Robb Matar CHIEF COMPLAINT:  Shortness of Breath   History of Present Illness:  Pt is a 62 yr old female with a significant past medical hx of metastatic lung cancer (diagnosed 2 months ago), recent PE (6 weeks ago) on eliquis, anemia, asthma, HTN, and GERD. Patient also had a recent hospitalization on 09/18/23 for fever, headache and leukocytosis where she was treated with antibiotics but MRI of head revealed left parietal microhemorrhages possibly related to metastatic disease vs subacute infarct with hemorrhagic conversion vs eliquis use. Patient followed up with Neurology outpatient on 12/11 in regards to brain bleed and per note seemed to be in usual state of health but with shortness of breath with exertion and deconditioning attributed to metastatic cancer. Patient presented to [facility] by EMS for worsening shortness of breath at rest but mostly with exertion. Patient's O2 sats were 90-94% on RA but with tachypnea and labored breathing. Patient was ultimately placed on BIPAP for WOB but patient could not tolerate due to anxiety and was placed on Heated HFNC instead.   Initial ED work up revealed, chest x-ray showing some decrease in lobar opacities indicating interval improvement of metastases, with left lower lobe atelectasis vs consolidation with pleural effusion, CT angio showed no PE, small left pleural effusion with atelectasis, and evidence of metastatic disease.  Negative for flu, covid, RSV PCR, WBC 12.2, hgb 9.5, and platelets 94, Trops and BNP within normal range. [facility] called to admit due dyspnea with PCCM consult to assist with increased WOB/hypoxia.   Upon assessment, patient that shortness of breath increased yesterday with some coughing evening on 12/16. Patient endorses that shortness of breath increases with exertion. Patient denies any recent sick contacts,  fevers, chills, n/v, hematemesis, hematochezia. Patient began coughing up dark, blood tinged sputum in ED. Patient reports that she is currently taking Tagrisso po daily and oral steroids for brain swelling but has not started systemic chemotherapy at this time. Since taking Tagrisso, patient has had issues with diarrhea but no other side effects.   Pertinent  Medical History   Past Medical History:  Diagnosis Date   Anemia    Asthma, mild intermittent, well-controlled    GERD (gastroesophageal reflux disease)    Hx of migraines    Hyperlipidemia    Hypertension    Vitamin D deficiency      Significant Hospital Events: Including procedures, antibiotic start and stop dates in addition to other pertinent events   12/17 Adm with [facility], PCCM consulted due to increase WOB and hemoptysis.  CT chest showed mild dec in size of bilateral lung nodules, as well as the hilar adenopathy, there was new small left pleural effusion and the mets to liver and spleen again noted. Not initially entirely clear what was driving her inc WOB.  12/18 COVID neg, BNP 84.8, neg trop I , RVP negative, MRSA neg PCR, POC [place] w/ mod-lg left effusion. Hemoptysis improved. Thora  LT >> 700 cc  Interim History / Subjective:   Afebrile Sitting up in bed, on nasal cannula. Tachycardic with minimal exertion  Objective   Blood pressure 121/66, pulse 99, temperature 98.1 F (36.7 C), temperature source Oral, resp. rate 17, height 5\' 5"  (1.651 m), weight 68.2 kg, SpO2 99%.        Intake/Output Summary (Last 24 hours) at 10/19/2023 0918 Last data filed at 10/19/2023 0518 Gross per 24  hour  Intake 933.82 ml  Output --  Net 933.82 ml   Filed Weights   10/17/23 0909 10/18/23 0000  Weight: 68.5 kg 68.2 kg    Examination:  General  sitting up in bed, no distress on nasal cannula HENT NCAT no JVD MMM Pulm crackles left base, decreased breath sounds on right Card rrr Abd soft Ext warm and dry  Neuro awake, oriented.   Nonfocal  Labs show mild hypokalemia, slight drop in hemoglobin to 7.1, no leukocytosis. 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 Favoring PNA  Plan Ok to  resume DOAC  Empiric antibiotics  Left pleural effusion -neutrophilic exudate, Gram stain negative. 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 FCCP. [facility] Pager : [phone]  If no response to pager , please call [phone] until 7 pm After 7:00 pm call Elink  [phone]   10/19/2023

## 2023-10-19 NOTE — Plan of Care (Signed)
  Problem: Education: Goal: Knowledge of General Education information will improve Description: Including pain rating scale, medication(s)/side effects and non-pharmacologic comfort measures Outcome: Progressing   Problem: Clinical Measurements: Goal: Ability to maintain clinical measurements within normal limits will improve Outcome: Progressing Goal: Cardiovascular complication will be avoided Outcome: Progressing   Problem: Activity: Goal: Risk for activity intolerance will decrease Outcome: Progressing   Problem: Elimination: Goal: Will not experience complications related to urinary retention Outcome: Progressing   Problem: Pain Management: Goal: General experience of comfort will improve Outcome: Progressing   Problem: Clinical Measurements: Goal: Ability to maintain a body temperature in the normal range will improve Outcome: Progressing   Problem: Respiratory: Goal: Ability to maintain adequate ventilation will improve Outcome: Progressing

## 2023-10-20 ENCOUNTER — Inpatient Hospital Stay: Payer: No Typology Code available for payment source | Admitting: Internal Medicine

## 2023-10-20 ENCOUNTER — Inpatient Hospital Stay: Payer: No Typology Code available for payment source

## 2023-10-20 ENCOUNTER — Inpatient Hospital Stay: Payer: No Typology Code available for payment source | Admitting: Nurse Practitioner

## 2023-10-20 ENCOUNTER — Inpatient Hospital Stay (HOSPITAL_COMMUNITY): Payer: No Typology Code available for payment source

## 2023-10-20 ENCOUNTER — Ambulatory Visit: Payer: No Typology Code available for payment source

## 2023-10-20 DIAGNOSIS — J9601 Acute respiratory failure with hypoxia: Secondary | ICD-10-CM | POA: Diagnosis not present

## 2023-10-20 DIAGNOSIS — J90 Pleural effusion, not elsewhere classified: Secondary | ICD-10-CM | POA: Diagnosis not present

## 2023-10-20 DIAGNOSIS — C778 Secondary and unspecified malignant neoplasm of lymph nodes of multiple regions: Secondary | ICD-10-CM | POA: Diagnosis not present

## 2023-10-20 DIAGNOSIS — C3490 Malignant neoplasm of unspecified part of unspecified bronchus or lung: Secondary | ICD-10-CM | POA: Diagnosis not present

## 2023-10-20 LAB — BASIC METABOLIC PANEL
Anion gap: 7 (ref 5–15)
BUN: 23 mg/dL (ref 8–23)
CO2: 27 mmol/L (ref 22–32)
Calcium: 8.7 mg/dL — ABNORMAL LOW (ref 8.9–10.3)
Chloride: 98 mmol/L (ref 98–111)
Creatinine, Ser: 0.45 mg/dL (ref 0.44–1.00)
GFR, Estimated: >60 mL/min (ref >60)
Glucose, Bld: 106 mg/dL — ABNORMAL HIGH (ref 70–99)
Potassium: 3.4 mmol/L — ABNORMAL LOW (ref 3.5–5.1)
Sodium: 132 mmol/L — ABNORMAL LOW (ref 135–145)

## 2023-10-20 LAB — TYPE AND SCREEN
ABO/RH(D): B POS
Antibody Screen: NEGATIVE
Unit division: 0
Unit division: 0

## 2023-10-20 LAB — CBC
HCT: 30.3 % — ABNORMAL LOW (ref 36.0–46.0)
Hemoglobin: 10.1 g/dL — ABNORMAL LOW (ref 12.0–15.0)
MCH: 30.2 pg (ref 26.0–34.0)
MCHC: 33.3 g/dL (ref 30.0–36.0)
MCV: 90.7 fL (ref 80.0–100.0)
Platelets: 61 10*3/uL — ABNORMAL LOW (ref 150–400)
RBC: 3.34 MIL/uL — ABNORMAL LOW (ref 3.87–5.11)
RDW: 17.2 % — ABNORMAL HIGH (ref 11.5–15.5)
WBC: 8 10*3/uL (ref 4.0–10.5)
nRBC: 0.4 % — ABNORMAL HIGH (ref 0.0–0.2)

## 2023-10-20 LAB — BPAM RBC
Blood Product Expiration Date: 202412252359
Blood Product Expiration Date: 202501132359
ISSUE DATE / TIME: 202412191207
ISSUE DATE / TIME: 202412191616
Unit Type and Rh: 1700
Unit Type and Rh: 1700

## 2023-10-20 LAB — T4, FREE: Free T4: 1.11 ng/dL (ref 0.61–1.12)

## 2023-10-20 MED ORDER — POTASSIUM CHLORIDE CRYS ER 20 MEQ PO TBCR
40.0000 meq | EXTENDED_RELEASE_TABLET | Freq: Once | ORAL | Status: AC
  Administered 2023-10-20: 40 meq via ORAL
  Filled 2023-10-20: qty 2

## 2023-10-20 MED ORDER — LOPERAMIDE HCL 2 MG PO CAPS
2.0000 mg | ORAL_CAPSULE | ORAL | Status: DC | PRN
  Administered 2023-10-20 – 2023-10-23 (×7): 2 mg via ORAL
  Filled 2023-10-20 (×7): qty 1

## 2023-10-20 MED ORDER — FUROSEMIDE 10 MG/ML IJ SOLN
40.0000 mg | Freq: Two times a day (BID) | INTRAMUSCULAR | Status: DC
  Administered 2023-10-20: 40 mg via INTRAVENOUS
  Filled 2023-10-20: qty 4

## 2023-10-20 MED ORDER — POTASSIUM CHLORIDE CRYS ER 20 MEQ PO TBCR: 40.0000 meq | EXTENDED_RELEASE_TABLET | Freq: Once | ORAL | Status: DC

## 2023-10-20 MED ORDER — ALTEPLASE 2 MG IJ SOLR
2.0000 mg | Freq: Once | INTRAMUSCULAR | Status: DC
  Filled 2023-10-20: qty 2

## 2023-10-20 MED ORDER — SALINE SPRAY 0.65 % NA SOLN
1.0000 | NASAL | Status: DC | PRN
  Administered 2023-10-20: 1 via NASAL
  Filled 2023-10-20: qty 44

## 2023-10-20 MED ORDER — OSIMERTINIB MESYLATE 80 MG PO TABS
80.0000 mg | ORAL_TABLET | Freq: Every day | ORAL | Status: DC
  Administered 2023-10-20 – 2023-10-22 (×3): 80 mg via ORAL
  Filled 2023-10-20 (×3): qty 1

## 2023-10-20 NOTE — Progress Notes (Signed)
PROGRESS NOTE    Rebecca Ochoa  ZJI:[id number] DOB: 03/26/1961 DOA: 10/17/2023 PCP: [name], MD   Brief Narrative: 62 year old with past medical history significant for anemia, mild intermittent asthma, GERD, migraine headaches, hyperlipidemia, hypertension, vitamin D deficiency, history of PE on Xarelto, lung cancer undergoing palliative radiation and oral chemotherapy, but has noticed that it infusion chemotherapy due to insurance issues presented to the ED with dyspnea, pleuritic chest pain, hemoptysis, chest x-ray showed new left retrocardiac opacity.  Interval decrease in multiple lobular opacity throughout bilateral lungs suggestive of interval improvement of metastasis.  CT chest with no PE, interval decrease in multiple bilateral lung nodules as well as hilar mediastinal lymphadenopathy.  There are new groundglass changes surrounding multiple lung nodule which may represent posttreatment changes.  New small to moderate left pleural effusion with associated compressive atelectasis changes.  There are ill-defined hypoattenuation masses in the liver and spleen highly concerning for metastasis.  CCM consulted and patient had a left side thoracentesis yielding 700 cc of bloody fluid.    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 Bilateral pulmonary nodules again noted, most dominant in the right upper lung. Continue to have SOB on minimal exertion, repeat chest x ray and will give a dose of IV lasix.   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 Dr.  Arbutus Ped following.     Hyperlipidemia: Not on meds. out patient follow up.   Hypertension: Was not on BP medication.  BP fluctuates. Monitor.   Low Cortisol Was check because BP was soft at some point. BP improved. She was on decadron out patient could explain low dose cortisol.  She has been on decadron, unbale to proceed with ACTH test.   Low TSH; will proceed with free T 3 pending. and Free T4. 1.1   Pulmonary embolism:  Eliquis resume 12/19. Doppler negative for DVT.  Thrombocytopenia;  70 --61 Monitor.   Anemia: Probably of malignancy.  Received 2 units PRBC> 12/19. Hb increase to 10  Low normal B12. Started  supplement.   Diarrhea; Started florastore.  Has had diarrhea from chemo.  Start imodium   Hypokalemia; replaced.     Estimated body mass index is 25.02 kg/m as calculated from the following:   Height as of this encounter: 5\' 5"  (1.651 m).   Weight as of this encounter: 68.2 kg.   DVT prophylaxis: eliquis Code Status: full code Family Communication: Brother at bedside Disposition Plan:  Status is: Inpatient Remains inpatient appropriate because: management   of Resp failure    Consultants:  CCM Dr Arbutus Ped  Procedures:  Thoracentesis. 12/18  Antimicrobials:    Subjective: She is SOB with minimal exertion  getting to bedside commode.  She is not feeling better.    Objective: Vitals:   10/20/23 0503 10/20/23 0600 10/20/23 0700 10/20/23 0719  BP: (!) 151/76     Pulse: 89 68 87 79  Resp: (!) 23 (!) 25 (!) 27  (!) 23  Temp: 98.3 F (36.8 C)   98 F (36.7 C)  TempSrc: Oral   Oral  SpO2: 97% 98% 98% 98%  Weight:      Height:        Intake/Output Summary (Last 24 hours) at 10/20/2023 0749 Last data filed at 10/19/2023 1834 Gross per 24 hour  Intake 736.28 ml  Output 500 ml  Net 236.28 ml   Filed Weights   10/17/23 0909 10/18/23 0000  Weight: 68.5 kg 68.2 kg    Examination:  General exam: NAD Respiratory system: BL crackles.  Cardiovascular system: BS present, soft nt Gastrointestinal system: BS present, soft, nt Extremities: trace edema   Data Reviewed: I have personally reviewed following labs and imaging studies  CBC: Recent Labs  Lab 10/17/23 0931 10/17/23 0943 10/18/23 0345 10/18/23 0644 10/18/23 1243 10/18/23 1622 10/19/23 0712 10/19/23 2120 10/20/23 0515  WBC 12.2*  --  8.7  --   --  8.6 7.4  --  8.0  NEUTROABS 11.3*  --   --   --   --   --   --   --   --   HGB 9.5*   < > 7.8*   < > 8.0* 8.0* 7.1* 10.7* 10.1*  HCT 31.1*   < > 25.5*   < > 26.4* 25.8* 23.5* 32.9* 30.3*  MCV 92.0  --  92.7  --   --  92.1 93.3  --  90.7  PLT 94*  --  71*  --   --  76* 74*  --  61*   < > = values in this interval not displayed.   Basic Metabolic Panel: Recent Labs  Lab 10/17/23 0931 10/17/23 0943 10/18/23 0345 10/19/23 0712  NA 138 138 135 135  K 3.2* 3.2* 3.6 3.3*  CL 102 102 106 101  CO2 27  --  24 27  GLUCOSE 98 94 106* 120*  BUN 17 15 17 18   CREATININE 0.56 0.60 0.50 0.48  CALCIUM 8.7*  --  8.1* 8.7*  MG 2.4  --   --   --    GFR: Estimated Creatinine Clearance: 65.6 mL/min (by C-G formula based on SCr of 0.48 mg/dL). Liver Function Tests: Recent Labs  Lab 10/17/23 0931 10/18/23 0345 10/18/23 1243 10/19/23 0712  AST 49* 36  --  26  ALT 106* 85*  --  61*  ALKPHOS 117 100  --  97  BILITOT 1.4* 1.3*  --  1.1  PROT 5.8* 5.0* 5.1* 5.0*  ALBUMIN 3.0* 2.6*  --  2.6*   No results for input(s): "LIPASE", "AMYLASE" in the last 168 hours. No results for input(s):  "AMMONIA" in the last 168 hours. Coagulation Profile: No results for input(s): "INR", "PROTIME" in the last 168 hours. Cardiac Enzymes: No results for input(s): "CKTOTAL", "CKMB", "CKMBINDEX", "TROPONINI" in the last 168 hours. BNP (last 3 results) No results for input(s): "PROBNP" in the last 8760 hours. HbA1C: No results for input(s): "HGBA1C" in the last 72 hours. CBG: No results for input(s): "GLUCAP" in the last 168 hours. Lipid Profile: Recent Labs    10/18/23 1243  CHOL 139   Thyroid Function Tests: Recent Labs    10/19/23 4098  TSH <0.010*   Anemia Panel: Recent Labs    10/18/23 1243 10/18/23 1412  VITAMINB12  --  281  FOLATE  --  >40.0  FERRITIN  --  650*  TIBC  --  228*  IRON  --  58  RETICCTPCT 5.3*  --    Sepsis Labs: Recent Labs  Lab 10/18/23 0345  PROCALCITON <0.10    Recent Results (from the past 240 hours)  Resp panel by RT-PCR (RSV, Flu A&B, Covid) Anterior Nasal Swab     Status: None   Collection Time: 10/17/23  9:31 AM   Specimen: Anterior Nasal Swab  Result Value Ref Range Status   SARS Coronavirus 2 by RT PCR NEGATIVE NEGATIVE Final    Comment: (NOTE) SARS-CoV-2 target nucleic acids are NOT DETECTED.  The SARS-CoV-2 RNA is generally detectable in upper respiratory specimens during the acute phase of infection. The lowest concentration of SARS-CoV-2 viral copies this assay can detect is 138 copies/mL. A negative result does not preclude SARS-Cov-2 infection and should not be used as the sole basis for treatment or other patient management decisions. A negative result may occur with  improper specimen collection/handling, submission of specimen other than nasopharyngeal swab, presence of viral mutation(s) within the areas targeted by this assay, and inadequate number of viral copies(<138 copies/mL). A negative result must be combined with clinical observations, patient history, and epidemiological information. The expected result is  Negative.  Fact Sheet for Patients:  BloggerCourse.com  Fact Sheet for Healthcare Providers:  SeriousBroker.it  This test is no t yet approved or cleared by the [place] FDA and  has been authorized for detection and/or diagnosis of SARS-CoV-2 by FDA under an Emergency Use Authorization (EUA). This EUA will remain  in effect (meaning this test can be used) for the duration of the COVID-19 declaration under Section 564(b)(1) of the Act, 21 U.S.C.section 360bbb-3(b)(1), unless the authorization is terminated  or revoked sooner.       Influenza A by PCR NEGATIVE NEGATIVE Final   Influenza B by PCR NEGATIVE NEGATIVE Final    Comment: (NOTE) The Xpert Xpress SARS-CoV-2/FLU/RSV plus assay is intended as an aid in the diagnosis of influenza from Nasopharyngeal swab specimens and should not be used as a sole basis for treatment. Nasal washings and aspirates are unacceptable for Xpert Xpress SARS-CoV-2/FLU/RSV testing.  Fact Sheet for Patients: BloggerCourse.com  Fact Sheet for Healthcare Providers: SeriousBroker.it  This test is not yet approved or cleared by the [place] FDA and has been authorized for detection and/or diagnosis of SARS-CoV-2 by FDA under an Emergency Use Authorization (EUA). This EUA will remain in effect (meaning this test can be used) for the duration of the COVID-19 declaration under Section 564(b)(1) of the Act, 21 U.S.C. section 360bbb-3(b)(1), unless the authorization is terminated or revoked.     Resp Syncytial Virus by PCR NEGATIVE NEGATIVE Final    Comment: (NOTE) Fact Sheet for Patients: BloggerCourse.com  Fact Sheet for Healthcare Providers: SeriousBroker.it  This test is not yet approved or cleared by the [place] FDA and has been authorized for detection and/or diagnosis of  SARS-CoV-2 by FDA under an Emergency Use Authorization (EUA). This EUA will remain in effect (meaning this test can be used) for the duration of the COVID-19 declaration under Section 564(b)(1) of the Act, 21 U.S.C. section 360bbb-3(b)(1), unless the authorization is terminated or revoked.  Performed at [facility], [address], [place], [place] [address]   Respiratory (~20 pathogens) panel  by PCR     Status: None   Collection Time: 10/17/23  9:31 AM   Specimen: Nasopharyngeal Swab; Respiratory  Result Value Ref Range Status   Adenovirus NOT DETECTED NOT DETECTED Final   Coronavirus 229E NOT DETECTED NOT DETECTED Final    Comment: (NOTE) The Coronavirus on the Respiratory Panel, DOES NOT test for the novel  Coronavirus (2019 nCoV)    Coronavirus HKU1 NOT DETECTED NOT DETECTED Final   Coronavirus NL63 NOT DETECTED NOT DETECTED Final   Coronavirus OC43 NOT DETECTED NOT DETECTED Final   Metapneumovirus NOT DETECTED NOT DETECTED Final   Rhinovirus / Enterovirus NOT DETECTED NOT DETECTED Final   Influenza A NOT DETECTED NOT DETECTED Final   Influenza B NOT DETECTED NOT DETECTED Final   Parainfluenza Virus 1 NOT DETECTED NOT DETECTED Final   Parainfluenza Virus 2 NOT DETECTED NOT DETECTED Final   Parainfluenza Virus 3 NOT DETECTED NOT DETECTED Final   Parainfluenza Virus 4 NOT DETECTED NOT DETECTED Final   Respiratory Syncytial Virus NOT DETECTED NOT DETECTED Final   Bordetella pertussis NOT DETECTED NOT DETECTED Final   Bordetella Parapertussis NOT DETECTED NOT DETECTED Final   Chlamydophila pneumoniae NOT DETECTED NOT DETECTED Final   Mycoplasma pneumoniae NOT DETECTED NOT DETECTED Final    Comment: Performed at [facility], [address], [place], [place] [address]  MRSA Next Gen by PCR, Nasal     Status: None   Collection Time: 10/17/23 11:56 PM   Specimen: Nasal Mucosa; Nasal Swab  Result Value Ref Range Status   MRSA by PCR Next Gen NOT DETECTED  NOT DETECTED Final    Comment: (NOTE) The GeneXpert MRSA Assay (FDA approved for NASAL specimens only), is one component of a comprehensive MRSA colonization surveillance program. It is not intended to diagnose MRSA infection nor to guide or monitor treatment for MRSA infections. Test performance is not FDA approved in patients less than 31 years old. Performed at [facility], [address] [place], [place] [address]   Body fluid culture w Gram Stain     Status: None (Preliminary result)   Collection Time: 10/18/23 11:56 AM   Specimen: Pleura; Body Fluid  Result Value Ref Range Status   Specimen Description   Final    PLEURAL Performed at [facility], [address], [place], [place] [address]    Special Requests   Final    NONE Performed at [facility], [address] [place], [place] [address]    Gram Stain   Final    RARE WBC PRESENT,BOTH PMN AND MONONUCLEAR NO ORGANISMS SEEN    Culture   Final    NO GROWTH < 24 HOURS Performed at [facility], [address], [place], [place] [address]    Report Status PENDING  Incomplete  Acid Fast Smear (AFB)     Status: None   Collection Time: 10/18/23 11:59 AM   Specimen: Pleural, Left; Pleural Fluid  Result Value Ref Range Status   AFB Specimen Processing Concentration  Final   Acid Fast Smear Negative  Final    Comment: (NOTE) Performed At: [facility] [address] [place], [place] [id number] Jolene Schimke MD XB:[id number]    Source (AFB) PLEURAL  Final    Comment: Performed at [facility], [address] [place], [place] [address]         Radiology Studies: VAS [place] LOWER EXTREMITY VENOUS (DVT) Result Date: 10/19/2023  Lower  Venous DVT Study Patient Name:  SHANISA BONICA  Date of Exam:   10/18/2023 Medical Rec #: [id number]     Accession #:    [id number] Date of Birth: Jan 25, 1961      Patient Gender: F Patient Age:   69 years  Exam Location:  [facility] Procedure:      VAS [place] LOWER EXTREMITY VENOUS (DVT) Referring Phys: Zenia Resides --------------------------------------------------------------------------------  Indications: Follow up exam.  Risk Factors: Metastatic lung cancer / chemotherapy. Anticoagulation: Eliquis (outpatient) & heparin (inpatient). Comparison Study: Previous exam on 09/05/2023 was positive for DVT of LLE (popv,                   ptv, and perov) Performing Technologist: Jody Hill RVT, RDMS  Examination Guidelines: A complete evaluation includes B-mode imaging, spectral Doppler, color Doppler, and power Doppler as needed of all accessible portions of each vessel. Bilateral testing is considered an integral part of a complete examination. Limited examinations for reoccurring indications may be performed as noted. The reflux portion of the exam is performed with the patient in reverse Trendelenburg.  +---------+---------------+---------+-----------+----------+--------------+ RIGHT    CompressibilityPhasicitySpontaneityPropertiesThrombus Aging +---------+---------------+---------+-----------+----------+--------------+ CFV      Full           Yes      Yes                                 +---------+---------------+---------+-----------+----------+--------------+ SFJ      Full                                                        +---------+---------------+---------+-----------+----------+--------------+ FV Prox  Full           Yes      Yes                                 +---------+---------------+---------+-----------+----------+--------------+ FV Mid   Full           Yes      Yes                                 +---------+---------------+---------+-----------+----------+--------------+ FV DistalFull           Yes      Yes                                 +---------+---------------+---------+-----------+----------+--------------+ PFV      Full                                                         +---------+---------------+---------+-----------+----------+--------------+ POP      Full           Yes      Yes                                 +---------+---------------+---------+-----------+----------+--------------+  PTV      Full                                                        +---------+---------------+---------+-----------+----------+--------------+ PERO     Full                                                        +---------+---------------+---------+-----------+----------+--------------+   +---------+---------------+---------+-----------+----------+--------------+ LEFT     CompressibilityPhasicitySpontaneityPropertiesThrombus Aging +---------+---------------+---------+-----------+----------+--------------+ CFV      Full           Yes      Yes                                 +---------+---------------+---------+-----------+----------+--------------+ SFJ      Full                                                        +---------+---------------+---------+-----------+----------+--------------+ FV Prox  Full           Yes      Yes                                 +---------+---------------+---------+-----------+----------+--------------+ FV Mid   Full           Yes      Yes                                 +---------+---------------+---------+-----------+----------+--------------+ FV DistalFull           Yes      Yes                                 +---------+---------------+---------+-----------+----------+--------------+ PFV      Full                                                        +---------+---------------+---------+-----------+----------+--------------+ POP      Full           Yes      Yes                                 +---------+---------------+---------+-----------+----------+--------------+ PTV      Full                                                         +---------+---------------+---------+-----------+----------+--------------+  PERO     Full                                                        +---------+---------------+---------+-----------+----------+--------------+     Summary: BILATERAL: - No evidence of deep vein thrombosis seen in the lower extremities, bilaterally. -No evidence of popliteal cyst, bilaterally.  LEFT: - Findings suggest resolution of previously noted thrombus.  *See table(s) above for measurements and observations. Electronically signed by Heath Lark on 10/19/2023 at 10:41:03 AM.    Final    DG Chest Port 1 View Result Date: 10/19/2023 CLINICAL DATA:  Pleural effusion. EXAM: PORTABLE CHEST 1 VIEW COMPARISON:  10/18/2023 FINDINGS: The cardio pericardial silhouette is enlarged. Cardiopericardial silhouette is at upper limits of normal for size. Left-sided Port-A-Cath again noted. Asymmetric elevation right hemidiaphragm with persistent left base atelectasis or infiltrate. Bilateral pulmonary nodules again noted, most dominant in the right upper lung. Bones are diffusely demineralized. IMPRESSION: 1. No substantial change given differential positioning. Persistent left base atelectasis or infiltrate with effusion. 2. Bilateral pulmonary nodules again noted, most dominant in the right upper lung. Electronically Signed   By: Kennith Center M.D.   On: 10/19/2023 08:44   DG Chest 1 View Result Date: 10/18/2023 CLINICAL DATA:  Hemothorax, acute respiratory failure with hypoxia. EXAM: CHEST  1 VIEW COMPARISON:  10/18/2023. FINDINGS: The heart size and mediastinal contours are stable. There is atherosclerotic calcification of the aorta. A left chest port is stable in position. Scattered opacities are noted in the lungs bilaterally and at the right hilum, similar in appearance to the prior exam. There is a small left pleural effusion. No pneumothorax is seen. Bony structures appear stable. IMPRESSION: 1. Small left pleural effusion. 2.  Remaining findings are unchanged. Electronically Signed   By: Thornell Sartorius M.D.   On: 10/18/2023 21:54   DG Chest Port 1 View Result Date: 10/18/2023 CLINICAL DATA:  Post thoracentesis. EXAM: PORTABLE CHEST 1 VIEW COMPARISON:  CT 10/17/2023.  Radiographs 10/17/2023 and 09/18/2023. FINDINGS: 1233 hours. Left IJ Port-A-Cath extends to the level of the upper right atrium. The heart size and mediastinal contours are stable. Right hilar mass and multiple pulmonary nodules in both lungs are grossly stable. Left pleural effusion may be slightly smaller. There is persistent asymmetric left lower lobe airspace disease. No evidence of pneumothorax. The bones appear unchanged. IMPRESSION: 1. No evidence of pneumothorax following thoracentesis. Left pleural effusion may be slightly smaller. 2. Stable right hilar mass and multiple pulmonary nodules consistent with known metastatic disease. Electronically Signed   By: Carey Bullocks M.D.   On: 10/18/2023 14:10        Scheduled Meds:  apixaban  5 mg Oral BID   Chlorhexidine Gluconate Cloth  6 each Topical Daily   vitamin B-12  500 mcg Oral Daily   dexamethasone  4 mg Oral Q12H   Followed by   Melene Muller ON 10/23/2023] dexamethasone  4 mg Oral Daily   Followed by   Melene Muller ON 10/30/2023] dexamethasone  2 mg Oral Daily   doxycycline  100 mg Oral Q12H   feeding supplement  237 mL Oral BID BM   folic acid  1 mg Oral Daily   osimertinib mesylate  80 mg Oral Daily   pantoprazole  40 mg Oral BID   saccharomyces boulardii  250  mg Oral BID   Continuous Infusions:  cefTRIAXone (ROCEPHIN)  IV Stopped (10/19/23 1603)     LOS: 3 days    Time spent: 35 minutes    Carley Glendenning A Koren Plyler, MD [facility]   If 7PM-7AM, please contact night-coverage [email or web address]  10/20/2023, 7:49 AM

## 2023-10-20 NOTE — Progress Notes (Signed)
PT Cancellation Note  Patient Details Name: Rebecca Ochoa MRN: [id number] DOB: 11-15-1960   Cancelled Treatment:    Reason Eval/Treat Not Completed: Other (comment)  Checked on pt at 1730 and she was awaiting food and reports would rather wait till tomorrow.  RN reports has been up to [facility] and to transport today and becomes tachycardiac.  Will f/u tomorrow.  [name], PT Acute Rehab [facility] [phone]  Rayetta Humphrey 10/20/2023, 5:34 PM

## 2023-10-20 NOTE — Progress Notes (Signed)
   10/20/23 1256  Oxygen Therapy/Pulse Ox  O2 Device (S)  Nasal Cannula (Changed to a regular [place] @ 3 L.)  O2 Therapy Oxygen  O2 Flow Rate (L/min) 3 L/min  FiO2 (%) 32 %  SpO2 98 %  Safety Instructions Yes (Comment)

## 2023-10-21 DIAGNOSIS — J9601 Acute respiratory failure with hypoxia: Secondary | ICD-10-CM | POA: Diagnosis not present

## 2023-10-21 LAB — T3, FREE: T3, Free: 2 pg/mL (ref 2.0–4.4)

## 2023-10-21 LAB — BASIC METABOLIC PANEL
Anion gap: 7 (ref 5–15)
BUN: 29 mg/dL — ABNORMAL HIGH (ref 8–23)
CO2: 29 mmol/L (ref 22–32)
Calcium: 8.8 mg/dL — ABNORMAL LOW (ref 8.9–10.3)
Chloride: 103 mmol/L (ref 98–111)
Creatinine, Ser: 0.54 mg/dL (ref 0.44–1.00)
GFR, Estimated: >60 mL/min (ref >60)
Glucose, Bld: 119 mg/dL — ABNORMAL HIGH (ref 70–99)
Potassium: 3.8 mmol/L (ref 3.5–5.1)
Sodium: 139 mmol/L (ref 135–145)

## 2023-10-21 LAB — CBC
HCT: 30.1 % — ABNORMAL LOW (ref 36.0–46.0)
Hemoglobin: 9.7 g/dL — ABNORMAL LOW (ref 12.0–15.0)
MCH: 29.8 pg (ref 26.0–34.0)
MCHC: 32.2 g/dL (ref 30.0–36.0)
MCV: 92.6 fL (ref 80.0–100.0)
Platelets: 66 10*3/uL — ABNORMAL LOW (ref 150–400)
RBC: 3.25 MIL/uL — ABNORMAL LOW (ref 3.87–5.11)
RDW: 17.8 % — ABNORMAL HIGH (ref 11.5–15.5)
WBC: 6.6 10*3/uL (ref 4.0–10.5)
nRBC: 0 % (ref 0.0–0.2)

## 2023-10-21 MED ORDER — SODIUM CHLORIDE 0.9% FLUSH
10.0000 mL | Freq: Two times a day (BID) | INTRAVENOUS | Status: DC
  Administered 2023-10-21: 40 mL
  Administered 2023-10-21: 10 mL
  Administered 2023-10-21: 40 mL
  Administered 2023-10-22 – 2023-10-23 (×3): 10 mL

## 2023-10-21 NOTE — Plan of Care (Signed)
  Problem: Education: Goal: Knowledge of General Education information will improve Description: Including pain rating scale, medication(s)/side effects and non-pharmacologic comfort measures Outcome: Progressing   Problem: Clinical Measurements: Goal: Ability to maintain clinical measurements within normal limits will improve Outcome: Progressing Goal: Cardiovascular complication will be avoided Outcome: Progressing   Problem: Activity: Goal: Risk for activity intolerance will decrease Outcome: Progressing   Problem: Nutrition: Goal: Adequate nutrition will be maintained Outcome: Progressing   Problem: Coping: Goal: Level of anxiety will decrease Outcome: Progressing   Problem: Elimination: Goal: Will not experience complications related to bowel motility Outcome: Progressing Goal: Will not experience complications related to urinary retention Outcome: Progressing   Problem: Pain Management: Goal: General experience of comfort will improve Outcome: Progressing   Problem: Skin Integrity: Goal: Risk for impaired skin integrity will decrease Outcome: Progressing   Problem: Health Behavior/Discharge Planning: Goal: Ability to manage health-related needs will improve Outcome: Not Progressing   Problem: Clinical Measurements: Goal: Respiratory complications will improve Outcome: Not Progressing

## 2023-10-21 NOTE — Evaluation (Signed)
Occupational Therapy Evaluation Patient Details Name: Rebecca Ochoa MRN: [id number] DOB: Sep 26, 1961 Today's Date: 10/21/2023   History of Present Illness Patient is a 62 year old female who presented to the hospital on 12/17 with dyspnea and pleuritis chest pain. CT was negative for PE but noted to have new small to moderate left pleural effusion with associated compressive atelectasis changes. Patient was admitted with acute hypoxic respiratory failure, asthma, PNA,.patient underwent thoracentesis yielding 700ccs.  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   Clinical Impression   Patient is a 62 year old female who was admitted for above. Patient was living at home with some support from significant other. Currently, patient was CGA for functional mobility and transfers in room with mod A for LB dressing tasks. Patients HR up to 133 bpm during activity with standing rest breaks. Patient was noted to have decreased functional activity tolerance, decreased endurance, decreased standing balance, decreased safety awareness, and decreased knowledge of AD/AE impacting participation in ADLs. Patient plans to transition home with HH at time of d/c.  Patient would continue to benefit from skilled OT services at this time while admitted and after d/c to address noted deficits in order to improve overall safety and independence in ADLs.        If plan is discharge home, recommend the following: A little help with walking and/or transfers;A little help with bathing/dressing/bathroom;Assistance with cooking/housework;Direct supervision/assist for medications management;Assist for transportation;Direct supervision/assist for financial management;Help with stairs or ramp for entrance    Functional Status Assessment  Patient has had a recent decline in their functional status and demonstrates the ability to make significant improvements in function in a reasonable and predictable amount of  time.  Equipment Recommendations  None recommended by OT       Precautions / Restrictions Precautions Precautions: Fall Precaution Comments: monitor O2, Restrictions Weight Bearing Restrictions Per Provider Order: No      Mobility Bed Mobility Overal bed mobility: Needs Assistance Bed Mobility: Supine to Sit     Supine to sit: Contact guard, HOB elevated              Balance Overall balance assessment: Mild deficits observed, not formally tested           ADL either performed or assessed with clinical judgement   ADL Overall ADL's : Needs assistance/impaired Eating/Feeding: Modified independent;Sitting   Grooming: Sitting;Set up   Upper Body Bathing: Set up;Sitting   Lower Body Bathing: Moderate assistance;Sitting/lateral leans Lower Body Bathing Details (indicate cue type and reason): able to figure four position but reporting legs feeling heavy looking like there is some minimal edema in BLE Upper Body Dressing : Set up;Sitting   Lower Body Dressing: Moderate assistance;Sitting/lateral leans   Toilet Transfer: Contact guard assist;Ambulation;Rolling walker (2 wheels) Toilet Transfer Details (indicate cue type and reason): able to transition into hallway with increased time. patient noted to have 97% on 2L/min and HR 130 bpm during mobility. Toileting- Clothing Manipulation and Hygiene: Sit to/from stand;Maximal assistance Toileting - Clothing Manipulation Details (indicate cue type and reason): patient reporting she wants to try to use real bathroom later and help with hygiene. declining to trial now. nurse and NT made aware.             Vision   Vision Assessment?: No apparent visual deficits            Pertinent Vitals/Pain Pain Assessment Pain Assessment: No/denies pain     Extremity/Trunk Assessment  Upper Extremity Assessment Upper Extremity Assessment: Overall WFL for tasks assessed   Lower Extremity Assessment Lower Extremity  Assessment: Defer to PT evaluation   Cervical / Trunk Assessment Cervical / Trunk Assessment: Normal      Cognition Arousal: Alert Behavior During Therapy: WFL for tasks assessed/performed Overall Cognitive Status: Within Functional Limits for tasks assessed           General Comments: plesant and cooperative                Home Living Family/patient expects to be discharged to:: Private residence Living Arrangements: Spouse/significant other Available Help at Discharge: Family;Available 24 hours/day Type of Home: House Home Access: Stairs to enter [employer] of Steps: 3   Home Layout: One level     Bathroom Shower/Tub: Sponge bathes at baseline (in half bath downstairs.)         Home Equipment: Rollator (4 wheels);Shower seat   Additional Comments: supplemental O2, sleep in recliner,      Prior Functioning/Environment Prior Level of Function : Needs assist               ADLs Comments: patient has help from significant other for ADLs as needed.        OT Problem List: Decreased activity tolerance;Impaired balance (sitting and/or standing);Decreased coordination;Decreased safety awareness;Decreased knowledge of precautions;Decreased knowledge of use of DME or AE;Cardiopulmonary status limiting activity      OT Treatment/Interventions: Self-care/ADL training;Therapeutic exercise;DME and/or AE instruction;Therapeutic activities;Patient/family education;Balance training    OT Goals(Current goals can be found in the care plan section) Acute Rehab OT Goals Patient Stated Goal: to go home OT Goal Formulation: With patient Time For Goal Achievement: 11/04/23 Potential to Achieve Goals: Fair  OT Frequency: Min 1X/week    Co-evaluation PT/OT/SLP Co-Evaluation/Treatment: Yes Reason for Co-Treatment: To address functional/ADL transfers PT goals addressed during session: Mobility/safety with mobility OT goals addressed during session: ADL's and  self-care      AM-PAC OT "6 Clicks" Daily Activity     Outcome Measure Help from another person eating meals?: None Help from another person taking care of personal grooming?: A Little Help from another person toileting, which includes using toliet, bedpan, or urinal?: A Lot Help from another person bathing (including washing, rinsing, drying)?: A Lot Help from another person to put on and taking off regular upper body clothing?: A Little Help from another person to put on and taking off regular lower body clothing?: A Lot 6 Click Score: 16   End of Session Equipment Utilized During Treatment: Gait belt;Rolling walker (2 wheels) Nurse Communication: Mobility status  Activity Tolerance: Patient tolerated treatment well Patient left: in chair;with call bell/phone within reach  OT Visit Diagnosis: Unsteadiness on feet (R26.81);Other abnormalities of gait and mobility (R26.89)                Time: 9629-5284 OT Time Calculation (min): 23 min Charges:  OT General Charges $OT Visit: 1 Visit OT Evaluation $OT Eval Low Complexity: 1 Low  Joya Willmott OTR/L, MS Acute Rehabilitation Department Office# [phone]   Selinda Flavin 10/21/2023, 4:29 PM

## 2023-10-21 NOTE — Progress Notes (Signed)
PROGRESS NOTE    Rebecca Ochoa  VHQ:[id number] DOB: October 02, 1961 DOA: 10/17/2023 PCP: [name], MD   Brief Narrative: 62 year old with past medical history significant for anemia, mild intermittent asthma, GERD, migraine headaches, hyperlipidemia, hypertension, vitamin D deficiency, history of PE on Xarelto, lung cancer undergoing palliative radiation and oral chemotherapy, but has noticed that it infusion chemotherapy due to insurance issues presented to the ED with dyspnea, pleuritic chest pain, hemoptysis, chest x-ray showed new left retrocardiac opacity.  Interval decrease in multiple lobular opacity throughout bilateral lungs suggestive of interval improvement of metastasis.  CT chest with no PE, interval decrease in multiple bilateral lung nodules as well as hilar mediastinal lymphadenopathy.  There are new groundglass changes surrounding multiple lung nodule which may represent posttreatment changes.  New small to moderate left pleural effusion with associated compressive atelectasis changes.  There are ill-defined hypoattenuation masses in the liver and spleen highly concerning for metastasis.  CCM consulted and patient had a left side thoracentesis yielding 700 cc of bloody fluid. Cytology return negative for malignancy. Patient has been treated for PNA, pleural effusion. She was diuresed. Oxygen sat has remain stable on 2 L oxygen. Transfer to telemetry 12/21.   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 Bilateral pulmonary nodules again noted, most dominant in the right upper lung. -Repeated  chest x ray 12/20: showed small left side pleural effusion,  Multiple pulmonary masses.  -Received IV lasix 12/20. Her dyspnea is multifactorial, component of squamous cell carcinoma upper lobe.  Stable to be transfer to telemetry   Lung cancer stage IV metastasis to bone and liver: Status post palliative radiation last dose 09/2023 She was  supposed to start chemotherapy with carboplatin and Alimta, now on hold On oral chemotherapy Tagrisso 80 mg daily. Dr.  Arbutus Ped following.     Hyperlipidemia: Not on meds. out patient follow up.   Hypertension: She was  not on BP medication.  BP fluctuates. Monitor.   Low Cortisol Was check because BP was soft at some point. BP improved. She was on decadron out patient could explain low dose cortisol.  She has been on decadron, unbale to proceed with ACTH test.   Low TSH; Will proceed with free T 3 2. and Free T4. 1.1  She will need repeat TSH in 6 weeks.   Pulmonary embolism:  Eliquis resume 12/19. Doppler negative for DVT.  Thrombocytopenia;  70 --61 Monitor.   Anemia: Probably of malignancy.  Received 2 units PRBC> 12/19. Hb increase to 10  Low normal B12. Started  supplement.   Diarrhea; Started florastore.  Has had diarrhea from chemo.  Continue  imodium  GI pathogen.   Hypokalemia; Replaced.     Estimated body mass index is 25.02 kg/m as calculated from the following:   Height as of this encounter: 5\' 5"  (1.651 m).  Weight as of this encounter: 68.2 kg.   DVT prophylaxis: eliquis Code Status: full code Family Communication: Husband at bedside.   Disposition Plan:  Status is: Inpatient Remains inpatient appropriate  because: management   of Resp failure    Consultants:  CCM Dr Arbutus Ped  Procedures:  Thoracentesis. 12/18  Antimicrobials:    Subjective: She is alert, denies pain. Continue to have SOB on exertion. Report loose still every time she goes to bathroom.   Objective: Vitals:   10/21/23 0400 10/21/23 0500 10/21/23 0600 10/21/23 0821  BP: (!) 120/58     Pulse: 64 63 62 83  Resp: (!) 32 18 16 (!) 32  Temp:  97.8 F (36.6 C)    TempSrc:  Oral    SpO2: 99% 99% 98% 98%  Weight:      Height:        Intake/Output Summary (Last 24 hours) at 10/21/2023 0840 Last data filed at 10/21/2023 0123 Gross per 24 hour  Intake 380 ml  Output 1500 ml  Net -1120 ml   Filed Weights   10/17/23 0909 10/18/23 0000  Weight: 68.5 kg 68.2 kg    Examination:  General exam: NAD Respiratory system: BL ronchus Cardiovascular system: BS present, soft, nt Gastrointestinal system:BS present, soft, nt Extremities: trace edema   Data Reviewed: I have personally reviewed following labs and imaging studies  CBC: Recent Labs  Lab 10/17/23 0931 10/17/23 0943 10/18/23 0345 10/18/23 0644 10/18/23 1622 10/19/23 0712 10/19/23 2120 10/20/23 0515 10/21/23 0528  WBC 12.2*  --  8.7  --  8.6 7.4  --  8.0 6.6  NEUTROABS 11.3*  --   --   --   --   --   --   --   --   HGB 9.5*   < > 7.8*   < > 8.0* 7.1* 10.7* 10.1* 9.7*  HCT 31.1*   < > 25.5*   < > 25.8* 23.5* 32.9* 30.3* 30.1*  MCV 92.0  --  92.7  --  92.1 93.3  --  90.7 92.6  PLT 94*  --  71*  --  76* 74*  --  61* 66*   < > = values in this interval not displayed.   Basic Metabolic Panel: Recent Labs  Lab 10/17/23 0931 10/17/23 0943 10/18/23 0345 10/19/23 0712 10/20/23 0742 10/21/23 0528  NA 138 138 135 135 132* 139  K 3.2* 3.2* 3.6 3.3* 3.4* 3.8  CL 102 102 106 101 98 103  CO2 27  --  24 27 27 29   GLUCOSE 98 94 106* 120* 106* 119*  BUN 17 15 17 18 23  29*  CREATININE 0.56 0.60 0.50 0.48 0.45 0.54  CALCIUM 8.7*  --  8.1* 8.7* 8.7*  8.8*  MG 2.4  --   --   --   --   --    GFR: Estimated Creatinine Clearance: 65.6 mL/min (by C-G formula based on SCr of 0.54 mg/dL). Liver Function Tests: Recent Labs  Lab 10/17/23 0931 10/18/23 0345 10/18/23 1243 10/19/23 0712  AST 49* 36  --  26  ALT 106* 85*  --  61*  ALKPHOS 117 100  --  97  BILITOT 1.4* 1.3*  --  1.1  PROT 5.8* 5.0* 5.1* 5.0*  ALBUMIN 3.0* 2.6*  --  2.6*   No results for input(s): "LIPASE", "AMYLASE" in the last 168 hours. No results for input(s): "AMMONIA" in the last 168 hours. Coagulation Profile: No results for input(s): "INR", "PROTIME" in  the last 168 hours. Cardiac Enzymes: No results for input(s): "CKTOTAL", "CKMB", "CKMBINDEX", "TROPONINI" in the last 168 hours. BNP (last 3 results) No results for input(s): "PROBNP" in the last 8760 hours. HbA1C: No results for input(s): "HGBA1C" in the last 72 hours. CBG: No results for input(s): "GLUCAP" in the last 168 hours. Lipid Profile: Recent Labs    10/18/23 1243  CHOL 139   Thyroid Function Tests: Recent Labs    10/19/23 0712 10/20/23 0515  TSH <0.010*  --   FREET4  --  1.11  T3FREE  --  2.0   Anemia Panel: Recent Labs    10/18/23 1243 10/18/23 1412  VITAMINB12  --  281  FOLATE  --  >40.0  FERRITIN  --  650*  TIBC  --  228*  IRON  --  58  RETICCTPCT 5.3*  --    Sepsis Labs: Recent Labs  Lab 10/18/23 0345  PROCALCITON <0.10    Recent Results (from the past 240 hours)  Resp panel by RT-PCR (RSV, Flu A&B, Covid) Anterior Nasal Swab     Status: None   Collection Time: 10/17/23  9:31 AM   Specimen: Anterior Nasal Swab  Result Value Ref Range Status   SARS Coronavirus 2 by RT PCR NEGATIVE NEGATIVE Final    Comment: (NOTE) SARS-CoV-2 target nucleic acids are NOT DETECTED.  The SARS-CoV-2 RNA is generally detectable in upper respiratory specimens during the acute phase of infection. The lowest concentration of SARS-CoV-2 viral copies this assay can detect is 138 copies/mL.  A negative result does not preclude SARS-Cov-2 infection and should not be used as the sole basis for treatment or other patient management decisions. A negative result may occur with  improper specimen collection/handling, submission of specimen other than nasopharyngeal swab, presence of viral mutation(s) within the areas targeted by this assay, and inadequate number of viral copies(<138 copies/mL). A negative result must be combined with clinical observations, patient history, and epidemiological information. The expected result is Negative.  Fact Sheet for Patients:  BloggerCourse.com  Fact Sheet for Healthcare Providers:  SeriousBroker.it  This test is no t yet approved or cleared by the [place] FDA and  has been authorized for detection and/or diagnosis of SARS-CoV-2 by FDA under an Emergency Use Authorization (EUA). This EUA will remain  in effect (meaning this test can be used) for the duration of the COVID-19 declaration under Section 564(b)(1) of the Act, 21 U.S.C.section 360bbb-3(b)(1), unless the authorization is terminated  or revoked sooner.       Influenza A by PCR NEGATIVE NEGATIVE Final   Influenza B by PCR NEGATIVE NEGATIVE Final    Comment: (NOTE) The Xpert Xpress SARS-CoV-2/FLU/RSV plus assay is intended as an aid in the diagnosis of influenza from Nasopharyngeal swab specimens and should not be used as a sole basis for treatment. Nasal washings and aspirates are unacceptable for Xpert Xpress SARS-CoV-2/FLU/RSV testing.  Fact Sheet for Patients: BloggerCourse.com  Fact Sheet for Healthcare Providers: SeriousBroker.it  This test is not yet approved or cleared by the [place] FDA and has been authorized for detection and/or diagnosis of SARS-CoV-2 by FDA under an Emergency Use Authorization (EUA). This EUA will remain in effect (meaning this test  can be used) for the duration of the COVID-19 declaration under Section 564(b)(1) of the Act, 21 U.S.C. section 360bbb-3(b)(1), unless the authorization is terminated or revoked.     Resp Syncytial Virus by PCR NEGATIVE NEGATIVE Final    Comment: (NOTE) Fact Sheet for  Patients: BloggerCourse.com  Fact Sheet for Healthcare Providers: SeriousBroker.it  This test is not yet approved or cleared by the [place] FDA and has been authorized for detection and/or diagnosis of SARS-CoV-2 by FDA under an Emergency Use Authorization (EUA). This EUA will remain in effect (meaning this test can be used) for the duration of the COVID-19 declaration under Section 564(b)(1) of the Act, 21 U.S.C. section 360bbb-3(b)(1), unless the authorization is terminated or revoked.  Performed at [facility], [address], [place], [place] [address]   Respiratory (~20 pathogens) panel by PCR     Status: None   Collection Time: 10/17/23  9:31 AM   Specimen: Nasopharyngeal Swab; Respiratory  Result Value Ref Range Status   Adenovirus NOT DETECTED NOT DETECTED Final   Coronavirus 229E NOT DETECTED NOT DETECTED Final    Comment: (NOTE) The Coronavirus on the Respiratory Panel, DOES NOT test for the novel  Coronavirus (2019 nCoV)    Coronavirus HKU1 NOT DETECTED NOT DETECTED Final   Coronavirus NL63 NOT DETECTED NOT DETECTED Final   Coronavirus OC43 NOT DETECTED NOT DETECTED Final   Metapneumovirus NOT DETECTED NOT DETECTED Final   Rhinovirus / Enterovirus NOT DETECTED NOT DETECTED Final   Influenza A NOT DETECTED NOT DETECTED Final   Influenza B NOT DETECTED NOT DETECTED Final   Parainfluenza Virus 1 NOT DETECTED NOT DETECTED Final   Parainfluenza Virus 2 NOT DETECTED NOT DETECTED Final   Parainfluenza Virus 3 NOT DETECTED NOT DETECTED Final   Parainfluenza Virus 4 NOT DETECTED NOT DETECTED Final   Respiratory Syncytial Virus NOT  DETECTED NOT DETECTED Final   Bordetella pertussis NOT DETECTED NOT DETECTED Final   Bordetella Parapertussis NOT DETECTED NOT DETECTED Final   Chlamydophila pneumoniae NOT DETECTED NOT DETECTED Final   Mycoplasma pneumoniae NOT DETECTED NOT DETECTED Final    Comment: Performed at [facility], [address], [place], [place] [address]  MRSA Next Gen by PCR, Nasal     Status: None   Collection Time: 10/17/23 11:56 PM   Specimen: Nasal Mucosa; Nasal Swab  Result Value Ref Range Status   MRSA by PCR Next Gen NOT DETECTED NOT DETECTED Final    Comment: (NOTE) The GeneXpert MRSA Assay (FDA approved for NASAL specimens only), is one component of a comprehensive MRSA colonization surveillance program. It is not intended to diagnose MRSA infection nor to guide or monitor treatment for MRSA infections. Test performance is not FDA approved in patients less than 29 years old. Performed at [facility], [address], [place], [place] [address]   Body fluid culture w Gram Stain     Status: None (Preliminary result)   Collection Time: 10/18/23 11:56 AM   Specimen: Pleura; Body Fluid  Result Value Ref Range Status   Specimen Description   Final    PLEURAL Performed at [facility], [address] [place], [place] [address]    Special Requests   Final    NONE Performed at [facility], [address], [place], [place] [address]    Gram Stain   Final    RARE WBC PRESENT,BOTH PMN AND MONONUCLEAR NO ORGANISMS SEEN    Culture   Final    NO GROWTH 2 DAYS Performed at [facility], [address] [place], [place] [address]    Report Status PENDING  Incomplete  Acid Fast Smear (AFB)     Status: None   Collection Time: 10/18/23 11:59  AM   Specimen: Pleural, Left; Pleural Fluid  Result Value Ref Range Status   AFB Specimen Processing Concentration  Final   Acid Fast Smear Negative  Final    Comment: (NOTE) Performed At:  [facility] [address] [place], [place] [id number] Jolene Schimke MD VO:[phone]    Source (AFB) PLEURAL  Final    Comment: Performed at [facility], [address], [place], [place] [address]         Radiology Studies: DG Chest 2 View Result Date: 10/20/2023 CLINICAL DATA:  Dyspnea.  Known pulmonary masses. EXAM: CHEST - 2 VIEW COMPARISON:  X-ray 10/19/2023. older exams as well FINDINGS: Tiny left effusion. Underinflation. No pneumothorax or edema. Normal cardiopericardial silhouette. Masslike opacity seen in both lungs. Please correlate for known history. Left subclavian chest port with the tip overlying the SVC right atrial junction. Overlapping cardiac leads. Film is under penetrated. IMPRESSION: Underinflation.  Multiple pulmonary masses.  Tiny left effusion. Electronically Signed   By: Karen Kays M.D.   On: 10/20/2023 12:05        Scheduled Meds:  apixaban  5 mg Oral BID   Chlorhexidine Gluconate Cloth  6 each Topical Daily   vitamin B-12  500 mcg Oral Daily   dexamethasone  4 mg Oral Q12H   Followed by   Melene Muller ON 10/23/2023] dexamethasone  4 mg Oral Daily   Followed by   Melene Muller ON 10/30/2023] dexamethasone  2 mg Oral Daily   doxycycline  100 mg Oral Q12H   feeding supplement  237 mL Oral BID BM   folic acid  1 mg Oral Daily   osimertinib mesylate  80 mg Oral q1600   pantoprazole  40 mg Oral BID   potassium chloride  40 mEq Oral Once   saccharomyces boulardii  250 mg Oral BID   sodium chloride flush  10-40 mL Intracatheter Q12H   Continuous Infusions:  cefTRIAXone (ROCEPHIN)  IV Stopped (10/20/23 1547)     LOS: 4 days    Time spent: 35 minutes    Sorin Frimpong A Raffaela Ladley, MD [facility]   If 7PM-7AM, please contact night-coverage [email or web address]  10/21/2023, 8:40 AM

## 2023-10-21 NOTE — Plan of Care (Signed)

## 2023-10-21 NOTE — Evaluation (Signed)
Physical Therapy Evaluation Patient Details Name: Rebecca Ochoa MRN: [id number] DOB: May 03, 1961 Today's Date: 10/21/2023  History of Present Illness  Patient is a 62 year old female who presented to the hospital on 12/17 with dyspnea and pleuritis chest pain. CT was negative for PE but noted to have new small to moderate left pleural effusion with associated compressive atelectasis changes. Patient was admitted with acute hypoxic respiratory failure, asthma, PNA,.patient underwent thoracentesis yielding 700ccs.  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  Clinical Impression  Patient is a 62 year old female who was admitted for above. Patient was living at home with some support from significant other. Patients HR up to 133 bpm during activity with standing rest breaks. Patient was noted to have decreased functional activity tolerance, decreased endurance, and decreased standing balance. Patient plans to transition home with HH at time of d/c.  Patient would continue to benefit from skilled PT services at this time while admitted and after d/c to address noted deficits in order to improve overall safety and independence.      If plan is discharge home, recommend the following: A little help with walking and/or transfers;A little help with bathing/dressing/bathroom;Assistance with cooking/housework;Assist for transportation;Help with stairs or ramp for entrance   Can travel by private vehicle        Equipment Recommendations None recommended by PT  Recommendations for Other Services       Functional Status Assessment Patient has had a recent decline in their functional status and demonstrates the ability to make significant improvements in function in a reasonable and predictable amount of time.     Precautions / Restrictions Precautions Precautions: Fall Precaution Comments: monitor O2, Restrictions Weight Bearing Restrictions Per Provider Order: No       Mobility  Bed Mobility Overal bed mobility: Needs Assistance Bed Mobility: Supine to Sit     Supine to sit: Contact guard, HOB elevated          Transfers Overall transfer level: Needs assistance   Transfers: Sit to/from Stand Sit to Stand: Contact guard assist           General transfer comment: Steady assist with cues for use of UEs to self assist    Ambulation/Gait Ambulation/Gait assistance: Min assist, +2 safety/equipment Gait Distance (Feet): 75 Feet Assistive device: Rolling walker (2 wheels) Gait Pattern/deviations: Decreased step length - right, Decreased step length - left, Step-to pattern, Step-through pattern, Shuffle, Trunk flexed Gait velocity: decr     General Gait Details: cues for posture and position from [employer]            Wheelchair Mobility     Tilt Bed    Modified Rankin (Stroke Patients Only)       Balance Overall balance assessment: Mild deficits observed, not formally tested                                           Pertinent Vitals/Pain Pain Assessment Pain Assessment: No/denies pain    Home Living Family/patient expects to be discharged to:: Private residence Living Arrangements: Spouse/significant other Available Help at Discharge: Family;Available 24 hours/day Type of Home: House Home Access: Stairs to enter Entrance Stairs-Rails: None Entrance Stairs-Number of Steps: 3   Home Layout: One level Home Equipment: Rollator (4 wheels);Shower seat Additional Comments: supplemental O2, sleep in recliner,    Prior  Function Prior Level of Function : Needs assist             Mobility Comments: mobilizing in home without assistive device but requiring assist to manage stairs ADLs Comments: patient has help from significant other for ADLs as needed.     Extremity/Trunk Assessment   Upper Extremity Assessment Upper Extremity Assessment: Overall WFL for tasks assessed    Lower Extremity  Assessment Lower Extremity Assessment: Generalized weakness ("my legs feel so heavy")    Cervical / Trunk Assessment Cervical / Trunk Assessment: Normal  Communication   Communication Communication: No apparent difficulties  Cognition Arousal: Alert Behavior During Therapy: WFL for tasks assessed/performed Overall Cognitive Status: Within Functional Limits for tasks assessed                                 General Comments: plesant and cooperative        General Comments      Exercises     Assessment/Plan    PT Assessment Patient needs continued PT services  PT Problem List Decreased strength;Decreased range of motion;Decreased activity tolerance;Decreased balance;Decreased mobility;Decreased knowledge of use of DME       PT Treatment Interventions DME instruction;Gait training;Stair training;Functional mobility training;Therapeutic activities;Therapeutic exercise;Patient/family education    PT Goals (Current goals can be found in the Care Plan section)  Acute Rehab PT Goals Patient Stated Goal: Regain strength and IND for return home PT Goal Formulation: With patient Time For Goal Achievement: 11/02/23 Potential to Achieve Goals: Good    Frequency Min 1X/week     Co-evaluation PT/OT/SLP Co-Evaluation/Treatment: Yes Reason for Co-Treatment: To address functional/ADL transfers PT goals addressed during session: Mobility/safety with mobility OT goals addressed during session: ADL's and self-care       AM-PAC PT "6 Clicks" Mobility  Outcome Measure Help needed turning from your back to your side while in a flat bed without using bedrails?: A Little Help needed moving from lying on your back to sitting on the side of a flat bed without using bedrails?: A Little Help needed moving to and from a bed to a chair (including a wheelchair)?: A Little Help needed standing up from a chair using your arms (e.g., wheelchair or bedside chair)?: A Little Help  needed to walk in hospital room?: A Little Help needed climbing 3-5 steps with a railing? : A Lot 6 Click Score: 17    End of Session Equipment Utilized During Treatment: Gait belt Activity Tolerance: Patient tolerated treatment well Patient left: in chair;with call bell/phone within reach Nurse Communication: Mobility status PT Visit Diagnosis: Difficulty in walking, not elsewhere classified (R26.2)    Time: 1610-9604 PT Time Calculation (min) (ACUTE ONLY): 25 min   Charges:   PT Evaluation $PT Eval Low Complexity: 1 Low   PT General Charges $$ ACUTE PT VISIT: 1 Visit         Mauro Kaufmann PT Acute Rehabilitation Services Pager [phone] Office [phone]   Euline Kimbler 10/21/2023, 5:11 PM

## 2023-10-22 DIAGNOSIS — J45909 Unspecified asthma, uncomplicated: Secondary | ICD-10-CM | POA: Diagnosis not present

## 2023-10-22 DIAGNOSIS — E785 Hyperlipidemia, unspecified: Secondary | ICD-10-CM

## 2023-10-22 DIAGNOSIS — J9601 Acute respiratory failure with hypoxia: Secondary | ICD-10-CM | POA: Diagnosis not present

## 2023-10-22 LAB — GASTROINTESTINAL PANEL BY PCR, STOOL (REPLACES STOOL CULTURE)

## 2023-10-22 LAB — BODY FLUID CULTURE W GRAM STAIN: Culture: NO GROWTH

## 2023-10-22 NOTE — Progress Notes (Addendum)
PROGRESS NOTE    Rebecca Ochoa  ZOX:[id number] DOB: [birth year] DOA: 10/17/2023 PCP: [name], MD   Brief Narrative:  62 year old with past medical history significant for anemia, mild intermittent asthma, GERD, migraine headaches, hyperlipidemia, hypertension, vitamin D deficiency, history of PE on Xarelto, lung cancer undergoing palliative radiation and oral chemotherapy, but has noticed that it infusion chemotherapy due to insurance issues presented to the ED with dyspnea, pleuritic chest pain, hemoptysis, chest x-ray showed new left retrocardiac opacity.  Interval decrease in multiple lobular opacity throughout bilateral lungs suggestive of interval improvement of metastasis.  CT chest with no PE, interval decrease in multiple bilateral lung nodules as well as hilar mediastinal lymphadenopathy.  There are new groundglass changes surrounding multiple lung nodule which may represent posttreatment changes.  New small to moderate left pleural effusion with associated compressive atelectasis changes.  There are ill-defined hypoattenuation masses in the liver and spleen highly concerning for metastasis. PCCM consulted and patient had a left side thoracentesis yielding 700 cc of bloody fluid. Cytology return negative for malignancy. Patient has been treated for PNA, pleural effusion. She was diuresed. Oxygen sat has remain stable on 2 L oxygen. Transfer to telemetry 12/21.   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  Continue IV antibiotic with ceftriaxone and Doxy  Chest x ray on 12/19: left base atelectasis or infiltrate with effusion. Bilateral pulmonary nodules again noted, most dominant in the right upper lung.  Patient received Lasix 10/20/2023.  Dyspnea was thought to be multifactorial from squamous cell carcinoma of the lung, pneumonia and pleural effusion.  Currently on room air and feels better with breathing.  Improving dyspnea.  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 Dr.  Arbutus Ped follows up with outpatient.  Hyperlipidemia: Not on medication.  Hypertension: Not on medication.  Low TSH; normal free T3 and free T4. She will need repeat TSH in 6 weeks.   History of pulmonary embolism:  Continue Eliquis.  CT angio done 10/17/2023 with no pulmonary embolism.  Doppler negative for DVT.  Thrombocytopenia;  Mild.  Latest platelet count of 66.  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  Subjective units of packed RBC during hospitalization.  Latest hemoglobin at 9.7.    Low normal B12. Started vitamin B12 supplement.   Diarrhea continue Florastor, Imodium, check GI pathogen panel.  Hypokalemia; Replaced.  Latest potassium of 3.8.  Check BMP in AM.  Debility, weakness.  States that she is unable to stand up from the commode and is by herself at home.  Wishes to have more physical therapy while in the hospital.  States that she does not have much help at home  DVT prophylaxis: eliquis  Code Status: full code  Family Communication: None at bedside  Disposition Plan: Home likely 10/23/2023  Status is: Inpatient  Remains inpatient appropriate because:  pending clinical improvement, debility weakness   Consultants:   PCCM Oncology Dr Arbutus Ped  Procedures:  Thoracentesis on 12/18  Antimicrobials:  Rocephin IV Doxycycline  Subjective: Today, patient was seen and examined at bedside.  Still complains of weakness while sitting up in the bathroom, shortness of breath better.  Still has loose stools.  Denies any nausea vomiting and was able to tolerate oral.  Complains of fatigue and weakness.  Objective: Vitals:   10/22/23 0604 10/22/23 0840 10/22/23 0908 10/22/23 0924  BP: 116/68     Pulse: 81     Resp: 14     Temp: 98.2 F (36.8 C)     TempSrc: Oral     SpO2: 96% 100% 99% 96%  Weight:      Height:        Intake/Output Summary (Last 24 hours) at 10/22/2023 1250 Last data filed at 10/21/2023 1720 Gross per 24 hour  Intake 100 ml  Output --  Net 100 ml   Filed Weights   10/17/23 0909 10/18/23 0000 10/21/23 1857  Weight: 68.5 kg 68.2 kg 66 kg    Physical examination: Body mass index is 24.21 kg/m.   General:  Average built, not in obvious distress, Communicative HENT:   Mild pallor noted oral mucosa is moist.  Chest:  Diminished breath sounds bilaterally. No crackles or wheezes.  Left chest wall port CVS: S1 &S2 heard. No murmur.  Regular rate and rhythm. Abdomen: Soft, nontender, nondistended.  Bowel sounds are heard.   Extremities: No cyanosis, clubbing or edema.  Peripheral pulses are palpable. Psych: Alert, awake and oriented, normal mood CNS:  No cranial nerve deficits.  Power equal in all extremities.  Generalized weakness noted Skin: Warm and dry.  No rashes noted.  Data Reviewed: I have personally reviewed following labs and imaging studies  CBC: Recent Labs  Lab 10/17/23 0931 10/17/23 0943 10/18/23 0345 10/18/23 0644 10/18/23 1622 10/19/23 0712 10/19/23 2120 10/20/23 0515 10/21/23 0528  WBC 12.2*  --  8.7  --  8.6 7.4  --  8.0 6.6  NEUTROABS 11.3*  --   --   --   --   --   --   --   --   HGB 9.5*   < > 7.8*   < > 8.0* 7.1* 10.7* 10.1* 9.7*  HCT 31.1*   < >  25.5*   < > 25.8* 23.5* 32.9* 30.3* 30.1*  MCV 92.0  --  92.7  --  92.1 93.3  --  90.7 92.6  PLT 94*  --  71*  --  76* 74*  --  61* 66*   < > = values in this interval not displayed.   Basic Metabolic Panel: Recent Labs  Lab 10/17/23 0931 10/17/23 0943 10/18/23 0345 10/19/23 0712 10/20/23 0742 10/21/23 0528  NA 138 138 135 135 132* 139  K 3.2* 3.2* 3.6 3.3* 3.4* 3.8  CL 102 102 106 101 98 103  CO2 27  --  24 27 27 29   GLUCOSE 98 94 106* 120* 106* 119*  BUN 17 15 17 18 23  29*  CREATININE 0.56 0.60 0.50 0.48 0.45 0.54  CALCIUM 8.7*  --  8.1* 8.7* 8.7* 8.8*  MG 2.4  --   --   --   --   --    GFR: Estimated Creatinine Clearance: 65.6 mL/min (by C-G formula based on SCr of 0.54 mg/dL). Liver Function Tests: Recent Labs  Lab 10/17/23 0931 10/18/23  0345 10/18/23 1243 10/19/23 0712  AST 49* 36  --  26  ALT 106* 85*  --  61*  ALKPHOS 117 100  --  97  BILITOT 1.4* 1.3*  --  1.1  PROT 5.8* 5.0* 5.1* 5.0*  ALBUMIN 3.0* 2.6*  --  2.6*   No results for input(s): "LIPASE", "AMYLASE" in the last 168 hours. No results for input(s): "AMMONIA" in the last 168 hours. Coagulation Profile: No results for input(s): "INR", "PROTIME" in the last 168 hours. Cardiac Enzymes: No results for input(s): "CKTOTAL", "CKMB", "CKMBINDEX", "TROPONINI" in the last 168 hours. BNP (last 3 results) No results for input(s): "PROBNP" in the last 8760 hours. HbA1C: No results for input(s): "HGBA1C" in the last 72 hours. CBG: No results for input(s): "GLUCAP" in the last 168 hours. Lipid Profile: No results for input(s): "CHOL", "HDL", "LDLCALC", "TRIG", "CHOLHDL", "LDLDIRECT" in the last 72 hours.  Thyroid Function Tests: Recent Labs    10/20/23 0515  FREET4 1.11  T3FREE 2.0   Anemia Panel: No results for input(s): "VITAMINB12", "FOLATE", "FERRITIN", "TIBC", "IRON", "RETICCTPCT" in the last 72 hours.  Sepsis Labs: Recent Labs  Lab 10/18/23 0345  PROCALCITON <0.10    Recent Results (from  the past 240 hours)  Resp panel by RT-PCR (RSV, Flu A&B, Covid) Anterior Nasal Swab     Status: None   Collection Time: 10/17/23  9:31 AM   Specimen: Anterior Nasal Swab  Result Value Ref Range Status   SARS Coronavirus 2 by RT PCR NEGATIVE NEGATIVE Final    Comment: (NOTE) SARS-CoV-2 target nucleic acids are NOT DETECTED.  The SARS-CoV-2 RNA is generally detectable in upper respiratory specimens during the acute phase of infection. The lowest concentration of SARS-CoV-2 viral copies this assay can detect is 138 copies/mL. A negative result does not preclude SARS-Cov-2 infection and should not be used as the sole basis for treatment or other patient management decisions. A negative result may occur with  improper specimen collection/handling, submission of specimen other than nasopharyngeal swab, presence of viral mutation(s) within the areas targeted by this assay, and inadequate number of viral copies(<138 copies/mL). A negative result must be combined with clinical observations, patient history, and epidemiological information. The expected result is Negative.  Fact Sheet for Patients:  BloggerCourse.com  Fact Sheet for Healthcare Providers:  SeriousBroker.it  This test is no t yet approved or cleared by the [place] FDA and  has been authorized for detection and/or diagnosis of SARS-CoV-2 by FDA under an Emergency Use Authorization (EUA). This EUA will remain  in effect (meaning this test can be used) for the duration of the COVID-19 declaration under Section 564(b)(1) of the Act, 21 U.S.C.section 360bbb-3(b)(1), unless the authorization is terminated  or revoked sooner.       Influenza A by PCR NEGATIVE NEGATIVE Final   Influenza B by PCR NEGATIVE NEGATIVE Final    Comment: (NOTE) The Xpert Xpress SARS-CoV-2/FLU/RSV plus assay is intended as an aid in the diagnosis of influenza from Nasopharyngeal swab specimens  and should not be used as a sole basis for treatment. Nasal washings and aspirates are unacceptable for Xpert Xpress SARS-CoV-2/FLU/RSV testing.  Fact Sheet for Patients: BloggerCourse.com  Fact Sheet for Healthcare Providers: SeriousBroker.it  This test is not yet approved or cleared by the [place] FDA and has been authorized for detection and/or diagnosis of SARS-CoV-2 by FDA under an Emergency Use Authorization (EUA). This EUA will remain in effect (meaning this test can be used) for the  duration of the COVID-19 declaration under Section 564(b)(1) of the Act, 21 U.S.C. section 360bbb-3(b)(1), unless the authorization is terminated or revoked.     Resp Syncytial Virus by PCR NEGATIVE NEGATIVE Final    Comment: (NOTE) Fact Sheet for Patients: BloggerCourse.com  Fact Sheet for Healthcare Providers: SeriousBroker.it  This test is not yet approved or cleared by the [place] FDA and has been authorized for detection and/or diagnosis of SARS-CoV-2 by FDA under an Emergency Use Authorization (EUA). This EUA will remain in effect (meaning this test can be used) for the duration of the COVID-19 declaration under Section 564(b)(1) of the Act, 21 U.S.C. section 360bbb-3(b)(1), unless the authorization is terminated or revoked.  Performed at [facility], [address], [place], [place] [address]   Respiratory (~20 pathogens) panel by PCR     Status: None   Collection Time: 10/17/23  9:31 AM   Specimen: Nasopharyngeal Swab; Respiratory  Result Value Ref Range Status   Adenovirus NOT DETECTED NOT DETECTED Final   Coronavirus 229E NOT DETECTED NOT DETECTED Final    Comment: (NOTE) The Coronavirus on the Respiratory Panel, DOES NOT test for the novel  Coronavirus (2019 nCoV)    Coronavirus HKU1 NOT DETECTED NOT DETECTED Final   Coronavirus NL63 NOT  DETECTED NOT DETECTED Final   Coronavirus OC43 NOT DETECTED NOT DETECTED Final   Metapneumovirus NOT DETECTED NOT DETECTED Final   Rhinovirus / Enterovirus NOT DETECTED NOT DETECTED Final   Influenza A NOT DETECTED NOT DETECTED Final   Influenza B NOT DETECTED NOT DETECTED Final   Parainfluenza Virus 1 NOT DETECTED NOT DETECTED Final   Parainfluenza Virus 2 NOT DETECTED NOT DETECTED Final   Parainfluenza Virus 3 NOT DETECTED NOT DETECTED Final   Parainfluenza Virus 4 NOT DETECTED NOT DETECTED Final   Respiratory Syncytial Virus NOT DETECTED NOT DETECTED Final   Bordetella pertussis NOT DETECTED NOT DETECTED Final   Bordetella Parapertussis NOT DETECTED NOT DETECTED Final   Chlamydophila pneumoniae NOT DETECTED NOT DETECTED Final   Mycoplasma pneumoniae NOT DETECTED NOT DETECTED Final    Comment: Performed at [facility], [address], [place], [place] [address]  MRSA Next Gen by PCR, Nasal     Status: None   Collection Time: 10/17/23 11:56 PM   Specimen: Nasal Mucosa; Nasal Swab  Result Value Ref Range Status   MRSA by PCR Next Gen NOT DETECTED NOT DETECTED Final    Comment: (NOTE) The GeneXpert MRSA Assay (FDA approved for NASAL specimens only), is one component of a comprehensive MRSA colonization surveillance program. It is not intended to diagnose MRSA infection nor to guide or monitor treatment for MRSA infections. Test performance is not FDA approved in patients less than 31 years old. Performed at [facility], [address], [place], [place] [address]   Body fluid culture w Gram Stain     Status: None   Collection Time: 10/18/23 11:56 AM   Specimen: Pleura; Body Fluid  Result Value Ref Range Status   Specimen Description   Final    PLEURAL Performed at [facility], [address] [place], [place] [address]    Special Requests   Final    NONE Performed at [facility], [address], [place],  [place] [address]    Gram Stain   Final    RARE WBC PRESENT,BOTH PMN AND MONONUCLEAR NO ORGANISMS SEEN    Culture   Final    NO  GROWTH 3 DAYS Performed at [facility], [address] [place], [place] [address]    Report Status 10/22/2023 FINAL  Final  Acid Fast Smear (AFB)     Status: None   Collection Time: 10/18/23 11:59 AM   Specimen: Pleural, Left; Pleural Fluid  Result Value Ref Range Status   AFB Specimen Processing Concentration  Final   Acid Fast Smear Negative  Final    Comment: (NOTE) Performed At: [facility] [address] [place], [place] [id number] Jolene Schimke MD OZ:[id number]    Source (AFB) PLEURAL  Final    Comment: Performed at [facility], [address] [place], [place] [address]       Radiology Studies: No results found.  Scheduled Meds:  apixaban  5 mg Oral BID   Chlorhexidine Gluconate Cloth  6 each Topical Daily   vitamin B-12  500 mcg Oral Daily   dexamethasone  4 mg Oral Q12H   Followed by   Melene Muller ON 10/23/2023] dexamethasone  4 mg Oral Daily   Followed by   Melene Muller ON 10/30/2023] dexamethasone  2 mg Oral Daily   doxycycline  100 mg Oral Q12H   feeding supplement  237 mL Oral BID BM   folic acid  1 mg Oral Daily   osimertinib mesylate  80 mg Oral q1600   pantoprazole  40 mg Oral BID   potassium chloride  40 mEq Oral Once   saccharomyces boulardii  250 mg Oral BID   sodium chloride flush  10-40 mL Intracatheter Q12H   Continuous Infusions:  cefTRIAXone (ROCEPHIN)  IV Stopped (10/21/23 1656)     LOS: 5 days    Joycelyn Das, MD [facility] If 7PM-7AM, please contact night-coverage [email or web address] 10/22/2023, 12:50 PM

## 2023-10-22 NOTE — Plan of Care (Signed)

## 2023-10-22 NOTE — Progress Notes (Signed)
Physical Therapy Treatment Patient Details Name: Rebecca Ochoa MRN: [id number] DOB: 11-12-1960 Today's Date: 10/22/2023   History of Present Illness Patient is a 62 year old female who presented to the hospital on 12/17 with dyspnea and pleuritis chest pain. CT was negative for PE but noted to have new small to moderate left pleural effusion with associated compressive atelectasis changes. Patient was admitted with acute hypoxic respiratory failure, asthma, PNA,.patient underwent thoracentesis yielding 700ccs.  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    PT Comments  Pt continues very cooperative and progressing steadily with mobility.  This date, pt up to bathroom for toileting and to ambulate limited distance in hall with rollator - pt has rollator at home - but with noted decreased stability.  Pt considering RW for use at home.  Significant other in room and reports he has ordered 3n1 through [employer]. Pt maintained O2 sats at 94% or higher on RA with activity; max HR observed 137.   If plan is discharge home, recommend the following: A little help with walking and/or transfers;A little help with bathing/dressing/bathroom;Assistance with cooking/housework;Assist for transportation;Help with stairs or ramp for entrance   Can travel by private vehicle        Equipment Recommendations  None recommended by PT (Pt is considering RW)    Recommendations for Other Services       Precautions / Restrictions Precautions Precautions: Fall Precaution Comments: monitor O2, Restrictions Weight Bearing Restrictions Per Provider Order: No     Mobility  Bed Mobility Overal bed mobility: Needs Assistance Bed Mobility: Supine to Sit     Supine to sit: Supervision     General bed mobility comments: Increased time but no physical assist    Transfers Overall transfer level: Needs assistance Equipment used: Rolling walker (2 wheels) Transfers: Sit to/from Stand Sit to  Stand: Contact guard assist, From elevated surface           General transfer comment: cues for use of UEs to self assist    Ambulation/Gait Ambulation/Gait assistance: Min assist, Contact guard assist Gait Distance (Feet): 120 Feet (and additional 15' into bathroom) Assistive device: Rolling walker (2 wheels), Rollator (4 wheels) Gait Pattern/deviations: Step-to pattern, Step-through pattern, Decreased step length - right, Decreased step length - left, Shuffle Gait velocity: decr     General Gait Details: cues for posture and position from RW; noted deterioration in stability with use of rollator   Stairs             Wheelchair Mobility     Tilt Bed    Modified Rankin (Stroke Patients Only)       Balance Overall balance assessment: Mild deficits observed, not formally tested                                          Cognition Arousal: Alert Behavior During Therapy: WFL for tasks assessed/performed Overall Cognitive Status: Within Functional Limits for tasks assessed                                 General Comments: plesant and cooperative        Exercises General Exercises - Lower Extremity Ankle Circles/Pumps: AROM, Both, 15 reps, Supine Quad Sets: AROM, Both, 5 reps, Supine Long Arc Quad: AROM, Both, 5 reps, Seated  General Comments        Pertinent Vitals/Pain Pain Assessment Pain Assessment: No/denies pain    Home Living                          Prior Function            PT Goals (current goals can now be found in the care plan section) Acute Rehab PT Goals Patient Stated Goal: Regain strength and IND for return home PT Goal Formulation: With patient Time For Goal Achievement: 11/02/23 Potential to Achieve Goals: Good Progress towards PT goals: Progressing toward goals    Frequency    Min 1X/week      PT Plan      Co-evaluation              AM-PAC PT "6 Clicks" Mobility    Outcome Measure  Help needed turning from your back to your side while in a flat bed without using bedrails?: A Little Help needed moving from lying on your back to sitting on the side of a flat bed without using bedrails?: A Little Help needed moving to and from a bed to a chair (including a wheelchair)?: A Little Help needed standing up from a chair using your arms (e.g., wheelchair or bedside chair)?: A Little Help needed to walk in hospital room?: A Little Help needed climbing 3-5 steps with a railing? : A Lot 6 Click Score: 17    End of Session Equipment Utilized During Treatment: Gait belt Activity Tolerance: Patient tolerated treatment well Patient left: in chair;with call bell/phone within reach;with family/visitor present Nurse Communication: Mobility status PT Visit Diagnosis: Difficulty in walking, not elsewhere classified (R26.2)     Time: 4098-1191 PT Time Calculation (min) (ACUTE ONLY): 29 min  Charges:    $Gait Training: 8-22 mins $Therapeutic Activity: 8-22 mins PT General Charges $$ ACUTE PT VISIT: 1 Visit                     Mauro Kaufmann PT Acute Rehabilitation Services Pager [phone] Office [phone]    Rebecca Ochoa 10/22/2023, 12:59 PM

## 2023-10-23 DIAGNOSIS — J9601 Acute respiratory failure with hypoxia: Secondary | ICD-10-CM | POA: Diagnosis not present

## 2023-10-23 DIAGNOSIS — J45909 Unspecified asthma, uncomplicated: Secondary | ICD-10-CM | POA: Diagnosis not present

## 2023-10-23 LAB — CBC
HCT: 34.4 % — ABNORMAL LOW (ref 36.0–46.0)
Hemoglobin: 10.9 g/dL — ABNORMAL LOW (ref 12.0–15.0)
MCH: 29.3 pg (ref 26.0–34.0)
MCHC: 31.7 g/dL (ref 30.0–36.0)
MCV: 92.5 fL (ref 80.0–100.0)
Platelets: 81 10*3/uL — ABNORMAL LOW (ref 150–400)
RBC: 3.72 MIL/uL — ABNORMAL LOW (ref 3.87–5.11)
RDW: 17.8 % — ABNORMAL HIGH (ref 11.5–15.5)
WBC: 8.7 10*3/uL (ref 4.0–10.5)
nRBC: 0 % (ref 0.0–0.2)

## 2023-10-23 LAB — BASIC METABOLIC PANEL
Anion gap: 8 (ref 5–15)
BUN: 23 mg/dL (ref 8–23)
CO2: 27 mmol/L (ref 22–32)
Calcium: 9.1 mg/dL (ref 8.9–10.3)
Chloride: 104 mmol/L (ref 98–111)
Creatinine, Ser: 0.63 mg/dL (ref 0.44–1.00)
GFR, Estimated: >60 mL/min (ref >60)
Glucose, Bld: 111 mg/dL — ABNORMAL HIGH (ref 70–99)
Potassium: 3.6 mmol/L (ref 3.5–5.1)
Sodium: 139 mmol/L (ref 135–145)

## 2023-10-23 MED ORDER — CYANOCOBALAMIN 500 MCG PO TABS: 500.0000 ug | ORAL_TABLET | Freq: Every day | ORAL | 0 refills | Status: DC

## 2023-10-23 MED ORDER — SACCHAROMYCES BOULARDII 250 MG PO CAPS: 250.0000 mg | ORAL_CAPSULE | Freq: Two times a day (BID) | ORAL | 0 refills | Status: DC

## 2023-10-23 MED ORDER — DEXAMETHASONE 2 MG PO TABS: ORAL_TABLET | ORAL | Status: AC

## 2023-10-23 MED ORDER — LOPERAMIDE HCL 2 MG PO CAPS: 2.0000 mg | ORAL_CAPSULE | ORAL | 0 refills | Status: DC | PRN

## 2023-10-23 MED ORDER — ENSURE ENLIVE PO LIQD: 237.0000 mL | Freq: Two times a day (BID) | ORAL | 12 refills | Status: DC

## 2023-10-23 MED ORDER — DOXYCYCLINE HYCLATE 100 MG PO TABS: 100.0000 mg | ORAL_TABLET | Freq: Two times a day (BID) | ORAL | 0 refills | Status: AC

## 2023-10-23 MED ORDER — CEFDINIR 300 MG PO CAPS: 300.0000 mg | ORAL_CAPSULE | Freq: Two times a day (BID) | ORAL | 0 refills | Status: AC

## 2023-10-23 MED ORDER — ALBUTEROL SULFATE (2.5 MG/3ML) 0.083% IN NEBU: 2.5000 mg | INHALATION_SOLUTION | RESPIRATORY_TRACT | 12 refills | Status: DC | PRN

## 2023-10-23 MED ORDER — HEPARIN SOD (PORK) LOCK FLUSH 100 UNIT/ML IV SOLN
500.0000 [IU] | Freq: Once | INTRAVENOUS | Status: AC
  Administered 2023-10-23: 500 [IU] via INTRAVENOUS
  Filled 2023-10-23: qty 5

## 2023-10-23 NOTE — Progress Notes (Signed)
Physical Therapy Treatment Patient Details Name: Rebecca Ochoa MRN: [id number] DOB: 06/06/1961 Today's Date: 10/23/2023   History of Present Illness Patient is a 62 year old female who presented to the hospital on 12/17 with dyspnea and pleuritis chest pain. CT was negative for PE but noted to have new small to moderate left pleural effusion with associated compressive atelectasis changes. Patient was admitted with acute hypoxic respiratory failure, asthma, PNA,.patient underwent thoracentesis yielding 700ccs.  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    PT Comments  Pt AxO x 3 very pleasant Lady and hoping to go home today.  Prior to PT session, pt was in the bathroom with the NT and shared that she had loose stools "on the way" to the bathroom "all over me and the floor".  Pt stated she "could not get there fast enough".  Also observed her HR was as high as 157 getting cleaned up.  Pt required an extended rest break before attempting amb in hallway with PT.   General bed mobility comments: self able but uses B UE's to move B LE off/on bed due to B LE weakness "lifting"  General transfer comment: cues for use of UEs to self assist and lock Rollator Brakes. General Gait Details: decreased amb distance this session due to MAX c/o fatigue with HR at rest 104 and with activity as high as 157 with RA increasing from 95% to 99% and 4/4 dyspnea.  "I'm sorry I can't do much".  "I feel so tired and weak". Assisted back to bed per Pt request to rest.  Required a 10 min rest break for HR to drop back down to 104.    Pt plans to D/C to home with Family support.  LPT has rec HH.  Pt would like a regular RW "It's lighter to transport" but does have her Mom's Rollator at home "if needed".     If plan is discharge home, recommend the following: A little help with walking and/or transfers;A little help with bathing/dressing/bathroom;Assistance with cooking/housework;Assist for  transportation;Help with stairs or ramp for entrance   Can travel by private vehicle        Equipment Recommendations  Rolling walker (2 wheels)    Recommendations for Other Services       Precautions / Restrictions Precautions Precautions: Fall Precaution Comments: monitor O2 and HR Restrictions Weight Bearing Restrictions Per Provider Order: No     Mobility  Bed Mobility Overal bed mobility: Modified Independent             General bed mobility comments: self able but uses B UE's to move B LE off/on bed due to B LE weakness "lifting"    Transfers Overall transfer level: Needs assistance Equipment used: Rollator (4 wheels) Transfers: Sit to/from Stand Sit to Stand: Contact guard assist, From elevated surface, Supervision           General transfer comment: cues for use of UEs to self assist and lock Rollator Brakes.    Ambulation/Gait Ambulation/Gait assistance: Supervision, Contact guard assist Gait Distance (Feet): 25 Feet Assistive device: Rolling walker (2 wheels) Gait Pattern/deviations: Step-to pattern, Step-through pattern, Decreased step length - right, Decreased step length - left, Shuffle Gait velocity: decr     General Gait Details: decreased amb distance this session due to MAX c/o fatigue with HR at rest 104 and with activity as high as 157 with RA increasing from 95% to 99% and 4/4 dyspnea.  "I'm sorry I  can't do much".  "I feel so tired and weak".   Stairs             Wheelchair Mobility     Tilt Bed    Modified Rankin (Stroke Patients Only)       Balance                                            Cognition Arousal: Alert Behavior During Therapy: WFL for tasks assessed/performed                                   General Comments: plesant and cooperative        Exercises      General Comments        Pertinent Vitals/Pain Pain Assessment Pain Assessment: No/denies pain     Home Living                          Prior Function            PT Goals (current goals can now be found in the care plan section) Progress towards PT goals: Progressing toward goals    Frequency    Min 1X/week      PT Plan      Co-evaluation              AM-PAC PT "6 Clicks" Mobility   Outcome Measure  Help needed turning from your back to your side while in a flat bed without using bedrails?: None Help needed moving from lying on your back to sitting on the side of a flat bed without using bedrails?: None Help needed moving to and from a bed to a chair (including a wheelchair)?: None Help needed standing up from a chair using your arms (e.g., wheelchair or bedside chair)?: A Little Help needed to walk in hospital room?: A Little Help needed climbing 3-5 steps with a railing? : A Little 6 Click Score: 21    End of Session Equipment Utilized During Treatment: Gait belt Activity Tolerance: Patient tolerated treatment well Patient left: in bed;with call bell/phone within reach;with bed alarm set Nurse Communication: Mobility status PT Visit Diagnosis: Difficulty in walking, not elsewhere classified (R26.2)     Time: 8413-2440 PT Time Calculation (min) (ACUTE ONLY): 12 min  Charges:    $Gait Training: 8-22 mins PT General Charges $$ ACUTE PT VISIT: 1 Visit                     Felecia Shelling  PTA Acute  Rehabilitation Services Office M-F          [phone]

## 2023-10-23 NOTE — Progress Notes (Signed)
   10/22/23 2300  BiPAP/CPAP/SIPAP  Reason BIPAP/CPAP not in use Other(comment);Non-compliant (BiPAP PRN-Not in RM.)

## 2023-10-23 NOTE — Progress Notes (Signed)
Patient discharge instructions discussed; patient does not have any questions at this time. All personal items accounted for. Port heparin locked and discontinued prior to discharge. Patient taken via wheelchair by NT to personal vehicle being driven by her husband.

## 2023-10-23 NOTE — Progress Notes (Signed)
Pharmacist Chemotherapy Monitoring - Initial Assessment    Anticipated start date: 10/31/23   The following has been reviewed per standard work regarding the patient's treatment regimen: The patient's diagnosis, treatment plan and drug doses, and organ/hematologic function Lab orders and baseline tests specific to treatment regimen  The treatment plan start date, drug sequencing, and pre-medications Prior authorization status  Patient's documented medication list, including drug-drug interaction screen and prescriptions for anti-emetics and supportive care specific to the treatment regimen The drug concentrations, fluid compatibility, administration routes, and timing of the medications to be used The patient's access for treatment and lifetime cumulative dose history, if applicable  The patient's medication allergies and previous infusion related reactions, if applicable   Changes made to treatment plan:  N/A  Follow up needed:  N/A   Jerry Caras, PharmD PGY2 Oncology Pharmacy Resident   10/23/2023 3:28 PM

## 2023-10-23 NOTE — Discharge Summary (Signed)
Physician Discharge Summary  Rebecca Ochoa MVH:[id number] DOB: Sep 30, 1961 DOA: 10/17/2023  PCP: [name], MD  Admit date: 10/17/2023 Discharge date: 10/23/2023  Admitted From: Home  Discharge disposition: Home with Home health  Recommendations for Outpatient Follow-Up:   Follow up with your primary care provider in one week.  Check CBC, CMP, magnesium in the next visit Follow-up with oncology as outpatient as scheduled by the clinic/patient. Recommend follow-up chest x-ray in 4 to 6 weeks.  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  Diet recommendation: Low sodium, heart healthy.   Wound care: None.  Code status: Full.   History of Present Illness:   62 year old with past medical history significant for anemia, mild intermittent asthma, GERD, migraine headaches, hyperlipidemia, hypertension, vitamin D deficiency, history of PE on Xarelto, lung cancer undergoing palliative radiation and oral chemotherapy, but has noticed that it infusion chemotherapy due to insurance issues presented to the ED with dyspnea, pleuritic chest pain, hemoptysis, chest x-ray showed new left retrocardiac opacity.  Interval decrease in multiple lobular opacity throughout bilateral lungs suggestive of interval improvement of metastasis.  CT chest with no PE, interval decrease in multiple bilateral lung nodules as well as hilar mediastinal lymphadenopathy.  There are new groundglass changes surrounding multiple lung nodule which may represent posttreatment changes.  New small to moderate left pleural effusion with associated  compressive atelectasis changes.  There are ill-defined hypoattenuation masses in the liver and spleen highly concerning for metastasis. PCCM consulted and patient had a left side thoracentesis yielding 700 cc of bloody fluid. Cytology return negative for malignancy.   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  During hospitalization hospitalization patient received Rocephin and doxycycline and underwent underwent thoracocentesis with removal of 700 mL of bloody fluid with no malignant cells.  Will be continued on Omnicef and doxycycline to complete the course.  Chest x ray on 12/19: left base atelectasis or infiltrate with effusion. Bilateral pulmonary nodules again noted, most dominant in the right upper lung.  Patient received Lasix 10/20/2023. Currently on room air and feels better with breathing.  Was able to ambulate without supplemental oxygen.   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 Dr.  Arbutus Ped follows up with outpatient.  Plan to follow-up with oncology as outpatient.   Hyperlipidemia: Not on medication.   Hypertension: Not on medication.   Low TSH; normal free T3 and free T4.  Recommend repeat TSH in 6 weeks.    History of pulmonary embolism:  Continue Eliquis.  CT angio done 10/17/2023 with no pulmonary embolism.  Doppler ultrasound of lower extremity negative for DVT.   Thrombocytopenia;  Mild.  Latest platelet count of 81.  No evidence of bleeding during hospitalization.   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  Patient received packed RBC during hospitalization.  Latest hemoglobin at  9.7.  Monitor as outpatient.   Low normal B12. Started vitamin B12 supplement.  Will continue on discharge.   Diarrhea continue Florastor, Imodium, GI pathogen panel was negative.   Hypokalemia; Replaced.  Latest potassium of 3.6.  Given outpatient BMP follow-up.   Debility, weakness.  Seen by physical therapy during hospitalization and plan is home health PT on discharge with rolling walker.    Disposition.  At this time, patient is stable for disposition home with outpatient primary care provider and oncology follow-up..  Medical Consultants:   PCCM Oncology Dr Arbutus Ped  Procedures:    Thoracentesis on 12/18 Subjective:   Today, patient was seen and examined at bedside.  Overall feels better except for some weakness.  No shortness of breath dyspnea  Discharge Exam:   Vitals:   10/23/23 0800 10/23/23 1317  BP:  116/71  Pulse:  98  Resp: 15 19  Temp:  98.3 F (36.8 C)  SpO2:  94%   Vitals:   10/22/23 2101 10/23/23 0556 10/23/23 0800 10/23/23 1317  BP: 122/71 135/76  116/71  Pulse: 86 87  98  Resp: 14 14 15 19   Temp: 98.6 F (37 C) 98.4 F (36.9 C)  98.3 F (36.8 C)  TempSrc: Oral Oral  Oral  SpO2: 97% 94%  94%  Weight:      Height:       Body mass index is 24.21 kg/m.  General: Alert awake, not in obvious distress, Communicative HENT: pupils equally reacting to light, mild pallor noted.  Oral mucosa is moist.  Chest:  Clear breath sounds.  Diminished breath sounds bilaterally. No crackles or wheezes.  Left chest wall port in place. CVS: S1 &S2 heard. No murmur.  Regular rate and rhythm. Abdomen: Soft, nontender, nondistended.  Bowel sounds are heard.   Extremities: No cyanosis, clubbing or edema.  Peripheral pulses are palpable. Psych: Alert, awake and oriented, normal mood CNS:  No cranial nerve deficits.  Generalized weakness noted. Skin: Warm and dry.  No rashes noted.  The results of significant diagnostics from this hospitalization (including imaging,  microbiology, ancillary and laboratory) are listed below for reference.     Diagnostic Studies:   VAS [place] LOWER EXTREMITY VENOUS (DVT) Result Date: 10/19/2023  Lower Venous DVT Study Patient Name:  Rebecca Ochoa  Date of Exam:   10/18/2023 Medical Rec #: [id number]     Accession #:    [id number] Date of Birth: [birth year]      Patient Gender: F Patient Age:   61 years Exam Location:  [facility] Procedure:      VAS [place] LOWER EXTREMITY VENOUS (DVT) Referring Phys: Zenia Resides --------------------------------------------------------------------------------  Indications: Follow up exam.  Risk Factors: Metastatic lung cancer / chemotherapy. Anticoagulation: Eliquis (outpatient) & heparin (inpatient). Comparison Study: Previous exam on 09/05/2023 was positive for DVT of LLE (popv,                   ptv, and perov) Performing Technologist: Jody Hill RVT, RDMS  Examination Guidelines: A complete evaluation includes B-mode imaging, spectral Doppler, color Doppler, and power Doppler as needed of all accessible portions of each vessel. Bilateral testing is considered an integral part of a complete examination. Limited examinations for reoccurring indications may be performed as noted. The reflux portion of the exam is performed with the patient in reverse Trendelenburg.  +---------+---------------+---------+-----------+----------+--------------+ RIGHT    CompressibilityPhasicitySpontaneityPropertiesThrombus Aging +---------+---------------+---------+-----------+----------+--------------+ CFV      Full  Yes      Yes                                 +---------+---------------+---------+-----------+----------+--------------+ SFJ      Full                                                        +---------+---------------+---------+-----------+----------+--------------+ FV Prox  Full           Yes      Yes                                  +---------+---------------+---------+-----------+----------+--------------+ FV Mid   Full           Yes      Yes                                 +---------+---------------+---------+-----------+----------+--------------+ FV DistalFull           Yes      Yes                                 +---------+---------------+---------+-----------+----------+--------------+ PFV      Full                                                        +---------+---------------+---------+-----------+----------+--------------+ POP      Full           Yes      Yes                                 +---------+---------------+---------+-----------+----------+--------------+ PTV      Full                                                        +---------+---------------+---------+-----------+----------+--------------+ PERO     Full                                                        +---------+---------------+---------+-----------+----------+--------------+   +---------+---------------+---------+-----------+----------+--------------+ LEFT     CompressibilityPhasicitySpontaneityPropertiesThrombus Aging +---------+---------------+---------+-----------+----------+--------------+ CFV      Full           Yes      Yes                                 +---------+---------------+---------+-----------+----------+--------------+ SFJ      Full                                                        +---------+---------------+---------+-----------+----------+--------------+  FV Prox  Full           Yes      Yes                                 +---------+---------------+---------+-----------+----------+--------------+ FV Mid   Full           Yes      Yes                                 +---------+---------------+---------+-----------+----------+--------------+ FV DistalFull           Yes      Yes                                  +---------+---------------+---------+-----------+----------+--------------+ PFV      Full                                                        +---------+---------------+---------+-----------+----------+--------------+ POP      Full           Yes      Yes                                 +---------+---------------+---------+-----------+----------+--------------+ PTV      Full                                                        +---------+---------------+---------+-----------+----------+--------------+ PERO     Full                                                        +---------+---------------+---------+-----------+----------+--------------+     Summary: BILATERAL: - No evidence of deep vein thrombosis seen in the lower extremities, bilaterally. -No evidence of popliteal cyst, bilaterally.  LEFT: - Findings suggest resolution of previously noted thrombus.  *See table(s) above for measurements and observations. Electronically signed by Heath Lark on 10/19/2023 at 10:41:03 AM.    Final    DG Chest 1 View Result Date: 10/18/2023 CLINICAL DATA:  Hemothorax, acute respiratory failure with hypoxia. EXAM: CHEST  1 VIEW COMPARISON:  10/18/2023. FINDINGS: The heart size and mediastinal contours are stable. There is atherosclerotic calcification of the aorta. A left chest port is stable in position. Scattered opacities are noted in the lungs bilaterally and at the right hilum, similar in appearance to the prior exam. There is a small left pleural effusion. No pneumothorax is seen. Bony structures appear stable. IMPRESSION: 1. Small left pleural effusion. 2. Remaining findings are unchanged. Electronically Signed   By: Thornell Sartorius M.D.   On: 10/18/2023 21:54   DG Chest Port 1 View Result Date: 10/18/2023 CLINICAL DATA:  Post thoracentesis. EXAM: PORTABLE CHEST 1 VIEW COMPARISON:  CT 10/17/2023.  Radiographs 10/17/2023 and 09/18/2023. FINDINGS: 1233 hours. Left IJ Port-A-Cath  extends to the level of the upper right atrium. The heart size and mediastinal contours are stable. Right hilar mass and multiple pulmonary nodules in both lungs are grossly stable. Left pleural effusion may be slightly smaller. There is persistent asymmetric left lower lobe airspace disease. No evidence of pneumothorax. The bones appear unchanged. IMPRESSION: 1. No evidence of pneumothorax following thoracentesis. Left pleural effusion may be slightly smaller. 2. Stable right hilar mass and multiple pulmonary nodules consistent with known metastatic disease. Electronically Signed   By: Carey Bullocks M.D.   On: 10/18/2023 14:10   CT Angio Chest PE W and/or Wo Contrast Result Date: 10/17/2023 CLINICAL DATA:  Pulmonary embolism (PE) suspected, high prob. Shortness of breath and chest pain. * Tracking Code: BO * EXAM: CT ANGIOGRAPHY CHEST WITH CONTRAST TECHNIQUE: Multidetector CT imaging of the chest was performed using the standard protocol during bolus administration of intravenous contrast. Multiplanar CT image reconstructions and MIPs were obtained to evaluate the vascular anatomy. RADIATION DOSE REDUCTION: This exam was performed according to the departmental dose-optimization program which includes automated exposure control, adjustment of the mA and/or kV according to patient size and/or use of iterative reconstruction technique. CONTRAST:  75mL OMNIPAQUE IOHEXOL 350 MG/ML SOLN COMPARISON:  CT angiography chest from 09/12/2023. FINDINGS: Cardiovascular: Evaluation of pulmonary embolism beyond the segmental branches is limited due to patient's respiratory motion. There is no embolism to the segmental pulmonary artery level. Normal cardiac size. No pericardial effusion. No aortic aneurysm. There are coronary artery calcifications, in keeping with coronary artery disease. There are also mild peripheral atherosclerotic vascular calcifications of thoracic aorta and its major branches. Mediastinum/Nodes: There  is a 0.8 x 1.1 cm hypoattenuating nodule in the inferior right thyroid lobe, which is incompletely characterized on the current examination but appears unchanged since the prior study. Redemonstration of multiple enlarged hilar and mediastinal lymph nodes, compatible with metastases. There is mild interval decrease in the dominant right hilar nodal mass which currently measures 4.4 x 5.6 cm, previously 5.2 x 7.0 cm. No axillary lymphadenopathy by size criteria. The esophagus is nondistended precluding optimal assessment. Lungs/Pleura: There is linear filling defect along the lower portion of the lower trachea extending into the right main bronchus, likely mucus/secretion. The central tracheo-bronchial tree is otherwise patent. Since the prior study, there is new small-to-moderate left pleural effusion with associated compressive atelectatic changes. Redemonstration of multiple bilateral lobulated pulmonary masses with largest in the right lung apex measuring 3.5 x 4.6 cm, which previously measured 3.9 x 5.1 cm, when remeasured in similar fashion. The nodule exhibits new central cavitation/Re-aeration of lung parenchyma. No right pleural effusion. No pneumothorax on either side. Many nodules exhibit new surrounding ground-glass changes. Upper Abdomen: There are persistent ill-defined, hypoattenuating masses in the liver with largest in the left hepatic lobe, segment 2 measuring 5.0 x 8.1 cm. These are incompletely characterized on the current examination but highly concerning for metastases. There are ill-defined hypoattenuating lesions in the spleen as well, also favored to represent metastases. Remaining visualized upper abdominal viscera within normal limits. Musculoskeletal: A CT Port-a-Cath is seen in the left upper chest wall with the catheter terminating in the cavo-atrial junction region. Visualized soft tissues of the chest wall are otherwise grossly unremarkable. No suspicious osseous lesions. There are  mild multilevel degenerative changes in the visualized spine. Review of the MIP images confirms the above findings. IMPRESSION: 1. No embolism to the segmental pulmonary artery level. 2.  Interval decrease in the multiple bilateral lung nodules as well as hilar/mediastinal lymphadenopathy. Also there are new ground-glass changes surrounding multiple lung nodules, which may also represent posttreatment changes. Please see above for details. 3. There is new small-to-moderate left pleural effusion with associated compressive atelectatic changes. No right pleural effusion. 4. There are ill-defined hypoattenuating masses in the liver and spleen, highly concerning for metastases. Further imaging with dedicated contrast-enhanced MRI abdomen as per liver mass protocol is recommended. 5. Multiple other nonacute observations, as described above. Aortic Atherosclerosis (ICD10-I70.0). Electronically Signed   By: Jules Schick M.D.   On: 10/17/2023 13:41   DG Chest Port 1 View Result Date: 10/17/2023 CLINICAL DATA:  Dyspnea.  Shortness of breath and chest pain. EXAM: PORTABLE CHEST 1 VIEW COMPARISON:  09/18/2023. FINDINGS: Multiple lobular opacities are again seen throughout bilateral lungs, right more than left, compatible with patient's known history of metastases. There is interval decrease in the size of the lesions when compared to the prior radiograph from 09/18/2023. However, since the prior study, there is new left retrocardiac airspace opacity obscuring the left hemidiaphragm, descending thoracic aorta and blunting the left lateral costophrenic angle suggesting combination of left lower lobe atelectasis and/or consolidation with pleural effusion. Right lateral costophrenic angle is clear. Stable cardio-mediastinal silhouette. No acute osseous abnormalities. The soft tissues are within normal limits. Redemonstration of left-sided CT Port-A-Cath with its tip overlying the cavoatrial junction region, unchanged.  IMPRESSION: *New left retrocardiac opacity, as described above. *Interval decrease in the multiple lobular opacities throughout bilateral lungs, suggesting interval improvement in the metastases. Electronically Signed   By: Jules Schick M.D.   On: 10/17/2023 11:57     Labs:   Basic Metabolic Panel: Recent Labs  Lab 10/17/23 0931 10/17/23 0943 10/18/23 0345 10/19/23 0712 10/20/23 0742 10/21/23 0528 10/23/23 0500  NA 138   < > 135 135 132* 139 139  K 3.2*   < > 3.6 3.3* 3.4* 3.8 3.6  CL 102   < > 106 101 98 103 104  CO2 27  --  24 27 27 29 27   GLUCOSE 98   < > 106* 120* 106* 119* 111*  BUN 17   < > 17 18 23  29* 23  CREATININE 0.56   < > 0.50 0.48 0.45 0.54 0.63  CALCIUM 8.7*  --  8.1* 8.7* 8.7* 8.8* 9.1  MG 2.4  --   --   --   --   --   --    < > = values in this interval not displayed.   GFR Estimated Creatinine Clearance: 65.6 mL/min (by C-G formula based on SCr of 0.63 mg/dL). Liver Function Tests: Recent Labs  Lab 10/17/23 0931 10/18/23 0345 10/18/23 1243 10/19/23 0712  AST 49* 36  --  26  ALT 106* 85*  --  61*  ALKPHOS 117 100  --  97  BILITOT 1.4* 1.3*  --  1.1  PROT 5.8* 5.0* 5.1* 5.0*  ALBUMIN 3.0* 2.6*  --  2.6*   No results for input(s): "LIPASE", "AMYLASE" in the last 168 hours. No results for input(s): "AMMONIA" in the last 168 hours. Coagulation profile No results for input(s): "INR", "PROTIME" in the last 168 hours.  CBC: Recent Labs  Lab 10/17/23 0931 10/17/23 0943 10/18/23 1622 10/19/23 0712 10/19/23 2120 10/20/23 0515 10/21/23 0528 10/23/23 0500  WBC 12.2*   < > 8.6 7.4  --  8.0 6.6 8.7  NEUTROABS 11.3*  --   --   --   --   --   --   --  HGB 9.5*   < > 8.0* 7.1* 10.7* 10.1* 9.7* 10.9*  HCT 31.1*   < > 25.8* 23.5* 32.9* 30.3* 30.1* 34.4*  MCV 92.0   < > 92.1 93.3  --  90.7 92.6 92.5  PLT 94*   < > 76* 74*  --  61* 66* 81*   < > = values in this interval not displayed.   Cardiac Enzymes: No results for input(s): "CKTOTAL", "CKMB",  "CKMBINDEX", "TROPONINI" in the last 168 hours. BNP: Invalid input(s): "POCBNP" CBG: No results for input(s): "GLUCAP" in the last 168 hours. D-Dimer No results for input(s): "DDIMER" in the last 72 hours. Hgb A1c No results for input(s): "HGBA1C" in the last 72 hours. Lipid Profile No results for input(s): "CHOL", "HDL", "LDLCALC", "TRIG", "CHOLHDL", "LDLDIRECT" in the last 72 hours. Thyroid function studies No results for input(s): "TSH", "T4TOTAL", "T3FREE", "THYROIDAB" in the last 72 hours.  Invalid input(s): "FREET3" Anemia work up No results for input(s): "VITAMINB12", "FOLATE", "FERRITIN", "TIBC", "IRON", "RETICCTPCT" in the last 72 hours. Microbiology Recent Results (from the past 240 hours)  Resp panel by RT-PCR (RSV, Flu A&B, Covid) Anterior Nasal Swab     Status: None   Collection Time: 10/17/23  9:31 AM   Specimen: Anterior Nasal Swab  Result Value Ref Range Status   SARS Coronavirus 2 by RT PCR NEGATIVE NEGATIVE Final    Comment: (NOTE) SARS-CoV-2 target nucleic acids are NOT DETECTED.  The SARS-CoV-2 RNA is generally detectable in upper respiratory specimens during the acute phase of infection. The lowest concentration of SARS-CoV-2 viral copies this assay can detect is 138 copies/mL. A negative result does not preclude SARS-Cov-2 infection and should not be used as the sole basis for treatment or other patient management decisions. A negative result may occur with  improper specimen collection/handling, submission of specimen other than nasopharyngeal swab, presence of viral mutation(s) within the areas targeted by this assay, and inadequate number of viral copies(<138 copies/mL). A negative result must be combined with clinical observations, patient history, and epidemiological information. The expected result is Negative.  Fact Sheet for Patients:  BloggerCourse.com  Fact Sheet for Healthcare Providers:   SeriousBroker.it  This test is no t yet approved or cleared by the [place] FDA and  has been authorized for detection and/or diagnosis of SARS-CoV-2 by FDA under an Emergency Use Authorization (EUA). This EUA will remain  in effect (meaning this test can be used) for the duration of the COVID-19 declaration under Section 564(b)(1) of the Act, 21 U.S.C.section 360bbb-3(b)(1), unless the authorization is terminated  or revoked sooner.       Influenza A by PCR NEGATIVE NEGATIVE Final   Influenza B by PCR NEGATIVE NEGATIVE Final    Comment: (NOTE) The Xpert Xpress SARS-CoV-2/FLU/RSV plus assay is intended as an aid in the diagnosis of influenza from Nasopharyngeal swab specimens and should not be used as a sole basis for treatment. Nasal washings and aspirates are unacceptable for Xpert Xpress SARS-CoV-2/FLU/RSV testing.  Fact Sheet for Patients: BloggerCourse.com  Fact Sheet for Healthcare Providers: SeriousBroker.it  This test is not yet approved or cleared by the [place] FDA and has been authorized for detection and/or diagnosis of SARS-CoV-2 by FDA under an Emergency Use Authorization (EUA). This EUA will remain in effect (meaning this test can be used) for the duration of the COVID-19 declaration under Section 564(b)(1) of the Act, 21 U.S.C. section 360bbb-3(b)(1), unless the authorization is terminated or revoked.     Resp Syncytial Virus  by PCR NEGATIVE NEGATIVE Final    Comment: (NOTE) Fact Sheet for Patients: BloggerCourse.com  Fact Sheet for Healthcare Providers: SeriousBroker.it  This test is not yet approved or cleared by the [place] FDA and has been authorized for detection and/or diagnosis of SARS-CoV-2 by FDA under an Emergency Use Authorization (EUA). This EUA will remain in effect (meaning this test can be used) for  the duration of the COVID-19 declaration under Section 564(b)(1) of the Act, 21 U.S.C. section 360bbb-3(b)(1), unless the authorization is terminated or revoked.  Performed at [facility], [address] [place], [place] [address]   Respiratory (~20 pathogens) panel by PCR     Status: None   Collection Time: 10/17/23  9:31 AM   Specimen: Nasopharyngeal Swab; Respiratory  Result Value Ref Range Status   Adenovirus NOT DETECTED NOT DETECTED Final   Coronavirus 229E NOT DETECTED NOT DETECTED Final    Comment: (NOTE) The Coronavirus on the Respiratory Panel, DOES NOT test for the novel  Coronavirus (2019 nCoV)    Coronavirus HKU1 NOT DETECTED NOT DETECTED Final   Coronavirus NL63 NOT DETECTED NOT DETECTED Final   Coronavirus OC43 NOT DETECTED NOT DETECTED Final   Metapneumovirus NOT DETECTED NOT DETECTED Final   Rhinovirus / Enterovirus NOT DETECTED NOT DETECTED Final   Influenza A NOT DETECTED NOT DETECTED Final   Influenza B NOT DETECTED NOT DETECTED Final   Parainfluenza Virus 1 NOT DETECTED NOT DETECTED Final   Parainfluenza Virus 2 NOT DETECTED NOT DETECTED Final   Parainfluenza Virus 3 NOT DETECTED NOT DETECTED Final   Parainfluenza Virus 4 NOT DETECTED NOT DETECTED Final   Respiratory Syncytial Virus NOT DETECTED NOT DETECTED Final   Bordetella pertussis NOT DETECTED NOT DETECTED Final   Bordetella Parapertussis NOT DETECTED NOT DETECTED Final   Chlamydophila pneumoniae NOT DETECTED NOT DETECTED Final   Mycoplasma pneumoniae NOT DETECTED NOT DETECTED Final    Comment: Performed at [facility], [address], [place], [place] [address]  MRSA Next Gen by PCR, Nasal     Status: None   Collection Time: 10/17/23 11:56 PM   Specimen: Nasal Mucosa; Nasal Swab  Result Value Ref Range Status   MRSA by PCR Next Gen NOT DETECTED NOT DETECTED Final    Comment: (NOTE) The GeneXpert MRSA Assay (FDA approved for NASAL specimens only), is one component of a  comprehensive MRSA colonization surveillance program. It is not intended to diagnose MRSA infection nor to guide or monitor treatment for MRSA infections. Test performance is not FDA approved in patients less than 2 years old. Performed at [facility], [address] [place], [place] [address]   Body fluid culture w Gram Stain     Status: None   Collection Time: 10/18/23 11:56 AM   Specimen: Pleura; Body Fluid  Result Value Ref Range Status   Specimen Description   Final    PLEURAL Performed at [facility], [address], [place], [place] [address]    Special Requests   Final    NONE Performed at [facility], [address], [place], [place] [address]    Gram Stain   Final    RARE WBC PRESENT,BOTH PMN AND MONONUCLEAR NO ORGANISMS SEEN    Culture   Final    NO GROWTH 3 DAYS Performed at [facility], [address], [place], [place] [address]    Report Status 10/22/2023 FINAL  Final  Acid Fast Smear (  AFB)     Status: None   Collection Time: 10/18/23 11:59 AM   Specimen: Pleural, Left; Pleural Fluid  Result Value Ref Range Status   AFB Specimen Processing Concentration  Final   Acid Fast Smear Negative  Final    Comment: (NOTE) Performed At: [facility] [address] [place], [place] [id number] Jolene Schimke MD KG:[id number]    Source (AFB) PLEURAL  Final    Comment: Performed at [facility], [address] [place], [place] [address]  Gastrointestinal Panel by PCR , Stool     Status: None   Collection Time: 10/21/23 10:23 AM   Specimen: Stool  Result Value Ref Range Status   Campylobacter species NOT DETECTED NOT DETECTED Final   Plesimonas shigelloides NOT DETECTED NOT DETECTED Final   Salmonella species NOT DETECTED NOT DETECTED Final   Yersinia enterocolitica NOT DETECTED NOT DETECTED Final   Vibrio species NOT DETECTED NOT DETECTED Final   Vibrio cholerae NOT  DETECTED NOT DETECTED Final   Enteroaggregative E coli (EAEC) NOT DETECTED NOT DETECTED Final   Enteropathogenic E coli (EPEC) NOT DETECTED NOT DETECTED Final   Enterotoxigenic E coli (ETEC) NOT DETECTED NOT DETECTED Final   Shiga like toxin producing E coli (STEC) NOT DETECTED NOT DETECTED Final   Shigella/Enteroinvasive E coli (EIEC) NOT DETECTED NOT DETECTED Final   Cryptosporidium NOT DETECTED NOT DETECTED Final   Cyclospora cayetanensis NOT DETECTED NOT DETECTED Final   Entamoeba histolytica NOT DETECTED NOT DETECTED Final   Giardia lamblia NOT DETECTED NOT DETECTED Final   Adenovirus F40/41 NOT DETECTED NOT DETECTED Final   Astrovirus NOT DETECTED NOT DETECTED Final   Norovirus GI/GII NOT DETECTED NOT DETECTED Final   Rotavirus A NOT DETECTED NOT DETECTED Final   Sapovirus (I, II, IV, and V) NOT DETECTED NOT DETECTED Final    Comment: Performed at [facility], [address], [place], [place] [address]     Discharge Instructions:   Discharge Instructions     Call MD for:  severe uncontrolled pain   Complete by: As directed    Call MD for:  temperature >100.4   Complete by: As directed    Diet general   Complete by: As directed    Discharge instructions   Complete by: As directed    Follow-up with your primary care provider as outpatient in 1 week. Check blood work at that time.  Complete the course of antibiotic.  Seek medical attention for worsening symptoms.  Avoid overexertion. Follow up with Dr Arbutus Ped, oncology as scheduled by you.   Increase activity slowly   Complete by: As directed    Clinic Appointment Request   Complete by: Oct 31, 2023    Contact your oncology clinic or infusion center to schedule this appointment.   Infusion Appointment Request   Complete by: Oct 31, 2023    Contact your oncology clinic or infusion center to schedule this appointment.   Lab Appointment Request   Complete by: Oct 31, 2023    Patient requires a Lab or Flush  Appointment?: Flush Appointment   Contact your oncology clinic or infusion center to schedule this appointment.      Allergies as of 10/23/2023       Reactions   Biaxin [clarithromycin] Nausea Only        Medication List     TAKE these medications    acetaminophen 500 MG tablet Commonly known as: TYLENOL Take 1,000 mg by mouth every 8 (eight) hours  as needed for mild pain.   albuterol (2.5 MG/3ML) 0.083% nebulizer solution Commonly known as: PROVENTIL Take 3 mLs (2.5 mg total) by nebulization every 4 (four) hours as needed for wheezing or shortness of breath.   ALPRAZolam 0.5 MG tablet Commonly known as: XANAX Take 1 tablet (0.5 mg total) by mouth 3 (three) times daily as needed for anxiety.   apixaban 5 MG Tabs tablet Commonly known as: ELIQUIS Take 1 tablet (5 mg total) by mouth 2 (two) times daily.   cefdinir 300 MG capsule Commonly known as: OMNICEF Take 1 capsule (300 mg total) by mouth 2 (two) times daily for 3 days.   cyanocobalamin 500 MCG tablet Commonly known as: VITAMIN B12 Take 1 tablet (500 mcg total) by mouth daily.   dexamethasone 2 MG tablet Commonly known as: DECADRON Take 2 tablets (4 mg total) by mouth daily for 7 days, THEN 1 tablet (2 mg total) daily for 7 days. Start taking on: October 23, 2023 What changed:  medication strength See the new instructions.   doxycycline 100 MG tablet Commonly known as: VIBRA-TABS Take 1 tablet (100 mg total) by mouth every 12 (twelve) hours for 3 days.   feeding supplement Liqd Take 237 mLs by mouth 2 (two) times daily between meals.   folic acid 1 MG tablet Commonly known as: FOLVITE Take 1 tablet (1 mg total) by mouth daily.   HYDROcodone-acetaminophen 5-325 MG tablet Commonly known as: NORCO/VICODIN Take 1 tablet by mouth every 6 (six) hours as needed. What changed: reasons to take this   loperamide 2 MG capsule Commonly known as: IMODIUM Take 1 capsule (2 mg total) by mouth as needed for  diarrhea or loose stools.   ondansetron 8 MG tablet Commonly known as: ZOFRAN Take 8 mg by mouth every 8 (eight) hours as needed for nausea or vomiting.   ondansetron 4 MG tablet Commonly known as: ZOFRAN Take 1 tablet (4 mg total) by mouth every 6 (six) hours as needed for nausea.   pantoprazole 40 MG tablet Commonly known as: PROTONIX Take 1 tablet (40 mg total) by mouth 2 (two) times daily.   prochlorperazine 10 MG tablet Commonly known as: COMPAZINE Take 10 mg by mouth every 6 (six) hours as needed for nausea or vomiting.   saccharomyces boulardii 250 MG capsule Commonly known as: FLORASTOR Take 1 capsule (250 mg total) by mouth 2 (two) times daily.   Tagrisso 80 MG tablet Generic drug: osimertinib mesylate Take 80 mg by mouth in the morning. Resume home med with Tagrisso every day.   temazepam 15 MG capsule Commonly known as: RESTORIL Take 1 capsule (15 mg total) by mouth at bedtime as needed for sleep. What changed:  how much to take when to take this               Durable Medical Equipment  (From admission, onward)           Start     Ordered   10/23/23 1048  For home use only DME 4 wheeled rolling walker with seat  Once       Question:  Patient needs a walker to treat with the following condition  Answer:  Ambulatory dysfunction   10/23/23 1048            Follow-up Information     [name], MD Follow up in 1 week(s).   Specialty: Internal Medicine Contact information: [address] [place] [address] [phone]  Time coordinating discharge: 39 minutes  Signed:  Marijose Curington  [facility] 10/23/2023, 3:51 PM

## 2023-10-24 ENCOUNTER — Ambulatory Visit: Payer: No Typology Code available for payment source | Admitting: Internal Medicine

## 2023-10-24 ENCOUNTER — Other Ambulatory Visit: Payer: No Typology Code available for payment source

## 2023-10-24 ENCOUNTER — Telehealth: Payer: Self-pay

## 2023-10-24 NOTE — Transitions of Care (Post Inpatient/ED Visit) (Signed)
   10/24/2023  Name: Breean Draves MRN: [id number] DOB: 1960-11-17  Today's TOC FU Call Status: Today's TOC FU Call Status:: Unsuccessful Call (1st Attempt) Unsuccessful Call (1st Attempt) Date: 10/24/23  Attempted to reach the patient regarding the most recent Inpatient/ED visit.  Follow Up Plan: Additional outreach attempts will be made to reach the patient to complete the Transitions of Care (Post Inpatient/ED visit) call.     Antionette Fairy, RN,BSN,CCM RN Care Manager Transitions of Care  [facility]  Direct Phone: [phone] Toll Free: [phone] Fax: [phone]

## 2023-10-26 ENCOUNTER — Ambulatory Visit: Payer: No Typology Code available for payment source

## 2023-10-26 ENCOUNTER — Telehealth: Payer: Self-pay

## 2023-10-26 NOTE — Progress Notes (Signed)
 Palliative Medicine [facility]  [phone] [phone]   Name: Rebecca Ochoa Date: 10/26/2023 MRN: [id number]  DOB: 07/06/1961  Patient Care Team: Tonita Fallow, MD as PCP - General (Internal Medicine) Alvan Ronal BRAVO, MD as PCP - Cardiology (Cardiology) Manford Elspeth ORN, MD as Referring Physician (Optometry) Debrah Ade, MD as Consulting Physician (Obstetrics and Gynecology) Joshua Sieving, MD as Consulting Physician (Dermatology) Prentis Duwaine BROCKS, RN as Registered Nurse Tonita Fallow, MD as Referring Physician (Internal Medicine) Pickenpack-Cousar, Fannie SAILOR, NP as Nurse Practitioner (Hospice and Palliative Medicine)    INTERVAL HISTORY: Rebecca Ochoa is a 62 y.o. female with oncologic medical history including non-small cell lung cancer (08/2023) and innumerable hypermetabolic pulmonary metastatic lesions in addition to liver and bone metastasis as well as abdominal celiac axis lymphadenopathy.  Palliative ask to see for symptom management and goals of care.   SOCIAL HISTORY:     reports that she has been smoking e-cigarettes. She has never used smokeless tobacco. She reports current alcohol use of about 3.0 standard drinks of alcohol per week. She reports that she does not use drugs.  ADVANCE DIRECTIVES:  None on file  CODE STATUS: Full code  PAST MEDICAL HISTORY: Past Medical History:  Diagnosis Date   Anemia    Asthma, mild intermittent, well-controlled    GERD (gastroesophageal reflux disease)    Hx of migraines    Hyperlipidemia    Hypertension    Vitamin D  deficiency     ALLERGIES:  is allergic to biaxin [clarithromycin].  MEDICATIONS:  Current Outpatient Medications  Medication Sig Dispense Refill   acetaminophen  (TYLENOL ) 500 MG tablet Take 1,000 mg by mouth every 8 (eight) hours as needed for mild pain.     albuterol  (PROVENTIL ) (2.5 MG/3ML) 0.083% nebulizer solution Take 3 mLs (2.5 mg total) by  nebulization every 4 (four) hours as needed for wheezing or shortness of breath. 75 mL 12   ALPRAZolam  (XANAX ) 0.5 MG tablet Take 1 tablet (0.5 mg total) by mouth 3 (three) times daily as needed for anxiety. 60 tablet 0   apixaban  (ELIQUIS ) 5 MG TABS tablet Take 1 tablet (5 mg total) by mouth 2 (two) times daily. 60 tablet 1   cefdinir  (OMNICEF ) 300 MG capsule Take 1 capsule (300 mg total) by mouth 2 (two) times daily for 3 days. 6 capsule 0   cyanocobalamin  (VITAMIN B12) 500 MCG tablet Take 1 tablet (500 mcg total) by mouth daily. 100 tablet 0   dexamethasone  (DECADRON ) 2 MG tablet Take 2 tablets (4 mg total) by mouth daily for 7 days, THEN 1 tablet (2 mg total) daily for 7 days.     doxycycline  (VIBRA -TABS) 100 MG tablet Take 1 tablet (100 mg total) by mouth every 12 (twelve) hours for 3 days. 6 tablet 0   feeding supplement (ENSURE ENLIVE / ENSURE PLUS) LIQD Take 237 mLs by mouth 2 (two) times daily between meals. 237 mL 12   folic acid  (FOLVITE ) 1 MG tablet Take 1 tablet (1 mg total) by mouth daily. 30 tablet 2   HYDROcodone -acetaminophen  (NORCO/VICODIN) 5-325 MG tablet Take 1 tablet by mouth every 6 (six) hours as needed. (Patient taking differently: Take 1 tablet by mouth every 6 (six) hours as needed (for pain).) 21 tablet 0   loperamide  (IMODIUM ) 2 MG capsule Take 1 capsule (2 mg total) by mouth as needed for diarrhea or loose stools. 30 capsule 0   ondansetron  (ZOFRAN ) 4 MG tablet Take 1 tablet (4 mg  total) by mouth every 6 (six) hours as needed for nausea. (Patient not taking: Reported on 10/17/2023) 20 tablet 0   ondansetron  (ZOFRAN ) 8 MG tablet Take 8 mg by mouth every 8 (eight) hours as needed for nausea or vomiting.     pantoprazole  (PROTONIX ) 40 MG tablet Take 1 tablet (40 mg total) by mouth 2 (two) times daily. 30 tablet 2   prochlorperazine  (COMPAZINE ) 10 MG tablet Take 10 mg by mouth every 6 (six) hours as needed for nausea or vomiting.     saccharomyces boulardii (FLORASTOR) 250 MG  capsule Take 1 capsule (250 mg total) by mouth 2 (two) times daily. 60 capsule 0   TAGRISSO  80 MG tablet Take 80 mg by mouth in the morning. Resume home med with Tagrisso  every day.     temazepam  (RESTORIL ) 15 MG capsule Take 1 capsule (15 mg total) by mouth at bedtime as needed for sleep. (Patient taking differently: Take 30 mg by mouth at bedtime.) 30 capsule 0   No current facility-administered medications for this visit.    VITAL SIGNS: There were no vitals taken for this visit. There were no vitals filed for this visit.  Estimated body mass index is 24.21 kg/m as calculated from the following:   Height as of 10/21/23: 5' 5 (1.651 m).   Weight as of 10/21/23: 145 lb 8.1 oz (66 kg).   PERFORMANCE STATUS (ECOG) : 1 - Symptomatic but completely ambulatory   Physical Exam General: NAD Cardiovascular: regular rate and rhythm Pulmonary: clear ant fields Abdomen: soft, nontender, + bowel sounds Extremities: no edema, no joint deformities Skin: no rashes Neurological: AAO x3  Discussed the use of AI scribe software for clinical note transcription with the patient, who gave verbal consent to proceed.   IMPRESSION:  Mrs. Batres presents to clinic for follow-up. Continues to take things one day at a time. Denies nausea or vomiting. Remains challenged with ongoing diarrhea. She reported experiencing diarrhea, which was frequent, approximately four to five times a day, and described as very loose. The patient noted that her stomach was making noises throughout the day and felt 'angry.' She had been taking Imodium , up to four a day, to manage the diarrhea. At max she has taken up to 4 tablets daily. Advised she can take up to 8 tablets daily for loose stools. She is also concerned about becoming constipated. Knows to closely monitor. Additionally, the patient reported a raw area on her buttocks, which she attributed to frequent diarrhea. She had been using a cream provided by the hospital,  which was not effective in resolving the issue. Recommended use of mild soap, water, and wipes. Also recommended use of skin barrier such as Desitin ointment for skin protection and healing. She verbalized understanding.   Her potassium is 2.5. She is aware will be receiving IV infusion of potassium in addition to home oral dosing. She expressed some difficulty in taking large pills. Will plan to take with applesauce or pudding to better tolerate which worked in the past.   We discussed Mrs. Vanderschaaf's ongoing insomnia. Husband reports some noticeable improvement with increase of Restoril  to 30-mg however she is waking up within several hours. She is able to easily fall asleep. We discussed continuing for several more weeks and closely evaluating. Patient did try melatonin however no significant difference.   We will plan to continue close monitoring and support as needed.  I discussed the importance of continued conversation with family and their medical providers regarding overall  plan of care and treatment options, ensuring decisions are within the context of the patients values and GOCs.  PLAN: Hypokalemia Potassium level at 2.5. Diarrhea likely contributing to low potassium levels. -Administer IV potassium today per Oncology. -Start oral potassium supplementation per Oncology.   Diarrhea Frequent loose stools, up to 4-5 times a day. Currently taking up to 4 Imodium  a day. -Increase Imodium  as needed, up to 8 a day as advised.   Insomnia Difficulty maintaining sleep, despite taking Restoril  30mg . Melatonin ineffective. -Continue Restoril  30mg  for two more weeks. -Consider alternative sleep aid if no improvement in two weeks.  Perianal skin irritation Raw area on buttocks due to frequent diarrhea. -Apply Desitin cream after each bowel movement. -Use gentle wipes for cleaning after bowel movements.  Follow-up Unclear when next appointment is scheduled. -Schedule follow-up appointment  on 11/20/2022 for monitoring of potassium levels and response to increased Imodium  and Restoril .  Patient expressed understanding and was in agreement with this plan. She also understands that She can call the clinic at any time with any questions, concerns, or complaints.   Any controlled substances utilized were prescribed in the context of palliative care. PDMP has been reviewed.   Visit consisted of counseling and education dealing with the complex and emotionally intense issues of symptom management and palliative care in the setting of serious and potentially life-threatening illness.  Levon Borer, AGPCNP-BC  Palliative Medicine Team/[facility]

## 2023-10-26 NOTE — Transitions of Care (Post Inpatient/ED Visit) (Signed)
    Name: Rebecca Ochoa MRN: [id number] DOB: 06-Feb-1961  Today's TOC FU Call Status: Today's TOC FU Call Status:: Unsuccessful Call (2nd Attempt) Unsuccessful Call (2nd Attempt) Date: 10/26/23  Attempted to reach the patient regarding the most recent Inpatient/ED visit.  Follow Up Plan: Additional outreach attempts will be made to reach the patient to complete the Transitions of Care (Post Inpatient/ED visit) call.   Alyse Low, RN, BA, [facility], CRRN [facility] Coordinator, Transition of Care Ph # [phone]

## 2023-10-27 ENCOUNTER — Telehealth: Payer: Self-pay

## 2023-10-27 ENCOUNTER — Other Ambulatory Visit: Payer: Self-pay

## 2023-10-27 MED ORDER — TEMAZEPAM 30 MG PO CAPS: 30.0000 mg | ORAL_CAPSULE | Freq: Every evening | ORAL | 0 refills | Status: DC | PRN

## 2023-10-27 NOTE — Addendum Note (Signed)
Encounter addended by: Edward Qualia on: 10/27/2023 3:38 PM  Actions taken: Imaging Exam ended

## 2023-10-27 NOTE — Telephone Encounter (Signed)
Pt called for medication refill, pt taking 2, 15mg  caps daily, medication sent in 1, 30mg  cap daily instead. Pt educated that it is the same dose of medication, verbalized understanding. No further needs at this time.

## 2023-10-27 NOTE — Transitions of Care (Post Inpatient/ED Visit) (Signed)
   10/27/2023  Name: Rebecca Ochoa MRN: [id number] DOB: 1961/04/21  Today's TOC FU Call Status: Today's TOC FU Call Status:: Unsuccessful Call (3rd Attempt) Unsuccessful Call (3rd Attempt) Date: 10/27/23  Attempted to reach the patient regarding the most recent Inpatient/ED visit.  Follow Up Plan: No further outreach attempts will be made at this time. We have been unable to contact the patient.  Alyse Low, RN, BA, [facility], CRRN [facility] Coordinator, Transition of Care Ph # [phone]

## 2023-10-30 MED FILL — Fosaprepitant Dimeglumine For IV Infusion 150 MG (Base Eq): INTRAVENOUS | Qty: 5 | Status: AC

## 2023-10-31 ENCOUNTER — Inpatient Hospital Stay (HOSPITAL_BASED_OUTPATIENT_CLINIC_OR_DEPARTMENT_OTHER): Payer: No Typology Code available for payment source | Admitting: Internal Medicine

## 2023-10-31 ENCOUNTER — Other Ambulatory Visit: Payer: Self-pay | Admitting: Internal Medicine

## 2023-10-31 ENCOUNTER — Inpatient Hospital Stay: Payer: No Typology Code available for payment source

## 2023-10-31 ENCOUNTER — Other Ambulatory Visit: Payer: No Typology Code available for payment source

## 2023-10-31 ENCOUNTER — Encounter: Payer: Self-pay | Admitting: Nurse Practitioner

## 2023-10-31 ENCOUNTER — Inpatient Hospital Stay (HOSPITAL_BASED_OUTPATIENT_CLINIC_OR_DEPARTMENT_OTHER): Payer: No Typology Code available for payment source | Admitting: Nurse Practitioner

## 2023-10-31 ENCOUNTER — Telehealth: Payer: Self-pay | Admitting: *Deleted

## 2023-10-31 VITALS — BP 119/68 | HR 92 | Temp 98.6°F

## 2023-10-31 VITALS — BP 111/74 | HR 118 | Temp 97.5°F | Resp 17 | Wt 147.1 lb

## 2023-10-31 DIAGNOSIS — J358 Other chronic diseases of tonsils and adenoids: Secondary | ICD-10-CM | POA: Diagnosis not present

## 2023-10-31 DIAGNOSIS — R197 Diarrhea, unspecified: Secondary | ICD-10-CM

## 2023-10-31 DIAGNOSIS — C3490 Malignant neoplasm of unspecified part of unspecified bronchus or lung: Secondary | ICD-10-CM

## 2023-10-31 DIAGNOSIS — C3411 Malignant neoplasm of upper lobe, right bronchus or lung: Secondary | ICD-10-CM

## 2023-10-31 DIAGNOSIS — E876 Hypokalemia: Secondary | ICD-10-CM

## 2023-10-31 DIAGNOSIS — F17290 Nicotine dependence, other tobacco product, uncomplicated: Secondary | ICD-10-CM | POA: Diagnosis not present

## 2023-10-31 DIAGNOSIS — Z515 Encounter for palliative care: Secondary | ICD-10-CM | POA: Diagnosis not present

## 2023-10-31 DIAGNOSIS — G4700 Insomnia, unspecified: Secondary | ICD-10-CM | POA: Diagnosis not present

## 2023-10-31 DIAGNOSIS — Z95828 Presence of other vascular implants and grafts: Secondary | ICD-10-CM

## 2023-10-31 DIAGNOSIS — C787 Secondary malignant neoplasm of liver and intrahepatic bile duct: Secondary | ICD-10-CM | POA: Diagnosis present

## 2023-10-31 DIAGNOSIS — C7951 Secondary malignant neoplasm of bone: Secondary | ICD-10-CM | POA: Diagnosis present

## 2023-10-31 DIAGNOSIS — Z7952 Long term (current) use of systemic steroids: Secondary | ICD-10-CM | POA: Diagnosis not present

## 2023-10-31 DIAGNOSIS — R530 Neoplastic (malignant) related fatigue: Secondary | ICD-10-CM

## 2023-10-31 DIAGNOSIS — Z79899 Other long term (current) drug therapy: Secondary | ICD-10-CM | POA: Diagnosis not present

## 2023-10-31 DIAGNOSIS — Z7901 Long term (current) use of anticoagulants: Secondary | ICD-10-CM | POA: Diagnosis not present

## 2023-10-31 LAB — CMP (CANCER CENTER ONLY)
ALT: 52 U/L — ABNORMAL HIGH (ref 0–44)
AST: 31 U/L (ref 15–41)
Albumin: 3 g/dL — ABNORMAL LOW (ref 3.5–5.0)
Alkaline Phosphatase: 124 U/L (ref 38–126)
Anion gap: 7 (ref 5–15)
BUN: 9 mg/dL (ref 8–23)
CO2: 32 mmol/L (ref 22–32)
Calcium: 8.5 mg/dL — ABNORMAL LOW (ref 8.9–10.3)
Chloride: 101 mmol/L (ref 98–111)
Creatinine: 0.49 mg/dL (ref 0.44–1.00)
GFR, Estimated: >60 mL/min (ref >60)
Glucose, Bld: 127 mg/dL — ABNORMAL HIGH (ref 70–99)
Potassium: 2.5 mmol/L — CL (ref 3.5–5.1)
Sodium: 140 mmol/L (ref 135–145)
Total Bilirubin: 0.8 mg/dL (ref 0.0–1.2)
Total Protein: 5.4 g/dL — ABNORMAL LOW (ref 6.5–8.1)

## 2023-10-31 LAB — CBC WITH DIFFERENTIAL (CANCER CENTER ONLY)
Abs Immature Granulocytes: 0.05 10*3/uL (ref 0.00–0.07)
Basophils Absolute: 0 10*3/uL (ref 0.0–0.1)
Basophils Relative: 0 %
Eosinophils Absolute: 0 10*3/uL (ref 0.0–0.5)
Eosinophils Relative: 0 %
HCT: 29 % — ABNORMAL LOW (ref 36.0–46.0)
Hemoglobin: 9.3 g/dL — ABNORMAL LOW (ref 12.0–15.0)
Immature Granulocytes: 1 %
Lymphocytes Relative: 6 %
Lymphs Abs: 0.4 10*3/uL — ABNORMAL LOW (ref 0.7–4.0)
MCH: 28.8 pg (ref 26.0–34.0)
MCHC: 32.1 g/dL (ref 30.0–36.0)
MCV: 89.8 fL (ref 80.0–100.0)
Monocytes Absolute: 0.3 10*3/uL (ref 0.1–1.0)
Monocytes Relative: 4 %
Neutro Abs: 7 10*3/uL (ref 1.7–7.7)
Neutrophils Relative %: 89 %
Platelet Count: 97 10*3/uL — ABNORMAL LOW (ref 150–400)
RBC: 3.23 MIL/uL — ABNORMAL LOW (ref 3.87–5.11)
RDW: 17.4 % — ABNORMAL HIGH (ref 11.5–15.5)
WBC Count: 7.8 10*3/uL (ref 4.0–10.5)
nRBC: 0.6 % — ABNORMAL HIGH (ref 0.0–0.2)

## 2023-10-31 MED ORDER — SODIUM CHLORIDE 0.9 % IV SOLN
Freq: Once | INTRAVENOUS | Status: AC
Start: 2023-10-31 — End: 2023-10-31

## 2023-10-31 MED ORDER — SODIUM CHLORIDE 0.9% FLUSH
10.0000 mL | Freq: Once | INTRAVENOUS | Status: AC
  Administered 2023-10-31: 10 mL

## 2023-10-31 MED ORDER — POTASSIUM CHLORIDE CRYS ER 20 MEQ PO TBCR: 20.0000 meq | EXTENDED_RELEASE_TABLET | Freq: Two times a day (BID) | ORAL | 0 refills | Status: DC

## 2023-10-31 MED ORDER — POTASSIUM CHLORIDE 10 MEQ/100ML IV SOLN
10.0000 meq | INTRAVENOUS | Status: AC
  Administered 2023-10-31 (×2): 10 meq via INTRAVENOUS
  Filled 2023-10-31: qty 100

## 2023-10-31 MED ORDER — SODIUM CHLORIDE 0.9% FLUSH: 10.0000 mL | Freq: Once | INTRAVENOUS | Status: DC

## 2023-10-31 MED ORDER — HEPARIN SOD (PORK) LOCK FLUSH 100 UNIT/ML IV SOLN
500.0000 [IU] | Freq: Once | INTRAVENOUS | Status: DC
Start: 2023-10-31 — End: 2023-10-31

## 2023-10-31 NOTE — Patient Instructions (Signed)
 Potassium Chloride Injection What is this medication? POTASSIUM CHLORIDE (poe TASS i um KLOOR ide) prevents and treats low levels of potassium in your body. Potassium plays an important role in maintaining the health of your kidneys, heart, muscles, and nervous system. This medicine may be used for other purposes; ask your health care provider or pharmacist if you have questions. COMMON BRAND NAME(S): PROAMP What should I tell my care team before I take this medication? They need to know if you have any of these conditions: Addison disease Dehydration Diabetes (high blood sugar) Heart disease High levels of potassium in the blood Irregular heartbeat or rhythm Kidney disease Large areas of burned skin An unusual or allergic reaction to potassium, other medications, foods, dyes, or preservatives Pregnant or trying to get pregnant Breast-feeding How should I use this medication? This medication is injected into a vein. It is given in a hospital or clinic setting. Talk to your care team about the use of this medication in children. Special care may be needed. Overdosage: If you think you have taken too much of this medicine contact a poison control center or emergency room at once. NOTE: This medicine is only for you. Do not share this medicine with others. What if I miss a dose? This does not apply. This medication is not for regular use. What may interact with this medication? Do not take this medication with any of the following: Certain diuretics, such as spironolactone, triamterene Eplerenone Sodium polystyrene sulfonate This medication may also interact with the following: Certain medications for blood pressure or heart disease, such as lisinopril, losartan, quinapril, valsartan Medications that lower your chance of fighting infection, such as cyclosporine, tacrolimus NSAIDs, medications for pain and inflammation, such as ibuprofen or naproxen Other potassium supplements Salt  substitutes This list may not describe all possible interactions. Give your health care provider a list of all the medicines, herbs, non-prescription drugs, or dietary supplements you use. Also tell them if you smoke, drink alcohol, or use illegal drugs. Some items may interact with your medicine. What should I watch for while using this medication? Visit your care team for regular checks on your progress. Tell your care team if your symptoms do not start to get better or if they get worse. You may need blood work while you are taking this medication. Avoid salt substitutes unless you are told otherwise by your care team. What side effects may I notice from receiving this medication? Side effects that you should report to your care team as soon as possible: Allergic reactions--skin rash, itching, hives, swelling of the face, lips, tongue, or throat High potassium level--muscle weakness, fast or irregular heartbeat Side effects that usually do not require medical attention (report to your care team if they continue or are bothersome): Diarrhea Nausea Stomach pain Vomiting This list may not describe all possible side effects. Call your doctor for medical advice about side effects. You may report side effects to FDA at 1-800-FDA-1088. Where should I keep my medication? This medication is given in a hospital or clinic. It will not be stored at home. NOTE: This sheet is a summary. It may not cover all possible information. If you have questions about this medicine, talk to your doctor, pharmacist, or health care provider.  2024 Elsevier/Gold Standard (2022-04-29 00:00:00)

## 2023-10-31 NOTE — Progress Notes (Signed)
 [name] Telephone:([phone]) [phone]   Fax:([phone]) [phone]  OFFICE PROGRESS NOTE  Tonita Fallow, MD [address] [place] [address]  DIAGNOSIS: Stage IVB ( T3, N2, M1c) non-Small Cell Lung Cancer, poorly differentiated squamous cell carcinoma presented with large right upper lobe lung mass in addition to large necrotic right hilar/mediastinal mass and innumerable hypermetabolic pulmonary metastatic lesions in addition to liver and bone metastasis as well as abdominal celiac axis lymphadenopathy diagnosed in October 2024.    PDL1: 0%   Molecular Studies: Positive for EGFR G719A   PRIOR THERAPY: Palliative radiotherapy to the large hilar and mediastinal mass under the care of Dr. Shannon last dose on 09/04/23   CURRENT THERAPY: Tagrisso  80 mg p.o. daily.  She will start concurrent systemic chemotherapy with carboplatin  for AUC of 5 and Alimta  500 Mg/M2 on November 14, 2023.  INTERVAL HISTORY: Rebecca Ochoa 62 y.o. female returns to the clinic today for follow-up visit accompanied by her boyfriend [name].Discussed the use of AI scribe software for clinical note transcription with the patient, who gave verbal consent to proceed.  History of Present Illness   The patient, a 62 year old diagnosed with stage 4 non-small cell lung cancer (NSCLC) with positive EGFR mutation G719A, presents for a follow-up consultation. The diagnosis was made in October 2024, and the patient has since received palliative radiation to the right hilar and mediastinal mass. She has been on Tagrisso  for the past month.  The patient was recently hospitalized due to weakness, shortness of breath, and fluid drainage from around the left lung. A CT scan of the chest during the hospital stay showed a reduction in tumor size and a decrease in the number of pulmonary nodules, indicating a positive response to the treatment.  Despite these encouraging results, the patient continues to  experience significant fatigue and weakness. She reports difficulty in performing basic physical activities, such as walking to the bathroom or lifting her legs into bed. However, she has noticed some improvement since her hospital discharge, with an ability to walk to the bathroom without the aid of a walker.  The patient also reports frequent diarrhea, occurring up to four to five times a day. She has been taking Imodium  to manage this symptom, but it has not been consistently effective.  The patient is also on a regimen of steroids, currently taking half a tablet of 2mg  decadron  once a day for seven days. She is also taking folic acid  in preparation for potential chemotherapy, as well as medications for acid reflux and sleep.  The patient's overall condition remains fragile, and she continues to experience significant symptoms related to her cancer and its treatment.       MEDICAL HISTORY: Past Medical History:  Diagnosis Date   Anemia    Asthma, mild intermittent, well-controlled    GERD (gastroesophageal reflux disease)    Hx of migraines    Hyperlipidemia    Hypertension    Vitamin D  deficiency     ALLERGIES:  is allergic to biaxin [clarithromycin].  MEDICATIONS:  Current Outpatient Medications  Medication Sig Dispense Refill   acetaminophen  (TYLENOL ) 500 MG tablet Take 1,000 mg by mouth every 8 (eight) hours as needed for mild pain.     albuterol  (PROVENTIL ) (2.5 MG/3ML) 0.083% nebulizer solution Take 3 mLs (2.5 mg total) by nebulization every 4 (four) hours as needed for wheezing or shortness of breath. 75 mL 12   ALPRAZolam  (XANAX ) 0.5 MG tablet Take 1 tablet (  0.5 mg total) by mouth 3 (three) times daily as needed for anxiety. 60 tablet 0   apixaban  (ELIQUIS ) 5 MG TABS tablet Take 1 tablet (5 mg total) by mouth 2 (two) times daily. 60 tablet 1   cyanocobalamin  (VITAMIN B12) 500 MCG tablet Take 1 tablet (500 mcg total) by mouth daily. 100 tablet 0   dexamethasone  (DECADRON ) 2  MG tablet Take 2 tablets (4 mg total) by mouth daily for 7 days, THEN 1 tablet (2 mg total) daily for 7 days.     feeding supplement (ENSURE ENLIVE / ENSURE PLUS) LIQD Take 237 mLs by mouth 2 (two) times daily between meals. 237 mL 12   folic acid  (FOLVITE ) 1 MG tablet Take 1 tablet (1 mg total) by mouth daily. 30 tablet 2   HYDROcodone -acetaminophen  (NORCO/VICODIN) 5-325 MG tablet Take 1 tablet by mouth every 6 (six) hours as needed. (Patient taking differently: Take 1 tablet by mouth every 6 (six) hours as needed (for pain).) 21 tablet 0   loperamide  (IMODIUM ) 2 MG capsule Take 1 capsule (2 mg total) by mouth as needed for diarrhea or loose stools. 30 capsule 0   ondansetron  (ZOFRAN ) 4 MG tablet Take 1 tablet (4 mg total) by mouth every 6 (six) hours as needed for nausea. (Patient not taking: Reported on 10/17/2023) 20 tablet 0   ondansetron  (ZOFRAN ) 8 MG tablet Take 8 mg by mouth every 8 (eight) hours as needed for nausea or vomiting.     pantoprazole  (PROTONIX ) 40 MG tablet Take 1 tablet (40 mg total) by mouth 2 (two) times daily. 30 tablet 2   prochlorperazine  (COMPAZINE ) 10 MG tablet Take 10 mg by mouth every 6 (six) hours as needed for nausea or vomiting.     saccharomyces boulardii (FLORASTOR) 250 MG capsule Take 1 capsule (250 mg total) by mouth 2 (two) times daily. 60 capsule 0   TAGRISSO  80 MG tablet Take 80 mg by mouth in the morning. Resume home med with Tagrisso  every day.     temazepam  (RESTORIL ) 30 MG capsule Take 1 capsule (30 mg total) by mouth at bedtime as needed for sleep. 30 capsule 0   No current facility-administered medications for this visit.    SURGICAL HISTORY:  Past Surgical History:  Procedure Laterality Date   BREAST SURGERY     reduction   BRONCHIAL BIOPSY  08/08/2023   Procedure: BRONCHIAL BIOPSIES;  Surgeon: Shelah Lamar RAMAN, MD;  Location: [facility] ENDOSCOPY;  Service: Pulmonary;;   BRONCHIAL BRUSHINGS  08/08/2023   Procedure: BRONCHIAL BRUSHINGS;  Surgeon: Shelah Lamar RAMAN, MD;  Location: [facility] ENDOSCOPY;  Service: Pulmonary;;   BRONCHIAL NEEDLE ASPIRATION BIOPSY  08/08/2023   Procedure: BRONCHIAL NEEDLE ASPIRATION BIOPSIES;  Surgeon: Shelah Lamar RAMAN, MD;  Location: [facility] ENDOSCOPY;  Service: Pulmonary;;   ENDOBRONCHIAL ULTRASOUND Bilateral 08/08/2023   Procedure: ENDOBRONCHIAL ULTRASOUND;  Surgeon: Shelah Lamar RAMAN, MD;  Location: [facility] ENDOSCOPY;  Service: Pulmonary;  Laterality: Bilateral;   HEMOSTASIS CONTROL  08/08/2023   Procedure: HEMOSTASIS CONTROL;  Surgeon: Shelah Lamar RAMAN, MD;  Location: [facility] ENDOSCOPY;  Service: Pulmonary;;   IR IMAGING GUIDED PORT INSERTION  09/01/2023   REDUCTION MAMMAPLASTY     TUBAL LIGATION      REVIEW OF SYSTEMS:  Constitutional: positive for fatigue Eyes: negative Ears, nose, mouth, throat, and face: negative Respiratory: positive for dyspnea on exertion Cardiovascular: negative Gastrointestinal: positive for diarrhea Genitourinary:negative Integument/breast: negative Hematologic/lymphatic: negative Musculoskeletal:positive for muscle weakness Neurological: negative Behavioral/Psych: negative Endocrine: negative Allergic/Immunologic: negative  PHYSICAL EXAMINATION: General appearance: alert, cooperative, fatigued, and no distress Head: Normocephalic, without obvious abnormality, atraumatic Neck: no adenopathy, no JVD, supple, symmetrical, trachea midline, and thyroid  not enlarged, symmetric, no tenderness/mass/nodules Lymph nodes: Cervical, supraclavicular, and axillary nodes normal. Resp: clear to auscultation bilaterally Back: symmetric, no curvature. ROM normal. No CVA tenderness. Cardio: regular rate and rhythm, S1, S2 normal, no murmur, click, rub or gallop GI: soft, non-tender; bowel sounds normal; no masses,  no organomegaly Extremities: extremities normal, atraumatic, no cyanosis or edema Neurologic: Alert and oriented X 3, normal strength and tone. Normal symmetric reflexes. Normal coordination and gait  ECOG  PERFORMANCE STATUS: 1 - Symptomatic but completely ambulatory  Blood pressure 111/74, pulse (!) 118, temperature (!) 97.5 F (36.4 C), temperature source Temporal, resp. rate 17, weight 147 lb 1.6 oz (66.7 kg), SpO2 96%.  LABORATORY DATA: Lab Results  Component Value Date   WBC 7.8 10/31/2023   HGB 9.3 (L) 10/31/2023   HCT 29.0 (L) 10/31/2023   MCV 89.8 10/31/2023   PLT 97 (L) 10/31/2023      Chemistry      Component Value Date/Time   NA 139 10/23/2023 0500   K 3.6 10/23/2023 0500   CL 104 10/23/2023 0500   CO2 27 10/23/2023 0500   BUN 23 10/23/2023 0500   CREATININE 0.63 10/23/2023 0500   CREATININE 0.68 10/05/2023 0900   CREATININE 1.05 10/12/2022 1442      Component Value Date/Time   CALCIUM 9.1 10/23/2023 0500   ALKPHOS 97 10/19/2023 0712   AST 26 10/19/2023 0712   AST 69 (H) 10/05/2023 0900   ALT 61 (H) 10/19/2023 0712   ALT 138 (H) 10/05/2023 0900   BILITOT 1.1 10/19/2023 0712   BILITOT 0.7 10/05/2023 0900       RADIOGRAPHIC STUDIES: DG Chest 2 View Result Date: 10/20/2023 CLINICAL DATA:  Dyspnea.  Known pulmonary masses. EXAM: CHEST - 2 VIEW COMPARISON:  X-ray 10/19/2023. older exams as well FINDINGS: Tiny left effusion. Underinflation. No pneumothorax or edema. Normal cardiopericardial silhouette. Masslike opacity seen in both lungs. Please correlate for known history. Left subclavian chest port with the tip overlying the SVC right atrial junction. Overlapping cardiac leads. Film is under penetrated. IMPRESSION: Underinflation.  Multiple pulmonary masses.  Tiny left effusion. Electronically Signed   By: Ranell Bring M.D.   On: 10/20/2023 12:05   VAS US  LOWER EXTREMITY VENOUS (DVT) Result Date: 10/19/2023  Lower Venous DVT Study Patient Name:  REGENA DELUCCHI  Date of Exam:   10/18/2023 Medical Rec #: [id number]     Accession #:    [id number] Date of Birth: 1961-05-14      Patient Gender: F Patient Age:   41 years Exam Location:  [facility] Procedure:       VAS US  LOWER EXTREMITY VENOUS (DVT) Referring Phys: MAUDE BANNER --------------------------------------------------------------------------------  Indications: Follow up exam.  Risk Factors: Metastatic lung cancer / chemotherapy. Anticoagulation: Eliquis  (outpatient) & heparin  (inpatient). Comparison Study: Previous exam on 09/05/2023 was positive for DVT of LLE (popv,                   ptv, and perov) Performing Technologist: Jody Hill RVT, RDMS  Examination Guidelines: A complete evaluation includes B-mode imaging, spectral Doppler, color Doppler, and power Doppler as needed of all accessible portions of each vessel. Bilateral testing is considered an integral part of a complete examination. Limited examinations for reoccurring indications may be performed as noted. The reflux portion of the exam is  performed with the patient in reverse Trendelenburg.  +---------+---------------+---------+-----------+----------+--------------+ RIGHT    CompressibilityPhasicitySpontaneityPropertiesThrombus Aging +---------+---------------+---------+-----------+----------+--------------+ CFV      Full           Yes      Yes                                 +---------+---------------+---------+-----------+----------+--------------+ SFJ      Full                                                        +---------+---------------+---------+-----------+----------+--------------+ FV Prox  Full           Yes      Yes                                 +---------+---------------+---------+-----------+----------+--------------+ FV Mid   Full           Yes      Yes                                 +---------+---------------+---------+-----------+----------+--------------+ FV DistalFull           Yes      Yes                                 +---------+---------------+---------+-----------+----------+--------------+ PFV      Full                                                         +---------+---------------+---------+-----------+----------+--------------+ POP      Full           Yes      Yes                                 +---------+---------------+---------+-----------+----------+--------------+ PTV      Full                                                        +---------+---------------+---------+-----------+----------+--------------+ PERO     Full                                                        +---------+---------------+---------+-----------+----------+--------------+   +---------+---------------+---------+-----------+----------+--------------+ LEFT     CompressibilityPhasicitySpontaneityPropertiesThrombus Aging +---------+---------------+---------+-----------+----------+--------------+ CFV      Full           Yes      Yes                                 +---------+---------------+---------+-----------+----------+--------------+  SFJ      Full                                                        +---------+---------------+---------+-----------+----------+--------------+ FV Prox  Full           Yes      Yes                                 +---------+---------------+---------+-----------+----------+--------------+ FV Mid   Full           Yes      Yes                                 +---------+---------------+---------+-----------+----------+--------------+ FV DistalFull           Yes      Yes                                 +---------+---------------+---------+-----------+----------+--------------+ PFV      Full                                                        +---------+---------------+---------+-----------+----------+--------------+ POP      Full           Yes      Yes                                 +---------+---------------+---------+-----------+----------+--------------+ PTV      Full                                                         +---------+---------------+---------+-----------+----------+--------------+ PERO     Full                                                        +---------+---------------+---------+-----------+----------+--------------+     Summary: BILATERAL: - No evidence of deep vein thrombosis seen in the lower extremities, bilaterally. -No evidence of popliteal cyst, bilaterally.  LEFT: - Findings suggest resolution of previously noted thrombus.  *See table(s) above for measurements and observations. Electronically signed by Debby Robertson on 10/19/2023 at 10:41:03 AM.    Final    DG Chest Port 1 View Result Date: 10/19/2023 CLINICAL DATA:  Pleural effusion. EXAM: PORTABLE CHEST 1 VIEW COMPARISON:  10/18/2023 FINDINGS: The cardio pericardial silhouette is enlarged. Cardiopericardial silhouette is at upper limits of normal for size. Left-sided Port-A-Cath again noted. Asymmetric elevation right hemidiaphragm with persistent left base atelectasis or infiltrate. Bilateral pulmonary nodules again noted, most dominant in the right upper lung. Bones are diffusely demineralized. IMPRESSION:  1. No substantial change given differential positioning. Persistent left base atelectasis or infiltrate with effusion. 2. Bilateral pulmonary nodules again noted, most dominant in the right upper lung. Electronically Signed   By: Camellia Candle M.D.   On: 10/19/2023 08:44   DG Chest 1 View Result Date: 10/18/2023 CLINICAL DATA:  Hemothorax, acute respiratory failure with hypoxia. EXAM: CHEST  1 VIEW COMPARISON:  10/18/2023. FINDINGS: The heart size and mediastinal contours are stable. There is atherosclerotic calcification of the aorta. A left chest port is stable in position. Scattered opacities are noted in the lungs bilaterally and at the right hilum, similar in appearance to the prior exam. There is a small left pleural effusion. No pneumothorax is seen. Bony structures appear stable. IMPRESSION: 1. Small left pleural effusion. 2.  Remaining findings are unchanged. Electronically Signed   By: Leita Birmingham M.D.   On: 10/18/2023 21:54   DG Chest Port 1 View Result Date: 10/18/2023 CLINICAL DATA:  Post thoracentesis. EXAM: PORTABLE CHEST 1 VIEW COMPARISON:  CT 10/17/2023.  Radiographs 10/17/2023 and 09/18/2023. FINDINGS: 1233 hours. Left IJ Port-A-Cath extends to the level of the upper right atrium. The heart size and mediastinal contours are stable. Right hilar mass and multiple pulmonary nodules in both lungs are grossly stable. Left pleural effusion may be slightly smaller. There is persistent asymmetric left lower lobe airspace disease. No evidence of pneumothorax. The bones appear unchanged. IMPRESSION: 1. No evidence of pneumothorax following thoracentesis. Left pleural effusion may be slightly smaller. 2. Stable right hilar mass and multiple pulmonary nodules consistent with known metastatic disease. Electronically Signed   By: Elsie Perone M.D.   On: 10/18/2023 14:10   CT Angio Chest PE W and/or Wo Contrast Result Date: 10/17/2023 CLINICAL DATA:  Pulmonary embolism (PE) suspected, high prob. Shortness of breath and chest pain. * Tracking Code: BO * EXAM: CT ANGIOGRAPHY CHEST WITH CONTRAST TECHNIQUE: Multidetector CT imaging of the chest was performed using the standard protocol during bolus administration of intravenous contrast. Multiplanar CT image reconstructions and MIPs were obtained to evaluate the vascular anatomy. RADIATION DOSE REDUCTION: This exam was performed according to the departmental dose-optimization program which includes automated exposure control, adjustment of the mA and/or kV according to patient size and/or use of iterative reconstruction technique. CONTRAST:  75mL OMNIPAQUE  IOHEXOL  350 MG/ML SOLN COMPARISON:  CT angiography chest from 09/12/2023. FINDINGS: Cardiovascular: Evaluation of pulmonary embolism beyond the segmental branches is limited due to patient's respiratory motion. There is no embolism  to the segmental pulmonary artery level. Normal cardiac size. No pericardial effusion. No aortic aneurysm. There are coronary artery calcifications, in keeping with coronary artery disease. There are also mild peripheral atherosclerotic vascular calcifications of thoracic aorta and its major branches. Mediastinum/Nodes: There is a 0.8 x 1.1 cm hypoattenuating nodule in the inferior right thyroid  lobe, which is incompletely characterized on the current examination but appears unchanged since the prior study. Redemonstration of multiple enlarged hilar and mediastinal lymph nodes, compatible with metastases. There is mild interval decrease in the dominant right hilar nodal mass which currently measures 4.4 x 5.6 cm, previously 5.2 x 7.0 cm. No axillary lymphadenopathy by size criteria. The esophagus is nondistended precluding optimal assessment. Lungs/Pleura: There is linear filling defect along the lower portion of the lower trachea extending into the right main bronchus, likely mucus/secretion. The central tracheo-bronchial tree is otherwise patent. Since the prior study, there is new small-to-moderate left pleural effusion with associated compressive atelectatic changes. Redemonstration of multiple bilateral lobulated pulmonary masses  with largest in the right lung apex measuring 3.5 x 4.6 cm, which previously measured 3.9 x 5.1 cm, when remeasured in similar fashion. The nodule exhibits new central cavitation/Re-aeration of lung parenchyma. No right pleural effusion. No pneumothorax on either side. Many nodules exhibit new surrounding ground-glass changes. Upper Abdomen: There are persistent ill-defined, hypoattenuating masses in the liver with largest in the left hepatic lobe, segment 2 measuring 5.0 x 8.1 cm. These are incompletely characterized on the current examination but highly concerning for metastases. There are ill-defined hypoattenuating lesions in the spleen as well, also favored to represent  metastases. Remaining visualized upper abdominal viscera within normal limits. Musculoskeletal: A CT Port-a-Cath is seen in the left upper chest wall with the catheter terminating in the cavo-atrial junction region. Visualized soft tissues of the chest wall are otherwise grossly unremarkable. No suspicious osseous lesions. There are mild multilevel degenerative changes in the visualized spine. Review of the MIP images confirms the above findings. IMPRESSION: 1. No embolism to the segmental pulmonary artery level. 2. Interval decrease in the multiple bilateral lung nodules as well as hilar/mediastinal lymphadenopathy. Also there are new ground-glass changes surrounding multiple lung nodules, which may also represent posttreatment changes. Please see above for details. 3. There is new small-to-moderate left pleural effusion with associated compressive atelectatic changes. No right pleural effusion. 4. There are ill-defined hypoattenuating masses in the liver and spleen, highly concerning for metastases. Further imaging with dedicated contrast-enhanced MRI abdomen as per liver mass protocol is recommended. 5. Multiple other nonacute observations, as described above. Aortic Atherosclerosis (ICD10-I70.0). Electronically Signed   By: Ree Molt M.D.   On: 10/17/2023 13:41   DG Chest Port 1 View Result Date: 10/17/2023 CLINICAL DATA:  Dyspnea.  Shortness of breath and chest pain. EXAM: PORTABLE CHEST 1 VIEW COMPARISON:  09/18/2023. FINDINGS: Multiple lobular opacities are again seen throughout bilateral lungs, right more than left, compatible with patient's known history of metastases. There is interval decrease in the size of the lesions when compared to the prior radiograph from 09/18/2023. However, since the prior study, there is new left retrocardiac airspace opacity obscuring the left hemidiaphragm, descending thoracic aorta and blunting the left lateral costophrenic angle suggesting combination of left lower  lobe atelectasis and/or consolidation with pleural effusion. Right lateral costophrenic angle is clear. Stable cardio-mediastinal silhouette. No acute osseous abnormalities. The soft tissues are within normal limits. Redemonstration of left-sided CT Port-A-Cath with its tip overlying the cavoatrial junction region, unchanged. IMPRESSION: *New left retrocardiac opacity, as described above. *Interval decrease in the multiple lobular opacities throughout bilateral lungs, suggesting interval improvement in the metastases. Electronically Signed   By: Ree Molt M.D.   On: 10/17/2023 11:57    ASSESSMENT AND PLAN: This is a very pleasant 62 years old white female with Stage IVB ( T3, N2, M1c) non-Small Cell Lung Cancer, poorly differentiated squamous cell carcinoma presented with large right upper lobe lung mass in addition to large necrotic right hilar/mediastinal mass and innumerable hypermetabolic pulmonary metastatic lesions in addition to liver and bone metastasis as well as abdominal celiac axis lymphadenopathy diagnosed in October 2024.  Molecular studies showed negative PD-L1 expression and positive EGFR mutation G719A She underwent palliative radiotherapy to the large hilar and mediastinal mass under the care of Dr. Shannon last dose on 09/04/23 She is currently on treatment with Tagrisso  80 mg p.o. daily started on September 29, 2023.  The patient has been tolerating this treatment fairly well except for the few episodes of diarrhea. She  was supposed to start concurrent chemotherapy with carboplatin  for AUC of 5 and Alimta  500 Mg/M2 with Tagrisso  but this will be delayed by 2 more weeks until improvement of her general condition.    Stage IV Non-Small Cell Lung Cancer (NSCLC) with EGFR Mutation G719A Diagnosed in October 2024. Currently on Tagrisso  for one month. Recent CT scan shows tumor shrinkage and reduction in pulmonary nodules. Experiencing significant fatigue and weakness, likely due to cancer  and treatment. Diarrhea managed with Imodium . - Continue Tagrisso  - Delay chemotherapy infusion for two weeks to allow strength recovery - Follow-up in two weeks to reassess strength and readiness for chemotherapy - Continue Imodium , up to 8 tablets per day if needed - Consider Lomotil if diarrhea persists - Continue current medications including steroids (2 mg once daily for seven days), folic acid , and Brotonics  Fatigue and Weakness Significant fatigue and weakness, likely multifactorial due to advanced cancer and recent hospitalization. Some improvement with physical therapy but remains very weak. Importance of physical therapy and gradual increase in activity discussed. - Encourage continued physical therapy as tolerated - Monitor for improvement in strength and mobility  Diarrhea Frequent diarrhea, managed with Imodium . Some improvement with increased dosage. Potential need for Lomotil discussed if symptoms persist. - Continue Imodium , up to 8 tablets per day if needed - Consider Lomotil if diarrhea persists  General Health Maintenance Emphasized maintaining strength and overall health to better tolerate cancer treatments. Discussed balanced diet and hydration. - Encourage balanced diet and hydration - Monitor for any new symptoms or side effects from medications  Follow-up - Follow-up appointment in two weeks.  For the hypokalemia, we will arrange for the patient to receive 40 mEq of potassium chloride  in the clinic IV today. The patient was advised to call immediately if she has any other concerning symptoms in the interval.  The patient voices understanding of current disease status and treatment options and is in agreement with the current care plan.  All questions were answered. The patient knows to call the clinic with any problems, questions or concerns. We can certainly see the patient much sooner if necessary. The total time spent in the appointment was 30  minutes.  Disclaimer: This note was dictated with voice recognition software. Similar sounding words can inadvertently be transcribed and may not be corrected upon review.

## 2023-10-31 NOTE — Telephone Encounter (Signed)
 CRITICAL VALUE STICKER  CRITICAL VALUE:Potassium 2.5  RECEIVER (on-site recipient of call):Sandi K, RN  DATE & TIME NOTIFIED: 10/31/23; 1400  MESSENGER (representative from lab):Elizabeth  MD NOTIFIED: Dr. Zell NOVAK, RN  TIME OF NOTIFICATION:1404  RESPONSE: Information acknowledged

## 2023-11-07 ENCOUNTER — Other Ambulatory Visit: Payer: No Typology Code available for payment source

## 2023-11-09 ENCOUNTER — Inpatient Hospital Stay: Payer: No Typology Code available for payment source | Admitting: Nurse Practitioner

## 2023-11-09 ENCOUNTER — Inpatient Hospital Stay (HOSPITAL_COMMUNITY)
Admission: EM | Admit: 2023-11-09 | Discharge: 2023-11-10 | DRG: 641 | Disposition: A | Discharge status: Home / Self Care | Payer: No Typology Code available for payment source | Attending: Internal Medicine | Admitting: Internal Medicine

## 2023-11-09 ENCOUNTER — Other Ambulatory Visit: Payer: Self-pay

## 2023-11-09 ENCOUNTER — Emergency Department (HOSPITAL_COMMUNITY): Payer: No Typology Code available for payment source

## 2023-11-09 ENCOUNTER — Ambulatory Visit (INDEPENDENT_AMBULATORY_CARE_PROVIDER_SITE_OTHER): Payer: No Typology Code available for payment source | Admitting: Adult Health

## 2023-11-09 ENCOUNTER — Encounter (HOSPITAL_COMMUNITY): Payer: Self-pay

## 2023-11-09 ENCOUNTER — Ambulatory Visit (INDEPENDENT_AMBULATORY_CARE_PROVIDER_SITE_OTHER): Payer: No Typology Code available for payment source

## 2023-11-09 ENCOUNTER — Encounter: Payer: Self-pay | Admitting: Adult Health

## 2023-11-09 VITALS — BP 74/50 | HR 133 | Temp 98.8°F | Ht 65.0 in | Wt 150.0 lb

## 2023-11-09 DIAGNOSIS — D630 Anemia in neoplastic disease: Secondary | ICD-10-CM | POA: Diagnosis present

## 2023-11-09 DIAGNOSIS — R531 Weakness: Secondary | ICD-10-CM

## 2023-11-09 DIAGNOSIS — Z833 Family history of diabetes mellitus: Secondary | ICD-10-CM | POA: Diagnosis not present

## 2023-11-09 DIAGNOSIS — C3490 Malignant neoplasm of unspecified part of unspecified bronchus or lung: Secondary | ICD-10-CM | POA: Diagnosis present

## 2023-11-09 DIAGNOSIS — C787 Secondary malignant neoplasm of liver and intrahepatic bile duct: Secondary | ICD-10-CM | POA: Diagnosis present

## 2023-11-09 DIAGNOSIS — T451X5A Adverse effect of antineoplastic and immunosuppressive drugs, initial encounter: Secondary | ICD-10-CM | POA: Diagnosis present

## 2023-11-09 DIAGNOSIS — Z841 Family history of disorders of kidney and ureter: Secondary | ICD-10-CM | POA: Diagnosis not present

## 2023-11-09 DIAGNOSIS — R000 Tachycardia, unspecified: Secondary | ICD-10-CM

## 2023-11-09 DIAGNOSIS — I959 Hypotension, unspecified: Secondary | ICD-10-CM | POA: Insufficient documentation

## 2023-11-09 DIAGNOSIS — Z87891 Personal history of nicotine dependence: Secondary | ICD-10-CM | POA: Diagnosis not present

## 2023-11-09 DIAGNOSIS — J189 Pneumonia, unspecified organism: Secondary | ICD-10-CM

## 2023-11-09 DIAGNOSIS — R54 Age-related physical debility: Secondary | ICD-10-CM | POA: Diagnosis present

## 2023-11-09 DIAGNOSIS — Z881 Allergy status to other antibiotic agents status: Secondary | ICD-10-CM

## 2023-11-09 DIAGNOSIS — J4520 Mild intermittent asthma, uncomplicated: Secondary | ICD-10-CM | POA: Diagnosis present

## 2023-11-09 DIAGNOSIS — E860 Dehydration: Principal | ICD-10-CM | POA: Diagnosis present

## 2023-11-09 DIAGNOSIS — E785 Hyperlipidemia, unspecified: Secondary | ICD-10-CM | POA: Diagnosis present

## 2023-11-09 DIAGNOSIS — R627 Adult failure to thrive: Secondary | ICD-10-CM | POA: Diagnosis present

## 2023-11-09 DIAGNOSIS — Z86711 Personal history of pulmonary embolism: Secondary | ICD-10-CM | POA: Diagnosis not present

## 2023-11-09 DIAGNOSIS — F17210 Nicotine dependence, cigarettes, uncomplicated: Secondary | ICD-10-CM | POA: Diagnosis not present

## 2023-11-09 DIAGNOSIS — F419 Anxiety disorder, unspecified: Secondary | ICD-10-CM | POA: Diagnosis present

## 2023-11-09 DIAGNOSIS — Z7901 Long term (current) use of anticoagulants: Secondary | ICD-10-CM

## 2023-11-09 DIAGNOSIS — E861 Hypovolemia: Secondary | ICD-10-CM | POA: Diagnosis present

## 2023-11-09 DIAGNOSIS — Z8249 Family history of ischemic heart disease and other diseases of the circulatory system: Secondary | ICD-10-CM | POA: Diagnosis not present

## 2023-11-09 DIAGNOSIS — C7800 Secondary malignant neoplasm of unspecified lung: Secondary | ICD-10-CM

## 2023-11-09 DIAGNOSIS — K219 Gastro-esophageal reflux disease without esophagitis: Secondary | ICD-10-CM | POA: Diagnosis present

## 2023-11-09 DIAGNOSIS — K529 Noninfective gastroenteritis and colitis, unspecified: Secondary | ICD-10-CM | POA: Diagnosis present

## 2023-11-09 DIAGNOSIS — E559 Vitamin D deficiency, unspecified: Secondary | ICD-10-CM | POA: Diagnosis present

## 2023-11-09 DIAGNOSIS — E876 Hypokalemia: Principal | ICD-10-CM | POA: Diagnosis present

## 2023-11-09 DIAGNOSIS — I10 Essential (primary) hypertension: Secondary | ICD-10-CM | POA: Diagnosis present

## 2023-11-09 DIAGNOSIS — D638 Anemia in other chronic diseases classified elsewhere: Secondary | ICD-10-CM | POA: Diagnosis not present

## 2023-11-09 DIAGNOSIS — C3411 Malignant neoplasm of upper lobe, right bronchus or lung: Secondary | ICD-10-CM | POA: Diagnosis present

## 2023-11-09 DIAGNOSIS — R109 Unspecified abdominal pain: Secondary | ICD-10-CM | POA: Diagnosis present

## 2023-11-09 LAB — CBC WITH DIFFERENTIAL/PLATELET
Abs Immature Granulocytes: 0.04 10*3/uL (ref 0.00–0.07)
Basophils Absolute: 0 10*3/uL (ref 0.0–0.1)
Basophils Relative: 0 %
Eosinophils Absolute: 0 10*3/uL (ref 0.0–0.5)
Eosinophils Relative: 0 %
HCT: 28.3 % — ABNORMAL LOW (ref 36.0–46.0)
Hemoglobin: 8.5 g/dL — ABNORMAL LOW (ref 12.0–15.0)
Immature Granulocytes: 1 %
Lymphocytes Relative: 7 %
Lymphs Abs: 0.4 10*3/uL — ABNORMAL LOW (ref 0.7–4.0)
MCH: 27.6 pg (ref 26.0–34.0)
MCHC: 30 g/dL (ref 30.0–36.0)
MCV: 91.9 fL (ref 80.0–100.0)
Monocytes Absolute: 0.4 10*3/uL (ref 0.1–1.0)
Monocytes Relative: 8 %
Neutro Abs: 4.6 10*3/uL (ref 1.7–7.7)
Neutrophils Relative %: 84 %
Platelets: 160 10*3/uL (ref 150–400)
RBC: 3.08 MIL/uL — ABNORMAL LOW (ref 3.87–5.11)
RDW: 17.6 % — ABNORMAL HIGH (ref 11.5–15.5)
WBC: 5.5 10*3/uL (ref 4.0–10.5)
nRBC: 0 % (ref 0.0–0.2)

## 2023-11-09 LAB — COMPREHENSIVE METABOLIC PANEL
ALT: 19 U/L (ref 0–44)
AST: 22 U/L (ref 15–41)
Albumin: 2.5 g/dL — ABNORMAL LOW (ref 3.5–5.0)
Alkaline Phosphatase: 99 U/L (ref 38–126)
Anion gap: 9 (ref 5–15)
BUN: 11 mg/dL (ref 8–23)
CO2: 24 mmol/L (ref 22–32)
Calcium: 8.7 mg/dL — ABNORMAL LOW (ref 8.9–10.3)
Chloride: 100 mmol/L (ref 98–111)
Creatinine, Ser: 0.5 mg/dL (ref 0.44–1.00)
GFR, Estimated: >60 mL/min (ref >60)
Glucose, Bld: 98 mg/dL (ref 70–99)
Potassium: 2.9 mmol/L — ABNORMAL LOW (ref 3.5–5.1)
Sodium: 133 mmol/L — ABNORMAL LOW (ref 135–145)
Total Bilirubin: 1.6 mg/dL — ABNORMAL HIGH (ref 0.0–1.2)
Total Protein: 6.1 g/dL — ABNORMAL LOW (ref 6.5–8.1)

## 2023-11-09 LAB — TSH: TSH: 0.213 u[IU]/mL — ABNORMAL LOW (ref 0.350–4.500)

## 2023-11-09 LAB — I-STAT CHEM 8, ED
BUN: 8 mg/dL (ref 8–23)
Calcium, Ion: 1.19 mmol/L (ref 1.15–1.40)
Chloride: 100 mmol/L (ref 98–111)
Creatinine, Ser: 0.5 mg/dL (ref 0.44–1.00)
Glucose, Bld: 95 mg/dL (ref 70–99)
HCT: 24 % — ABNORMAL LOW (ref 36.0–46.0)
Hemoglobin: 8.2 g/dL — ABNORMAL LOW (ref 12.0–15.0)
Potassium: 3.1 mmol/L — ABNORMAL LOW (ref 3.5–5.1)
Sodium: 135 mmol/L (ref 135–145)
TCO2: 24 mmol/L (ref 22–32)

## 2023-11-09 LAB — I-STAT CG4 LACTIC ACID, ED: Lactic Acid, Venous: 0.7 mmol/L (ref 0.5–1.9)

## 2023-11-09 LAB — MAGNESIUM: Magnesium: 1.7 mg/dL (ref 1.7–2.4)

## 2023-11-09 LAB — TROPONIN I (HIGH SENSITIVITY)
Troponin I (High Sensitivity): 4 ng/L (ref <18)
Troponin I (High Sensitivity): 5 ng/L (ref <18)

## 2023-11-09 LAB — LIPASE, BLOOD: Lipase: 34 U/L (ref 11–51)

## 2023-11-09 MED ORDER — ALPRAZOLAM 0.5 MG PO TABS
0.5000 mg | ORAL_TABLET | Freq: Three times a day (TID) | ORAL | Status: DC | PRN
  Administered 2023-11-10: 0.5 mg via ORAL
  Filled 2023-11-09: qty 1

## 2023-11-09 MED ORDER — POTASSIUM CHLORIDE 10 MEQ/100ML IV SOLN
10.0000 meq | INTRAVENOUS | Status: AC
  Administered 2023-11-10 (×2): 10 meq via INTRAVENOUS
  Filled 2023-11-09 (×2): qty 100

## 2023-11-09 MED ORDER — FOLIC ACID 1 MG PO TABS
1.0000 mg | ORAL_TABLET | Freq: Every day | ORAL | Status: DC
  Administered 2023-11-10: 1 mg via ORAL
  Filled 2023-11-09: qty 1

## 2023-11-09 MED ORDER — IOHEXOL 350 MG/ML SOLN
100.0000 mL | Freq: Once | INTRAVENOUS | Status: AC | PRN
  Administered 2023-11-09: 100 mL via INTRAVENOUS

## 2023-11-09 MED ORDER — PANTOPRAZOLE SODIUM 40 MG PO TBEC
40.0000 mg | DELAYED_RELEASE_TABLET | Freq: Two times a day (BID) | ORAL | Status: DC
  Administered 2023-11-10 (×2): 40 mg via ORAL
  Filled 2023-11-09 (×2): qty 1

## 2023-11-09 MED ORDER — APIXABAN 5 MG PO TABS: 5.0000 mg | ORAL_TABLET | Freq: Two times a day (BID) | ORAL | Status: DC

## 2023-11-09 MED ORDER — CYANOCOBALAMIN 500 MCG PO TABS
500.0000 ug | ORAL_TABLET | Freq: Every day | ORAL | Status: DC
  Administered 2023-11-10: 500 ug via ORAL
  Filled 2023-11-09: qty 1

## 2023-11-09 MED ORDER — POTASSIUM CHLORIDE 10 MEQ/100ML IV SOLN
10.0000 meq | INTRAVENOUS | Status: AC
  Administered 2023-11-09 (×3): 10 meq via INTRAVENOUS
  Filled 2023-11-09 (×3): qty 100

## 2023-11-09 MED ORDER — SODIUM CHLORIDE 0.9 % IV BOLUS
1000.0000 mL | Freq: Once | INTRAVENOUS | Status: AC
Start: 2023-11-09 — End: 2023-11-09
  Administered 2023-11-09: 1000 mL via INTRAVENOUS

## 2023-11-09 MED ORDER — OSIMERTINIB MESYLATE 80 MG PO TABS: 80.0000 mg | ORAL_TABLET | Freq: Every morning | ORAL | Status: DC

## 2023-11-09 MED ORDER — LOPERAMIDE HCL 2 MG PO CAPS: 2.0000 mg | ORAL_CAPSULE | ORAL | Status: DC | PRN

## 2023-11-09 MED ORDER — TEMAZEPAM 15 MG PO CAPS
30.0000 mg | ORAL_CAPSULE | Freq: Every evening | ORAL | Status: DC | PRN
  Administered 2023-11-10: 30 mg via ORAL
  Filled 2023-11-09: qty 2

## 2023-11-09 NOTE — ED Provider Notes (Signed)
 [place] EMERGENCY DEPARTMENT AT [facility] Provider Note   CSN: [id number] Arrival date & time: 11/09/23  1709     History  Chief Complaint  Patient presents with   Tachycardia   Weakness   Hypotension    Raphaella Larkin is a 63 y.o. female.  63 year old female with history of stage IV metastatic lung cancer and history of PE on Eliquis  presenting from pulmonology office via EMS due to hypotension and tachycardia in the setting of 2 days of progressive weakness/malaise.  She was at her pulmonology office for routine follow-up today when she was noted to have blood pressure in the 70s/50s with HR in the 130s.   Patient states on Tuesday 1/7 she started feeling weak and fatigued and also had upper abdominal pain without any nausea or vomiting.  Denies any recent illness, fevers/cough/cold.  Denies any lightheadedness, dizziness, headaches, LOC/syncope, chest pain, palpitations.  Does endorse chronic shortness of breath but denies acute worsening of this.  Does not use home oxygen or inhalers.  She is on Tagrisso  for lung cancer.  Also states that she has not been eating or drinking much over the past few days due to feeling weak, states she only gets out of bed to use the bathroom.  States she was admitted to the hospital in December, during which time she required blood transfusion as well as Lasix .  Denies any cardiac history.  Does not use any Lasix  at home.  Endorses adherence to Eliquis   With EMS, patient did not receive any interventions but her blood pressure improved to 100s/70s   Weakness Associated symptoms: abdominal pain   Associated symptoms: no chest pain, no cough, no dizziness, no dysuria, no fever, no headaches, no nausea and no vomiting        Home Medications Prior to Admission medications   Medication Sig Start Date End Date Taking? Authorizing Provider  acetaminophen  (TYLENOL ) 500 MG tablet Take 1,000 mg by mouth every 8 (eight) hours as needed  for mild pain.    [provider]  albuterol  (PROVENTIL ) (2.5 MG/3ML) 0.083% nebulizer solution Take 3 mLs (2.5 mg total) by nebulization every 4 (four) hours as needed for wheezing or shortness of breath. Patient not taking: Reported on 11/09/2023 10/23/23   Pokhrel, Laxman, MD  ALPRAZolam  (XANAX ) 0.5 MG tablet Take 1 tablet (0.5 mg total) by mouth 3 (three) times daily as needed for anxiety. 10/11/23   Wilkinson, Dana E, NP  apixaban  (ELIQUIS ) 5 MG TABS tablet Take 1 tablet (5 mg total) by mouth 2 (two) times daily. 09/25/23   Barbarann Nest, MD  cyanocobalamin  (VITAMIN B12) 500 MCG tablet Take 1 tablet (500 mcg total) by mouth daily. 10/23/23 01/31/24  Pokhrel, Laxman, MD  folic acid  (FOLVITE ) 1 MG tablet Take 1 tablet (1 mg total) by mouth daily. 09/12/23   Heilingoetter, Cassandra L, PA-C  HYDROcodone -acetaminophen  (NORCO/VICODIN) 5-325 MG tablet Take 1 tablet by mouth every 6 (six) hours as needed. Patient taking differently: Take 1 tablet by mouth every 6 (six) hours as needed (for pain). 09/07/23   Samtani, Jai-Gurmukh, MD  loperamide  (IMODIUM ) 2 MG capsule Take 1 capsule (2 mg total) by mouth as needed for diarrhea or loose stools. 10/23/23   Pokhrel, Vernal, MD  ondansetron  (ZOFRAN ) 4 MG tablet Take 1 tablet (4 mg total) by mouth every 6 (six) hours as needed for nausea. Patient not taking: Reported on 11/09/2023 08/17/23   Tobie Yetta HERO, MD  ondansetron  (ZOFRAN ) 8 MG tablet Take 8  mg by mouth every 8 (eight) hours as needed for nausea or vomiting. Patient not taking: Reported on 11/09/2023    [provider]  pantoprazole  (PROTONIX ) 40 MG tablet Take 1 tablet (40 mg total) by mouth 2 (two) times daily. 09/07/23   Samtani, Jai-Gurmukh, MD  prochlorperazine  (COMPAZINE ) 10 MG tablet Take 10 mg by mouth every 6 (six) hours as needed for nausea or vomiting. Patient not taking: Reported on 11/09/2023    [provider]  TAGRISSO  80 MG tablet Take 80 mg by mouth in the morning.  Resume home med with Tagrisso  every day.    [provider]  temazepam  (RESTORIL ) 30 MG capsule Take 1 capsule (30 mg total) by mouth at bedtime as needed for sleep. 10/27/23   Pickenpack-Cousar, Athena N, NP  bisoprolol -hydrochlorothiazide  (ZIAC ) 5-6.25 MG tablet Take  1 tablet  Daily  for BP 06/06/22 07/11/22  Tonita Fallow, MD      Allergies    Biaxin [clarithromycin]    Review of Systems   Review of Systems  Constitutional:  Positive for appetite change and fatigue. Negative for fever.  HENT:  Negative for sneezing.   Respiratory:  Negative for cough.   Cardiovascular:  Negative for chest pain and palpitations.  Gastrointestinal:  Positive for abdominal pain. Negative for blood in stool, constipation, nausea and vomiting.  Genitourinary:  Negative for dysuria and hematuria.  Neurological:  Positive for weakness. Negative for dizziness, syncope, light-headedness, numbness and headaches.    Physical Exam Updated Vital Signs BP 122/70   Pulse (!) 109   Temp 99.4 F (37.4 C) (Oral)   Resp (!) 24   SpO2 100%  Physical Exam Constitutional:      General: She is not in acute distress. HENT:     Head: Normocephalic and atraumatic.     Mouth/Throat:     Mouth: Mucous membranes are moist.     Pharynx: Oropharynx is clear. No oropharyngeal exudate or posterior oropharyngeal erythema.  Eyes:     Extraocular Movements: Extraocular movements intact.     Conjunctiva/sclera: Conjunctivae normal.     Pupils: Pupils are equal, round, and reactive to light.  Cardiovascular:     Rate and Rhythm: Tachycardia present.     Heart sounds: No murmur heard. Pulmonary:     Effort: No respiratory distress.     Breath sounds: No wheezing, rhonchi or rales.     Comments: Diminished b/l bases Abdominal:     General: Abdomen is flat. There is no distension.     Palpations: Abdomen is soft.     Tenderness: There is abdominal tenderness. There is no guarding or rebound.     Comments:  Tender to palpation throughout upper abdomen, nontender in lower quadrants  Musculoskeletal:        General: No swelling.     Cervical back: Normal range of motion and neck supple.     Right lower leg: No edema.     Left lower leg: No edema.  Skin:    General: Skin is warm and dry.  Neurological:     General: No focal deficit present.     Mental Status: She is alert.     Cranial Nerves: Cranial nerves 2-12 are intact. No cranial nerve deficit or facial asymmetry.     Sensory: Sensation is intact.     Motor: Motor function is intact. No weakness.     ED Results / Procedures / Treatments   Labs (all labs ordered are listed, but only  abnormal results are displayed) Labs Reviewed  CBC WITH DIFFERENTIAL/PLATELET - Abnormal; Notable for the following components:      Result Value   RBC 3.08 (*)    Hemoglobin 8.5 (*)    HCT 28.3 (*)    RDW 17.6 (*)    Lymphs Abs 0.4 (*)    All other components within normal limits  COMPREHENSIVE METABOLIC PANEL - Abnormal; Notable for the following components:   Sodium 133 (*)    Potassium 2.9 (*)    Calcium 8.7 (*)    Total Protein 6.1 (*)    Albumin 2.5 (*)    Total Bilirubin 1.6 (*)    All other components within normal limits  TSH - Abnormal; Notable for the following components:   TSH 0.213 (*)    All other components within normal limits  I-STAT CHEM 8, ED - Abnormal; Notable for the following components:   Potassium 3.1 (*)    Hemoglobin 8.2 (*)    HCT 24.0 (*)    All other components within normal limits  CULTURE, BLOOD (ROUTINE X 2)  CULTURE, BLOOD (ROUTINE X 2)  LIPASE, BLOOD  URINALYSIS, ROUTINE W REFLEX MICROSCOPIC  I-STAT CG4 LACTIC ACID, ED  TROPONIN I (HIGH SENSITIVITY)  TROPONIN I (HIGH SENSITIVITY)    EKG None  Radiology DG Chest 2 View Result Date: 11/09/2023 CLINICAL DATA:  Shortness of breath. Back pain. Weakness. Lung cancer. EXAM: CHEST - 2 VIEW COMPARISON:  Chest radiograph dated 10/20/2023. FINDINGS:  Left-sided Port-A-Cath in similar position. Bilateral pulmonary nodules consistent with metastatic disease. Similar appearance of masslike opacity in the right upper lobe. No new consolidation. No significant pleural effusion. No pneumothorax. Stable cardiac silhouette. No acute osseous pathology. IMPRESSION: Bilateral pulmonary metastatic disease.  No new findings. Electronically Signed   By: Vanetta Chou M.D.   On: 11/09/2023 16:16    Procedures Procedures    Medications Ordered in ED Medications  potassium chloride  10 mEq in 100 mL IVPB (10 mEq Intravenous New Bag/Given 11/09/23 1956)  sodium chloride  0.9 % bolus 1,000 mL (0 mLs Intravenous Stopped 11/09/23 2036)  sodium chloride  0.9 % bolus 1,000 mL (1,000 mLs Intravenous New Bag/Given 11/09/23 1957)  iohexol  (OMNIPAQUE ) 350 MG/ML injection 100 mL (100 mLs Intravenous Contrast Given 11/09/23 2117)    ED Course/ Medical Decision Making/ A&P                                 Medical Decision Making 63 year old female history of stage IV metastatic lung cancer and history of PE on Eliquis  presenting with 2-day history of progressive weakness and abdominal pain.  Presented by EMS from her pulmonologist office where she was noted to be hypotensive and tachycardic.  BP improved without intervention with EMS but remains tachycardic on arrival.  Chest x-ray at pulmonology office without acute findings aside from her known metastatic lung disease.  Differential for her presentation is broad and includes PE given her history of cancer and PE with current tachycardia and weakness/fatigue.  Other consideration includes anemia, electrolyte imbalance, ACS, arrhythmia, thyroid  disorder.  Abdominal pain may be related to her known metastasis to liver vs other intraabdominal pathology. Hypotension has improved without intervention; currently afebrile without evidence of pneumonia on CXR - lower suspicion for sepsis.   Will obtain CTA chest to rule out PE as  well as CT Abd/pelvis W contrast for further evaluation. Give 1L NS bolus given recent decreased PO  intake. Check EKG, CBC, CMP, lipase, TSH, lactic acid, blood cultures.  8:03 PM  Giving IV potassium for K 2.9. EKG sinus tachycardia with normal Qtc. Hgb 8.5 (transfusion threshold <7). Lactate WNL. Awaiting imaging  10:36 PM CTA chest and CT abd/pelvis shows no acute PE with evidence of multiple metastases though most of these are known and have not significantly progressed compared to prior. However, pt still feeling weak and does not feel ready to go home - still tachycardic, suspect she is likely dehydrated in the setting of recent decreased PO intake. Pt and family at bedside agree with overnight admission for observation and fluids. Paged hospitalist  10:51 PM Discussed with hospitalist who will admit the patient  Amount and/or Complexity of Data Reviewed Labs: ordered. Radiology: ordered.  Risk Prescription drug management. Decision regarding hospitalization.          Final Clinical Impression(s) / ED Diagnoses Final diagnoses:  None    Rx / DC Orders ED Discharge Orders     None         Romelle Booty, MD 11/09/23 2252    Patt Alm Macho, MD 11/14/23 1455

## 2023-11-09 NOTE — ED Triage Notes (Signed)
 Pt BIB EMS from doctors office due to weakness and tachycardia; HR 130 and hypotension; sitting 80/50. Pt c/o generalized weakness, SOB on exertion, and abd pain since Tuesday. Hx of Lung cancer; PE in 2024. Pt is on blood thinners. Port on Left chest.

## 2023-11-09 NOTE — Progress Notes (Signed)
 @Patient  ID: Rebecca Ochoa, female    DOB: [birth year], 63 y.o.   MRN: [id number]  Chief Complaint  Patient presents with   Follow-up    Referring provider: Tonita Fallow, MD  HPI: 63 year old female active smoker seen for pulmonary consult for abnormal CT chest with large right hilar mass and bilateral pulmonary nodules. -Found to have Stage IV metastatic lung cancer-non-small cell carcinoma.  Diagnosed October 2024 Dx with PE 09/2023  Medical history significant for COPD with asthma   TEST/EVENTS :  CT chest 07/21/2023 shows 1.6 cm subcarinal node, right hilar lobulated mass 5.7 x 4.7 cm encasing the right pulmonary vessels, numerous bilateral pulmonary nodules consistent with metastatic disease largest in the left lower lobe 14 mm. Dominant right upper lobe lobulated partially cavitary mass at the pleural surface 5.3 x 4.5 x 4.7 cm. There are heterogeneous left hepatic lobe masses noted as well   Bronch-cytology positive for non-small cell carcinoma  PET scan August 11, 2023 showed hypermetabolic right upper lobe lung mass with large necrotic right hilar/mediastinal mass, innumerable hypermetabolic pulmonary metastatic lesions, hepatic metastatic disease, 2 small hypermetabolic bone lesions in the right scapula and L4  CT chest September 04, 2023 bilateral acute PE  CT chest October 17, 2023 negative for PE, interval decrease in multiple bilateral pulmonary nodules and adenopathy.  Small to moderate left pleural effusion  Oncology notes: 10/31/23 Palliative radiotherapy to the large hilar and mediastinal mass under the care of Dr. Shannon last dose on 09/04/23   Tagrisso  80 mg p.o. daily.  She will start concurrent systemic chemotherapy with carboplatin  for AUC of 5 and Alimta  500 Mg/M2 on November 14, 2023.   11/09/2023 Acute OV :   Patient presents for a posthospital follow-up.  Patient was initially seen in our office October 2020 for for abnormal CT chest with enlarged right upper  lobe and hilar lung mass.  She underwent bronchoscopy with tissue sampling.  Cytology was positive for non-small cell carcinoma.  PET scan on August 11, 2023 showed hypermetabolic right upper lobe lung mass with large necrotic right hilar and mediastinal mass.  Innumerable hypermetabolic pulmonary metastasis lesions, hepatic metastatic disease and sclerotic lesions.  Patient was referred to oncology.  She underwent palliative radiotherapy with last dose on September 04, 2023.  Patient developed acute respiratory distress early November CT chest showed bilateral acute PE.  She was hospitalized and started on heparin  and transition to Eliquis . Month prior to this admission she did have COVID infection and was hospitalized for acute respiratory illness.  Patient was readmitted in late November for SIRS with fever and leukopenia.  She was treated with empiric antibiotics.  Patient was readmitted in late December for acute respiratory failure with underlying pleural effusion., treated with empiric antibiotics.  She underwent thoracentesis with 700 cc of bloody fluid removed no malignant cells on cytology.  She was discharged on empiric antibiotics with doxycycline  and Omnicef .  Prior to discharge was weaned off of oxygen. She continues to follow with oncology.  She is currently on Tagrisso .  Plans to start carboplatin  and Alimta  next week. Patient endorses compliance of Eliquis . Presents for today for an acute office visit with weakness, tachycardia, abdominal and back pain. Feels very weak and no energy. Has had diarrhea that has improved with immodium.  Today in office she was unable to walk had to get wheelchair. Blood pressure 76/40. HR 133bmp. Complains of pain in abdomen and back. No bloody stools.  Chest xray today shows stable metastatic lung  cancer.  Says every time she gets off steroids these symptoms start back with weakness and tachycardia.  Appetite is down. Feels very short of breath , can not walk  any distance without giving out.    Allergies  Allergen Reactions   Biaxin [Clarithromycin] Nausea Only    Immunization History  Administered Date(s) Administered   PPD Test 12/08/2014   Pneumococcal Polysaccharide-23 10/31/1996   Td 10/31/2009   Tdap 10/12/2020    Past Medical History:  Diagnosis Date   Anemia    Asthma, mild intermittent, well-controlled    GERD (gastroesophageal reflux disease)    Hx of migraines    Hyperlipidemia    Hypertension    Vitamin D  deficiency     Tobacco History: Social History   Tobacco Use  Smoking Status Former   Types: E-cigarettes  Smokeless Tobacco Never  Tobacco Comments   Quit in October 2024.  11/09/2023 hfb   Counseling given: Not Answered Tobacco comments: Quit in October 2024.  11/09/2023 hfb   Outpatient Medications Prior to Visit  Medication Sig Dispense Refill   acetaminophen  (TYLENOL ) 500 MG tablet Take 1,000 mg by mouth every 8 (eight) hours as needed for mild pain.     ALPRAZolam  (XANAX ) 0.5 MG tablet Take 1 tablet (0.5 mg total) by mouth 3 (three) times daily as needed for anxiety. 60 tablet 0   apixaban  (ELIQUIS ) 5 MG TABS tablet Take 1 tablet (5 mg total) by mouth 2 (two) times daily. 60 tablet 1   cyanocobalamin  (VITAMIN B12) 500 MCG tablet Take 1 tablet (500 mcg total) by mouth daily. 100 tablet 0   folic acid  (FOLVITE ) 1 MG tablet Take 1 tablet (1 mg total) by mouth daily. 30 tablet 2   HYDROcodone -acetaminophen  (NORCO/VICODIN) 5-325 MG tablet Take 1 tablet by mouth every 6 (six) hours as needed. (Patient taking differently: Take 1 tablet by mouth every 6 (six) hours as needed (for pain).) 21 tablet 0   loperamide  (IMODIUM ) 2 MG capsule Take 1 capsule (2 mg total) by mouth as needed for diarrhea or loose stools. 30 capsule 0   pantoprazole  (PROTONIX ) 40 MG tablet Take 1 tablet (40 mg total) by mouth 2 (two) times daily. 30 tablet 2   TAGRISSO  80 MG tablet Take 80 mg by mouth in the morning. Resume home med with  Tagrisso  every day.     temazepam  (RESTORIL ) 30 MG capsule Take 1 capsule (30 mg total) by mouth at bedtime as needed for sleep. 30 capsule 0   albuterol  (PROVENTIL ) (2.5 MG/3ML) 0.083% nebulizer solution Take 3 mLs (2.5 mg total) by nebulization every 4 (four) hours as needed for wheezing or shortness of breath. (Patient not taking: Reported on 11/09/2023) 75 mL 12   ondansetron  (ZOFRAN ) 4 MG tablet Take 1 tablet (4 mg total) by mouth every 6 (six) hours as needed for nausea. (Patient not taking: Reported on 11/09/2023) 20 tablet 0   ondansetron  (ZOFRAN ) 8 MG tablet Take 8 mg by mouth every 8 (eight) hours as needed for nausea or vomiting. (Patient not taking: Reported on 11/09/2023)     prochlorperazine  (COMPAZINE ) 10 MG tablet Take 10 mg by mouth every 6 (six) hours as needed for nausea or vomiting. (Patient not taking: Reported on 11/09/2023)     feeding supplement (ENSURE ENLIVE / ENSURE PLUS) LIQD Take 237 mLs by mouth 2 (two) times daily between meals. (Patient not taking: Reported on 11/09/2023) 237 mL 12   potassium chloride  SA (KLOR-CON  M) 20 MEQ tablet Take  1 tablet (20 mEq total) by mouth 2 (two) times daily. (Patient not taking: Reported on 11/09/2023) 14 tablet 0   saccharomyces boulardii (FLORASTOR) 250 MG capsule Take 1 capsule (250 mg total) by mouth 2 (two) times daily. (Patient not taking: Reported on 11/09/2023) 60 capsule 0   No facility-administered medications prior to visit.     Review of Systems:   Constitutional:   No  weight loss, night sweats,  Fevers, chills, ++fatigue, or  lassitude.  HEENT:   No headaches,  Difficulty swallowing,  Tooth/dental problems, or  Sore throat,                No sneezing, itching, ear ache, nasal congestion, post nasal drip,   CV:  No chest pain,  Orthopnea, PND, swelling in lower extremities, anasarca, dizziness, palpitations, syncope.   GI  No heartburn, indigestion, abdominal pain, nausea, vomiting, diarrhea, change in bowel habits, loss of  appetite, bloody stools.   Resp:   No chest wall deformity  Skin: no rash or lesions.  GU: no dysuria, change in color of urine, no urgency or frequency.  No flank pain, no hematuria   MS:  No joint pain or swelling.  No decreased range of motion.  No back pain.    Physical Exam  BP (!) 76/40 (BP Location: Right Arm, Cuff Size: Normal)   Pulse (!) 133   Temp 98.8 F (37.1 C) (Oral)   Ht 5' 5 (1.651 m)   Wt 150 lb (68 kg)   SpO2 96%   BMI 24.96 kg/m   GEN: A/Ox3; pleasant , NAD, frail female in wc , pale    HEENT:  [place]/AT, NOSE-clear, THROAT-clear, no lesions, no postnasal drip or exudate noted.   NECK:  Supple w/ fair ROM; no JVD; normal carotid impulses w/o bruits; no thyromegaly or nodules palpated; no lymphadenopathy.    RESP  decreased in bases  no accessory muscle use, no dullness to percussion  CARD:  RRR, no m/r/g, tr peripheral edema, pulses intact, no cyanosis or clubbing.  GI:   Soft & nt; nml bowel sounds; no organomegaly or masses detected.   Musco: Warm bil, no deformities or joint swelling noted.   Neuro: alert, no focal deficits noted.    Skin: Warm, no lesions or rashes    Lab Results:  CBC    Component Value Date/Time   WBC 7.8 10/31/2023 1318   WBC 8.7 10/23/2023 0500   RBC 3.23 (L) 10/31/2023 1318   HGB 9.3 (L) 10/31/2023 1318   HCT 29.0 (L) 10/31/2023 1318   PLT 97 (L) 10/31/2023 1318   MCV 89.8 10/31/2023 1318   MCH 28.8 10/31/2023 1318   MCHC 32.1 10/31/2023 1318   RDW 17.4 (H) 10/31/2023 1318   LYMPHSABS 0.4 (L) 10/31/2023 1318   MONOABS 0.3 10/31/2023 1318   EOSABS 0.0 10/31/2023 1318   BASOSABS 0.0 10/31/2023 1318    BMET    Component Value Date/Time   NA 140 10/31/2023 1318   K 2.5 (LL) 10/31/2023 1318   CL 101 10/31/2023 1318   CO2 32 10/31/2023 1318   GLUCOSE 127 (H) 10/31/2023 1318   BUN 9 10/31/2023 1318   CREATININE 0.49 10/31/2023 1318   CREATININE 1.05 10/12/2022 1442   CALCIUM 8.5 (L) 10/31/2023 1318    GFRNONAA >60 10/31/2023 1318   GFRNONAA 82 10/12/2020 1620   GFRAA 95 10/12/2020 1620    BNP    Component Value Date/Time   BNP 84.8 10/17/2023 0931  ProBNP No results found for: PROBNP  Imaging: DG Chest 2 View Result Date: 11/09/2023 CLINICAL DATA:  Shortness of breath. Back pain. Weakness. Lung cancer. EXAM: CHEST - 2 VIEW COMPARISON:  Chest radiograph dated 10/20/2023. FINDINGS: Left-sided Port-A-Cath in similar position. Bilateral pulmonary nodules consistent with metastatic disease. Similar appearance of masslike opacity in the right upper lobe. No new consolidation. No significant pleural effusion. No pneumothorax. Stable cardiac silhouette. No acute osseous pathology. IMPRESSION: Bilateral pulmonary metastatic disease.  No new findings. Electronically Signed   By: Vanetta Chou M.D.   On: 11/09/2023 16:16   DG Chest 2 View Result Date: 10/20/2023 CLINICAL DATA:  Dyspnea.  Known pulmonary masses. EXAM: CHEST - 2 VIEW COMPARISON:  X-ray 10/19/2023. older exams as well FINDINGS: Tiny left effusion. Underinflation. No pneumothorax or edema. Normal cardiopericardial silhouette. Masslike opacity seen in both lungs. Please correlate for known history. Left subclavian chest port with the tip overlying the SVC right atrial junction. Overlapping cardiac leads. Film is under penetrated. IMPRESSION: Underinflation.  Multiple pulmonary masses.  Tiny left effusion. Electronically Signed   By: Ranell Bring M.D.   On: 10/20/2023 12:05   VAS US  LOWER EXTREMITY VENOUS (DVT) Result Date: 10/19/2023  Lower Venous DVT Study Patient Name:  Rebecca Ochoa  Date of Exam:   10/18/2023 Medical Rec #: [id number]     Accession #:    [id number] Date of Birth: [birth year]      Patient Gender: F Patient Age:   79 years Exam Location:  [facility] Procedure:      VAS US  LOWER EXTREMITY VENOUS (DVT) Referring Phys: MAUDE BANNER --------------------------------------------------------------------------------   Indications: Follow up exam.  Risk Factors: Metastatic lung cancer / chemotherapy. Anticoagulation: Eliquis  (outpatient) & heparin  (inpatient). Comparison Study: Previous exam on 09/05/2023 was positive for DVT of LLE (popv,                   ptv, and perov) Performing Technologist: Jody Hill RVT, RDMS  Examination Guidelines: A complete evaluation includes B-mode imaging, spectral Doppler, color Doppler, and power Doppler as needed of all accessible portions of each vessel. Bilateral testing is considered an integral part of a complete examination. Limited examinations for reoccurring indications may be performed as noted. The reflux portion of the exam is performed with the patient in reverse Trendelenburg.  +---------+---------------+---------+-----------+----------+--------------+ RIGHT    CompressibilityPhasicitySpontaneityPropertiesThrombus Aging +---------+---------------+---------+-----------+----------+--------------+ CFV      Full           Yes      Yes                                 +---------+---------------+---------+-----------+----------+--------------+ SFJ      Full                                                        +---------+---------------+---------+-----------+----------+--------------+ FV Prox  Full           Yes      Yes                                 +---------+---------------+---------+-----------+----------+--------------+ FV Mid   Full           Yes  Yes                                 +---------+---------------+---------+-----------+----------+--------------+ FV DistalFull           Yes      Yes                                 +---------+---------------+---------+-----------+----------+--------------+ PFV      Full                                                        +---------+---------------+---------+-----------+----------+--------------+ POP      Full           Yes      Yes                                  +---------+---------------+---------+-----------+----------+--------------+ PTV      Full                                                        +---------+---------------+---------+-----------+----------+--------------+ PERO     Full                                                        +---------+---------------+---------+-----------+----------+--------------+   +---------+---------------+---------+-----------+----------+--------------+ LEFT     CompressibilityPhasicitySpontaneityPropertiesThrombus Aging +---------+---------------+---------+-----------+----------+--------------+ CFV      Full           Yes      Yes                                 +---------+---------------+---------+-----------+----------+--------------+ SFJ      Full                                                        +---------+---------------+---------+-----------+----------+--------------+ FV Prox  Full           Yes      Yes                                 +---------+---------------+---------+-----------+----------+--------------+ FV Mid   Full           Yes      Yes                                 +---------+---------------+---------+-----------+----------+--------------+ FV DistalFull           Yes      Yes                                 +---------+---------------+---------+-----------+----------+--------------+  PFV      Full                                                        +---------+---------------+---------+-----------+----------+--------------+ POP      Full           Yes      Yes                                 +---------+---------------+---------+-----------+----------+--------------+ PTV      Full                                                        +---------+---------------+---------+-----------+----------+--------------+ PERO     Full                                                         +---------+---------------+---------+-----------+----------+--------------+     Summary: BILATERAL: - No evidence of deep vein thrombosis seen in the lower extremities, bilaterally. -No evidence of popliteal cyst, bilaterally.  LEFT: - Findings suggest resolution of previously noted thrombus.  *See table(s) above for measurements and observations. Electronically signed by Debby Robertson on 10/19/2023 at 10:41:03 AM.    Final    DG Chest Port 1 View Result Date: 10/19/2023 CLINICAL DATA:  Pleural effusion. EXAM: PORTABLE CHEST 1 VIEW COMPARISON:  10/18/2023 FINDINGS: The cardio pericardial silhouette is enlarged. Cardiopericardial silhouette is at upper limits of normal for size. Left-sided Port-A-Cath again noted. Asymmetric elevation right hemidiaphragm with persistent left base atelectasis or infiltrate. Bilateral pulmonary nodules again noted, most dominant in the right upper lung. Bones are diffusely demineralized. IMPRESSION: 1. No substantial change given differential positioning. Persistent left base atelectasis or infiltrate with effusion. 2. Bilateral pulmonary nodules again noted, most dominant in the right upper lung. Electronically Signed   By: Camellia Candle M.D.   On: 10/19/2023 08:44   DG Chest 1 View Result Date: 10/18/2023 CLINICAL DATA:  Hemothorax, acute respiratory failure with hypoxia. EXAM: CHEST  1 VIEW COMPARISON:  10/18/2023. FINDINGS: The heart size and mediastinal contours are stable. There is atherosclerotic calcification of the aorta. A left chest port is stable in position. Scattered opacities are noted in the lungs bilaterally and at the right hilum, similar in appearance to the prior exam. There is a small left pleural effusion. No pneumothorax is seen. Bony structures appear stable. IMPRESSION: 1. Small left pleural effusion. 2. Remaining findings are unchanged. Electronically Signed   By: Leita Birmingham M.D.   On: 10/18/2023 21:54   DG Chest Port 1 View Result Date:  10/18/2023 CLINICAL DATA:  Post thoracentesis. EXAM: PORTABLE CHEST 1 VIEW COMPARISON:  CT 10/17/2023.  Radiographs 10/17/2023 and 09/18/2023. FINDINGS: 1233 hours. Left IJ Port-A-Cath extends to the level of the upper right atrium. The heart size and mediastinal contours are stable. Right hilar mass and multiple pulmonary nodules in both lungs are grossly stable. Left pleural effusion may be slightly  smaller. There is persistent asymmetric left lower lobe airspace disease. No evidence of pneumothorax. The bones appear unchanged. IMPRESSION: 1. No evidence of pneumothorax following thoracentesis. Left pleural effusion may be slightly smaller. 2. Stable right hilar mass and multiple pulmonary nodules consistent with known metastatic disease. Electronically Signed   By: Elsie Perone M.D.   On: 10/18/2023 14:10   CT Angio Chest PE W and/or Wo Contrast Result Date: 10/17/2023 CLINICAL DATA:  Pulmonary embolism (PE) suspected, high prob. Shortness of breath and chest pain. * Tracking Code: BO * EXAM: CT ANGIOGRAPHY CHEST WITH CONTRAST TECHNIQUE: Multidetector CT imaging of the chest was performed using the standard protocol during bolus administration of intravenous contrast. Multiplanar CT image reconstructions and MIPs were obtained to evaluate the vascular anatomy. RADIATION DOSE REDUCTION: This exam was performed according to the departmental dose-optimization program which includes automated exposure control, adjustment of the mA and/or kV according to patient size and/or use of iterative reconstruction technique. CONTRAST:  75mL OMNIPAQUE  IOHEXOL  350 MG/ML SOLN COMPARISON:  CT angiography chest from 09/12/2023. FINDINGS: Cardiovascular: Evaluation of pulmonary embolism beyond the segmental branches is limited due to patient's respiratory motion. There is no embolism to the segmental pulmonary artery level. Normal cardiac size. No pericardial effusion. No aortic aneurysm. There are coronary artery  calcifications, in keeping with coronary artery disease. There are also mild peripheral atherosclerotic vascular calcifications of thoracic aorta and its major branches. Mediastinum/Nodes: There is a 0.8 x 1.1 cm hypoattenuating nodule in the inferior right thyroid  lobe, which is incompletely characterized on the current examination but appears unchanged since the prior study. Redemonstration of multiple enlarged hilar and mediastinal lymph nodes, compatible with metastases. There is mild interval decrease in the dominant right hilar nodal mass which currently measures 4.4 x 5.6 cm, previously 5.2 x 7.0 cm. No axillary lymphadenopathy by size criteria. The esophagus is nondistended precluding optimal assessment. Lungs/Pleura: There is linear filling defect along the lower portion of the lower trachea extending into the right main bronchus, likely mucus/secretion. The central tracheo-bronchial tree is otherwise patent. Since the prior study, there is new small-to-moderate left pleural effusion with associated compressive atelectatic changes. Redemonstration of multiple bilateral lobulated pulmonary masses with largest in the right lung apex measuring 3.5 x 4.6 cm, which previously measured 3.9 x 5.1 cm, when remeasured in similar fashion. The nodule exhibits new central cavitation/Re-aeration of lung parenchyma. No right pleural effusion. No pneumothorax on either side. Many nodules exhibit new surrounding ground-glass changes. Upper Abdomen: There are persistent ill-defined, hypoattenuating masses in the liver with largest in the left hepatic lobe, segment 2 measuring 5.0 x 8.1 cm. These are incompletely characterized on the current examination but highly concerning for metastases. There are ill-defined hypoattenuating lesions in the spleen as well, also favored to represent metastases. Remaining visualized upper abdominal viscera within normal limits. Musculoskeletal: A CT Port-a-Cath is seen in the left upper  chest wall with the catheter terminating in the cavo-atrial junction region. Visualized soft tissues of the chest wall are otherwise grossly unremarkable. No suspicious osseous lesions. There are mild multilevel degenerative changes in the visualized spine. Review of the MIP images confirms the above findings. IMPRESSION: 1. No embolism to the segmental pulmonary artery level. 2. Interval decrease in the multiple bilateral lung nodules as well as hilar/mediastinal lymphadenopathy. Also there are new ground-glass changes surrounding multiple lung nodules, which may also represent posttreatment changes. Please see above for details. 3. There is new small-to-moderate left pleural effusion with associated compressive atelectatic changes.  No right pleural effusion. 4. There are ill-defined hypoattenuating masses in the liver and spleen, highly concerning for metastases. Further imaging with dedicated contrast-enhanced MRI abdomen as per liver mass protocol is recommended. 5. Multiple other nonacute observations, as described above. Aortic Atherosclerosis (ICD10-I70.0). Electronically Signed   By: Ree Molt M.D.   On: 10/17/2023 13:41   DG Chest Port 1 View Result Date: 10/17/2023 CLINICAL DATA:  Dyspnea.  Shortness of breath and chest pain. EXAM: PORTABLE CHEST 1 VIEW COMPARISON:  09/18/2023. FINDINGS: Multiple lobular opacities are again seen throughout bilateral lungs, right more than left, compatible with patient's known history of metastases. There is interval decrease in the size of the lesions when compared to the prior radiograph from 09/18/2023. However, since the prior study, there is new left retrocardiac airspace opacity obscuring the left hemidiaphragm, descending thoracic aorta and blunting the left lateral costophrenic angle suggesting combination of left lower lobe atelectasis and/or consolidation with pleural effusion. Right lateral costophrenic angle is clear. Stable cardio-mediastinal  silhouette. No acute osseous abnormalities. The soft tissues are within normal limits. Redemonstration of left-sided CT Port-A-Cath with its tip overlying the cavoatrial junction region, unchanged. IMPRESSION: *New left retrocardiac opacity, as described above. *Interval decrease in the multiple lobular opacities throughout bilateral lungs, suggesting interval improvement in the metastases. Electronically Signed   By: Ree Molt M.D.   On: 10/17/2023 11:57    cyanocobalamin  (VITAMIN B12) injection 1,000 mcg     Date Action Dose Route User   Discharged on 10/23/2023   Admitted on 10/17/2023   Discharged on 09/25/2023   Admitted on 09/18/2023   Discharged on 09/12/2023   Admitted on 09/12/2023   09/12/2023 1002 Given 1,000 mcg Intramuscular (Left Deltoid) Laurelyn Knee B, LPN      potassium chloride  10 mEq in 100 mL IVPB     Date Action Dose Route User   10/31/2023 1544 New Bag/Given 10 mEq Intravenous Catha Andrea HERO, RN   10/31/2023 1438 Infusion Verify (none) Intravenous Lesli Cree, RN   10/31/2023 1438 New Bag/Given 10 mEq Intravenous Shellia Almarie BROCKS, RN      0.9 %  sodium chloride  infusion     Date Action Dose Route User   10/31/2023 1720 Rate/Dose Change (none) Intravenous Lesli Cree, RN   10/31/2023 1719 Rate/Dose Change (none) Intravenous Hardin, Myrtle, RN   10/31/2023 1707 Infusion Verify (none) Intravenous Lesli Cree, RN   10/31/2023 1704 Rate/Dose Change (none) Intravenous Lesli Cree, RN   10/31/2023 1703 Rate/Dose Change (none) Intravenous Lesli Cree, RN      sodium chloride  flush (NS) 0.9 % injection 10 mL     Date Action Dose Route User   10/31/2023 1320 Given 10 mL Intracatheter Mungo, Maria L, LPN           No data to display          No results found for: NITRICOXIDE      Assessment & Plan:   Hypotension Hypotensive , tachycardia and progressive weakness- unclear etiolgy  Setting of metastatic lung  cancer. Patient will require transfer to ER for further evaluation and treatment options./consideration for hospital admission.  Recurrent admissions with respiratory failure, and PE . ? [place]  related Report to EMS, patient in stable condition. Rebecca Madelin Stank, NP 11/09/2023

## 2023-11-09 NOTE — H&P (Addendum)
 History and Physical    Patient: Rebecca Ochoa FMW:[id number] DOB: 01-Oct-1961 DOA: 11/09/2023 DOS: the patient was seen and examined on 11/09/2023 PCP: Tonita Fallow, MD  Patient coming from: Home  Chief Complaint:  Chief Complaint  Patient presents with   Tachycardia   Weakness   Hypotension   HPI: Rebecca Ochoa is a 63 y.o. female with medical history significant lung cancer with widespread mets currently on oral chemotherapy, diarrhea due to to that oral chemotherapy, recurrent hypokalemia due to diarrhea presented to her routine pulmonology appointment today.  The patient has been feeling fairly miserable for the last 3 days.  She completed her prednisone  taper from her recent hospitalization 5 days ago.  She says often times after coming off of a prednisone  taper she feels pretty bad.  In the pulmonology office today her systolic blood pressure was in the 70s and her heart rate was in the 120s.  The patient has been more short of breath than usual more fatigued and just overall does not feel well.  She denies fever or shaking chills.  She denies any dysuria or urinary difficulty.  She uses Imodium  for her diarrhea and that is slightly improved today.  She has been eating better but not quite drinking as much fluids as she should. She tells me once the ambulance put her on the gurney and lifted her feet her blood pressure improved.  When she arrived in the emergency department her blood pressures were better in the 120s systolic.  After her full ED evaluation it was decided to admit her for persistent tachycardia and hypokalemia and likely dehydration.    Review of Systems: As mentioned in the history of present illness. All other systems reviewed and are negative. Past Medical History:  Diagnosis Date   Anemia    Asthma, mild intermittent, well-controlled    GERD (gastroesophageal reflux disease)    Hx of migraines    Hyperlipidemia    Hypertension    Vitamin D  deficiency    Past  Surgical History:  Procedure Laterality Date   BREAST SURGERY     reduction   BRONCHIAL BIOPSY  08/08/2023   Procedure: BRONCHIAL BIOPSIES;  Surgeon: Shelah Lamar RAMAN, MD;  Location: [facility] ENDOSCOPY;  Service: Pulmonary;;   BRONCHIAL BRUSHINGS  08/08/2023   Procedure: BRONCHIAL BRUSHINGS;  Surgeon: Shelah Lamar RAMAN, MD;  Location: [facility] ENDOSCOPY;  Service: Pulmonary;;   BRONCHIAL NEEDLE ASPIRATION BIOPSY  08/08/2023   Procedure: BRONCHIAL NEEDLE ASPIRATION BIOPSIES;  Surgeon: Shelah Lamar RAMAN, MD;  Location: [facility] ENDOSCOPY;  Service: Pulmonary;;   ENDOBRONCHIAL ULTRASOUND Bilateral 08/08/2023   Procedure: ENDOBRONCHIAL ULTRASOUND;  Surgeon: Shelah Lamar RAMAN, MD;  Location: [facility] ENDOSCOPY;  Service: Pulmonary;  Laterality: Bilateral;   HEMOSTASIS CONTROL  08/08/2023   Procedure: HEMOSTASIS CONTROL;  Surgeon: Shelah Lamar RAMAN, MD;  Location: [facility] ENDOSCOPY;  Service: Pulmonary;;   IR IMAGING GUIDED PORT INSERTION  09/01/2023   REDUCTION MAMMAPLASTY     TUBAL LIGATION     Social History:  reports that she has quit smoking. Her smoking use included e-cigarettes. She has never used smokeless tobacco. She reports current alcohol use of about 3.0 standard drinks of alcohol per week. She reports that she does not use drugs.  Allergies  Allergen Reactions   Biaxin [Clarithromycin] Nausea Only    Family History  Problem Relation Age of Onset   Heart disease Father    Hypertension Father    Diabetes Father    Kidney disease Father    Cancer  Brother     Prior to Admission medications   Medication Sig Start Date End Date Taking? Authorizing Provider  acetaminophen  (TYLENOL ) 500 MG tablet Take 1,000 mg by mouth every 8 (eight) hours as needed for mild pain.   Yes [provider]  albuterol  (PROVENTIL ) (2.5 MG/3ML) 0.083% nebulizer solution Take 3 mLs (2.5 mg total) by nebulization every 4 (four) hours as needed for wheezing or shortness of breath. Patient not taking: Reported on 11/09/2023 10/23/23    Pokhrel, Laxman, MD  ALPRAZolam  (XANAX ) 0.5 MG tablet Take 1 tablet (0.5 mg total) by mouth 3 (three) times daily as needed for anxiety. 10/11/23  Yes Wilkinson, Dana E, NP  apixaban  (ELIQUIS ) 5 MG TABS tablet Take 1 tablet (5 mg total) by mouth 2 (two) times daily. 09/25/23  Yes Barbarann Nest, MD  cyanocobalamin  (VITAMIN B12) 500 MCG tablet Take 1 tablet (500 mcg total) by mouth daily. 10/23/23 01/31/24 Yes Pokhrel, Laxman, MD  folic acid  (FOLVITE ) 1 MG tablet Take 1 tablet (1 mg total) by mouth daily. 09/12/23  Yes Heilingoetter, Cassandra L, PA-C  HYDROcodone -acetaminophen  (NORCO/VICODIN) 5-325 MG tablet Take 1 tablet by mouth every 6 (six) hours as needed. Patient taking differently: Take 1 tablet by mouth every 6 (six) hours as needed (for pain). 09/07/23   Samtani, Jai-Gurmukh, MD  loperamide  (IMODIUM ) 2 MG capsule Take 1 capsule (2 mg total) by mouth as needed for diarrhea or loose stools. 10/23/23  Yes Pokhrel, Laxman, MD  ondansetron  (ZOFRAN ) 4 MG tablet Take 1 tablet (4 mg total) by mouth every 6 (six) hours as needed for nausea. Patient not taking: Reported on 11/09/2023 08/17/23   Tobie Yetta HERO, MD  ondansetron  (ZOFRAN ) 8 MG tablet Take 8 mg by mouth every 8 (eight) hours as needed for nausea or vomiting. Patient not taking: Reported on 11/09/2023    [provider]  pantoprazole  (PROTONIX ) 40 MG tablet Take 1 tablet (40 mg total) by mouth 2 (two) times daily. 09/07/23   Samtani, Jai-Gurmukh, MD  prochlorperazine  (COMPAZINE ) 10 MG tablet Take 10 mg by mouth every 6 (six) hours as needed for nausea or vomiting. Patient not taking: Reported on 11/09/2023    [provider]  TAGRISSO  80 MG tablet Take 80 mg by mouth in the morning. Resume home med with Tagrisso  every day.    [provider]  temazepam  (RESTORIL ) 30 MG capsule Take 1 capsule (30 mg total) by mouth at bedtime as needed for sleep. 10/27/23   Pickenpack-Cousar, Fannie SAILOR, NP  bisoprolol -hydrochlorothiazide   (ZIAC ) 5-6.25 MG tablet Take  1 tablet  Daily  for BP 06/06/22 07/11/22  Tonita Fallow, MD    Physical Exam: Vitals:   11/09/23 1726 11/09/23 1815 11/09/23 1935 11/09/23 2045  BP: 109/75 108/71 115/71 122/70  Pulse: (!) 129 (!) 124 (!) 120 (!) 109  Resp: 16 (!) 28 (!) 31 (!) 24  Temp: 99.3 F (37.4 C)  99.4 F (37.4 C)   TempSrc: Oral  Oral   SpO2: 97% 95% 98% 100%   Physical Exam:  General: No acute distress, frail, chronically ill appearing HEENT: Normocephalic, atraumatic, PERRL Cardiovascular: Normal rate and rhythm. Distal pulses intact. Pulmonary: Normal pulmonary effort, normal breath sounds Gastrointestinal: Distended abdomen, soft, non-tender at this time, normoactive bowel sounds Musculoskeletal:Normal ROM, no lower ext edema Lymphadenopathy: No cervical LAD. Skin: Skin is warm and dry. Neuro: No focal deficits noted, AAOx3. PSYCH: Attentive and cooperative  Data Reviewed:  Results for orders placed or performed during the hospital encounter  of 11/09/23 (from the past 24 hours)  CBC with Differential     Status: Abnormal   Collection Time: 11/09/23  6:30 PM  Result Value Ref Range   WBC 5.5 4.0 - 10.5 K/uL   RBC 3.08 (L) 3.87 - 5.11 MIL/uL   Hemoglobin 8.5 (L) 12.0 - 15.0 g/dL   HCT 71.6 (L) 63.9 - 53.9 %   MCV 91.9 80.0 - 100.0 fL   MCH 27.6 26.0 - 34.0 pg   MCHC 30.0 30.0 - 36.0 g/dL   RDW 82.3 (H) 88.4 - 84.4 %   Platelets 160 150 - 400 K/uL   nRBC 0.0 0.0 - 0.2 %   Neutrophils Relative % 84 %   Neutro Abs 4.6 1.7 - 7.7 K/uL   Lymphocytes Relative 7 %   Lymphs Abs 0.4 (L) 0.7 - 4.0 K/uL   Monocytes Relative 8 %   Monocytes Absolute 0.4 0.1 - 1.0 K/uL   Eosinophils Relative 0 %   Eosinophils Absolute 0.0 0.0 - 0.5 K/uL   Basophils Relative 0 %   Basophils Absolute 0.0 0.0 - 0.1 K/uL   Immature Granulocytes 1 %   Abs Immature Granulocytes 0.04 0.00 - 0.07 K/uL  Comprehensive metabolic panel     Status: Abnormal   Collection Time: 11/09/23  6:30 PM   Result Value Ref Range   Sodium 133 (L) 135 - 145 mmol/L   Potassium 2.9 (L) 3.5 - 5.1 mmol/L   Chloride 100 98 - 111 mmol/L   CO2 24 22 - 32 mmol/L   Glucose, Bld 98 70 - 99 mg/dL   BUN 11 8 - 23 mg/dL   Creatinine, Ser 9.49 0.44 - 1.00 mg/dL   Calcium 8.7 (L) 8.9 - 10.3 mg/dL   Total Protein 6.1 (L) 6.5 - 8.1 g/dL   Albumin 2.5 (L) 3.5 - 5.0 g/dL   AST 22 15 - 41 U/L   ALT 19 0 - 44 U/L   Alkaline Phosphatase 99 38 - 126 U/L   Total Bilirubin 1.6 (H) 0.0 - 1.2 mg/dL   GFR, Estimated >39 >39 mL/min   Anion gap 9 5 - 15  Lipase, blood     Status: None   Collection Time: 11/09/23  6:30 PM  Result Value Ref Range   Lipase 34 11 - 51 U/L  TSH     Status: Abnormal   Collection Time: 11/09/23  6:30 PM  Result Value Ref Range   TSH 0.213 (L) 0.350 - 4.500 uIU/mL  Troponin I (High Sensitivity)     Status: None   Collection Time: 11/09/23  6:30 PM  Result Value Ref Range   Troponin I (High Sensitivity) 4 <18 ng/L  I-stat chem 8, ED (not at [facility], [facility] or [facility])     Status: Abnormal   Collection Time: 11/09/23  6:40 PM  Result Value Ref Range   Sodium 135 135 - 145 mmol/L   Potassium 3.1 (L) 3.5 - 5.1 mmol/L   Chloride 100 98 - 111 mmol/L   BUN 8 8 - 23 mg/dL   Creatinine, Ser 9.49 0.44 - 1.00 mg/dL   Glucose, Bld 95 70 - 99 mg/dL   Calcium, Ion 8.80 8.84 - 1.40 mmol/L   TCO2 24 22 - 32 mmol/L   Hemoglobin 8.2 (L) 12.0 - 15.0 g/dL   HCT 75.9 (L) 63.9 - 53.9 %  I-Stat CG4 Lactic Acid     Status: None   Collection Time: 11/09/23  6:41 PM  Result Value  Ref Range   Lactic Acid, Venous 0.7 0.5 - 1.9 mmol/L  Troponin I (High Sensitivity)     Status: None   Collection Time: 11/09/23  7:43 PM  Result Value Ref Range   Troponin I (High Sensitivity) 5 <18 ng/L     Assessment and Plan: Tachycardia/ hypotension/ weak -I suspect the patient may have some adrenal insufficiency as she just finished her steroid taper 4 days ago.   - She is asking if her steroids can be resumed but not  tonight because they cause terrible insomnia when she takes steroids at night.  Her vitals are better with IV fluids though she remains slightly tachycardic.  I will honor her request. -She will need a much longer steroid taper versus continued small dose daily steroid dose in perpetuity for symptom control depending on her prognosis.  She breathes better and eats better and feels better in general when she is on steroids.   2.  Known lung cancer with widespread mets - Her CAT scan reveals that the met metastases are progressing.  -Consult her oncologist Dr. Gatha in the a.m.  3.  Hypokalemia secondary to diarrhea - replete aggressively.  Check magnesium .  4.  Chronic diarrhea due to [place].  The patient reports that her diarrhea was much worse after her recent course of antibiotics.  Will check for C. Difficile.  5. Anemia - monitor hgb.     Advance Care Planning:   Code Status: Prior the patient names her husband [name] as her social research officer, government and she wants to be full code.  Consults: None at this time  Family Communication: The patient's husband was at the bedside.  Severity of Illness: The appropriate patient status for this patient is INPATIENT. Inpatient status is judged to be reasonable and necessary in order to provide the required intensity of service to ensure the patient's safety. The patient's presenting symptoms, physical exam findings, and initial radiographic and laboratory data in the context of their chronic comorbidities is felt to place them at high risk for further clinical deterioration. Furthermore, it is not anticipated that the patient will be medically stable for discharge from the hospital within 2 midnights of admission.   * I certify that at the point of admission it is my clinical judgment that the patient will require inpatient hospital care spanning beyond 2 midnights from the point of admission due to high intensity of service, high risk for further  deterioration and high frequency of surveillance required.*  Author: ARTHEA CHILD, MD 11/09/2023 11:02 PM  For on call review [email or web address].

## 2023-11-09 NOTE — Assessment & Plan Note (Addendum)
 Hypotensive , tachycardia and progressive weakness- unclear etiolgy  Setting of metastatic lung cancer. Patient will require transfer to ER for further evaluation and treatment options./consideration for hospital admission.  Recurrent admissions with respiratory failure, and PE . ? [place]  related Report to EMS, patient in stable condition. SABRA

## 2023-11-10 ENCOUNTER — Other Ambulatory Visit (HOSPITAL_COMMUNITY): Payer: Self-pay

## 2023-11-10 LAB — BASIC METABOLIC PANEL
Anion gap: 5 (ref 5–15)
BUN: 6 mg/dL — ABNORMAL LOW (ref 8–23)
CO2: 22 mmol/L (ref 22–32)
Calcium: 7.7 mg/dL — ABNORMAL LOW (ref 8.9–10.3)
Chloride: 107 mmol/L (ref 98–111)
Creatinine, Ser: 0.31 mg/dL — ABNORMAL LOW (ref 0.44–1.00)
GFR, Estimated: >60 mL/min (ref >60)
Glucose, Bld: 81 mg/dL (ref 70–99)
Potassium: 3.2 mmol/L — ABNORMAL LOW (ref 3.5–5.1)
Sodium: 134 mmol/L — ABNORMAL LOW (ref 135–145)

## 2023-11-10 LAB — MAGNESIUM: Magnesium: 1.8 mg/dL (ref 1.7–2.4)

## 2023-11-10 MED ORDER — APIXABAN 5 MG PO TABS
5.0000 mg | ORAL_TABLET | Freq: Two times a day (BID) | ORAL | Status: DC
  Administered 2023-11-10 (×2): 5 mg via ORAL
  Filled 2023-11-10 (×2): qty 1

## 2023-11-10 MED ORDER — HEPARIN SOD (PORK) LOCK FLUSH 100 UNIT/ML IV SOLN
500.0000 [IU] | INTRAVENOUS | Status: AC | PRN
  Administered 2023-11-10: 500 [IU]
  Filled 2023-11-10: qty 5

## 2023-11-10 MED ORDER — POTASSIUM CHLORIDE CRYS ER 20 MEQ PO TBCR
40.0000 meq | EXTENDED_RELEASE_TABLET | Freq: Once | ORAL | Status: AC
  Administered 2023-11-10: 40 meq via ORAL
  Filled 2023-11-10: qty 2

## 2023-11-10 MED ORDER — METHYLPREDNISOLONE SODIUM SUCC 125 MG IJ SOLR
125.0000 mg | Freq: Once | INTRAMUSCULAR | Status: AC
  Administered 2023-11-10: 125 mg via INTRAVENOUS
  Filled 2023-11-10: qty 2

## 2023-11-10 MED ORDER — PREDNISONE 20 MG PO TABS: 40.0000 mg | ORAL_TABLET | Freq: Every day | ORAL | Status: DC

## 2023-11-10 MED ORDER — PREDNISONE 10 MG PO TABS
ORAL_TABLET | ORAL | 0 refills | Status: DC
  Filled 2023-11-10: qty 30, 12d supply, fill #0

## 2023-11-10 MED ORDER — PANTOPRAZOLE SODIUM 40 MG PO TBEC
40.0000 mg | DELAYED_RELEASE_TABLET | Freq: Two times a day (BID) | ORAL | 0 refills | Status: DC
  Filled 2023-11-10: qty 60, 30d supply, fill #0

## 2023-11-10 MED ORDER — POTASSIUM CHLORIDE 10 MEQ/100ML IV SOLN
10.0000 meq | INTRAVENOUS | Status: AC
  Administered 2023-11-10: 10 meq via INTRAVENOUS
  Filled 2023-11-10 (×2): qty 100

## 2023-11-10 MED ORDER — POTASSIUM CHLORIDE CRYS ER 20 MEQ PO TBCR
20.0000 meq | EXTENDED_RELEASE_TABLET | Freq: Every day | ORAL | 7 refills | Status: DC
  Filled 2023-11-10: qty 7, 7d supply, fill #0
  Filled 2023-11-12 – 2023-11-13 (×3): qty 7, 7d supply, fill #1

## 2023-11-10 MED ORDER — LACTATED RINGERS IV SOLN: INTRAVENOUS | Status: AC

## 2023-11-10 MED ORDER — CHLORHEXIDINE GLUCONATE CLOTH 2 % EX PADS: 6.0000 | MEDICATED_PAD | Freq: Every day | CUTANEOUS | Status: DC

## 2023-11-10 MED ORDER — POTASSIUM CHLORIDE CRYS ER 20 MEQ PO TBCR: 40.0000 meq | EXTENDED_RELEASE_TABLET | ORAL | Status: DC

## 2023-11-10 NOTE — ED Notes (Signed)
 Dr came by and said to give patient 2 runs of potassium and something to eat    walk her after the potassium runs are done and see what her heart rate is    he stated 120-130 is her normal   higher than that to let him know

## 2023-11-10 NOTE — Discharge Summary (Signed)
 Physician Discharge Summary  Rebecca Ochoa FMW:[id number] DOB: Dec 21, 1960 DOA: 11/09/2023  PCP: Tonita Fallow, MD  Admit date: 11/09/2023 Discharge date: 11/10/2023 Recommendations for Outpatient Follow-up:  Follow up with PCP in 1 weeks-call for appointment Please obtain BMP/CBC in one week  Discharge Dispo: Home Discharge Condition: Stable Code Status:   Code Status: Prior Diet recommendation:  Diet Order             Diet regular Room service appropriate? Yes; Fluid consistency: Thin  Diet effective now                    Brief/Interim Summary: 63 YOF w/ significant history of lung cancer with widespread mets currently on oral chemotherapy, and having diarrhea due to the oral chemotherapy, recurrent hypokalemia presented to her routine pulmonology appointment 1/9 c/o feeling fairly miserable x 3 days. She completed her prednisone  taper from her recent hospitalization 5 days ago. She says often times after coming off of a prednisone  taper she feels pretty bad.  Patient was hypotensive in 70s in the pulmonary office today with heart rate in 120s, short of breath, fatigue but no fever chills dysuria. Per patient once the ambulance put her on the gurney and lifted her feet her blood pressure improved.  In the ED BP stable but tachycardic in 120s, afebrile, not hypoxic.  Labs showed hyponatremia hypokalemia troponin negative lactic acid normal anemia and 8 g. Underwent chest x-ray CT angio chest abdomen pelvis>> no PE, multifocal bilateral masses/pulmonary nodules some decreased some increased showing mixed response to therapy, left pleural effusion resolved, hilar lymphadenopathy increasing also with thyroid  nodules, multifocal lobulated centrally necrotic hepatic masses unchanged from 12/17, innumerable hypodensities throughout the liver new from 10/11, necrotic periportal and post caval lymph nodes unchanged along with splenic hypodensities unchanged.Patient is admitted due to persistent  tachycardia.  Recheck showed mild hypokalemia was replaced, patient feeling much improved tolerating diet and she wants to go home today.  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  Patient had been on prednisone  question adrenal insufficiency as she endorses she feels better with the steroid and instead has been started.  Was recently tapered off 5 days PTA.  Encourage oral intake continue gentle hydration.  Will let her oncology decide regarding steroid further course possibly long taper versus indefinite use given chemo use  Widespread metastatic lung cancer: CT abdomen pelvis chest shows mixed response with widespread metastatic disease.  Dr. Sherrod has been consulted.  Patient was supposed to start concurrent chemotherapy with carboplatin  but has been delayed due to her poor general condition per note from 12/31.  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  Diarrhea stable.Now back on a steroid  Hypokalemia Due to diarrhea monitor replete   Anemia of malignancy: Hemoglobin stable 9 to 10 g.  Consults: none Subjective: Aaox3 tolerating well, walked well, HR STABLE AND COMFORTABLE GOING HOME  Discharge Exam: Vitals:   11/10/23 0930 11/10/23 1200  BP: 108/71 107/68  Pulse: ROLLEN)  107 (!) 108  Resp: (!) 33 (!) 24  Temp:  (!) 97.5 F (36.4 C)  SpO2: 96% 97%   General: Pt is alert, awake, not in acute distress Cardiovascular: RRR, S1/S2  +, no rubs, no gallops Respiratory: CTA bilaterally, no wheezing, no rhonchi Abdominal: Soft, NT, ND, bowel sounds + Extremities: no edema, no cyanosis  Discharge Instructions  Discharge Instructions     Discharge instructions   Complete by: As directed    Please call call MD or return to ER for similar or worsening recurring problem that brought you to hospital or if any fever,nausea/vomiting,abdominal pain, uncontrolled pain, chest pain,  shortness of breath or any other alarming symptoms.  Please follow-up with Dr. Gatha from oncology and discuss about a steroid regimen  Please follow-up your doctor as instructed in a week time and call the office for appointment.  Please avoid alcohol, smoking, or any other illicit substance and maintain healthy habits including taking your regular medications as prescribed.  You were cared for by a hospitalist during your hospital stay. If you have any questions about your discharge medications or the care you received while you were in the hospital after you are discharged, you can call the unit and ask to speak with the hospitalist on call if the hospitalist that took care of you is not available.  Once you are discharged, your primary care physician will handle any further medical issues. Please note that NO REFILLS for any discharge medications will be authorized once you are discharged, as it is imperative that you return to your primary care physician (or establish a relationship with a primary care physician if you do not have one) for your aftercare needs so that they can reassess your need for medications and monitor your lab values   Increase activity slowly   Complete by: As directed       Allergies as of 11/10/2023       Reactions   Biaxin [clarithromycin] Nausea Only        Medication List     TAKE these medications    acetaminophen  500 MG tablet Commonly known as: TYLENOL  Take 1,000 mg by mouth every 8 (eight) hours as  needed for mild pain.   albuterol  (2.5 MG/3ML) 0.083% nebulizer solution Commonly known as: PROVENTIL  Take 3 mLs (2.5 mg total) by nebulization every 4 (four) hours as needed for wheezing or shortness of breath.   ALPRAZolam  0.5 MG tablet Commonly known as: XANAX  Take 1 tablet (0.5 mg total) by mouth 3 (three) times daily as needed for anxiety.   apixaban  5 MG Tabs tablet Commonly known as: ELIQUIS  Take 1 tablet (5 mg total) by mouth 2 (two) times daily.   cyanocobalamin  500 MCG tablet Commonly known as: VITAMIN B12 Take 1 tablet (500 mcg total) by mouth daily.   folic acid  1 MG tablet Commonly known as: FOLVITE  Take 1 tablet (1 mg total) by mouth daily.   HYDROcodone -acetaminophen  5-325 MG tablet Commonly known as: NORCO/VICODIN Take 1 tablet by mouth every 6 (six) hours as needed. What changed: reasons to take this   loperamide  2 MG capsule Commonly known as: IMODIUM  Take 1 capsule (2 mg total) by mouth as needed for diarrhea or loose stools.   ondansetron  8 MG tablet Commonly known as: ZOFRAN  Take 8 mg by mouth every 8 (eight) hours as needed for nausea or vomiting.   ondansetron  4 MG tablet Commonly known as: ZOFRAN  Take 1 tablet (4 mg total) by mouth every  6 (six) hours as needed for nausea.   pantoprazole  40 MG tablet Commonly known as: PROTONIX  Take 1 tablet (40 mg total) by mouth 2 (two) times daily.   potassium chloride  SA 20 MEQ tablet Commonly known as: KLOR-CON  M Take 1 tablet (20 mEq total) by mouth daily for 7 days.   predniSONE  10 MG tablet Commonly known as: DELTASONE  Take PO 4 tabs daily x 3 days,3 tabs daily x 3 days,2 tabs daily x 3 days,1 tab daily x 3 days then stop.  Discussed with oncology about stopping   prochlorperazine  10 MG tablet Commonly known as: COMPAZINE  Take 10 mg by mouth every 6 (six) hours as needed for nausea or vomiting.   Tagrisso  80 MG tablet Generic drug: osimertinib  mesylate Take 80 mg by mouth in the morning. Resume  home med with Tagrisso  every day.   temazepam  30 MG capsule Commonly known as: RESTORIL  Take 1 capsule (30 mg total) by mouth at bedtime as needed for sleep.        Follow-up Information     Tonita Fallow, MD Follow up in 1 week(s).   Specialty: Internal Medicine Contact information: [address] [place] [address] [phone]         Sherrod Sherrod, MD Follow up.   Specialty: Oncology Contact information: [address] [place] [address] [phone]                Allergies  Allergen Reactions   Biaxin [Clarithromycin] Nausea Only    The results of significant diagnostics from this hospitalization (including imaging, microbiology, ancillary and laboratory) are listed below for reference.    Microbiology: Recent Results (from the past 240 hours)  Blood culture (routine x 2)     Status: None (Preliminary result)   Collection Time: 11/09/23  6:30 PM   Specimen: BLOOD  Result Value Ref Range Status   Specimen Description   Final    BLOOD PORTA CATH Performed at [facility], [address], [place], [place] [address]    Special Requests   Final    BOTTLES DRAWN AEROBIC AND ANAEROBIC Blood Culture adequate volume Performed at [facility], [address], [place], [place] [address]    Culture   Final    NO GROWTH < 12 HOURS Performed at [facility], [address], [place], [place] [address]    Report Status PENDING  Incomplete  Blood culture (routine x 2)     Status: None (Preliminary result)   Collection Time: 11/09/23  7:43 PM   Specimen: BLOOD  Result Value Ref Range Status   Specimen Description   Final    BLOOD BLOOD LEFT ARM Performed at [facility], [address] [place], [place] [address]    Special Requests   Final    BOTTLES DRAWN AEROBIC AND ANAEROBIC Blood Culture adequate volume Performed at [facility], [address], [place], [place] [address]    Culture   Final    NO GROWTH < 12 HOURS Performed at [facility], [address], [place], [place] [address]    Report Status PENDING  Incomplete    Procedures/Studies: CT Angio Chest PE W and/or Wo Contrast Result Date: 11/09/2023 CLINICAL DATA:  History of non-small-cell lung cancer with weakness, tachycardia, hypotension, generalized weakness, shortness of breath, and abdominal pain. * Tracking Code: BO * EXAM: CT ANGIOGRAPHY CHEST CT ABDOMEN AND PELVIS WITH CONTRAST TECHNIQUE: Multidetector  CT imaging of the chest was performed using the standard protocol during bolus administration of intravenous contrast. Multiplanar CT image reconstructions and MIPs were obtained to evaluate the vascular anatomy. Multidetector CT imaging of the abdomen and pelvis was performed using the standard protocol during bolus administration of intravenous contrast. RADIATION DOSE REDUCTION: This exam was performed according to the departmental dose-optimization program which includes automated exposure control, adjustment of the mA and/or kV according to patient size and/or use of iterative reconstruction technique. CONTRAST:  OMNIPAQUE  IOHEXOL  350 MG/ML SOLN COMPARISON:  Same day chest radiograph, CTA chest dated 10/17/2023, and multiple priors including nuclear medicine PET-CT dated 08/11/2023 FINDINGS: CTA CHEST FINDINGS Cardiovascular: Left chest wall port tip terminates in the right atrium. The study is high quality for the evaluation of pulmonary embolism. There are no filling defects in the central, lobar, segmental or subsegmental pulmonary artery branches to suggest acute pulmonary embolism. Similar marked effacement of right upper lobe pulmonary arteries. Great vessels are normal in course and caliber. Normal heart size. No significant pericardial fluid/thickening. Coronary artery calcifications. Mediastinum/Nodes: 1.5 cm right thyroid  nodule (4:20), slowly  increasing in size since 09/04/2023 when it was subtly seen and measured 5 mm. Normal esophagus. Right hilar mass as below. 11 mm left hilar lymph node (4:62), previously 4 mm. Lungs/Pleura: The central airways are patent. Apical predominant paraseptal emphysema. Multifocal bilateral masses and pulmonary nodules, many of which demonstrate cavitary changes. Interval slight decrease in size of dominant right upper lobe mass measuring 4.1 x 3.0 cm (12:32), previously 4.5 x 3.2 cm. A few nodules are increased in size, for example 1.5 x 1.0 cm right lower lobe (12:49), previously 1.2 x 0.9 cm. Others are unchanged, for example right hilar mass encasing the right pulmonary vessels and bronchi measuring 5.4 x 5.2 cm (12:54). Interval cavitation of a few nodules, for example left upper lobe (12:49) and right lower lobe (12:60). No pneumothorax. Interval resolution of left pleural effusion. Musculoskeletal: No acute or abnormal lytic or blastic osseous lesions. Review of the MIP images confirms the above findings. CT ABDOMEN and PELVIS FINDINGS Hepatobiliary: Multifocal, lobulated centrally necrotic masses are not substantially changed in size from 10/17/2023 but increased in size from 08/11/2023, for example 8.3 x 4.5 cm segment 2/3 (2:25), previously 6.0 x 3.3 cm on 08/11/2023. innumerable subcentimeter hypodensities throughout the liver are new from 08/11/2023. No intra or extrahepatic biliary ductal dilation. Normal gallbladder. Pancreas: No focal lesions or main ductal dilation. Spleen: Unchanged multifocal hypoattenuating lesions. Adrenals/Urinary Tract: No adrenal nodules. Multifocal bilateral renal hypodensities, which do not measure simple fluid attenuation, and a few of which appear slightly increased in size, for example 1.8 cm right interpolar (2:32), previously 1.2 cm on 08/14/2023. No hydronephrosis or calculi. No focal bladder wall thickening. Stomach/Bowel: Normal appearance of the stomach. No evidence of  bowel wall thickening, distention, or inflammatory changes. Colonic diverticulosis without acute diverticulitis. Normal appendix. Vascular/Lymphatic: Aortic atherosclerosis. Unchanged size of necrotic periportal node measuring 2.1 cm (2:32) and portacaval node measuring 2.2 cm (2:30). ( Reproductive: No adnexal masses. Other: No free fluid, fluid collection, or free air. Musculoskeletal: No acute or abnormal lytic or blastic osseous lesions to correspond to sites of prior radiotracer uptake. Multilevel degenerative changes of the lumbar spine. IMPRESSION: CTA CHEST: 1. No evidence of pulmonary embolism. 2. Multifocal bilateral masses and pulmonary nodules, some of which are slightly decreased in size and others are increased in size, consistent with mixed response to therapy. 3. Interval resolution of left pleural  effusion. 4. Interval enlargement of left hilar lymphadenopathy, likely metastasis. 5. Slowly enlarging 1.5 cm right thyroid  nodule, suspicious for metastasis. CT ABDOMEN AND PELVIS: 1. No acute abnormality in the abdomen or pelvis. 2. Multifocal, lobulated centrally necrotic hepatic masses are not substantially changed in size from 10/17/2023 but increased in size from PET dated 08/11/2023. Innumerable subcentimeter hypodensities throughout the liver are new from 08/11/2023 and suspicious for metastatic disease. 3. Multifocal bilateral renal hypodensities, which do not measure simple fluid attenuation, and a few of which appear slightly increased in size from 08/14/2023, suspicious for metastatic disease. 4. Unchanged size of necrotic periportal and portacaval lymph nodes compared to 10/17/2023. 5. Unchanged splenic hypodensities, indeterminate. Aortic Atherosclerosis (ICD10-I70.0) and Emphysema (ICD10-J43.9). Electronically Signed   By: Limin  Xu M.D.   On: 11/09/2023 22:13   CT ABDOMEN PELVIS W CONTRAST Result Date: 11/09/2023 CLINICAL DATA:  History of non-small-cell lung cancer with weakness,  tachycardia, hypotension, generalized weakness, shortness of breath, and abdominal pain. * Tracking Code: BO * EXAM: CT ANGIOGRAPHY CHEST CT ABDOMEN AND PELVIS WITH CONTRAST TECHNIQUE: Multidetector CT imaging of the chest was performed using the standard protocol during bolus administration of intravenous contrast. Multiplanar CT image reconstructions and MIPs were obtained to evaluate the vascular anatomy. Multidetector CT imaging of the abdomen and pelvis was performed using the standard protocol during bolus administration of intravenous contrast. RADIATION DOSE REDUCTION: This exam was performed according to the departmental dose-optimization program which includes automated exposure control, adjustment of the mA and/or kV according to patient size and/or use of iterative reconstruction technique. CONTRAST:  OMNIPAQUE  IOHEXOL  350 MG/ML SOLN COMPARISON:  Same day chest radiograph, CTA chest dated 10/17/2023, and multiple priors including nuclear medicine PET-CT dated 08/11/2023 FINDINGS: CTA CHEST FINDINGS Cardiovascular: Left chest wall port tip terminates in the right atrium. The study is high quality for the evaluation of pulmonary embolism. There are no filling defects in the central, lobar, segmental or subsegmental pulmonary artery branches to suggest acute pulmonary embolism. Similar marked effacement of right upper lobe pulmonary arteries. Great vessels are normal in course and caliber. Normal heart size. No significant pericardial fluid/thickening. Coronary artery calcifications. Mediastinum/Nodes: 1.5 cm right thyroid  nodule (4:20), slowly increasing in size since 09/04/2023 when it was subtly seen and measured 5 mm. Normal esophagus. Right hilar mass as below. 11 mm left hilar lymph node (4:62), previously 4 mm. Lungs/Pleura: The central airways are patent. Apical predominant paraseptal emphysema. Multifocal bilateral masses and pulmonary nodules, many of which demonstrate cavitary changes.  Interval slight decrease in size of dominant right upper lobe mass measuring 4.1 x 3.0 cm (12:32), previously 4.5 x 3.2 cm. A few nodules are increased in size, for example 1.5 x 1.0 cm right lower lobe (12:49), previously 1.2 x 0.9 cm. Others are unchanged, for example right hilar mass encasing the right pulmonary vessels and bronchi measuring 5.4 x 5.2 cm (12:54). Interval cavitation of a few nodules, for example left upper lobe (12:49) and right lower lobe (12:60). No pneumothorax. Interval resolution of left pleural effusion. Musculoskeletal: No acute or abnormal lytic or blastic osseous lesions. Review of the MIP images confirms the above findings. CT ABDOMEN and PELVIS FINDINGS Hepatobiliary: Multifocal, lobulated centrally necrotic masses are not substantially changed in size from 10/17/2023 but increased in size from 08/11/2023, for example 8.3 x 4.5 cm segment 2/3 (2:25), previously 6.0 x 3.3 cm on 08/11/2023. innumerable subcentimeter hypodensities throughout the liver are new from 08/11/2023. No intra or extrahepatic biliary ductal dilation. Normal  gallbladder. Pancreas: No focal lesions or main ductal dilation. Spleen: Unchanged multifocal hypoattenuating lesions. Adrenals/Urinary Tract: No adrenal nodules. Multifocal bilateral renal hypodensities, which do not measure simple fluid attenuation, and a few of which appear slightly increased in size, for example 1.8 cm right interpolar (2:32), previously 1.2 cm on 08/14/2023. No hydronephrosis or calculi. No focal bladder wall thickening. Stomach/Bowel: Normal appearance of the stomach. No evidence of bowel wall thickening, distention, or inflammatory changes. Colonic diverticulosis without acute diverticulitis. Normal appendix. Vascular/Lymphatic: Aortic atherosclerosis. Unchanged size of necrotic periportal node measuring 2.1 cm (2:32) and portacaval node measuring 2.2 cm (2:30). ( Reproductive: No adnexal masses. Other: No free fluid, fluid collection,  or free air. Musculoskeletal: No acute or abnormal lytic or blastic osseous lesions to correspond to sites of prior radiotracer uptake. Multilevel degenerative changes of the lumbar spine. IMPRESSION: CTA CHEST: 1. No evidence of pulmonary embolism. 2. Multifocal bilateral masses and pulmonary nodules, some of which are slightly decreased in size and others are increased in size, consistent with mixed response to therapy. 3. Interval resolution of left pleural effusion. 4. Interval enlargement of left hilar lymphadenopathy, likely metastasis. 5. Slowly enlarging 1.5 cm right thyroid  nodule, suspicious for metastasis. CT ABDOMEN AND PELVIS: 1. No acute abnormality in the abdomen or pelvis. 2. Multifocal, lobulated centrally necrotic hepatic masses are not substantially changed in size from 10/17/2023 but increased in size from PET dated 08/11/2023. Innumerable subcentimeter hypodensities throughout the liver are new from 08/11/2023 and suspicious for metastatic disease. 3. Multifocal bilateral renal hypodensities, which do not measure simple fluid attenuation, and a few of which appear slightly increased in size from 08/14/2023, suspicious for metastatic disease. 4. Unchanged size of necrotic periportal and portacaval lymph nodes compared to 10/17/2023. 5. Unchanged splenic hypodensities, indeterminate. Aortic Atherosclerosis (ICD10-I70.0) and Emphysema (ICD10-J43.9). Electronically Signed   By: Limin  Xu M.D.   On: 11/09/2023 22:13   DG Chest 2 View Result Date: 11/09/2023 CLINICAL DATA:  Shortness of breath. Back pain. Weakness. Lung cancer. EXAM: CHEST - 2 VIEW COMPARISON:  Chest radiograph dated 10/20/2023. FINDINGS: Left-sided Port-A-Cath in similar position. Bilateral pulmonary nodules consistent with metastatic disease. Similar appearance of masslike opacity in the right upper lobe. No new consolidation. No significant pleural effusion. No pneumothorax. Stable cardiac silhouette. No acute osseous pathology.  IMPRESSION: Bilateral pulmonary metastatic disease.  No new findings. Electronically Signed   By: Vanetta Chou M.D.   On: 11/09/2023 16:16   DG Chest 2 View Result Date: 10/20/2023 CLINICAL DATA:  Dyspnea.  Known pulmonary masses. EXAM: CHEST - 2 VIEW COMPARISON:  X-ray 10/19/2023. older exams as well FINDINGS: Tiny left effusion. Underinflation. No pneumothorax or edema. Normal cardiopericardial silhouette. Masslike opacity seen in both lungs. Please correlate for known history. Left subclavian chest port with the tip overlying the SVC right atrial junction. Overlapping cardiac leads. Film is under penetrated. IMPRESSION: Underinflation.  Multiple pulmonary masses.  Tiny left effusion. Electronically Signed   By: Ranell Bring M.D.   On: 10/20/2023 12:05   VAS US  LOWER EXTREMITY VENOUS (DVT) Result Date: 10/19/2023  Lower Venous DVT Study Patient Name:  AANYA HAYNES  Date of Exam:   10/18/2023 Medical Rec #: [id number]     Accession #:    [id number] Date of Birth: [birth year]      Patient Gender: F Patient Age:   20 years Exam Location:  [facility] Procedure:      VAS US  LOWER EXTREMITY VENOUS (DVT) Referring Phys: MAUDE BANNER --------------------------------------------------------------------------------  Indications:  Follow up exam.  Risk Factors: Metastatic lung cancer / chemotherapy. Anticoagulation: Eliquis  (outpatient) & heparin  (inpatient). Comparison Study: Previous exam on 09/05/2023 was positive for DVT of LLE (popv,                   ptv, and perov) Performing Technologist: Jody Hill RVT, RDMS  Examination Guidelines: A complete evaluation includes B-mode imaging, spectral Doppler, color Doppler, and power Doppler as needed of all accessible portions of each vessel. Bilateral testing is considered an integral part of a complete examination. Limited examinations for reoccurring indications may be performed as noted. The reflux portion of the exam is performed with the patient in reverse  Trendelenburg.  +---------+---------------+---------+-----------+----------+--------------+ RIGHT    CompressibilityPhasicitySpontaneityPropertiesThrombus Aging +---------+---------------+---------+-----------+----------+--------------+ CFV      Full           Yes      Yes                                 +---------+---------------+---------+-----------+----------+--------------+ SFJ      Full                                                        +---------+---------------+---------+-----------+----------+--------------+ FV Prox  Full           Yes      Yes                                 +---------+---------------+---------+-----------+----------+--------------+ FV Mid   Full           Yes      Yes                                 +---------+---------------+---------+-----------+----------+--------------+ FV DistalFull           Yes      Yes                                 +---------+---------------+---------+-----------+----------+--------------+ PFV      Full                                                        +---------+---------------+---------+-----------+----------+--------------+ POP      Full           Yes      Yes                                 +---------+---------------+---------+-----------+----------+--------------+ PTV      Full                                                        +---------+---------------+---------+-----------+----------+--------------+ PERO     Full                                                        +---------+---------------+---------+-----------+----------+--------------+   +---------+---------------+---------+-----------+----------+--------------+  LEFT     CompressibilityPhasicitySpontaneityPropertiesThrombus Aging +---------+---------------+---------+-----------+----------+--------------+ CFV      Full           Yes      Yes                                  +---------+---------------+---------+-----------+----------+--------------+ SFJ      Full                                                        +---------+---------------+---------+-----------+----------+--------------+ FV Prox  Full           Yes      Yes                                 +---------+---------------+---------+-----------+----------+--------------+ FV Mid   Full           Yes      Yes                                 +---------+---------------+---------+-----------+----------+--------------+ FV DistalFull           Yes      Yes                                 +---------+---------------+---------+-----------+----------+--------------+ PFV      Full                                                        +---------+---------------+---------+-----------+----------+--------------+ POP      Full           Yes      Yes                                 +---------+---------------+---------+-----------+----------+--------------+ PTV      Full                                                        +---------+---------------+---------+-----------+----------+--------------+ PERO     Full                                                        +---------+---------------+---------+-----------+----------+--------------+     Summary: BILATERAL: - No evidence of deep vein thrombosis seen in the lower extremities, bilaterally. -No evidence of popliteal cyst, bilaterally.  LEFT: - Findings suggest resolution of previously noted thrombus.  *See table(s) above for measurements and observations. Electronically signed by Debby Robertson on 10/19/2023 at 10:41:03 AM.    Final    DG Chest [facility]  Result Date: 10/19/2023 CLINICAL DATA:  Pleural effusion. EXAM: PORTABLE CHEST 1 VIEW COMPARISON:  10/18/2023 FINDINGS: The cardio pericardial silhouette is enlarged. Cardiopericardial silhouette is at upper limits of normal for size. Left-sided Port-A-Cath again noted.  Asymmetric elevation right hemidiaphragm with persistent left base atelectasis or infiltrate. Bilateral pulmonary nodules again noted, most dominant in the right upper lung. Bones are diffusely demineralized. IMPRESSION: 1. No substantial change given differential positioning. Persistent left base atelectasis or infiltrate with effusion. 2. Bilateral pulmonary nodules again noted, most dominant in the right upper lung. Electronically Signed   By: Camellia Candle M.D.   On: 10/19/2023 08:44   DG Chest 1 View Result Date: 10/18/2023 CLINICAL DATA:  Hemothorax, acute respiratory failure with hypoxia. EXAM: CHEST  1 VIEW COMPARISON:  10/18/2023. FINDINGS: The heart size and mediastinal contours are stable. There is atherosclerotic calcification of the aorta. A left chest port is stable in position. Scattered opacities are noted in the lungs bilaterally and at the right hilum, similar in appearance to the prior exam. There is a small left pleural effusion. No pneumothorax is seen. Bony structures appear stable. IMPRESSION: 1. Small left pleural effusion. 2. Remaining findings are unchanged. Electronically Signed   By: Leita Birmingham M.D.   On: 10/18/2023 21:54   DG Chest Port 1 View Result Date: 10/18/2023 CLINICAL DATA:  Post thoracentesis. EXAM: PORTABLE CHEST 1 VIEW COMPARISON:  CT 10/17/2023.  Radiographs 10/17/2023 and 09/18/2023. FINDINGS: 1233 hours. Left IJ Port-A-Cath extends to the level of the upper right atrium. The heart size and mediastinal contours are stable. Right hilar mass and multiple pulmonary nodules in both lungs are grossly stable. Left pleural effusion may be slightly smaller. There is persistent asymmetric left lower lobe airspace disease. No evidence of pneumothorax. The bones appear unchanged. IMPRESSION: 1. No evidence of pneumothorax following thoracentesis. Left pleural effusion may be slightly smaller. 2. Stable right hilar mass and multiple pulmonary nodules consistent with known  metastatic disease. Electronically Signed   By: Elsie Perone M.D.   On: 10/18/2023 14:10   CT Angio Chest PE W and/or Wo Contrast Result Date: 10/17/2023 CLINICAL DATA:  Pulmonary embolism (PE) suspected, high prob. Shortness of breath and chest pain. * Tracking Code: BO * EXAM: CT ANGIOGRAPHY CHEST WITH CONTRAST TECHNIQUE: Multidetector CT imaging of the chest was performed using the standard protocol during bolus administration of intravenous contrast. Multiplanar CT image reconstructions and MIPs were obtained to evaluate the vascular anatomy. RADIATION DOSE REDUCTION: This exam was performed according to the departmental dose-optimization program which includes automated exposure control, adjustment of the mA and/or kV according to patient size and/or use of iterative reconstruction technique. CONTRAST:  75mL OMNIPAQUE  IOHEXOL  350 MG/ML SOLN COMPARISON:  CT angiography chest from 09/12/2023. FINDINGS: Cardiovascular: Evaluation of pulmonary embolism beyond the segmental branches is limited due to patient's respiratory motion. There is no embolism to the segmental pulmonary artery level. Normal cardiac size. No pericardial effusion. No aortic aneurysm. There are coronary artery calcifications, in keeping with coronary artery disease. There are also mild peripheral atherosclerotic vascular calcifications of thoracic aorta and its major branches. Mediastinum/Nodes: There is a 0.8 x 1.1 cm hypoattenuating nodule in the inferior right thyroid  lobe, which is incompletely characterized on the current examination but appears unchanged since the prior study. Redemonstration of multiple enlarged hilar and mediastinal lymph nodes, compatible with metastases. There is mild interval decrease in the dominant right hilar nodal mass which currently measures 4.4 x 5.6 cm, previously 5.2 x 7.0 cm.  No axillary lymphadenopathy by size criteria. The esophagus is nondistended precluding optimal assessment. Lungs/Pleura: There  is linear filling defect along the lower portion of the lower trachea extending into the right main bronchus, likely mucus/secretion. The central tracheo-bronchial tree is otherwise patent. Since the prior study, there is new small-to-moderate left pleural effusion with associated compressive atelectatic changes. Redemonstration of multiple bilateral lobulated pulmonary masses with largest in the right lung apex measuring 3.5 x 4.6 cm, which previously measured 3.9 x 5.1 cm, when remeasured in similar fashion. The nodule exhibits new central cavitation/Re-aeration of lung parenchyma. No right pleural effusion. No pneumothorax on either side. Many nodules exhibit new surrounding ground-glass changes. Upper Abdomen: There are persistent ill-defined, hypoattenuating masses in the liver with largest in the left hepatic lobe, segment 2 measuring 5.0 x 8.1 cm. These are incompletely characterized on the current examination but highly concerning for metastases. There are ill-defined hypoattenuating lesions in the spleen as well, also favored to represent metastases. Remaining visualized upper abdominal viscera within normal limits. Musculoskeletal: A CT Port-a-Cath is seen in the left upper chest wall with the catheter terminating in the cavo-atrial junction region. Visualized soft tissues of the chest wall are otherwise grossly unremarkable. No suspicious osseous lesions. There are mild multilevel degenerative changes in the visualized spine. Review of the MIP images confirms the above findings. IMPRESSION: 1. No embolism to the segmental pulmonary artery level. 2. Interval decrease in the multiple bilateral lung nodules as well as hilar/mediastinal lymphadenopathy. Also there are new ground-glass changes surrounding multiple lung nodules, which may also represent posttreatment changes. Please see above for details. 3. There is new small-to-moderate left pleural effusion with associated compressive atelectatic changes. No  right pleural effusion. 4. There are ill-defined hypoattenuating masses in the liver and spleen, highly concerning for metastases. Further imaging with dedicated contrast-enhanced MRI abdomen as per liver mass protocol is recommended. 5. Multiple other nonacute observations, as described above. Aortic Atherosclerosis (ICD10-I70.0). Electronically Signed   By: Ree Molt M.D.   On: 10/17/2023 13:41   DG Chest Port 1 View Result Date: 10/17/2023 CLINICAL DATA:  Dyspnea.  Shortness of breath and chest pain. EXAM: PORTABLE CHEST 1 VIEW COMPARISON:  09/18/2023. FINDINGS: Multiple lobular opacities are again seen throughout bilateral lungs, right more than left, compatible with patient's known history of metastases. There is interval decrease in the size of the lesions when compared to the prior radiograph from 09/18/2023. However, since the prior study, there is new left retrocardiac airspace opacity obscuring the left hemidiaphragm, descending thoracic aorta and blunting the left lateral costophrenic angle suggesting combination of left lower lobe atelectasis and/or consolidation with pleural effusion. Right lateral costophrenic angle is clear. Stable cardio-mediastinal silhouette. No acute osseous abnormalities. The soft tissues are within normal limits. Redemonstration of left-sided CT Port-A-Cath with its tip overlying the cavoatrial junction region, unchanged. IMPRESSION: *New left retrocardiac opacity, as described above. *Interval decrease in the multiple lobular opacities throughout bilateral lungs, suggesting interval improvement in the metastases. Electronically Signed   By: Ree Molt M.D.   On: 10/17/2023 11:57    Labs: BNP (last 3 results) Recent Labs    09/04/23 1802 09/12/23 1149 10/17/23 0931  BNP 34.3 67.4 84.8   Basic Metabolic Panel: Recent Labs  Lab 11/09/23 1830 11/09/23 1840 11/09/23 2330 11/10/23 0620  NA 133* 135  --  134*  K 2.9* 3.1*  --  3.2*  CL 100 100  --   107  CO2 24  --   --  22  GLUCOSE 98 95  --  81  BUN 11 8  --  6*  CREATININE 0.50 0.50  --  0.31*  CALCIUM 8.7*  --   --  7.7*  MG  --   --  1.7 1.8   Liver Function Tests: Recent Labs  Lab 11/09/23 1830  AST 22  ALT 19  ALKPHOS 99  BILITOT 1.6*  PROT 6.1*  ALBUMIN 2.5*   Recent Labs  Lab 11/09/23 1830  LIPASE 34   No results for input(s): AMMONIA in the last 168 hours. CBC: Recent Labs  Lab 11/09/23 1830 11/09/23 1840  WBC 5.5  --   NEUTROABS 4.6  --   HGB 8.5* 8.2*  HCT 28.3* 24.0*  MCV 91.9  --   PLT 160  --    Cardiac Enzymes: No results for input(s): CKTOTAL, CKMB, CKMBINDEX, TROPONINI in the last 168 hours. BNP: Invalid input(s): POCBNP CBG: No results for input(s): GLUCAP in the last 168 hours. D-Dimer No results for input(s): DDIMER in the last 72 hours. Hgb A1c No results for input(s): HGBA1C in the last 72 hours. Lipid Profile No results for input(s): CHOL, HDL, LDLCALC, TRIG, CHOLHDL, LDLDIRECT in the last 72 hours. Thyroid  function studies Recent Labs    11/09/23 1830  TSH 0.213*   Anemia work up No results for input(s): VITAMINB12, FOLATE, FERRITIN, TIBC, IRON, RETICCTPCT in the last 72 hours. Urinalysis    Component Value Date/Time   COLORURINE YELLOW 09/18/2023 1646   APPEARANCEUR HAZY (A) 09/18/2023 1646   LABSPEC 1.020 09/18/2023 1646   PHURINE 7.0 09/18/2023 1646   GLUCOSEU NEGATIVE 09/18/2023 1646   HGBUR SMALL (A) 09/18/2023 1646   BILIRUBINUR NEGATIVE 09/18/2023 1646   KETONESUR 5 (A) 09/18/2023 1646   PROTEINUR NEGATIVE 09/18/2023 1646   UROBILINOGEN 0.2 12/08/2014 1019   NITRITE NEGATIVE 09/18/2023 1646   LEUKOCYTESUR TRACE (A) 09/18/2023 1646   Sepsis Labs Recent Labs  Lab 11/09/23 1830  WBC 5.5   Microbiology Recent Results (from the past 240 hours)  Blood culture (routine x 2)     Status: None (Preliminary result)   Collection Time: 11/09/23  6:30 PM   Specimen:  BLOOD  Result Value Ref Range Status   Specimen Description   Final    BLOOD PORTA CATH Performed at [facility], [address] [place], [place] [address]    Special Requests   Final    BOTTLES DRAWN AEROBIC AND ANAEROBIC Blood Culture adequate volume Performed at [facility], [address] [place], [place] [address]    Culture   Final    NO GROWTH < 12 HOURS Performed at [facility], [address], [place], [place] [address]    Report Status PENDING  Incomplete  Blood culture (routine x 2)     Status: None (Preliminary result)   Collection Time: 11/09/23  7:43 PM   Specimen: BLOOD  Result Value Ref Range Status   Specimen Description   Final    BLOOD BLOOD LEFT ARM Performed at [facility], [address] [place], [place] [address]    Special Requests   Final    BOTTLES DRAWN AEROBIC AND ANAEROBIC Blood Culture adequate volume Performed at [facility], [address] [place], [place] [address]    Culture   Final    NO GROWTH < 12 HOURS Performed at [facility], [address], [place], [place] [address]  Report Status PENDING  Incomplete     Time coordinating discharge: 25 minutes  SIGNED: Mennie LAMY, MD  [facility] 11/10/2023, 1:28 PM  If 7PM-7AM, please contact night-coverage [email or web address]

## 2023-11-10 NOTE — Evaluation (Signed)
 Physical Therapy Evaluation Patient Details Name: Rebecca Ochoa MRN: [id number] DOB: 03/15/1961 Today's Date: 11/10/2023  History of Present Illness  Pt is 63 yo female admitted on 11/09/23 with tachycardia and weakness.  Pt with hypokalemia and has received potassium.  Pt has hx including but not limited to lung CA with widespread mets, on oral chemo, recurrent hypokalemia, anemia, HTN  Clinical Impression  Pt admitted with above diagnosis. At baseline, pt resides with spouse and uses rollator in home.  She has assist for stairs and ADLS as needed.  Does report elevated HR recent baseline.  Today, pt ambulating 150' with RW and supervision.  HR max was 133 bpm with O2 sats 98-99%.  Will benefit from acute PT while hospitalized - expect no PT needs at d/c.  Pt currently with functional limitations due to the deficits listed below (see PT Problem List). Pt will benefit from acute skilled PT to increase their independence and safety with mobility to allow discharge.           If plan is discharge home, recommend the following: A little help with walking and/or transfers;A little help with bathing/dressing/bathroom;Assistance with cooking/housework;Help with stairs or ramp for entrance   Can travel by private vehicle        Equipment Recommendations None recommended by PT  Recommendations for Other Services       Functional Status Assessment Patient has had a recent decline in their functional status and demonstrates the ability to make significant improvements in function in a reasonable and predictable amount of time.     Precautions / Restrictions Precautions Precautions: Fall Precaution Comments: monitor HR      Mobility  Bed Mobility Overal bed mobility: Needs Assistance Bed Mobility: Supine to Sit     Supine to sit: Contact guard     General bed mobility comments: increased time, use of rail, sleeps in recliner at home    Transfers Overall transfer level: Needs  assistance Equipment used: Rolling walker (2 wheels) Transfers: Sit to/from Stand Sit to Stand: Contact guard assist           General transfer comment: CGA safety    Ambulation/Gait Ambulation/Gait assistance: Contact guard assist, Supervision Gait Distance (Feet): 150 Feet Assistive device: Rolling walker (2 wheels) Gait Pattern/deviations: Step-through pattern Gait velocity: functional     General Gait Details: Started CGA progressed to supervision. Some cues for turning RW - typically uses rollator. HR up to 133 bpm  Stairs            Wheelchair Mobility     Tilt Bed    Modified Rankin (Stroke Patients Only)       Balance Overall balance assessment: Needs assistance Sitting-balance support: No upper extremity supported Sitting balance-Leahy Scale: Good     Standing balance support: Bilateral upper extremity supported, Reliant on assistive device for balance Standing balance-Leahy Scale: Poor Standing balance comment: steady with RW                             Pertinent Vitals/Pain Pain Assessment Pain Assessment: No/denies pain    Home Living Family/patient expects to be discharged to:: Private residence Living Arrangements: Spouse/significant other Available Help at Discharge: Family;Available 24 hours/day Type of Home: House Home Access: Stairs to enter Entrance Stairs-Rails: None Entrance Stairs-Number of Steps: 3   Home Layout: Multi-level;Able to live on main level with bedroom/bathroom Home Equipment: Rollator (4 wheels);Shower Counsellor (2 wheels);Cane - single point;BSC/3in1  Additional Comments: sleeps in recliner    Prior Function Prior Level of Function : Needs assist             Mobility Comments: Ambulates in home with rollator; assist with stairs ADLs Comments: patient has help from significant other for ADLs as needed.     Extremity/Trunk Assessment   Upper Extremity Assessment Upper Extremity  Assessment: Overall WFL for tasks assessed    Lower Extremity Assessment Lower Extremity Assessment: LLE deficits/detail;RLE deficits/detail RLE Deficits / Details: ROM WFL; MMT: ankle 5/5, knee ext 4/5, hip flexion and abd 3/5 LLE Deficits / Details: ROM WFL; MMT: ankle 5/5, knee ext 4/5, hip flexion and abd 3/5    Cervical / Trunk Assessment Cervical / Trunk Assessment: Normal  Communication      Cognition Arousal: Alert Behavior During Therapy: WFL for tasks assessed/performed Overall Cognitive Status: Within Functional Limits for tasks assessed                                          General Comments General comments (skin integrity, edema, etc.): HR was 107 bpm rest and 133 bpm max walking.  O2 sats 98-99% on RA.    Exercises  Provided HEP: Access Code: [email or web address] URL: [email or web address] Date: 11/10/2023 Prepared by: Lyndell Allaire  Program Notes These are just some examples.  You do not have to do them all, can vary from day to day and just do as able. The easiest method for stairs is step to pattern (both feet on same step) with sequencing of up with good first (Left) and then down with bad (right) first.   Exercises - Supine Bridge  - 3 x daily - 7 x weekly - 1 sets - 10 reps - Supine Heel Slide  - 3 x daily - 7 x weekly - 1 sets - 10 reps - Small Range Straight Leg Raise  - 3 x daily - 7 x weekly - 1 sets - 10 reps - Sidelying Hip Abduction  - 3 x daily - 7 x weekly - 1 sets - 10 reps - Sit to Stand  - 3 x daily - 7 x weekly - 1 sets - 10 reps - Seated March  - 3 x daily - 7 x weekly - 1 sets - 10 reps - Standing Heel Raise with Support  - 3 x daily - 7 x weekly - 1 sets - 10 reps   Assessment/Plan    PT Assessment Patient needs continued PT services  PT Problem List Decreased strength;Decreased range of motion;Decreased activity tolerance;Decreased balance;Decreased mobility;Decreased knowledge of use of DME;Cardiopulmonary status  limiting activity       PT Treatment Interventions DME instruction;Gait training;Stair training;Functional mobility training;Therapeutic activities;Therapeutic exercise;Patient/family education;Neuromuscular re-education;Balance training    PT Goals (Current goals can be found in the Care Plan section)  Acute Rehab PT Goals Patient Stated Goal: return home PT Goal Formulation: With patient Time For Goal Achievement: 11/24/23 Potential to Achieve Goals: Good    Frequency Min 1X/week     Co-evaluation               AM-PAC PT 6 Clicks Mobility  Outcome Measure Help needed turning from your back to your side while in a flat bed without using bedrails?: None Help needed moving from lying on your back to sitting on the side of a flat bed without using bedrails?: A  Little Help needed moving to and from a bed to a chair (including a wheelchair)?: A Little Help needed standing up from a chair using your arms (e.g., wheelchair or bedside chair)?: A Little Help needed to walk in hospital room?: A Little Help needed climbing 3-5 steps with a railing? : A Little 6 Click Score: 19    End of Session Equipment Utilized During Treatment: Gait belt Activity Tolerance: Patient tolerated treatment well Patient left: in chair;with call bell/phone within reach;with family/visitor present Nurse Communication: Mobility status PT Visit Diagnosis: Difficulty in walking, not elsewhere classified (R26.2)    Time: 8699-8674 PT Time Calculation (min) (ACUTE ONLY): 25 min   Charges:   PT Evaluation $PT Eval Low Complexity: 1 Low PT Treatments $Therapeutic Exercise: 8-22 mins PT General Charges $$ ACUTE PT VISIT: 1 Visit         Benjiman, PT Acute Rehab Services [facility] [phone]   Benjiman VEAR Mulberry 11/10/2023, 1:44 PM

## 2023-11-10 NOTE — Progress Notes (Signed)
 AVS reviewed w/ pt & husband who verbalized an understanding - pt is out of pantoprazole  & asked for a refill - done by Dr CHRISTOBAL. Pt dressed for d/c - home w/ spouse- will pick up med on th eway home- other TOC meds in secure bag delivered to pt in room by this RN

## 2023-11-10 NOTE — Hospital Course (Addendum)
 68 YOF w/ significant history of lung cancer with widespread mets currently on oral chemotherapy, and having diarrhea due to the oral chemotherapy, recurrent hypokalemia presented to her routine pulmonology appointment 1/9 c/o feeling fairly miserable x 3 days. She completed her prednisone  taper from her recent hospitalization 5 days ago. She says often times after coming off of a prednisone  taper she feels pretty bad.  Patient was hypotensive in 70s in the pulmonary office today with heart rate in 120s, short of breath, fatigue but no fever chills dysuria. Per patient once the ambulance put her on the gurney and lifted her feet her blood pressure improved.  In the ED BP stable but tachycardic in 120s, afebrile, not hypoxic.  Labs showed hyponatremia hypokalemia troponin negative lactic acid normal anemia and 8 g. Underwent chest x-ray CT angio chest abdomen pelvis>> no PE, multifocal bilateral masses/pulmonary nodules some decreased some increased showing mixed response to therapy, left pleural effusion resolved, hilar lymphadenopathy increasing also with thyroid  nodules, multifocal lobulated centrally necrotic hepatic masses unchanged from 12/17, innumerable hypodensities throughout the liver new from 10/11, necrotic periportal and post caval lymph nodes unchanged along with splenic hypodensities unchanged.Patient is admitted due to persistent tachycardia.  Recheck showed mild hypokalemia was replaced, patient feeling much improved tolerating diet and she wants to go home today.  Will ambulate and if she does okay and vitals remained stable we will discharge her and instruct her to follow-up with her oncology soon

## 2023-11-13 ENCOUNTER — Other Ambulatory Visit (HOSPITAL_COMMUNITY): Payer: Self-pay

## 2023-11-14 ENCOUNTER — Other Ambulatory Visit: Payer: Self-pay | Admitting: Nurse Practitioner

## 2023-11-14 ENCOUNTER — Telehealth: Payer: Self-pay | Admitting: Nurse Practitioner

## 2023-11-14 LAB — CULTURE, BLOOD (ROUTINE X 2)
Culture: NO GROWTH
Culture: NO GROWTH
Special Requests: ADEQUATE
Special Requests: ADEQUATE

## 2023-11-14 MED ORDER — PANTOPRAZOLE SODIUM 40 MG PO TBEC: 40.0000 mg | DELAYED_RELEASE_TABLET | Freq: Two times a day (BID) | ORAL | 0 refills | Status: DC

## 2023-11-14 MED ORDER — POTASSIUM CHLORIDE CRYS ER 20 MEQ PO TBCR: 20.0000 meq | EXTENDED_RELEASE_TABLET | Freq: Every day | ORAL | 7 refills | Status: DC

## 2023-11-14 NOTE — Telephone Encounter (Signed)
 For documentation purposes: The patient declined coming in for a hospital follow up appointment.

## 2023-11-14 NOTE — Telephone Encounter (Signed)
 I sent both the meds to [facility] [address]

## 2023-11-14 NOTE — Telephone Encounter (Signed)
 Patient was discharged from the hospital on Saturday (again). The hospitalist sent in two prescriptions. One for Pantoprazole  and one for Potassium. They sent it to [facility]. The patient wants to know if you will call both of those in for a 30 day supply to [facility] on [address] for her instead, because it is more convenient for her to pick up.

## 2023-11-20 MED FILL — Fosaprepitant Dimeglumine For IV Infusion 150 MG (Base Eq): INTRAVENOUS | Qty: 5 | Status: AC

## 2023-11-20 NOTE — Progress Notes (Unsigned)
Palliative Medicine [facility]  [phone] [phone]   Name: Rebecca Ochoa Date: 11/20/2023 MRN: [id number]  DOB: October 23, 1961  Patient Care Team: [name], MD as PCP - General (Internal Medicine) Wyline Mood Alben Spittle, MD as PCP - Cardiology (Cardiology) Hanley Seamen Dustin Folks, MD as Referring Physician (Optometry) Ilda Mori, MD as Consulting Physician (Obstetrics and Gynecology) Arminda Resides, MD as Consulting Physician (Dermatology) Lytle Butte, RN as Registered Nurse [name], MD as Referring Physician (Internal Medicine) Pickenpack-Cousar, Arty Baumgartner, NP as Nurse Practitioner (Hospice and Palliative Medicine)    INTERVAL HISTORY: Rebecca Ochoa is a 63 y.o. female with oncologic medical history including non-small cell lung cancer (08/2023) and innumerable hypermetabolic pulmonary metastatic lesions in addition to liver and bone metastasis as well as abdominal celiac axis lymphadenopathy.  Palliative ask to see for symptom management and goals of care.   SOCIAL HISTORY:     reports that she has quit smoking. Her smoking use included e-cigarettes. She has never used smokeless tobacco. She reports current alcohol use of about 3.0 standard drinks of alcohol per week. She reports that she does not use drugs.  ADVANCE DIRECTIVES:  None on file  CODE STATUS: Full code  PAST MEDICAL HISTORY: Past Medical History:  Diagnosis Date  . Anemia   . Asthma, mild intermittent, well-controlled   . GERD (gastroesophageal reflux disease)   . Hx of migraines   . Hyperlipidemia   . Hypertension   . Vitamin D deficiency     ALLERGIES:  is allergic to biaxin [clarithromycin].  MEDICATIONS:  Current Outpatient Medications  Medication Sig Dispense Refill  . acetaminophen (TYLENOL) 500 MG tablet Take 1,000 mg by mouth every 8 (eight) hours as needed for mild pain.    Marland Kitchen albuterol (PROVENTIL) (2.5 MG/3ML) 0.083% nebulizer solution Take 3  mLs (2.5 mg total) by nebulization every 4 (four) hours as needed for wheezing or shortness of breath. (Patient not taking: Reported on 11/09/2023) 75 mL 12  . ALPRAZolam (XANAX) 0.5 MG tablet Take 1 tablet (0.5 mg total) by mouth 3 (three) times daily as needed for anxiety. 60 tablet 0  . apixaban (ELIQUIS) 5 MG TABS tablet Take 1 tablet (5 mg total) by mouth 2 (two) times daily. 60 tablet 1  . cyanocobalamin (VITAMIN B12) 500 MCG tablet Take 1 tablet (500 mcg total) by mouth daily. 100 tablet 0  . folic acid (FOLVITE) 1 MG tablet Take 1 tablet (1 mg total) by mouth daily. 30 tablet 2  . HYDROcodone-acetaminophen (NORCO/VICODIN) 5-325 MG tablet Take 1 tablet by mouth every 6 (six) hours as needed. (Patient taking differently: Take 1 tablet by mouth every 6 (six) hours as needed (for pain).) 21 tablet 0  . loperamide (IMODIUM) 2 MG capsule Take 1 capsule (2 mg total) by mouth as needed for diarrhea or loose stools. 30 capsule 0  . ondansetron (ZOFRAN) 4 MG tablet Take 1 tablet (4 mg total) by mouth every 6 (six) hours as needed for nausea. 20 tablet 0  . ondansetron (ZOFRAN) 8 MG tablet Take 8 mg by mouth every 8 (eight) hours as needed for nausea or vomiting.    . pantoprazole (PROTONIX) 40 MG tablet Take 1 tablet (40 mg total) by mouth 2 (two) times daily. 60 tablet 0  . potassium chloride SA (KLOR-CON M) 20 MEQ tablet Take 1 tablet (20 mEq total) by mouth daily for 7 days. 7 tablet 7  . predniSONE (DELTASONE) 10 MG tablet Take 4 tabs  by mouth daily x 3 days, then 3 tabs daily x 3 days, then 2 tabs daily x 3 days, then 1 tab daily x 3 days then stop.  Discussed with oncology about stopping 30 tablet 0  . prochlorperazine (COMPAZINE) 10 MG tablet Take 10 mg by mouth every 6 (six) hours as needed for nausea or vomiting.    Marland Kitchen TAGRISSO 80 MG tablet Take 80 mg by mouth in the morning. Resume home med with Tagrisso every day.    . temazepam (RESTORIL) 30 MG capsule Take 1 capsule (30 mg total) by mouth at  bedtime as needed for sleep. 30 capsule 0   No current facility-administered medications for this visit.    VITAL SIGNS: There were no vitals taken for this visit. There were no vitals filed for this visit.  Estimated body mass index is 24.96 kg/m as calculated from the following:   Height as of 11/10/23: 5\' 5"  (1.651 m).   Weight as of 11/10/23: 150 lb (68 kg).   PERFORMANCE STATUS (ECOG) : 1 - Symptomatic but completely ambulatory   Physical Exam General: NAD Cardiovascular: regular rate and rhythm Pulmonary: clear ant fields Abdomen: soft, nontender, + bowel sounds Extremities: no edema, no joint deformities Skin: no rashes Neurological: AAO x3  Discussed the use of AI scribe software for clinical note transcription with the patient, who gave verbal consent to proceed.   IMPRESSION:  Rebecca Ochoa presents to clinic for follow-up. Continues to take things one day at a time. Denies nausea or vomiting. Remains challenged with ongoing diarrhea. She reported experiencing diarrhea, which was frequent, approximately four to five times a day, and described as very loose. The patient noted that her stomach was making noises throughout the day and felt 'angry.' She had been taking Imodium, up to four a day, to manage the diarrhea. At max she has taken up to 4 tablets daily. Advised she can take up to 8 tablets daily for loose stools. She is also concerned about becoming constipated. Knows to closely monitor. Additionally, the patient reported a raw area on her buttocks, which she attributed to frequent diarrhea. She had been using a cream provided by the hospital, which was not effective in resolving the issue. Recommended use of mild soap, water, and wipes. Also recommended use of skin barrier such as Desitin ointment for skin protection and healing. She verbalized understanding.   Her potassium is 2.5. She is aware will be receiving IV infusion of potassium in addition to home oral dosing.  She expressed some difficulty in taking large pills. Will plan to take with applesauce or pudding to better tolerate which worked in the past.   We discussed Rebecca Ochoa's ongoing insomnia. Husband reports some noticeable improvement with increase of Restoril to 30-mg however she is waking up within several hours. She is able to easily fall asleep. We discussed continuing for several more weeks and closely evaluating. Patient did try melatonin however no significant difference.   We will plan to continue close monitoring and support as needed.  I discussed the importance of continued conversation with family and their medical providers regarding overall plan of care and treatment options, ensuring decisions are within the context of the patients values and GOCs.  PLAN: Hypokalemia Potassium level at 2.5. Diarrhea likely contributing to low potassium levels. -Administer IV potassium today per Oncology. -Start oral potassium supplementation per Oncology.   Diarrhea Frequent loose stools, up to 4-5 times a day. Currently taking up to 4 Imodium a  day. -Increase Imodium as needed, up to 8 a day as advised.   Insomnia Difficulty maintaining sleep, despite taking Restoril 30mg . Melatonin ineffective. -Continue Restoril 30mg  for two more weeks. -Consider alternative sleep aid if no improvement in two weeks.  Perianal skin irritation Raw area on buttocks due to frequent diarrhea. -Apply Desitin cream after each bowel movement. -Use gentle wipes for cleaning after bowel movements.  Follow-up Unclear when next appointment is scheduled. -Schedule follow-up appointment on 11/20/2022 for monitoring of potassium levels and response to increased Imodium and Restoril.  Patient expressed understanding and was in agreement with this plan. She also understands that She can call the clinic at any time with any questions, concerns, or complaints.   Any controlled substances utilized were prescribed in the  context of palliative care. PDMP has been reviewed.   Visit consisted of counseling and education dealing with the complex and emotionally intense issues of symptom management and palliative care in the setting of serious and potentially life-threatening illness.  Willette Alma, AGPCNP-BC  Palliative Medicine Team/[facility]

## 2023-11-21 ENCOUNTER — Inpatient Hospital Stay (HOSPITAL_BASED_OUTPATIENT_CLINIC_OR_DEPARTMENT_OTHER): Payer: No Typology Code available for payment source | Admitting: Internal Medicine

## 2023-11-21 ENCOUNTER — Inpatient Hospital Stay: Payer: No Typology Code available for payment source | Attending: Internal Medicine

## 2023-11-21 ENCOUNTER — Inpatient Hospital Stay: Payer: No Typology Code available for payment source

## 2023-11-21 ENCOUNTER — Other Ambulatory Visit: Payer: No Typology Code available for payment source

## 2023-11-21 ENCOUNTER — Encounter: Payer: Self-pay | Admitting: Nurse Practitioner

## 2023-11-21 ENCOUNTER — Inpatient Hospital Stay: Payer: No Typology Code available for payment source | Attending: Internal Medicine | Admitting: Dietician

## 2023-11-21 ENCOUNTER — Inpatient Hospital Stay (HOSPITAL_BASED_OUTPATIENT_CLINIC_OR_DEPARTMENT_OTHER): Payer: No Typology Code available for payment source | Admitting: Nurse Practitioner

## 2023-11-21 VITALS — BP 125/80 | HR 137 | Temp 97.2°F | Resp 17 | Ht 65.0 in | Wt 143.5 lb

## 2023-11-21 VITALS — HR 122

## 2023-11-21 DIAGNOSIS — Z5111 Encounter for antineoplastic chemotherapy: Secondary | ICD-10-CM | POA: Insufficient documentation

## 2023-11-21 DIAGNOSIS — C3411 Malignant neoplasm of upper lobe, right bronchus or lung: Secondary | ICD-10-CM

## 2023-11-21 DIAGNOSIS — C3491 Malignant neoplasm of unspecified part of right bronchus or lung: Secondary | ICD-10-CM

## 2023-11-21 DIAGNOSIS — D6481 Anemia due to antineoplastic chemotherapy: Secondary | ICD-10-CM | POA: Diagnosis not present

## 2023-11-21 DIAGNOSIS — F17290 Nicotine dependence, other tobacco product, uncomplicated: Secondary | ICD-10-CM | POA: Insufficient documentation

## 2023-11-21 DIAGNOSIS — T50Z95A Adverse effect of other vaccines and biological substances, initial encounter: Secondary | ICD-10-CM | POA: Insufficient documentation

## 2023-11-21 DIAGNOSIS — C787 Secondary malignant neoplasm of liver and intrahepatic bile duct: Secondary | ICD-10-CM | POA: Diagnosis present

## 2023-11-21 DIAGNOSIS — R21 Rash and other nonspecific skin eruption: Secondary | ICD-10-CM | POA: Insufficient documentation

## 2023-11-21 DIAGNOSIS — C7951 Secondary malignant neoplasm of bone: Secondary | ICD-10-CM | POA: Insufficient documentation

## 2023-11-21 DIAGNOSIS — G4709 Other insomnia: Secondary | ICD-10-CM | POA: Diagnosis not present

## 2023-11-21 DIAGNOSIS — Z923 Personal history of irradiation: Secondary | ICD-10-CM | POA: Insufficient documentation

## 2023-11-21 DIAGNOSIS — R530 Neoplastic (malignant) related fatigue: Secondary | ICD-10-CM | POA: Diagnosis not present

## 2023-11-21 DIAGNOSIS — R000 Tachycardia, unspecified: Secondary | ICD-10-CM | POA: Insufficient documentation

## 2023-11-21 DIAGNOSIS — N898 Other specified noninflammatory disorders of vagina: Secondary | ICD-10-CM | POA: Insufficient documentation

## 2023-11-21 DIAGNOSIS — J358 Other chronic diseases of tonsils and adenoids: Secondary | ICD-10-CM

## 2023-11-21 DIAGNOSIS — Z515 Encounter for palliative care: Secondary | ICD-10-CM | POA: Diagnosis not present

## 2023-11-21 DIAGNOSIS — Z95828 Presence of other vascular implants and grafts: Secondary | ICD-10-CM

## 2023-11-21 LAB — CMP (CANCER CENTER ONLY)
ALT: 27 U/L (ref 0–44)
AST: 38 U/L (ref 15–41)
Albumin: 3.3 g/dL — ABNORMAL LOW (ref 3.5–5.0)
Alkaline Phosphatase: 144 U/L — ABNORMAL HIGH (ref 38–126)
Anion gap: 7 (ref 5–15)
BUN: 10 mg/dL (ref 8–23)
CO2: 30 mmol/L (ref 22–32)
Calcium: 9.1 mg/dL (ref 8.9–10.3)
Chloride: 102 mmol/L (ref 98–111)
Creatinine: 0.5 mg/dL (ref 0.44–1.00)
GFR, Estimated: >60 mL/min (ref >60)
Glucose, Bld: 120 mg/dL — ABNORMAL HIGH (ref 70–99)
Potassium: 3.3 mmol/L — ABNORMAL LOW (ref 3.5–5.1)
Sodium: 139 mmol/L (ref 135–145)
Total Bilirubin: 0.7 mg/dL (ref 0.0–1.2)
Total Protein: 6.3 g/dL — ABNORMAL LOW (ref 6.5–8.1)

## 2023-11-21 LAB — CBC WITH DIFFERENTIAL (CANCER CENTER ONLY)
Abs Immature Granulocytes: 0.08 10*3/uL — ABNORMAL HIGH (ref 0.00–0.07)
Basophils Absolute: 0 10*3/uL (ref 0.0–0.1)
Basophils Relative: 0 %
Eosinophils Absolute: 0.1 10*3/uL (ref 0.0–0.5)
Eosinophils Relative: 1 %
HCT: 31.8 % — ABNORMAL LOW (ref 36.0–46.0)
Hemoglobin: 9.9 g/dL — ABNORMAL LOW (ref 12.0–15.0)
Immature Granulocytes: 1 %
Lymphocytes Relative: 3 %
Lymphs Abs: 0.4 10*3/uL — ABNORMAL LOW (ref 0.7–4.0)
MCH: 27.9 pg (ref 26.0–34.0)
MCHC: 31.1 g/dL (ref 30.0–36.0)
MCV: 89.6 fL (ref 80.0–100.0)
Monocytes Absolute: 0.6 10*3/uL (ref 0.1–1.0)
Monocytes Relative: 5 %
Neutro Abs: 11.2 10*3/uL — ABNORMAL HIGH (ref 1.7–7.7)
Neutrophils Relative %: 90 %
Platelet Count: 208 10*3/uL (ref 150–400)
RBC: 3.55 MIL/uL — ABNORMAL LOW (ref 3.87–5.11)
RDW: 18.2 % — ABNORMAL HIGH (ref 11.5–15.5)
WBC Count: 12.3 10*3/uL — ABNORMAL HIGH (ref 4.0–10.5)
nRBC: 0 % (ref 0.0–0.2)

## 2023-11-21 MED ORDER — SODIUM CHLORIDE 0.9% FLUSH
10.0000 mL | Freq: Once | INTRAVENOUS | Status: AC
  Administered 2023-11-21: 10 mL

## 2023-11-21 MED ORDER — SODIUM CHLORIDE 0.9 % IV SOLN
500.0000 mg/m2 | Freq: Once | INTRAVENOUS | Status: AC
  Administered 2023-11-21: 900 mg via INTRAVENOUS
  Filled 2023-11-21: qty 20

## 2023-11-21 MED ORDER — PALONOSETRON HCL INJECTION 0.25 MG/5ML
0.2500 mg | Freq: Once | INTRAVENOUS | Status: AC
  Administered 2023-11-21: 0.25 mg via INTRAVENOUS
  Filled 2023-11-21: qty 5

## 2023-11-21 MED ORDER — DEXAMETHASONE SODIUM PHOSPHATE 10 MG/ML IJ SOLN
10.0000 mg | Freq: Once | INTRAMUSCULAR | Status: AC
  Administered 2023-11-21: 10 mg via INTRAVENOUS
  Filled 2023-11-21: qty 1

## 2023-11-21 MED ORDER — SODIUM CHLORIDE 0.9 % IV SOLN
500.0000 mg | Freq: Once | INTRAVENOUS | Status: AC
  Administered 2023-11-21: 500 mg via INTRAVENOUS
  Filled 2023-11-21: qty 50

## 2023-11-21 MED ORDER — SODIUM CHLORIDE 0.9 % IV SOLN
150.0000 mg | Freq: Once | INTRAVENOUS | Status: AC
  Administered 2023-11-21: 150 mg via INTRAVENOUS
  Filled 2023-11-21: qty 150
  Filled 2023-11-21 (×3): qty 5

## 2023-11-21 MED ORDER — CLINDAMYCIN PHOSPHATE 1 % EX GEL: Freq: Two times a day (BID) | CUTANEOUS | 0 refills | Status: DC

## 2023-11-21 MED ORDER — SODIUM CHLORIDE 0.9 % IV SOLN: INTRAVENOUS | Status: AC

## 2023-11-21 MED ORDER — SODIUM CHLORIDE 0.9 % IV SOLN: INTRAVENOUS | Status: DC

## 2023-11-21 MED ORDER — PREDNISONE 10 MG PO TABS: 5.0000 mg | ORAL_TABLET | Freq: Every day | ORAL | 1 refills | Status: DC

## 2023-11-21 MED ORDER — TEMAZEPAM 30 MG PO CAPS: 30.0000 mg | ORAL_CAPSULE | Freq: Every evening | ORAL | 0 refills | Status: DC | PRN

## 2023-11-21 NOTE — Progress Notes (Signed)
Nutrition Follow-up:   Pt with stage IV NSCLC (diagnosed 08/2023). S/p palliative radiation to large hilar and mediastinal mass under the care of Dr. Roselind Messier (final RT 11/4). She is currently receiving Tagrisso 80 mg/day. Patient starting concurrent systemic chemotherapy with carboplatin + alimta (first 1/21)  Met with patient and husband in infusion. Patient reports having a good appetite and eating well at home. She is disappointed with wt loss today. Patient ate bowl of cereal with whole milk for breakfast. Had ham/cheese sandwich with chips for lunch. She snacked on pack of nabs. Patient had a few bites of chicken pie. She did not like this and ate peanut butter/jelly sandwich instead. She is drinking ~36 ounces of water and one body armor. She does not like milk supplements. Patient denies nausea, vomiting, diarrhea, constipation.    Medications: reviewed   Labs: albumin 3.3, glucose 120, K 3.3  Anthropometrics: Wt 143 lb 8 oz today decreased 3% in 4 weeks - concerning  1/9 - 150 lb (hospital wt - stated) 12/31 - 147 lb 1.6 oz  12/18 - 150 lb 5.7 oz    NUTRITION DIAGNOSIS: Unintended wt loss continues   MALNUTRITION DIAGNOSIS: Severe malnutrition continues    INTERVENTION:  Offered suggestions for ways to add calories and protein to foods Encouraged high protein snacks in between meals Encouraged pt to try CIB powder mixed with whole milk Support and encouragement    MONITORING, EVALUATION, GOAL: wt trends, intake   NEXT VISIT: Tuesday February 11 in infusion

## 2023-11-21 NOTE — Progress Notes (Signed)
[facility] [phone]   [phone]  OFFICE PROGRESS NOTE  Rebecca Cowboy, MD [address] [place] [address]  DIAGNOSIS: Stage IVB ( T3, N2, M1c) non-Small Cell Lung Cancer, poorly differentiated squamous cell carcinoma presented with large right upper lobe lung mass in addition to large necrotic right hilar/mediastinal mass and innumerable hypermetabolic pulmonary metastatic lesions in addition to liver and bone metastasis as well as abdominal celiac axis lymphadenopathy diagnosed in October 2024.    PDL1: 0%   Molecular Studies: Positive for EGFR G719A   PRIOR THERAPY: Palliative radiotherapy to the large hilar and mediastinal mass under the care of Dr. Roselind Messier last dose on 09/04/23   CURRENT THERAPY: Tagrisso 80 mg p.o. daily.  She will start concurrent systemic chemotherapy with carboplatin for AUC of 5 and Alimta 500 Mg/M2 on November 14, 2023.  INTERVAL HISTORY: Rebecca Ochoa 63 y.o. female returns to the clinic today for follow-up visit accompanied by her brother Freida Busman.Discussed the use of AI scribe software for clinical note transcription with the patient, who gave verbal consent to proceed.  History of Present Illness   The patient, a 63 year old individual with stage 4 non-small cell lung cancer (NSCLC) with a positive EGFR mutation (G719A), presents for follow-up. She was initially treated with radiation for right hilar and mediastinal lymph node involvement due to tumor obstruction of the lung. Subsequently, she was started on Tagrisso 80mg  daily, which was initiated in late November of 2024.  Since starting Tagrisso, the patient reports tolerating the medication well, with occasional diarrhea managed with Imodium. She describes feeling weak but otherwise well, with limited physical activity due to lack of strength. She has noticed some weight loss over the past few weeks.  She also reports a persistent scab on the  scalp that is painful when touched.  The patient has been on Eliquis, and she has noticed daily nasal bleeding with blood clots. She has been managing this with saline nasal sprays and has noticed no significant improvement.  The patient also reports a desire to remain on a low dose of steroids, as she has noticed a decrease in appetite and fluid intake when not on steroids. She has been taking prednisone 10mg  daily.  The patient has expressed concerns about returning to work and maintaining her insurance coverage. She has been in contact with her employer about applying for social security disability.       MEDICAL HISTORY: Past Medical History:  Diagnosis Date   Anemia    Asthma, mild intermittent, well-controlled    GERD (gastroesophageal reflux disease)    Hx of migraines    Hyperlipidemia    Hypertension    Vitamin D deficiency     ALLERGIES:  is allergic to biaxin [clarithromycin].  MEDICATIONS:  Current Outpatient Medications  Medication Sig Dispense Refill   acetaminophen (TYLENOL) 500 MG tablet Take 1,000 mg by mouth every 8 (eight) hours as needed for mild pain.     albuterol (PROVENTIL) (2.5 MG/3ML) 0.083% nebulizer solution Take 3 mLs (2.5 mg total) by nebulization every 4 (four) hours as needed for wheezing or shortness of breath. (Patient not taking: Reported on 11/09/2023) 75 mL 12   ALPRAZolam (XANAX) 0.5 MG tablet Take 1 tablet (0.5 mg total) by mouth 3 (three) times daily as needed for anxiety. 60 tablet 0   apixaban (ELIQUIS) 5 MG TABS tablet Take 1 tablet (5 mg total) by mouth 2 (two) times daily. 60 tablet 1  cyanocobalamin (VITAMIN B12) 500 MCG tablet Take 1 tablet (500 mcg total) by mouth daily. 100 tablet 0   folic acid (FOLVITE) 1 MG tablet Take 1 tablet (1 mg total) by mouth daily. 30 tablet 2   HYDROcodone-acetaminophen (NORCO/VICODIN) 5-325 MG tablet Take 1 tablet by mouth every 6 (six) hours as needed. (Patient taking differently: Take 1 tablet by mouth  every 6 (six) hours as needed (for pain).) 21 tablet 0   loperamide (IMODIUM) 2 MG capsule Take 1 capsule (2 mg total) by mouth as needed for diarrhea or loose stools. 30 capsule 0   ondansetron (ZOFRAN) 4 MG tablet Take 1 tablet (4 mg total) by mouth every 6 (six) hours as needed for nausea. 20 tablet 0   ondansetron (ZOFRAN) 8 MG tablet Take 8 mg by mouth every 8 (eight) hours as needed for nausea or vomiting.     pantoprazole (PROTONIX) 40 MG tablet Take 1 tablet (40 mg total) by mouth 2 (two) times daily. 60 tablet 0   potassium chloride SA (KLOR-CON M) 20 MEQ tablet Take 1 tablet (20 mEq total) by mouth daily for 7 days. 7 tablet 7   predniSONE (DELTASONE) 10 MG tablet Take 4 tabs by mouth daily x 3 days, then 3 tabs daily x 3 days, then 2 tabs daily x 3 days, then 1 tab daily x 3 days then stop.  Discussed with oncology about stopping 30 tablet 0   prochlorperazine (COMPAZINE) 10 MG tablet Take 10 mg by mouth every 6 (six) hours as needed for nausea or vomiting.     TAGRISSO 80 MG tablet Take 80 mg by mouth in the morning. Resume home med with Tagrisso every day.     temazepam (RESTORIL) 30 MG capsule Take 1 capsule (30 mg total) by mouth at bedtime as needed for sleep. 30 capsule 0   No current facility-administered medications for this visit.    SURGICAL HISTORY:  Past Surgical History:  Procedure Laterality Date   BREAST SURGERY     reduction   BRONCHIAL BIOPSY  08/08/2023   Procedure: BRONCHIAL BIOPSIES;  Surgeon: Leslye Peer, MD;  Location[facility] ENDOSCOPY;  Service: Pulmonary;;   BRONCHIAL BRUSHINGS  08/08/2023   Procedure: BRONCHIAL BRUSHINGS;  Surgeon: Leslye Peer, MD;  Location: [facility] ENDOSCOPY;  Service: Pulmonary;;   BRONCHIAL NEEDLE ASPIRATION BIOPSY  08/08/2023   Procedure: BRONCHIAL NEEDLE ASPIRATION BIOPSIES;  Surgeon: Leslye Peer, MD;  Location: [facility] ENDOSCOPY;  Service: Pulmonary;;   ENDOBRONCHIAL ULTRASOUND Bilateral 08/08/2023   Procedure: ENDOBRONCHIAL ULTRASOUND;   Surgeon: Leslye Peer, MD;  Location: [facility] ENDOSCOPY;  Service: Pulmonary;  Laterality: Bilateral;   HEMOSTASIS CONTROL  08/08/2023   Procedure: HEMOSTASIS CONTROL;  Surgeon: Leslye Peer, MD;  Location: [facility] ENDOSCOPY;  Service: Pulmonary;;   IR IMAGING GUIDED PORT INSERTION  09/01/2023   REDUCTION MAMMAPLASTY     TUBAL LIGATION      REVIEW OF SYSTEMS:  Constitutional: positive for anorexia, fatigue, and weight loss Eyes: negative Ears, nose, mouth, throat, and face: negative Respiratory: positive for dyspnea on exertion Cardiovascular: negative Gastrointestinal: positive for constipation Genitourinary:negative Integument/breast: negative Hematologic/lymphatic: negative Musculoskeletal:positive for muscle weakness Neurological: negative Behavioral/Psych: negative Endocrine: negative Allergic/Immunologic: negative   PHYSICAL EXAMINATION: General appearance: alert, cooperative, fatigued, and no distress Head: Normocephalic, without obvious abnormality, atraumatic Neck: no adenopathy, no JVD, supple, symmetrical, trachea midline, and thyroid not enlarged, symmetric, no tenderness/mass/nodules Lymph nodes: Cervical, supraclavicular, and axillary nodes normal. Resp: clear to auscultation bilaterally Back: symmetric, no  curvature. ROM normal. No CVA tenderness. Cardio: regular rate and rhythm, S1, S2 normal, no murmur, click, rub or gallop GI: soft, non-tender; bowel sounds normal; no masses,  no organomegaly Extremities: extremities normal, atraumatic, no cyanosis or edema Neurologic: Alert and oriented X 3, normal strength and tone. Normal symmetric reflexes. Normal coordination and gait  ECOG PERFORMANCE STATUS: 1 - Symptomatic but completely ambulatory  Blood pressure 125/80, pulse (!) 137, temperature (!) 97.2 F (36.2 C), temperature source Temporal, resp. rate 17, height 5\' 5"  (1.651 m), weight 143 lb 8 oz (65.1 kg), SpO2 98%.  LABORATORY DATA: Lab Results  Component Value  Date   WBC 12.3 (H) 11/21/2023   HGB 9.9 (L) 11/21/2023   HCT 31.8 (L) 11/21/2023   MCV 89.6 11/21/2023   PLT 208 11/21/2023      Chemistry      Component Value Date/Time   NA 134 (L) 11/10/2023 0620   K 3.2 (L) 11/10/2023 0620   CL 107 11/10/2023 0620   CO2 22 11/10/2023 0620   BUN 6 (L) 11/10/2023 0620   CREATININE 0.31 (L) 11/10/2023 0620   CREATININE 0.49 10/31/2023 1318   CREATININE 1.05 10/12/2022 1442      Component Value Date/Time   CALCIUM 7.7 (L) 11/10/2023 0620   ALKPHOS 99 11/09/2023 1830   AST 22 11/09/2023 1830   AST 31 10/31/2023 1318   ALT 19 11/09/2023 1830   ALT 52 (H) 10/31/2023 1318   BILITOT 1.6 (H) 11/09/2023 1830   BILITOT 0.8 10/31/2023 1318       RADIOGRAPHIC STUDIES: CT Angio Chest PE W and/or Wo Contrast Result Date: 11/09/2023 CLINICAL DATA:  History of non-small-cell lung cancer with weakness, tachycardia, hypotension, generalized weakness, shortness of breath, and abdominal pain. * Tracking Code: BO * EXAM: CT ANGIOGRAPHY CHEST CT ABDOMEN AND PELVIS WITH CONTRAST TECHNIQUE: Multidetector CT imaging of the chest was performed using the standard protocol during bolus administration of intravenous contrast. Multiplanar CT image reconstructions and MIPs were obtained to evaluate the vascular anatomy. Multidetector CT imaging of the abdomen and pelvis was performed using the standard protocol during bolus administration of intravenous contrast. RADIATION DOSE REDUCTION: This exam was performed according to the departmental dose-optimization program which includes automated exposure control, adjustment of the mA and/or kV according to patient size and/or use of iterative reconstruction technique. CONTRAST:  OMNIPAQUE IOHEXOL 350 MG/ML SOLN COMPARISON:  Same day chest radiograph, CTA chest dated 10/17/2023, and multiple priors including nuclear medicine PET-CT dated 08/11/2023 FINDINGS: CTA CHEST FINDINGS Cardiovascular: Left chest wall port tip  terminates in the right atrium. The study is high quality for the evaluation of pulmonary embolism. There are no filling defects in the central, lobar, segmental or subsegmental pulmonary artery branches to suggest acute pulmonary embolism. Similar marked effacement of right upper lobe pulmonary arteries. Great vessels are normal in course and caliber. Normal heart size. No significant pericardial fluid/thickening. Coronary artery calcifications. Mediastinum/Nodes: 1.5 cm right thyroid nodule (4:20), slowly increasing in size since 09/04/2023 when it was subtly seen and measured 5 mm. Normal esophagus. Right hilar mass as below. 11 mm left hilar lymph node (4:62), previously 4 mm. Lungs/Pleura: The central airways are patent. Apical predominant paraseptal emphysema. Multifocal bilateral masses and pulmonary nodules, many of which demonstrate cavitary changes. Interval slight decrease in size of dominant right upper lobe mass measuring 4.1 x 3.0 cm (12:32), previously 4.5 x 3.2 cm. A few nodules are increased in size, for example 1.5 x 1.0 cm right  lower lobe (12:49), previously 1.2 x 0.9 cm. Others are unchanged, for example right hilar mass encasing the right pulmonary vessels and bronchi measuring 5.4 x 5.2 cm (12:54). Interval cavitation of a few nodules, for example left upper lobe (12:49) and right lower lobe (12:60). No pneumothorax. Interval resolution of left pleural effusion. Musculoskeletal: No acute or abnormal lytic or blastic osseous lesions. Review of the MIP images confirms the above findings. CT ABDOMEN and PELVIS FINDINGS Hepatobiliary: Multifocal, lobulated centrally necrotic masses are not substantially changed in size from 10/17/2023 but increased in size from 08/11/2023, for example 8.3 x 4.5 cm segment 2/3 (2:25), previously 6.0 x 3.3 cm on 08/11/2023. innumerable subcentimeter hypodensities throughout the liver are new from 08/11/2023. No intra or extrahepatic biliary ductal dilation. Normal  gallbladder. Pancreas: No focal lesions or main ductal dilation. Spleen: Unchanged multifocal hypoattenuating lesions. Adrenals/Urinary Tract: No adrenal nodules. Multifocal bilateral renal hypodensities, which do not measure simple fluid attenuation, and a few of which appear slightly increased in size, for example 1.8 cm right interpolar (2:32), previously 1.2 cm on 08/14/2023. No hydronephrosis or calculi. No focal bladder wall thickening. Stomach/Bowel: Normal appearance of the stomach. No evidence of bowel wall thickening, distention, or inflammatory changes. Colonic diverticulosis without acute diverticulitis. Normal appendix. Vascular/Lymphatic: Aortic atherosclerosis. Unchanged size of necrotic periportal node measuring 2.1 cm (2:32) and portacaval node measuring 2.2 cm (2:30). ( Reproductive: No adnexal masses. Other: No free fluid, fluid collection, or free air. Musculoskeletal: No acute or abnormal lytic or blastic osseous lesions to correspond to sites of prior radiotracer uptake. Multilevel degenerative changes of the lumbar spine. IMPRESSION: CTA CHEST: 1. No evidence of pulmonary embolism. 2. Multifocal bilateral masses and pulmonary nodules, some of which are slightly decreased in size and others are increased in size, consistent with mixed response to therapy. 3. Interval resolution of left pleural effusion. 4. Interval enlargement of left hilar lymphadenopathy, likely metastasis. 5. Slowly enlarging 1.5 cm right thyroid nodule, suspicious for metastasis. CT ABDOMEN AND PELVIS: 1. No acute abnormality in the abdomen or pelvis. 2. Multifocal, lobulated centrally necrotic hepatic masses are not substantially changed in size from 10/17/2023 but increased in size from PET dated 08/11/2023. Innumerable subcentimeter hypodensities throughout the liver are new from 08/11/2023 and suspicious for metastatic disease. 3. Multifocal bilateral renal hypodensities, which do not measure simple fluid attenuation,  and a few of which appear slightly increased in size from 08/14/2023, suspicious for metastatic disease. 4. Unchanged size of necrotic periportal and portacaval lymph nodes compared to 10/17/2023. 5. Unchanged splenic hypodensities, indeterminate. Aortic Atherosclerosis (ICD10-I70.0) and Emphysema (ICD10-J43.9). Electronically Signed   By: Agustin Cree M.D.   On: 11/09/2023 22:13   CT ABDOMEN PELVIS W CONTRAST Result Date: 11/09/2023 CLINICAL DATA:  History of non-small-cell lung cancer with weakness, tachycardia, hypotension, generalized weakness, shortness of breath, and abdominal pain. * Tracking Code: BO * EXAM: CT ANGIOGRAPHY CHEST CT ABDOMEN AND PELVIS WITH CONTRAST TECHNIQUE: Multidetector CT imaging of the chest was performed using the standard protocol during bolus administration of intravenous contrast. Multiplanar CT image reconstructions and MIPs were obtained to evaluate the vascular anatomy. Multidetector CT imaging of the abdomen and pelvis was performed using the standard protocol during bolus administration of intravenous contrast. RADIATION DOSE REDUCTION: This exam was performed according to the departmental dose-optimization program which includes automated exposure control, adjustment of the mA and/or kV according to patient size and/or use of iterative reconstruction technique. CONTRAST:  OMNIPAQUE IOHEXOL 350 MG/ML SOLN COMPARISON:  Same day  chest radiograph, CTA chest dated 10/17/2023, and multiple priors including nuclear medicine PET-CT dated 08/11/2023 FINDINGS: CTA CHEST FINDINGS Cardiovascular: Left chest wall port tip terminates in the right atrium. The study is high quality for the evaluation of pulmonary embolism. There are no filling defects in the central, lobar, segmental or subsegmental pulmonary artery branches to suggest acute pulmonary embolism. Similar marked effacement of right upper lobe pulmonary arteries. Great vessels are normal in course and caliber. Normal heart  size. No significant pericardial fluid/thickening. Coronary artery calcifications. Mediastinum/Nodes: 1.5 cm right thyroid nodule (4:20), slowly increasing in size since 09/04/2023 when it was subtly seen and measured 5 mm. Normal esophagus. Right hilar mass as below. 11 mm left hilar lymph node (4:62), previously 4 mm. Lungs/Pleura: The central airways are patent. Apical predominant paraseptal emphysema. Multifocal bilateral masses and pulmonary nodules, many of which demonstrate cavitary changes. Interval slight decrease in size of dominant right upper lobe mass measuring 4.1 x 3.0 cm (12:32), previously 4.5 x 3.2 cm. A few nodules are increased in size, for example 1.5 x 1.0 cm right lower lobe (12:49), previously 1.2 x 0.9 cm. Others are unchanged, for example right hilar mass encasing the right pulmonary vessels and bronchi measuring 5.4 x 5.2 cm (12:54). Interval cavitation of a few nodules, for example left upper lobe (12:49) and right lower lobe (12:60). No pneumothorax. Interval resolution of left pleural effusion. Musculoskeletal: No acute or abnormal lytic or blastic osseous lesions. Review of the MIP images confirms the above findings. CT ABDOMEN and PELVIS FINDINGS Hepatobiliary: Multifocal, lobulated centrally necrotic masses are not substantially changed in size from 10/17/2023 but increased in size from 08/11/2023, for example 8.3 x 4.5 cm segment 2/3 (2:25), previously 6.0 x 3.3 cm on 08/11/2023. innumerable subcentimeter hypodensities throughout the liver are new from 08/11/2023. No intra or extrahepatic biliary ductal dilation. Normal gallbladder. Pancreas: No focal lesions or main ductal dilation. Spleen: Unchanged multifocal hypoattenuating lesions. Adrenals/Urinary Tract: No adrenal nodules. Multifocal bilateral renal hypodensities, which do not measure simple fluid attenuation, and a few of which appear slightly increased in size, for example 1.8 cm right interpolar (2:32), previously 1.2 cm  on 08/14/2023. No hydronephrosis or calculi. No focal bladder wall thickening. Stomach/Bowel: Normal appearance of the stomach. No evidence of bowel wall thickening, distention, or inflammatory changes. Colonic diverticulosis without acute diverticulitis. Normal appendix. Vascular/Lymphatic: Aortic atherosclerosis. Unchanged size of necrotic periportal node measuring 2.1 cm (2:32) and portacaval node measuring 2.2 cm (2:30). ( Reproductive: No adnexal masses. Other: No free fluid, fluid collection, or free air. Musculoskeletal: No acute or abnormal lytic or blastic osseous lesions to correspond to sites of prior radiotracer uptake. Multilevel degenerative changes of the lumbar spine. IMPRESSION: CTA CHEST: 1. No evidence of pulmonary embolism. 2. Multifocal bilateral masses and pulmonary nodules, some of which are slightly decreased in size and others are increased in size, consistent with mixed response to therapy. 3. Interval resolution of left pleural effusion. 4. Interval enlargement of left hilar lymphadenopathy, likely metastasis. 5. Slowly enlarging 1.5 cm right thyroid nodule, suspicious for metastasis. CT ABDOMEN AND PELVIS: 1. No acute abnormality in the abdomen or pelvis. 2. Multifocal, lobulated centrally necrotic hepatic masses are not substantially changed in size from 10/17/2023 but increased in size from PET dated 08/11/2023. Innumerable subcentimeter hypodensities throughout the liver are new from 08/11/2023 and suspicious for metastatic disease. 3. Multifocal bilateral renal hypodensities, which do not measure simple fluid attenuation, and a few of which appear slightly increased in size from 08/14/2023,  suspicious for metastatic disease. 4. Unchanged size of necrotic periportal and portacaval lymph nodes compared to 10/17/2023. 5. Unchanged splenic hypodensities, indeterminate. Aortic Atherosclerosis (ICD10-I70.0) and Emphysema (ICD10-J43.9). Electronically Signed   By: Agustin Cree M.D.   On:  11/09/2023 22:13   DG Chest 2 View Result Date: 11/09/2023 CLINICAL DATA:  Shortness of breath. Back pain. Weakness. Lung cancer. EXAM: CHEST - 2 VIEW COMPARISON:  Chest radiograph dated 10/20/2023. FINDINGS: Left-sided Port-A-Cath in similar position. Bilateral pulmonary nodules consistent with metastatic disease. Similar appearance of masslike opacity in the right upper lobe. No new consolidation. No significant pleural effusion. No pneumothorax. Stable cardiac silhouette. No acute osseous pathology. IMPRESSION: Bilateral pulmonary metastatic disease.  No new findings. Electronically Signed   By: Elgie Collard M.D.   On: 11/09/2023 16:16    ASSESSMENT AND PLAN: This is a very pleasant 63 years old white female with Stage IVB ( T3, N2, M1c) non-Small Cell Lung Cancer, poorly differentiated squamous cell carcinoma presented with large right upper lobe lung mass in addition to large necrotic right hilar/mediastinal mass and innumerable hypermetabolic pulmonary metastatic lesions in addition to liver and bone metastasis as well as abdominal celiac axis lymphadenopathy diagnosed in October 2024.  Molecular studies showed negative PD-L1 expression and positive EGFR mutation G719A She underwent palliative radiotherapy to the large hilar and mediastinal mass under the care of Dr. Roselind Messier last dose on 09/04/23 She is currently on treatment with Tagrisso 80 mg p.o. daily started on September 29, 2023.  The patient has been tolerating this treatment fairly well except for the few episodes of diarrhea. She will start concurrent chemotherapy with carboplatin for AUC of 5 and Alimta 500 Mg/M2 today with Tagrisso 80 mg p.o. daily.    Stage IV Non-Small Cell Lung Cancer (NSCLC) with EGFR Mutation Diagnosed in October 2024 with stage IV NSCLC, positive for EGFR mutation G719A. Initial treatment included radiation to the right hilar and mediastinal lymph nodes due to tumor obstruction. Started on Tagrisso (osimertinib)  80 mg daily since late November 2024. Patient reports handling the medication well with occasional diarrhea managed with Imodium. Recent scan shows mixed response with some areas improving and others worsening. Due to disease progression and current weakness, decision made to add carboplatin and pemetrexed (Alimta) to the treatment regimen. Discussed the risks of chemotherapy including nausea, fatigue, and potential for further weakness. Explained the benefits of potentially controlling disease progression and improving symptoms. Patient expressed understanding and consented to the treatment plan. - Administer carboplatin and pemetrexed (Alimta) - Monitor response to chemotherapy - Check labs weekly during chemotherapy - Ensure daily intake of folic acid - Administer B12 injections as needed - Monitor potassium levels and provide supplements as needed - Encourage dietary intake of potassium-rich foods - Prescribe prednisone 10 mg daily to manage appetite and energy levels - Provide antibiotic lotion for scalp lesion - Recommend dandruff shampoo for scalp care - Check EKG due to elevated heart rate - Provide additional fluids if needed during chemotherapy  Epistaxis Reports daily blood clots in nasal passages, likely due to dryness and use of blood thinners (Eliquis). - Use saline nasal spray regularly to maintain moisture  Nutritional Deficiency Needs to increase nutritional intake to prevent further weight loss. Discussed the importance of dietary supplements and meeting with a dietician for guidance. - Increase food intake with supplements like Ensure or Boost - Follow up with dietician for nutritional guidance  Follow-up - Schedule follow-up visit next week to assess response to chemotherapy and  address any issues.    For the dehydration, I will arrange for the patient to receive 1 L of normal saline in the clinic today. The patient was advised to call immediately if she has any  concerning symptoms in the interval.  The patient voices understanding of current disease status and treatment options and is in agreement with the current care plan.  All questions were answered. The patient knows to call the clinic with any problems, questions or concerns. We can certainly see the patient much sooner if necessary. The total time spent in the appointment was 30 minutes.  Disclaimer: This note was dictated with voice recognition software. Similar sounding words can inadvertently be transcribed and may not be corrected upon review.

## 2023-11-21 NOTE — Progress Notes (Signed)
Ok to adjust [place] to 500mg  (AUC=5) per Dr Arbutus Ped

## 2023-11-21 NOTE — Patient Instructions (Signed)
[facility] - A DEPT OF [facility]  Discharge Instructions: Thank you for choosing [facility] to provide your oncology and hematology care.   If you have a lab appointment with the Cancer Center, please go directly to the Cancer Center and check in at the registration area.   Wear comfortable clothing and clothing appropriate for easy access to any Portacath or PICC line.   We strive to give you quality time with your provider. You may need to reschedule your appointment if you arrive late (15 or more minutes).  Arriving late affects you and other patients whose appointments are after yours.  Also, if you miss three or more appointments without notifying the office, you may be dismissed from the clinic at the provider's discretion.      For prescription refill requests, have your pharmacy contact our office and allow 72 hours for refills to be completed.    Today you received the following chemotherapy and/or immunotherapy agents, pemetrexed, carboplatin      To help prevent nausea and vomiting after your treatment, we encourage you to take your nausea medication as directed.  BELOW ARE SYMPTOMS THAT SHOULD BE REPORTED IMMEDIATELY: *FEVER GREATER THAN 100.4 F (38 C) OR HIGHER *CHILLS OR SWEATING *NAUSEA AND VOMITING THAT IS NOT CONTROLLED WITH YOUR NAUSEA MEDICATION *UNUSUAL SHORTNESS OF BREATH *UNUSUAL BRUISING OR BLEEDING *URINARY PROBLEMS (pain or burning when urinating, or frequent urination) *BOWEL PROBLEMS (unusual diarrhea, constipation, pain near the anus) TENDERNESS IN MOUTH AND THROAT WITH OR WITHOUT PRESENCE OF ULCERS (sore throat, sores in mouth, or a toothache) UNUSUAL RASH, SWELLING OR PAIN  UNUSUAL VAGINAL DISCHARGE OR ITCHING   Items with * indicate a potential emergency and should be followed up as soon as possible or go to the Emergency Department if any problems should occur.  Please show the CHEMOTHERAPY ALERT CARD or  IMMUNOTHERAPY ALERT CARD at check-in to the Emergency Department and triage nurse.  Should you have questions after your visit or need to cancel or reschedule your appointment, please contact [facility] - A DEPT OF [facility]  Dept: [phone]  and follow the prompts.  Office hours are 8:00 a.m. to 4:30 p.m. Monday - Friday. Please note that voicemails left after 4:00 p.m. may not be returned until the following business day.  We are closed weekends and major holidays. You have access to a nurse at all times for urgent questions. Please call the main number to the clinic Dept: [phone] and follow the prompts.   For any non-urgent questions, you may also contact your provider using MyChart. We now offer e-Visits for anyone 12 and older to request care online for non-urgent symptoms. For details visit [email or web address].   Also download the MyChart app! Go to the app store, search "MyChart", open the app, select [place], and log in with your MyChart username and password.

## 2023-11-22 ENCOUNTER — Telehealth: Payer: Self-pay

## 2023-11-22 NOTE — Telephone Encounter (Signed)
Ms Texter states that she is doing fine. She is eating, drinking, and urinating well. She knows to call the office at [phone] if  she has any questions or concerns.

## 2023-11-22 NOTE — Telephone Encounter (Signed)
-----   Message from Nurse Barbara Cower E sent at 11/21/2023  1:00 PM EST ----- Regarding: first time treatment call back-alimta/carbo-mohamed Patient received treatment for the first time today. She is followed by Dr. Arbutus Ped. She received carbo/ alimta with no issues.:)

## 2023-11-23 ENCOUNTER — Other Ambulatory Visit: Payer: Self-pay

## 2023-11-23 ENCOUNTER — Other Ambulatory Visit: Payer: Self-pay | Admitting: Nurse Practitioner

## 2023-11-23 DIAGNOSIS — F419 Anxiety disorder, unspecified: Secondary | ICD-10-CM

## 2023-11-23 MED ORDER — POTASSIUM CHLORIDE CRYS ER 20 MEQ PO TBCR: 20.0000 meq | EXTENDED_RELEASE_TABLET | Freq: Every day | ORAL | 7 refills | Status: DC

## 2023-11-27 ENCOUNTER — Other Ambulatory Visit: Payer: Self-pay | Admitting: Internal Medicine

## 2023-11-27 ENCOUNTER — Other Ambulatory Visit: Payer: Self-pay | Admitting: *Deleted

## 2023-11-27 ENCOUNTER — Encounter: Payer: No Typology Code available for payment source | Admitting: Physician Assistant

## 2023-11-27 ENCOUNTER — Inpatient Hospital Stay: Payer: No Typology Code available for payment source

## 2023-11-27 ENCOUNTER — Other Ambulatory Visit: Payer: Self-pay | Admitting: Physician Assistant

## 2023-11-27 ENCOUNTER — Telehealth: Payer: Self-pay | Admitting: Medical Oncology

## 2023-11-27 ENCOUNTER — Inpatient Hospital Stay (HOSPITAL_BASED_OUTPATIENT_CLINIC_OR_DEPARTMENT_OTHER): Payer: No Typology Code available for payment source | Admitting: Physician Assistant

## 2023-11-27 VITALS — BP 107/75 | HR 109 | Temp 97.2°F | Resp 18 | Wt 140.2 lb

## 2023-11-27 DIAGNOSIS — R21 Rash and other nonspecific skin eruption: Secondary | ICD-10-CM | POA: Diagnosis not present

## 2023-11-27 DIAGNOSIS — Z95828 Presence of other vascular implants and grafts: Secondary | ICD-10-CM

## 2023-11-27 DIAGNOSIS — C3491 Malignant neoplasm of unspecified part of right bronchus or lung: Secondary | ICD-10-CM | POA: Diagnosis not present

## 2023-11-27 DIAGNOSIS — D6481 Anemia due to antineoplastic chemotherapy: Secondary | ICD-10-CM

## 2023-11-27 DIAGNOSIS — R000 Tachycardia, unspecified: Secondary | ICD-10-CM

## 2023-11-27 DIAGNOSIS — R530 Neoplastic (malignant) related fatigue: Secondary | ICD-10-CM

## 2023-11-27 DIAGNOSIS — T451X5A Adverse effect of antineoplastic and immunosuppressive drugs, initial encounter: Secondary | ICD-10-CM

## 2023-11-27 DIAGNOSIS — Z5111 Encounter for antineoplastic chemotherapy: Secondary | ICD-10-CM | POA: Diagnosis not present

## 2023-11-27 DIAGNOSIS — C3411 Malignant neoplasm of upper lobe, right bronchus or lung: Secondary | ICD-10-CM

## 2023-11-27 LAB — CMP (CANCER CENTER ONLY)
ALT: 24 U/L (ref 0–44)
ALT: 21 U/L (ref 0–44)
AST: 33 U/L (ref 15–41)
AST: 28 U/L (ref 15–41)
Albumin: 3.3 g/dL — ABNORMAL LOW (ref 3.5–5.0)
Albumin: 3.1 g/dL — ABNORMAL LOW (ref 3.5–5.0)
Alkaline Phosphatase: 127 U/L — ABNORMAL HIGH (ref 38–126)
Alkaline Phosphatase: 115 U/L (ref 38–126)
Anion gap: 5 (ref 5–15)
Anion gap: 3 — ABNORMAL LOW (ref 5–15)
BUN: 8 mg/dL (ref 8–23)
BUN: 8 mg/dL (ref 8–23)
CO2: 30 mmol/L (ref 22–32)
CO2: 31 mmol/L (ref 22–32)
Calcium: 9.1 mg/dL (ref 8.9–10.3)
Calcium: 8.8 mg/dL — ABNORMAL LOW (ref 8.9–10.3)
Chloride: 100 mmol/L (ref 98–111)
Chloride: 101 mmol/L (ref 98–111)
Creatinine: 0.56 mg/dL (ref 0.44–1.00)
Creatinine: 0.49 mg/dL (ref 0.44–1.00)
GFR, Estimated: >60 mL/min (ref >60)
GFR, Estimated: >60 mL/min (ref >60)
Glucose, Bld: 146 mg/dL — ABNORMAL HIGH (ref 70–99)
Glucose, Bld: 106 mg/dL — ABNORMAL HIGH (ref 70–99)
Potassium: 4.1 mmol/L (ref 3.5–5.1)
Potassium: 3.9 mmol/L (ref 3.5–5.1)
Sodium: 135 mmol/L (ref 135–145)
Sodium: 135 mmol/L (ref 135–145)
Total Bilirubin: 0.6 mg/dL (ref 0.0–1.2)
Total Bilirubin: 0.5 mg/dL (ref 0.0–1.2)
Total Protein: 6.4 g/dL — ABNORMAL LOW (ref 6.5–8.1)
Total Protein: 5.9 g/dL — ABNORMAL LOW (ref 6.5–8.1)

## 2023-11-27 LAB — CBC WITH DIFFERENTIAL (CANCER CENTER ONLY)
Abs Immature Granulocytes: 0.01 10*3/uL (ref 0.00–0.07)
Abs Immature Granulocytes: 0.01 10*3/uL (ref 0.00–0.07)
Basophils Absolute: 0 10*3/uL (ref 0.0–0.1)
Basophils Absolute: 0 10*3/uL (ref 0.0–0.1)
Basophils Relative: 0 %
Basophils Relative: 0 %
Eosinophils Absolute: 0 10*3/uL (ref 0.0–0.5)
Eosinophils Absolute: 0 10*3/uL (ref 0.0–0.5)
Eosinophils Relative: 1 %
Eosinophils Relative: 2 %
HCT: 26.4 % — ABNORMAL LOW (ref 36.0–46.0)
HCT: 23.1 % — ABNORMAL LOW (ref 36.0–46.0)
Hemoglobin: 8.1 g/dL — ABNORMAL LOW (ref 12.0–15.0)
Hemoglobin: 7.2 g/dL — ABNORMAL LOW (ref 12.0–15.0)
Immature Granulocytes: 0 %
Immature Granulocytes: 1 %
Lymphocytes Relative: 9 %
Lymphocytes Relative: 16 %
Lymphs Abs: 0.2 10*3/uL — ABNORMAL LOW (ref 0.7–4.0)
Lymphs Abs: 0.3 10*3/uL — ABNORMAL LOW (ref 0.7–4.0)
MCH: 27 pg (ref 26.0–34.0)
MCH: 26.5 pg (ref 26.0–34.0)
MCHC: 30.7 g/dL (ref 30.0–36.0)
MCHC: 31.2 g/dL (ref 30.0–36.0)
MCV: 88 fL (ref 80.0–100.0)
MCV: 84.9 fL (ref 80.0–100.0)
Monocytes Absolute: 0.1 10*3/uL (ref 0.1–1.0)
Monocytes Absolute: 0 10*3/uL — ABNORMAL LOW (ref 0.1–1.0)
Monocytes Relative: 2 %
Monocytes Relative: 2 %
Neutro Abs: 2.1 10*3/uL (ref 1.7–7.7)
Neutro Abs: 1.6 10*3/uL — ABNORMAL LOW (ref 1.7–7.7)
Neutrophils Relative %: 88 %
Neutrophils Relative %: 79 %
Platelet Count: 109 10*3/uL — ABNORMAL LOW (ref 150–400)
Platelet Count: 96 10*3/uL — ABNORMAL LOW (ref 150–400)
RBC: 3 MIL/uL — ABNORMAL LOW (ref 3.87–5.11)
RBC: 2.72 MIL/uL — ABNORMAL LOW (ref 3.87–5.11)
RDW: 16.8 % — ABNORMAL HIGH (ref 11.5–15.5)
RDW: 16.9 % — ABNORMAL HIGH (ref 11.5–15.5)
WBC Count: 2.4 10*3/uL — ABNORMAL LOW (ref 4.0–10.5)
WBC Count: 2 10*3/uL — ABNORMAL LOW (ref 4.0–10.5)
nRBC: 0 % (ref 0.0–0.2)
nRBC: 0 % (ref 0.0–0.2)

## 2023-11-27 LAB — MAGNESIUM: Magnesium: 1.8 mg/dL (ref 1.7–2.4)

## 2023-11-27 MED ORDER — HEPARIN SOD (PORK) LOCK FLUSH 100 UNIT/ML IV SOLN
500.0000 [IU] | Freq: Once | INTRAVENOUS | Status: AC
  Administered 2023-11-27: 500 [IU]

## 2023-11-27 MED ORDER — SODIUM CHLORIDE 0.9 % IV SOLN: Freq: Once | INTRAVENOUS | Status: AC

## 2023-11-27 MED ORDER — METHYLPREDNISOLONE SODIUM SUCC 40 MG IJ SOLR
40.0000 mg | Freq: Once | INTRAMUSCULAR | Status: AC
  Administered 2023-11-27: 40 mg via INTRAVENOUS

## 2023-11-27 MED ORDER — FLUCONAZOLE 150 MG PO TABS: 150.0000 mg | ORAL_TABLET | ORAL | 0 refills | Status: AC

## 2023-11-27 MED ORDER — NYSTATIN 100000 UNIT/GM EX POWD: 1.0000 | Freq: Three times a day (TID) | CUTANEOUS | 0 refills | Status: DC

## 2023-11-27 MED ORDER — METHYLPREDNISOLONE 4 MG PO TBPK: ORAL_TABLET | ORAL | 0 refills | Status: DC

## 2023-11-27 MED ORDER — SODIUM CHLORIDE 0.9% FLUSH
10.0000 mL | Freq: Once | INTRAVENOUS | Status: AC
  Administered 2023-11-27: 10 mL

## 2023-11-27 NOTE — Telephone Encounter (Signed)
Itching /"rash"-started Saturday. "A lot of itching  on the front/back of neck under breasts, back of head.  She says there is redness under her neck and looks like bruising under her breast. First carbo/alimta -1 week ago,. Denies SOB, swelling,fever.  Appt today with Jae Dire.

## 2023-11-27 NOTE — Progress Notes (Signed)
Symptom Management Consult Note [facility]    Patient Care Team: [name], MD as PCP - General (Internal Medicine) Wyline Mood Alben Spittle, MD as PCP - Cardiology (Cardiology) Hanley Seamen Dustin Folks, MD as Referring Physician (Optometry) Ilda Mori, MD as Consulting Physician (Obstetrics and Gynecology) Arminda Resides, MD as Consulting Physician (Dermatology) Lytle Butte, RN as Registered Nurse [name], MD as Referring Physician (Internal Medicine) Pickenpack-Cousar, Arty Baumgartner, NP as Nurse Practitioner [facility] and Palliative Medicine)    Name / MRN / DOB: Bess Saltzman  [id number]  1961-04-12   Date of visit: 11/27/2023   Chief Complaint/Reason for visit: rash and fatigue   Current Therapy: Tagrisso and alimta  Last treatment:  Day 1   Cycle 1 on 11/21/23   ASSESSMENT & PLAN: Patient is a 63 y.o. female with oncologic history of stage IVB ( T3, N2, M1c) non-Small Cell Lung Cancer followed by Dr. Arbutus Ped.  I have viewed most recent oncology note and lab work.    #Stage IVB ( T3, N2, M1c) non-Small Cell Lung Cancer  - Next appointment with oncologist is 11/30/23  #Rash and Pruritus -Severe rash and pruritus post-first dose of Alimta, with symptoms on chest, under breasts, neck, scalp, and back of the head. Differential includes drug reaction to Alimta and/or Tagrisso. Possible yeast infection under the breast - Administer 40mg  Solu-Medrol IV in clinic for rash because of severity. Prescribed medrol doosepak to start tomorrow. - Prescribe nystatin powder under her breasts for potential yeast infection and advised keeping the area dry as well as Diflucan to cover for potential vaginal candidiasis as she is high risk being on steroids.  #Fatigue and pancytopenia Likely secondary to chemotherapy (Alimta and Tagrisso). Normal potassium and magnesium levels. Hemoglobin at 8.1, contributing to fatigue.  -Patient will return tomorrow for transfusion of 1  unit. -WBC 2.4 with normal ANC. Platelets low at 109k. Discuss bleeding and neutropenic precautions   #Tachycardia -Acute on chronic since hospital admission earlier this month. Chart review shows recent EKS show sinus tachyacrdia. - Resting heart rate at 107 bpm when rechecked compared to the 128 on arrival. - Administer 500 mL IV fluids for hydration support.  #Vaginal Discharge -Dry vaginal discharge with minor itching, discussed precautionary use of Diflucan as she is higher risk to develop this while on steroids.  Plan discussed with Dr. Arbutus Ped who agrees. Strict ED precautions discussed should symptoms worsen.   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)   08/24/2023 Cancer Staging   Staging form: Lung, AJCC 8th Edition - Clinical: Stage IVB (cT3, cN2, cM1c) - Signed by Si Gaul, MD on 08/24/2023   09/12/2023 - 09/12/2023 Chemotherapy   Patient is on Treatment Plan : LUNG NSCLC Carboplatin (6) + Paclitaxel (200) + Pembrolizumab (200) D1 q21d x 4 cycles / Pembrolizumab (200) Maintenance D1 q21d     11/21/2023 -  Chemotherapy   Patient is on Treatment Plan : LUNG NSCLC Pemetrexed + Carboplatin q21d x 4 Cycles         Interval history-: Discussed the use of AI scribe software for clinical note transcription with the patient, who gave verbal consent to proceed.   Rebecca Ochoa is a 63 y.o. female with oncologic history as above presenting to [facility] today with chief complaint of rash and fatigue. Patient is accompanied by significant  other who provides additional history.  The patient presents with a severe, widespread rash to her upper body and intense itching that started x3 days ago. The rash is extensive, covering the chest, under the breasts, and scalp. The patient also reported fatigue, which has been more  bothersome since the start of the Alimta treatment. She applied OTC CeraVe lotion without improvement. She denies history of similar rash. Denies any new soap or lotions.  In addition to the rash and fatigue, the patient reported vaginal discharge, which was dry and found in the underwear. She is unsure that the color of the discharge was. The patient also reported only minor vaginal itching. The patient has been on a daily dose of 0.5mg  prednisone, which was requested by the patient to maintain appetite. Patient's significant other noticed her resting HR was elevated to 106 at home since her last treatment. Usually, her resting HR is in the 90s. She denies any fever, shortness of breath or chest pain.    ROS  All other systems are reviewed and are negative for acute change except as noted in the HPI.    Allergies  Allergen Reactions   Biaxin [Clarithromycin] Nausea Only     Past Medical History:  Diagnosis Date   Anemia    Asthma, mild intermittent, well-controlled    GERD (gastroesophageal reflux disease)    Hx of migraines    Hyperlipidemia    Hypertension    Vitamin D deficiency      Past Surgical History:  Procedure Laterality Date   BREAST SURGERY     reduction   BRONCHIAL BIOPSY  08/08/2023   Procedure: BRONCHIAL BIOPSIES;  Surgeon: Leslye Peer, MD;  Location: [facility] ENDOSCOPY;  Service: Pulmonary;;   BRONCHIAL BRUSHINGS  08/08/2023   Procedure: BRONCHIAL BRUSHINGS;  Surgeon: Leslye Peer, MD;  Location: [facility] ENDOSCOPY;  Service: Pulmonary;;   BRONCHIAL NEEDLE ASPIRATION BIOPSY  08/08/2023   Procedure: BRONCHIAL NEEDLE ASPIRATION BIOPSIES;  Surgeon: Leslye Peer, MD;  Location: [facility] ENDOSCOPY;  Service: Pulmonary;;   ENDOBRONCHIAL ULTRASOUND Bilateral 08/08/2023   Procedure: ENDOBRONCHIAL ULTRASOUND;  Surgeon: Leslye Peer, MD;  Location: [facility] ENDOSCOPY;  Service: Pulmonary;  Laterality: Bilateral;   HEMOSTASIS CONTROL  08/08/2023   Procedure: HEMOSTASIS CONTROL;   Surgeon: Leslye Peer, MD;  Location: [facility] ENDOSCOPY;  Service: Pulmonary;;   IR IMAGING GUIDED PORT INSERTION  09/01/2023   REDUCTION MAMMAPLASTY     TUBAL LIGATION      Social History   Socioeconomic History   Marital status: Domestic Partner    Spouse name: Not on file   Number of children: Not on file   Years of education: Not on file   Highest education level: Not on file  Occupational History   Not on file  Tobacco Use   Smoking status: Former    Types: E-cigarettes   Smokeless tobacco: Never   Tobacco comments:    Quit in October 2024.  11/09/2023 hfb  Vaping Use   Vaping status: Every Day  Substance and Sexual Activity   Alcohol use: Yes    Alcohol/week: 3.0 standard drinks of alcohol    Types: 3 Standard drinks or equivalent per week   Drug use: Never   Sexual activity: Not on file  Other Topics Concern   Not on file  Social History Narrative   Not on file   Social Drivers of Health   Financial Resource Strain: Not on file  Food Insecurity: No Food Insecurity (11/10/2023)  Hunger Vital Sign    Worried About Running Out of Food in the Last Year: Never true    Ran Out of Food in the Last Year: Never true  Transportation Needs: No Transportation Needs (11/10/2023)   PRAPARE - Administrator, Civil Service (Medical): No    Lack of Transportation (Non-Medical): No  Recent Concern: Transportation Needs - Unmet Transportation Needs (10/17/2023)   PRAPARE - Administrator, Civil Service (Medical): Yes    Lack of Transportation (Non-Medical): No  Physical Activity: Not on file  Stress: Not on file  Social Connections: Not on file  Intimate Partner Violence: Not At Risk (11/10/2023)   Humiliation, Afraid, Rape, and Kick questionnaire    Fear of Current or Ex-Partner: No    Emotionally Abused: No    Physically Abused: No    Sexually Abused: No    Family History  Problem Relation Age of Onset   Heart disease Father    Hypertension Father     Diabetes Father    Kidney disease Father    Cancer Brother      Current Outpatient Medications:    acetaminophen (TYLENOL) 500 MG tablet, Take 1,000 mg by mouth every 8 (eight) hours as needed for mild pain., Disp: , Rfl:    ALPRAZolam (XANAX) 0.5 MG tablet, Take 1 tablet (0.5 mg total) by mouth 3 (three) times daily as needed for anxiety., Disp: 60 tablet, Rfl: 0   apixaban (ELIQUIS) 5 MG TABS tablet, Take 1 tablet (5 mg total) by mouth 2 (two) times daily., Disp: 60 tablet, Rfl: 1   clindamycin (CLINDAGEL) 1 % gel, Apply topically 2 (two) times daily., Disp: 30 g, Rfl: 0   cyanocobalamin (VITAMIN B12) 500 MCG tablet, Take 1 tablet (500 mcg total) by mouth daily., Disp: 100 tablet, Rfl: 0   fluconazole (DIFLUCAN) 150 MG tablet, Take 1 tablet (150 mg total) by mouth every 3 (three) days for 2 doses., Disp: 2 tablet, Rfl: 0   folic acid (FOLVITE) 1 MG tablet, Take 1 tablet (1 mg total) by mouth daily., Disp: 30 tablet, Rfl: 2   loperamide (IMODIUM) 2 MG capsule, Take 1 capsule (2 mg total) by mouth as needed for diarrhea or loose stools., Disp: 30 capsule, Rfl: 0   [START ON 11/28/2023] methylPREDNISolone (MEDROL DOSEPAK) 4 MG TBPK tablet, Take 6 pills by mouth day 1, 5 on day 2, 4 on day 3, 3 on day 4, 2 on day 5, 1 on day 6, Disp: 21 tablet, Rfl: 0   nystatin (MYCOSTATIN/NYSTOP) powder, Apply 1 Application topically 3 (three) times daily. Apply to under breast for rash if needed., Disp: 15 g, Rfl: 0   pantoprazole (PROTONIX) 40 MG tablet, Take 1 tablet (40 mg total) by mouth 2 (two) times daily., Disp: 60 tablet, Rfl: 0   potassium chloride SA (KLOR-CON M) 20 MEQ tablet, Take 1 tablet (20 mEq total) by mouth daily for 7 days., Disp: 7 tablet, Rfl: 7   predniSONE (DELTASONE) 10 MG tablet, Take 0.5 tablets (5 mg total) by mouth daily with breakfast., Disp: 30 tablet, Rfl: 1   TAGRISSO 80 MG tablet, Take 80 mg by mouth in the morning. Resume home med with Tagrisso every day., Disp: , Rfl:     temazepam (RESTORIL) 30 MG capsule, Take 1 capsule (30 mg total) by mouth at bedtime as needed for sleep., Disp: 30 capsule, Rfl: 0   albuterol (PROVENTIL) (2.5 MG/3ML) 0.083% nebulizer solution, Take 3 mLs (2.5  mg total) by nebulization every 4 (four) hours as needed for wheezing or shortness of breath. (Patient not taking: Reported on 11/09/2023), Disp: 75 mL, Rfl: 12   HYDROcodone-acetaminophen (NORCO/VICODIN) 5-325 MG tablet, Take 1 tablet by mouth every 6 (six) hours as needed. (Patient taking differently: Take 1 tablet by mouth every 6 (six) hours as needed (for pain).), Disp: 21 tablet, Rfl: 0   ondansetron (ZOFRAN) 4 MG tablet, Take 1 tablet (4 mg total) by mouth every 6 (six) hours as needed for nausea., Disp: 20 tablet, Rfl: 0   ondansetron (ZOFRAN) 8 MG tablet, Take 8 mg by mouth every 8 (eight) hours as needed for nausea or vomiting., Disp: , Rfl:    prochlorperazine (COMPAZINE) 10 MG tablet, Take 10 mg by mouth every 6 (six) hours as needed for nausea or vomiting., Disp: , Rfl:   Current Facility-Administered Medications:    methylPREDNISolone sodium succinate (SOLU-MEDROL) 40 mg/mL injection 40 mg, 40 mg, Intravenous, Once, Walisiewicz, Fraidy Mccarrick E, PA-C  PHYSICAL EXAM: ECOG FS:1 - Symptomatic but completely ambulatory    Vitals:   11/27/23 1316 11/27/23 1415  BP: 107/75   Pulse: (!) 128 (!) 109  Resp: 18   Temp: (!) 97.2 F (36.2 C)   TempSrc: Temporal   SpO2: 100%   Weight: 140 lb 3.2 oz (63.6 kg)    Physical Exam Vitals and nursing note reviewed.  Constitutional:      Appearance: She is not ill-appearing or toxic-appearing.  HENT:     Head: Normocephalic.     Mouth/Throat:     Mouth: Mucous membranes are dry.  Eyes:     Conjunctiva/sclera: Conjunctivae normal.  Cardiovascular:     Rate and Rhythm: Regular rhythm. Tachycardia present.     Pulses: Normal pulses.     Heart sounds: Normal heart sounds.  Pulmonary:     Effort: Pulmonary effort is normal.     Breath  sounds: Normal breath sounds.  Abdominal:     General: There is no distension.  Musculoskeletal:     Cervical back: Normal range of motion.  Skin:    General: Skin is warm and dry.     Findings: Rash (torso and scalp. confluent macules. no skin sloughing) present.  Neurological:     Mental Status: She is alert.        LABORATORY DATA: I have reviewed the data as listed    Latest Ref Rng & Units 11/27/2023    3:48 PM 11/27/2023    1:01 PM 11/21/2023    8:59 AM  CBC  WBC 4.0 - 10.5 K/uL 2.0  2.4  12.3   Hemoglobin 12.0 - 15.0 g/dL 7.2  8.1  9.9   Hematocrit 36.0 - 46.0 % 23.1  26.4  31.8   Platelets 150 - 400 K/uL 96  109  208         Latest Ref Rng & Units 11/27/2023    1:01 PM 11/21/2023    8:59 AM 11/10/2023    6:20 AM  CMP  Glucose 70 - 99 mg/dL 782  956  81   BUN 8 - 23 mg/dL 8  10  6    Creatinine 0.44 - 1.00 mg/dL 2.13  0.86  5.78   Sodium 135 - 145 mmol/L 135  139  134   Potassium 3.5 - 5.1 mmol/L 4.1  3.3  3.2   Chloride 98 - 111 mmol/L 100  102  107   CO2 22 - 32 mmol/L 30  30  22  Calcium 8.9 - 10.3 mg/dL 9.1  9.1  7.7   Total Protein 6.5 - 8.1 g/dL 6.4  6.3    Total Bilirubin 0.0 - 1.2 mg/dL 0.6  0.7    Alkaline Phos 38 - 126 U/L 127  144    AST 15 - 41 U/L 33  38    ALT 0 - 44 U/L 24  27         RADIOGRAPHIC STUDIES (from last 24 hours if applicable) I have personally reviewed the radiological images as listed and agreed with the findings in the report. No results found.      Visit Diagnosis: 1. Neoplastic malignant related fatigue   2. Rash   3. Tachycardia   4. Antineoplastic chemotherapy induced anemia   5. Primary malignant neoplasm of right lung metastatic to other site [facility])      Orders Placed This Encounter  Procedures   Care order/instruction    Transfuse Parameters    Standing Status:   Future    Expiration Date:   11/26/2024   Type and screen         Standing Status:   Future    Number of Occurrences:   1    Expiration  Date:   11/26/2024    All questions were answered. The patient knows to call the clinic with any problems, questions or concerns. No barriers to learning was detected.  A total of more than 30 minutes were spent on this encounter with face-to-face time and non-face-to-face time, including preparing to see the patient, ordering tests and/or medications, counseling the patient and coordination of care as outlined above.    Thank you for allowing me to participate in the care of this patient.    Shanon Ace, PA-C Department of Hematology/Oncology [facility] at [facility] Phone: [phone]  [phone]    11/27/2023 4:06 PM

## 2023-11-28 ENCOUNTER — Inpatient Hospital Stay: Payer: No Typology Code available for payment source

## 2023-11-28 ENCOUNTER — Other Ambulatory Visit: Payer: Self-pay | Admitting: Nurse Practitioner

## 2023-11-28 ENCOUNTER — Other Ambulatory Visit: Payer: Self-pay | Admitting: Internal Medicine

## 2023-11-28 DIAGNOSIS — Z5111 Encounter for antineoplastic chemotherapy: Secondary | ICD-10-CM | POA: Diagnosis not present

## 2023-11-28 DIAGNOSIS — R530 Neoplastic (malignant) related fatigue: Secondary | ICD-10-CM

## 2023-11-28 DIAGNOSIS — F419 Anxiety disorder, unspecified: Secondary | ICD-10-CM

## 2023-11-28 LAB — PREPARE RBC (CROSSMATCH)

## 2023-11-28 MED ORDER — SODIUM CHLORIDE 0.9% FLUSH
10.0000 mL | INTRAVENOUS | Status: AC | PRN
  Administered 2023-11-28: 10 mL

## 2023-11-28 MED ORDER — SODIUM CHLORIDE 0.9% IV SOLUTION
250.0000 mL | INTRAVENOUS | Status: DC
  Administered 2023-11-28: 100 mL via INTRAVENOUS

## 2023-11-28 MED ORDER — ACETAMINOPHEN 325 MG PO TABS
650.0000 mg | ORAL_TABLET | Freq: Once | ORAL | Status: AC
  Administered 2023-11-28: 650 mg via ORAL
  Filled 2023-11-28: qty 2

## 2023-11-28 MED ORDER — HEPARIN SOD (PORK) LOCK FLUSH 100 UNIT/ML IV SOLN
500.0000 [IU] | Freq: Every day | INTRAVENOUS | Status: AC | PRN
  Administered 2023-11-28: 500 [IU]

## 2023-11-28 MED ORDER — DIPHENHYDRAMINE HCL 25 MG PO CAPS
25.0000 mg | ORAL_CAPSULE | Freq: Once | ORAL | Status: AC
  Administered 2023-11-28: 25 mg via ORAL
  Filled 2023-11-28: qty 1

## 2023-11-28 NOTE — Patient Instructions (Signed)

## 2023-11-29 LAB — TYPE AND SCREEN
ABO/RH(D): B POS
Antibody Screen: NEGATIVE
Unit division: 0

## 2023-11-29 LAB — BPAM RBC
Blood Product Expiration Date: 202502172359
ISSUE DATE / TIME: 202501281333
Unit Type and Rh: 7300

## 2023-11-30 ENCOUNTER — Inpatient Hospital Stay: Payer: No Typology Code available for payment source

## 2023-11-30 ENCOUNTER — Inpatient Hospital Stay (HOSPITAL_BASED_OUTPATIENT_CLINIC_OR_DEPARTMENT_OTHER): Payer: No Typology Code available for payment source | Admitting: Internal Medicine

## 2023-11-30 VITALS — BP 127/82 | HR 123 | Temp 97.4°F | Resp 15 | Ht 65.0 in | Wt 139.4 lb

## 2023-11-30 DIAGNOSIS — C3411 Malignant neoplasm of upper lobe, right bronchus or lung: Secondary | ICD-10-CM

## 2023-11-30 DIAGNOSIS — Z95828 Presence of other vascular implants and grafts: Secondary | ICD-10-CM

## 2023-11-30 DIAGNOSIS — R000 Tachycardia, unspecified: Secondary | ICD-10-CM

## 2023-11-30 DIAGNOSIS — Z5111 Encounter for antineoplastic chemotherapy: Secondary | ICD-10-CM | POA: Diagnosis not present

## 2023-11-30 LAB — CMP (CANCER CENTER ONLY)
ALT: 25 U/L (ref 0–44)
AST: 28 U/L (ref 15–41)
Albumin: 3.4 g/dL — ABNORMAL LOW (ref 3.5–5.0)
Alkaline Phosphatase: 130 U/L — ABNORMAL HIGH (ref 38–126)
Anion gap: 7 (ref 5–15)
BUN: 8 mg/dL (ref 8–23)
CO2: 29 mmol/L (ref 22–32)
Calcium: 9.2 mg/dL (ref 8.9–10.3)
Chloride: 102 mmol/L (ref 98–111)
Creatinine: 0.59 mg/dL (ref 0.44–1.00)
GFR, Estimated: >60 mL/min (ref >60)
Glucose, Bld: 93 mg/dL (ref 70–99)
Potassium: 3.6 mmol/L (ref 3.5–5.1)
Sodium: 138 mmol/L (ref 135–145)
Total Bilirubin: 0.7 mg/dL (ref 0.0–1.2)
Total Protein: 6.4 g/dL — ABNORMAL LOW (ref 6.5–8.1)

## 2023-11-30 LAB — CBC WITH DIFFERENTIAL (CANCER CENTER ONLY)
Abs Immature Granulocytes: 0.01 10*3/uL (ref 0.00–0.07)
Basophils Absolute: 0 10*3/uL (ref 0.0–0.1)
Basophils Relative: 0 %
Eosinophils Absolute: 0.1 10*3/uL (ref 0.0–0.5)
Eosinophils Relative: 2 %
HCT: 30.4 % — ABNORMAL LOW (ref 36.0–46.0)
Hemoglobin: 9.6 g/dL — ABNORMAL LOW (ref 12.0–15.0)
Immature Granulocytes: 0 %
Lymphocytes Relative: 14 %
Lymphs Abs: 0.4 10*3/uL — ABNORMAL LOW (ref 0.7–4.0)
MCH: 26.8 pg (ref 26.0–34.0)
MCHC: 31.6 g/dL (ref 30.0–36.0)
MCV: 84.9 fL (ref 80.0–100.0)
Monocytes Absolute: 0.2 10*3/uL (ref 0.1–1.0)
Monocytes Relative: 6 %
Neutro Abs: 2.1 10*3/uL (ref 1.7–7.7)
Neutrophils Relative %: 78 %
Platelet Count: 74 10*3/uL — ABNORMAL LOW (ref 150–400)
RBC: 3.58 MIL/uL — ABNORMAL LOW (ref 3.87–5.11)
RDW: 17.5 % — ABNORMAL HIGH (ref 11.5–15.5)
WBC Count: 2.8 10*3/uL — ABNORMAL LOW (ref 4.0–10.5)
nRBC: 0 % (ref 0.0–0.2)

## 2023-11-30 MED ORDER — SODIUM CHLORIDE 0.9% FLUSH
10.0000 mL | Freq: Once | INTRAVENOUS | Status: AC
  Administered 2023-11-30: 10 mL

## 2023-11-30 MED ORDER — HEPARIN SOD (PORK) LOCK FLUSH 100 UNIT/ML IV SOLN
500.0000 [IU] | Freq: Once | INTRAVENOUS | Status: AC
  Administered 2023-11-30: 500 [IU]

## 2023-11-30 NOTE — Progress Notes (Signed)
[facility] [phone]   [phone]  OFFICE PROGRESS NOTE  [name], MD [address] [place] [address]  DIAGNOSIS: Stage IVB ( T3, N2, M1c) non-Small Cell Lung Cancer, poorly differentiated squamous cell carcinoma presented with large right upper lobe lung mass in addition to large necrotic right hilar/mediastinal mass and innumerable hypermetabolic pulmonary metastatic lesions in addition to liver and bone metastasis as well as abdominal celiac axis lymphadenopathy diagnosed in October 2024.    PDL1: 0%   Molecular Studies: Positive for EGFR G719A   PRIOR THERAPY: Palliative radiotherapy to the large hilar and mediastinal mass under the care of Dr. Roselind Messier last dose on 09/04/23   CURRENT THERAPY: Tagrisso 80 mg p.o. daily.  She will start concurrent systemic chemotherapy with carboplatin for AUC of 5 and Alimta 500 Mg/M2 on November 14, 2023.  INTERVAL HISTORY: Rebecca Ochoa 63 y.o. female returns to the clinic today for follow-up visit accompanied by her boyfriend Kathlene November.Discussed the use of AI scribe software for clinical note transcription with the patient, who gave verbal consent to proceed.  History of Present Illness   The patient is a 63 year old female with stage four non-small cell lung cancer who presents for follow-up after starting chemotherapy.  Diagnosed with stage four non-small cell lung cancer, adenocarcinoma, in October 2024, with a positive EGFR mutation, G719A. She has been on Tagrisso 80 mg daily since October and recently started her first cycle of chemotherapy with carboplatin and Alimta last week.  Following chemotherapy, she initially felt well, describing herself as feeling like a 'super woman' and was able to perform daily activities such as cooking and laundry. However, by Friday, she began experiencing fatigue and a lack of appetite, stating she 'could not make myself drink.' This was followed by  the onset of itching, for which she received Solu-Medrol injections. She reports a higher heart rate than usual, noting it was in the upper eighties, which she attributes to dehydration and anemia. She also experienced a sensation in her throat when swallowing pills, which improved after consuming a milkshake.  She continues to experience epistaxis, which she associates with her use of Eliquis and the dry winter air.  Her blood work shows a white blood count of 2.8 and platelets at 74. Her hemoglobin has improved to 9.6 from a previous low of 7.2. She is concerned about the possibility of needing blood transfusions.  She has been taking potassium supplements daily and is awaiting current potassium levels to determine if she needs to continue supplementation.  She reports pain in the area where she previously received radiation, particularly when using the bathroom.       MEDICAL HISTORY: Past Medical History:  Diagnosis Date   Anemia    Asthma, mild intermittent, well-controlled    GERD (gastroesophageal reflux disease)    Hx of migraines    Hyperlipidemia    Hypertension    Vitamin D deficiency     ALLERGIES:  is allergic to biaxin [clarithromycin].  MEDICATIONS:  Current Outpatient Medications  Medication Sig Dispense Refill   acetaminophen (TYLENOL) 500 MG tablet Take 1,000 mg by mouth every 8 (eight) hours as needed for mild pain.     albuterol (PROVENTIL) (2.5 MG/3ML) 0.083% nebulizer solution Take 3 mLs (2.5 mg total) by nebulization every 4 (four) hours as needed for wheezing or shortness of breath. (Patient not taking: Reported on 11/09/2023) 75 mL 12   ALPRAZolam (XANAX) 0.5 MG  tablet Take 1 tablet (0.5 mg total) by mouth 3 (three) times daily as needed for anxiety. 60 tablet 0   apixaban (ELIQUIS) 5 MG TABS tablet Take 1 tablet (5 mg total) by mouth 2 (two) times daily. 60 tablet 1   clindamycin (CLINDAGEL) 1 % gel Apply topically 2 (two) times daily. 30 g 0   cyanocobalamin  (VITAMIN B12) 500 MCG tablet Take 1 tablet (500 mcg total) by mouth daily. 100 tablet 0   fluconazole (DIFLUCAN) 150 MG tablet Take 1 tablet (150 mg total) by mouth every 3 (three) days for 2 doses. 2 tablet 0   folic acid (FOLVITE) 1 MG tablet Take 1 tablet (1 mg total) by mouth daily. 30 tablet 2   HYDROcodone-acetaminophen (NORCO/VICODIN) 5-325 MG tablet Take 1 tablet by mouth every 6 (six) hours as needed. (Patient taking differently: Take 1 tablet by mouth every 6 (six) hours as needed (for pain).) 21 tablet 0   loperamide (IMODIUM) 2 MG capsule Take 1 capsule (2 mg total) by mouth as needed for diarrhea or loose stools. 30 capsule 0   methylPREDNISolone (MEDROL DOSEPAK) 4 MG TBPK tablet Take 6 pills by mouth day 1, 5 on day 2, 4 on day 3, 3 on day 4, 2 on day 5, 1 on day 6 21 tablet 0   nystatin (MYCOSTATIN/NYSTOP) powder Apply 1 Application topically 3 (three) times daily. Apply to under breast for rash if needed. 15 g 0   ondansetron (ZOFRAN) 4 MG tablet Take 1 tablet (4 mg total) by mouth every 6 (six) hours as needed for nausea. 20 tablet 0   ondansetron (ZOFRAN) 8 MG tablet Take 8 mg by mouth every 8 (eight) hours as needed for nausea or vomiting.     pantoprazole (PROTONIX) 40 MG tablet Take 1 tablet (40 mg total) by mouth 2 (two) times daily. 60 tablet 0   potassium chloride SA (KLOR-CON M) 20 MEQ tablet Take 1 tablet (20 mEq total) by mouth daily for 7 days. 7 tablet 7   predniSONE (DELTASONE) 10 MG tablet Take 0.5 tablets (5 mg total) by mouth daily with breakfast. 30 tablet 1   prochlorperazine (COMPAZINE) 10 MG tablet Take 10 mg by mouth every 6 (six) hours as needed for nausea or vomiting.     TAGRISSO 80 MG tablet TAKE 1 TABLET DAILY 30 tablet 3   temazepam (RESTORIL) 30 MG capsule Take 1 capsule (30 mg total) by mouth at bedtime as needed for sleep. 30 capsule 0   No current facility-administered medications for this visit.    SURGICAL HISTORY:  Past Surgical History:   Procedure Laterality Date   BREAST SURGERY     reduction   BRONCHIAL BIOPSY  08/08/2023   Procedure: BRONCHIAL BIOPSIES;  Surgeon: Leslye Peer, MD;  Location: [facility] ENDOSCOPY;  Service: Pulmonary;;   BRONCHIAL BRUSHINGS  08/08/2023   Procedure: BRONCHIAL BRUSHINGS;  Surgeon: Leslye Peer, MD;  Location: [facility] ENDOSCOPY;  Service: Pulmonary;;   BRONCHIAL NEEDLE ASPIRATION BIOPSY  08/08/2023   Procedure: BRONCHIAL NEEDLE ASPIRATION BIOPSIES;  Surgeon: Leslye Peer, MD;  Location: [facility] ENDOSCOPY;  Service: Pulmonary;;   ENDOBRONCHIAL ULTRASOUND Bilateral 08/08/2023   Procedure: ENDOBRONCHIAL ULTRASOUND;  Surgeon: Leslye Peer, MD;  Location: [facility] ENDOSCOPY;  Service: Pulmonary;  Laterality: Bilateral;   HEMOSTASIS CONTROL  08/08/2023   Procedure: HEMOSTASIS CONTROL;  Surgeon: Leslye Peer, MD;  Location: [facility] ENDOSCOPY;  Service: Pulmonary;;   IR IMAGING GUIDED PORT INSERTION  09/01/2023   REDUCTION  MAMMAPLASTY     TUBAL LIGATION      REVIEW OF SYSTEMS:  Constitutional: positive for fatigue Eyes: negative Ears, nose, mouth, throat, and face: positive for epistaxis Respiratory: negative Cardiovascular: negative Gastrointestinal: negative Genitourinary:negative Integument/breast: negative Hematologic/lymphatic: negative Musculoskeletal:positive for muscle weakness Neurological: negative Behavioral/Psych: negative Endocrine: negative Allergic/Immunologic: negative   PHYSICAL EXAMINATION: General appearance: alert, cooperative, fatigued, and no distress Head: Normocephalic, without obvious abnormality, atraumatic Neck: no adenopathy, no JVD, supple, symmetrical, trachea midline, and thyroid not enlarged, symmetric, no tenderness/mass/nodules Lymph nodes: Cervical, supraclavicular, and axillary nodes normal. Resp: clear to auscultation bilaterally Back: symmetric, no curvature. ROM normal. No CVA tenderness. Cardio: regular rate and rhythm, S1, S2 normal, no murmur, click, rub or  gallop GI: soft, non-tender; bowel sounds normal; no masses,  no organomegaly Extremities: extremities normal, atraumatic, no cyanosis or edema Neurologic: Alert and oriented X 3, normal strength and tone. Normal symmetric reflexes. Normal coordination and gait  ECOG PERFORMANCE STATUS: 1 - Symptomatic but completely ambulatory  Blood pressure 127/82, pulse (!) 123, temperature (!) 97.4 F (36.3 C), temperature source Temporal, resp. rate 15, height 5\' 5"  (1.651 m), weight 139 lb 6.4 oz (63.2 kg), SpO2 97%.  LABORATORY DATA: Lab Results  Component Value Date   WBC 2.0 (L) 11/27/2023   HGB 7.2 (L) 11/27/2023   HCT 23.1 (L) 11/27/2023   MCV 84.9 11/27/2023   PLT 96 (L) 11/27/2023      Chemistry      Component Value Date/Time   NA 135 11/27/2023 1548   K 3.9 11/27/2023 1548   CL 101 11/27/2023 1548   CO2 31 11/27/2023 1548   BUN 8 11/27/2023 1548   CREATININE 0.49 11/27/2023 1548   CREATININE 1.05 10/12/2022 1442      Component Value Date/Time   CALCIUM 8.8 (L) 11/27/2023 1548   ALKPHOS 115 11/27/2023 1548   AST 28 11/27/2023 1548   ALT 21 11/27/2023 1548   BILITOT 0.5 11/27/2023 1548       RADIOGRAPHIC STUDIES: CT Angio Chest PE W and/or Wo Contrast Result Date: 11/09/2023 CLINICAL DATA:  History of non-small-cell lung cancer with weakness, tachycardia, hypotension, generalized weakness, shortness of breath, and abdominal pain. * Tracking Code: BO * EXAM: CT ANGIOGRAPHY CHEST CT ABDOMEN AND PELVIS WITH CONTRAST TECHNIQUE: Multidetector CT imaging of the chest was performed using the standard protocol during bolus administration of intravenous contrast. Multiplanar CT image reconstructions and MIPs were obtained to evaluate the vascular anatomy. Multidetector CT imaging of the abdomen and pelvis was performed using the standard protocol during bolus administration of intravenous contrast. RADIATION DOSE REDUCTION: This exam was performed according to the departmental  dose-optimization program which includes automated exposure control, adjustment of the mA and/or kV according to patient size and/or use of iterative reconstruction technique. CONTRAST:  OMNIPAQUE IOHEXOL 350 MG/ML SOLN COMPARISON:  Same day chest radiograph, CTA chest dated 10/17/2023, and multiple priors including nuclear medicine PET-CT dated 08/11/2023 FINDINGS: CTA CHEST FINDINGS Cardiovascular: Left chest wall port tip terminates in the right atrium. The study is high quality for the evaluation of pulmonary embolism. There are no filling defects in the central, lobar, segmental or subsegmental pulmonary artery branches to suggest acute pulmonary embolism. Similar marked effacement of right upper lobe pulmonary arteries. Great vessels are normal in course and caliber. Normal heart size. No significant pericardial fluid/thickening. Coronary artery calcifications. Mediastinum/Nodes: 1.5 cm right thyroid nodule (4:20), slowly increasing in size since 09/04/2023 when it was subtly seen and measured 5 mm.  Normal esophagus. Right hilar mass as below. 11 mm left hilar lymph node (4:62), previously 4 mm. Lungs/Pleura: The central airways are patent. Apical predominant paraseptal emphysema. Multifocal bilateral masses and pulmonary nodules, many of which demonstrate cavitary changes. Interval slight decrease in size of dominant right upper lobe mass measuring 4.1 x 3.0 cm (12:32), previously 4.5 x 3.2 cm. A few nodules are increased in size, for example 1.5 x 1.0 cm right lower lobe (12:49), previously 1.2 x 0.9 cm. Others are unchanged, for example right hilar mass encasing the right pulmonary vessels and bronchi measuring 5.4 x 5.2 cm (12:54). Interval cavitation of a few nodules, for example left upper lobe (12:49) and right lower lobe (12:60). No pneumothorax. Interval resolution of left pleural effusion. Musculoskeletal: No acute or abnormal lytic or blastic osseous lesions. Review of the MIP images confirms  the above findings. CT ABDOMEN and PELVIS FINDINGS Hepatobiliary: Multifocal, lobulated centrally necrotic masses are not substantially changed in size from 10/17/2023 but increased in size from 08/11/2023, for example 8.3 x 4.5 cm segment 2/3 (2:25), previously 6.0 x 3.3 cm on 08/11/2023. innumerable subcentimeter hypodensities throughout the liver are new from 08/11/2023. No intra or extrahepatic biliary ductal dilation. Normal gallbladder. Pancreas: No focal lesions or main ductal dilation. Spleen: Unchanged multifocal hypoattenuating lesions. Adrenals/Urinary Tract: No adrenal nodules. Multifocal bilateral renal hypodensities, which do not measure simple fluid attenuation, and a few of which appear slightly increased in size, for example 1.8 cm right interpolar (2:32), previously 1.2 cm on 08/14/2023. No hydronephrosis or calculi. No focal bladder wall thickening. Stomach/Bowel: Normal appearance of the stomach. No evidence of bowel wall thickening, distention, or inflammatory changes. Colonic diverticulosis without acute diverticulitis. Normal appendix. Vascular/Lymphatic: Aortic atherosclerosis. Unchanged size of necrotic periportal node measuring 2.1 cm (2:32) and portacaval node measuring 2.2 cm (2:30). ( Reproductive: No adnexal masses. Other: No free fluid, fluid collection, or free air. Musculoskeletal: No acute or abnormal lytic or blastic osseous lesions to correspond to sites of prior radiotracer uptake. Multilevel degenerative changes of the lumbar spine. IMPRESSION: CTA CHEST: 1. No evidence of pulmonary embolism. 2. Multifocal bilateral masses and pulmonary nodules, some of which are slightly decreased in size and others are increased in size, consistent with mixed response to therapy. 3. Interval resolution of left pleural effusion. 4. Interval enlargement of left hilar lymphadenopathy, likely metastasis. 5. Slowly enlarging 1.5 cm right thyroid nodule, suspicious for metastasis. CT ABDOMEN AND  PELVIS: 1. No acute abnormality in the abdomen or pelvis. 2. Multifocal, lobulated centrally necrotic hepatic masses are not substantially changed in size from 10/17/2023 but increased in size from PET dated 08/11/2023. Innumerable subcentimeter hypodensities throughout the liver are new from 08/11/2023 and suspicious for metastatic disease. 3. Multifocal bilateral renal hypodensities, which do not measure simple fluid attenuation, and a few of which appear slightly increased in size from 08/14/2023, suspicious for metastatic disease. 4. Unchanged size of necrotic periportal and portacaval lymph nodes compared to 10/17/2023. 5. Unchanged splenic hypodensities, indeterminate. Aortic Atherosclerosis (ICD10-I70.0) and Emphysema (ICD10-J43.9). Electronically Signed   By: Agustin Cree M.D.   On: 11/09/2023 22:13   CT ABDOMEN PELVIS W CONTRAST Result Date: 11/09/2023 CLINICAL DATA:  History of non-small-cell lung cancer with weakness, tachycardia, hypotension, generalized weakness, shortness of breath, and abdominal pain. * Tracking Code: BO * EXAM: CT ANGIOGRAPHY CHEST CT ABDOMEN AND PELVIS WITH CONTRAST TECHNIQUE: Multidetector CT imaging of the chest was performed using the standard protocol during bolus administration of intravenous contrast. Multiplanar CT image reconstructions  and MIPs were obtained to evaluate the vascular anatomy. Multidetector CT imaging of the abdomen and pelvis was performed using the standard protocol during bolus administration of intravenous contrast. RADIATION DOSE REDUCTION: This exam was performed according to the departmental dose-optimization program which includes automated exposure control, adjustment of the mA and/or kV according to patient size and/or use of iterative reconstruction technique. CONTRAST:  OMNIPAQUE IOHEXOL 350 MG/ML SOLN COMPARISON:  Same day chest radiograph, CTA chest dated 10/17/2023, and multiple priors including nuclear medicine PET-CT dated 08/11/2023  FINDINGS: CTA CHEST FINDINGS Cardiovascular: Left chest wall port tip terminates in the right atrium. The study is high quality for the evaluation of pulmonary embolism. There are no filling defects in the central, lobar, segmental or subsegmental pulmonary artery branches to suggest acute pulmonary embolism. Similar marked effacement of right upper lobe pulmonary arteries. Great vessels are normal in course and caliber. Normal heart size. No significant pericardial fluid/thickening. Coronary artery calcifications. Mediastinum/Nodes: 1.5 cm right thyroid nodule (4:20), slowly increasing in size since 09/04/2023 when it was subtly seen and measured 5 mm. Normal esophagus. Right hilar mass as below. 11 mm left hilar lymph node (4:62), previously 4 mm. Lungs/Pleura: The central airways are patent. Apical predominant paraseptal emphysema. Multifocal bilateral masses and pulmonary nodules, many of which demonstrate cavitary changes. Interval slight decrease in size of dominant right upper lobe mass measuring 4.1 x 3.0 cm (12:32), previously 4.5 x 3.2 cm. A few nodules are increased in size, for example 1.5 x 1.0 cm right lower lobe (12:49), previously 1.2 x 0.9 cm. Others are unchanged, for example right hilar mass encasing the right pulmonary vessels and bronchi measuring 5.4 x 5.2 cm (12:54). Interval cavitation of a few nodules, for example left upper lobe (12:49) and right lower lobe (12:60). No pneumothorax. Interval resolution of left pleural effusion. Musculoskeletal: No acute or abnormal lytic or blastic osseous lesions. Review of the MIP images confirms the above findings. CT ABDOMEN and PELVIS FINDINGS Hepatobiliary: Multifocal, lobulated centrally necrotic masses are not substantially changed in size from 10/17/2023 but increased in size from 08/11/2023, for example 8.3 x 4.5 cm segment 2/3 (2:25), previously 6.0 x 3.3 cm on 08/11/2023. innumerable subcentimeter hypodensities throughout the liver are new from  08/11/2023. No intra or extrahepatic biliary ductal dilation. Normal gallbladder. Pancreas: No focal lesions or main ductal dilation. Spleen: Unchanged multifocal hypoattenuating lesions. Adrenals/Urinary Tract: No adrenal nodules. Multifocal bilateral renal hypodensities, which do not measure simple fluid attenuation, and a few of which appear slightly increased in size, for example 1.8 cm right interpolar (2:32), previously 1.2 cm on 08/14/2023. No hydronephrosis or calculi. No focal bladder wall thickening. Stomach/Bowel: Normal appearance of the stomach. No evidence of bowel wall thickening, distention, or inflammatory changes. Colonic diverticulosis without acute diverticulitis. Normal appendix. Vascular/Lymphatic: Aortic atherosclerosis. Unchanged size of necrotic periportal node measuring 2.1 cm (2:32) and portacaval node measuring 2.2 cm (2:30). ( Reproductive: No adnexal masses. Other: No free fluid, fluid collection, or free air. Musculoskeletal: No acute or abnormal lytic or blastic osseous lesions to correspond to sites of prior radiotracer uptake. Multilevel degenerative changes of the lumbar spine. IMPRESSION: CTA CHEST: 1. No evidence of pulmonary embolism. 2. Multifocal bilateral masses and pulmonary nodules, some of which are slightly decreased in size and others are increased in size, consistent with mixed response to therapy. 3. Interval resolution of left pleural effusion. 4. Interval enlargement of left hilar lymphadenopathy, likely metastasis. 5. Slowly enlarging 1.5 cm right thyroid nodule, suspicious for metastasis.  CT ABDOMEN AND PELVIS: 1. No acute abnormality in the abdomen or pelvis. 2. Multifocal, lobulated centrally necrotic hepatic masses are not substantially changed in size from 10/17/2023 but increased in size from PET dated 08/11/2023. Innumerable subcentimeter hypodensities throughout the liver are new from 08/11/2023 and suspicious for metastatic disease. 3. Multifocal bilateral  renal hypodensities, which do not measure simple fluid attenuation, and a few of which appear slightly increased in size from 08/14/2023, suspicious for metastatic disease. 4. Unchanged size of necrotic periportal and portacaval lymph nodes compared to 10/17/2023. 5. Unchanged splenic hypodensities, indeterminate. Aortic Atherosclerosis (ICD10-I70.0) and Emphysema (ICD10-J43.9). Electronically Signed   By: Agustin Cree M.D.   On: 11/09/2023 22:13   DG Chest 2 View Result Date: 11/09/2023 CLINICAL DATA:  Shortness of breath. Back pain. Weakness. Lung cancer. EXAM: CHEST - 2 VIEW COMPARISON:  Chest radiograph dated 10/20/2023. FINDINGS: Left-sided Port-A-Cath in similar position. Bilateral pulmonary nodules consistent with metastatic disease. Similar appearance of masslike opacity in the right upper lobe. No new consolidation. No significant pleural effusion. No pneumothorax. Stable cardiac silhouette. No acute osseous pathology. IMPRESSION: Bilateral pulmonary metastatic disease.  No new findings. Electronically Signed   By: Elgie Collard M.D.   On: 11/09/2023 16:16    ASSESSMENT AND PLAN: This is a very pleasant 64 years old white female with Stage IVB ( T3, N2, M1c) non-Small Cell Lung Cancer, poorly differentiated squamous cell carcinoma presented with large right upper lobe lung mass in addition to large necrotic right hilar/mediastinal mass and innumerable hypermetabolic pulmonary metastatic lesions in addition to liver and bone metastasis as well as abdominal celiac axis lymphadenopathy diagnosed in October 2024.  Molecular studies showed negative PD-L1 expression and positive EGFR mutation G719A She underwent palliative radiotherapy to the large hilar and mediastinal mass under the care of Dr. Roselind Messier last dose on 09/04/23 She is currently on treatment with Tagrisso 80 mg p.o. daily started on September 29, 2023.  The patient has been tolerating this treatment fairly well except for the few episodes of  diarrhea. She will start concurrent chemotherapy with carboplatin for AUC of 5 and Alimta 500 Mg/M2 on November 21, 2023 with Tagrisso 80 mg p.o. daily.  Stage IV Non-Small Cell Lung Cancer (NSCLC) with EGFR Mutation Stage IV NSCLC, adenocarcinoma subtype, diagnosed in October 2024. Tumor positive for EGFR mutation G719A. Currently on Tagrisso (osimertinib) 80 mg daily since October 2024. Recently started first cycle of chemotherapy with carboplatin and pemetrexed. Experienced initial well-being post-treatment likely due to steroids, followed by fatigue, loss of appetite, and itching. Throat discomfort likely due to residual effects of previous radiation and chemotherapy. Mild epistaxis noted, likely exacerbated by Eliquis and dry nasal mucosa. Discussed chemotherapy every three weeks to allow recovery and the importance of hydration to manage side effects. Blood transfusions necessary if hemoglobin drops below 8. - Continue Tagrisso 80 mg daily - Administer chemotherapy with carboplatin and pemetrexed every three weeks - Manage throat discomfort with cough drops and increased fluid intake - Use humidifier and saline nasal drops for epistaxis - Monitor blood counts and potassium levels - Schedule next chemotherapy session on December 12, 2023  Anemia Anemia secondary to chemotherapy and underlying cancer. Hemoglobin improved to 9.6 from previous 7.2. Elevated heart rate due to dehydration and anemia. Emphasized importance of hydration to manage heart rate and anemia. - Encourage hydration to manage heart rate - Monitor hemoglobin levels; consider transfusion if hemoglobin <8  Thrombocytopenia Thrombocytopenia secondary to chemotherapy and Tagrisso. Platelet count at 74,000, expected  with current treatment. - Monitor platelet counts regularly  Hypokalemia Currently taking potassium supplements daily. Awaiting current potassium level results. Discussed importance of dietary intake of  potassium-rich foods. - Continue current potassium supplementation - Adjust potassium supplementation based on lab results - Encourage dietary intake of potassium-rich foods  General Health Maintenance Discussed importance of hydration and dietary modifications to support overall health and manage side effects of treatment. - Encourage intake of potassium-rich foods such as sweet potatoes, orange juice, and bananas  Follow-up - Schedule lab work for next week - Confirm and correct chemotherapy appointment date to December 12, 2023.   The patient was advised to call immediately if she has any concerning symptoms in the interval. The patient voices understanding of current disease status and treatment options and is in agreement with the current care plan.  All questions were answered. The patient knows to call the clinic with any problems, questions or concerns. We can certainly see the patient much sooner if necessary. The total time spent in the appointment was 30 minutes.  Disclaimer: This note was dictated with voice recognition software. Similar sounding words can inadvertently be transcribed and may not be corrected upon review.

## 2023-12-01 LAB — ACID FAST CULTURE WITH REFLEXED SENSITIVITIES (MYCOBACTERIA): Acid Fast Culture: NEGATIVE

## 2023-12-04 ENCOUNTER — Telehealth: Payer: Self-pay | Admitting: Nurse Practitioner

## 2023-12-04 ENCOUNTER — Other Ambulatory Visit: Payer: Self-pay | Admitting: Nurse Practitioner

## 2023-12-04 DIAGNOSIS — F419 Anxiety disorder, unspecified: Secondary | ICD-10-CM

## 2023-12-04 NOTE — Telephone Encounter (Signed)
Patient is requesting refills on her potassium and alprazolam to [facility] on [address]. Epic shows that the potassium was sent in on 11/23/23 but it shows "ended" and patient said that its showing that it was "on hold" when she looks at [employer]. Please advise.

## 2023-12-05 ENCOUNTER — Other Ambulatory Visit: Payer: Self-pay | Admitting: *Deleted

## 2023-12-05 DIAGNOSIS — C3411 Malignant neoplasm of upper lobe, right bronchus or lung: Secondary | ICD-10-CM

## 2023-12-05 DIAGNOSIS — R000 Tachycardia, unspecified: Secondary | ICD-10-CM

## 2023-12-05 NOTE — Progress Notes (Signed)
 Pt called with c/o tachycardia. At rest hr is in the 120s and moving 140s. Pt states that she's had some diarrhea, but no nausea or vomiting. No dizziness and feels that she is getting enough hydration. These symptoms started yesterday. Pt will be seen in [facility]. Pt called and made aware of appts. Pt verbalized understanding.

## 2023-12-06 ENCOUNTER — Inpatient Hospital Stay: Payer: No Typology Code available for payment source | Attending: Internal Medicine | Admitting: Dietician

## 2023-12-06 ENCOUNTER — Inpatient Hospital Stay: Payer: No Typology Code available for payment source

## 2023-12-06 ENCOUNTER — Ambulatory Visit: Payer: No Typology Code available for payment source | Admitting: Physician Assistant

## 2023-12-06 ENCOUNTER — Other Ambulatory Visit: Payer: Self-pay | Admitting: Nurse Practitioner

## 2023-12-06 ENCOUNTER — Inpatient Hospital Stay (HOSPITAL_BASED_OUTPATIENT_CLINIC_OR_DEPARTMENT_OTHER): Payer: No Typology Code available for payment source | Admitting: Physician Assistant

## 2023-12-06 ENCOUNTER — Other Ambulatory Visit: Payer: No Typology Code available for payment source

## 2023-12-06 ENCOUNTER — Ambulatory Visit: Payer: No Typology Code available for payment source

## 2023-12-06 VITALS — BP 113/81 | HR 123 | Temp 97.9°F | Resp 16 | Ht 65.0 in | Wt 138.5 lb

## 2023-12-06 DIAGNOSIS — E876 Hypokalemia: Secondary | ICD-10-CM | POA: Insufficient documentation

## 2023-12-06 DIAGNOSIS — Z5111 Encounter for antineoplastic chemotherapy: Secondary | ICD-10-CM | POA: Diagnosis present

## 2023-12-06 DIAGNOSIS — D61818 Other pancytopenia: Secondary | ICD-10-CM | POA: Diagnosis not present

## 2023-12-06 DIAGNOSIS — C3411 Malignant neoplasm of upper lobe, right bronchus or lung: Secondary | ICD-10-CM

## 2023-12-06 DIAGNOSIS — G4700 Insomnia, unspecified: Secondary | ICD-10-CM | POA: Diagnosis not present

## 2023-12-06 DIAGNOSIS — C787 Secondary malignant neoplasm of liver and intrahepatic bile duct: Secondary | ICD-10-CM | POA: Insufficient documentation

## 2023-12-06 DIAGNOSIS — R000 Tachycardia, unspecified: Secondary | ICD-10-CM

## 2023-12-06 DIAGNOSIS — C7951 Secondary malignant neoplasm of bone: Secondary | ICD-10-CM | POA: Insufficient documentation

## 2023-12-06 DIAGNOSIS — Z79899 Other long term (current) drug therapy: Secondary | ICD-10-CM | POA: Insufficient documentation

## 2023-12-06 DIAGNOSIS — F17290 Nicotine dependence, other tobacco product, uncomplicated: Secondary | ICD-10-CM | POA: Insufficient documentation

## 2023-12-06 DIAGNOSIS — E878 Other disorders of electrolyte and fluid balance, not elsewhere classified: Secondary | ICD-10-CM | POA: Diagnosis not present

## 2023-12-06 DIAGNOSIS — F419 Anxiety disorder, unspecified: Secondary | ICD-10-CM | POA: Diagnosis not present

## 2023-12-06 DIAGNOSIS — Z95828 Presence of other vascular implants and grafts: Secondary | ICD-10-CM

## 2023-12-06 LAB — CMP (CANCER CENTER ONLY)
ALT: 15 U/L (ref 0–44)
AST: 16 U/L (ref 15–41)
Albumin: 3.4 g/dL — ABNORMAL LOW (ref 3.5–5.0)
Alkaline Phosphatase: 114 U/L (ref 38–126)
Anion gap: 7 (ref 5–15)
BUN: 9 mg/dL (ref 8–23)
CO2: 31 mmol/L (ref 22–32)
Calcium: 9.3 mg/dL (ref 8.9–10.3)
Chloride: 99 mmol/L (ref 98–111)
Creatinine: 0.62 mg/dL (ref 0.44–1.00)
GFR, Estimated: >60 mL/min (ref >60)
Glucose, Bld: 115 mg/dL — ABNORMAL HIGH (ref 70–99)
Potassium: 3.2 mmol/L — ABNORMAL LOW (ref 3.5–5.1)
Sodium: 137 mmol/L (ref 135–145)
Total Bilirubin: 0.7 mg/dL (ref 0.0–1.2)
Total Protein: 6.5 g/dL (ref 6.5–8.1)

## 2023-12-06 LAB — CBC WITH DIFFERENTIAL (CANCER CENTER ONLY)
Abs Immature Granulocytes: 0.01 10*3/uL (ref 0.00–0.07)
Basophils Absolute: 0 10*3/uL (ref 0.0–0.1)
Basophils Relative: 0 %
Eosinophils Absolute: 0.1 10*3/uL (ref 0.0–0.5)
Eosinophils Relative: 2 %
HCT: 29.2 % — ABNORMAL LOW (ref 36.0–46.0)
Hemoglobin: 9.1 g/dL — ABNORMAL LOW (ref 12.0–15.0)
Immature Granulocytes: 1 %
Lymphocytes Relative: 13 %
Lymphs Abs: 0.3 10*3/uL — ABNORMAL LOW (ref 0.7–4.0)
MCH: 27 pg (ref 26.0–34.0)
MCHC: 31.2 g/dL (ref 30.0–36.0)
MCV: 86.6 fL (ref 80.0–100.0)
Monocytes Absolute: 0.4 10*3/uL (ref 0.1–1.0)
Monocytes Relative: 18 %
Neutro Abs: 1.5 10*3/uL — ABNORMAL LOW (ref 1.7–7.7)
Neutrophils Relative %: 66 %
Platelet Count: 103 10*3/uL — ABNORMAL LOW (ref 150–400)
RBC: 3.37 MIL/uL — ABNORMAL LOW (ref 3.87–5.11)
RDW: 17.8 % — ABNORMAL HIGH (ref 11.5–15.5)
WBC Count: 2.2 10*3/uL — ABNORMAL LOW (ref 4.0–10.5)
nRBC: 0 % (ref 0.0–0.2)

## 2023-12-06 LAB — MAGNESIUM: Magnesium: 1.4 mg/dL — ABNORMAL LOW (ref 1.7–2.4)

## 2023-12-06 MED ORDER — POTASSIUM CHLORIDE CRYS ER 20 MEQ PO TBCR: 20.0000 meq | EXTENDED_RELEASE_TABLET | Freq: Every day | ORAL | 0 refills | Status: DC

## 2023-12-06 MED ORDER — SODIUM CHLORIDE 0.9% FLUSH
10.0000 mL | Freq: Once | INTRAVENOUS | Status: AC
  Administered 2023-12-06: 10 mL via INTRAVENOUS

## 2023-12-06 MED ORDER — SODIUM CHLORIDE 0.9% FLUSH
10.0000 mL | Freq: Once | INTRAVENOUS | Status: AC
Start: 2023-12-06 — End: 2023-12-06
  Administered 2023-12-06: 10 mL

## 2023-12-06 MED ORDER — MAGNESIUM OXIDE -MG SUPPLEMENT 400 (240 MG) MG PO TABS: 400.0000 mg | ORAL_TABLET | Freq: Every day | ORAL | 0 refills | Status: AC

## 2023-12-06 MED ORDER — HEPARIN SOD (PORK) LOCK FLUSH 100 UNIT/ML IV SOLN
500.0000 [IU] | Freq: Once | INTRAVENOUS | Status: AC
  Administered 2023-12-06: 500 [IU] via INTRAVENOUS

## 2023-12-06 NOTE — Patient Instructions (Addendum)
 Please stop taking the Tagrisso  until Dr. Sherrod tells you to restart. Do not throw it away incase he recommends restarting it in the future.  Prescription sent to pharmacy for potassium and magnesium  pills. Please discuss potassium refills with Cassie at your next visit.   If you have worsening symptoms please go to the emergency department for evaluation or call 911.

## 2023-12-06 NOTE — Progress Notes (Signed)
 Symptom Management Consult Note [facility]    Patient Care Team: Tonita Fallow, MD as PCP - General (Internal Medicine) Alvan Ronal BRAVO, MD as PCP - Cardiology (Cardiology) Manford Elspeth ORN, MD as Referring Physician (Optometry) Debrah Ade, MD as Consulting Physician (Obstetrics and Gynecology) Joshua Sieving, MD as Consulting Physician (Dermatology) Prentis Duwaine BROCKS, RN as Registered Nurse Tonita Fallow, MD as Referring Physician (Internal Medicine) Pickenpack-Cousar, Fannie SAILOR, NP as Nurse Practitioner [facility] Palliative [facility])    Name / MRN / DOB: Rebecca Ochoa  [id number]  1961-07-29   Date of visit: 12/06/2023   Chief Complaint/Reason for visit: tachycardia   Current Therapy: Tagrisso  and alimta   Last treatment:  Day 1   Cycle 1 on 11/21/23   ASSESSMENT & PLAN: Patient is a 63 y.o. female with oncologic history of  stage IVB ( T3, N2, M1c) non-small cell lung cancer followed by Dr. Sherrod.  I have viewed most recent oncology note and lab work.    #Stage IVB ( T3, N2, M1c) non-Small Cell Lung Cancer  - Next appointment with oncologist is 12/13/23 - Here today for lab check. - Patient will hold Tagrisso  as EKG showing prolonged QTc at 583.   #Tachycardia -EKG today shows sinus tach with prolonged QTc. -Chart review of recent EKGs shows sinus patient in sinus rhythm on 10/18/23 and sinus tachycardia 11/09/23, 11/13/23, and 11/21/23. CTA chest in the ED on 11/09/23 was negative for PE. She is anticoagulated on eliquis  lifelong after submassive PE found during hospitalization in early November 2024. -Patient tachycardic in clinic and it was worse with ambulation up to 160 without hypoxia. HR decreased quickly one she was seated after walking. Denied chest pain. -Urgent referral sent to cardiology and they can see her in 2 days on 12/08/2023. -Had lengthy discussion with patient regarding cardiology referral versus presenting to the ED  today for further evaluation.  She strongly prefers to wait and see cardiology. Discussed this with Dr. Sherrod who agrees she can wait til cardiology appointment. Strict ED precautions discussed should symptoms worsen. Appreciate cardiology being able to work patient in quickly.  #Pancytopenia - Related to treatment. - No infectious symptoms. No active bleeding. Anemia stable and no transfusion indicated at this time. Discussed both neutropenic and bleeding precautions.   #Electrolyte derangement -Labs today show potassium 3.2 and magnesium  1.4.  Will send prescriptions for both to be replaced x 1 week.    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)   08/24/2023 Cancer Staging   Staging form: Lung, AJCC 8th Edition - Clinical: Stage IVB (cT3, cN2, cM1c) - Signed by Sherrod Sherrod, MD on 08/24/2023   09/12/2023 - 09/12/2023 Chemotherapy   Patient is on Treatment Plan : LUNG NSCLC Carboplatin  (6) + Paclitaxel (200) + Pembrolizumab (200) D1 q21d x 4 cycles / Pembrolizumab (200) Maintenance D1 q21d     11/21/2023 -  Chemotherapy   Patient is on Treatment Plan : LUNG NSCLC Pemetrexed  + Carboplatin  q21d x 4 Cycles         Interval history-: Discussed the use of AI scribe software for clinical note transcription with the patient, who gave verbal consent to proceed.   Rebecca Ochoa is a 63 y.o. female with oncologic history as above presenting to [facility] today with chief complaint of elevated heart rate and shortness  of breath. Patient is accompanied by family member who provides additional history.  For the last x 3 days she has experienced an increase in her resting heart rate, which has been around 125 bpm even after prolonged sitting. She monitors her heart rate at home, noting that it was previously in the 90s to 110s. She has  visited the ER multiple times for fast heart rate and shortness of breath, but is not established with a cardiologist currently. She does not feel her heart racing and only notices the high heart rate when checking it with her home pulse ox. She admits that shortness of breath that worsens with activity. This symptom has been persistent, with a slight worsening over the past week. No chest pain, wheezing, or significant coughing is present.  She has intermittent diarrhea, with episodes on January 31st, February 1st, and February 4th, managed with Imodium  as needed. No more than 2 episodes per day. Her fluid intake includes approximately 36 ounces of water and ice daily, along with 8 oz-ounce Body Armor drink. Her urine is not dark. Her appetite is reduced, and she feels full quickly, especially with protein shakes like Ensure, which she tried in the hospital. She was previously on prednisone  for a rash, which has mostly resolved, though some itching persists on her ear and the back of her head. She is no longer taking prednisone . She has been off potassium supplements since January 31st due to a prescription issue.    ROS  All other systems are reviewed and are negative for acute change except as noted in the HPI.    Allergies  Allergen Reactions   Biaxin [Clarithromycin] Nausea Only     Past Medical History:  Diagnosis Date   Anemia    Asthma, mild intermittent, well-controlled    GERD (gastroesophageal reflux disease)    Hx of migraines    Hyperlipidemia    Hypertension    Vitamin D  deficiency      Past Surgical History:  Procedure Laterality Date   BREAST SURGERY     reduction   BRONCHIAL BIOPSY  08/08/2023   Procedure: BRONCHIAL BIOPSIES;  Surgeon: Shelah Lamar RAMAN, MD;  Location: [facility] ENDOSCOPY;  Service: Pulmonary;;   BRONCHIAL BRUSHINGS  08/08/2023   Procedure: BRONCHIAL BRUSHINGS;  Surgeon: Shelah Lamar RAMAN, MD;  Location: [facility] ENDOSCOPY;  Service: Pulmonary;;   BRONCHIAL NEEDLE  ASPIRATION BIOPSY  08/08/2023   Procedure: BRONCHIAL NEEDLE ASPIRATION BIOPSIES;  Surgeon: Shelah Lamar RAMAN, MD;  Location: [facility] ENDOSCOPY;  Service: Pulmonary;;   ENDOBRONCHIAL ULTRASOUND Bilateral 08/08/2023   Procedure: ENDOBRONCHIAL ULTRASOUND;  Surgeon: Shelah Lamar RAMAN, MD;  Location: [facility] ENDOSCOPY;  Service: Pulmonary;  Laterality: Bilateral;   HEMOSTASIS CONTROL  08/08/2023   Procedure: HEMOSTASIS CONTROL;  Surgeon: Shelah Lamar RAMAN, MD;  Location: [facility] ENDOSCOPY;  Service: Pulmonary;;   IR IMAGING GUIDED PORT INSERTION  09/01/2023   REDUCTION MAMMAPLASTY     TUBAL LIGATION      Social History   Socioeconomic History   Marital status: Domestic Partner    Spouse name: Not on file   Number of children: Not on file   Years of education: Not on file   Highest education level: Not on file  Occupational History   Not on file  Tobacco Use   Smoking status: Former    Types: E-cigarettes   Smokeless tobacco: Never   Tobacco comments:    Quit in October 2024.  11/09/2023 hfb  Vaping Use   Vaping status: Every Day  Substance and Sexual Activity   Alcohol use: Yes    Alcohol/week: 3.0 standard drinks of alcohol    Types: 3 Standard drinks or equivalent per week   Drug use: Never   Sexual activity: Not on file  Other Topics Concern   Not on file  Social History Narrative   Not on file   Social Drivers of Health   Financial Resource Strain: Not on file  Food Insecurity: No Food Insecurity (11/10/2023)   Hunger Vital Sign    Worried About Running Out of Food in the Last Year: Never true    Ran Out of Food in the Last Year: Never true  Transportation Needs: No Transportation Needs (11/10/2023)   PRAPARE - Administrator, Civil Service (Medical): No    Lack of Transportation (Non-Medical): No  Recent Concern: Transportation Needs - Unmet Transportation Needs (10/17/2023)   PRAPARE - Administrator, Civil Service (Medical): Yes    Lack of Transportation  (Non-Medical): No  Physical Activity: Not on file  Stress: Not on file  Social Connections: Not on file  Intimate Partner Violence: Not At Risk (11/10/2023)   Humiliation, Afraid, Rape, and Kick questionnaire    Fear of Current or Ex-Partner: No    Emotionally Abused: No    Physically Abused: No    Sexually Abused: No    Family History  Problem Relation Age of Onset   Heart disease Father    Hypertension Father    Diabetes Father    Kidney disease Father    Cancer Brother      Current Outpatient Medications:    magnesium  oxide (MAG-OX) 400 (240 Mg) MG tablet, Take 1 tablet (400 mg total) by mouth daily for 7 days., Disp: 7 tablet, Rfl: 0   potassium chloride  SA (KLOR-CON  M) 20 MEQ tablet, Take 1 tablet (20 mEq total) by mouth daily for 7 days., Disp: 7 tablet, Rfl: 0   acetaminophen  (TYLENOL ) 500 MG tablet, Take 1,000 mg by mouth every 8 (eight) hours as needed for mild pain., Disp: , Rfl:    albuterol  (PROVENTIL ) (2.5 MG/3ML) 0.083% nebulizer solution, Take 3 mLs (2.5 mg total) by nebulization every 4 (four) hours as needed for wheezing or shortness of breath. (Patient not taking: Reported on 11/09/2023), Disp: 75 mL, Rfl: 12   ALPRAZolam  (XANAX ) 0.5 MG tablet, Take 1 tablet (0.5 mg total) by mouth 3 (three) times daily as needed for anxiety., Disp: 60 tablet, Rfl: 0   apixaban  (ELIQUIS ) 5 MG TABS tablet, Take 1 tablet (5 mg total) by mouth 2 (two) times daily., Disp: 60 tablet, Rfl: 1   clindamycin  (CLINDAGEL) 1 % gel, Apply topically 2 (two) times daily., Disp: 30 g, Rfl: 0   cyanocobalamin  (VITAMIN B12) 500 MCG tablet, Take 1 tablet (500 mcg total) by mouth daily., Disp: 100 tablet, Rfl: 0   folic acid  (FOLVITE ) 1 MG tablet, Take 1 tablet (1 mg total) by mouth daily., Disp: 30 tablet, Rfl: 2   HYDROcodone -acetaminophen  (NORCO/VICODIN) 5-325 MG tablet, Take 1 tablet by mouth every 6 (six) hours as needed. (Patient taking differently: Take 1 tablet by mouth every 6 (six) hours as  needed (for pain).), Disp: 21 tablet, Rfl: 0   loperamide  (IMODIUM ) 2 MG capsule, Take 1 capsule (2 mg total) by mouth as needed for diarrhea or loose stools., Disp: 30 capsule, Rfl: 0   methylPREDNISolone  (MEDROL  DOSEPAK) 4 MG TBPK tablet, Take 6 pills by mouth day 1, 5 on day 2,  4 on day 3, 3 on day 4, 2 on day 5, 1 on day 6, Disp: 21 tablet, Rfl: 0   nystatin  (MYCOSTATIN /NYSTOP ) powder, Apply 1 Application topically 3 (three) times daily. Apply to under breast for rash if needed., Disp: 15 g, Rfl: 0   ondansetron  (ZOFRAN ) 4 MG tablet, Take 1 tablet (4 mg total) by mouth every 6 (six) hours as needed for nausea., Disp: 20 tablet, Rfl: 0   ondansetron  (ZOFRAN ) 8 MG tablet, Take 8 mg by mouth every 8 (eight) hours as needed for nausea or vomiting., Disp: , Rfl:    pantoprazole  (PROTONIX ) 40 MG tablet, Take 1 tablet (40 mg total) by mouth 2 (two) times daily., Disp: 60 tablet, Rfl: 0   predniSONE  (DELTASONE ) 10 MG tablet, Take 0.5 tablets (5 mg total) by mouth daily with breakfast., Disp: 30 tablet, Rfl: 1   prochlorperazine  (COMPAZINE ) 10 MG tablet, Take 10 mg by mouth every 6 (six) hours as needed for nausea or vomiting., Disp: , Rfl:    TAGRISSO  80 MG tablet, TAKE 1 TABLET DAILY, Disp: 30 tablet, Rfl: 3   temazepam  (RESTORIL ) 30 MG capsule, Take 1 capsule (30 mg total) by mouth at bedtime as needed for sleep., Disp: 30 capsule, Rfl: 0  PHYSICAL EXAM: ECOG FS:2 - Symptomatic, <50% confined to bed    Vitals:   12/06/23 0930 12/06/23 1050 12/06/23 1100 12/06/23 1120  BP: 113/81     Pulse: (!) 132 (!) 160 (!) 132 (!) 123  Resp: 16     Temp: 97.9 F (36.6 C)     TempSrc: Temporal     SpO2: 96% 92% 98% 98%  Weight: 138 lb 8 oz (62.8 kg)     Height: 5' 5 (1.651 m)      Physical Exam Vitals and nursing note reviewed.  Constitutional:      Appearance: She is not ill-appearing or toxic-appearing.  HENT:     Head: Normocephalic.  Eyes:     Conjunctiva/sclera: Conjunctivae normal.   Cardiovascular:     Rate and Rhythm: Normal rate and regular rhythm.     Pulses: Normal pulses.     Heart sounds: Normal heart sounds.  Pulmonary:     Effort: Pulmonary effort is normal.     Breath sounds: Normal breath sounds.  Abdominal:     General: There is no distension.  Musculoskeletal:        General: Normal range of motion.     Cervical back: Normal range of motion.     Right lower leg: No edema.     Left lower leg: No edema.  Skin:    General: Skin is warm and dry.  Neurological:     Mental Status: She is alert.        LABORATORY DATA: I have reviewed the data as listed    Latest Ref Rng & Units 12/06/2023    9:04 AM 11/30/2023    8:17 AM 11/27/2023    3:48 PM  CBC  WBC 4.0 - 10.5 K/uL 2.2  2.8  2.0   Hemoglobin 12.0 - 15.0 g/dL 9.1  9.6  7.2   Hematocrit 36.0 - 46.0 % 29.2  30.4  23.1   Platelets 150 - 400 K/uL 103  74  96         Latest Ref Rng & Units 12/06/2023    9:04 AM 11/30/2023    8:17 AM 11/27/2023    3:48 PM  CMP  Glucose 70 - 99 mg/dL 884  93  106   BUN 8 - 23 mg/dL 9  8  8    Creatinine 0.44 - 1.00 mg/dL 9.37  9.40  9.50   Sodium 135 - 145 mmol/L 137  138  135   Potassium 3.5 - 5.1 mmol/L 3.2  3.6  3.9   Chloride 98 - 111 mmol/L 99  102  101   CO2 22 - 32 mmol/L 31  29  31    Calcium 8.9 - 10.3 mg/dL 9.3  9.2  8.8   Total Protein 6.5 - 8.1 g/dL 6.5  6.4  5.9   Total Bilirubin 0.0 - 1.2 mg/dL 0.7  0.7  0.5   Alkaline Phos 38 - 126 U/L 114  130  115   AST 15 - 41 U/L 16  28  28    ALT 0 - 44 U/L 15  25  21         RADIOGRAPHIC STUDIES (from last 24 hours if applicable) I have personally reviewed the radiological images as listed and agreed with the findings in the report. No results found.      Visit Diagnosis: 1. Tachycardia   2. 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 Other pancytopenia (HCC)   4. Hypokalemia   5. Hypomagnesemia      Orders Placed This Encounter  Procedures   Ambulatory  referral to Cardiology    Referral Priority:   Urgent    Referral Type:   Consultation    Referral Reason:   Specialty Services Required    Number of Visits Requested:   1   Check Pulse Oximetry while ambulating    Standing Status:   Standing    Number of Occurrences:   1   EKG 12-Lead    All questions were answered. The patient knows to call the clinic with any problems, questions or concerns. No barriers to learning was detected.  A total of more than 30 minutes were spent on this encounter with face-to-face time and non-face-to-face time, including preparing to see the patient, ordering tests and/or medications, counseling the patient and coordination of care as outlined above.    Thank you for allowing me to participate in the care of this patient.    Jane Broughton E  Walisiewicz, PA-C Department of Hematology/Oncology [facility] Phone: [phone]  [phone]    12/06/2023 11:52 AM

## 2023-12-06 NOTE — Progress Notes (Signed)
 Nutrition Follow-up:  Pt with stage IV NSCLC (diagnosed 08/2023). S/p palliative radiation to large hilar and mediastinal mass under the care of Dr. Shannon (final RT 11/4). She is currently receiving Tagrisso  80 mg/day. Patient started concurrent systemic chemotherapy with carboplatin  + alimta  on 1/21. She is under the care of Dr. Sherrod.  Met with pt in [facility]. She is receiving IV fluids and pending EKG for new onset tachycardia. Patient reports daily taste changes which makes oral intake challenging. Patient has not tried baking soda salt water gargle previously discussed. Patient reports trying the CIB mixed with milk. This was ok, but does not think she could drink everyday. Patient had reported dislike of ensure/boost, however today she reports feeling full after consuming, but tolerates fine. Patient denies nausea, vomiting, diarrhea, constipation.    Medications: methylprednisolone , nystatin   Labs: Hgb 9.1, K 3.2, albumin 3.4, Mg 1.4  Anthropometrics: Wt 138 lb 8 oz today   1/30 - 139 lb 6.4 oz 1/21 - 143 lb 8 oz  12/31 - 147 lb 1.6 oz   NUTRITION DIAGNOSIS: Unintended wt loss continues    MALNUTRITION DIAGNOSIS: Severe malnutrition continues    INTERVENTION:  Reviewed strategies for taste changes, encouraged pt to try baking soda/salt water rinses before eating - copy of handout with recipe provided Encouraged daily Ensure Complete/equivalent, suggested splitting into 2-3 small servings and drinking in between meals/snacks - samples + coupons provided  Encourage high calorie high protein foods    MONITORING, EVALUATION, GOAL: wt trends, intake    NEXT VISIT: Tuesday March 4 during infusion

## 2023-12-07 NOTE — Progress Notes (Signed)
 Cardiology Office Note:    Date:  12/07/2023   ID:  Rebecca Ochoa, DOB 1961/05/10, MRN [id number]  PCP:  Tonita Fallow, MD   [facility] Providers Cardiologist:  Alvan Ronal BRAVO, MD     Referring MD: Tonita Fallow, MD   No chief complaint on file.  PACs  History of Present Illness:    Rebecca Ochoa is a 63 y.o. female with a hx below, referral for sinus rhythm with PACs.  No ischemic  ST segment changes. Minimal PVCs. Some palpitations. No syncope.  No sudden cardiac death family hx/no known coronary dx. No caffeine . 2 glasses of wine. No CP/SOB. Vapes, no smoking.  Interim hx 2/7 She was recently diagnosed with stage IVb non-small cell lung cancer.  She has a prolonged QTc of 583; therefore osimertinib  was held after 4 doses.  She also had sinus tachycardia.  Chest CTA was negative for PE on 11/09/2023.  She was seen in clinic by her oncologist on 12/06/2023 and she was significantly tachycardic up to the 160s just with ambulation.  It was suggested that she go to the emergency room however she chose to be seen by cardiology outpatient.  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   Past Medical History:  Diagnosis Date   Anemia    Asthma, mild intermittent, well-controlled    GERD (gastroesophageal reflux disease)    Hx of migraines    Hyperlipidemia    Hypertension    Vitamin D  deficiency     Past Surgical History:  Procedure Laterality Date   BREAST SURGERY     reduction   BRONCHIAL BIOPSY  08/08/2023   Procedure: BRONCHIAL BIOPSIES;  Surgeon: Shelah Lamar RAMAN, MD;  Location: [facility] ENDOSCOPY;  Service: Pulmonary;;   BRONCHIAL BRUSHINGS  08/08/2023   Procedure: BRONCHIAL BRUSHINGS;  Surgeon: Shelah Lamar RAMAN, MD;  Location: [facility] ENDOSCOPY;  Service: Pulmonary;;   BRONCHIAL NEEDLE ASPIRATION BIOPSY  08/08/2023   Procedure: BRONCHIAL NEEDLE ASPIRATION BIOPSIES;  Surgeon: Shelah Lamar RAMAN, MD;  Location: [facility] ENDOSCOPY;  Service: Pulmonary;;   ENDOBRONCHIAL ULTRASOUND  Bilateral 08/08/2023   Procedure: ENDOBRONCHIAL ULTRASOUND;  Surgeon: Shelah Lamar RAMAN, MD;  Location: [facility] ENDOSCOPY;  Service: Pulmonary;  Laterality: Bilateral;   HEMOSTASIS CONTROL  08/08/2023   Procedure: HEMOSTASIS CONTROL;  Surgeon: Shelah Lamar RAMAN, MD;  Location: [facility] ENDOSCOPY;  Service: Pulmonary;;   IR IMAGING GUIDED PORT INSERTION  09/01/2023   REDUCTION MAMMAPLASTY     TUBAL LIGATION      Current Medications: No outpatient medications have been marked as taking for the 12/08/23 encounter (Appointment) with Alvan Ronal BRAVO, MD.     Allergies:   Biaxin [clarithromycin]   Social History   Socioeconomic History   Marital status: Domestic Partner    Spouse name: Not on file   Number of children: Not on file   Years of education: Not on file   Highest education level: Not on file  Occupational History   Not on file  Tobacco Use   Smoking status: Former    Types: E-cigarettes   Smokeless tobacco: Never   Tobacco comments:    Quit in October 2024.  11/09/2023 hfb  Vaping Use   Vaping status: Every Day  Substance and Sexual Activity   Alcohol use: Yes    Alcohol/week: 3.0 standard drinks of alcohol    Types: 3 Standard drinks or equivalent per week   Drug use: Never   Sexual activity: Not on file  Other Topics Concern   Not  on file  Social History Narrative   Not on file   Social Drivers of Health   Financial Resource Strain: Not on file  Food Insecurity: No Food Insecurity (11/10/2023)   Hunger Vital Sign    Worried About Running Out of Food in the Last Year: Never true    Ran Out of Food in the Last Year: Never true  Transportation Needs: No Transportation Needs (11/10/2023)   PRAPARE - Administrator, Civil Service (Medical): No    Lack of Transportation (Non-Medical): No  Recent Concern: Transportation Needs - Unmet Transportation Needs (10/17/2023)   PRAPARE - Administrator, Civil Service (Medical): Yes    Lack of Transportation  (Non-Medical): No  Physical Activity: Not on file  Stress: Not on file  Social Connections: Not on file     Family History: The patient's family history includes Cancer in her brother; Diabetes in her father; Heart disease in her father; Hypertension in her father; Kidney disease in her father.  ROS:   Please see the history of present illness.     All other systems reviewed and are negative.  EKGs/Labs/Other Studies Reviewed:    The following studies were reviewed today:   EKG:  EKG is  ordered today.  The ekg ordered today demonstrates   10/14/2022- NSR with PACs with PVCs  Recent Labs: 10/17/2023: B Natriuretic Peptide 84.8 11/09/2023: TSH 0.213 12/06/2023: ALT 15; BUN 9; Creatinine 0.62; Hemoglobin 9.1; Magnesium  1.4; Platelet Count 103; Potassium 3.2; Sodium 137  Recent Lipid Panel    Component Value Date/Time   CHOL 139 10/18/2023 1243   TRIG 118 09/24/2023 0500   HDL 31 (L) 09/24/2023 0500   CHOLHDL 3.9 09/24/2023 0500   VLDL 24 09/24/2023 0500   LDLCALC 67 09/24/2023 0500   LDLCALC 90 10/12/2022 1442     Risk Assessment/Calculations:         Physical Exam:    VS:  Vitals:   12/08/23 0756  BP: (!) 90/56  Pulse: (!) 130    Wt Readings from Last 3 Encounters:  12/06/23 138 lb 8 oz (62.8 kg)  11/30/23 139 lb 6.4 oz (63.2 kg)  11/27/23 140 lb 3.2 oz (63.6 kg)     GEN:  Well nourished, well developed in no acute distress HEENT: Normal NECK: No JVD; No carotid bruits LYMPHATICS: No lymphadenopathy CARDIAC: RRR, no murmurs, rubs, gallops RESPIRATORY:  Clear to auscultation without rales, wheezing or rhonchi  ABDOMEN: Soft, non-tender, non-distended MUSCULOSKELETAL:  No edema; No deformity  SKIN: Warm and dry NEUROLOGIC:  Alert and oriented x 3 PSYCHIATRIC:  Normal affect   ASSESSMENT:   Cardio oncology  Cancer therapy induced prolonged Qtc I saw initially for minimal ectopy.  She was diagnosed with non-small cell lung cancer in early October and  started osimertinib .  Her QTc is prolonged to greater than 500 ms.  This is associated with this cancer therapy agent. -holding tagrisso ; consider alternative cancer therapy if feasible  Sinus tachycardia Echo is normal. Prior CT in January showed no PE. Likely in the setting of low potassium and magnesium .  Furthermore she may be dehydrated.  Also cancer itself can impact sympathetic activity.  She is also anemic with a hemoglobin of 9, this contributes as well. -Can give IV fluids with therapy -replete K>4, Mg>2 -Can transfuse Hgb>7  History of submassive PE Continue Eliquis  Platelets are stable 103. PLAN:    In order of problems listed above:  Follow up in 3  months with APP: please transition to Odis Brownie       Medication Adjustments/Labs and Tests Ordered: Current medicines are reviewed at length with the patient today.  Concerns regarding medicines are outlined above.  No orders of the defined types were placed in this encounter.  No orders of the defined types were placed in this encounter.   There are no Patient Instructions on file for this visit.   Signed, Alvan Ronal BRAVO, MD  12/07/2023 1:45 PM    [facility]

## 2023-12-08 ENCOUNTER — Ambulatory Visit: Payer: No Typology Code available for payment source | Attending: Internal Medicine | Admitting: Internal Medicine

## 2023-12-08 ENCOUNTER — Encounter: Payer: Self-pay | Admitting: Internal Medicine

## 2023-12-08 VITALS — BP 90/56 | HR 130 | Ht 65.0 in | Wt 137.4 lb

## 2023-12-08 DIAGNOSIS — I10 Essential (primary) hypertension: Secondary | ICD-10-CM | POA: Diagnosis not present

## 2023-12-08 DIAGNOSIS — I959 Hypotension, unspecified: Secondary | ICD-10-CM | POA: Diagnosis not present

## 2023-12-08 DIAGNOSIS — I2699 Other pulmonary embolism without acute cor pulmonale: Secondary | ICD-10-CM | POA: Diagnosis not present

## 2023-12-08 NOTE — Patient Instructions (Signed)
 Medication Instructions:  No changes  *If you need a refill on your cardiac medications before your next appointment, please call your pharmacy*   Lab Work: No changes  If you have labs (blood work) drawn today and your tests are completely normal, you will receive your results only by: MyChart Message (if you have MyChart) OR A paper copy in the mail If you have any lab test that is abnormal or we need to change your treatment, we will call you to review the results.   Testing/Procedures: None    Follow-Up: At [facility], you and your health needs are our priority.  As part of our continuing mission to provide you with exceptional heart care, we have created designated Provider Care Teams.  These Care Teams include your primary Cardiologist (physician) and Advanced Practice Providers (APPs -  Physician Assistants and Nurse Practitioners) who all work together to provide you with the care you need, when you need it.   Your next appointment:   3 month(s)  Provider:   With any APP   Other Instructions

## 2023-12-11 NOTE — Progress Notes (Signed)
Palliative Medicine [facility]  Telephone:([phone] Fax:([phone]) [phone]   Name: [name] Date: 12/11/2023 MRN: [id number]  DOB: Jun 12, 1961  Patient Care Team: [name], MD as PCP - General (Internal Medicine) Wyline Mood Alben Spittle, MD as PCP - Cardiology (Cardiology) Hanley Seamen Dustin Folks, MD as Referring Physician (Optometry) Ilda Mori, MD as Consulting Physician (Obstetrics and Gynecology) Arminda Resides, MD as Consulting Physician (Dermatology) Lytle Butte, RN as Registered Nurse [name], MD as Referring Physician (Internal Medicine) Pickenpack-Cousar, Arty Baumgartner, NP as Nurse Practitioner (Hospice and Palliative Medicine)    INTERVAL HISTORY: Rebecca Ochoa is a 63 y.o. female with oncologic medical history including non-small cell lung cancer (08/2023) and innumerable hypermetabolic pulmonary metastatic lesions in addition to liver and bone metastasis as well as abdominal celiac axis lymphadenopathy.  Palliative ask to see for symptom management and goals of care.   SOCIAL HISTORY:     reports that she has quit smoking. Her smoking use included e-cigarettes. She has never used smokeless tobacco. She reports current alcohol use of about 3.0 standard drinks of alcohol per week. She reports that she does not use drugs.  ADVANCE DIRECTIVES:  None on file  CODE STATUS: Full code  PAST MEDICAL HISTORY: Past Medical History:  Diagnosis Date   Anemia    Asthma, mild intermittent, well-controlled    GERD (gastroesophageal reflux disease)    Hx of migraines    Hyperlipidemia    Hypertension    Vitamin D deficiency     ALLERGIES:  is allergic to biaxin [clarithromycin].  MEDICATIONS:  Current Outpatient Medications  Medication Sig Dispense Refill   acetaminophen (TYLENOL) 500 MG tablet Take 1,000 mg by mouth every 8 (eight) hours as needed for mild pain.     albuterol (PROVENTIL) (2.5 MG/3ML) 0.083% nebulizer solution Take 3 mLs (2.5  mg total) by nebulization every 4 (four) hours as needed for wheezing or shortness of breath. 75 mL 12   ALPRAZolam (XANAX) 0.5 MG tablet Take 1 tablet (0.5 mg total) by mouth 3 (three) times daily as needed for anxiety. 60 tablet 0   apixaban (ELIQUIS) 5 MG TABS tablet Take 1 tablet (5 mg total) by mouth 2 (two) times daily. 60 tablet 1   clindamycin (CLINDAGEL) 1 % gel Apply topically 2 (two) times daily. 30 g 0   cyanocobalamin (VITAMIN B12) 500 MCG tablet Take 1 tablet (500 mcg total) by mouth daily. 100 tablet 0   folic acid (FOLVITE) 1 MG tablet Take 1 tablet (1 mg total) by mouth daily. 30 tablet 2   HYDROcodone-acetaminophen (NORCO/VICODIN) 5-325 MG tablet Take 1 tablet by mouth every 6 (six) hours as needed. (Patient taking differently: Take 1 tablet by mouth every 6 (six) hours as needed (for pain).) 21 tablet 0   loperamide (IMODIUM) 2 MG capsule Take 1 capsule (2 mg total) by mouth as needed for diarrhea or loose stools. 30 capsule 0   magnesium oxide (MAG-OX) 400 (240 Mg) MG tablet Take 1 tablet (400 mg total) by mouth daily for 7 days. 7 tablet 0   methylPREDNISolone (MEDROL DOSEPAK) 4 MG TBPK tablet Take 6 pills by mouth day 1, 5 on day 2, 4 on day 3, 3 on day 4, 2 on day 5, 1 on day 6 21 tablet 0   nystatin (MYCOSTATIN/NYSTOP) powder Apply 1 Application topically 3 (three) times daily. Apply to under breast for rash if needed. 15 g 0   ondansetron (ZOFRAN) 4 MG tablet Take 1  tablet (4 mg total) by mouth every 6 (six) hours as needed for nausea. 20 tablet 0   ondansetron (ZOFRAN) 8 MG tablet Take 8 mg by mouth every 8 (eight) hours as needed for nausea or vomiting.     pantoprazole (PROTONIX) 40 MG tablet Take 1 tablet (40 mg total) by mouth 2 (two) times daily. 60 tablet 0   potassium chloride SA (KLOR-CON M) 20 MEQ tablet Take 1 tablet (20 mEq total) by mouth daily for 7 days. 7 tablet 0   predniSONE (DELTASONE) 10 MG tablet Take 0.5 tablets (5 mg total) by mouth daily with breakfast.  30 tablet 1   prochlorperazine (COMPAZINE) 10 MG tablet Take 10 mg by mouth every 6 (six) hours as needed for nausea or vomiting.     TAGRISSO 80 MG tablet TAKE 1 TABLET DAILY 30 tablet 3   temazepam (RESTORIL) 30 MG capsule Take 1 capsule (30 mg total) by mouth at bedtime as needed for sleep. 30 capsule 0   No current facility-administered medications for this visit.    VITAL SIGNS: There were no vitals taken for this visit. There were no vitals filed for this visit.  Estimated body mass index is 22.86 kg/m as calculated from the following:   Height as of 12/08/23: 5\' 5"  (1.651 m).   Weight as of 12/08/23: 137 lb 6.4 oz (62.3 kg).   PERFORMANCE STATUS (ECOG) : 1 - Symptomatic but completely ambulatory   Physical Exam General: NAD Cardiovascular: regular rate and rhythm Pulmonary: clear ant fields Abdomen: soft, nontender, + bowel sounds Extremities: no edema, no joint deformities Skin: no rashes Neurological: AAO x3  Discussed the use of AI scribe software for clinical note transcription with the patient, who gave verbal consent to proceed.   IMPRESSION:  Rebecca Ochoa presents to clinic today for follow-up. No acute distress. Her appetite is generally good, but she eats only small amounts before feeling full. She feels hungry but quickly becomes satiated after eating a little. Weight remains stable at 137lbs. Her husband is present. She feels tired and weak, attributing this to her ongoing treatment and ongoing anemia. Per discussions with oncology team plans for blood transfusion in the next several days.  She reports receiving three to four blood transfusions in the past, with two occurring in the hospital and one at the current facility. No blood in urine or stools is noted. Denies concerns with nausea, vomiting, constipation, or diarrhea.   Her sleep has improved with the use of Restoril, allowing her to sleep well and wake up around 3-4 AM, after which she is able to return  to sleep. No pain is reported, except for occasional stomach discomfort. Sadly, her PCP Dr. Valetta Fuller recently passed away and they have been informed the practice will be closing. She is hopeful to find another PCP as this will be a change for her after seeing him for more than 30 years. Syndi is on alprazolam as needed for anxiety. Reports taking maybe once daily. Will refill given situation with PCP.   She continues to experience an elevated heart rate, reaching 140-145 bpm after mild activities such as getting up to use the restroom. Two weeks ago, her heart rate reached the 160s after walking down a hall. No dizziness, lightheadedness, palpitations, or jitteriness accompany these episodes. Patient was seen by Cassie, PA and recently followed up with Cardiology for these concerns.  All questions answered and support provided.  I discussed the importance of continued conversation with family  and their medical providers regarding overall plan of care and treatment options, ensuring decisions are within the context of the patients values and GOCs.  Patient expressed understanding and was in agreement with this plan. She also understands that She can call the clinic at any time with any questions, concerns, or complaints.   Assessment and Plan  Tachycardia Persistent elevated heart rate, even with mild activity. No associated symptoms such as dizziness, lightheadedness, or palpitations. -Continue monitoring heart rate. -Cassie, PA is aware. Recent visit with Cardiologist.  -Patient aware to hold zofran as directed due to Qtc prolongation.   Cancer Treatment Undergoing treatment with Tagrisso, which may be changed due to Qtc prolongation. Infusion amount to be reduced today per oncology. -Continue with adjusted treatment plan as directed by oncology team.  Anemia Possible need for blood transfusion due to declining hemoglobin levels. No signs of bleeding. -Consider blood transfusion as directed  by oncology team. -Patient reports awareness to complete stool cards.   Insomnia Reports good sleep with Restoril. -Refill Restoril prescription.  Anxiety Taking Alprazolam 0.5mg  once daily. -Refill Alprazolam prescription.  General Health Maintenance -I will plan to see patient back in 3-4 weeks. Sooner if needed.  Any controlled substances utilized were prescribed in the context of palliative care. PDMP has been reviewed.   Visit consisted of counseling and education dealing with the complex and emotionally intense issues of symptom management and palliative care in the setting of serious and potentially life-threatening illness.  Willette Alma, AGPCNP-BC  Palliative Medicine Team/[facility]

## 2023-12-12 ENCOUNTER — Other Ambulatory Visit: Payer: No Typology Code available for payment source

## 2023-12-12 ENCOUNTER — Inpatient Hospital Stay: Payer: No Typology Code available for payment source

## 2023-12-12 ENCOUNTER — Ambulatory Visit: Payer: No Typology Code available for payment source | Admitting: Physician Assistant

## 2023-12-12 ENCOUNTER — Ambulatory Visit: Payer: No Typology Code available for payment source

## 2023-12-12 ENCOUNTER — Encounter: Payer: No Typology Code available for payment source | Admitting: Dietician

## 2023-12-12 MED FILL — Fosaprepitant Dimeglumine For IV Infusion 150 MG (Base Eq): INTRAVENOUS | Qty: 5 | Status: AC

## 2023-12-12 NOTE — Progress Notes (Unsigned)
[facility] OFFICE PROGRESS NOTE  [name], MD [address] [place] [address]  DIAGNOSIS: Stage IVB ( T3, N2, M1c) non-Small Cell Lung Cancer, poorly differentiated squamous cell carcinoma presented with large right upper lobe lung mass in addition to large necrotic right hilar/mediastinal mass and innumerable hypermetabolic pulmonary metastatic lesions in addition to liver and bone metastasis as well as abdominal celiac axis lymphadenopathy diagnosed in October 2024.    PDL1: 0%   Molecular Studies: Positive for EGFR G719A  PRIOR THERAPY: Palliative radiotherapy to the large hilar and mediastinal mass under the care of Dr. Roselind Messier last dose on 09/04/23   CURRENT THERAPY: ***prolonged QT***Tagrisso 80 mg p.o. daily. She will start concurrent systemic chemotherapy with carboplatin for AUC of 5 and Alimta 500 Mg/M2 on November 14, 2023. Status post 1 cycle. Starting from cycle #2 ***  INTERVAL HISTORY: Rebecca Ochoa 63 y.o. female returns to clinic today for follow-up visit.  The patient was last seen in the clinic by Dr. Arbutus Ped on 11/30/2023.  She is currently undergoing targeted treatment with Tagrisso in addition to systemic chemotherapy IV with carboplatin and Alimta.  She underwent her first cycle of treatment on 11/14/2023.  2 days following treatment she began to have significant fatigue and low appetite.  She also had onset of itching for which she received Solu-Medrol and tachycardia.  Also was on Eliquis and had epistaxis.  She previously had history of submassive PE during hospitalization in early November 2024 she has been struggling with anemia and requiring blood transfusions.  Dr. Arbutus Ped recommended a humidifier and saline for the epistaxis.  She was then seen in the symptom management on 12/06/23 for tachycardia.  She also was reporting diarrhea.  She is trying to hydrate well with Body Armor and water daily.  He is feeling full with protein  shakes like Ensure.  Also is having cytopenias.  He had an EKG that showed prolonged QT at 583.  Therefore her Edgar Frisk is currently on hold. ***repeat EKG. the patient was referred to cardiology and she had an appointment on 12/08/2023.  Since last being seen, she is feeling ***.  Today.  She denies any fever, chills, or night sweats.  Her appetite continues to be ***and her weight is ***.  Her symptoms see nutritionist?  She reports dyspnea on exertion.  Cough?  She denies any chest pain or hemoptysis.  She denies any nausea or vomiting.  Her diarrhea has ***.  Denies any constipation.  Her energy is ***.  Rash?  She denies any headache or visual changes.  She is here today for evaluation and repeat blood work before considering undergoing cycle #2.  MEDICAL HISTORY: Past Medical History:  Diagnosis Date   Anemia    Asthma, mild intermittent, well-controlled    GERD (gastroesophageal reflux disease)    Hx of migraines    Hyperlipidemia    Hypertension    Vitamin D deficiency     ALLERGIES:  is allergic to biaxin [clarithromycin].  MEDICATIONS:  Current Outpatient Medications  Medication Sig Dispense Refill   acetaminophen (TYLENOL) 500 MG tablet Take 1,000 mg by mouth every 8 (eight) hours as needed for mild pain.     albuterol (PROVENTIL) (2.5 MG/3ML) 0.083% nebulizer solution Take 3 mLs (2.5 mg total) by nebulization every 4 (four) hours as needed for wheezing or shortness of breath. 75 mL 12   ALPRAZolam (XANAX) 0.5 MG tablet Take 1 tablet (0.5 mg total) by mouth 3 (three)  times daily as needed for anxiety. 60 tablet 0   apixaban (ELIQUIS) 5 MG TABS tablet Take 1 tablet (5 mg total) by mouth 2 (two) times daily. 60 tablet 1   clindamycin (CLINDAGEL) 1 % gel Apply topically 2 (two) times daily. 30 g 0   cyanocobalamin (VITAMIN B12) 500 MCG tablet Take 1 tablet (500 mcg total) by mouth daily. 100 tablet 0   folic acid (FOLVITE) 1 MG tablet Take 1 tablet (1 mg total) by mouth daily. 30  tablet 2   HYDROcodone-acetaminophen (NORCO/VICODIN) 5-325 MG tablet Take 1 tablet by mouth every 6 (six) hours as needed. (Patient taking differently: Take 1 tablet by mouth every 6 (six) hours as needed (for pain).) 21 tablet 0   loperamide (IMODIUM) 2 MG capsule Take 1 capsule (2 mg total) by mouth as needed for diarrhea or loose stools. 30 capsule 0   magnesium oxide (MAG-OX) 400 (240 Mg) MG tablet Take 1 tablet (400 mg total) by mouth daily for 7 days. 7 tablet 0   methylPREDNISolone (MEDROL DOSEPAK) 4 MG TBPK tablet Take 6 pills by mouth day 1, 5 on day 2, 4 on day 3, 3 on day 4, 2 on day 5, 1 on day 6 21 tablet 0   nystatin (MYCOSTATIN/NYSTOP) powder Apply 1 Application topically 3 (three) times daily. Apply to under breast for rash if needed. 15 g 0   ondansetron (ZOFRAN) 4 MG tablet Take 1 tablet (4 mg total) by mouth every 6 (six) hours as needed for nausea. 20 tablet 0   ondansetron (ZOFRAN) 8 MG tablet Take 8 mg by mouth every 8 (eight) hours as needed for nausea or vomiting.     pantoprazole (PROTONIX) 40 MG tablet Take 1 tablet (40 mg total) by mouth 2 (two) times daily. 60 tablet 0   potassium chloride SA (KLOR-CON M) 20 MEQ tablet Take 1 tablet (20 mEq total) by mouth daily for 7 days. 7 tablet 0   predniSONE (DELTASONE) 10 MG tablet Take 0.5 tablets (5 mg total) by mouth daily with breakfast. 30 tablet 1   prochlorperazine (COMPAZINE) 10 MG tablet Take 10 mg by mouth every 6 (six) hours as needed for nausea or vomiting.     TAGRISSO 80 MG tablet TAKE 1 TABLET DAILY 30 tablet 3   temazepam (RESTORIL) 30 MG capsule Take 1 capsule (30 mg total) by mouth at bedtime as needed for sleep. 30 capsule 0   No current facility-administered medications for this visit.    SURGICAL HISTORY:  Past Surgical History:  Procedure Laterality Date   BREAST SURGERY     reduction   BRONCHIAL BIOPSY  08/08/2023   Procedure: BRONCHIAL BIOPSIES;  Surgeon: Leslye Peer, MD;  Location: [facility] ENDOSCOPY;   Service: Pulmonary;;   BRONCHIAL BRUSHINGS  08/08/2023   Procedure: BRONCHIAL BRUSHINGS;  Surgeon: Leslye Peer, MD;  Location: [facility] ENDOSCOPY;  Service: Pulmonary;;   BRONCHIAL NEEDLE ASPIRATION BIOPSY  08/08/2023   Procedure: BRONCHIAL NEEDLE ASPIRATION BIOPSIES;  Surgeon: Leslye Peer, MD;  Location: [facility] ENDOSCOPY;  Service: Pulmonary;;   ENDOBRONCHIAL ULTRASOUND Bilateral 08/08/2023   Procedure: ENDOBRONCHIAL ULTRASOUND;  Surgeon: Leslye Peer, MD;  Location: [facility] ENDOSCOPY;  Service: Pulmonary;  Laterality: Bilateral;   HEMOSTASIS CONTROL  08/08/2023   Procedure: HEMOSTASIS CONTROL;  Surgeon: Leslye Peer, MD;  Location: [facility] ENDOSCOPY;  Service: Pulmonary;;   IR IMAGING GUIDED PORT INSERTION  09/01/2023   REDUCTION MAMMAPLASTY     TUBAL LIGATION  REVIEW OF SYSTEMS:   Review of Systems  Constitutional: Negative for appetite change, chills, fatigue, fever and unexpected weight change.  HENT:   Negative for mouth sores, nosebleeds, sore throat and trouble swallowing.   Eyes: Negative for eye problems and icterus.  Respiratory: Negative for cough, hemoptysis, shortness of breath and wheezing.   Cardiovascular: Negative for chest pain and leg swelling.  Gastrointestinal: Negative for abdominal pain, constipation, diarrhea, nausea and vomiting.  Genitourinary: Negative for bladder incontinence, difficulty urinating, dysuria, frequency and hematuria.   Musculoskeletal: Negative for back pain, gait problem, neck pain and neck stiffness.  Skin: Negative for itching and rash.  Neurological: Negative for dizziness, extremity weakness, gait problem, headaches, light-headedness and seizures.  Hematological: Negative for adenopathy. Does not bruise/bleed easily.  Psychiatric/Behavioral: Negative for confusion, depression and sleep disturbance. The patient is not nervous/anxious.     PHYSICAL EXAMINATION:  There were no vitals taken for this visit.  ECOG PERFORMANCE STATUS: {CHL ONC ECOG  [id number]  Physical Exam  Constitutional: Oriented to person, place, and time and well-developed, well-nourished, and in no distress. No distress.  HENT:  Head: Normocephalic and atraumatic.  Mouth/Throat: Oropharynx is clear and moist. No oropharyngeal exudate.  Eyes: Conjunctivae are normal. Right eye exhibits no discharge. Left eye exhibits no discharge. No scleral icterus.  Neck: Normal range of motion. Neck supple.  Cardiovascular: Normal rate, regular rhythm, normal heart sounds and intact distal pulses.   Pulmonary/Chest: Effort normal and breath sounds normal. No respiratory distress. No wheezes. No rales.  Abdominal: Soft. Bowel sounds are normal. Exhibits no distension and no mass. There is no tenderness.  Musculoskeletal: Normal range of motion. Exhibits no edema.  Lymphadenopathy:    No cervical adenopathy.  Neurological: Alert and oriented to person, place, and time. Exhibits normal muscle tone. Gait normal. Coordination normal.  Skin: Skin is warm and dry. No rash noted. Not diaphoretic. No erythema. No pallor.  Psychiatric: Mood, memory and judgment normal.  Vitals reviewed.  LABORATORY DATA: Lab Results  Component Value Date   WBC 2.2 (L) 12/06/2023   HGB 9.1 (L) 12/06/2023   HCT 29.2 (L) 12/06/2023   MCV 86.6 12/06/2023   PLT 103 (L) 12/06/2023      Chemistry      Component Value Date/Time   NA 137 12/06/2023 0904   K 3.2 (L) 12/06/2023 0904   CL 99 12/06/2023 0904   CO2 31 12/06/2023 0904   BUN 9 12/06/2023 0904   CREATININE 0.62 12/06/2023 0904   CREATININE 1.05 10/12/2022 1442      Component Value Date/Time   CALCIUM 9.3 12/06/2023 0904   ALKPHOS 114 12/06/2023 0904   AST 16 12/06/2023 0904   ALT 15 12/06/2023 0904   BILITOT 0.7 12/06/2023 0904       RADIOGRAPHIC STUDIES:  No results found.   ASSESSMENT/PLAN:  This is a very pleasant 63 year old Caucasian female with stage IV (T3, N2, M1 C) non-small cell lung cancer, poorly  differentiated squamous cell carcinoma.  She presented with a large right upper lobe lung mass in addition to large necrotic right hilar and mediastinal mass and innumerable hypermetabolic pulmonary metastatic lesions in addition to liver and bone metastasis.  She also has abdominal celiac axis lymphadenopathy.  She was diagnosed in October 2024.  Her molecular studies show that she is negative for PD-L1 expression but positive for EGFR mutation G719A.  She underwent palliative radiation to the large hilar and mediastinal mass under the care of Dr. Roselind Messier.  The  last dose was on 11//24.  She was started on low 80 mg p.o. daily starting on 09/29/2023.  She is found to have prolonged QT at her office visit on 12/06/2023 and this has been on hold for about 1 week.  He is also on chemotherapy with carboplatin for an AUC of 5 and Alimta 500 mg/m which was started on 11/21/2023.  She status post 1 cycle.  She has been having some cytopenias, fatigue, decreased appetite with treatment.  The patient was seen with Dr. Arbutus Ped today.  Dr. Arbutus Ped recommends***using the dose of her chemotherapy to carboplatin for an AUC of 4 and Alimta 400 mg/m.  Additionally, for targeted treatment Dr. Arbutus Ped recommends ***Tarceva***???   He will continue to monitor her labs closely on a weekly basis and arrange for a blood transfusion if her hemoglobin is less than 8.  We will arrange for a repeat EKG today.   The patient was advised to call immediately if she has any concerning symptoms in the interval. The patient voices understanding of current disease status and treatment options and is in agreement with the current care plan. All questions were answered. The patient knows to call the clinic with any problems, questions or concerns. We can certainly see the patient much sooner if necessary   No orders of the defined types were placed in this encounter.    I spent {CHL ONC TIME VISIT - NWGNF:[id number]} counseling the  patient face to face. The total time spent in the appointment was {CHL ONC TIME VISIT - IONGE:[id number]}.  Shakiyla Kook L Cierah Crader, PA-C 12/12/23

## 2023-12-13 ENCOUNTER — Other Ambulatory Visit: Payer: Self-pay

## 2023-12-13 ENCOUNTER — Inpatient Hospital Stay: Payer: No Typology Code available for payment source

## 2023-12-13 ENCOUNTER — Inpatient Hospital Stay (HOSPITAL_BASED_OUTPATIENT_CLINIC_OR_DEPARTMENT_OTHER): Payer: No Typology Code available for payment source | Admitting: Physician Assistant

## 2023-12-13 ENCOUNTER — Encounter: Payer: Self-pay | Admitting: Internal Medicine

## 2023-12-13 ENCOUNTER — Encounter: Payer: Self-pay | Admitting: Nurse Practitioner

## 2023-12-13 ENCOUNTER — Telehealth: Payer: Self-pay | Admitting: Pharmacy Technician

## 2023-12-13 ENCOUNTER — Telehealth: Payer: Self-pay | Admitting: Pharmacist

## 2023-12-13 ENCOUNTER — Other Ambulatory Visit (HOSPITAL_COMMUNITY): Payer: Self-pay

## 2023-12-13 ENCOUNTER — Inpatient Hospital Stay (HOSPITAL_BASED_OUTPATIENT_CLINIC_OR_DEPARTMENT_OTHER): Payer: No Typology Code available for payment source | Admitting: Nurse Practitioner

## 2023-12-13 VITALS — BP 126/81 | HR 142 | Temp 97.8°F | Resp 18 | Wt 137.0 lb

## 2023-12-13 VITALS — BP 111/70 | HR 100 | Temp 97.8°F | Resp 18

## 2023-12-13 DIAGNOSIS — Z7189 Other specified counseling: Secondary | ICD-10-CM | POA: Insufficient documentation

## 2023-12-13 DIAGNOSIS — Z95828 Presence of other vascular implants and grafts: Secondary | ICD-10-CM

## 2023-12-13 DIAGNOSIS — Z515 Encounter for palliative care: Secondary | ICD-10-CM | POA: Diagnosis not present

## 2023-12-13 DIAGNOSIS — J358 Other chronic diseases of tonsils and adenoids: Secondary | ICD-10-CM

## 2023-12-13 DIAGNOSIS — D649 Anemia, unspecified: Secondary | ICD-10-CM | POA: Diagnosis not present

## 2023-12-13 DIAGNOSIS — C3411 Malignant neoplasm of upper lobe, right bronchus or lung: Secondary | ICD-10-CM

## 2023-12-13 DIAGNOSIS — R9431 Abnormal electrocardiogram [ECG] [EKG]: Secondary | ICD-10-CM | POA: Diagnosis not present

## 2023-12-13 DIAGNOSIS — Z5111 Encounter for antineoplastic chemotherapy: Secondary | ICD-10-CM

## 2023-12-13 DIAGNOSIS — G4709 Other insomnia: Secondary | ICD-10-CM | POA: Diagnosis not present

## 2023-12-13 DIAGNOSIS — F419 Anxiety disorder, unspecified: Secondary | ICD-10-CM | POA: Diagnosis not present

## 2023-12-13 DIAGNOSIS — C3490 Malignant neoplasm of unspecified part of unspecified bronchus or lung: Secondary | ICD-10-CM

## 2023-12-13 DIAGNOSIS — R530 Neoplastic (malignant) related fatigue: Secondary | ICD-10-CM

## 2023-12-13 LAB — CMP (CANCER CENTER ONLY)
ALT: 10 U/L (ref 0–44)
AST: 17 U/L (ref 15–41)
Albumin: 3.2 g/dL — ABNORMAL LOW (ref 3.5–5.0)
Alkaline Phosphatase: 103 U/L (ref 38–126)
Anion gap: 8 (ref 5–15)
BUN: <5 mg/dL — ABNORMAL LOW (ref 8–23)
CO2: 27 mmol/L (ref 22–32)
Calcium: 9.2 mg/dL (ref 8.9–10.3)
Chloride: 102 mmol/L (ref 98–111)
Creatinine: 0.61 mg/dL (ref 0.44–1.00)
GFR, Estimated: >60 mL/min (ref >60)
Glucose, Bld: 101 mg/dL — ABNORMAL HIGH (ref 70–99)
Potassium: 3.5 mmol/L (ref 3.5–5.1)
Sodium: 137 mmol/L (ref 135–145)
Total Bilirubin: 0.5 mg/dL (ref 0.0–1.2)
Total Protein: 6.7 g/dL (ref 6.5–8.1)

## 2023-12-13 LAB — CBC WITH DIFFERENTIAL (CANCER CENTER ONLY)
Abs Immature Granulocytes: 0.03 10*3/uL (ref 0.00–0.07)
Basophils Absolute: 0 10*3/uL (ref 0.0–0.1)
Basophils Relative: 0 %
Eosinophils Absolute: 0 10*3/uL (ref 0.0–0.5)
Eosinophils Relative: 1 %
HCT: 27.8 % — ABNORMAL LOW (ref 36.0–46.0)
Hemoglobin: 8.4 g/dL — ABNORMAL LOW (ref 12.0–15.0)
Immature Granulocytes: 1 %
Lymphocytes Relative: 7 %
Lymphs Abs: 0.4 10*3/uL — ABNORMAL LOW (ref 0.7–4.0)
MCH: 26.1 pg (ref 26.0–34.0)
MCHC: 30.2 g/dL (ref 30.0–36.0)
MCV: 86.3 fL (ref 80.0–100.0)
Monocytes Absolute: 0.7 10*3/uL (ref 0.1–1.0)
Monocytes Relative: 13 %
Neutro Abs: 4.4 10*3/uL (ref 1.7–7.7)
Neutrophils Relative %: 78 %
Platelet Count: 355 10*3/uL (ref 150–400)
RBC: 3.22 MIL/uL — ABNORMAL LOW (ref 3.87–5.11)
RDW: 18.2 % — ABNORMAL HIGH (ref 11.5–15.5)
WBC Count: 5.6 10*3/uL (ref 4.0–10.5)
nRBC: 0 % (ref 0.0–0.2)

## 2023-12-13 LAB — SAMPLE TO BLOOD BANK

## 2023-12-13 LAB — MAGNESIUM: Magnesium: 1.6 mg/dL — ABNORMAL LOW (ref 1.7–2.4)

## 2023-12-13 LAB — PREPARE RBC (CROSSMATCH)

## 2023-12-13 MED ORDER — FOSAPREPITANT DIMEGLUMINE INJECTION 150 MG
150.0000 mg | Freq: Once | INTRAVENOUS | Status: AC
  Administered 2023-12-13: 150 mg via INTRAVENOUS
  Filled 2023-12-13: qty 150

## 2023-12-13 MED ORDER — ACETAMINOPHEN 325 MG PO TABS
650.0000 mg | ORAL_TABLET | Freq: Once | ORAL | Status: AC
  Administered 2023-12-13: 650 mg via ORAL
  Filled 2023-12-13: qty 2

## 2023-12-13 MED ORDER — SODIUM CHLORIDE 0.9% IV SOLUTION
250.0000 mL | INTRAVENOUS | Status: AC
  Administered 2023-12-13: 250 mL via INTRAVENOUS

## 2023-12-13 MED ORDER — ALPRAZOLAM 0.5 MG PO TABS: 0.5000 mg | ORAL_TABLET | Freq: Three times a day (TID) | ORAL | 0 refills | Status: DC | PRN

## 2023-12-13 MED ORDER — PALONOSETRON HCL INJECTION 0.25 MG/5ML
0.2500 mg | Freq: Once | INTRAVENOUS | Status: AC
  Administered 2023-12-13: 0.25 mg via INTRAVENOUS
  Filled 2023-12-13: qty 5

## 2023-12-13 MED ORDER — TEMAZEPAM 30 MG PO CAPS: 30.0000 mg | ORAL_CAPSULE | Freq: Every evening | ORAL | 3 refills | Status: DC | PRN

## 2023-12-13 MED ORDER — CETIRIZINE HCL 10 MG/ML IV SOLN
10.0000 mg | Freq: Once | INTRAVENOUS | Status: AC
  Administered 2023-12-13: 10 mg via INTRAVENOUS
  Filled 2023-12-13: qty 1

## 2023-12-13 MED ORDER — SODIUM CHLORIDE 0.9 % IV SOLN
400.0000 mg/m2 | Freq: Once | INTRAVENOUS | Status: AC
  Administered 2023-12-13: 700 mg via INTRAVENOUS
  Filled 2023-12-13: qty 8

## 2023-12-13 MED ORDER — SODIUM CHLORIDE 0.9 % IV SOLN
400.0000 mg | Freq: Once | INTRAVENOUS | Status: AC
  Administered 2023-12-13: 400 mg via INTRAVENOUS
  Filled 2023-12-13: qty 40

## 2023-12-13 MED ORDER — SODIUM CHLORIDE 0.9 % IV SOLN: INTRAVENOUS | Status: DC

## 2023-12-13 MED ORDER — SODIUM CHLORIDE 0.9% FLUSH
10.0000 mL | Freq: Once | INTRAVENOUS | Status: AC
Start: 2023-12-13 — End: 2023-12-13
  Administered 2023-12-13: 10 mL

## 2023-12-13 MED ORDER — DEXAMETHASONE 4 MG PO TABS: ORAL_TABLET | ORAL | 1 refills | Status: DC

## 2023-12-13 MED ORDER — FAMOTIDINE 20 MG PO TABS: 20.0000 mg | ORAL_TABLET | Freq: Every day | ORAL | 2 refills | Status: DC

## 2023-12-13 MED ORDER — DIPHENHYDRAMINE HCL 25 MG PO CAPS
25.0000 mg | ORAL_CAPSULE | Freq: Once | ORAL | Status: AC
  Administered 2023-12-13: 25 mg via ORAL
  Filled 2023-12-13: qty 1

## 2023-12-13 MED ORDER — DEXAMETHASONE SODIUM PHOSPHATE 10 MG/ML IJ SOLN
10.0000 mg | Freq: Once | INTRAMUSCULAR | Status: AC
  Administered 2023-12-13: 10 mg via INTRAVENOUS
  Filled 2023-12-13: qty 1

## 2023-12-13 NOTE — Patient Instructions (Signed)
[facility] - A DEPT OF [facility]  Discharge Instructions: Thank you for choosing [facility] to provide your oncology and hematology care.   If you have a lab appointment with the Cancer Center, please go directly to the Cancer Center and check in at the registration area.   Wear comfortable clothing and clothing appropriate for easy access to any Portacath or PICC line.   We strive to give you quality time with your provider. You may need to reschedule your appointment if you arrive late (15 or more minutes).  Arriving late affects you and other patients whose appointments are after yours.  Also, if you miss three or more appointments without notifying the office, you may be dismissed from the clinic at the provider's discretion.      For prescription refill requests, have your pharmacy contact our office and allow 72 hours for refills to be completed.    Today you received the following chemotherapy and/or immunotherapy agents alimta, carboplatin      To help prevent nausea and vomiting after your treatment, we encourage you to take your nausea medication as directed.  BELOW ARE SYMPTOMS THAT SHOULD BE REPORTED IMMEDIATELY: *FEVER GREATER THAN 100.4 F (38 C) OR HIGHER *CHILLS OR SWEATING *NAUSEA AND VOMITING THAT IS NOT CONTROLLED WITH YOUR NAUSEA MEDICATION *UNUSUAL SHORTNESS OF BREATH *UNUSUAL BRUISING OR BLEEDING *URINARY PROBLEMS (pain or burning when urinating, or frequent urination) *BOWEL PROBLEMS (unusual diarrhea, constipation, pain near the anus) TENDERNESS IN MOUTH AND THROAT WITH OR WITHOUT PRESENCE OF ULCERS (sore throat, sores in mouth, or a toothache) UNUSUAL RASH, SWELLING OR PAIN  UNUSUAL VAGINAL DISCHARGE OR ITCHING   Items with * indicate a potential emergency and should be followed up as soon as possible or go to the Emergency Department if any problems should occur.  Please show the CHEMOTHERAPY ALERT CARD or  IMMUNOTHERAPY ALERT CARD at check-in to the Emergency Department and triage nurse.  Should you have questions after your visit or need to cancel or reschedule your appointment, please contact [facility] - A DEPT OF [facility]  Dept: [phone]  and follow the prompts.  Office hours are 8:00 a.m. to 4:30 p.m. Monday - Friday. Please note that voicemails left after 4:00 p.m. may not be returned until the following business day.  We are closed weekends and major holidays. You have access to a nurse at all times for urgent questions. Please call the main number to the clinic Dept: [phone] and follow the prompts.   For any non-urgent questions, you may also contact your provider using MyChart. We now offer e-Visits for anyone 47 and older to request care online for non-urgent symptoms. For details visit [email or web address].   Also download the MyChart app! Go to the app store, search "MyChart", open the app, select [facility], and log in with your MyChart username and password.  Blood Transfusion, Adult, Care After The following information offers guidance on how to care for yourself after your procedure. Your health care provider may also give you more specific instructions. If you have problems or questions, contact your health care provider. What can I expect after the procedure? After the procedure, it is common to have: Bruising and soreness where the IV was inserted. A headache. Follow these instructions at home: IV insertion site care     Follow instructions from your health care provider about how to take  care of your IV insertion site. Make sure you: Wash your hands with soap and water for at least 20 seconds before and after you change your bandage (dressing). If soap and water are not available, use hand sanitizer. Change your dressing as told by your health care provider. Check your IV insertion site every day for signs of infection. Check  for: Redness, swelling, or pain. Bleeding from the site. Warmth. Pus or a bad smell. General instructions Take over-the-counter and prescription medicines only as told by your health care provider. Rest as told by your health care provider. Return to your normal activities as told by your health care provider. Keep all follow-up visits. Lab tests may need to be done at certain periods to recheck your blood counts. Contact a health care provider if: You have itching or red, swollen areas of skin (hives). You have a fever or chills. You have pain in the head, back, or chest. You feel anxious or you feel weak after doing your normal activities. You have redness, swelling, warmth, or pain around the IV insertion site. You have blood coming from the IV insertion site that does not stop with pressure. You have pus or a bad smell coming from your IV insertion site. If you received your blood transfusion in an outpatient setting, you will be told whom to contact to report any reactions. Get help right away if: You have symptoms of a serious allergic or immune system reaction, including: Trouble breathing or shortness of breath. Swelling of the face, feeling flushed, or widespread rash. Dark urine or blood in the urine. Fast heartbeat. These symptoms may be an emergency. Get help right away. Call 911. Do not wait to see if the symptoms will go away. Do not drive yourself to the hospital. Summary Bruising and soreness around the IV insertion site are common. Check your IV insertion site every day for signs of infection. Rest as told by your health care provider. Return to your normal activities as told by your health care provider. Get help right away for symptoms of a serious allergic or immune system reaction to the blood transfusion. This information is not intended to replace advice given to you by your health care provider. Make sure you discuss any questions you have with your health  care provider. Document Revised: 01/14/2022 Document Reviewed: 01/14/2022 Elsevier Patient Education  2024 [employer].

## 2023-12-13 NOTE — Telephone Encounter (Addendum)
Oral Chemotherapy Pharmacist Encounter  I met with patient and patient's husband in infusion for overview of: Tarceva (erlotinib) for the treatment of metastatic, EGFR mutation-positive (G719A) non-small cell lung cancer, planned duration until disease progression or unacceptable toxicity.   Counseled patient on administration, dosing, side effects, monitoring, drug-food interactions, safe handling, storage, and disposal.  CBC w/ Diff and CMP from 12/13/23 assessed, noted patient Hgb of 8.4 g/dL. Patient starting on dose reduction of Tarceva per MD. Prescription dose and frequency assessed for appropriateness.  Patient will take Tarceva 100 tablets, 1 tablet by mouth once daily, on an empty stomach, 1 hour before or 2 hours after a meal.  Patient knows to avoid grapefruit and grapefruit juice while on Tarceva.  Tarceva start date: ~12/25/23 (medication has been shipped from [facility] per patient on 12/19/23, patient knows to start medication once she receives it)  Adverse effects include but are not limited to: rash, diarrhea, nausea, vomiting, decreased appetitie, fatigue, dyspnea, and ocular disorders.  Diarrhea: Patient will obtain Imodium (loperamide) to have on hand if they experience diarrhea. Patient knows to alert the office of 4 or more loose stools above baseline. Dry Skin/Rash: Educated patient to keep skin moisturized while on Tarceva with non-scented lotion twice daily. Patient knows to alert office for further assistance if they develop a rash. We also discussed that an acne-type rash may develop on Tarceva, and to let the office know if this occurs.   Discussed with patient to discontinue use of Zofran (ondansetron) for nausea/vomiting use as this can also contribute to Qtc prolongation (patient with QTcB of 552 ms in clinic on 12/13/23). Patient expressed understanding and states she has not needed zofran thus far in her treatment.   Reviewed with patient importance of keeping a  medication schedule and plan for any missed doses. No barriers to medication adherence identified.  Medication reconciliation performed and medication/allergy list updated. Current medication list in Epic reviewed, DDIs with Tarceva identified: Category X DDI between Tarceva and Pantoprazole - proton-pump inhibitors can decrease efficacy of Tarceva and are not recommended to use together. Patient states she is not sure why she is on pantoprazole, and will discontinue this. We discussed that if she does need an acid blocking agent in the future, she can utilize famotidine, as long as the erlotinib is given 2 hours before or 10 hours after famotidine dose Category D drug-drug interaction between Tarceva and Magnesium oxide - antacids can decrease serum concentrations, thus efficacy of Tarceva. Recommended to patient that she space magnesium oxide, and any other vitamins she takes, to be dosed in the PM so that they are appropriately spaced from erlotinib dosing.   Patient agreement for treatment documented in MD note on 12/13/23.  All questions answered.  Mrs. Carico voiced understanding and appreciation.   Medication education handout given to patient. Patient knows to call the office with questions or concerns. Oral Chemotherapy Clinic phone number provided to patient.   Sherry Ruffing, PharmD, BCPS, BCOP Hematology/Oncology Clinical Pharmacist [facility] Oral Chemotherapy Navigation Clinics [phone] 12/13/2023 11:02 AM

## 2023-12-13 NOTE — Telephone Encounter (Signed)
Oral Oncology Patient Advocate Encounter  SAV-Rx form and clinical documentation sent back to plan via e fax. [phone]  Jinger Neighbors, CPhT-Adv Oncology Pharmacy Patient Advocate [facility] Direct Number: [phone]  Fax: [phone]

## 2023-12-13 NOTE — Progress Notes (Signed)
Decrease alimta to 400mg /m2 and carbo to auc =4 per Dr Arbutus Ped

## 2023-12-13 NOTE — Telephone Encounter (Signed)
Oral Oncology Patient Advocate Encounter   Received notification that prior authorization for erlotinib is required.   PA submitted on 12/13/23 Submitted via phone to [facility]-Rx [phone] Status is pending     Jinger Neighbors, CPhT-Adv Oncology Pharmacy Patient Advocate [facility] Direct Number: [phone]  Fax: [phone]

## 2023-12-14 ENCOUNTER — Other Ambulatory Visit: Payer: Self-pay | Admitting: Internal Medicine

## 2023-12-14 LAB — BPAM RBC
Blood Product Expiration Date: 202503162359
ISSUE DATE / TIME: 202502121304
Unit Type and Rh: 7300

## 2023-12-14 LAB — TYPE AND SCREEN
ABO/RH(D): B POS
Antibody Screen: NEGATIVE
Unit division: 0

## 2023-12-14 MED ORDER — ERLOTINIB HCL 100 MG PO TABS: 100.0000 mg | ORAL_TABLET | Freq: Every day | ORAL | 3 refills | Status: DC

## 2023-12-14 NOTE — Telephone Encounter (Signed)
Oral Oncology Patient Advocate Encounter  Prior Authorization for erlotinib has been approved.    PA# [id number] Access code for online verification: [email or web address] Effective dates:  12/13/23 through until  Patients co-pay is $118.94   Jinger Neighbors, CPhT-Adv Oncology Pharmacy Patient Advocate [facility] Direct Number: [phone]  Fax: [phone]

## 2023-12-14 NOTE — Telephone Encounter (Signed)
Oral Oncology Patient Advocate Encounter  Patient has opted to use [facility] to receive erlotinib at a discounted price of $47.67 plus shipping costs. I have sent an email to [email or web address] with instructions on how to set up the online account. Once the account is set up and the prescription has been sent in, the patient will receive an email to finish setting up the first shipment.  Jinger Neighbors, CPhT-Adv Oncology Pharmacy Patient Advocate [facility] Direct Number: [phone]  Fax: [phone]

## 2023-12-15 ENCOUNTER — Other Ambulatory Visit: Payer: Self-pay

## 2023-12-15 MED ORDER — PROCHLORPERAZINE MALEATE 10 MG PO TABS: 10.0000 mg | ORAL_TABLET | Freq: Four times a day (QID) | ORAL | 6 refills | Status: DC | PRN

## 2023-12-15 NOTE — Telephone Encounter (Signed)
Pt called to refill compazine, see associated orders. No further needs at this time.

## 2023-12-19 ENCOUNTER — Inpatient Hospital Stay: Payer: No Typology Code available for payment source

## 2023-12-19 ENCOUNTER — Telehealth: Payer: Self-pay

## 2023-12-19 ENCOUNTER — Other Ambulatory Visit: Payer: Self-pay | Admitting: Physician Assistant

## 2023-12-19 DIAGNOSIS — C3411 Malignant neoplasm of upper lobe, right bronchus or lung: Secondary | ICD-10-CM

## 2023-12-19 DIAGNOSIS — E876 Hypokalemia: Secondary | ICD-10-CM

## 2023-12-19 DIAGNOSIS — Z95828 Presence of other vascular implants and grafts: Secondary | ICD-10-CM

## 2023-12-19 DIAGNOSIS — Z5111 Encounter for antineoplastic chemotherapy: Secondary | ICD-10-CM | POA: Diagnosis not present

## 2023-12-19 DIAGNOSIS — R000 Tachycardia, unspecified: Secondary | ICD-10-CM

## 2023-12-19 LAB — CMP (CANCER CENTER ONLY)
ALT: 27 U/L (ref 0–44)
AST: 36 U/L (ref 15–41)
Albumin: 3.6 g/dL (ref 3.5–5.0)
Alkaline Phosphatase: 113 U/L (ref 38–126)
Anion gap: 9 (ref 5–15)
BUN: 12 mg/dL (ref 8–23)
CO2: 31 mmol/L (ref 22–32)
Calcium: 9.7 mg/dL (ref 8.9–10.3)
Chloride: 95 mmol/L — ABNORMAL LOW (ref 98–111)
Creatinine: 0.86 mg/dL (ref 0.44–1.00)
GFR, Estimated: >60 mL/min (ref >60)
Glucose, Bld: 124 mg/dL — ABNORMAL HIGH (ref 70–99)
Potassium: 3.2 mmol/L — ABNORMAL LOW (ref 3.5–5.1)
Sodium: 135 mmol/L (ref 135–145)
Total Bilirubin: 0.7 mg/dL (ref 0.0–1.2)
Total Protein: 6.9 g/dL (ref 6.5–8.1)

## 2023-12-19 LAB — CBC WITH DIFFERENTIAL (CANCER CENTER ONLY)
Abs Immature Granulocytes: 0.03 10*3/uL (ref 0.00–0.07)
Basophils Absolute: 0 10*3/uL (ref 0.0–0.1)
Basophils Relative: 0 %
Eosinophils Absolute: 0.1 10*3/uL (ref 0.0–0.5)
Eosinophils Relative: 2 %
HCT: 34.5 % — ABNORMAL LOW (ref 36.0–46.0)
Hemoglobin: 11.1 g/dL — ABNORMAL LOW (ref 12.0–15.0)
Immature Granulocytes: 1 %
Lymphocytes Relative: 10 %
Lymphs Abs: 0.4 10*3/uL — ABNORMAL LOW (ref 0.7–4.0)
MCH: 27.1 pg (ref 26.0–34.0)
MCHC: 32.2 g/dL (ref 30.0–36.0)
MCV: 84.1 fL (ref 80.0–100.0)
Monocytes Absolute: 0.1 10*3/uL (ref 0.1–1.0)
Monocytes Relative: 2 %
Neutro Abs: 3.2 10*3/uL (ref 1.7–7.7)
Neutrophils Relative %: 85 %
Platelet Count: 280 10*3/uL (ref 150–400)
RBC: 4.1 MIL/uL (ref 3.87–5.11)
RDW: 16.6 % — ABNORMAL HIGH (ref 11.5–15.5)
WBC Count: 3.8 10*3/uL — ABNORMAL LOW (ref 4.0–10.5)
nRBC: 0 % (ref 0.0–0.2)

## 2023-12-19 LAB — SAMPLE TO BLOOD BANK

## 2023-12-19 MED ORDER — POTASSIUM CHLORIDE CRYS ER 20 MEQ PO TBCR: 20.0000 meq | EXTENDED_RELEASE_TABLET | Freq: Every day | ORAL | 0 refills | Status: DC

## 2023-12-19 MED ORDER — SODIUM CHLORIDE 0.9% FLUSH
10.0000 mL | Freq: Once | INTRAVENOUS | Status: AC
  Administered 2023-12-19: 10 mL

## 2023-12-19 MED ORDER — HEPARIN SOD (PORK) LOCK FLUSH 100 UNIT/ML IV SOLN
500.0000 [IU] | Freq: Once | INTRAVENOUS | Status: AC
  Administered 2023-12-19: 500 [IU]

## 2023-12-19 NOTE — Telephone Encounter (Signed)
Spoke with patient about lab results. Per Cassie, PA- potassium is low.  Sent potassium chloride to pharmacy.  Patient stated that she is weak and not eating a lot. Encouraged patient to drink fluids, eat smaller meals, and move around best she can.  Informed patient to give our office a call if she's not feeling better tomorrow. Patient verbalized understanding.

## 2023-12-21 ENCOUNTER — Encounter: Payer: Self-pay | Admitting: Internal Medicine

## 2023-12-22 ENCOUNTER — Other Ambulatory Visit: Payer: Self-pay | Admitting: Physician Assistant

## 2023-12-22 DIAGNOSIS — C3411 Malignant neoplasm of upper lobe, right bronchus or lung: Secondary | ICD-10-CM

## 2023-12-23 NOTE — Progress Notes (Signed)
 Updated pemetrexed to new ERX [id number] @ 400 mg/m2  Pryor Ochoa, PharmD 12/23/23

## 2023-12-26 ENCOUNTER — Emergency Department (HOSPITAL_COMMUNITY): Payer: No Typology Code available for payment source

## 2023-12-26 ENCOUNTER — Encounter (HOSPITAL_COMMUNITY): Payer: Self-pay

## 2023-12-26 ENCOUNTER — Ambulatory Visit: Payer: No Typology Code available for payment source

## 2023-12-26 ENCOUNTER — Ambulatory Visit: Payer: No Typology Code available for payment source | Admitting: Internal Medicine

## 2023-12-26 ENCOUNTER — Encounter: Payer: Self-pay | Admitting: Internal Medicine

## 2023-12-26 ENCOUNTER — Inpatient Hospital Stay (HOSPITAL_COMMUNITY)
Admission: EM | Admit: 2023-12-26 | Discharge: 2024-01-02 | DRG: 808 | Disposition: A | Discharge status: Home / Self Care | Payer: No Typology Code available for payment source | Attending: Family Medicine | Admitting: Family Medicine

## 2023-12-26 ENCOUNTER — Other Ambulatory Visit: Payer: Self-pay

## 2023-12-26 ENCOUNTER — Inpatient Hospital Stay: Payer: No Typology Code available for payment source

## 2023-12-26 VITALS — BP 110/75 | HR 152 | Temp 98.8°F | Resp 20

## 2023-12-26 DIAGNOSIS — D61810 Antineoplastic chemotherapy induced pancytopenia: Principal | ICD-10-CM | POA: Diagnosis present

## 2023-12-26 DIAGNOSIS — Z888 Allergy status to other drugs, medicaments and biological substances status: Secondary | ICD-10-CM

## 2023-12-26 DIAGNOSIS — K219 Gastro-esophageal reflux disease without esophagitis: Secondary | ICD-10-CM | POA: Diagnosis present

## 2023-12-26 DIAGNOSIS — J4520 Mild intermittent asthma, uncomplicated: Secondary | ICD-10-CM | POA: Diagnosis present

## 2023-12-26 DIAGNOSIS — J850 Gangrene and necrosis of lung: Secondary | ICD-10-CM | POA: Diagnosis present

## 2023-12-26 DIAGNOSIS — Z95828 Presence of other vascular implants and grafts: Secondary | ICD-10-CM

## 2023-12-26 DIAGNOSIS — I700 Atherosclerosis of aorta: Secondary | ICD-10-CM | POA: Diagnosis present

## 2023-12-26 DIAGNOSIS — Z7952 Long term (current) use of systemic steroids: Secondary | ICD-10-CM

## 2023-12-26 DIAGNOSIS — E860 Dehydration: Secondary | ICD-10-CM | POA: Diagnosis present

## 2023-12-26 DIAGNOSIS — Z8249 Family history of ischemic heart disease and other diseases of the circulatory system: Secondary | ICD-10-CM

## 2023-12-26 DIAGNOSIS — Z833 Family history of diabetes mellitus: Secondary | ICD-10-CM

## 2023-12-26 DIAGNOSIS — R109 Unspecified abdominal pain: Secondary | ICD-10-CM

## 2023-12-26 DIAGNOSIS — R5383 Other fatigue: Secondary | ICD-10-CM

## 2023-12-26 DIAGNOSIS — C3411 Malignant neoplasm of upper lobe, right bronchus or lung: Secondary | ICD-10-CM | POA: Diagnosis present

## 2023-12-26 DIAGNOSIS — E785 Hyperlipidemia, unspecified: Secondary | ICD-10-CM | POA: Diagnosis present

## 2023-12-26 DIAGNOSIS — T451X5A Adverse effect of antineoplastic and immunosuppressive drugs, initial encounter: Secondary | ICD-10-CM | POA: Diagnosis present

## 2023-12-26 DIAGNOSIS — D6481 Anemia due to antineoplastic chemotherapy: Secondary | ICD-10-CM | POA: Diagnosis present

## 2023-12-26 DIAGNOSIS — R000 Tachycardia, unspecified: Secondary | ICD-10-CM | POA: Diagnosis not present

## 2023-12-26 DIAGNOSIS — Z7901 Long term (current) use of anticoagulants: Secondary | ICD-10-CM

## 2023-12-26 DIAGNOSIS — Z79899 Other long term (current) drug therapy: Secondary | ICD-10-CM

## 2023-12-26 DIAGNOSIS — I428 Other cardiomyopathies: Secondary | ICD-10-CM

## 2023-12-26 DIAGNOSIS — I4729 Other ventricular tachycardia: Secondary | ICD-10-CM | POA: Insufficient documentation

## 2023-12-26 DIAGNOSIS — I5021 Acute systolic (congestive) heart failure: Secondary | ICD-10-CM | POA: Diagnosis not present

## 2023-12-26 DIAGNOSIS — I2699 Other pulmonary embolism without acute cor pulmonale: Secondary | ICD-10-CM | POA: Diagnosis present

## 2023-12-26 DIAGNOSIS — D630 Anemia in neoplastic disease: Secondary | ICD-10-CM | POA: Diagnosis present

## 2023-12-26 DIAGNOSIS — E876 Hypokalemia: Secondary | ICD-10-CM | POA: Diagnosis present

## 2023-12-26 DIAGNOSIS — G4700 Insomnia, unspecified: Secondary | ICD-10-CM

## 2023-12-26 DIAGNOSIS — Z841 Family history of disorders of kidney and ureter: Secondary | ICD-10-CM

## 2023-12-26 DIAGNOSIS — C787 Secondary malignant neoplasm of liver and intrahepatic bile duct: Secondary | ICD-10-CM | POA: Diagnosis present

## 2023-12-26 DIAGNOSIS — D649 Anemia, unspecified: Principal | ICD-10-CM | POA: Insufficient documentation

## 2023-12-26 DIAGNOSIS — I110 Hypertensive heart disease with heart failure: Secondary | ICD-10-CM | POA: Diagnosis not present

## 2023-12-26 DIAGNOSIS — I951 Orthostatic hypotension: Secondary | ICD-10-CM | POA: Diagnosis present

## 2023-12-26 DIAGNOSIS — Z7984 Long term (current) use of oral hypoglycemic drugs: Secondary | ICD-10-CM

## 2023-12-26 DIAGNOSIS — I472 Ventricular tachycardia, unspecified: Secondary | ICD-10-CM | POA: Diagnosis not present

## 2023-12-26 DIAGNOSIS — Z86711 Personal history of pulmonary embolism: Secondary | ICD-10-CM

## 2023-12-26 DIAGNOSIS — C7951 Secondary malignant neoplasm of bone: Secondary | ICD-10-CM | POA: Diagnosis present

## 2023-12-26 DIAGNOSIS — F17290 Nicotine dependence, other tobacco product, uncomplicated: Secondary | ICD-10-CM | POA: Diagnosis present

## 2023-12-26 LAB — URINALYSIS, W/ REFLEX TO CULTURE (INFECTION SUSPECTED)
Bacteria, UA: NONE SEEN
Bilirubin Urine: NEGATIVE
Glucose, UA: NEGATIVE mg/dL
Ketones, ur: NEGATIVE mg/dL
Leukocytes,Ua: NEGATIVE
Nitrite: NEGATIVE
Protein, ur: NEGATIVE mg/dL
Specific Gravity, Urine: 1.003 — ABNORMAL LOW (ref 1.005–1.030)
pH: 7 (ref 5.0–8.0)

## 2023-12-26 LAB — CMP (CANCER CENTER ONLY)
ALT: 18 U/L (ref 0–44)
AST: 22 U/L (ref 15–41)
Albumin: 3.6 g/dL (ref 3.5–5.0)
Alkaline Phosphatase: 108 U/L (ref 38–126)
Anion gap: 9 (ref 5–15)
BUN: 7 mg/dL — ABNORMAL LOW (ref 8–23)
CO2: 28 mmol/L (ref 22–32)
Calcium: 9.4 mg/dL (ref 8.9–10.3)
Chloride: 101 mmol/L (ref 98–111)
Creatinine: 0.95 mg/dL (ref 0.44–1.00)
GFR, Estimated: >60 mL/min (ref >60)
Glucose, Bld: 95 mg/dL (ref 70–99)
Potassium: 4 mmol/L (ref 3.5–5.1)
Sodium: 138 mmol/L (ref 135–145)
Total Bilirubin: 0.9 mg/dL (ref 0.0–1.2)
Total Protein: 6.9 g/dL (ref 6.5–8.1)

## 2023-12-26 LAB — CBC WITH DIFFERENTIAL (CANCER CENTER ONLY)
Abs Immature Granulocytes: 0.01 10*3/uL (ref 0.00–0.07)
Basophils Absolute: 0 10*3/uL (ref 0.0–0.1)
Basophils Relative: 0 %
Eosinophils Absolute: 0.1 10*3/uL (ref 0.0–0.5)
Eosinophils Relative: 2 %
HCT: 27.3 % — ABNORMAL LOW (ref 36.0–46.0)
Hemoglobin: 8.6 g/dL — ABNORMAL LOW (ref 12.0–15.0)
Immature Granulocytes: 0 %
Lymphocytes Relative: 13 %
Lymphs Abs: 0.4 10*3/uL — ABNORMAL LOW (ref 0.7–4.0)
MCH: 26.9 pg (ref 26.0–34.0)
MCHC: 31.5 g/dL (ref 30.0–36.0)
MCV: 85.3 fL (ref 80.0–100.0)
Monocytes Absolute: 0.4 10*3/uL (ref 0.1–1.0)
Monocytes Relative: 12 %
Neutro Abs: 2.6 10*3/uL (ref 1.7–7.7)
Neutrophils Relative %: 73 %
Platelet Count: 85 10*3/uL — ABNORMAL LOW (ref 150–400)
RBC: 3.2 MIL/uL — ABNORMAL LOW (ref 3.87–5.11)
RDW: 16.9 % — ABNORMAL HIGH (ref 11.5–15.5)
WBC Count: 3.5 10*3/uL — ABNORMAL LOW (ref 4.0–10.5)
nRBC: 0 % (ref 0.0–0.2)

## 2023-12-26 LAB — CBC WITH DIFFERENTIAL/PLATELET
Abs Immature Granulocytes: 0 10*3/uL (ref 0.00–0.07)
Basophils Absolute: 0 10*3/uL (ref 0.0–0.1)
Basophils Relative: 0 %
Eosinophils Absolute: 0 10*3/uL (ref 0.0–0.5)
Eosinophils Relative: 1 %
HCT: 25.2 % — ABNORMAL LOW (ref 36.0–46.0)
Hemoglobin: 8 g/dL — ABNORMAL LOW (ref 12.0–15.0)
Immature Granulocytes: 0 %
Lymphocytes Relative: 12 %
Lymphs Abs: 0.3 10*3/uL — ABNORMAL LOW (ref 0.7–4.0)
MCH: 27.3 pg (ref 26.0–34.0)
MCHC: 31.7 g/dL (ref 30.0–36.0)
MCV: 86 fL (ref 80.0–100.0)
Monocytes Absolute: 0.4 10*3/uL (ref 0.1–1.0)
Monocytes Relative: 12 %
Neutro Abs: 2.2 10*3/uL (ref 1.7–7.7)
Neutrophils Relative %: 75 %
Platelets: 70 10*3/uL — ABNORMAL LOW (ref 150–400)
RBC: 2.93 MIL/uL — ABNORMAL LOW (ref 3.87–5.11)
RDW: 16.9 % — ABNORMAL HIGH (ref 11.5–15.5)
WBC: 2.9 10*3/uL — ABNORMAL LOW (ref 4.0–10.5)
nRBC: 0 % (ref 0.0–0.2)

## 2023-12-26 LAB — BLOOD GAS, ARTERIAL
Acid-Base Excess: 3.3 mmol/L — ABNORMAL HIGH (ref 0.0–2.0)
Bicarbonate: 26.7 mmol/L (ref 20.0–28.0)
Drawn by: 56037
O2 Saturation: 99.5 %
Patient temperature: 37
pCO2 arterial: 35 mm[Hg] (ref 32–48)
pH, Arterial: 7.49 — ABNORMAL HIGH (ref 7.35–7.45)
pO2, Arterial: 79 mm[Hg] — ABNORMAL LOW (ref 83–108)

## 2023-12-26 LAB — COMPREHENSIVE METABOLIC PANEL
ALT: 21 U/L (ref 0–44)
AST: 25 U/L (ref 15–41)
Albumin: 3 g/dL — ABNORMAL LOW (ref 3.5–5.0)
Alkaline Phosphatase: 103 U/L (ref 38–126)
Anion gap: 11 (ref 5–15)
BUN: 9 mg/dL (ref 8–23)
CO2: 25 mmol/L (ref 22–32)
Calcium: 8.9 mg/dL (ref 8.9–10.3)
Chloride: 100 mmol/L (ref 98–111)
Creatinine, Ser: 0.93 mg/dL (ref 0.44–1.00)
GFR, Estimated: >60 mL/min (ref >60)
Glucose, Bld: 99 mg/dL (ref 70–99)
Potassium: 3.7 mmol/L (ref 3.5–5.1)
Sodium: 136 mmol/L (ref 135–145)
Total Bilirubin: 1.2 mg/dL (ref 0.0–1.2)
Total Protein: 6.6 g/dL (ref 6.5–8.1)

## 2023-12-26 LAB — TROPONIN I (HIGH SENSITIVITY)
Troponin I (High Sensitivity): 3 ng/L (ref <18)
Troponin I (High Sensitivity): 3 ng/L (ref <18)

## 2023-12-26 LAB — MAGNESIUM: Magnesium: 1.2 mg/dL — ABNORMAL LOW (ref 1.7–2.4)

## 2023-12-26 LAB — RESP PANEL BY RT-PCR (RSV, FLU A&B, COVID)  RVPGX2
Influenza A by PCR: NEGATIVE
Influenza B by PCR: NEGATIVE
Resp Syncytial Virus by PCR: NEGATIVE
SARS Coronavirus 2 by RT PCR: NEGATIVE

## 2023-12-26 LAB — PREPARE RBC (CROSSMATCH)

## 2023-12-26 LAB — SAMPLE TO BLOOD BANK

## 2023-12-26 LAB — TSH: TSH: 1.211 u[IU]/mL (ref 0.350–4.500)

## 2023-12-26 LAB — I-STAT CG4 LACTIC ACID, ED: Lactic Acid, Venous: 0.5 mmol/L (ref 0.5–1.9)

## 2023-12-26 MED ORDER — ACETAMINOPHEN 650 MG RE SUPP: 650.0000 mg | Freq: Four times a day (QID) | RECTAL | Status: DC | PRN

## 2023-12-26 MED ORDER — PREDNISONE 5 MG PO TABS
5.0000 mg | ORAL_TABLET | Freq: Every day | ORAL | Status: DC
  Administered 2023-12-27 – 2023-12-31 (×5): 5 mg via ORAL
  Filled 2023-12-26 (×5): qty 1

## 2023-12-26 MED ORDER — APIXABAN 5 MG PO TABS
5.0000 mg | ORAL_TABLET | Freq: Two times a day (BID) | ORAL | Status: DC
  Administered 2023-12-26 – 2024-01-02 (×14): 5 mg via ORAL
  Filled 2023-12-26 (×14): qty 1

## 2023-12-26 MED ORDER — MAGNESIUM SULFATE 2 GM/50ML IV SOLN
2.0000 g | Freq: Once | INTRAVENOUS | Status: AC
  Administered 2023-12-26: 2 g via INTRAVENOUS
  Filled 2023-12-26: qty 50

## 2023-12-26 MED ORDER — HEPARIN SOD (PORK) LOCK FLUSH 100 UNIT/ML IV SOLN
INTRAVENOUS | Status: AC
  Filled 2023-12-26: qty 5

## 2023-12-26 MED ORDER — SODIUM CHLORIDE (PF) 0.9 % IJ SOLN
INTRAMUSCULAR | Status: AC
  Filled 2023-12-26: qty 50

## 2023-12-26 MED ORDER — POLYETHYLENE GLYCOL 3350 17 G PO PACK: 17.0000 g | PACK | Freq: Every day | ORAL | Status: DC | PRN

## 2023-12-26 MED ORDER — ALPRAZOLAM 0.5 MG PO TABS
0.5000 mg | ORAL_TABLET | Freq: Once | ORAL | Status: AC
  Administered 2023-12-26: 0.5 mg via ORAL
  Filled 2023-12-26: qty 1

## 2023-12-26 MED ORDER — LACTATED RINGERS IV BOLUS
1000.0000 mL | Freq: Once | INTRAVENOUS | Status: AC
  Administered 2023-12-26: 1000 mL via INTRAVENOUS

## 2023-12-26 MED ORDER — LOPERAMIDE HCL 2 MG PO CAPS
2.0000 mg | ORAL_CAPSULE | ORAL | Status: DC | PRN
  Administered 2023-12-28: 2 mg via ORAL
  Filled 2023-12-26 (×2): qty 1

## 2023-12-26 MED ORDER — VITAMIN B-12 100 MCG PO TABS
500.0000 ug | ORAL_TABLET | Freq: Every day | ORAL | Status: DC
  Administered 2023-12-27 – 2024-01-01 (×6): 500 ug via ORAL
  Filled 2023-12-26 (×3): qty 5
  Filled 2023-12-26: qty 1
  Filled 2023-12-26 (×3): qty 5

## 2023-12-26 MED ORDER — HEPARIN SOD (PORK) LOCK FLUSH 100 UNIT/ML IV SOLN
500.0000 [IU] | Freq: Once | INTRAVENOUS | Status: DC
Start: 2023-12-26 — End: 2023-12-26

## 2023-12-26 MED ORDER — FAMOTIDINE IN NACL 20-0.9 MG/50ML-% IV SOLN
20.0000 mg | Freq: Once | INTRAVENOUS | Status: AC
  Administered 2023-12-26: 20 mg via INTRAVENOUS
  Filled 2023-12-26: qty 50

## 2023-12-26 MED ORDER — FOLIC ACID 1 MG PO TABS
1.0000 mg | ORAL_TABLET | Freq: Every day | ORAL | Status: DC
  Administered 2023-12-27 – 2024-01-02 (×7): 1 mg via ORAL
  Filled 2023-12-26 (×7): qty 1

## 2023-12-26 MED ORDER — FAMOTIDINE 20 MG PO TABS
20.0000 mg | ORAL_TABLET | Freq: Every day | ORAL | Status: DC
  Administered 2023-12-27 – 2024-01-01 (×6): 20 mg via ORAL
  Filled 2023-12-26 (×7): qty 1

## 2023-12-26 MED ORDER — SODIUM CHLORIDE 0.9% FLUSH
10.0000 mL | Freq: Once | INTRAVENOUS | Status: AC
Start: 2023-12-26 — End: 2023-12-26
  Administered 2023-12-26: 10 mL

## 2023-12-26 MED ORDER — ACETAMINOPHEN 325 MG PO TABS: 650.0000 mg | ORAL_TABLET | Freq: Four times a day (QID) | ORAL | Status: DC | PRN

## 2023-12-26 MED ORDER — PROCHLORPERAZINE MALEATE 10 MG PO TABS: 10.0000 mg | ORAL_TABLET | Freq: Four times a day (QID) | ORAL | Status: DC | PRN

## 2023-12-26 MED ORDER — IOHEXOL 350 MG/ML SOLN
75.0000 mL | Freq: Once | INTRAVENOUS | Status: AC | PRN
  Administered 2023-12-26: 75 mL via INTRAVENOUS

## 2023-12-26 MED ORDER — LACTATED RINGERS IV SOLN: INTRAVENOUS | Status: AC

## 2023-12-26 MED ORDER — TEMAZEPAM 15 MG PO CAPS
30.0000 mg | ORAL_CAPSULE | Freq: Every day | ORAL | Status: DC
  Administered 2023-12-26 – 2024-01-01 (×7): 30 mg via ORAL
  Filled 2023-12-26 (×7): qty 2

## 2023-12-26 MED ORDER — ALPRAZOLAM 0.5 MG PO TABS
0.5000 mg | ORAL_TABLET | Freq: Every morning | ORAL | Status: DC
  Administered 2023-12-27 – 2023-12-29 (×3): 0.5 mg via ORAL
  Filled 2023-12-26 (×3): qty 1

## 2023-12-26 MED ORDER — SODIUM CHLORIDE 0.9% IV SOLUTION: Freq: Once | INTRAVENOUS | Status: DC

## 2023-12-26 MED ORDER — SODIUM CHLORIDE 0.9% FLUSH
3.0000 mL | Freq: Two times a day (BID) | INTRAVENOUS | Status: DC
  Administered 2023-12-27 – 2023-12-30 (×5): 3 mL via INTRAVENOUS

## 2023-12-26 NOTE — Assessment & Plan Note (Signed)
 C/w temazepam.

## 2023-12-26 NOTE — Progress Notes (Signed)
 Pt here for port flush and labs BP 110/75, HR stayed in 130s-150s range, labored breathing noted. Pt c/o coughing for the past few days, chest pain when breathing, and feeling weak.  She stated she feels like crap. MD notified. Pt port left accessed and transferred to ER.

## 2023-12-26 NOTE — ED Provider Notes (Addendum)
 Care of patient assumed from Dr. Andria Meuse. NSCLC dx in October. On chemo. Recent 3g/dL drop in hemoglobin.  Acute on chronic tachycardia. Awaiting transfusion of 1u. She prefers discharge.  Physical Exam  BP 114/71   Pulse (!) 133   Temp 98.3 F (36.8 C) (Oral)   Resp (!) 26   Ht 5\' 5"  (1.651 m)   Wt 58.5 kg   SpO2 97%   BMI 21.47 kg/m   Physical Exam Vitals and nursing note reviewed.  Constitutional:      General: She is not in acute distress.    Appearance: She is well-developed. She is not ill-appearing, toxic-appearing or diaphoretic.  HENT:     Head: Normocephalic and atraumatic.  Eyes:     Conjunctiva/sclera: Conjunctivae normal.  Cardiovascular:     Rate and Rhythm: Regular rhythm. Tachycardia present.  Pulmonary:     Effort: Pulmonary effort is normal. Tachypnea present. No respiratory distress.     Breath sounds: Normal breath sounds.  Abdominal:     Palpations: Abdomen is soft.     Tenderness: There is abdominal tenderness.  Musculoskeletal:        General: No swelling. Normal range of motion.     Cervical back: Neck supple.  Skin:    General: Skin is warm and dry.  Neurological:     General: No focal deficit present.     Mental Status: She is alert and oriented to person, place, and time.  Psychiatric:        Mood and Affect: Mood normal.        Behavior: Behavior normal.     Procedures  Procedures  ED Course / MDM    Medical Decision Making Amount and/or Complexity of Data Reviewed Labs: ordered. Radiology: ordered.  Risk Prescription drug management. Decision regarding hospitalization.   Patient had a transient episode of dizziness while using the bathroom.  This was prior to initiation of blood transfusion.  She remained tachycardic in the 130s.  She does endorse some abdominal discomfort which has been present for the past several days.  CT scan of abdomen pelvis was ordered.  Patient was given her home dose of Xanax to treat what could be a  withdrawal contributing to her tachycardia.  CT abdomen pelvis did not show any new findings.  Further lab work does show hypomagnesemia.  Replacing magnesium was ordered.  Patient states that she underwent some medication changes 2 weeks ago.  At the time, she changed her Protonix to Pepcid to avoid interactions with new chemotherapy medication.  She has not been taking her Pepcid.  This may be contributing to her GI upset.  IV Pepcid was ordered.  Following IV fluids, blood transfusion, and home dose of Xanax, patient remained tachycardic.  Heart rate was 135 at rest.  She was normotensive.  When sat up, heart rate would go to 145.  She would remain normotensive.  When going from sitting to standing, heart rate got into the 150s and SBP dropped 20 points.  I suspect ongoing hypovolemia despite IV fluid bolus and PRBCs.  Patient states that she is usually able to feel better after blood and fluids, but today "feels different".  She describes severe fatigue and lightheadedness with standing or even any movements in the bed.  Patient would prefer admission for ongoing gentle hydration and observation.  Patient to be admitted for further management.       Gloris Manchester, MD 12/26/23 2030    Gloris Manchester, MD 12/26/23 2030

## 2023-12-26 NOTE — H&P (Signed)
 History and Physical    Patient: Rebecca Ochoa ZOX:[id number] DOB: September 16, 1961 DOA: 12/26/2023 DOS: the patient was seen and examined on 12/26/2023 PCP: Patient, No Pcp Per  [facility]:  [facility]  Chief Complaint:  Chief Complaint  Patient presents with   Chest Pain   Cough   Weakness   HPI: Rebecca Ochoa is a 63 y.o. female with medical history significant of medical issues as listed below including lung cancer that is under treatment by oncology service.  Patient was well state of health till 2 days ago when she reprts new osnet of weakness. This was associated with sensation of sob, worse (she has chronic sob) and some chest discomfort anterior with deep rbeathing. No fever, no cugh, no expectoration. No leg sweling or cramping. Since coming to ER, patient has felt presyncopall on standing up X 2.  Patient was in cancer center lab today when she menioned to the staff their that she was feeling weak - Vital check revelaed HR 154. Aptient is sent to [facility] ER.  Patient also reports chronic diahrea - no change in that. No new abd pain (has about 7 days of periumbilical pain worse with food), no vomiting or dysuria. No rash on skin.  ER couse notable for presenting HR over 150. S.p 1 liter fluid bolus, getting 100 cc/hr since then. Patinet feeling better but still at 120-130/min HR.  Per report givne to me by ER provdier, patient was orhtostatic hypotensie in the ER. Medical eval is sought.  Patinet is s/p 1 unit PRBC in ER due to her anemia.  Review of Systems: As mentioned in the history of present illness. All other systems reviewed and are negative. Past Medical History:  Diagnosis Date   Anemia    Asthma, mild intermittent, well-controlled    GERD (gastroesophageal reflux disease)    Hx of migraines    Hyperlipidemia    Hypertension    Vitamin D deficiency    Past Surgical History:  Procedure Laterality Date   BREAST SURGERY     reduction   BRONCHIAL BIOPSY   08/08/2023   Procedure: BRONCHIAL BIOPSIES;  Surgeon: Leslye Peer, MD;  Location: [facility] ENDOSCOPY;  Service: Pulmonary;;   BRONCHIAL BRUSHINGS  08/08/2023   Procedure: BRONCHIAL BRUSHINGS;  Surgeon: Leslye Peer, MD;  Location[facility] ENDOSCOPY;  Service: Pulmonary;;   BRONCHIAL NEEDLE ASPIRATION BIOPSY  08/08/2023   Procedure: BRONCHIAL NEEDLE ASPIRATION BIOPSIES;  Surgeon: Leslye Peer, MD;  Location: [facility] ENDOSCOPY;  Service: Pulmonary;;   ENDOBRONCHIAL ULTRASOUND Bilateral 08/08/2023   Procedure: ENDOBRONCHIAL ULTRASOUND;  Surgeon: Leslye Peer, MD;  Location: [facility] ENDOSCOPY;  Service: Pulmonary;  Laterality: Bilateral;   HEMOSTASIS CONTROL  08/08/2023   Procedure: HEMOSTASIS CONTROL;  Surgeon: Leslye Peer, MD;  Location: [facility] ENDOSCOPY;  Service: Pulmonary;;   IR IMAGING GUIDED PORT INSERTION  09/01/2023   REDUCTION MAMMAPLASTY     TUBAL LIGATION     Social History:  reports that she has quit smoking. Her smoking use included e-cigarettes. She has never used smokeless tobacco. She reports current alcohol use of about 3.0 standard drinks of alcohol per week. She reports that she does not use drugs.  Allergies  Allergen Reactions   Biaxin [Clarithromycin] Nausea Only    Family History  Problem Relation Age of Onset   Heart disease Father    Hypertension Father    Diabetes Father    Kidney disease Father    Cancer Brother     Prior  to Admission medications   Medication Sig Start Date End Date Taking? Authorizing Provider  acetaminophen (TYLENOL) 500 MG tablet Take 1,000 mg by mouth every 8 (eight) hours as needed for mild pain.   Yes [provider]  ALPRAZolam (XANAX) 0.5 MG tablet Take 1 tablet (0.5 mg total) by mouth 3 (three) times daily as needed for anxiety. Patient taking differently: Take 0.5 mg by mouth in the morning. 12/13/23  Yes Pickenpack-Cousar, Arty Baumgartner, NP  apixaban (ELIQUIS) 5 MG TABS tablet Take 1 tablet (5 mg total) by mouth 2 (two) times daily.  09/25/23  Yes Jonah Blue, MD  cyanocobalamin (VITAMIN B12) 500 MCG tablet Take 1 tablet (500 mcg total) by mouth daily. 10/23/23 01/31/24 Yes Pokhrel, Rebekah Chesterfield, MD  dexamethasone (DECADRON) 4 MG tablet Take 1 tablet twice a day the day before, the day of, and the day after chemotherapy 12/13/23  Yes Heilingoetter, Cassandra L, PA-C  erlotinib (TARCEVA) 100 MG tablet Take 1 tablet (100 mg total) by mouth daily. Take on an empty stomach 1 hour before meals or 2 hours after 12/14/23  Yes Si Gaul, MD  famotidine (PEPCID) 20 MG tablet Take 1 tablet (20 mg total) by mouth at bedtime. 12/13/23  Yes Pickenpack-Cousar, Arty Baumgartner, NP  folic acid (FOLVITE) 1 MG tablet TAKE 1 TABLET BY MOUTH EVERY DAY 12/22/23  Yes Heilingoetter, Cassandra L, PA-C  HYDROcodone-acetaminophen (NORCO/VICODIN) 5-325 MG tablet Take 1 tablet by mouth every 6 (six) hours as needed. Patient taking differently: Take 1 tablet by mouth every 6 (six) hours as needed (for pain). 09/07/23  Yes Rhetta Mura, MD  loperamide (IMODIUM) 2 MG capsule Take 1 capsule (2 mg total) by mouth as needed for diarrhea or loose stools. 10/23/23  Yes Pokhrel, Laxman, MD  nystatin (MYCOSTATIN/NYSTOP) powder Apply 1 Application topically 3 (three) times daily. Apply to under breast for rash if needed. 11/27/23  Yes Walisiewicz, Kaitlyn E, PA-C  potassium chloride SA (KLOR-CON M) 20 MEQ tablet Take 1 tablet (20 mEq total) by mouth daily. 12/19/23  Yes Heilingoetter, Cassandra L, PA-C  predniSONE (DELTASONE) 10 MG tablet Take 0.5 tablets (5 mg total) by mouth daily with breakfast. 11/21/23  Yes Si Gaul, MD  prochlorperazine (COMPAZINE) 10 MG tablet Take 1 tablet (10 mg total) by mouth every 6 (six) hours as needed for nausea or vomiting. 12/15/23  Yes Pickenpack-Cousar, Arty Baumgartner, NP  Simethicone (GAS-X PO) Take 2 tablets by mouth daily as needed.   Yes [provider]  temazepam (RESTORIL) 30 MG capsule Take 1 capsule (30 mg total) by  mouth at bedtime as needed for sleep. Patient taking differently: Take 30 mg by mouth at bedtime. 12/13/23  Yes Pickenpack-Cousar, Arty Baumgartner, NP  bisoprolol-hydrochlorothiazide [facility]) 5-6.25 MG tablet Take  1 tablet  Daily  for BP 06/06/22 07/11/22  [name], MD    Physical Exam: Vitals:   12/26/23 1930 12/26/23 2000 12/26/23 2015 12/26/23 2030  BP: 131/79 128/80 132/76 124/75  Pulse: (!) 125 (!) 125 (!) 135 (!) 130  Resp: 20 (!) 23 (!) 27 (!) 26  Temp:      TempSrc:      SpO2: 96% 96% 97% 97%  Weight:      Height:       General - AAOx3, no disress apparent. Boyfirend at bedside. Resp - b/l a/e veiscualr Cvs-s1s2 normal. Tacy Abd - BS+ve. All quad soft non tedner Extremities - warm no edmea.  Data Reviewed:  Labs on Admission:  Results for orders placed  or performed during the hospital encounter of 12/26/23 (from the past 24 hours)  Resp panel by RT-PCR (RSV, Flu A&B, Covid) Anterior Nasal Swab     Status: None   Collection Time: 12/26/23  9:35 AM   Specimen: Anterior Nasal Swab  Result Value Ref Range   SARS Coronavirus 2 by RT PCR NEGATIVE NEGATIVE   Influenza A by PCR NEGATIVE NEGATIVE   Influenza B by PCR NEGATIVE NEGATIVE   Resp Syncytial Virus by PCR NEGATIVE NEGATIVE  Comprehensive metabolic panel     Status: Abnormal   Collection Time: 12/26/23  9:36 AM  Result Value Ref Range   Sodium 136 135 - 145 mmol/L   Potassium 3.7 3.5 - 5.1 mmol/L   Chloride 100 98 - 111 mmol/L   CO2 25 22 - 32 mmol/L   Glucose, Bld 99 70 - 99 mg/dL   BUN 9 8 - 23 mg/dL   Creatinine, Ser 5.78 0.44 - 1.00 mg/dL   Calcium 8.9 8.9 - 46.9 mg/dL   Total Protein 6.6 6.5 - 8.1 g/dL   Albumin 3.0 (L) 3.5 - 5.0 g/dL   AST 25 15 - 41 U/L   ALT 21 0 - 44 U/L   Alkaline Phosphatase 103 38 - 126 U/L   Total Bilirubin 1.2 0.0 - 1.2 mg/dL   GFR, Estimated >62 >95 mL/min   Anion gap 11 5 - 15  CBC with Differential     Status: Abnormal   Collection Time: 12/26/23  9:36 AM  Result Value Ref  Range   WBC 2.9 (L) 4.0 - 10.5 K/uL   RBC 2.93 (L) 3.87 - 5.11 MIL/uL   Hemoglobin 8.0 (L) 12.0 - 15.0 g/dL   HCT 28.4 (L) 13.2 - 44.0 %   MCV 86.0 80.0 - 100.0 fL   MCH 27.3 26.0 - 34.0 pg   MCHC 31.7 30.0 - 36.0 g/dL   RDW 10.2 (H) 72.5 - 36.6 %   Platelets 70 (L) 150 - 400 K/uL   nRBC 0.0 0.0 - 0.2 %   Neutrophils Relative % 75 %   Neutro Abs 2.2 1.7 - 7.7 K/uL   Lymphocytes Relative 12 %   Lymphs Abs 0.3 (L) 0.7 - 4.0 K/uL   Monocytes Relative 12 %   Monocytes Absolute 0.4 0.1 - 1.0 K/uL   Eosinophils Relative 1 %   Eosinophils Absolute 0.0 0.0 - 0.5 K/uL   Basophils Relative 0 %   Basophils Absolute 0.0 0.0 - 0.1 K/uL   WBC Morphology MORPHOLOGY UNREMARKABLE    Smear Review PLATELET COUNT CONFIRMED BY SMEAR    Immature Granulocytes 0 %   Abs Immature Granulocytes 0.00 0.00 - 0.07 K/uL   Ovalocytes PRESENT   Troponin I (High Sensitivity)     Status: None   Collection Time: 12/26/23  9:36 AM  Result Value Ref Range   Troponin I (High Sensitivity) 3 <18 ng/L  Type and screen [facility]     Status: None (Preliminary result)   Collection Time: 12/26/23  9:36 AM  Result Value Ref Range   ABO/RH(D) B POS    Antibody Screen NEG    Sample Expiration 12/29/2023,2359    Unit Number [id number]    Blood Component Type RED CELLS,LR    Unit division 00    Status of Unit ISSUED    Transfusion Status OK TO TRANSFUSE    Crossmatch Result      Compatible Performed at [facility], [address] [place], [place] [address]   Urinalysis, w/ Reflex to Culture (Infection Suspected) -Urine, Clean Catch     Status: Abnormal   Collection Time: 12/26/23 11:01 AM  Result Value Ref Range   Specimen Source URINE, CLEAN CATCH    Color, Urine STRAW (A) YELLOW   APPearance CLEAR CLEAR   Specific Gravity, Urine 1.003 (L) 1.005 - 1.030   pH 7.0 5.0 - 8.0   Glucose, UA NEGATIVE NEGATIVE mg/dL   Hgb urine dipstick SMALL (A) NEGATIVE   Bilirubin Urine  NEGATIVE NEGATIVE   Ketones, ur NEGATIVE NEGATIVE mg/dL   Protein, ur NEGATIVE NEGATIVE mg/dL   Nitrite NEGATIVE NEGATIVE   Leukocytes,Ua NEGATIVE NEGATIVE   RBC / HPF 0-5 0 - 5 RBC/hpf   WBC, UA 0-5 0 - 5 WBC/hpf   Bacteria, UA NONE SEEN NONE SEEN   Squamous Epithelial / HPF 0-5 0 - 5 /HPF  Troponin I (High Sensitivity)     Status: None   Collection Time: 12/26/23  1:28 PM  Result Value Ref Range   Troponin I (High Sensitivity) 3 <18 ng/L  Prepare RBC (crossmatch)     Status: None   Collection Time: 12/26/23  2:04 PM  Result Value Ref Range   Order Confirmation      ORDER PROCESSED BY BLOOD BANK Performed at [facility], [address] [place], [place] [address]   TSH     Status: None   Collection Time: 12/26/23  7:16 PM  Result Value Ref Range   TSH 1.211 0.350 - 4.500 uIU/mL  Magnesium     Status: Abnormal   Collection Time: 12/26/23  7:16 PM  Result Value Ref Range   Magnesium 1.2 (L) 1.7 - 2.4 mg/dL  I-Stat CG4 Lactic Acid     Status: None   Collection Time: 12/26/23  7:26 PM  Result Value Ref Range   Lactic Acid, Venous 0.5 0.5 - 1.9 mmol/L   Basic Metabolic Panel: Recent Labs  Lab 12/26/23 0755 12/26/23 0936 12/26/23 1916  NA 138 136  --   K 4.0 3.7  --   CL 101 100  --   CO2 28 25  --   GLUCOSE 95 99  --   BUN 7* 9  --   CREATININE 0.95 0.93  --   CALCIUM 9.4 8.9  --   MG  --   --  1.2*   Liver Function Tests: Recent Labs  Lab 12/26/23 0755 12/26/23 0936  AST 22 25  ALT 18 21  ALKPHOS 108 103  BILITOT 0.9 1.2  PROT 6.9 6.6  ALBUMIN 3.6 3.0*   No results for input(s): "LIPASE", "AMYLASE" in the last 168 hours. No results for input(s): "AMMONIA" in the last 168 hours. CBC: Recent Labs  Lab 12/26/23 0755 12/26/23 0936  WBC 3.5* 2.9*  NEUTROABS 2.6 2.2  HGB 8.6* 8.0*  HCT 27.3* 25.2*  MCV 85.3 86.0  PLT 85* 70*   Cardiac Enzymes: Recent Labs  Lab 12/26/23 0936 12/26/23 1328  TROPONINIHS 3 3    BNP (last 3  results) No results for input(s): "PROBNP" in the last 8760 hours. CBG: No results for input(s): "GLUCAP" in the last 168 hours.  Radiological Exams on Admission:  CT ABDOMEN PELVIS WO CONTRAST Result Date: 12/26/2023 CLINICAL DATA:  History of non-small cell lung cancer, abdominal pain, anemia EXAM: CT ABDOMEN AND PELVIS WITHOUT CONTRAST TECHNIQUE: Multidetector CT imaging of the abdomen and pelvis was performed following the standard protocol without IV  contrast. RADIATION DOSE REDUCTION: This exam was performed according to the departmental dose-optimization program which includes automated exposure control, adjustment of the mA and/or kV according to patient size and/or use of iterative reconstruction technique. COMPARISON:  11/09/2023, 12/26/2023 FINDINGS: Lower chest: Multiple pulmonary nodules and right hilar mass are seen consistent with known metastatic lung cancer. Please see CT chest performed earlier today. Hepatobiliary: Numerous hypodense masses are seen throughout the liver, demonstrating decreased size and central necrosis since prior study. Index lesion in the left lobe liver measures 6.0 x 3.5 cm reference image 10/2, previously 8.3 x 4.5 cm. No evidence of cholelithiasis or cholecystitis. No evidence of biliary duct dilation. Pancreas: Unremarkable. No pancreatic ductal dilatation or surrounding inflammatory changes. Spleen: Normal in size without focal abnormality. The hypodense lesions seen on prior study, and visualized on earlier CT chest performed today, are not well visualized on this exam likely due to phase of contrast enhancement. Adrenals/Urinary Tract: Numerous indeterminate bilateral renal hypodensities are again noted. Index lesion in the interpolar right kidney reference image 17/2 measures 1.7 cm, previously measuring 1.8 cm. No hydronephrosis or nephrolithiasis. The adrenals are unremarkable. Excreted contrast within the urinary bladder with no filling defect identified.  Stomach/Bowel: No bowel obstruction or ileus. Normal appendix right lower quadrant. Diverticulosis of the sigmoid colon without evidence of acute diverticulitis. Vascular/Lymphatic: Stable aortic atherosclerosis. There is persistent adenopathy at the porta hepatis, with decreased areas of necrosis and cavitation since prior study. Index lymph node on image 16/2 measures 2.4 cm in short axis, unchanged by my measurements since prior. No new adenopathy identified. Reproductive: Uterus and bilateral adnexa are unremarkable. Other: No free fluid or free intraperitoneal gas. No abdominal wall hernia. Musculoskeletal: No acute or destructive bony abnormalities. Reconstructed images demonstrate no additional findings. IMPRESSION: 1. Right hilar mass and numerous bilateral pulmonary nodules consistent with known history of metastatic lung cancer. Please see chest CT performed earlier today. 2. Numerous hypodense masses within the liver, demonstrating decrease in size and resolution of central necrosis/cavitation on prior study. 3. Continued portacaval adenopathy, without significant change in size of lymph nodes. The central cavitation/necrosis has resolved. 4. Numerous bilateral renal hypodensities, too small to characterize, but grossly stable since prior exam. Metastatic disease remains the diagnosis of exclusion. 5. The splenic hypodensity seen on previous study and visualized on earlier chest CT today are not well visualized on this exam, likely due to delayed imaging relative to contrast administration performed earlier for chest CT. 6. Sigmoid diverticulosis without diverticulitis. 7. Aortic Atherosclerosis (ICD10-I70.0). Electronically Signed   By: Sharlet Salina M.D.   On: 12/26/2023 18:40   CT Angio Chest PE W and/or Wo Contrast Result Date: 12/26/2023 CLINICAL DATA:  Concern for pulmonary embolism. History of non-small cell lung cancer. EXAM: CT ANGIOGRAPHY CHEST WITH CONTRAST TECHNIQUE: Multidetector CT  imaging of the chest was performed using the standard protocol during bolus administration of intravenous contrast. Multiplanar CT image reconstructions and MIPs were obtained to evaluate the vascular anatomy. RADIATION DOSE REDUCTION: This exam was performed according to the departmental dose-optimization program which includes automated exposure control, adjustment of the mA and/or kV according to patient size and/or use of iterative reconstruction technique. CONTRAST:  75mL OMNIPAQUE IOHEXOL 350 MG/ML SOLN COMPARISON:  CT dated 11/09/2023. FINDINGS: Cardiovascular: There is no cardiomegaly or pericardial effusion. Mild atherosclerotic calcification of the thoracic aorta. No aneurysmal dilatation or dissection. The origins of the great vessels of the aortic arch appear patent. Left-sided Port-A-Cath with tip at the cavoatrial junction. No  pulmonary artery embolus identified. Mediastinum/Nodes: Right hilar mass encasing the right pulmonary artery and right upper and middle lobe bronchi relatively similar to prior CT. The esophagus is grossly unremarkable. No mediastinal fluid collection. Lungs/Pleura: Multiple bilateral pulmonary nodules similar or slightly decreased since the prior CT consistent with metastatic disease. No consolidative changes. Trace right pleural effusion. No pneumothorax. The central airways are patent. Upper Abdomen: Retroperitoneal adenopathy and left liver masses. Musculoskeletal: Osteopenia with degenerative changes. No acute osseous pathology. Review of the MIP images confirms the above findings. IMPRESSION: 1. No CT evidence of pulmonary artery embolus. 2. Right hilar mass and pulmonary metastasis similar or slightly decreased in size since the prior CT. 3. Retroperitoneal adenopathy and left liver masses. 4.  Aortic Atherosclerosis (ICD10-I70.0). Electronically Signed   By: Elgie Collard M.D.   On: 12/26/2023 14:18   DG Chest 1 View Result Date: 12/26/2023 CLINICAL DATA:  Chest  pain, cough, and weakness. EXAM: CHEST  1 VIEW COMPARISON:  11/09/2023. FINDINGS: The heart size and mediastinal contours are within normal limits. There is atherosclerotic calcification of the aorta scattered opacities with right upper lobe and right hilar mass are noted in the lungs bilaterally, compatible with known pulmonary nodules and masses. No effusion or pneumothorax is seen. A left chest port is stable in position. No acute osseous abnormality. IMPRESSION: Scattered opacities in the lungs bilaterally with right upper lobe and right hilar masslike consolidation, increased from the prior exam. Electronically Signed   By: Thornell Sartorius M.D.   On: 12/26/2023 10:46    EKG: Independently reviewed. Sinus tachycardia.  I/O last 3 completed shifts: In: 1500 [Blood:500; IV Piggyback:1000] Out: -  No intake/output data recorded.        Assessment and Plan: * Sinus tachycardia Patient reports sensation of weakness for about 3 days, noted to be slightly tachypneic (will check ABG) as well as having marked sinus tachycardia and per report orthostatic hypotension.  S/p 1 L of LR, will repeat and continue iv hydration. Rechek orthostatic in AM. Discussed orthostatic precaution with patient.   Patient also had anemia for which she received 1 unit of PRBC in the ER.  Patient feeling better right now.  However persistent tachycardia.   CT PE is negatvei for PE and no evidence of infection. Check cortisol in AM. Will check stool for c diff. Recent echo on file. Once note from 12/06/2023 shows HR 123-160  Insomnia C/w temazepam.  Current chronic use of systemic steroids Patient on prednisone 5mg  . Indication not apparent. I will c.w . Same. Onc is prescribing this to see if this helps her HR. Will chck cortiosl in AM  Abdominal pain Ct abd shows decreasing/stable tumour burden. Patient tolering diet. Moniotr.  Pulmonary embolism (HCC) Chronic. C.w. apixaban. No evidence of bleeding on  history.  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 Consider onc input to restart Tarceva.   C/w famotidine.   Advance Care Planning:   Code Status: Prior full code.  Consults: none at this time.  Family Communication: boyfirend at bedside.  Severity of Illness: The appropriate patient status for this patient is INPATIENT. Inpatient status is judged to be reasonable and necessary in order to provide the required intensity of service to ensure the patient's safety. The patient's presenting symptoms, physical exam findings, and initial radiographic and laboratory data in the context of their chronic comorbidities is felt to place them at high risk for further clinical deterioration. Furthermore, it is not anticipated  that the patient will be medically stable for discharge from the hospital within 2 midnights of admission.   * I certify that at the point of admission it is my clinical judgment that the patient will require inpatient hospital care spanning beyond 2 midnights from the point of admission due to high intensity of service, high risk for further deterioration and high frequency of surveillance required.*  Author: Nolberto Hanlon, MD 12/26/2023 9:51 PM  For on call review [email or web address].

## 2023-12-26 NOTE — Assessment & Plan Note (Signed)
 Ct abd shows decreasing/stable tumour burden. Patient tolering diet. Moniotr.

## 2023-12-26 NOTE — Assessment & Plan Note (Addendum)
 Patient reports sensation of weakness for about 3 days, noted to be slightly tachypneic (will check ABG) as well as having marked sinus tachycardia and per report orthostatic hypotension.  S/p 1 L of LR, will repeat and continue iv hydration. Rechek orthostatic in AM. Discussed orthostatic precaution with patient.   Patient also had anemia for which she received 1 unit of PRBC in the ER.  Patient feeling better right now.  However persistent tachycardia.   CT PE is negatvei for PE and no evidence of infection. Check cortisol in AM. Will check stool for c diff. Recent echo on file. Once note from 12/06/2023 shows HR 123-160

## 2023-12-26 NOTE — Assessment & Plan Note (Signed)
 Patient on prednisone 5mg  . Indication not apparent. I will c.w . Same. Onc is prescribing this to see if this helps her HR. Will chck cortiosl in AM

## 2023-12-26 NOTE — Assessment & Plan Note (Signed)
 Chronic. C.w. apixaban. No evidence of bleeding on history.

## 2023-12-26 NOTE — ED Provider Notes (Signed)
 [facility] Provider Note  CSN: [id number] Arrival date & time: 12/26/23 4098  Chief Complaint(s) Chest Pain, Cough, and Weakness  HPI Rebecca Ochoa is a 63 y.o. female here today for tachycardia.  Patient was at cancer center for blood draw, noted to be tachycardic.  Patient also had a hemoglobin drop.  Patient currently receiving treatment for non-small cell lung cancer.  Patient is on Eliquis.   Past Medical History Past Medical History:  Diagnosis Date   Anemia    Asthma, mild intermittent, well-controlled    GERD (gastroesophageal reflux disease)    Hx of migraines    Hyperlipidemia    Hypertension    Vitamin D deficiency    Patient Active Problem List   Diagnosis Date Noted   Encounter for antineoplastic chemotherapy 12/13/2023   Prolonged QT interval 12/13/2023   Hypotension 11/09/2023   Tachycardia 11/09/2023   Port-A-Cath in place 10/31/2023   Acute respiratory failure with hypoxia (HCC) 10/17/2023   SOB (shortness of breath) 10/17/2023   Physical deconditioning 10/17/2023   Primary malignant neoplasm of lung metastatic to other site (HCC) 10/17/2023   Tonsillar mass 09/25/2023   ICH (intracerebral hemorrhage) (HCC) 09/25/2023   History of pulmonary embolism 09/25/2023   Protein-calorie malnutrition, severe 09/22/2023   Acute intractable headache 09/22/2023   Goals of care, counseling/discussion 09/12/2023   Pulmonary embolism (HCC) 09/04/2023   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 Authorizing Provider  acetaminophen (TYLENOL) 500 MG tablet Take 1,000 mg by mouth every 8 (eight) hours as needed for mild pain.    [provider]  albuterol (PROVENTIL) (2.5 MG/3ML) 0.083% nebulizer solution Take 3 mLs (2.5 mg total) by nebulization every 4 (four) hours as needed for wheezing or shortness of breath. 10/23/23   Pokhrel, Rebekah Chesterfield, MD  ALPRAZolam Prudy Feeler) 0.5 MG tablet Take 1 tablet (0.5 mg total) by mouth 3 (three) times daily as needed for anxiety. 12/13/23   Pickenpack-Cousar, Arty Baumgartner, NP  apixaban (ELIQUIS) 5 MG TABS tablet Take 1 tablet (5 mg total) by mouth 2 (two) times daily. 09/25/23   Jonah Blue, MD  clindamycin (CLINDAGEL) 1 % gel Apply topically 2 (two) times daily. 11/21/23   Si Gaul, MD  cyanocobalamin (VITAMIN B12) 500 MCG tablet Take 1 tablet (500 mcg total) by mouth daily. 10/23/23 01/31/24  Pokhrel, Rebekah Chesterfield, MD  dexamethasone (DECADRON) 4 MG tablet Take 1 tablet twice a day the day before, the day of, and the day after chemotherapy 12/13/23   Heilingoetter, Cassandra L, PA-C  erlotinib (TARCEVA) 100 MG tablet Take 1 tablet (100 mg total) by mouth daily. Take on an empty stomach 1 hour before meals or 2 hours after 12/14/23   Si Gaul, MD  famotidine (PEPCID) 20 MG tablet Take 1 tablet (20 mg total) by mouth at bedtime. 12/13/23   Pickenpack-Cousar, Arty Baumgartner, NP  folic  acid (FOLVITE) 1 MG tablet TAKE 1 TABLET BY MOUTH EVERY DAY 12/22/23   Heilingoetter, Cassandra L, PA-C  HYDROcodone-acetaminophen (NORCO/VICODIN) 5-325 MG tablet Take 1 tablet by mouth every 6 (six) hours as needed. Patient taking differently: Take 1 tablet by mouth every 6 (six) hours as needed (for pain). 09/07/23   Rhetta Mura, MD  loperamide (IMODIUM) 2 MG capsule Take 1 capsule (2 mg total)  by mouth as needed for diarrhea or loose stools. 10/23/23   Pokhrel, Rebekah Chesterfield, MD  nystatin (MYCOSTATIN/NYSTOP) powder Apply 1 Application topically 3 (three) times daily. Apply to under breast for rash if needed. 11/27/23   Walisiewicz, Kaitlyn E, PA-C  ondansetron (ZOFRAN) 4 MG tablet Take 1 tablet (4 mg total) by mouth every 6 (six) hours as needed for nausea. 08/17/23   Rolly Salter, MD  ondansetron (ZOFRAN) 8 MG tablet Take 8 mg by mouth every 8 (eight) hours as needed for nausea or vomiting.    [provider]  potassium chloride SA (KLOR-CON M) 20 MEQ tablet Take 1 tablet (20 mEq total) by mouth daily. 12/19/23   Heilingoetter, Cassandra L, PA-C  predniSONE (DELTASONE) 10 MG tablet Take 0.5 tablets (5 mg total) by mouth daily with breakfast. 11/21/23   Si Gaul, MD  prochlorperazine (COMPAZINE) 10 MG tablet Take 1 tablet (10 mg total) by mouth every 6 (six) hours as needed for nausea or vomiting. 12/15/23   Pickenpack-Cousar, Arty Baumgartner, NP  temazepam (RESTORIL) 30 MG capsule Take 1 capsule (30 mg total) by mouth at bedtime as needed for sleep. 12/13/23   Pickenpack-Cousar, Arty Baumgartner, NP  bisoprolol-hydrochlorothiazide [facility]) 5-6.25 MG tablet Take  1 tablet  Daily  for BP 06/06/22 07/11/22  [name], MD                                                                                                                                    Past Surgical History Past Surgical History:  Procedure Laterality Date   BREAST SURGERY     reduction   BRONCHIAL BIOPSY  08/08/2023   Procedure: BRONCHIAL BIOPSIES;  Surgeon: Leslye Peer, MD;  Location[facility] ENDOSCOPY;  Service: Pulmonary;;   BRONCHIAL BRUSHINGS  08/08/2023   Procedure: BRONCHIAL BRUSHINGS;  Surgeon: Leslye Peer, MD;  Location: [facility] ENDOSCOPY;  Service: Pulmonary;;   BRONCHIAL NEEDLE ASPIRATION BIOPSY  08/08/2023   Procedure: BRONCHIAL NEEDLE ASPIRATION BIOPSIES;  Surgeon: Leslye Peer, MD;  Location: [facility] ENDOSCOPY;  Service:  Pulmonary;;   ENDOBRONCHIAL ULTRASOUND Bilateral 08/08/2023   Procedure: ENDOBRONCHIAL ULTRASOUND;  Surgeon: Leslye Peer, MD;  Location: [facility] ENDOSCOPY;  Service: Pulmonary;  Laterality: Bilateral;   HEMOSTASIS CONTROL  08/08/2023   Procedure: HEMOSTASIS CONTROL;  Surgeon: Leslye Peer, MD;  Location[facility] ENDOSCOPY;  Service: Pulmonary;;   IR IMAGING GUIDED PORT INSERTION  09/01/2023   REDUCTION MAMMAPLASTY     TUBAL LIGATION     Family History Family  History  Problem Relation Age of Onset   Heart disease Father    Hypertension Father    Diabetes Father    Kidney disease Father    Cancer Brother     Social History Social History   Tobacco Use   Smoking status: Former    Types: E-cigarettes   Smokeless tobacco: Never   Tobacco comments:    Quit in October 2024.  11/09/2023 hfb  Vaping Use   Vaping status: Every Day  Substance Use Topics   Alcohol use: Yes    Alcohol/week: 3.0 standard drinks of alcohol    Types: 3 Standard drinks or equivalent per week   Drug use: Never   Allergies Biaxin [clarithromycin]  Review of Systems Review of Systems  Physical Exam Vital Signs  I have reviewed the triage vital signs BP 114/71   Pulse (!) 133   Temp 98.3 F (36.8 C) (Oral)   Resp (!) 26   Ht 5\' 5"  (1.651 m)   Wt 58.5 kg   SpO2 97%   BMI 21.47 kg/m   Physical Exam Vitals reviewed.  Cardiovascular:     Rate and Rhythm: Tachycardia present.     Heart sounds: Normal heart sounds.  Pulmonary:     Effort: Pulmonary effort is normal. No tachypnea or respiratory distress.     Breath sounds: No decreased breath sounds.  Neurological:     Mental Status: She is alert.     ED Results and Treatments Labs (all labs ordered are listed, but only abnormal results are displayed) Labs Reviewed  COMPREHENSIVE METABOLIC PANEL - Abnormal; Notable for the following components:      Result Value   Albumin 3.0 (*)    All other components within normal limits  CBC WITH  DIFFERENTIAL/PLATELET - Abnormal; Notable for the following components:   WBC 2.9 (*)    RBC 2.93 (*)    Hemoglobin 8.0 (*)    HCT 25.2 (*)    RDW 16.9 (*)    Platelets 70 (*)    Lymphs Abs 0.3 (*)    All other components within normal limits  URINALYSIS, W/ REFLEX TO CULTURE (INFECTION SUSPECTED) - Abnormal; Notable for the following components:   Color, Urine STRAW (*)    Specific Gravity, Urine 1.003 (*)    Hgb urine dipstick SMALL (*)    All other components within normal limits  RESP PANEL BY RT-PCR (RSV, FLU A&B, COVID)  RVPGX2  CULTURE, BLOOD (ROUTINE X 2)  CULTURE, BLOOD (ROUTINE X 2)  TYPE AND SCREEN  PREPARE RBC (CROSSMATCH)  TROPONIN I (HIGH SENSITIVITY)  TROPONIN I (HIGH SENSITIVITY)                                                                                                                          Radiology CT Angio Chest PE W and/or Wo Contrast Result Date: 12/26/2023 CLINICAL DATA:  Concern for pulmonary embolism. History of non-small cell lung cancer. EXAM: CT ANGIOGRAPHY CHEST  WITH CONTRAST TECHNIQUE: Multidetector CT imaging of the chest was performed using the standard protocol during bolus administration of intravenous contrast. Multiplanar CT image reconstructions and MIPs were obtained to evaluate the vascular anatomy. RADIATION DOSE REDUCTION: This exam was performed according to the departmental dose-optimization program which includes automated exposure control, adjustment of the mA and/or kV according to patient size and/or use of iterative reconstruction technique. CONTRAST:  75mL OMNIPAQUE IOHEXOL 350 MG/ML SOLN COMPARISON:  CT dated 11/09/2023. FINDINGS: Cardiovascular: There is no cardiomegaly or pericardial effusion. Mild atherosclerotic calcification of the thoracic aorta. No aneurysmal dilatation or dissection. The origins of the great vessels of the aortic arch appear patent. Left-sided Port-A-Cath with tip at the cavoatrial junction. No pulmonary artery  embolus identified. Mediastinum/Nodes: Right hilar mass encasing the right pulmonary artery and right upper and middle lobe bronchi relatively similar to prior CT. The esophagus is grossly unremarkable. No mediastinal fluid collection. Lungs/Pleura: Multiple bilateral pulmonary nodules similar or slightly decreased since the prior CT consistent with metastatic disease. No consolidative changes. Trace right pleural effusion. No pneumothorax. The central airways are patent. Upper Abdomen: Retroperitoneal adenopathy and left liver masses. Musculoskeletal: Osteopenia with degenerative changes. No acute osseous pathology. Review of the MIP images confirms the above findings. IMPRESSION: 1. No CT evidence of pulmonary artery embolus. 2. Right hilar mass and pulmonary metastasis similar or slightly decreased in size since the prior CT. 3. Retroperitoneal adenopathy and left liver masses. 4.  Aortic Atherosclerosis (ICD10-I70.0). Electronically Signed   By: Elgie Collard M.D.   On: 12/26/2023 14:18   DG Chest 1 View Result Date: 12/26/2023 CLINICAL DATA:  Chest pain, cough, and weakness. EXAM: CHEST  1 VIEW COMPARISON:  11/09/2023. FINDINGS: The heart size and mediastinal contours are within normal limits. There is atherosclerotic calcification of the aorta scattered opacities with right upper lobe and right hilar mass are noted in the lungs bilaterally, compatible with known pulmonary nodules and masses. No effusion or pneumothorax is seen. A left chest port is stable in position. No acute osseous abnormality. IMPRESSION: Scattered opacities in the lungs bilaterally with right upper lobe and right hilar masslike consolidation, increased from the prior exam. Electronically Signed   By: Thornell Sartorius M.D.   On: 12/26/2023 10:46    Pertinent labs & imaging results that were available during my care of the patient were reviewed by me and considered in my medical decision making (see MDM for details).  Medications  Ordered in ED Medications  0.9 %  sodium chloride infusion (Manually program via Guardrails IV Fluids) (0 mLs Intravenous Hold 12/26/23 1416)  lactated ringers bolus 1,000 mL (0 mLs Intravenous Stopped 12/26/23 1324)  heparin lock flush 100 UNIT/ML injection (  Given 12/26/23 1150)  iohexol (OMNIPAQUE) 350 MG/ML injection 75 mL (75 mLs Intravenous Contrast Given 12/26/23 1248)  Procedures .Critical Care  Performed by: Arletha Pili, DO Authorized by: Arletha Pili, DO   Critical care provider statement:    Critical care time (minutes):  30   Critical care was necessary to treat or prevent imminent or life-threatening deterioration of the following conditions: Symptomatic anemia.   Critical care was time spent personally by me on the following activities:  Development of treatment plan with patient or surrogate, discussions with consultants, evaluation of patient's response to treatment, examination of patient, ordering and review of laboratory studies, ordering and review of radiographic studies, ordering and performing treatments and interventions, pulse oximetry, re-evaluation of patient's condition and review of old charts   (including critical care time)  Medical Decision Making / ED Course   This patient presents to the ED for concern of tachycardia, this involves an extensive number of treatment options, and is a complaint that carries with it a high risk of complications and morbidity.  The differential diagnosis includes dehydration, PE, underlying infection, pneumonia, blood loss.  MDM: On exam, patient overall looks quite well.  She is nontoxic, afebrile.  She has a history of pulmonary embolism, on Eliquis.  Given that she is on Eliquis, D-dimer less helpful, we will proceed with CTA of the patient's chest.  Blood cultures ordered abdomen  abundance of caution.  Will provide patient with IV fluids.  She denies any dark stools, blood in her stools.  No abdominal pain.  Patient's chemotherapy could certainly be a cause for her anemia, and seems more likely than GI bleed at this time.  Reassessment 2:30 PM-patient a liter of fluid, does not appear to be dehydrated, and no response with IV fluids.  Her CTA does not show pulmonary embolism, no acute process.  Patient remains afebrile.  I did reach out to her oncology team, and spoke with Dr. Leonides Schanz.  He reviewed the patient, and recommended transfusing the patient.  If the patient was feeling better after transfusion and vital signs had improved, believe patient was appropriate for discharge.  Will sign patient out to Dr. Durwin Nora for reassessment following transfusion.   Additional history obtained: -Additional history obtained from husband at bedside -External records from outside source obtained and reviewed including: Chart review including previous notes, labs, imaging, consultation notes   Lab Tests: -I ordered, reviewed, and interpreted labs.   The pertinent results include:   Labs Reviewed  COMPREHENSIVE METABOLIC PANEL - Abnormal; Notable for the following components:      Result Value   Albumin 3.0 (*)    All other components within normal limits  CBC WITH DIFFERENTIAL/PLATELET - Abnormal; Notable for the following components:   WBC 2.9 (*)    RBC 2.93 (*)    Hemoglobin 8.0 (*)    HCT 25.2 (*)    RDW 16.9 (*)    Platelets 70 (*)    Lymphs Abs 0.3 (*)    All other components within normal limits  URINALYSIS, W/ REFLEX TO CULTURE (INFECTION SUSPECTED) - Abnormal; Notable for the following components:   Color, Urine STRAW (*)    Specific Gravity, Urine 1.003 (*)    Hgb urine dipstick SMALL (*)    All other components within normal limits  RESP PANEL BY RT-PCR (RSV, FLU A&B, COVID)  RVPGX2  CULTURE, BLOOD (ROUTINE X 2)  CULTURE, BLOOD (ROUTINE X 2)  TYPE AND SCREEN   PREPARE RBC (CROSSMATCH)  TROPONIN I (HIGH SENSITIVITY)  TROPONIN I (HIGH SENSITIVITY)      EKG sinus tachycardia  EKG Interpretation Date/Time:    Ventricular Rate:    PR Interval:    QRS Duration:    QT Interval:    QTC Calculation:   R Axis:      Text Interpretation:           Imaging Studies ordered: I ordered imaging studies including CTA of the chest I independently visualized and interpreted imaging. I agree with the radiologist interpretation   Medicines ordered and prescription drug management: Meds ordered this encounter  Medications   lactated ringers bolus 1,000 mL   heparin lock flush 100 UNIT/ML injection    Hyman Bower C: cabinet override   iohexol (OMNIPAQUE) 350 MG/ML injection 75 mL   0.9 %  sodium chloride infusion (Manually program via Guardrails IV Fluids)    -I have reviewed the patients home medicines and have made adjustments as needed  Critical interventions Management of symptomatic anemia    Cardiac Monitoring: The patient was maintained on a cardiac monitor.  I personally viewed and interpreted the cardiac monitored which showed an underlying rhythm of: Sinus tachycardia  Social Determinants of Health:  Factors impacting patients care include: Multiple medical comorbidities including lung cancer   Reevaluation: After the interventions noted above, I reevaluated the patient and found that they have :improved  Co morbidities that complicate the patient evaluation  Past Medical History:  Diagnosis Date   Anemia    Asthma, mild intermittent, well-controlled    GERD (gastroesophageal reflux disease)    Hx of migraines    Hyperlipidemia    Hypertension    Vitamin D deficiency       Dispostion: Signed out to Dr. Durwin Nora pending transfusion and reassessment.    Final Clinical Impression(s) / ED Diagnoses Final diagnoses:  Anemia, unspecified type     @PCDICTATION @    Anders Simmonds T, DO 12/26/23 1509

## 2023-12-26 NOTE — Assessment & Plan Note (Signed)
 Metastatic. Consider onc input to restart Tarceva.

## 2023-12-26 NOTE — ED Triage Notes (Signed)
 Pt sent over from the cancer center. Pt co of chest pain when she coughs and weakness. Denies fever, N/V/D or black stools. Nurse giving report says pt hemoglobin was at 11 last week and dropped to 8.6. Pt reports taking Traseva. Pt also receiving chemo.

## 2023-12-26 NOTE — ED Notes (Signed)
 While assisting pt to bathroom, pt states she feels "off" and "dizzy". Pt required limited assist w/ transfers. RN and ED MD notified.

## 2023-12-27 DIAGNOSIS — J4520 Mild intermittent asthma, uncomplicated: Secondary | ICD-10-CM | POA: Diagnosis present

## 2023-12-27 DIAGNOSIS — D61810 Antineoplastic chemotherapy induced pancytopenia: Secondary | ICD-10-CM | POA: Diagnosis present

## 2023-12-27 DIAGNOSIS — I472 Ventricular tachycardia, unspecified: Secondary | ICD-10-CM | POA: Diagnosis not present

## 2023-12-27 DIAGNOSIS — C3411 Malignant neoplasm of upper lobe, right bronchus or lung: Secondary | ICD-10-CM

## 2023-12-27 DIAGNOSIS — G4700 Insomnia, unspecified: Secondary | ICD-10-CM | POA: Diagnosis present

## 2023-12-27 DIAGNOSIS — F17290 Nicotine dependence, other tobacco product, uncomplicated: Secondary | ICD-10-CM | POA: Diagnosis present

## 2023-12-27 DIAGNOSIS — D6481 Anemia due to antineoplastic chemotherapy: Secondary | ICD-10-CM | POA: Diagnosis present

## 2023-12-27 DIAGNOSIS — Z8249 Family history of ischemic heart disease and other diseases of the circulatory system: Secondary | ICD-10-CM | POA: Diagnosis not present

## 2023-12-27 DIAGNOSIS — D649 Anemia, unspecified: Principal | ICD-10-CM | POA: Insufficient documentation

## 2023-12-27 DIAGNOSIS — Z7901 Long term (current) use of anticoagulants: Secondary | ICD-10-CM | POA: Diagnosis not present

## 2023-12-27 DIAGNOSIS — C787 Secondary malignant neoplasm of liver and intrahepatic bile duct: Secondary | ICD-10-CM | POA: Diagnosis present

## 2023-12-27 DIAGNOSIS — Z7984 Long term (current) use of oral hypoglycemic drugs: Secondary | ICD-10-CM | POA: Diagnosis not present

## 2023-12-27 DIAGNOSIS — I110 Hypertensive heart disease with heart failure: Secondary | ICD-10-CM | POA: Diagnosis not present

## 2023-12-27 DIAGNOSIS — I5021 Acute systolic (congestive) heart failure: Secondary | ICD-10-CM | POA: Diagnosis not present

## 2023-12-27 DIAGNOSIS — D630 Anemia in neoplastic disease: Secondary | ICD-10-CM | POA: Diagnosis present

## 2023-12-27 DIAGNOSIS — I428 Other cardiomyopathies: Secondary | ICD-10-CM | POA: Diagnosis present

## 2023-12-27 DIAGNOSIS — I5020 Unspecified systolic (congestive) heart failure: Secondary | ICD-10-CM | POA: Diagnosis not present

## 2023-12-27 DIAGNOSIS — I700 Atherosclerosis of aorta: Secondary | ICD-10-CM | POA: Diagnosis present

## 2023-12-27 DIAGNOSIS — Z79899 Other long term (current) drug therapy: Secondary | ICD-10-CM | POA: Diagnosis not present

## 2023-12-27 DIAGNOSIS — D61818 Other pancytopenia: Secondary | ICD-10-CM | POA: Diagnosis not present

## 2023-12-27 DIAGNOSIS — I951 Orthostatic hypotension: Secondary | ICD-10-CM | POA: Diagnosis present

## 2023-12-27 DIAGNOSIS — C7951 Secondary malignant neoplasm of bone: Secondary | ICD-10-CM | POA: Diagnosis present

## 2023-12-27 DIAGNOSIS — E785 Hyperlipidemia, unspecified: Secondary | ICD-10-CM | POA: Diagnosis present

## 2023-12-27 DIAGNOSIS — E860 Dehydration: Secondary | ICD-10-CM | POA: Diagnosis present

## 2023-12-27 DIAGNOSIS — E876 Hypokalemia: Secondary | ICD-10-CM | POA: Diagnosis present

## 2023-12-27 DIAGNOSIS — R000 Tachycardia, unspecified: Secondary | ICD-10-CM | POA: Diagnosis not present

## 2023-12-27 DIAGNOSIS — J850 Gangrene and necrosis of lung: Secondary | ICD-10-CM | POA: Diagnosis present

## 2023-12-27 LAB — BASIC METABOLIC PANEL
Anion gap: 9 (ref 5–15)
BUN: 9 mg/dL (ref 8–23)
CO2: 26 mmol/L (ref 22–32)
Calcium: 8.8 mg/dL — ABNORMAL LOW (ref 8.9–10.3)
Chloride: 102 mmol/L (ref 98–111)
Creatinine, Ser: 0.81 mg/dL (ref 0.44–1.00)
GFR, Estimated: >60 mL/min (ref >60)
Glucose, Bld: 83 mg/dL (ref 70–99)
Potassium: 3.3 mmol/L — ABNORMAL LOW (ref 3.5–5.1)
Sodium: 137 mmol/L (ref 135–145)

## 2023-12-27 LAB — PROTIME-INR
INR: 1.7 — ABNORMAL HIGH (ref 0.8–1.2)
Prothrombin Time: 20.1 s — ABNORMAL HIGH (ref 11.4–15.2)

## 2023-12-27 LAB — CBC
HCT: 24.5 % — ABNORMAL LOW (ref 36.0–46.0)
Hemoglobin: 7.7 g/dL — ABNORMAL LOW (ref 12.0–15.0)
MCH: 26.7 pg (ref 26.0–34.0)
MCHC: 31.4 g/dL (ref 30.0–36.0)
MCV: 85.1 fL (ref 80.0–100.0)
Platelets: 78 10*3/uL — ABNORMAL LOW (ref 150–400)
RBC: 2.88 MIL/uL — ABNORMAL LOW (ref 3.87–5.11)
RDW: 18.2 % — ABNORMAL HIGH (ref 11.5–15.5)
WBC: 2.2 10*3/uL — ABNORMAL LOW (ref 4.0–10.5)
nRBC: 0 % (ref 0.0–0.2)

## 2023-12-27 LAB — BRAIN NATRIURETIC PEPTIDE: B Natriuretic Peptide: 73.3 pg/mL (ref 0.0–100.0)

## 2023-12-27 LAB — CORTISOL-AM, BLOOD: Cortisol - AM: 9.5 ug/dL (ref 6.7–22.6)

## 2023-12-27 LAB — PREPARE RBC (CROSSMATCH)

## 2023-12-27 LAB — APTT: aPTT: 36 s (ref 24–36)

## 2023-12-27 MED ORDER — LACTATED RINGERS IV BOLUS
1000.0000 mL | Freq: Once | INTRAVENOUS | Status: AC
  Administered 2023-12-27: 1000 mL via INTRAVENOUS

## 2023-12-27 MED ORDER — LACTATED RINGERS IV SOLN: INTRAVENOUS | Status: AC

## 2023-12-27 MED ORDER — SODIUM CHLORIDE 0.9% FLUSH
10.0000 mL | INTRAVENOUS | Status: DC | PRN
  Administered 2024-01-02: 10 mL

## 2023-12-27 MED ORDER — POTASSIUM CHLORIDE CRYS ER 20 MEQ PO TBCR
40.0000 meq | EXTENDED_RELEASE_TABLET | Freq: Once | ORAL | Status: AC
  Administered 2023-12-27: 40 meq via ORAL
  Filled 2023-12-27: qty 2

## 2023-12-27 MED ORDER — SODIUM CHLORIDE 0.9% FLUSH
10.0000 mL | Freq: Two times a day (BID) | INTRAVENOUS | Status: DC
  Administered 2023-12-27 – 2024-01-01 (×4): 10 mL

## 2023-12-27 MED ORDER — SODIUM CHLORIDE 0.9% IV SOLUTION
Freq: Once | INTRAVENOUS | Status: DC
Start: 2023-12-27 — End: 2023-12-28

## 2023-12-27 MED ORDER — CHLORHEXIDINE GLUCONATE CLOTH 2 % EX PADS
6.0000 | MEDICATED_PAD | Freq: Every day | CUTANEOUS | Status: DC
  Administered 2023-12-28 – 2023-12-31 (×4): 6 via TOPICAL

## 2023-12-27 NOTE — ED Notes (Signed)
 Pt alert, NAD, calm, interactive. Ate cereal. Prednisone given. Pending call back from IV team. Pt mentions mild HA, declines med for same. Denies other sx. Updated. Husband at [facility].

## 2023-12-27 NOTE — Progress Notes (Signed)
 Rebecca Ochoa   DOB:10-11-1961   YQ#:[id number]       ASSESSMENT & PLAN:  1.  Generalized weakness Shortness of breath - Patient reports weakness, shortness of breath and increased heart rate x 3 days - Likely multifactorial due to malignancy, chronic disease and anemia - Supplemental O2 as needed.  Not currently in use. - Supportive care  2.  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  Numerous liver masses within the liver shows decreased size from prior study indicating treatment response. - Status post palliative RT to mediastinal mass, completed 09/04/2023 - Was on Tagrisso 80 mg p.o. daily and recently switched to Tarceva 100 mg p.o. daily. - Concurrent chemo with carboplatin and Alimta started 11/14/2023 every 3 weeks.  Cycle 2 postponed due to fatigue and weakness.  Chemotherapy dosage will be reduced as well.   - On prednisone 5 mg daily, continue - Medical oncology/Dr. Arbutus Ped following  3.  Pancytopenia: Anemia/leukopenia/thrombocytopenia - Likely due to malignancy and chronic disease - Hemoglobin low 7.7 today. - Transfuse PRBC for Hgb <7.0.  Currently receiving PRBC transfusion in the ED due to symptoms. - WBC low at 2.2 today.  No intervention at this time - Platelets low 78K.  Transfuse platelets for counts <20 K or <50 K with active bleeding.  No transfusional intervention at this time - Continue to monitor CBC with differential  4.  History of PE - On anticoagulation with Eliquis, continue as ordered - No active bleeding.  Monitor closely for bleeding  5.  Tachycardia - Heart rate 100-1 20 - On monitor in ED - Monitor closely   Code Status Full  Subjective:  Patient seen awake and alert laying in bed.  Significant other at bedside.   Patient is currently receiving PRBC transfusion.  She denies melena.  States she has not had a bowel movement today however stool is light-colored.  Denies nausea or vomiting or dizziness.  Reports shortness of breath at home, currently does not appear short of breath.  Tachycardia is noted.  No other acute distress at this time.    Objective:  Vitals:   12/27/23 1346 12/27/23 1412  BP: 121/79 129/76  Pulse:  (!) 113  Resp: (!) 21 (!) 27  Temp: 98.5 F (36.9 C) 98.2 F (36.8 C)  SpO2:  98%     Intake/Output Summary (Last 24 hours) at 12/27/2023 1606 Last data filed at 12/27/2023 1323 Gross per 24 hour  Intake 1500 ml  Output --  Net 1500 ml     REVIEW OF SYSTEMS:   Constitutional: Denies fevers, chills or abnormal night sweats Eyes: Denies blurriness of vision, double vision or watery eyes Ears, nose, mouth, throat, and face: Denies mucositis or sore throat Respiratory: Denies cough, dyspnea or wheezes Cardiovascular: Denies palpitation, chest discomfort or lower extremity swelling Gastrointestinal:  Denies nausea, heartburn or change in bowel habits Skin: Denies abnormal skin rashes Lymphatics: Denies new lymphadenopathy or easy bruising Neurological: Denies numbness, tingling or new weaknesses Behavioral/Psych: Mood is stable, no new changes  All other systems were reviewed with the patient and are negative.  PHYSICAL EXAMINATION: ECOG PERFORMANCE STATUS: 2 - Symptomatic, <50% confined to bed  Vitals:   12/27/23 1346 12/27/23 1412  BP: 121/79 129/76  Pulse:  (!) 113  Resp: (!) 21 (!) 27  Temp: 98.5 F (36.9 C) 98.2 F (36.8 C)  SpO2:  98%   Filed Weights   12/26/23 0856  Weight: 129 lb (58.5 kg)    GENERAL: alert, no distress and comfortable SKIN: skin color, texture, turgor are normal, no rashes or significant lesions EYES: normal, conjunctiva are pink and non-injected, sclera clear OROPHARYNX: no exudate, no erythema and lips, buccal mucosa, and tongue  normal  NECK: supple, thyroid normal size, non-tender, without nodularity LYMPH: no palpable lymphadenopathy in the cervical, axillary or inguinal LUNGS: clear to auscultation and percussion with normal breathing effort HEART: regular rate & rhythm and no murmurs and no lower extremity edema ABDOMEN: abdomen soft, non-tender and normal bowel sounds MUSCULOSKELETAL: no cyanosis of digits and no clubbing  PSYCH: alert & oriented x 3 with fluent speech NEURO: no focal motor/sensory deficits   All questions were answered. The patient knows to call the clinic with any problems, questions or concerns.   The total time spent in the appointment was 40 minutes encounter with patient including review of chart and various tests results, discussions about plan of care and coordination of care plan  Dawson Bills, NP 12/27/2023 4:06 PM    Labs Reviewed:  Lab Results  Component Value Date   WBC 2.2 (L) 12/27/2023   HGB 7.7 (L) 12/27/2023   HCT 24.5 (L) 12/27/2023   MCV 85.1 12/27/2023   PLT 78 (L) 12/27/2023   Recent Labs    08/16/23 1036 08/24/23 0819 09/21/23 0444 09/24/23 0500 10/19/23 0712 10/20/23 0742 12/19/23 0837 12/26/23 0755 12/26/23 0936 12/27/23 0658  NA 133*   < > 137   < > 135   < > 135 138 136 137  K 3.4*   < > 3.4*   < > 3.3*   < > 3.2* 4.0 3.7 3.3*  CL 93*   < > 105   < > 101   < > 95* 101 100 102  CO2 29   < > 23   < > 27   < > 31 28 25 26   GLUCOSE 118*   < > 95   < > 120*   < > 124* 95 99 83  BUN 16   < > 6*   < > 18   < > 12 7* 9 9  CREATININE 0.85   < > 0.59   < > 0.48   < > 0.86 0.95 0.93 0.81  CALCIUM 9.0   < > 8.2*   < > 8.7*   < > 9.7 9.4 8.9 8.8*  GFRNONAA >60   < > >60   < > >60   < > >60 >60 >60 >60  PROT 7.0   < >  --    < > 5.0*   < > 6.9 6.9 6.6  --   ALBUMIN 3.3*   < >  --    < > 2.6*   < > 3.6 3.6 3.0*  --   AST 35   < >  --    < > 26   < > 36 22 25  --   ALT 25   < >  --    < > 61*   < > 27 18 21   --   ALKPHOS 91   < >  --    < > 97   < > 113  108 103  --   BILITOT 1.0   < > 0.7   < > 1.1   < >  0.7 0.9 1.2  --   BILIDIR 0.3*  --  <0.1  --  0.3*  --   --   --   --   --   IBILI 0.7  --  NOT CALCULATED  --  0.8  --   --   --   --   --    < > = values in this interval not displayed.    Studies Reviewed:  CT ABDOMEN PELVIS WO CONTRAST Result Date: 12/26/2023 CLINICAL DATA:  History of non-small cell lung cancer, abdominal pain, anemia EXAM: CT ABDOMEN AND PELVIS WITHOUT CONTRAST TECHNIQUE: Multidetector CT imaging of the abdomen and pelvis was performed following the standard protocol without IV contrast. RADIATION DOSE REDUCTION: This exam was performed according to the departmental dose-optimization program which includes automated exposure control, adjustment of the mA and/or kV according to patient size and/or use of iterative reconstruction technique. COMPARISON:  11/09/2023, 12/26/2023 FINDINGS: Lower chest: Multiple pulmonary nodules and right hilar mass are seen consistent with known metastatic lung cancer. Please see CT chest performed earlier today. Hepatobiliary: Numerous hypodense masses are seen throughout the liver, demonstrating decreased size and central necrosis since prior study. Index lesion in the left lobe liver measures 6.0 x 3.5 cm reference image 10/2, previously 8.3 x 4.5 cm. No evidence of cholelithiasis or cholecystitis. No evidence of biliary duct dilation. Pancreas: Unremarkable. No pancreatic ductal dilatation or surrounding inflammatory changes. Spleen: Normal in size without focal abnormality. The hypodense lesions seen on prior study, and visualized on earlier CT chest performed today, are not well visualized on this exam likely due to phase of contrast enhancement. Adrenals/Urinary Tract: Numerous indeterminate bilateral renal hypodensities are again noted. Index lesion in the interpolar right kidney reference image 17/2 measures 1.7 cm, previously measuring 1.8 cm. No hydronephrosis or nephrolithiasis. The adrenals  are unremarkable. Excreted contrast within the urinary bladder with no filling defect identified. Stomach/Bowel: No bowel obstruction or ileus. Normal appendix right lower quadrant. Diverticulosis of the sigmoid colon without evidence of acute diverticulitis. Vascular/Lymphatic: Stable aortic atherosclerosis. There is persistent adenopathy at the porta hepatis, with decreased areas of necrosis and cavitation since prior study. Index lymph node on image 16/2 measures 2.4 cm in short axis, unchanged by my measurements since prior. No new adenopathy identified. Reproductive: Uterus and bilateral adnexa are unremarkable. Other: No free fluid or free intraperitoneal gas. No abdominal wall hernia. Musculoskeletal: No acute or destructive bony abnormalities. Reconstructed images demonstrate no additional findings. IMPRESSION: 1. Right hilar mass and numerous bilateral pulmonary nodules consistent with known history of metastatic lung cancer. Please see chest CT performed earlier today. 2. Numerous hypodense masses within the liver, demonstrating decrease in size and resolution of central necrosis/cavitation on prior study. 3. Continued portacaval adenopathy, without significant change in size of lymph nodes. The central cavitation/necrosis has resolved. 4. Numerous bilateral renal hypodensities, too small to characterize, but grossly stable since prior exam. Metastatic disease remains the diagnosis of exclusion. 5. The splenic hypodensity seen on previous study and visualized on earlier chest CT today are not well visualized on this exam, likely due to delayed imaging relative to contrast administration performed earlier for chest CT. 6. Sigmoid diverticulosis without diverticulitis. 7. Aortic Atherosclerosis (ICD10-I70.0). Electronically Signed   By: Sharlet Salina M.D.   On: 12/26/2023 18:40   CT Angio Chest PE W and/or Wo Contrast Result Date: 12/26/2023 CLINICAL DATA:  Concern for pulmonary embolism. History of  non-small cell lung cancer. EXAM: CT ANGIOGRAPHY CHEST WITH CONTRAST TECHNIQUE:  Multidetector CT imaging of the chest was performed using the standard protocol during bolus administration of intravenous contrast. Multiplanar CT image reconstructions and MIPs were obtained to evaluate the vascular anatomy. RADIATION DOSE REDUCTION: This exam was performed according to the departmental dose-optimization program which includes automated exposure control, adjustment of the mA and/or kV according to patient size and/or use of iterative reconstruction technique. CONTRAST:  75mL OMNIPAQUE IOHEXOL 350 MG/ML SOLN COMPARISON:  CT dated 11/09/2023. FINDINGS: Cardiovascular: There is no cardiomegaly or pericardial effusion. Mild atherosclerotic calcification of the thoracic aorta. No aneurysmal dilatation or dissection. The origins of the great vessels of the aortic arch appear patent. Left-sided Port-A-Cath with tip at the cavoatrial junction. No pulmonary artery embolus identified. Mediastinum/Nodes: Right hilar mass encasing the right pulmonary artery and right upper and middle lobe bronchi relatively similar to prior CT. The esophagus is grossly unremarkable. No mediastinal fluid collection. Lungs/Pleura: Multiple bilateral pulmonary nodules similar or slightly decreased since the prior CT consistent with metastatic disease. No consolidative changes. Trace right pleural effusion. No pneumothorax. The central airways are patent. Upper Abdomen: Retroperitoneal adenopathy and left liver masses. Musculoskeletal: Osteopenia with degenerative changes. No acute osseous pathology. Review of the MIP images confirms the above findings. IMPRESSION: 1. No CT evidence of pulmonary artery embolus. 2. Right hilar mass and pulmonary metastasis similar or slightly decreased in size since the prior CT. 3. Retroperitoneal adenopathy and left liver masses. 4.  Aortic Atherosclerosis (ICD10-I70.0). Electronically Signed   By: Elgie Collard  M.D.   On: 12/26/2023 14:18   DG Chest 1 View Result Date: 12/26/2023 CLINICAL DATA:  Chest pain, cough, and weakness. EXAM: CHEST  1 VIEW COMPARISON:  11/09/2023. FINDINGS: The heart size and mediastinal contours are within normal limits. There is atherosclerotic calcification of the aorta scattered opacities with right upper lobe and right hilar mass are noted in the lungs bilaterally, compatible with known pulmonary nodules and masses. No effusion or pneumothorax is seen. A left chest port is stable in position. No acute osseous abnormality. IMPRESSION: Scattered opacities in the lungs bilaterally with right upper lobe and right hilar masslike consolidation, increased from the prior exam. Electronically Signed   By: Thornell Sartorius M.D.   On: 12/26/2023 10:46

## 2023-12-27 NOTE — Hospital Course (Addendum)
 63 year old woman PMH metastatic lung cancer currently undergoing chemotherapy followed by Dr. Shirline Frees, went to the cancer center 2/24, reported weakness and worsening shortness of breath, presyncopal, noted to be tachycardic and sent to the emergency department.  Treated for symptomatic anemia and dehydration with aggressive IV fluids and PRBCs 2 units without effect.  Further review of chart showed persistent tachycardia since mid December at least on multiple visits.  Echocardiogram revealed systolic dysfunction, cardiology consulted, started on beta-blocker.  [facility]ult[facility] Cardiology  Procedures/Events

## 2023-12-27 NOTE — ED Notes (Signed)
Up to [facility] with assist.

## 2023-12-27 NOTE — Progress Notes (Deleted)
 Rebecca Ochoa   DOB:06-Dec-1960   ZO#:[id number]      ASSESSMENT & PLAN:  1.  Generalized weakness Shortness of breath - Patient reports weakness, shortness of breath and increased heart rate x 3 days - Likely multifactorial due to malignancy, chronic disease and anemia - Supplemental O2 as needed.  Not currently in use. - Supportive care  2.  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  Numerous liver masses within the liver shows decreased size from prior study indicating treatment response. - Status post palliative RT to mediastinal mass, completed 09/04/2023 - Was on Tagrisso 80 mg p.o. daily and recently switched to Tarceva 100 mg p.o. daily. - Concurrent chemo with carboplatin and Alimta started 11/14/2023 every 3 weeks.  Cycle 2 postponed due to fatigue and weakness.  Chemotherapy dosage will be reduced as well.   - On prednisone 5 mg daily, continue - Medical oncology/Dr. Arbutus Ped following  3.  Pancytopenia: Anemia/leukopenia/thrombocytopenia - Likely due to malignancy and chronic disease - Hemoglobin low 7.7 today. - Transfuse PRBC for Hgb <7.0.  Currently receiving PRBC transfusion in the ED due to symptoms. - WBC low at 2.2 today.  No intervention at this time - Platelets low 78K.  Transfuse platelets for counts <20 K or <50 K with active bleeding.  No transfusional intervention at this time - Continue to monitor CBC with differential  4.  History of PE - On anticoagulation with Eliquis, continue as ordered - No active bleeding.  Monitor closely for bleeding  5.  Tachycardia - Heart rate 100-1 20 - On monitor in ED - Monitor closely   Code Status Full  Subjective:  Patient seen awake and alert laying in bed.  Significant other at bedside.   Patient is currently receiving PRBC transfusion.  She denies melena.  States she has not had a bowel movement today however stool is light-colored.  Denies nausea or vomiting or dizziness.  Reports shortness of breath at home, currently does not appear short of breath.  Tachycardia is noted.  No other acute distress at this time.  Objective:  Vitals:   12/27/23 1100 12/27/23 1200  BP: 119/73 123/69  Pulse: (!) 110 (!) 110  Resp: (!) 23 (!) 32  Temp:    SpO2: 96% 97%     Intake/Output Summary (Last 24 hours) at 12/27/2023 1316 Last data filed at 12/26/2023 1855 Gross per 24 hour  Intake 1500 ml  Output --  Net 1500 ml     REVIEW OF SYSTEMS:   Constitutional: Denies fevers, chills or abnormal night sweats Eyes: Denies blurriness of vision, double vision or watery eyes Ears, nose, mouth, throat, and face: Denies mucositis or sore throat Respiratory: + Shortness of breath Cardiovascular: + palpitation, chest discomfort or lower extremity swelling Gastrointestinal:  Denies nausea, heartburn or change in bowel habits Skin: Denies abnormal skin rashes Lymphatics: Denies new lymphadenopathy or easy bruising Neurological: Denies numbness, tingling or new weaknesses Behavioral/Psych: Mood is stable, no new changes  All other systems were reviewed with the patient and are negative.  PHYSICAL EXAMINATION: ECOG PERFORMANCE STATUS: 3 - Symptomatic, >50% confined to bed  Vitals:   12/27/23 1100 12/27/23 1200  BP: 119/73 123/69  Pulse: (!) 110 (!) 110  Resp: (!) 23 (!) 32  Temp:    SpO2: 96% 97%  Filed Weights   12/26/23 0856  Weight: 129 lb (58.5 kg)    GENERAL: alert, + mild distress  SKIN: + Pale skin color, texture, turgor are normal, no rashes or significant lesions EYES: normal, conjunctiva are pink and non-injected, sclera clear OROPHARYNX: no exudate, no erythema and lips, buccal mucosa, and tongue normal  NECK: supple, thyroid normal size, non-tender, without  nodularity LYMPH: no palpable lymphadenopathy in the cervical, axillary or inguinal LUNGS: + Diminished to auscultation + HF [place]  HEART: regular rate & rhythm and no murmurs and no lower extremity edema ABDOMEN: abdomen soft, non-tender and normal bowel sounds MUSCULOSKELETAL: no cyanosis of digits and no clubbing  PSYCH: alert & oriented x 3 with fluent speech NEURO: no focal motor/sensory deficits   All questions were answered. The patient knows to call the clinic with any problems, questions or concerns.   The total time spent in the appointment was 40 minutes encounter with patient including review of chart and various tests results, discussions about plan of care and coordination of care plan  Dawson Bills, NP 12/27/2023 1:16 PM    Labs Reviewed:  Lab Results  Component Value Date   WBC 2.2 (L) 12/27/2023   HGB 7.7 (L) 12/27/2023   HCT 24.5 (L) 12/27/2023   MCV 85.1 12/27/2023   PLT 78 (L) 12/27/2023   Recent Labs    08/16/23 1036 08/24/23 0819 09/21/23 0444 09/24/23 0500 10/19/23 0712 10/20/23 0742 12/19/23 0837 12/26/23 0755 12/26/23 0936 12/27/23 0658  NA 133*   < > 137   < > 135   < > 135 138 136 137  K 3.4*   < > 3.4*   < > 3.3*   < > 3.2* 4.0 3.7 3.3*  CL 93*   < > 105   < > 101   < > 95* 101 100 102  CO2 29   < > 23   < > 27   < > 31 28 25 26   GLUCOSE 118*   < > 95   < > 120*   < > 124* 95 99 83  BUN 16   < > 6*   < > 18   < > 12 7* 9 9  CREATININE 0.85   < > 0.59   < > 0.48   < > 0.86 0.95 0.93 0.81  CALCIUM 9.0   < > 8.2*   < > 8.7*   < > 9.7 9.4 8.9 8.8*  GFRNONAA >60   < > >60   < > >60   < > >60 >60 >60 >60  PROT 7.0   < >  --    < > 5.0*   < > 6.9 6.9 6.6  --   ALBUMIN 3.3*   < >  --    < > 2.6*   < > 3.6 3.6 3.0*  --   AST 35   < >  --    < > 26   < > 36 22 25  --   ALT 25   < >  --    < > 61*   < > 27 18 21   --   ALKPHOS 91   < >  --    < > 97   < > 113 108 103  --   BILITOT 1.0   < > 0.7   < > 1.1   < > 0.7 0.9 1.2  --   BILIDIR 0.3*  --  <  0.1   --  0.3*  --   --   --   --   --   IBILI 0.7  --  NOT CALCULATED  --  0.8  --   --   --   --   --    < > = values in this interval not displayed.    Studies Reviewed:  CT ABDOMEN PELVIS WO CONTRAST Result Date: 12/26/2023 CLINICAL DATA:  History of non-small cell lung cancer, abdominal pain, anemia EXAM: CT ABDOMEN AND PELVIS WITHOUT CONTRAST TECHNIQUE: Multidetector CT imaging of the abdomen and pelvis was performed following the standard protocol without IV contrast. RADIATION DOSE REDUCTION: This exam was performed according to the departmental dose-optimization program which includes automated exposure control, adjustment of the mA and/or kV according to patient size and/or use of iterative reconstruction technique. COMPARISON:  11/09/2023, 12/26/2023 FINDINGS: Lower chest: Multiple pulmonary nodules and right hilar mass are seen consistent with known metastatic lung cancer. Please see CT chest performed earlier today. Hepatobiliary: Numerous hypodense masses are seen throughout the liver, demonstrating decreased size and central necrosis since prior study. Index lesion in the left lobe liver measures 6.0 x 3.5 cm reference image 10/2, previously 8.3 x 4.5 cm. No evidence of cholelithiasis or cholecystitis. No evidence of biliary duct dilation. Pancreas: Unremarkable. No pancreatic ductal dilatation or surrounding inflammatory changes. Spleen: Normal in size without focal abnormality. The hypodense lesions seen on prior study, and visualized on earlier CT chest performed today, are not well visualized on this exam likely due to phase of contrast enhancement. Adrenals/Urinary Tract: Numerous indeterminate bilateral renal hypodensities are again noted. Index lesion in the interpolar right kidney reference image 17/2 measures 1.7 cm, previously measuring 1.8 cm. No hydronephrosis or nephrolithiasis. The adrenals are unremarkable. Excreted contrast within the urinary bladder with no filling defect  identified. Stomach/Bowel: No bowel obstruction or ileus. Normal appendix right lower quadrant. Diverticulosis of the sigmoid colon without evidence of acute diverticulitis. Vascular/Lymphatic: Stable aortic atherosclerosis. There is persistent adenopathy at the porta hepatis, with decreased areas of necrosis and cavitation since prior study. Index lymph node on image 16/2 measures 2.4 cm in short axis, unchanged by my measurements since prior. No new adenopathy identified. Reproductive: Uterus and bilateral adnexa are unremarkable. Other: No free fluid or free intraperitoneal gas. No abdominal wall hernia. Musculoskeletal: No acute or destructive bony abnormalities. Reconstructed images demonstrate no additional findings. IMPRESSION: 1. Right hilar mass and numerous bilateral pulmonary nodules consistent with known history of metastatic lung cancer. Please see chest CT performed earlier today. 2. Numerous hypodense masses within the liver, demonstrating decrease in size and resolution of central necrosis/cavitation on prior study. 3. Continued portacaval adenopathy, without significant change in size of lymph nodes. The central cavitation/necrosis has resolved. 4. Numerous bilateral renal hypodensities, too small to characterize, but grossly stable since prior exam. Metastatic disease remains the diagnosis of exclusion. 5. The splenic hypodensity seen on previous study and visualized on earlier chest CT today are not well visualized on this exam, likely due to delayed imaging relative to contrast administration performed earlier for chest CT. 6. Sigmoid diverticulosis without diverticulitis. 7. Aortic Atherosclerosis (ICD10-I70.0). Electronically Signed   By: Sharlet Salina M.D.   On: 12/26/2023 18:40   CT Angio Chest PE W and/or Wo Contrast Result Date: 12/26/2023 CLINICAL DATA:  Concern for pulmonary embolism. History of non-small cell lung cancer. EXAM: CT ANGIOGRAPHY CHEST WITH CONTRAST TECHNIQUE:  Multidetector CT imaging of the chest was performed using the standard  protocol during bolus administration of intravenous contrast. Multiplanar CT image reconstructions and MIPs were obtained to evaluate the vascular anatomy. RADIATION DOSE REDUCTION: This exam was performed according to the departmental dose-optimization program which includes automated exposure control, adjustment of the mA and/or kV according to patient size and/or use of iterative reconstruction technique. CONTRAST:  75mL OMNIPAQUE IOHEXOL 350 MG/ML SOLN COMPARISON:  CT dated 11/09/2023. FINDINGS: Cardiovascular: There is no cardiomegaly or pericardial effusion. Mild atherosclerotic calcification of the thoracic aorta. No aneurysmal dilatation or dissection. The origins of the great vessels of the aortic arch appear patent. Left-sided Port-A-Cath with tip at the cavoatrial junction. No pulmonary artery embolus identified. Mediastinum/Nodes: Right hilar mass encasing the right pulmonary artery and right upper and middle lobe bronchi relatively similar to prior CT. The esophagus is grossly unremarkable. No mediastinal fluid collection. Lungs/Pleura: Multiple bilateral pulmonary nodules similar or slightly decreased since the prior CT consistent with metastatic disease. No consolidative changes. Trace right pleural effusion. No pneumothorax. The central airways are patent. Upper Abdomen: Retroperitoneal adenopathy and left liver masses. Musculoskeletal: Osteopenia with degenerative changes. No acute osseous pathology. Review of the MIP images confirms the above findings. IMPRESSION: 1. No CT evidence of pulmonary artery embolus. 2. Right hilar mass and pulmonary metastasis similar or slightly decreased in size since the prior CT. 3. Retroperitoneal adenopathy and left liver masses. 4.  Aortic Atherosclerosis (ICD10-I70.0). Electronically Signed   By: Elgie Collard M.D.   On: 12/26/2023 14:18   DG Chest 1 View Result Date: 12/26/2023 CLINICAL  DATA:  Chest pain, cough, and weakness. EXAM: CHEST  1 VIEW COMPARISON:  11/09/2023. FINDINGS: The heart size and mediastinal contours are within normal limits. There is atherosclerotic calcification of the aorta scattered opacities with right upper lobe and right hilar mass are noted in the lungs bilaterally, compatible with known pulmonary nodules and masses. No effusion or pneumothorax is seen. A left chest port is stable in position. No acute osseous abnormality. IMPRESSION: Scattered opacities in the lungs bilaterally with right upper lobe and right hilar masslike consolidation, increased from the prior exam. Electronically Signed   By: Thornell Sartorius M.D.   On: 12/26/2023 10:46

## 2023-12-27 NOTE — Progress Notes (Signed)
  Progress Note   Patient: Rebecca Ochoa VWU:[id number] DOB: 1961/07/17 DOA: 12/26/2023     0 DOS: the patient was seen and examined on 12/27/2023   Brief hospital course: 63 year old woman PMH metastatic lung cancer currently undergoing chemotherapy followed by Dr. Shirline Frees, went to the cancer center 2/24, reported weakness and worsening shortness of breath, presyncopal, noted to be tachycardic and sent to the emergency department.  Consultants Dr. Arbutus Ped added to the treatment team.  Procedures/Events   Assessment and Plan: Symptomatic anemia secondary to chemotherapy, anemia of chronic disease Marked sinus tachycardia Metastatic lung cancer Workup reassuring included negative CT PE, negative influenza, RSV and COVID.  Troponins negative. Despite 2 L IV fluids and 1 unit blood remains symptomatic, short of breath and tachycardic.  Reports similar episodes previously secondary to anemia. Poor response to 1 unit PRBC, no evidence of bleeding, likely hemoglobin was artificially high on admission. Given symptomatic and tachycardic, transfuse 1 unit additional blood and bolus 1 L IV fluid and restart maintenance. Dr. Shirline Frees been notified of admission. .  Current chronic use of systemic steroids Patient on prednisone 5mg  . Indication not apparent.   PMH pulmonary embolism (HCC) No evidence of recurrence on CT.  Continue apixaban.  Aortic atherosclerosis      Subjective:  Feels somewhat better.  Has chronic shortness of breath.  Heart rate still high.  Physical Exam: Vitals:   12/27/23 0845 12/27/23 0900 12/27/23 0913 12/27/23 0915  BP:  (!) 142/82    Pulse: (!) 126 (!) 117  (!) 110  Resp: (!) 22 (!) 25  (!) 29  Temp:   98.5 F (36.9 C)   TempSrc:   Oral   SpO2: 98% 98%  98%  Weight:      Height:       Physical Exam Vitals reviewed.  Constitutional:      General: She is not in acute distress.    Appearance: She is not ill-appearing or toxic-appearing.  Cardiovascular:      Rate and Rhythm: Regular rhythm. Tachycardia present.     Heart sounds: No murmur heard.    Comments: Sinus tachycardia on monitor Pulmonary:     Effort: Pulmonary effort is normal. No respiratory distress.  Abdominal:     Palpations: Abdomen is soft.  Musculoskeletal:     Right lower leg: No edema.     Left lower leg: No edema.  Neurological:     Mental Status: She is alert.  Psychiatric:        Mood and Affect: Mood normal.        Behavior: Behavior normal.     Data Reviewed: Potassium 3.3 Magnesium was 1.2 yesterday (was repleted) Hemoglobin 8.6, 8.0 pretransfusion > 7.7 posttransfusion.  Family Communication: brother at beside  Disposition: Status is: Observation   Planned Discharge Destination: Home    Time spent: 25 minutes  Author: Brendia Sacks, MD 12/27/2023 11:20 AM  For on call review [email or web address].

## 2023-12-27 NOTE — ED Notes (Addendum)
 Port access currently clotted, will not draw or flush. IV team paged. Blood draw from port ~55 minutes ago

## 2023-12-28 DIAGNOSIS — R000 Tachycardia, unspecified: Secondary | ICD-10-CM | POA: Diagnosis not present

## 2023-12-28 DIAGNOSIS — D649 Anemia, unspecified: Secondary | ICD-10-CM | POA: Diagnosis not present

## 2023-12-28 DIAGNOSIS — C3411 Malignant neoplasm of upper lobe, right bronchus or lung: Secondary | ICD-10-CM | POA: Diagnosis not present

## 2023-12-28 LAB — ACTH: C206 ACTH: 2.5 pg/mL — ABNORMAL LOW (ref 7.2–63.3)

## 2023-12-28 LAB — BPAM RBC
Blood Product Expiration Date: 202503182359
Blood Product Expiration Date: 202503182359
ISSUE DATE / TIME: 202502261330
ISSUE DATE / TIME: 202502251610
Unit Type and Rh: 7300
Unit Type and Rh: 7300

## 2023-12-28 LAB — BASIC METABOLIC PANEL
Anion gap: 9 (ref 5–15)
Anion gap: 5 (ref 5–15)
BUN: 8 mg/dL (ref 8–23)
BUN: 7 mg/dL — ABNORMAL LOW (ref 8–23)
CO2: 25 mmol/L (ref 22–32)
CO2: 25 mmol/L (ref 22–32)
Calcium: 8.5 mg/dL — ABNORMAL LOW (ref 8.9–10.3)
Calcium: 8.5 mg/dL — ABNORMAL LOW (ref 8.9–10.3)
Chloride: 102 mmol/L (ref 98–111)
Chloride: 105 mmol/L (ref 98–111)
Creatinine, Ser: 0.78 mg/dL (ref 0.44–1.00)
Creatinine, Ser: 0.63 mg/dL (ref 0.44–1.00)
GFR, Estimated: >60 mL/min (ref >60)
GFR, Estimated: >60 mL/min (ref >60)
Glucose, Bld: 82 mg/dL (ref 70–99)
Glucose, Bld: 95 mg/dL (ref 70–99)
Potassium: 3 mmol/L — ABNORMAL LOW (ref 3.5–5.1)
Potassium: 3.8 mmol/L (ref 3.5–5.1)
Sodium: 136 mmol/L (ref 135–145)
Sodium: 135 mmol/L (ref 135–145)

## 2023-12-28 LAB — TYPE AND SCREEN
ABO/RH(D): B POS
Antibody Screen: NEGATIVE
Unit division: 0
Unit division: 0

## 2023-12-28 LAB — CBC
HCT: 29.3 % — ABNORMAL LOW (ref 36.0–46.0)
Hemoglobin: 9.4 g/dL — ABNORMAL LOW (ref 12.0–15.0)
MCH: 27.1 pg (ref 26.0–34.0)
MCHC: 32.1 g/dL (ref 30.0–36.0)
MCV: 84.4 fL (ref 80.0–100.0)
Platelets: 102 10*3/uL — ABNORMAL LOW (ref 150–400)
RBC: 3.47 MIL/uL — ABNORMAL LOW (ref 3.87–5.11)
RDW: 17.2 % — ABNORMAL HIGH (ref 11.5–15.5)
WBC: 2.1 10*3/uL — ABNORMAL LOW (ref 4.0–10.5)
nRBC: 0 % (ref 0.0–0.2)

## 2023-12-28 LAB — MAGNESIUM
Magnesium: 1.6 mg/dL — ABNORMAL LOW (ref 1.7–2.4)
Magnesium: 1.9 mg/dL (ref 1.7–2.4)

## 2023-12-28 LAB — PHOSPHORUS: Phosphorus: 2.6 mg/dL (ref 2.5–4.6)

## 2023-12-28 MED ORDER — POTASSIUM CHLORIDE CRYS ER 20 MEQ PO TBCR
40.0000 meq | EXTENDED_RELEASE_TABLET | Freq: Four times a day (QID) | ORAL | Status: AC
  Administered 2023-12-28 (×2): 40 meq via ORAL
  Filled 2023-12-28 (×2): qty 2

## 2023-12-28 MED ORDER — MAGNESIUM SULFATE 2 GM/50ML IV SOLN
2.0000 g | Freq: Once | INTRAVENOUS | Status: AC
  Administered 2023-12-28: 2 g via INTRAVENOUS
  Filled 2023-12-28: qty 50

## 2023-12-28 MED ORDER — CARMEX CLASSIC LIP BALM EX OINT
1.0000 | TOPICAL_OINTMENT | CUTANEOUS | Status: DC | PRN
  Administered 2023-12-28: 1 via TOPICAL
  Filled 2023-12-28: qty 10

## 2023-12-28 MED ORDER — POTASSIUM CHLORIDE CRYS ER 20 MEQ PO TBCR
40.0000 meq | EXTENDED_RELEASE_TABLET | Freq: Four times a day (QID) | ORAL | Status: AC
Start: 2023-12-28 — End: 2023-12-29
  Administered 2023-12-28 – 2023-12-29 (×2): 40 meq via ORAL
  Filled 2023-12-28 (×2): qty 2

## 2023-12-28 MED ORDER — LACTATED RINGERS IV SOLN: INTRAVENOUS | Status: AC

## 2023-12-28 MED ORDER — LACTATED RINGERS IV BOLUS
1000.0000 mL | Freq: Once | INTRAVENOUS | Status: AC
  Administered 2023-12-28: 1000 mL via INTRAVENOUS

## 2023-12-28 NOTE — Progress Notes (Addendum)
  Progress Note   Patient: Rebecca Ochoa UUV:[id number] DOB: [birth year] DOA: 12/26/2023     1 DOS: the patient was seen and examined on 12/28/2023   Brief hospital course: 63 year old woman PMH metastatic lung cancer currently undergoing chemotherapy followed by Dr. Shirline Frees, went to the cancer center 2/24, reported weakness and worsening shortness of breath, presyncopal, noted to be tachycardic and sent to the emergency department.  Consultants Dr. Arbutus Ped added to the treatment team.  Procedures/Events   Assessment and Plan: Symptomatic anemia secondary to chemotherapy, anemia of chronic disease Marked sinus tachycardia Metastatic lung cancer Workup reassuring included negative CT PE, negative influenza, RSV and COVID.  Troponins negative. Poor response to 1 unit PRBC, no evidence of bleeding, likely hemoglobin was artificially high on admission. Given symptomatic and tachycardic, transfused 1 unit additional blood and bolus 1 L IV fluid. Tachycardic with heart rate in the 120s while resting.  Remains sinus tachycardia.  No signs or symptoms of infection.  She is not on any medications for rate control normally. Will give additional fluid and replace electrolytes. .  Current chronic use of systemic steroids Patient on prednisone 5mg  . Indication not apparent.   Antineoplastic chemotherapy induced pancytopenia    PMH pulmonary embolism (HCC) No evidence of recurrence on CT.  Continue apixaban.   Aortic atherosclerosis  Hypokalemia, hypomagnesemia.  Repleted.    Subjective:  Feels about the same Gets tachy with movement Frustrated HR is still high  Physical Exam: Vitals:   12/27/23 2003 12/28/23 0640 12/28/23 1211 12/28/23 1435  BP: 121/87 (!) 148/76 (!) 145/86 (!) 150/96  Pulse: (!) 132 (!) 107 (!) 114 (!) 122  Resp: 18 18 18    Temp: 98.4 F (36.9 C) 98.4 F (36.9 C) 98.9 F (37.2 C) 99.2 F (37.3 C)  TempSrc:  Oral Oral Oral  SpO2: 98% 97% 98% 98%  Weight:       Height:       Physical Exam Vitals reviewed.  Constitutional:      General: She is not in acute distress.    Appearance: She is not ill-appearing or toxic-appearing.  Cardiovascular:     Rate and Rhythm: Normal rate and regular rhythm.     Heart sounds: No murmur heard. Pulmonary:     Effort: Pulmonary effort is normal. No respiratory distress.     Breath sounds: No wheezing, rhonchi or rales.  Neurological:     Mental Status: She is alert.  Psychiatric:        Mood and Affect: Mood normal.        Behavior: Behavior normal.     Data Reviewed: BMP noted this AM Repeat w/ K+ 3.8 Phos WNL Mg 1.9 on repeat  Family Communication: husband at bedside  Disposition: Status is: Inpatient Remains inpatient appropriate because: tachycardic     Time spent: 35 minutes  Author: Brendia Sacks, MD 12/28/2023 5:49 PM  For on call review [email or web address].

## 2023-12-29 ENCOUNTER — Inpatient Hospital Stay (HOSPITAL_COMMUNITY): Payer: No Typology Code available for payment source

## 2023-12-29 DIAGNOSIS — R000 Tachycardia, unspecified: Secondary | ICD-10-CM | POA: Diagnosis not present

## 2023-12-29 DIAGNOSIS — I951 Orthostatic hypotension: Secondary | ICD-10-CM

## 2023-12-29 DIAGNOSIS — I428 Other cardiomyopathies: Secondary | ICD-10-CM

## 2023-12-29 DIAGNOSIS — C3411 Malignant neoplasm of upper lobe, right bronchus or lung: Secondary | ICD-10-CM | POA: Diagnosis not present

## 2023-12-29 DIAGNOSIS — I4729 Other ventricular tachycardia: Secondary | ICD-10-CM | POA: Insufficient documentation

## 2023-12-29 DIAGNOSIS — D649 Anemia, unspecified: Secondary | ICD-10-CM | POA: Diagnosis not present

## 2023-12-29 LAB — PHOSPHORUS: Phosphorus: 3.1 mg/dL (ref 2.5–4.6)

## 2023-12-29 LAB — BASIC METABOLIC PANEL
Anion gap: 7 (ref 5–15)
BUN: <5 mg/dL — ABNORMAL LOW (ref 8–23)
CO2: 24 mmol/L (ref 22–32)
Calcium: 8.8 mg/dL — ABNORMAL LOW (ref 8.9–10.3)
Chloride: 106 mmol/L (ref 98–111)
Creatinine, Ser: 0.67 mg/dL (ref 0.44–1.00)
GFR, Estimated: >60 mL/min (ref >60)
Glucose, Bld: 82 mg/dL (ref 70–99)
Potassium: 4.2 mmol/L (ref 3.5–5.1)
Sodium: 137 mmol/L (ref 135–145)

## 2023-12-29 LAB — ECHOCARDIOGRAM LIMITED
Calc EF: 35.6 %
Height: 65 in
S' Lateral: 4 cm
Single Plane A2C EF: 13.3 %
Single Plane A4C EF: 41.7 %
Weight: 2064 [oz_av]

## 2023-12-29 LAB — MAGNESIUM: Magnesium: 2.2 mg/dL (ref 1.7–2.4)

## 2023-12-29 MED ORDER — METOPROLOL TARTRATE 25 MG PO TABS
25.0000 mg | ORAL_TABLET | Freq: Four times a day (QID) | ORAL | Status: DC
  Administered 2023-12-29 (×2): 25 mg via ORAL
  Filled 2023-12-29 (×2): qty 1

## 2023-12-29 MED ORDER — ALPRAZOLAM 0.5 MG PO TABS
0.5000 mg | ORAL_TABLET | Freq: Three times a day (TID) | ORAL | Status: DC | PRN
  Administered 2023-12-29 – 2024-01-01 (×4): 0.5 mg via ORAL
  Filled 2023-12-29 (×4): qty 1

## 2023-12-29 MED ORDER — PERFLUTREN LIPID MICROSPHERE
1.0000 mL | INTRAVENOUS | Status: AC | PRN
  Administered 2023-12-29: 2 mL via INTRAVENOUS

## 2023-12-29 MED ORDER — METOPROLOL TARTRATE 25 MG PO TABS: 25.0000 mg | ORAL_TABLET | Freq: Two times a day (BID) | ORAL | Status: DC

## 2023-12-29 MED ORDER — LACTATED RINGERS IV BOLUS
1000.0000 mL | Freq: Once | INTRAVENOUS | Status: AC
  Administered 2023-12-29: 1000 mL via INTRAVENOUS

## 2023-12-29 NOTE — Progress Notes (Signed)
 Progress Note   Patient: Rebecca Ochoa ZOX:[id number] DOB: August 06, 1961 DOA: 12/26/2023     2 DOS: the patient was seen and examined on 12/29/2023   Brief hospital course: 63 year old woman PMH metastatic lung cancer currently undergoing chemotherapy followed by Dr. Shirline Frees, went to the cancer center 2/24, reported weakness and worsening shortness of breath, presyncopal, noted to be tachycardic and sent to the emergency department.  Consultants Dr. Arbutus Ped added to the treatment team.  Procedures/Events    Assessment and Plan: Symptomatic anemia secondary to chemotherapy, anemia of chronic disease Metastatic lung cancer Workup reassuring included negative CT PE, negative influenza, RSV and COVID.  Troponins negative. No improvement with 2 units PRBC and aggressive IV fluids. Tachycardic with heart rate in the 120s while resting.  Remains sinus tachycardia.  No signs or symptoms of infection.  She is not on any medications for rate control normally.  Marked sinus tachycardia Heart rate up to 150s+ with exertion.  1 episode of NSVT 28 beats this afternoon.  Electrolytes at goal. More detailed record review below but in brief, patient seen by cardiology earlier this month for sinus tachycardia.  At that time conservative management was recommended.  Review of record is notable for tachycardia at every office visit on file through mid December. 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 Cardiology consulted. Will limit activity as it precipitates severe DOE and even arrhythmia. .  Current chronic use of systemic steroids Patient on prednisone 5mg  . Indication not apparent.    Antineoplastic chemotherapy induced pancytopenia    PMH pulmonary embolism (HCC) No evidence of recurrence on CT.  Continue apixaban.   Aortic atherosclerosis   Record review with attention to tachycardia: 2/12 PMT HR 142,  BP 126/81. Oncology note: She continues to experience an elevated heart rate, reaching 140-145 bpm after mild activities such as getting up to use the restroom. Two weeks ago, her heart rate reached the 160s after walking down a hall. No dizziness, lightheadedness, palpitations, or jitteriness accompany these episodes. Patient was seen by Cassie, PA and recently followed up with Cardiology for these concerns. Tachycardia Persistent elevated heart rate, even with mild activity. No associated symptoms such as dizziness, lightheadedness, or palpitations. -Continue monitoring heart rate. -Cassie, PA is aware. Recent visit with Cardiologist.  -Patient aware to hold zofran as directed due to Qtc prolongation.   2/7 Cardiology  Sinus tachycardia HR 130, BP 90/56 Echo is normal. Prior CT in January showed no PE. Likely in the setting of low potassium and magnesium.  Furthermore she may be dehydrated.  Also cancer itself can impact sympathetic activity.  She is also anemic with a hemoglobin of 9, this contributes as well. -Can give IV fluids with therapy -replete K>4, Mg>2 -Can transfuse Hgb>7 Follow up in 3 months with APP: please transition to Theresia Bough   Heart rates over the last 3+ months 2/5 HR 123 1/30 HR 123 1/27 HR 109 1/21 HR 137 1/9 HR 133 12/31 HR 118 11/12 HR 149 10/24 HR 106    Subjective:  Feels poorly Dyspnea w/ any exertion Takes awhile to recover after going to bathroom  Physical Exam: Vitals:   12/29/23 1020 12/29/23 1021 12/29/23 1025 12/29/23 1530  BP: (!) 146/93 (!) 141/92 (!) 127/94 (!) 151/99  Pulse: (!) 119 (!) 127 (!) 127 (!) 127  Resp:    20  Temp:    99 F (37.2 C)  TempSrc:    Oral  SpO2: 97% 96% 97% 95%  Weight:      Height:       Physical Exam Vitals reviewed.  Constitutional:      General: She is not in acute distress.    Appearance: She is ill-appearing. She is not toxic-appearing.  Cardiovascular:     Rate and Rhythm: Regular rhythm.  Tachycardia present.     Heart sounds: No murmur heard.    Comments: Telemetry ST, 1 episode of VT 28 beats Pulmonary:     Effort: Pulmonary effort is normal. No respiratory distress.     Breath sounds: No wheezing, rhonchi or rales.  Neurological:     Mental Status: She is alert.  Psychiatric:        Mood and Affect: Mood normal.        Behavior: Behavior normal.     Data Reviewed: BMP noted Phos and Mg WNL  Family Communication: boyfriend, son  Disposition: Status is: Inpatient Remains inpatient appropriate because: tachy cardiomyopathy, tacycardic     Time spent: 35 minutes  Author: Brendia Sacks, MD 12/29/2023 3:40 PM  For on call review [email or web address].

## 2023-12-29 NOTE — Plan of Care (Signed)
  Problem: Education: Goal: Knowledge of General Education information will improve Description: Including pain rating scale, medication(s)/side effects and non-pharmacologic comfort measures Outcome: Progressing   Problem: Clinical Measurements: Goal: Ability to maintain clinical measurements within normal limits will improve Outcome: Progressing Goal: Respiratory complications will improve Outcome: Progressing   

## 2023-12-29 NOTE — TOC CM/SW Note (Signed)
 Transition of Care [facility]) - Inpatient Brief Assessment   Patient Details  Name: Rebecca Ochoa MRN: [id number] Date of Birth: [birth year]  Transition of Care [facility]) CM/SW Contact:    Larrie Kass, LCSW Phone Number: 12/29/2023, 4:11 PM     Transition of Care Asessment: Insurance and Status: Insurance coverage has been reviewed   Home environment has been reviewed: home with self Prior level of function:: indepednent Prior/Current Home Services: No current home services Social Drivers of Health Review: SDOH reviewed no interventions necessary Readmission risk has been reviewed: Yes Transition of care needs: no transition of care needs at this time

## 2023-12-29 NOTE — Progress Notes (Signed)
 DIAGNOSIS: Stage IVB ( T3, N2, M1c) non-Small Cell Lung Cancer, poorly differentiated squamous cell carcinoma presented with large right upper lobe lung mass in addition to large necrotic right hilar/mediastinal mass and innumerable hypermetabolic pulmonary metastatic lesions in addition to liver and bone metastasis as well as abdominal celiac axis lymphadenopathy diagnosed in October 2024.    PDL1: 0%   Molecular Studies: Positive for EGFR G719A   PRIOR THERAPY:  1) Palliative radiotherapy to the large hilar and mediastinal mass under the care of Dr. Roselind Messier last dose on 09/04/23 2) targeted treatment with Tagrisso 80 mg p.o. daily discontinued in February 2025 due to prolonged QTc.   CURRENT THERAPY: She will start concurrent systemic chemotherapy with carboplatin for AUC of 5 and Alimta 500 Mg/M2 on November 14, 2023. Status post 1 cycle. Starting from cycle #2 dose of carboplatin was reduced to an AUC of 5 and Alimta 400 mg/m.  Due to rash, her premedications are being adjusted.  Dr. Arbutus Ped is also going to start her on Tarceva dose reduced 100 mg p.o. daily for EGFR mutation.  Subjective: The patient is seen and examined today.  Her son was at the bedside.  She is a very pleasant 63 years old white female diagnosed with stage IV non-small cell lung cancer with significant disease involving the right lung in addition to right hilar and mediastinal lymphadenopathy with innumerable hypermetabolic pulmonary metastatic lesion, liver and bone metastasis diagnosed in October 2024 she has positive EGFR mutation G719A.  She received palliative radiotherapy to the right lung mass as well as the right hilar and mediastinal lymphadenopathy then treated with targeted therapy with Tagrisso 80 mg p.o. daily discontinued in February 2025 secondary to QT prolongation and she started treatment with Tarceva 100 mg p.o. daily few days ago.  She also has been on concurrent chemotherapy with reduced dose carboplatin and  Alimta status post 2 cycles.  The patient was admitted to the hospital with worsening fatigue and weakness as well as shortness of breath and tachycardia.  She had persistent tachycardia that has been going on for several weeks now and cardiac workup by her cardiologist revealed sinus tachycardia but she is not currently on any medications.  She continues to complain of fatigue and shortness of breath  Objective: Vital signs in last 24 hours: Temp:  [98.1 F (36.7 C)-99.2 F (37.3 C)] 98.4 F (36.9 C) (02/28 0858) Pulse Rate:  [110-127] 127 (02/28 1025) Resp:  [16-20] 20 (02/28 0858) BP: (127-150)/(82-96) 127/94 (02/28 1025) SpO2:  [95 %-98 %] 97 % (02/28 1025)  Intake/Output from previous day: 02/27 0701 - 02/28 0700 In: 3313.5 [P.O.:840; I.V.:1550.4; IV Piggyback:923] Out: 250 [Urine:250] Intake/Output this shift: No intake/output data recorded.  General appearance: alert, cooperative, fatigued, and no distress Resp: rales bilaterally Cardio: Tachycardic GI: soft, non-tender; bowel sounds normal; no masses,  no organomegaly Extremities: extremities normal, atraumatic, no cyanosis or edema  Lab Results:  Recent Labs    12/27/23 0658 12/28/23 0247  WBC 2.2* 2.1*  HGB 7.7* 9.4*  HCT 24.5* 29.3*  PLT 78* 102*   BMET Recent Labs    12/28/23 1701 12/29/23 0206  NA 135 137  K 3.8 4.2  CL 105 106  CO2 25 24  GLUCOSE 95 82  BUN 7* <5*  CREATININE 0.63 0.67  CALCIUM 8.5* 8.8*    Studies/Results: No results found.  Medications: I have reviewed the patient's current medications.    Assessment/Plan: This is a very pleasant 63 years old white  female with stage IV non-small cell lung cancer, with positive EGFR G719A diagnosed in October 2024 and treated with targeted therapy with Tagrisso before it was discontinued secondary to QT prolongation and she started treatment with Tarceva few days ago but did not take much of this treatment.  She has been admitted to the  hospital with tachycardia, fatigue and shortness of breath.  This is likely multifactorial secondary to her disease in addition to systemic chemotherapy and poor appetite with anemia. She had repeat CT scan of the chest, abdomen and pelvis during this hospitalization that showed improvement in her pulm metastasis as well as no evidence for disease progression in the chest. I strongly recommend for the patient to continue her current treatment with Tarceva 100 mg p.o. daily.  We will continue to monitor her condition closely before resuming her systemic chemotherapy. For the tachycardia, she is followed by cardiology and she may benefit from a beta-blocker but this need to be discussed with her cardiologist. For the anemia, we will continue to keep her hemoglobin above 8.0 with transfusion if needed. Thank you for taking good care of Ms. Talamante.  Please call if you have any questions.   LOS: 2 days    Lajuana Matte 12/29/2023

## 2023-12-29 NOTE — Progress Notes (Signed)
  Echocardiogram 2D Echocardiogram has been performed.  Rebecca Ochoa 12/29/2023, 3:18 PM

## 2023-12-30 DIAGNOSIS — C3411 Malignant neoplasm of upper lobe, right bronchus or lung: Secondary | ICD-10-CM | POA: Diagnosis not present

## 2023-12-30 DIAGNOSIS — I5020 Unspecified systolic (congestive) heart failure: Secondary | ICD-10-CM | POA: Diagnosis not present

## 2023-12-30 DIAGNOSIS — I428 Other cardiomyopathies: Secondary | ICD-10-CM | POA: Diagnosis not present

## 2023-12-30 DIAGNOSIS — I951 Orthostatic hypotension: Secondary | ICD-10-CM | POA: Diagnosis not present

## 2023-12-30 DIAGNOSIS — D649 Anemia, unspecified: Secondary | ICD-10-CM | POA: Diagnosis not present

## 2023-12-30 DIAGNOSIS — R000 Tachycardia, unspecified: Secondary | ICD-10-CM | POA: Diagnosis not present

## 2023-12-30 MED ORDER — METOPROLOL TARTRATE 50 MG PO TABS
50.0000 mg | ORAL_TABLET | Freq: Four times a day (QID) | ORAL | Status: AC
  Administered 2023-12-30 – 2024-01-01 (×10): 50 mg via ORAL
  Filled 2023-12-30 (×10): qty 1

## 2023-12-30 MED ORDER — ENSURE ENLIVE PO LIQD
237.0000 mL | Freq: Two times a day (BID) | ORAL | Status: DC
  Administered 2023-12-31 – 2024-01-01 (×2): 237 mL via ORAL

## 2023-12-30 MED ORDER — EMPAGLIFLOZIN 10 MG PO TABS
10.0000 mg | ORAL_TABLET | Freq: Every day | ORAL | Status: DC
  Administered 2023-12-30 – 2024-01-02 (×4): 10 mg via ORAL
  Filled 2023-12-30 (×4): qty 1

## 2023-12-30 MED ORDER — LACTATED RINGERS IV BOLUS: 1000.0000 mL | Freq: Once | INTRAVENOUS | Status: DC

## 2023-12-30 NOTE — Progress Notes (Signed)
  Progress Note   Patient: Rebecca Ochoa EAV:[id number] DOB: 03-29-1961 DOA: 12/26/2023     3 DOS: the patient was seen and examined on 12/30/2023   Brief hospital course: 63 year old woman PMH metastatic lung cancer currently undergoing chemotherapy followed by Dr. Shirline Frees, went to the cancer center 2/24, reported weakness and worsening shortness of breath, presyncopal, noted to be tachycardic and sent to the emergency department.  Treated for symptomatic anemia and dehydration with aggressive IV fluids and PRBCs 2 units without effect.  Further review of chart showed persistent tachycardia since mid December at least on multiple visits.  Echocardiogram revealed systolic dysfunction, cardiology consulted, started on beta-blocker.  Consultants Cardiology  Procedures/Events   Assessment and Plan: Symptomatic anemia secondary to chemotherapy, anemia of chronic disease Metastatic lung cancer Workup reassuring included negative CT PE, negative influenza, RSV and COVID.  Troponins negative. No improvement with 2 units PRBC and aggressive IV fluids. Tachycardic with heart rate in the 120s while resting.  Remains sinus tachycardia.  No signs or symptoms of infection.  She is not on any medications for rate control normally.  Stress-induced systolic CHF Per cardiology today thought to be possible stress cardiomyopathy Ischemic etiology not suspected Continue metoprolol Started on Jardiance  Marked sinus tachycardia since mid-December 2024 Heart rate up to 150s+ with exertion.  1 episode of NSVT 28 beats 2/28.  Started on beta-blocker with mild improvement thus far.  Orthostatic hypotension She is already received aggressive IV fluids and PRBCs I suspect this is autonomic in nature Start TED hose and abdominal binder.  Will give 1 more liter of IV fluids. .  Current chronic use of systemic steroids Patient on prednisone 5mg  . Indication not apparent.    Antineoplastic chemotherapy induced  pancytopenia    PMH pulmonary embolism (HCC) No evidence of recurrence on CT.  Continue apixaban.   Aortic atherosclerosis      Subjective:  Feels about the same.  Does not feel tachycardia. Although objectively orthostatic earlier today she was asymptomatic.  Physical Exam: Vitals:   12/29/23 2030 12/29/23 2125 12/30/23 0522 12/30/23 0800  BP: (!) 138/93 (!) 138/93 131/86   Pulse: (!) 103 (!) 103 (!) 110   Resp: 20  20   Temp: 98 F (36.7 C)  98.5 F (36.9 C)   TempSrc: Oral  Oral   SpO2: 95%  93% 100%  Weight:      Height:       Physical Exam Vitals reviewed.  Constitutional:      General: She is not in acute distress.    Appearance: She is not ill-appearing or toxic-appearing.  Cardiovascular:     Rate and Rhythm: Regular rhythm. Tachycardia present.     Heart sounds: No murmur heard.    Comments: Telemetry shows sinus tachycardia around 120.  No further NSVT. Pulmonary:     Effort: Pulmonary effort is normal. No respiratory distress.     Breath sounds: No wheezing, rhonchi or rales.  Neurological:     Mental Status: She is alert.  Psychiatric:        Behavior: Behavior normal.     Data Reviewed: No new data  Family Communication: Boyfriend at bedside  Disposition: Status is: Inpatient Remains inpatient appropriate because: Tachycardia     Time spent: 25 minutes  Author: Brendia Sacks, MD 12/30/2023 12:25 PM  For on call review [email or web address].

## 2023-12-30 NOTE — Consult Note (Signed)
 Cardiology Consultation   Patient ID: Rebecca Ochoa MRN: [id number]; DOB: April 25, 1961  Admit date: 12/26/2023 Date of Consult: 12/30/2023  PCP:  Patient, No Pcp Per   [facility] Providers Cardiologist:  [name], MD        Patient Profile:   Rebecca Ochoa is a 63 y.o. female with a hx of 63 y.o. female with medical history significant of medical issues as listed below including metastatic lung cancer (has poorly differentiated squamous cell carcinoma, right large lung mass in addition to a large necrotic right hilar/mediastinal mass along with multiple pulmonary metastatic lesions, with liver and bone mets), PE on eliquis who is being seen 12/30/2023 for the evaluation of new onset low EF at the request of Dr. Irene Limbo.  History of Present Illness:   Rebecca Ochoa initially presented here on 2/25 with a few days of acute on chronic shortness of breath, as well as some chest discomfort with deep breathing/pleuritic in nature.  She was afebrile.  She was in the cancer center that day and heart rate was up to the 150s, she felt very weak.  She also has chronic diarrhea.  She was given fluids for orthostatic hypotension.  She was also acutely anemic status post PRBCs.  She was noted to have some symptoms despite this therapy.  An echo was ordered and shows what looks like global hypokinesis, the windows are not very good.  Her EF is globally moderately reduced.  She is notably on chronic steroids.  For her cancer she is getting palliative radiotherapy to the large hilar and mediastinal mass.  She also was started on Tagrisso however this was stopped in the setting of prolonged QTc.  She also was on cytotoxic chemotherapy in January.  She was started on Tarceva 2 days ago however she has not been able to be treated much due to her admission. I saw her in the clinic and she was having significant sinus tachycardia and was anemic.  At that time I recommended fluids and transfusion as needed.   Her blood pressure was too low for beta-blocker therapy.   Past Medical History:  Diagnosis Date   Anemia    Asthma, mild intermittent, well-controlled    GERD (gastroesophageal reflux disease)    Hx of migraines    Hyperlipidemia    Hypertension    Vitamin D deficiency     Past Surgical History:  Procedure Laterality Date   BREAST SURGERY     reduction   BRONCHIAL BIOPSY  08/08/2023   Procedure: BRONCHIAL BIOPSIES;  Surgeon: Leslye Peer, MD;  Location: [facility] ENDOSCOPY;  Service: Pulmonary;;   BRONCHIAL BRUSHINGS  08/08/2023   Procedure: BRONCHIAL BRUSHINGS;  Surgeon: Leslye Peer, MD;  Location[facility] ENDOSCOPY;  Service: Pulmonary;;   BRONCHIAL NEEDLE ASPIRATION BIOPSY  08/08/2023   Procedure: BRONCHIAL NEEDLE ASPIRATION BIOPSIES;  Surgeon: Leslye Peer, MD;  Location: [facility] ENDOSCOPY;  Service: Pulmonary;;   ENDOBRONCHIAL ULTRASOUND Bilateral 08/08/2023   Procedure: ENDOBRONCHIAL ULTRASOUND;  Surgeon: Leslye Peer, MD;  Location: [facility] ENDOSCOPY;  Service: Pulmonary;  Laterality: Bilateral;   HEMOSTASIS CONTROL  08/08/2023   Procedure: HEMOSTASIS CONTROL;  Surgeon: Leslye Peer, MD;  [facility] ENDOSCOPY;  Service: Pulmonary;;   IR IMAGING GUIDED PORT INSERTION  09/01/2023   REDUCTION MAMMAPLASTY     TUBAL LIGATION       Home Medications:  Prior to Admission medications   Medication Sig Start Date End Date Taking? Authorizing Provider  acetaminophen (TYLENOL) 500  MG tablet Take 1,000 mg by mouth every 8 (eight) hours as needed for mild pain.   Yes [provider]  ALPRAZolam (XANAX) 0.5 MG tablet Take 1 tablet (0.5 mg total) by mouth 3 (three) times daily as needed for anxiety. Patient taking differently: Take 0.5 mg by mouth in the morning. 12/13/23  Yes Pickenpack-Cousar, Arty Baumgartner, NP  apixaban (ELIQUIS) 5 MG TABS tablet Take 1 tablet (5 mg total) by mouth 2 (two) times daily. 09/25/23  Yes Jonah Blue, MD  cyanocobalamin (VITAMIN B12) 500 MCG tablet Take 1  tablet (500 mcg total) by mouth daily. 10/23/23 01/31/24 Yes Pokhrel, Rebekah Chesterfield, MD  dexamethasone (DECADRON) 4 MG tablet Take 1 tablet twice a day the day before, the day of, and the day after chemotherapy 12/13/23  Yes Heilingoetter, Cassandra L, PA-C  erlotinib (TARCEVA) 100 MG tablet Take 1 tablet (100 mg total) by mouth daily. Take on an empty stomach 1 hour before meals or 2 hours after 12/14/23  Yes Si Gaul, MD  famotidine (PEPCID) 20 MG tablet Take 1 tablet (20 mg total) by mouth at bedtime. 12/13/23  Yes Pickenpack-Cousar, Arty Baumgartner, NP  folic acid (FOLVITE) 1 MG tablet TAKE 1 TABLET BY MOUTH EVERY DAY 12/22/23  Yes Heilingoetter, Cassandra L, PA-C  HYDROcodone-acetaminophen (NORCO/VICODIN) 5-325 MG tablet Take 1 tablet by mouth every 6 (six) hours as needed. Patient taking differently: Take 1 tablet by mouth every 6 (six) hours as needed (for pain). 09/07/23  Yes Rhetta Mura, MD  loperamide (IMODIUM) 2 MG capsule Take 1 capsule (2 mg total) by mouth as needed for diarrhea or loose stools. 10/23/23  Yes Pokhrel, Laxman, MD  nystatin (MYCOSTATIN/NYSTOP) powder Apply 1 Application topically 3 (three) times daily. Apply to under breast for rash if needed. 11/27/23  Yes Walisiewicz, Kaitlyn E, PA-C  potassium chloride SA (KLOR-CON M) 20 MEQ tablet Take 1 tablet (20 mEq total) by mouth daily. 12/19/23  Yes Heilingoetter, Cassandra L, PA-C  predniSONE (DELTASONE) 10 MG tablet Take 0.5 tablets (5 mg total) by mouth daily with breakfast. 11/21/23  Yes Si Gaul, MD  prochlorperazine (COMPAZINE) 10 MG tablet Take 1 tablet (10 mg total) by mouth every 6 (six) hours as needed for nausea or vomiting. 12/15/23  Yes Pickenpack-Cousar, Arty Baumgartner, NP  Simethicone (GAS-X PO) Take 2 tablets by mouth daily as needed.   Yes [provider]  temazepam (RESTORIL) 30 MG capsule Take 1 capsule (30 mg total) by mouth at bedtime as needed for sleep. Patient taking differently: Take 30 mg by mouth at  bedtime. 12/13/23  Yes Pickenpack-Cousar, Arty Baumgartner, NP  bisoprolol-hydrochlorothiazide [facility]) 5-6.25 MG tablet Take  1 tablet  Daily  for BP 06/06/22 07/11/22  [name], MD    Inpatient Medications: Scheduled Meds:  apixaban  5 mg Oral BID   Chlorhexidine Gluconate Cloth  6 each Topical Daily   famotidine  20 mg Oral QHS   folic acid  1 mg Oral Daily   metoprolol tartrate  50 mg Oral QID   predniSONE  5 mg Oral Q breakfast   sodium chloride flush  10-40 mL Intracatheter Q12H   sodium chloride flush  3 mL Intravenous Q12H   temazepam  30 mg Oral QHS   cyanocobalamin  500 mcg Oral Daily   Continuous Infusions:  PRN Meds: acetaminophen **OR** acetaminophen, ALPRAZolam, lip balm, loperamide, polyethylene glycol, prochlorperazine, sodium chloride flush  Allergies:    Allergies  Allergen Reactions   Biaxin [Clarithromycin] Nausea Only  Social History:   Social History   Socioeconomic History   Marital status: Media planner    Spouse name: Not on file   Number of children: Not on file   Years of education: Not on file   Highest education level: Not on file  Occupational History   Not on file  Tobacco Use   Smoking status: Former    Types: E-cigarettes   Smokeless tobacco: Never   Tobacco comments:    Quit in October 2024.  11/09/2023 hfb  Vaping Use   Vaping status: Every Day  Substance and Sexual Activity   Alcohol use: Yes    Alcohol/week: 3.0 standard drinks of alcohol    Types: 3 Standard drinks or equivalent per week   Drug use: Never   Sexual activity: Not on file  Other Topics Concern   Not on file  Social History Narrative   Not on file   Social Drivers of Health   Financial Resource Strain: Not on file  Food Insecurity: No Food Insecurity (12/27/2023)   Hunger Vital Sign    Worried About Running Out of Food in the Last Year: Never true    Ran Out of Food in the Last Year: Never true  Transportation Needs: No Transportation Needs (12/27/2023)    PRAPARE - Administrator, Civil Service (Medical): No    Lack of Transportation (Non-Medical): No  Recent Concern: Transportation Needs - Unmet Transportation Needs (10/17/2023)   PRAPARE - Administrator, Civil Service (Medical): Yes    Lack of Transportation (Non-Medical): No  Physical Activity: Not on file  Stress: Not on file  Social Connections: Not on file  Intimate Partner Violence: Not At Risk (12/27/2023)   Humiliation, Afraid, Rape, and Kick questionnaire    Fear of Current or Ex-Partner: No    Emotionally Abused: No    Physically Abused: No    Sexually Abused: No    Family History:    Family History  Problem Relation Age of Onset   Heart disease Father    Hypertension Father    Diabetes Father    Kidney disease Father    Cancer Brother      ROS:  Please see the history of present illness.   All other ROS reviewed and negative.     Physical Exam/Data:   Vitals:   12/29/23 1530 12/29/23 2030 12/29/23 2125 12/30/23 0522  BP: (!) 151/99 (!) 138/93 (!) 138/93 131/86  Pulse: (!) 127 (!) 103 (!) 103 (!) 110  Resp: 20 20  20   Temp: 99 F (37.2 C) 98 F (36.7 C)  98.5 F (36.9 C)  TempSrc: Oral Oral  Oral  SpO2: 95% 95%  93%  Weight:      Height:        Intake/Output Summary (Last 24 hours) at 12/30/2023 0823 Last data filed at 12/29/2023 2200 Gross per 24 hour  Intake 1168.84 ml  Output --  Net 1168.84 ml      12/26/2023    8:56 AM 12/13/2023    9:14 AM 12/08/2023    7:56 AM  Last 3 Weights  Weight (lbs) 129 lb 137 lb 137 lb 6.4 oz  Weight (kg) 58.514 kg 62.143 kg 62.324 kg     Body mass index is 21.47 kg/m.  General:  Well nourished, well developed, in no acute distress HEENT: normal Neck: no JVD Vascular: No carotid bruits; Distal pulses 2+ bilaterally Cardiac:  normal S1, S2; tachycardic; no murmur  Lungs:  clear to auscultation bilaterally, no wheezing, rhonchi or rales  Abd: soft, nontender, no hepatomegaly  Ext: no  edema Musculoskeletal:  No deformities, BUE and BLE strength normal and equal Skin: warm and dry  Neuro:  CNs 2-12 intact, no focal abnormalities noted Psych:  Normal affect   EKG:  The EKG was personally reviewed and demonstrates: No new  Telemetry:  Telemetry was personally reviewed and demonstrates: Sinus tachycardia, mostly 120s  Relevant CV Studies: TTE 12/29/2023  1. Left ventricular ejection fraction, by estimation, is 30 to 35%. The  left ventricle has severely decreased function. The left ventricle  demonstrates regional wall motion abnormalities with akinesis of the basal  to mid anteroseptal, inferoseptal, and  inferior walls. Akinesis of the basal anterior wall. The function of  apical segments is relatively well-preserved. This picture of wall motion  abnormalities would be unusual with coronary disease and could be  consistent with a "reverse Takotsubo" stress  cardiomyopathy.   2. Right ventricular systolic function is normal. The right ventricular  size is normal.   3. The inferior vena cava is normal in size with greater than 50%  respiratory variability, suggesting right atrial pressure of 3 mmHg.   4. Limited echo.   Laboratory Data:  High Sensitivity Troponin:   Recent Labs  Lab 12/26/23 0936 12/26/23 1328  TROPONINIHS 3 3     Chemistry Recent Labs  Lab 12/28/23 0247 12/28/23 1701 12/29/23 0206  NA 136 135 137  K 3.0* 3.8 4.2  CL 102 105 106  CO2 25 25 24   GLUCOSE 82 95 82  BUN 8 7* <5*  CREATININE 0.78 0.63 0.67  CALCIUM 8.5* 8.5* 8.8*  MG 1.6* 1.9 2.2  GFRNONAA >60 >60 >60  ANIONGAP 9 5 7     Recent Labs  Lab 12/26/23 0755 12/26/23 0936  PROT 6.9 6.6  ALBUMIN 3.6 3.0*  AST 22 25  ALT 18 21  ALKPHOS 108 103  BILITOT 0.9 1.2   Lipids No results for input(s): "CHOL", "TRIG", "HDL", "LABVLDL", "LDLCALC", "CHOLHDL" in the last 168 hours.  Hematology Recent Labs  Lab 12/26/23 0936 12/27/23 0658 12/28/23 0247  WBC 2.9* 2.2* 2.1*   RBC 2.93* 2.88* 3.47*  HGB 8.0* 7.7* 9.4*  HCT 25.2* 24.5* 29.3*  MCV 86.0 85.1 84.4  MCH 27.3 26.7 27.1  MCHC 31.7 31.4 32.1  RDW 16.9* 18.2* 17.2*  PLT 70* 78* 102*   Thyroid  Recent Labs  Lab 12/26/23 1916  TSH 1.211    BNP Recent Labs  Lab 12/26/23 0658  BNP 73.3    DDimer No results for input(s): "DDIMER" in the last 168 hours.   Radiology/Studies:  ECHOCARDIOGRAM LIMITED Result Date: 12/29/2023    ECHOCARDIOGRAM LIMITED REPORT   Patient Name:   MYRL BYNUM Date of Exam: 12/29/2023 Medical Rec #:  [id number]    Height:       65.0 in Accession #:    [id number]   Weight:       129.0 lb Date of Birth:  08-14-1961     BSA:          1.642 m Patient Age:    63 years     BP:           127/94 mmHg Patient Gender: F            HR:           124 bpm. Exam Location:  Inpatient Procedure: Limited Echo, Cardiac Doppler, Color Doppler and  Intracardiac            Opacification Agent (Both Spectral and Color Flow Doppler were            utilized during procedure). REPORT CONTAINS CRITICAL RESULT Indications:    R00.0 Tachycardia  History:        Patient has prior history of Echocardiogram examinations, most                 recent 09/05/2023. Abnormal ECG, Arrythmias:Tachycardia,                 Signs/Symptoms:Dyspnea and Shortness of Breath; Risk                 Factors:Current Smoker and Dyslipidemia. Pulmonary embolus. Lung                 cancer. Chemo. ICH.  Sonographer:    Sheralyn Boatman RDCS Referring Phys: [phone] DANIEL P GOODRICH  Sonographer Comments: Technically difficult study due to poor echo windows. Port in parasternal. IMPRESSIONS  1. Left ventricular ejection fraction, by estimation, is 30 to 35%. The left ventricle has severely decreased function. The left ventricle demonstrates regional wall motion abnormalities with akinesis of the basal to mid anteroseptal, inferoseptal, and inferior walls. Akinesis of the basal anterior wall. The function of apical segments is relatively well-preserved.  This picture of wall motion abnormalities would be unusual with coronary disease and could be consistent with a "reverse Takotsubo" stress cardiomyopathy.  2. Right ventricular systolic function is normal. The right ventricular size is normal.  3. The inferior vena cava is normal in size with greater than 50% respiratory variability, suggesting right atrial pressure of 3 mmHg.  4. Limited echo. FINDINGS  Left Ventricle: Left ventricular ejection fraction, by estimation, is 30 to 35%. The left ventricle has moderately decreased function. The left ventricle demonstrates regional wall motion abnormalities. Definity contrast agent was given IV to delineate the left ventricular endocardial borders. The left ventricular internal cavity size was normal in size. There is no left ventricular hypertrophy. Right Ventricle: The right ventricular size is normal. No increase in right ventricular wall thickness. Right ventricular systolic function is normal. Pericardium: There is no evidence of pericardial effusion. Venous: The inferior vena cava is normal in size with greater than 50% respiratory variability, suggesting right atrial pressure of 3 mmHg. Additional Comments: Spectral Doppler performed. Color Doppler performed.  LEFT VENTRICLE PLAX 2D LVIDd:         4.60 cm LVIDs:         4.00 cm LV PW:         1.00 cm LV IVS:        1.10 cm  LV Volumes (MOD) LV vol d, MOD A2C: 70.8 ml LV vol d, MOD A4C: 107.0 ml LV vol s, MOD A2C: 61.4 ml LV vol s, MOD A4C: 62.4 ml LV SV MOD A2C:     9.4 ml LV SV MOD A4C:     107.0 ml LV SV MOD BP:      35.4 ml IVC IVC diam: 1.30 cm Dalton McleanMD Electronically signed by [name] Signature Date/Time: 12/29/2023/3:26:14 PM    Final    CT ABDOMEN PELVIS WO CONTRAST Result Date: 12/26/2023 CLINICAL DATA:  History of non-small cell lung cancer, abdominal pain, anemia EXAM: CT ABDOMEN AND PELVIS WITHOUT CONTRAST TECHNIQUE: Multidetector CT imaging of the abdomen and pelvis was performed  following the standard protocol without IV contrast. RADIATION DOSE REDUCTION: This exam was performed according  to the departmental dose-optimization program which includes automated exposure control, adjustment of the mA and/or kV according to patient size and/or use of iterative reconstruction technique. COMPARISON:  11/09/2023, 12/26/2023 FINDINGS: Lower chest: Multiple pulmonary nodules and right hilar mass are seen consistent with known metastatic lung cancer. Please see CT chest performed earlier today. Hepatobiliary: Numerous hypodense masses are seen throughout the liver, demonstrating decreased size and central necrosis since prior study. Index lesion in the left lobe liver measures 6.0 x 3.5 cm reference image 10/2, previously 8.3 x 4.5 cm. No evidence of cholelithiasis or cholecystitis. No evidence of biliary duct dilation. Pancreas: Unremarkable. No pancreatic ductal dilatation or surrounding inflammatory changes. Spleen: Normal in size without focal abnormality. The hypodense lesions seen on prior study, and visualized on earlier CT chest performed today, are not well visualized on this exam likely due to phase of contrast enhancement. Adrenals/Urinary Tract: Numerous indeterminate bilateral renal hypodensities are again noted. Index lesion in the interpolar right kidney reference image 17/2 measures 1.7 cm, previously measuring 1.8 cm. No hydronephrosis or nephrolithiasis. The adrenals are unremarkable. Excreted contrast within the urinary bladder with no filling defect identified. Stomach/Bowel: No bowel obstruction or ileus. Normal appendix right lower quadrant. Diverticulosis of the sigmoid colon without evidence of acute diverticulitis. Vascular/Lymphatic: Stable aortic atherosclerosis. There is persistent adenopathy at the porta hepatis, with decreased areas of necrosis and cavitation since prior study. Index lymph node on image 16/2 measures 2.4 cm in short axis, unchanged by my measurements  since prior. No new adenopathy identified. Reproductive: Uterus and bilateral adnexa are unremarkable. Other: No free fluid or free intraperitoneal gas. No abdominal wall hernia. Musculoskeletal: No acute or destructive bony abnormalities. Reconstructed images demonstrate no additional findings. IMPRESSION: 1. Right hilar mass and numerous bilateral pulmonary nodules consistent with known history of metastatic lung cancer. Please see chest CT performed earlier today. 2. Numerous hypodense masses within the liver, demonstrating decrease in size and resolution of central necrosis/cavitation on prior study. 3. Continued portacaval adenopathy, without significant change in size of lymph nodes. The central cavitation/necrosis has resolved. 4. Numerous bilateral renal hypodensities, too small to characterize, but grossly stable since prior exam. Metastatic disease remains the diagnosis of exclusion. 5. The splenic hypodensity seen on previous study and visualized on earlier chest CT today are not well visualized on this exam, likely due to delayed imaging relative to contrast administration performed earlier for chest CT. 6. Sigmoid diverticulosis without diverticulitis. 7. Aortic Atherosclerosis (ICD10-I70.0). Electronically Signed   By: Sharlet Salina M.D.   On: 12/26/2023 18:40   CT Angio Chest PE W and/or Wo Contrast Result Date: 12/26/2023 CLINICAL DATA:  Concern for pulmonary embolism. History of non-small cell lung cancer. EXAM: CT ANGIOGRAPHY CHEST WITH CONTRAST TECHNIQUE: Multidetector CT imaging of the chest was performed using the standard protocol during bolus administration of intravenous contrast. Multiplanar CT image reconstructions and MIPs were obtained to evaluate the vascular anatomy. RADIATION DOSE REDUCTION: This exam was performed according to the departmental dose-optimization program which includes automated exposure control, adjustment of the mA and/or kV according to patient size and/or use of  iterative reconstruction technique. CONTRAST:  75mL OMNIPAQUE IOHEXOL 350 MG/ML SOLN COMPARISON:  CT dated 11/09/2023. FINDINGS: Cardiovascular: There is no cardiomegaly or pericardial effusion. Mild atherosclerotic calcification of the thoracic aorta. No aneurysmal dilatation or dissection. The origins of the great vessels of the aortic arch appear patent. Left-sided Port-A-Cath with tip at the cavoatrial junction. No pulmonary artery embolus identified. Mediastinum/Nodes: Right hilar mass encasing  the right pulmonary artery and right upper and middle lobe bronchi relatively similar to prior CT. The esophagus is grossly unremarkable. No mediastinal fluid collection. Lungs/Pleura: Multiple bilateral pulmonary nodules similar or slightly decreased since the prior CT consistent with metastatic disease. No consolidative changes. Trace right pleural effusion. No pneumothorax. The central airways are patent. Upper Abdomen: Retroperitoneal adenopathy and left liver masses. Musculoskeletal: Osteopenia with degenerative changes. No acute osseous pathology. Review of the MIP images confirms the above findings. IMPRESSION: 1. No CT evidence of pulmonary artery embolus. 2. Right hilar mass and pulmonary metastasis similar or slightly decreased in size since the prior CT. 3. Retroperitoneal adenopathy and left liver masses. 4.  Aortic Atherosclerosis (ICD10-I70.0). Electronically Signed   By: Elgie Collard M.D.   On: 12/26/2023 14:18   DG Chest 1 View Result Date: 12/26/2023 CLINICAL DATA:  Chest pain, cough, and weakness. EXAM: CHEST  1 VIEW COMPARISON:  11/09/2023. FINDINGS: The heart size and mediastinal contours are within normal limits. There is atherosclerotic calcification of the aorta scattered opacities with right upper lobe and right hilar mass are noted in the lungs bilaterally, compatible with known pulmonary nodules and masses. No effusion or pneumothorax is seen. A left chest port is stable in position. No  acute osseous abnormality. IMPRESSION: Scattered opacities in the lungs bilaterally with right upper lobe and right hilar masslike consolidation, increased from the prior exam. Electronically Signed   By: Thornell Sartorius M.D.   On: 12/26/2023 10:46     Assessment and Plan:   Stress induced systolic heart failure Can be a stress cardiomyopathy.  She has no angina. She had pleuritic type chest pain.  She has no ischemic changes on her ECG.  She is euvolemic on exam.  Her BNP is negative.  -Do not suspect an ischemic cause.  Can consider a nuclear stress as an outpatient. - will uptitrate GDMT that she can tolerate with her blood pressure -Can continue metoprolol tartrate 50 q 4h   - can see her how BP does and consider losartan 25 mg daily -Will start Jardiance 10 mg daily -Will see how her blood pressure tolerates and then consider MRA/ARNI   Orthostatic Hypotension Possible autonomic dysfunction component Status post aggressive IVF Can consider TED hose and abdominal binder  Cardio-Onc/Erlotinib TKI/VEGFi are associated with HTN and CHF. Takotsubo is a risk with therapy and cancer itself. Qtc normalized 420. Therapy is held.  Sinus tach In the setting of  orthostatic hypotension, pancytopenia (hgb improved, still anemic), A necrotic lung mass   Hx of PE On Eliquis   Risk Assessment/Risk Scores:       For questions or updates, please contact [facility] Please consult [email or web address] for contact info under    Signed, [name], MD  12/30/2023 8:23 AM

## 2023-12-30 NOTE — Plan of Care (Signed)

## 2023-12-31 DIAGNOSIS — R000 Tachycardia, unspecified: Secondary | ICD-10-CM | POA: Diagnosis not present

## 2023-12-31 DIAGNOSIS — C3411 Malignant neoplasm of upper lobe, right bronchus or lung: Secondary | ICD-10-CM | POA: Diagnosis not present

## 2023-12-31 DIAGNOSIS — I428 Other cardiomyopathies: Secondary | ICD-10-CM | POA: Diagnosis not present

## 2023-12-31 DIAGNOSIS — I951 Orthostatic hypotension: Secondary | ICD-10-CM | POA: Diagnosis not present

## 2023-12-31 DIAGNOSIS — D649 Anemia, unspecified: Secondary | ICD-10-CM | POA: Diagnosis not present

## 2023-12-31 DIAGNOSIS — I5020 Unspecified systolic (congestive) heart failure: Secondary | ICD-10-CM | POA: Diagnosis not present

## 2023-12-31 LAB — CBC
HCT: 32 % — ABNORMAL LOW (ref 36.0–46.0)
Hemoglobin: 10.1 g/dL — ABNORMAL LOW (ref 12.0–15.0)
MCH: 27.4 pg (ref 26.0–34.0)
MCHC: 31.6 g/dL (ref 30.0–36.0)
MCV: 87 fL (ref 80.0–100.0)
Platelets: 192 10*3/uL (ref 150–400)
RBC: 3.68 MIL/uL — ABNORMAL LOW (ref 3.87–5.11)
RDW: 17.6 % — ABNORMAL HIGH (ref 11.5–15.5)
WBC: 2.6 10*3/uL — ABNORMAL LOW (ref 4.0–10.5)
nRBC: 0 % (ref 0.0–0.2)

## 2023-12-31 LAB — CULTURE, BLOOD (ROUTINE X 2)
Culture: NO GROWTH
Special Requests: ADEQUATE

## 2023-12-31 MED ORDER — MIRTAZAPINE 15 MG PO TBDP
15.0000 mg | ORAL_TABLET | Freq: Every day | ORAL | Status: DC
  Administered 2023-12-31 – 2024-01-01 (×2): 15 mg via ORAL
  Filled 2023-12-31 (×2): qty 1

## 2023-12-31 NOTE — Progress Notes (Deleted)
hc

## 2023-12-31 NOTE — Progress Notes (Signed)
 Rounding Note    Patient Name: Rebecca Ochoa Date of Encounter: 12/31/2023  [facility] Cardiologist: [name], MD   Subjective   She feels about the same today.    Interval hx Heart rates have shown improvement Hemoglobin has improved from 9->10 Status post aggressive IV fluids   Inpatient Medications    Scheduled Meds:  apixaban  5 mg Oral BID   Chlorhexidine Gluconate Cloth  6 each Topical Daily   empagliflozin  10 mg Oral Daily   famotidine  20 mg Oral QHS   feeding supplement  237 mL Oral BID BM   folic acid  1 mg Oral Daily   metoprolol tartrate  50 mg Oral QID   predniSONE  5 mg Oral Q breakfast   sodium chloride flush  10-40 mL Intracatheter Q12H   sodium chloride flush  3 mL Intravenous Q12H   temazepam  30 mg Oral QHS   cyanocobalamin  500 mcg Oral Daily   Continuous Infusions:  lactated ringers Stopped (12/30/23 1337)   PRN Meds: acetaminophen **OR** acetaminophen, ALPRAZolam, lip balm, loperamide, polyethylene glycol, prochlorperazine, sodium chloride flush   Vital Signs    Vitals:   12/30/23 2107 12/31/23 0314 12/31/23 0948 12/31/23 1000  BP: 132/84 122/75 128/72   Pulse: 93 92 90   Resp: 18 18 (!) 31 20  Temp: 98.6 F (37 C) 98.5 F (36.9 C) 98.3 F (36.8 C)   TempSrc: Oral Oral Oral   SpO2: 96% 94% 94%   Weight:      Height:        Intake/Output Summary (Last 24 hours) at 12/31/2023 1034 Last data filed at 12/31/2023 0842 Gross per 24 hour  Intake 360 ml  Output 450 ml  Net -90 ml      12/26/2023    8:56 AM 12/13/2023    9:14 AM 12/08/2023    7:56 AM  Last 3 Weights  Weight (lbs) 129 lb 137 lb 137 lb 6.4 oz  Weight (kg) 58.514 kg 62.143 kg 62.324 kg      Telemetry    Sinus rhythm mild sinus tachycardia- Personally Reviewed  ECG    No new - Personally Reviewed  Physical Exam   Vitals:   12/31/23 0948 12/31/23 1000  BP: 128/72   Pulse: 90   Resp: (!) 31 20  Temp: 98.3 F (36.8 C)   SpO2: 94%     GEN:  No acute distress.   Neck: No JVD Cardiac: RRR, no murmurs, rubs, or gallops.  Respiratory: Clear to auscultation bilaterally. GI: Soft, nontender, non-distended  MS: No edema; No deformity. Neuro:  Nonfocal  Psych: Normal affect   Labs    High Sensitivity Troponin:   Recent Labs  Lab 12/26/23 0936 12/26/23 1328  TROPONINIHS 3 3     Chemistry Recent Labs  Lab 12/26/23 0755 12/26/23 0936 12/26/23 1916 12/28/23 0247 12/28/23 1701 12/29/23 0206  NA 138 136   < > 136 135 137  K 4.0 3.7   < > 3.0* 3.8 4.2  CL 101 100   < > 102 105 106  CO2 28 25   < > 25 25 24   GLUCOSE 95 99   < > 82 95 82  BUN 7* 9   < > 8 7* <5*  CREATININE 0.95 0.93   < > 0.78 0.63 0.67  CALCIUM 9.4 8.9   < > 8.5* 8.5* 8.8*  MG  --   --    < >  1.6* 1.9 2.2  PROT 6.9 6.6  --   --   --   --   ALBUMIN 3.6 3.0*  --   --   --   --   AST 22 25  --   --   --   --   ALT 18 21  --   --   --   --   ALKPHOS 108 103  --   --   --   --   BILITOT 0.9 1.2  --   --   --   --   GFRNONAA >60 >60   < > >60 >60 >60  ANIONGAP 9 11   < > 9 5 7    < > = values in this interval not displayed.    Lipids No results for input(s): "CHOL", "TRIG", "HDL", "LABVLDL", "LDLCALC", "CHOLHDL" in the last 168 hours.  Hematology Recent Labs  Lab 12/27/23 0658 12/28/23 0247 12/31/23 0442  WBC 2.2* 2.1* 2.6*  RBC 2.88* 3.47* 3.68*  HGB 7.7* 9.4* 10.1*  HCT 24.5* 29.3* 32.0*  MCV 85.1 84.4 87.0  MCH 26.7 27.1 27.4  MCHC 31.4 32.1 31.6  RDW 18.2* 17.2* 17.6*  PLT 78* 102* 192   Thyroid  Recent Labs  Lab 12/26/23 1916  TSH 1.211    BNP Recent Labs  Lab 12/26/23 0658  BNP 73.3    DDimer No results for input(s): "DDIMER" in the last 168 hours.   Radiology    ECHOCARDIOGRAM LIMITED Result Date: 12/29/2023    ECHOCARDIOGRAM LIMITED REPORT   Patient Name:   Rebecca Ochoa Date of Exam: 12/29/2023 Medical Rec #:  [id number]    Height:       65.0 in Accession #:    [id number]   Weight:       129.0 lb Date of Birth:  Apr 06, 1961      BSA:          1.642 m Patient Age:    63 years     BP:           127/94 mmHg Patient Gender: F            HR:           124 bpm. Exam Location:  Inpatient Procedure: Limited Echo, Cardiac Doppler, Color Doppler and Intracardiac            Opacification Agent (Both Spectral and Color Flow Doppler were            utilized during procedure). REPORT CONTAINS CRITICAL RESULT Indications:    R00.0 Tachycardia  History:        Patient has prior history of Echocardiogram examinations, most                 recent 09/05/2023. Abnormal ECG, Arrythmias:Tachycardia,                 Signs/Symptoms:Dyspnea and Shortness of Breath; Risk                 Factors:Current Smoker and Dyslipidemia. Pulmonary embolus. Lung                 cancer. Chemo. ICH.  Sonographer:    Sheralyn Boatman RDCS Referring Phys: [phone] DANIEL P GOODRICH  Sonographer Comments: Technically difficult study due to poor echo windows. Port in parasternal. IMPRESSIONS  1. Left ventricular ejection fraction, by estimation, is 30 to 35%. The left ventricle has severely decreased function. The left ventricle demonstrates regional wall motion abnormalities with  akinesis of the basal to mid anteroseptal, inferoseptal, and inferior walls. Akinesis of the basal anterior wall. The function of apical segments is relatively well-preserved. This picture of wall motion abnormalities would be unusual with coronary disease and could be consistent with a "reverse Takotsubo" stress cardiomyopathy.  2. Right ventricular systolic function is normal. The right ventricular size is normal.  3. The inferior vena cava is normal in size with greater than 50% respiratory variability, suggesting right atrial pressure of 3 mmHg.  4. Limited echo. FINDINGS  Left Ventricle: Left ventricular ejection fraction, by estimation, is 30 to 35%. The left ventricle has moderately decreased function. The left ventricle demonstrates regional wall motion abnormalities. Definity contrast agent was given IV to  delineate the left ventricular endocardial borders. The left ventricular internal cavity size was normal in size. There is no left ventricular hypertrophy. Right Ventricle: The right ventricular size is normal. No increase in right ventricular wall thickness. Right ventricular systolic function is normal. Pericardium: There is no evidence of pericardial effusion. Venous: The inferior vena cava is normal in size with greater than 50% respiratory variability, suggesting right atrial pressure of 3 mmHg. Additional Comments: Spectral Doppler performed. Color Doppler performed.  LEFT VENTRICLE PLAX 2D LVIDd:         4.60 cm LVIDs:         4.00 cm LV PW:         1.00 cm LV IVS:        1.10 cm  LV Volumes (MOD) LV vol d, MOD A2C: 70.8 ml LV vol d, MOD A4C: 107.0 ml LV vol s, MOD A2C: 61.4 ml LV vol s, MOD A4C: 62.4 ml LV SV MOD A2C:     9.4 ml LV SV MOD A4C:     107.0 ml LV SV MOD BP:      35.4 ml IVC IVC diam: 1.30 cm Dalton McleanMD Electronically signed by [name] Signature Date/Time: 12/29/2023/3:26:14 PM    Final     Cardiac Studies   TTE 12/29/2023  1. Left ventricular ejection fraction, by estimation, is 30 to 35%. The  left ventricle has severely decreased function. The left ventricle  demonstrates regional wall motion abnormalities with akinesis of the basal  to mid anteroseptal, inferoseptal, and  inferior walls. Akinesis of the basal anterior wall. The function of  apical segments is relatively well-preserved. This picture of wall motion  abnormalities would be unusual with coronary disease and could be  consistent with a "reverse Takotsubo" stress  cardiomyopathy.   2. Right ventricular systolic function is normal. The right ventricular  size is normal.   3. The inferior vena cava is normal in size with greater than 50%  respiratory variability, suggesting right atrial pressure of 3 mmHg.   4. Limited echo.   Patient Profile     Shatonya Passon is a 63 y.o. female with a hx of 63 y.o.  female with medical history significant of medical issues as listed below including metastatic lung cancer (has poorly differentiated squamous cell carcinoma, right large lung mass in addition to a large necrotic right hilar/mediastinal mass along with multiple pulmonary metastatic lesions, with liver and bone mets), PE on eliquis who is being seen 12/30/2023 for the evaluation of new onset low EF at the request of Dr. Irene Limbo.   Assessment & Plan  Sinus tach In the setting of  orthostatic hypotension, pancytopenia (hgb improved, still anemic), A necrotic lung mass  -She has been on prednisone to stimulate her  appetite.  I discussed with her that we can transition to remeron to help with appetite.  Prednisone can be contributing to her tachycardia.  Will need to follow serial ECGs with risk of QTc prolongation [QTc was prolonged with prior cancer therapy, this is now normalized]  New onset stress induced systolic heart failure EF 30-35% Can be a stress cardiomyopathy.  She has no angina. She had pleuritic type chest pain.  She has no ischemic changes on her ECG.  She is euvolemic on exam.  Her BNP is negative.  -Do not suspect an ischemic cause.  Can consider a nuclear stress as an outpatient. -Can continue metoprolol tartrate 50 q 4h   -  BP normal to low at times , making GDMT up titration difficult -Continue Jardiance 10 mg daily [started on this admission] -Will see how her blood pressure tolerates and then consider MRA/ARNI     Orthostatic Hypotension Possible autonomic dysfunction component Status post aggressive IVF Can consider TED hose  She does not want to wear abdominal binder   Cardio-Onc/Erlotinib TKI/VEGFi are associated with HTN and CHF. Takotsubo is a risk with therapy and cancer itself.  She was on Tagrisso prior however her QTc was prolonged. Qtc normalized 420 ms.  Her current erlotinib therapy is held.  Would recommend repeating a limited echocardiogram prior to next dose,  would like to see EF improvement prior to continuing this therapy     Hx of PE On Eliquis  For questions or updates, please contact [facility] Please consult [email or web address] for contact info under        Signed, [name], MD  12/31/2023, 10:34 AM

## 2023-12-31 NOTE — Progress Notes (Signed)
  Progress Note   Patient: Rebecca Ochoa EAV:[id number] DOB: 1961/06/12 DOA: 12/26/2023     4 DOS: the patient was seen and examined on 12/31/2023   Brief hospital course: 63 year old woman PMH metastatic lung cancer currently undergoing chemotherapy followed by Dr. Shirline Frees, went to the cancer center 2/24, reported weakness and worsening shortness of breath, presyncopal, noted to be tachycardic and sent to the emergency department.  Treated for symptomatic anemia and dehydration with aggressive IV fluids and PRBCs 2 units without effect.  Further review of chart showed persistent tachycardia since mid December at least on multiple visits.  Echocardiogram revealed systolic dysfunction, cardiology consulted, started on beta-blocker.  Consultants Cardiology  Procedures/Events    Assessment and Plan: Symptomatic anemia secondary to chemotherapy, anemia of chronic disease Metastatic lung cancer Workup reassuring included negative CT PE, negative influenza, RSV and COVID.  Troponins negative. No improvement with 2 units PRBC and aggressive IV fluids. Responding to beta-blocker at increased dose.  Continue current therapy.   Stress-induced systolic CHF Per cardiology today thought to be possible stress cardiomyopathy Ischemic etiology not suspected Continue metoprolol Started on Jardiance   Marked sinus tachycardia since mid-December 2024 Heart rate up to 150s+ with exertion.  1 episode of NSVT 28 beats 2/28.  Improved on beta-blocker as above   Orthostatic hypotension Resolved.  TED hose if she will wear.  Refuses abdominal binder. .  Current chronic use of systemic steroids Patient on prednisone 5mg  for appetite stimulant.  Would recommend weaning off of this.   Antineoplastic chemotherapy induced pancytopenia  Platelets have recovered.  Hemoglobin appropriately increased after transfusion.  Leukopenia stable.  PMH pulmonary embolism (HCC) No evidence of recurrence on CT.  Continue  apixaban.   Aortic atherosclerosis  Liberalize activity.  Ambulate.  If stable, possible discharge tomorrow.    Subjective:  Feels about the same.  Would like to get up and walk.  Physical Exam: Vitals:   12/30/23 0522 12/30/23 0800 12/30/23 2107 12/31/23 0314  BP: 131/86  132/84 122/75  Pulse: (!) 110  93 92  Resp: 20  18 18   Temp: 98.5 F (36.9 C)  98.6 F (37 C) 98.5 F (36.9 C)  TempSrc: Oral  Oral Oral  SpO2: 93% 100% 96% 94%  Weight:      Height:       Physical Exam Vitals reviewed.  Constitutional:      General: She is not in acute distress.    Appearance: She is not ill-appearing or toxic-appearing.  Cardiovascular:     Rate and Rhythm: Normal rate and regular rhythm.     Heart sounds: No murmur heard.    Comments: Telemetry sinus rhythm.  No VT. Pulmonary:     Effort: Pulmonary effort is normal. No respiratory distress.     Breath sounds: No wheezing, rhonchi or rales.  Neurological:     Mental Status: She is alert.  Psychiatric:        Mood and Affect: Mood normal.        Behavior: Behavior normal.     Data Reviewed: WBC stable at 2.6, hemoglobin stable 10.1, platelets have recovered  Family Communication: None  Disposition: Status is: Inpatient Remains inpatient appropriate because: Tachycardia     Time spent: 20 minutes  Author: Brendia Sacks, MD 12/31/2023 8:06 AM  For on call review [email or web address].

## 2023-12-31 NOTE — Progress Notes (Signed)
 Mobility Specialist - Progress Note  Pre-mobility: 99 bpm HR,  During mobility: 126 bpm HR,  Post-mobility: 98 bpm HR,   12/31/23 1502  Mobility  Activity Ambulated with assistance in hallway  Level of Assistance Standby assist, set-up cues, supervision of patient - no hands on  Assistive Device Other (Comment) (IV Pole; Hallway Rails)  Distance Ambulated (ft) 150 ft  Range of Motion/Exercises Active  Activity Response Tolerated well  Mobility Referral Yes  Mobility visit 1 Mobility  Mobility Specialist Start Time (ACUTE ONLY) 1450  Mobility Specialist Stop Time (ACUTE ONLY) 1502  Mobility Specialist Time Calculation (min) (ACUTE ONLY) 12 min   Pt was found in bed and agreeable to ambulate. Stated feeling weak. At EOS returned to bed with all needs met. Call bell in reach.  Billey Chang Mobility Specialist

## 2024-01-01 ENCOUNTER — Other Ambulatory Visit: Payer: Self-pay | Admitting: Physician Assistant

## 2024-01-01 ENCOUNTER — Telehealth: Payer: Self-pay

## 2024-01-01 DIAGNOSIS — I428 Other cardiomyopathies: Secondary | ICD-10-CM | POA: Diagnosis not present

## 2024-01-01 DIAGNOSIS — R000 Tachycardia, unspecified: Secondary | ICD-10-CM | POA: Diagnosis not present

## 2024-01-01 DIAGNOSIS — D649 Anemia, unspecified: Secondary | ICD-10-CM | POA: Diagnosis not present

## 2024-01-01 LAB — BASIC METABOLIC PANEL
Anion gap: 9 (ref 5–15)
BUN: 9 mg/dL (ref 8–23)
CO2: 26 mmol/L (ref 22–32)
Calcium: 8.7 mg/dL — ABNORMAL LOW (ref 8.9–10.3)
Chloride: 99 mmol/L (ref 98–111)
Creatinine, Ser: 0.9 mg/dL (ref 0.44–1.00)
GFR, Estimated: >60 mL/min (ref >60)
Glucose, Bld: 103 mg/dL — ABNORMAL HIGH (ref 70–99)
Potassium: 2.5 mmol/L — CL (ref 3.5–5.1)
Sodium: 134 mmol/L — ABNORMAL LOW (ref 135–145)

## 2024-01-01 LAB — MAGNESIUM: Magnesium: 1.5 mg/dL — ABNORMAL LOW (ref 1.7–2.4)

## 2024-01-01 MED ORDER — MAGNESIUM SULFATE 4 GM/100ML IV SOLN
4.0000 g | Freq: Once | INTRAVENOUS | Status: AC
  Administered 2024-01-01: 4 g via INTRAVENOUS
  Filled 2024-01-01: qty 100

## 2024-01-01 MED ORDER — POTASSIUM CHLORIDE CRYS ER 20 MEQ PO TBCR
40.0000 meq | EXTENDED_RELEASE_TABLET | ORAL | Status: AC
  Administered 2024-01-01 – 2024-01-02 (×3): 40 meq via ORAL
  Filled 2024-01-01 (×3): qty 2

## 2024-01-01 MED ORDER — METOPROLOL SUCCINATE ER 100 MG PO TB24
100.0000 mg | ORAL_TABLET | Freq: Two times a day (BID) | ORAL | Status: DC
  Administered 2024-01-01 – 2024-01-02 (×2): 100 mg via ORAL
  Filled 2024-01-01 (×3): qty 1

## 2024-01-01 MED ORDER — METOPROLOL SUCCINATE ER 100 MG PO TB24: 100.0000 mg | ORAL_TABLET | Freq: Two times a day (BID) | ORAL | Status: DC

## 2024-01-01 MED FILL — Fosaprepitant Dimeglumine For IV Infusion 150 MG (Base Eq): INTRAVENOUS | Qty: 5 | Status: AC

## 2024-01-01 NOTE — Plan of Care (Signed)

## 2024-01-01 NOTE — Progress Notes (Signed)
 HR with ambulation documented in mobility note with Ramirez-Flores

## 2024-01-01 NOTE — Progress Notes (Signed)
 Mobility Specialist - Progress Note  Pre-mobility: 91 bpm HR,  During mobility: 115 bpm HR,  Post-mobility: 103 bpm HR,    01/01/24 1018  Mobility  Activity Ambulated with assistance in hallway  Level of Assistance Contact guard assist, steadying assist  Assistive Device Other (Comment) (IV Pole)  Distance Ambulated (ft) 100 ft  Range of Motion/Exercises Active  Activity Response Tolerated fair  Mobility Referral Yes  Mobility visit 1 Mobility  Mobility Specialist Start Time (ACUTE ONLY) 1000  Mobility Specialist Stop Time (ACUTE ONLY) 1018  Mobility Specialist Time Calculation (min) (ACUTE ONLY) 18 min   Pt was found in bed and agreeable to ambulate. Went to bathroom prior to hallway ambulation and stated feeling very fatigued, which limited distance. AT EOS returned to bed with all needs met. Call bell in reach and RN notified of session.  Billey Chang Mobility Specialist

## 2024-01-01 NOTE — Progress Notes (Signed)
 Pt refused CHG bath and refused all hygiene. Pt educated on the reason and need for CHG. Pt continued to refuse.

## 2024-01-01 NOTE — Progress Notes (Signed)
 Progress Note   Patient: Rebecca Ochoa VWU:[id number] DOB: 1961/06/06 DOA: 12/26/2023     5 DOS: the patient was seen and examined on 01/01/2024   Brief hospital course: 62 year old woman PMH metastatic lung cancer currently undergoing chemotherapy followed by Dr. Shirline Frees, went to the cancer center 2/24, reported weakness and worsening shortness of breath, presyncopal, noted to be tachycardic and sent to the emergency department.  Treated for symptomatic anemia and dehydration with aggressive IV fluids and PRBCs 2 units without effect.  Further review of chart showed persistent tachycardia since mid December at least on multiple visits.  Echocardiogram revealed systolic dysfunction, cardiology consulted, started on beta-blocker.  Consultants Cardiology  Procedures/Events   Assessment and Plan: Symptomatic anemia secondary to chemotherapy, anemia of chronic disease Metastatic lung cancer Workup reassuring included negative CT PE, negative influenza, RSV and COVID.  Troponins negative. No improvement with 2 units PRBC and aggressive IV fluids. Responding to beta-blocker at increased dose.  Will transition to Toprol-XL this evening   Stress-induced systolic CHF Per cardiology today thought to be possible stress cardiomyopathy Ischemic etiology not suspected Transition to Toprol-XL Started on Jardiance   Marked sinus tachycardia since mid-December 2024 Heart rate up to 150s+ with exertion.  1 episode of NSVT 28 beats 2/28.  Episode of narrow complex tachycardia this afternoon.  Symptomatic.  Heart rate around 180.  Hold discharge.  Conferred with cardiology.  Will check labs.   Orthostatic hypotension Resolved.  TED hose if she will wear.  Refuses abdominal binder. .  Current chronic use of systemic steroids Patient on prednisone 5mg  for appetite stimulant.  This has been discontinued.   Antineoplastic chemotherapy induced pancytopenia  Platelets have recovered.  Hemoglobin  appropriately increased after transfusion.  Leukopenia stable.   PMH pulmonary embolism (HCC) No evidence of recurrence on CT.  Continue apixaban.   Aortic atherosclerosis     Subjective:  Feels tired but was able to ambulate. Had a unpleasant feeling this afternoon in her chest, noted her heart rate to be 183.  Physical Exam: Vitals:   01/01/24 1348 01/01/24 1349 01/01/24 1350 01/01/24 1402  BP:    127/77  Pulse:    97  Resp: (!) 30 (!) 30 (!) 29   Temp:      TempSrc:      SpO2:      Weight:      Height:       Physical Exam Vitals reviewed.  Constitutional:      General: She is not in acute distress.    Appearance: She is not ill-appearing or toxic-appearing.  Cardiovascular:     Rate and Rhythm: Regular rhythm. Tachycardia present.     Heart sounds: No murmur heard.    Comments: Telemetry sinus tach low 100s.  There is a long run of what appears to be narrow complex tachycardia. Pulmonary:     Effort: Pulmonary effort is normal. No respiratory distress.     Breath sounds: No wheezing, rhonchi or rales.  Neurological:     Mental Status: She is alert.  Psychiatric:        Mood and Affect: Mood normal.        Behavior: Behavior normal.     Data Reviewed: No new data at this point Electrolytes pending  Family Communication: none   Disposition: Status is: Inpatient Remains inpatient appropriate because: tachycardia     Time spent: 20 minutes  Author: Brendia Sacks, MD 01/01/2024 2:11 PM  For on call review [email or web address].

## 2024-01-01 NOTE — Progress Notes (Signed)
 Limited echo - takotsubo

## 2024-01-01 NOTE — Telephone Encounter (Signed)
 Inpatient cardiologist called this morning and stated that the patient has cardiomyopathy and suggests that patient does not proceed with chemotherapy infusion tomorrow.  Information relayed to Dr. Arbutus Ped.  Per Dr. Arbutus Ped- Patient will be evaluated once out of the hospital to proceed with treatment.

## 2024-01-01 NOTE — Progress Notes (Signed)
   Patient Name: Rebecca Ochoa Date of Encounter: 01/01/2024 [facility] Cardiologist: [name], MD   Interval Summary  .    Yesterday when she walked in the hall she had chest pain, but none today.  She did feel tired today and thinks it is because of the beta-blocker. Not requiring oxygen.  No arrhythmia on telemetry.  Vital Signs .    Vitals:   12/31/23 1100 12/31/23 1405 12/31/23 2024 01/01/24 0614  BP:  111/71 (!) 151/90 136/79  Pulse: 100 (!) 104 91 (!) 105  Resp: 20 20 19 18   Temp:  98.2 F (36.8 C) 98.2 F (36.8 C) 99.1 F (37.3 C)  TempSrc:  Oral Oral Oral  SpO2:  98% 94% 92%  Weight:      Height:        Intake/Output Summary (Last 24 hours) at 01/01/2024 1222 Last data filed at 12/31/2023 1800 Gross per 24 hour  Intake 240 ml  Output 0 ml  Net 240 ml      12/26/2023    8:56 AM 12/13/2023    9:14 AM 12/08/2023    7:56 AM  Last 3 Weights  Weight (lbs) 129 lb 137 lb 137 lb 6.4 oz  Weight (kg) 58.514 kg 62.143 kg 62.324 kg      Telemetry/ECG    Normal sinus rhythm/mild sinus tachycardia- Personally Reviewed  Physical Exam .   GEN: No acute distress.   Neck: No JVD Cardiac: RRR, no murmurs, rubs, or gallops.  Respiratory: Clear to auscultation bilaterally. GI: Soft, nontender, non-distended  MS: No edema  Assessment & Plan .     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  The echocardiogram showed marked reduction in LVEF to 30-35%, with atypical distribution of wall motion abnormality, not consistent with coronary disease, suggestive of Takotsubo cardiomyopathy.  She has some fatigue on beta-blocker therapy, but was able to walk out in the halls without shortness of  breath or chest pain.  No problems with hypotension.  Improved following volume resuscitation, without triggering heart failure.  She is currently on metoprolol tartrate total of 200 mg a day and is on Jardiance.    QTc today 458 ms (Fridericia).Remeron started for appetite.  [facility] will sign off.   Medication Recommendations:   Metoprolol succinate 100 mg twice daily Jardiance 10 mg daily Eliquis 5 mg twice daily Other recommendations (labs, testing, etc): Plan repeat limited echocardiogram in the office in 1 week (scheduled Tuesday 03/11).  Would hold her chemotherapy until we confirm that LVEF is returning to normal. OK to continue Tarceva. Follow up as an outpatient:  03/06/2024 with Edd Fabian, NP. If LVEF has not recovered, will bring her back sooner.    For questions or updates, please contact  [facility] Please consult [email or web address] for contact info under        Signed, Thurmon Fair, MD

## 2024-01-01 NOTE — Progress Notes (Signed)
 Potassium result received via telephone from lab of 2.5. Secure chat sent to Dr Irene Limbo and seen immediately. Orders received and awaiting pharmacy verification.

## 2024-01-01 NOTE — Progress Notes (Signed)
 Heart Failure Navigator Progress Note  Assessed for Heart & Vascular TOC clinic readiness.  Patient does not meet criteria due to Metastatic Lung Cancer. .   Navigator will sign off at this time.   Rhae Hammock, BSN, Scientist, clinical (histocompatibility and immunogenetics) Only

## 2024-01-02 ENCOUNTER — Inpatient Hospital Stay: Payer: No Typology Code available for payment source | Attending: Internal Medicine | Admitting: Dietician

## 2024-01-02 ENCOUNTER — Inpatient Hospital Stay: Payer: No Typology Code available for payment source | Admitting: Internal Medicine

## 2024-01-02 ENCOUNTER — Inpatient Hospital Stay: Payer: No Typology Code available for payment source

## 2024-01-02 ENCOUNTER — Ambulatory Visit: Payer: No Typology Code available for payment source | Admitting: Physician Assistant

## 2024-01-02 ENCOUNTER — Other Ambulatory Visit: Payer: No Typology Code available for payment source

## 2024-01-02 ENCOUNTER — Ambulatory Visit: Payer: No Typology Code available for payment source

## 2024-01-02 DIAGNOSIS — I428 Other cardiomyopathies: Secondary | ICD-10-CM | POA: Diagnosis not present

## 2024-01-02 DIAGNOSIS — R000 Tachycardia, unspecified: Secondary | ICD-10-CM | POA: Diagnosis not present

## 2024-01-02 DIAGNOSIS — D649 Anemia, unspecified: Secondary | ICD-10-CM | POA: Diagnosis not present

## 2024-01-02 LAB — BASIC METABOLIC PANEL
Anion gap: 8 (ref 5–15)
BUN: 11 mg/dL (ref 8–23)
CO2: 24 mmol/L (ref 22–32)
Calcium: 8.8 mg/dL — ABNORMAL LOW (ref 8.9–10.3)
Chloride: 105 mmol/L (ref 98–111)
Creatinine, Ser: 0.96 mg/dL (ref 0.44–1.00)
GFR, Estimated: >60 mL/min (ref >60)
Glucose, Bld: 147 mg/dL — ABNORMAL HIGH (ref 70–99)
Potassium: 4.1 mmol/L (ref 3.5–5.1)
Sodium: 137 mmol/L (ref 135–145)

## 2024-01-02 LAB — MAGNESIUM: Magnesium: 2.6 mg/dL — ABNORMAL HIGH (ref 1.7–2.4)

## 2024-01-02 MED ORDER — HEPARIN SOD (PORK) LOCK FLUSH 100 UNIT/ML IV SOLN
500.0000 [IU] | INTRAVENOUS | Status: AC | PRN
  Administered 2024-01-02: 500 [IU]

## 2024-01-02 MED ORDER — METOPROLOL SUCCINATE ER 100 MG PO TB24: 100.0000 mg | ORAL_TABLET | Freq: Two times a day (BID) | ORAL | 0 refills | Status: DC

## 2024-01-02 MED ORDER — LOSARTAN POTASSIUM 25 MG PO TABS: 12.5000 mg | ORAL_TABLET | Freq: Every day | ORAL | 0 refills | Status: DC

## 2024-01-02 MED ORDER — POTASSIUM CHLORIDE CRYS ER 20 MEQ PO TBCR: 20.0000 meq | EXTENDED_RELEASE_TABLET | Freq: Every day | ORAL | 0 refills | Status: DC

## 2024-01-02 MED ORDER — LOSARTAN POTASSIUM 25 MG PO TABS: 12.5000 mg | ORAL_TABLET | Freq: Every day | ORAL | Status: DC

## 2024-01-02 MED ORDER — EMPAGLIFLOZIN 10 MG PO TABS: 10.0000 mg | ORAL_TABLET | Freq: Every day | ORAL | 0 refills | Status: DC

## 2024-01-02 NOTE — Progress Notes (Signed)
   Patient Name: Srinika Delone Date of Encounter: 01/02/2024 [facility] Cardiologist: [name], MD   Interval Summary  .    Just before planned discharge yesterday she developed symptomatic nonsustained ventricular tachycardia at about 180 bpm.  Although nonsustained, the episode was lengthy, almost 30 beats in duration. Labs that were subsequently checked shows severe hypokalemia and moderate hypomagnesemia.  She has received supplements.  Repeat labs today are pending. No further ventricular arrhythmia on monitor.  She does have some mild resting tachycardia, but consistently sinus rhythm and probably compensatory for anemia and decreased LVEF.  Vital Signs .    Vitals:   01/01/24 2000 01/02/24 0545 01/02/24 0950 01/02/24 0952  BP:  (!) 138/93 116/85 116/85  Pulse:  (!) 103 (!) 121 (!) 121  Resp: (!) 27  18 18   Temp: 98.5 F (36.9 C) 98 F (36.7 C)    TempSrc: Oral Oral    SpO2: 94% 95%    Weight:      Height:        Intake/Output Summary (Last 24 hours) at 01/02/2024 1056 Last data filed at 01/02/2024 0400 Gross per 24 hour  Intake 480 ml  Output --  Net 480 ml      12/26/2023    8:56 AM 12/13/2023    9:14 AM 12/08/2023    7:56 AM  Last 3 Weights  Weight (lbs) 129 lb 137 lb 137 lb 6.4 oz  Weight (kg) 58.514 kg 62.143 kg 62.324 kg      Telemetry/ECG    Normal sinus rhythm/mild sinus tachycardia- Personally Reviewed  QTc normal (QT 330 ms at 108 bpm, Bazett QTc 443 ms)  Physical Exam .   GEN: No acute distress.   Neck: No JVD Cardiac: RRR, no murmurs, rubs, or gallops.  Respiratory: Clear to auscultation bilaterally. GI: Soft, nontender, non-distended  MS: No edema  Assessment & Plan .      On Jardiance and metoprolol succinate.  Will add a low-dose of angiotensin receptor blocker since blood pressure appears to allow it. Waiting for repeat electrolytes.  If these are in a safe range she can be discharged with plan as outlined yesterday (repeat  echo in a week, follow-up in clinic based on echo findings).  Defer resumption of chemotherapy until EF improves.  Medication Recommendations:   Metoprolol succinate 100 mg twice daily Losartan 12.5 mg daily Jardiance 10 mg daily Eliquis 5 mg twice daily Other recommendations (labs, testing, etc): Plan repeat limited echocardiogram in the office in 1 week (scheduled Tuesday 03/11).  Would hold her chemotherapy until we confirm that LVEF is returning to normal. OK to continue Tarceva. Follow up as an outpatient:  03/06/2024 with Edd Fabian, NP. If LVEF has not recovered, will bring her back sooner.    For questions or updates, please contact [facility] Please consult [email or web address] for contact info under        Signed, Thurmon Fair, MD

## 2024-01-02 NOTE — Plan of Care (Signed)

## 2024-01-02 NOTE — Discharge Summary (Signed)
 Physician Discharge Summary   Patient: Rebecca Ochoa MRN: [id number] DOB: 07-15-1961  Admit date:     12/26/2023  Discharge date: 01/02/24  Discharge Physician: Brendia Sacks   PCP: Patient, No Pcp Per   Recommendations at discharge:   Stress-induced systolic CHF  Medication Recommendations:   Metoprolol succinate 100 mg twice daily Losartan 12.5 mg daily Jardiance 10 mg daily Eliquis 5 mg twice daily Other recommendations (labs, testing, etc): Plan repeat limited echocardiogram in the office in 1 week (scheduled Tuesday 03/11).  Would hold her chemotherapy until we confirm that LVEF is returning to normal. OK to continue Tarceva. Follow up as an outpatient:  03/06/2024 with Edd Fabian, NP. If LVEF has not recovered, will bring her back sooner.   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 [facility] Course: 63 year old woman PMH metastatic lung cancer currently undergoing chemotherapy followed by Dr. Shirline Frees, went to the cancer center 2/24, reported weakness and worsening shortness of breath, presyncopal, noted to be tachycardic and sent to the emergency department.  Treated for symptomatic anemia and dehydration with aggressive IV fluids and PRBCs 2 units without effect.  Further review of chart showed persistent tachycardia since mid December at least on multiple visits.  Echocardiogram revealed systolic dysfunction, cardiology consulted, started on beta-blocker.  Tachycardia was difficult to control, eventually stabilized and discharged home in good  condition.  Consultants Cardiology  Procedures/Events  Symptomatic anemia secondary to chemotherapy, anemia of chronic disease Metastatic lung cancer Workup reassuring included negative CT PE, negative influenza, RSV and COVID.  Troponins negative. No improvement with 2 units PRBC and aggressive IV fluids. Responding to beta-blocker at increased dose.    Stress-induced systolic CHF Per cardiology thought to be possible stress cardiomyopathy Ischemic etiology not suspected  Medication Recommendations:   Metoprolol succinate 100 mg twice daily Losartan 12.5 mg daily Jardiance 10 mg daily Eliquis 5 mg twice daily Other recommendations (labs, testing, etc): Plan repeat limited echocardiogram in the office in 1 week (scheduled Tuesday 03/11).  Would hold her chemotherapy until we confirm that LVEF is returning to normal. OK to continue Tarceva. Follow up as an outpatient:  03/06/2024 with Edd Fabian, NP. If LVEF has not recovered, will bring her back sooner.   Marked sinus tachycardia since mid-December 2024 Heart rate up to 150s+ with exertion.  1 episode of NSVT 28 beats 2/28.  Episode of narrow complex tachycardia 3/3    Orthostatic hypotension Resolved.  TED hose if she will wear.  Refuses abdominal binder. .  Current chronic use of systemic steroids Patient on prednisone 5mg  for appetite stimulant.  This has been discontinued.   Antineoplastic chemotherapy induced pancytopenia  Platelets have recovered.  Hemoglobin appropriately increased after transfusion.  Leukopenia stable.   PMH pulmonary embolism (HCC) No evidence of recurrence on CT.  Continue apixaban.   Aortic atherosclerosis      Disposition: Home Diet recommendation:  Regular diet DISCHARGE MEDICATION: Allergies as of 01/02/2024       Reactions   Biaxin [clarithromycin] Nausea Only        Medication List     STOP taking these medications  predniSONE 10 MG tablet Commonly known as:  DELTASONE       TAKE these medications    acetaminophen 500 MG tablet Commonly known as: TYLENOL Take 1,000 mg by mouth every 8 (eight) hours as needed for mild pain.   ALPRAZolam 0.5 MG tablet Commonly known as: XANAX Take 1 tablet (0.5 mg total) by mouth 3 (three) times daily as needed for anxiety. What changed: when to take this   apixaban 5 MG Tabs tablet Commonly known as: ELIQUIS Take 1 tablet (5 mg total) by mouth 2 (two) times daily.   cyanocobalamin 500 MCG tablet Commonly known as: VITAMIN B12 Take 1 tablet (500 mcg total) by mouth daily.   dexamethasone 4 MG tablet Commonly known as: DECADRON Take 1 tablet twice a day the day before, the day of, and the day after chemotherapy   empagliflozin 10 MG Tabs tablet Commonly known as: JARDIANCE Take 1 tablet (10 mg total) by mouth daily.   erlotinib 100 MG tablet Commonly known as: TARCEVA Take 1 tablet (100 mg total) by mouth daily. Take on an empty stomach 1 hour before meals or 2 hours after   famotidine 20 MG tablet Commonly known as: PEPCID Take 1 tablet (20 mg total) by mouth at bedtime.   folic acid 1 MG tablet Commonly known as: FOLVITE TAKE 1 TABLET BY MOUTH EVERY DAY   GAS-X PO Take 2 tablets by mouth daily as needed.   HYDROcodone-acetaminophen 5-325 MG tablet Commonly known as: NORCO/VICODIN Take 1 tablet by mouth every 6 (six) hours as needed. What changed: reasons to take this   loperamide 2 MG capsule Commonly known as: IMODIUM Take 1 capsule (2 mg total) by mouth as needed for diarrhea or loose stools.   losartan 25 MG tablet Commonly known as: COZAAR Take 0.5 tablets (12.5 mg total) by mouth daily.   metoprolol succinate 100 MG 24 hr tablet Commonly known as: Toprol XL Take 1 tablet (100 mg total) by mouth in the morning and at bedtime. Take with or immediately following a meal.   nystatin powder Commonly known as: MYCOSTATIN/NYSTOP Apply 1 Application topically 3 (three) times  daily. Apply to under breast for rash if needed.   potassium chloride SA 20 MEQ tablet Commonly known as: KLOR-CON M Take 1 tablet (20 mEq total) by mouth daily for 14 days.   prochlorperazine 10 MG tablet Commonly known as: COMPAZINE Take 1 tablet (10 mg total) by mouth every 6 (six) hours as needed for nausea or vomiting.   temazepam 30 MG capsule Commonly known as: RESTORIL Take 1 capsule (30 mg total) by mouth at bedtime as needed for sleep. What changed: when to take this        Follow-up Information     [facility]. Schedule an appointment as soon as possible for a visit.   Contact information: [address] [place] [address] [phone]        [facility] Follow up on 01/09/2024.   Specialty: Cardiology Why: entrance C appt at 9AM - please present to heart and vascular center at 8:45 AM Contact information: [address] [place] [address] [phone]               Discharge Exam: Ceasar Mons Weights   12/26/23 0856  Weight: 58.5 kg   Physical Exam Vitals reviewed.  Constitutional:      General: She is not in  acute distress.    Appearance: She is not ill-appearing or toxic-appearing.  Cardiovascular:     Rate and Rhythm: Regular rhythm. Tachycardia present.     Heart sounds: No murmur heard. Pulmonary:     Effort: Pulmonary effort is normal. No respiratory distress.     Breath sounds: No wheezing, rhonchi or rales.  Neurological:     Mental Status: She is alert.  Psychiatric:        Mood and Affect: Mood normal.        Behavior: Behavior normal.      Condition at discharge: good  The results of significant diagnostics from this hospitalization (including imaging, microbiology, ancillary and laboratory) are listed below for reference.   Imaging Studies: ECHOCARDIOGRAM LIMITED Result Date: 12/29/2023     ECHOCARDIOGRAM LIMITED REPORT   Patient Name:   ELMINA HENDEL Date of Exam: 12/29/2023 Medical Rec #:  [id number]    Height:       65.0 in Accession #:    [id number]   Weight:       129.0 lb Date of Birth:  1960-11-13     BSA:          1.642 m Patient Age:    63 years     BP:           127/94 mmHg Patient Gender: F            HR:           124 bpm. Exam Location:  Inpatient Procedure: Limited Echo, Cardiac Doppler, Color Doppler and Intracardiac            Opacification Agent (Both Spectral and Color Flow Doppler were            utilized during procedure). REPORT CONTAINS CRITICAL RESULT Indications:    R00.0 Tachycardia  History:        Patient has prior history of Echocardiogram examinations, most                 recent 09/05/2023. Abnormal ECG, Arrythmias:Tachycardia,                 Signs/Symptoms:Dyspnea and Shortness of Breath; Risk                 Factors:Current Smoker and Dyslipidemia. Pulmonary embolus. Lung                 cancer. Chemo. ICH.  Sonographer:    Sheralyn Boatman RDCS Referring Phys: [phone] Bhavana Kady P Aceson Labell  Sonographer Comments: Technically difficult study due to poor echo windows. Port in parasternal. IMPRESSIONS  1. Left ventricular ejection fraction, by estimation, is 30 to 35%. The left ventricle has severely decreased function. The left ventricle demonstrates regional wall motion abnormalities with akinesis of the basal to mid anteroseptal, inferoseptal, and inferior walls. Akinesis of the basal anterior wall. The function of apical segments is relatively well-preserved. This picture of wall motion abnormalities would be unusual with coronary disease and could be consistent with a "reverse Takotsubo" stress cardiomyopathy.  2. Right ventricular systolic function is normal. The right ventricular size is normal.  3. The inferior vena cava is normal in size with greater than 50% respiratory variability, suggesting right atrial pressure of 3 mmHg.  4. Limited echo. FINDINGS  Left Ventricle: Left  ventricular ejection fraction, by estimation, is 30 to 35%. The left ventricle has moderately decreased function. The left ventricle demonstrates regional wall motion abnormalities. Definity contrast agent was given IV to delineate the left  ventricular endocardial borders. The left ventricular internal cavity size was normal in size. There is no left ventricular hypertrophy. Right Ventricle: The right ventricular size is normal. No increase in right ventricular wall thickness. Right ventricular systolic function is normal. Pericardium: There is no evidence of pericardial effusion. Venous: The inferior vena cava is normal in size with greater than 50% respiratory variability, suggesting right atrial pressure of 3 mmHg. Additional Comments: Spectral Doppler performed. Color Doppler performed.  LEFT VENTRICLE PLAX 2D LVIDd:         4.60 cm LVIDs:         4.00 cm LV PW:         1.00 cm LV IVS:        1.10 cm  LV Volumes (MOD) LV vol d, MOD A2C: 70.8 ml LV vol d, MOD A4C: 107.0 ml LV vol s, MOD A2C: 61.4 ml LV vol s, MOD A4C: 62.4 ml LV SV MOD A2C:     9.4 ml LV SV MOD A4C:     107.0 ml LV SV MOD BP:      35.4 ml IVC IVC diam: 1.30 cm Dalton McleanMD Electronically signed by [name] Signature Date/Time: 12/29/2023/3:26:14 PM    Final    CT ABDOMEN PELVIS WO CONTRAST Result Date: 12/26/2023 CLINICAL DATA:  History of non-small cell lung cancer, abdominal pain, anemia EXAM: CT ABDOMEN AND PELVIS WITHOUT CONTRAST TECHNIQUE: Multidetector CT imaging of the abdomen and pelvis was performed following the standard protocol without IV contrast. RADIATION DOSE REDUCTION: This exam was performed according to the departmental dose-optimization program which includes automated exposure control, adjustment of the mA and/or kV according to patient size and/or use of iterative reconstruction technique. COMPARISON:  11/09/2023, 12/26/2023 FINDINGS: Lower chest: Multiple pulmonary nodules and right hilar mass are seen  consistent with known metastatic lung cancer. Please see CT chest performed earlier today. Hepatobiliary: Numerous hypodense masses are seen throughout the liver, demonstrating decreased size and central necrosis since prior study. Index lesion in the left lobe liver measures 6.0 x 3.5 cm reference image 10/2, previously 8.3 x 4.5 cm. No evidence of cholelithiasis or cholecystitis. No evidence of biliary duct dilation. Pancreas: Unremarkable. No pancreatic ductal dilatation or surrounding inflammatory changes. Spleen: Normal in size without focal abnormality. The hypodense lesions seen on prior study, and visualized on earlier CT chest performed today, are not well visualized on this exam likely due to phase of contrast enhancement. Adrenals/Urinary Tract: Numerous indeterminate bilateral renal hypodensities are again noted. Index lesion in the interpolar right kidney reference image 17/2 measures 1.7 cm, previously measuring 1.8 cm. No hydronephrosis or nephrolithiasis. The adrenals are unremarkable. Excreted contrast within the urinary bladder with no filling defect identified. Stomach/Bowel: No bowel obstruction or ileus. Normal appendix right lower quadrant. Diverticulosis of the sigmoid colon without evidence of acute diverticulitis. Vascular/Lymphatic: Stable aortic atherosclerosis. There is persistent adenopathy at the porta hepatis, with decreased areas of necrosis and cavitation since prior study. Index lymph node on image 16/2 measures 2.4 cm in short axis, unchanged by my measurements since prior. No new adenopathy identified. Reproductive: Uterus and bilateral adnexa are unremarkable. Other: No free fluid or free intraperitoneal gas. No abdominal wall hernia. Musculoskeletal: No acute or destructive bony abnormalities. Reconstructed images demonstrate no additional findings. IMPRESSION: 1. Right hilar mass and numerous bilateral pulmonary nodules consistent with known history of metastatic lung cancer.  Please see chest CT performed earlier today. 2. Numerous hypodense masses within the liver, demonstrating decrease in size and  resolution of central necrosis/cavitation on prior study. 3. Continued portacaval adenopathy, without significant change in size of lymph nodes. The central cavitation/necrosis has resolved. 4. Numerous bilateral renal hypodensities, too small to characterize, but grossly stable since prior exam. Metastatic disease remains the diagnosis of exclusion. 5. The splenic hypodensity seen on previous study and visualized on earlier chest CT today are not well visualized on this exam, likely due to delayed imaging relative to contrast administration performed earlier for chest CT. 6. Sigmoid diverticulosis without diverticulitis. 7. Aortic Atherosclerosis (ICD10-I70.0). Electronically Signed   By: Sharlet Salina M.D.   On: 12/26/2023 18:40   CT Angio Chest PE W and/or Wo Contrast Result Date: 12/26/2023 CLINICAL DATA:  Concern for pulmonary embolism. History of non-small cell lung cancer. EXAM: CT ANGIOGRAPHY CHEST WITH CONTRAST TECHNIQUE: Multidetector CT imaging of the chest was performed using the standard protocol during bolus administration of intravenous contrast. Multiplanar CT image reconstructions and MIPs were obtained to evaluate the vascular anatomy. RADIATION DOSE REDUCTION: This exam was performed according to the departmental dose-optimization program which includes automated exposure control, adjustment of the mA and/or kV according to patient size and/or use of iterative reconstruction technique. CONTRAST:  75mL OMNIPAQUE IOHEXOL 350 MG/ML SOLN COMPARISON:  CT dated 11/09/2023. FINDINGS: Cardiovascular: There is no cardiomegaly or pericardial effusion. Mild atherosclerotic calcification of the thoracic aorta. No aneurysmal dilatation or dissection. The origins of the great vessels of the aortic arch appear patent. Left-sided Port-A-Cath with tip at the cavoatrial junction. No  pulmonary artery embolus identified. Mediastinum/Nodes: Right hilar mass encasing the right pulmonary artery and right upper and middle lobe bronchi relatively similar to prior CT. The esophagus is grossly unremarkable. No mediastinal fluid collection. Lungs/Pleura: Multiple bilateral pulmonary nodules similar or slightly decreased since the prior CT consistent with metastatic disease. No consolidative changes. Trace right pleural effusion. No pneumothorax. The central airways are patent. Upper Abdomen: Retroperitoneal adenopathy and left liver masses. Musculoskeletal: Osteopenia with degenerative changes. No acute osseous pathology. Review of the MIP images confirms the above findings. IMPRESSION: 1. No CT evidence of pulmonary artery embolus. 2. Right hilar mass and pulmonary metastasis similar or slightly decreased in size since the prior CT. 3. Retroperitoneal adenopathy and left liver masses. 4.  Aortic Atherosclerosis (ICD10-I70.0). Electronically Signed   By: Elgie Collard M.D.   On: 12/26/2023 14:18   DG Chest 1 View Result Date: 12/26/2023 CLINICAL DATA:  Chest pain, cough, and weakness. EXAM: CHEST  1 VIEW COMPARISON:  11/09/2023. FINDINGS: The heart size and mediastinal contours are within normal limits. There is atherosclerotic calcification of the aorta scattered opacities with right upper lobe and right hilar mass are noted in the lungs bilaterally, compatible with known pulmonary nodules and masses. No effusion or pneumothorax is seen. A left chest port is stable in position. No acute osseous abnormality. IMPRESSION: Scattered opacities in the lungs bilaterally with right upper lobe and right hilar masslike consolidation, increased from the prior exam. Electronically Signed   By: Thornell Sartorius M.D.   On: 12/26/2023 10:46    Microbiology: Results for orders placed or performed during the hospital encounter of 12/26/23  Resp panel by RT-PCR (RSV, Flu A&B, Covid) Anterior Nasal Swab      Status: None   Collection Time: 12/26/23  9:35 AM   Specimen: Anterior Nasal Swab  Result Value Ref Range Status   SARS Coronavirus 2 by RT PCR NEGATIVE NEGATIVE Final    Comment: (NOTE) SARS-CoV-2 target nucleic acids are NOT DETECTED.  The SARS-CoV-2 RNA is generally detectable in upper respiratory specimens during the acute phase of infection. The lowest concentration of SARS-CoV-2 viral copies this assay can detect is 138 copies/mL. A negative result does not preclude SARS-Cov-2 infection and should not be used as the sole basis for treatment or other patient management decisions. A negative result may occur with  improper specimen collection/handling, submission of specimen other than nasopharyngeal swab, presence of viral mutation(s) within the areas targeted by this assay, and inadequate number of viral copies(<138 copies/mL). A negative result must be combined with clinical observations, patient history, and epidemiological information. The expected result is Negative.  Fact Sheet for Patients:  BloggerCourse.com  Fact Sheet for Healthcare Providers:  SeriousBroker.it  This test is no t yet approved or cleared by the [place] FDA and  has been authorized for detection and/or diagnosis of SARS-CoV-2 by FDA under an Emergency Use Authorization (EUA). This EUA will remain  in effect (meaning this test can be used) for the duration of the COVID-19 declaration under Section 564(b)(1) of the Act, 21 U.S.C.section 360bbb-3(b)(1), unless the authorization is terminated  or revoked sooner.       Influenza A by PCR NEGATIVE NEGATIVE Final   Influenza B by PCR NEGATIVE NEGATIVE Final    Comment: (NOTE) The Xpert Xpress SARS-CoV-2/FLU/RSV plus assay is intended as an aid in the diagnosis of influenza from Nasopharyngeal swab specimens and should not be used as a sole basis for treatment. Nasal washings and aspirates are  unacceptable for Xpert Xpress SARS-CoV-2/FLU/RSV testing.  Fact Sheet for Patients: BloggerCourse.com  Fact Sheet for Healthcare Providers: SeriousBroker.it  This test is not yet approved or cleared by the [place] FDA and has been authorized for detection and/or diagnosis of SARS-CoV-2 by FDA under an Emergency Use Authorization (EUA). This EUA will remain in effect (meaning this test can be used) for the duration of the COVID-19 declaration under Section 564(b)(1) of the Act, 21 U.S.C. section 360bbb-3(b)(1), unless the authorization is terminated or revoked.     Resp Syncytial Virus by PCR NEGATIVE NEGATIVE Final    Comment: (NOTE) Fact Sheet for Patients: BloggerCourse.com  Fact Sheet for Healthcare Providers: SeriousBroker.it  This test is not yet approved or cleared by the [place] FDA and has been authorized for detection and/or diagnosis of SARS-CoV-2 by FDA under an Emergency Use Authorization (EUA). This EUA will remain in effect (meaning this test can be used) for the duration of the COVID-19 declaration under Section 564(b)(1) of the Act, 21 U.S.C. section 360bbb-3(b)(1), unless the authorization is terminated or revoked.  Performed at [facility], [address], [place], [place] [address]   Culture, blood (routine x 2)     Status: None   Collection Time: 12/26/23  9:36 AM   Specimen: BLOOD  Result Value Ref Range Status   Specimen Description   Final    BLOOD SITE NOT SPECIFIED Performed at [facility], [address] [place], [place] [address]    Special Requests   Final    BOTTLES DRAWN AEROBIC AND ANAEROBIC Blood Culture adequate volume Performed at [facility], [address], [place], [place] [address]    Culture   Final    NO GROWTH 5 DAYS Performed at [facility],  [address], [place], [place] [address]    Report Status 12/31/2023 FINAL  Final    Labs: CBC: Recent Labs  Lab 12/27/23 [id number] 12/28/23 0247 12/31/23 [phone]  WBC 2.2* 2.1* 2.6*  HGB 7.7* 9.4* 10.1*  HCT 24.5* 29.3* 32.0*  MCV 85.1 84.4 87.0  PLT 78* 102* 192   Basic Metabolic Panel: Recent Labs  Lab 12/28/23 0247 12/28/23 1701 12/29/23 0206 01/01/24 1659 01/02/24 1105  NA 136 135 137 134* 137  K 3.0* 3.8 4.2 2.5* 4.1  CL 102 105 106 99 105  CO2 25 25 24 26 24   GLUCOSE 82 95 82 103* 147*  BUN 8 7* <5* 9 11  CREATININE 0.78 0.63 0.67 0.90 0.96  CALCIUM 8.5* 8.5* 8.8* 8.7* 8.8*  MG 1.6* 1.9 2.2 1.5* 2.6*  PHOS  --  2.6 3.1  --   --    Liver Function Tests: No results for input(s): "AST", "ALT", "ALKPHOS", "BILITOT", "PROT", "ALBUMIN" in the last 168 hours. CBG: No results for input(s): "GLUCAP" in the last 168 hours.  Discharge time spent: less than 30 minutes.  Signed: Brendia Sacks, MD [facility] 01/02/2024

## 2024-01-04 ENCOUNTER — Telehealth: Payer: Self-pay | Admitting: Cardiology

## 2024-01-04 ENCOUNTER — Telehealth: Payer: Self-pay | Admitting: Internal Medicine

## 2024-01-04 NOTE — Telephone Encounter (Signed)
 STAT if HR is under 50 or over 120 (normal HR is 60-100 beats per minute)  What is your heart rate?  113 Hr : yesterday  65-70 Hr : today  97/58 Bp : today   He states these were taken before taking medications     Do you have any other symptoms? No    Pt spouse called in concerned about pt's BP and HR is lower today. He is concerned this has happened due to during hospital stay they added a beta blocker and BP medication to her list. Please advise.

## 2024-01-04 NOTE — Telephone Encounter (Signed)
 Patient identification verified by 2 forms. Marilynn Rail, RN    Called and spoke to patients husband Garlan Fair states:   -patient recently discharged from Hospital   -Heart range yesterday 110-115  -Heart rate this morning is 65-70   -concerned about sudden decrease of heart rate   -Takes Metoprolol Succinate 100mg  twice daily per hospital discharge   -BP this morning: 97/58  -Losartan took 25mg  yesterday instead of 12.5mg    -has not taken any losartan or Metoprolol this morning   -since starting Metoprolol patient has a lot of fatigue Kathlene November denies:   -lightheadedness/dizziness  Informed mike message sent to Dr. Wyline Mood for input/advisement  Kathlene November verbalized understanding, no questions at this time

## 2024-01-04 NOTE — Telephone Encounter (Signed)
   Received after hours call from patient and significant other. Patient had lower BP this morning of 97/14mmHg with HR in the 65s. Patient was concerned about dosing of her medications (Metoprolol Succinate 100mg  BID, Losartan 12.5mg ) and causing further hypotension. She called office, with concerns, not clear if called back. Patient made independent decision to skip both AM dose of Metoprolol and also Losartan.   I spend 10 minutes this evening discussing with patient and significant other. Despite lower BP and HR lower than normal for patient, she has not had dizziness or lightheadedness. Admits to greater than normal fatigue today. BP at time of our conversation was 120/76 with HR in the 120s. (Patient reports diarrhea today and says that this typically raises her HR). Upon further discussion of medications, it does appear that patient accidentally took full 25mg  of Losartan on 3/5. Given potential for dehydration with chronic diarrhea, I advised patient to discontinue Losartan. Discussed continuing Metoprolol Succinate 100mg  BID but I gave hold parameters for HR less than 70 BPM or BP less than 100 systolic. Advised to call office back if further concerns. Patient and spouse agreeable to this plan, confirmed their understanding.  Perlie Gold, PA-C

## 2024-01-05 ENCOUNTER — Telehealth: Payer: Self-pay | Admitting: Physician Assistant

## 2024-01-05 NOTE — Progress Notes (Signed)
 [facility] OFFICE PROGRESS NOTE  Patient, No Pcp Per No address on file  DIAGNOSIS: Stage IVB ( T3, N2, M1c) non-Small Cell Lung Cancer, poorly differentiated squamous cell carcinoma presented with large right upper lobe lung mass in addition to large necrotic right hilar/mediastinal mass and innumerable hypermetabolic pulmonary metastatic lesions in addition to liver and bone metastasis as well as abdominal celiac axis lymphadenopathy diagnosed in October 2024.    PDL1: 0%   Molecular Studies: Positive for EGFR G719A  PRIOR THERAPY: 1) Palliative radiotherapy to the large hilar and mediastinal mass under the care of Dr. Roselind Messier last dose on 09/04/23 2) Targeted treatment with Tagrisso 80 mg p.o. daily discontinued in February 2025 due to prolonged Qtc 3) She will start concurrent systemic chemotherapy with carboplatin for AUC of 5 and Alimta 500 Mg/M2 on November 14, 2023. Status post 1 cycle. Starting from cycle #2 dose of carboplatin was reduced to an AUC of 4 and Alimta 400 mg/m.  Due to rash, her premedications are being adjusted.    CURRENT THERAPY: 1) Tarceva dose reduced 100 mg p.o. daily for EGFR mutation.  First dose of Tarceva on ~12/25/23. This was held during her hospitalization and will be resumed today on 01/11/24 2)She will start concurrent systemic chemotherapy with carboplatin for AUC of 5 and Alimta 500 Mg/M2 on November 14, 2023. Status post 1 cycle. Starting from cycle #2 dose of carboplatin was reduced to an AUC of 4 and Alimta 400 mg/m.  Due to rash, her premedications are being adjusted. This is currently on hold due to performance status  INTERVAL HISTORY: Rebecca Ochoa 63 y.o. female returns to the clinic today for a follow-up visit accompanied by her husband.  The patient was last seen by Dr. Arbutus Ped and myself on 12/13/2023.  In summary, the patient has had poor tolerance to treatment.  She was initially started on oral treatment with Tagrisso due to her EGFR  mutation.  However, she has been struggling with prolonged QTc and this was discontinued in February 2025 as well as her other QTc prolonging medications, such as Zofran, were also discontinued.  At her last appointment she was started on dose reduced Tarceva 100 mg p.o. daily instead which she started on 12/25/23  The patient has been struggling with tachycardia and did see cardiology outpatient on 12/08/2023.  She also struggles with anemia and received supportive care blood transfusions, the most recent being on 12/13/23 with a 1 unit due to being symptomatic.  She was also given stool cards at her last appointment due to anemia.  She was also started on Decadron premedications the day before, the day of, the day after chemotherapy.  Of note she is on a blood thinner with Eliquis due to history of submassive PE in November 2024.  The patient was in the clinic on 12/26/2023 for a weekly lab appointment.  She was tachycardic and short of breath and complaining of cough.  Therefore, she was subsequently sent to the emergency room.  Hemoglobin also was 11 a week prior to her ER visit and was found to be 8.6 on her next weekly lab draw.  She was admitted to the hospital from 2/25-01/02/24.  She was admitted and treated for symptomatic anemia and dehydration.  She received IV fluids and 2 units of blood.  She had repeat echocardiogram that showed continued systolic dysfunction and cardiology was consulted and started on a beta-blocker.  Her tachycardia was difficult to control.  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  She has a follow-up appointment with cardiology outpatient on 01/16/2024.  She states that she was called by cardiology and they would like to perform a stress test.  She has been having some increased fatigue since starting metoprolol.  He also was previously on a low-dose steroid (prednisone 5 mg) this was discontinued in the hospital due to her tachycardia.  Therefore, one of her  main concerns today is related to decreased appetite and weight loss.  She does have an appetite but when she tries to eat she is no longer hungry and can only tolerate a few bites.  She does not like the taste of protein supplemental drinks.  Many food options do not taste good to her.  She does not have any evidence of thrush.  She has previously seen a member the nutritionist team.  She has tried baking soda mouth rinses.  She has Biotene at home.  Additionally, the patient is also undergoing IV chemotherapy with carboplatin and Alimta at a reduced dose.   He had a low-grade fever on a few occasions of 100.1 Tmax but this improved with Tylenol.  She denies any signs of infection such as upper respiratory infection.  She had a cough for a few weeks even prior to her recent hospitalization and her CT angiogram did not show any infection. She reports similar dyspnea on exertion. Denies any chest pain or hemoptysis.  She denies any nausea or vomiting.  He has been having some gas pain and bloating despite taking Gas-X and not having any constipation.. She denies any rashes or skin changes.  She is here today for evaluation repeat blood work before considering undergoing cycle number #3.     MEDICAL HISTORY: Past Medical History:  Diagnosis Date   Anemia    Asthma, mild intermittent, well-controlled    GERD (gastroesophageal reflux disease)    Hx of migraines    Hyperlipidemia    Hypertension    Vitamin D deficiency     ALLERGIES:  is allergic to biaxin [clarithromycin].  MEDICATIONS:  Current Outpatient Medications  Medication Sig Dispense Refill   acetaminophen (TYLENOL) 500 MG tablet Take 1,000 mg by mouth every 8 (eight) hours as needed for mild pain.     ALPRAZolam (XANAX) 0.5 MG tablet Take 1 tablet (0.5 mg total) by mouth 3 (three) times daily as needed for anxiety. (Patient taking differently: Take 0.5 mg by mouth in the morning.) 60 tablet 0   apixaban (ELIQUIS) 5 MG TABS tablet  Take 1 tablet (5 mg total) by mouth 2 (two) times daily. 60 tablet 1   cyanocobalamin (VITAMIN B12) 500 MCG tablet Take 1 tablet (500 mcg total) by mouth daily. 100 tablet 0   dexamethasone (DECADRON) 4 MG tablet Take 1 tablet twice a day the day before, the day of, and the day after chemotherapy 60 tablet 1   empagliflozin (JARDIANCE) 10 MG TABS tablet Take 1 tablet (10 mg total) by mouth daily. 90 tablet 0   erlotinib (TARCEVA) 100 MG tablet Take 1 tablet (100 mg total) by mouth daily. Take on an empty stomach 1 hour before meals or 2 hours after 30 tablet 3   famotidine (PEPCID) 20 MG tablet Take 1 tablet (20 mg total) by mouth at bedtime. 30 tablet 2   folic acid (FOLVITE) 1 MG tablet TAKE 1 TABLET BY MOUTH EVERY DAY 90 tablet 0   HYDROcodone-acetaminophen (NORCO/VICODIN) 5-325 MG tablet Take 1 tablet by mouth every 6 (six)  hours as needed. (Patient taking differently: Take 1 tablet by mouth every 6 (six) hours as needed (for pain).) 21 tablet 0   loperamide (IMODIUM) 2 MG capsule Take 1 capsule (2 mg total) by mouth as needed for diarrhea or loose stools. 30 capsule 0   losartan (COZAAR) 25 MG tablet Take 0.5 tablets (12.5 mg total) by mouth daily. 90 tablet 0   metoprolol succinate (TOPROL XL) 100 MG 24 hr tablet Take 1 tablet (100 mg total) by mouth in the morning and at bedtime. Take with or immediately following a meal. 180 tablet 0   nystatin (MYCOSTATIN/NYSTOP) powder Apply 1 Application topically 3 (three) times daily. Apply to under breast for rash if needed. 15 g 0   potassium chloride SA (KLOR-CON M) 20 MEQ tablet Take 1 tablet (20 mEq total) by mouth daily for 14 days. 14 tablet 0   prochlorperazine (COMPAZINE) 10 MG tablet Take 1 tablet (10 mg total) by mouth every 6 (six) hours as needed for nausea or vomiting. 60 tablet 6   Simethicone (GAS-X PO) Take 2 tablets by mouth daily as needed.     temazepam (RESTORIL) 30 MG capsule Take 1 capsule (30 mg total) by mouth at bedtime as needed  for sleep. (Patient taking differently: Take 30 mg by mouth at bedtime.) 30 capsule 3   No current facility-administered medications for this visit.   Facility-Administered Medications Ordered in Other Visits  Medication Dose Route Frequency Provider Last Rate Last Admin   0.9 %  sodium chloride infusion (Manually program via Guardrails IV Fluids)  250 mL Intravenous Continuous Rebecca Ensz L, PA-C   Stopped at 12/13/23 1658   0.9 %  sodium chloride infusion   Intravenous Continuous Si Gaul, MD   Stopped at 12/13/23 1526    SURGICAL HISTORY:  Past Surgical History:  Procedure Laterality Date   BREAST SURGERY     reduction   BRONCHIAL BIOPSY  08/08/2023   Procedure: BRONCHIAL BIOPSIES;  Surgeon: Leslye Peer, MD;  Location: [facility] ENDOSCOPY;  Service: Pulmonary;;   BRONCHIAL BRUSHINGS  08/08/2023   Procedure: BRONCHIAL BRUSHINGS;  Surgeon: Leslye Peer, MD;  Location: [facility] ENDOSCOPY;  Service: Pulmonary;;   BRONCHIAL NEEDLE ASPIRATION BIOPSY  08/08/2023   Procedure: BRONCHIAL NEEDLE ASPIRATION BIOPSIES;  Surgeon: Leslye Peer, MD;  Location: [facility] ENDOSCOPY;  Service: Pulmonary;;   ENDOBRONCHIAL ULTRASOUND Bilateral 08/08/2023   Procedure: ENDOBRONCHIAL ULTRASOUND;  Surgeon: Leslye Peer, MD;  Location: [facility] ENDOSCOPY;  Service: Pulmonary;  Laterality: Bilateral;   HEMOSTASIS CONTROL  08/08/2023   Procedure: HEMOSTASIS CONTROL;  Surgeon: Leslye Peer, MD;  Location: [facility] ENDOSCOPY;  Service: Pulmonary;;   IR IMAGING GUIDED PORT INSERTION  09/01/2023   REDUCTION MAMMAPLASTY     TUBAL LIGATION      REVIEW OF SYSTEMS:   Review of Systems  Constitutional: Positive for fatigue, weight loss, decreased appetite. Negative for  chills and fever.  HENT: Positive for taste alterations.  Negative for mouth sores, nosebleeds, sore throat and trouble swallowing.   Eyes: Negative for eye problems and icterus.  Respiratory: Today for intermittent cough with exertion and shortness  of breath with exertion.  Negative hemoptysis and wheezing.   Cardiovascular: Negative for chest pain and leg swelling.  Gastrointestinal: Positive for bloating. Negative for abdominal pain, constipation, diarrhea, nausea and vomiting.  Genitourinary: Negative for bladder incontinence, difficulty urinating, dysuria, frequency and hematuria.   Musculoskeletal: Negative for back pain, gait problem, neck pain and neck stiffness.  Skin: Negative  for itching and rash.  Neurological: Negative for dizziness, extremity weakness, gait problem, headaches, light-headedness and seizures.  Hematological: Negative for adenopathy. Does not bruise/bleed easily.  Psychiatric/Behavioral: Negative for confusion, depression and sleep disturbance. The patient is not nervous/anxious.     PHYSICAL EXAMINATION:  There were no vitals taken for this visit.  ECOG PERFORMANCE STATUS: 2-3  Physical Exam  Constitutional: Oriented to person, place, and time and chronically ill-appearing female and in no distress.   HENT:  Head: Normocephalic and atraumatic.  Mouth/Throat: Oropharynx is clear and moist. No oropharyngeal exudate.  Eyes: Conjunctivae are normal. Right eye exhibits no discharge. Left eye exhibits no discharge. No scleral icterus.  Neck: Normal range of motion. Neck supple.  Cardiovascular: Tachycardic, regular rhythm, normal heart sounds and intact distal pulses.   Pulmonary/Chest: Effort normal and breath sounds normal. No respiratory distress. No wheezes. No rales.  Abdominal: Soft. Bowel sounds are normal. Exhibits no distension and no mass. There is no tenderness.  Musculoskeletal: Normal range of motion. Exhibits no edema.  Lymphadenopathy:    No cervical adenopathy.  Neurological: Alert and oriented to person, place, and time. Exhibits muscle wasting.  Examined in the wheelchair.  Skin: Skin is warm and dry. No rash noted. Not diaphoretic. No erythema. No pallor.  Psychiatric: Mood, memory and  judgment normal.  Vitals reviewed.  LABORATORY DATA: Lab Results  Component Value Date   WBC 2.6 (Ochoa) 12/31/2023   HGB 10.1 (Ochoa) 12/31/2023   HCT 32.0 (Ochoa) 12/31/2023   MCV 87.0 12/31/2023   PLT 192 12/31/2023      Chemistry      Component Value Date/Time   NA 137 01/02/2024 1105   K 4.1 01/02/2024 1105   CL 105 01/02/2024 1105   CO2 24 01/02/2024 1105   BUN 11 01/02/2024 1105   CREATININE 0.96 01/02/2024 1105   CREATININE 0.95 12/26/2023 0755   CREATININE 1.05 10/12/2022 1442      Component Value Date/Time   CALCIUM 8.8 (Ochoa) 01/02/2024 1105   ALKPHOS 103 12/26/2023 0936   AST 25 12/26/2023 0936   AST 22 12/26/2023 0755   ALT 21 12/26/2023 0936   ALT 18 12/26/2023 0755   BILITOT 1.2 12/26/2023 0936   BILITOT 0.9 12/26/2023 0755       RADIOGRAPHIC STUDIES:  ECHOCARDIOGRAM LIMITED Result Date: 12/29/2023    ECHOCARDIOGRAM LIMITED REPORT   Patient Name:   Selby Dimaggio Date of Exam: 12/29/2023 Medical Rec #:  [id number]    Height:       65.0 in Accession #:    [id number]   Weight:       129.0 lb Date of Birth:  17-Oct-1961     BSA:          1.642 m Patient Age:    63 years     BP:           127/94 mmHg Patient Gender: F            HR:           124 bpm. Exam Location:  Inpatient Procedure: Limited Echo, Cardiac Doppler, Color Doppler and Intracardiac            Opacification Agent (Both Spectral and Color Flow Doppler were            utilized during procedure). REPORT CONTAINS CRITICAL RESULT Indications:    R00.0 Tachycardia  History:        Patient has prior history of Echocardiogram examinations, most  recent 09/05/2023. Abnormal ECG, Arrythmias:Tachycardia,                 Signs/Symptoms:Dyspnea and Shortness of Breath; Risk                 Factors:Current Smoker and Dyslipidemia. Pulmonary embolus. Lung                 cancer. Chemo. ICH.  Sonographer:    Sheralyn Boatman RDCS Referring Phys: [phone] DANIEL P GOODRICH  Sonographer Comments: Technically difficult study due to  poor echo windows. Port in parasternal. IMPRESSIONS  1. Left ventricular ejection fraction, by estimation, is 30 to 35%. The left ventricle has severely decreased function. The left ventricle demonstrates regional wall motion abnormalities with akinesis of the basal to mid anteroseptal, inferoseptal, and inferior walls. Akinesis of the basal anterior wall. The function of apical segments is relatively well-preserved. This picture of wall motion abnormalities would be unusual with coronary disease and could be consistent with a "reverse Takotsubo" stress cardiomyopathy.  2. Right ventricular systolic function is normal. The right ventricular size is normal.  3. The inferior vena cava is normal in size with greater than 50% respiratory variability, suggesting right atrial pressure of 3 mmHg.  4. Limited echo. FINDINGS  Left Ventricle: Left ventricular ejection fraction, by estimation, is 30 to 35%. The left ventricle has moderately decreased function. The left ventricle demonstrates regional wall motion abnormalities. Definity contrast agent was given IV to delineate the left ventricular endocardial borders. The left ventricular internal cavity size was normal in size. There is no left ventricular hypertrophy. Right Ventricle: The right ventricular size is normal. No increase in right ventricular wall thickness. Right ventricular systolic function is normal. Pericardium: There is no evidence of pericardial effusion. Venous: The inferior vena cava is normal in size with greater than 50% respiratory variability, suggesting right atrial pressure of 3 mmHg. Additional Comments: Spectral Doppler performed. Color Doppler performed.  LEFT VENTRICLE PLAX 2D LVIDd:         4.60 cm LVIDs:         4.00 cm LV PW:         1.00 cm LV IVS:        1.10 cm  LV Volumes (MOD) LV vol d, MOD A2C: 70.8 ml LV vol d, MOD A4C: 107.0 ml LV vol s, MOD A2C: 61.4 ml LV vol s, MOD A4C: 62.4 ml LV SV MOD A2C:     9.4 ml LV SV MOD A4C:     107.0 ml  LV SV MOD BP:      35.4 ml IVC IVC diam: 1.30 cm Dalton McleanMD Electronically signed by [name] Signature Date/Time: 12/29/2023/3:26:14 PM    Final    CT ABDOMEN PELVIS WO CONTRAST Result Date: 12/26/2023 CLINICAL DATA:  History of non-small cell lung cancer, abdominal pain, anemia EXAM: CT ABDOMEN AND PELVIS WITHOUT CONTRAST TECHNIQUE: Multidetector CT imaging of the abdomen and pelvis was performed following the standard protocol without IV contrast. RADIATION DOSE REDUCTION: This exam was performed according to the departmental dose-optimization program which includes automated exposure control, adjustment of the mA and/or kV according to patient size and/or use of iterative reconstruction technique. COMPARISON:  11/09/2023, 12/26/2023 FINDINGS: Lower chest: Multiple pulmonary nodules and right hilar mass are seen consistent with known metastatic lung cancer. Please see CT chest performed earlier today. Hepatobiliary: Numerous hypodense masses are seen throughout the liver, demonstrating decreased size and central necrosis since prior study. Index lesion in the left  lobe liver measures 6.0 x 3.5 cm reference image 10/2, previously 8.3 x 4.5 cm. No evidence of cholelithiasis or cholecystitis. No evidence of biliary duct dilation. Pancreas: Unremarkable. No pancreatic ductal dilatation or surrounding inflammatory changes. Spleen: Normal in size without focal abnormality. The hypodense lesions seen on prior study, and visualized on earlier CT chest performed today, are not well visualized on this exam likely due to phase of contrast enhancement. Adrenals/Urinary Tract: Numerous indeterminate bilateral renal hypodensities are again noted. Index lesion in the interpolar right kidney reference image 17/2 measures 1.7 cm, previously measuring 1.8 cm. No hydronephrosis or nephrolithiasis. The adrenals are unremarkable. Excreted contrast within the urinary bladder with no filling defect identified.  Stomach/Bowel: No bowel obstruction or ileus. Normal appendix right lower quadrant. Diverticulosis of the sigmoid colon without evidence of acute diverticulitis. Vascular/Lymphatic: Stable aortic atherosclerosis. There is persistent adenopathy at the porta hepatis, with decreased areas of necrosis and cavitation since prior study. Index lymph node on image 16/2 measures 2.4 cm in short axis, unchanged by my measurements since prior. No new adenopathy identified. Reproductive: Uterus and bilateral adnexa are unremarkable. Other: No free fluid or free intraperitoneal gas. No abdominal wall hernia. Musculoskeletal: No acute or destructive bony abnormalities. Reconstructed images demonstrate no additional findings. IMPRESSION: 1. Right hilar mass and numerous bilateral pulmonary nodules consistent with known history of metastatic lung cancer. Please see chest CT performed earlier today. 2. Numerous hypodense masses within the liver, demonstrating decrease in size and resolution of central necrosis/cavitation on prior study. 3. Continued portacaval adenopathy, without significant change in size of lymph nodes. The central cavitation/necrosis has resolved. 4. Numerous bilateral renal hypodensities, too small to characterize, but grossly stable since prior exam. Metastatic disease remains the diagnosis of exclusion. 5. The splenic hypodensity seen on previous study and visualized on earlier chest CT today are not well visualized on this exam, likely due to delayed imaging relative to contrast administration performed earlier for chest CT. 6. Sigmoid diverticulosis without diverticulitis. 7. Aortic Atherosclerosis (ICD10-I70.0). Electronically Signed   By: Sharlet Salina M.D.   On: 12/26/2023 18:40   CT Angio Chest PE W and/or Wo Contrast Result Date: 12/26/2023 CLINICAL DATA:  Concern for pulmonary embolism. History of non-small cell lung cancer. EXAM: CT ANGIOGRAPHY CHEST WITH CONTRAST TECHNIQUE: Multidetector CT  imaging of the chest was performed using the standard protocol during bolus administration of intravenous contrast. Multiplanar CT image reconstructions and MIPs were obtained to evaluate the vascular anatomy. RADIATION DOSE REDUCTION: This exam was performed according to the departmental dose-optimization program which includes automated exposure control, adjustment of the mA and/or kV according to patient size and/or use of iterative reconstruction technique. CONTRAST:  75mL OMNIPAQUE IOHEXOL 350 MG/ML SOLN COMPARISON:  CT dated 11/09/2023. FINDINGS: Cardiovascular: There is no cardiomegaly or pericardial effusion. Mild atherosclerotic calcification of the thoracic aorta. No aneurysmal dilatation or dissection. The origins of the great vessels of the aortic arch appear patent. Left-sided Port-A-Cath with tip at the cavoatrial junction. No pulmonary artery embolus identified. Mediastinum/Nodes: Right hilar mass encasing the right pulmonary artery and right upper and middle lobe bronchi relatively similar to prior CT. The esophagus is grossly unremarkable. No mediastinal fluid collection. Lungs/Pleura: Multiple bilateral pulmonary nodules similar or slightly decreased since the prior CT consistent with metastatic disease. No consolidative changes. Trace right pleural effusion. No pneumothorax. The central airways are patent. Upper Abdomen: Retroperitoneal adenopathy and left liver masses. Musculoskeletal: Osteopenia with degenerative changes. No acute osseous pathology. Review of the MIP images  confirms the above findings. IMPRESSION: 1. No CT evidence of pulmonary artery embolus. 2. Right hilar mass and pulmonary metastasis similar or slightly decreased in size since the prior CT. 3. Retroperitoneal adenopathy and left liver masses. 4.  Aortic Atherosclerosis (ICD10-I70.0). Electronically Signed   By: Elgie Collard M.D.   On: 12/26/2023 14:18   DG Chest 1 View Result Date: 12/26/2023 CLINICAL DATA:  Chest  pain, cough, and weakness. EXAM: CHEST  1 VIEW COMPARISON:  11/09/2023. FINDINGS: The heart size and mediastinal contours are within normal limits. There is atherosclerotic calcification of the aorta scattered opacities with right upper lobe and right hilar mass are noted in the lungs bilaterally, compatible with known pulmonary nodules and masses. No effusion or pneumothorax is seen. A left chest port is stable in position. No acute osseous abnormality. IMPRESSION: Scattered opacities in the lungs bilaterally with right upper lobe and right hilar masslike consolidation, increased from the prior exam. Electronically Signed   By: Thornell Sartorius M.D.   On: 12/26/2023 10:46     ASSESSMENT/PLAN:  This is a very pleasant 63 year old Caucasian female with stage IV (T3, N2, M1 C) non-small cell lung cancer, poorly differentiated squamous cell carcinoma.  She presented with a large right upper lobe lung mass in addition to large necrotic right hilar and mediastinal mass and innumerable hypermetabolic pulmonary metastatic lesions in addition to liver and bone metastasis.  She also has abdominal celiac axis lymphadenopathy.  She was diagnosed in October 2024.  Her molecular studies show that she is negative for PD-L1 expression but positive for EGFR mutation G719A.   She underwent palliative radiation to the large hilar and mediastinal mass under the care of Dr. Roselind Messier.  The last dose was on 11//24.   She was started on Tagrisso 80 mg p.o. daily starting on 09/29/2023.  She is found to have prolonged QT at her office visit on 12/06/2023 and this has been on hold for about 1 week. This was discontinued 12/13/23 due to continued critical prolonged QTC.   She was started on tarceva instead at reduced dose of 100 mg. She started this on 12/25/23.   She is also on chemotherapy with carboplatin for an AUC of 5 and Alimta 500 mg/m which was started on 11/21/2023.  She status post 2 cycles.  She has been having some  cytopenias, fatigue, decreased appetite with treatment which has been causing dose delays. Her chemo doses were reduced starting from cycle #2 to carboplatin for an AUC of 4 and Alimta 400 mg/m2.   She was recently discharged from the hospital. Cycle #3 has been delayed by 1 weeks.   Labs were reviewed.  Patient was seen with Dr. Arbutus Ped today.  We will continue to hold her chemotherapy due to her fatigue and weakness.  Dr. Arbutus Ped had a goals of care discussion with the patient.  Should her heart ever stop the patient would like to be full code.  The patient's cardiologist reached out to Dr. Arbutus Ped yesterday to discuss if there is any alternative options.  Unfortunately the Tarceva is the alternative option to Tagrisso.  Her Edgar Frisk has been on hold since she was hospitalized from 2/25-3//25.  Dr. Arbutus Ped is concerned about her not being on any treatment.  He recommended that she start her Tarceva back.  But we will continue to hold her IV chemotherapy at this time until she has improvement in her overall condition.  The patient is expected to follow with her cardiologist early next week  on 01/16/2024.  They would like to perform a stress test.  We talked about small frequent meals, salt water rinses, Biotene, and sucking on lemon heads.  We also discussed avoiding gas producing food.  We talked about protein supplemental drinks.  She is likely not a good candidate for Megace for appetite stimulant due to her PE.  She is not a good candidate for Marinol due to her weakness and fatigue.  She is not a good candidate for Remeron due to her history of critical long QTc.  She is already previously seen a member the nutritionist team.  We will see her back for labs and follow-up visit in 3 weeks for considering resuming treatment with cycle #3  We will see her back for labs and follow up visit in 3 weeks before undergoing cycle #4.   We will consider her for a blood transfusion if her Hbg is <8.  She has  standing orders for sample of blood bank due to her history of anemia.   The patient was advised to call immediately if she has any concerning symptoms in the interval. The patient voices understanding of current disease status and treatment options and is in agreement with the current care plan. All questions were answered. The patient knows to call the clinic with any problems, questions or concerns. We can certainly see the patient much sooner if necessary   No orders of the defined types were placed in this encounter.     Dionisios Ricci Ochoa Ramy Greth, PA-C 01/05/24  ADDENDUM: Hematology/Oncology Attending:  I had a face-to-face encounter with the patient today.  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  She is status post palliative radiotherapy to the large hilar and mediastinal masses then the patient started treatment with Tagrisso 80 mg p.o. daily discontinued after 2 months secondary to QTc prolongation.  She was then started on treatment with Tarceva 100 mg p.o. daily on December 25, 2023 but this has been on hold after few days of treatment secondary to hospitalization with significant tachycardia and suspicious pneumonia.  She also started on concurrent systemic chemotherapy with carboplatin and Alimta status post 2 cycles and her treatment has also been on hold because of her cardiac condition. The patient presented today for evaluation and she continues to complain of increasing fatigue and weakness.  She has symptomatic anemia and she received PRBCs transfusion.  She is currently on Eliquis for pulmonary embolism diagnosed in November 2024.  She has been followed by Dr. Wyline Mood from cardiology regarding her tachycardia and QT prolongation.  She started treatment with a beta-blocker, metoprolol during her  hospitalization.  She has an appointment with Dr. Wyline Mood with cardiology next week. I had a lengthy discussion with the patient and her boyfriend today about her current condition and treatment options.  The patient is still interested in proceeding with treatment and she declined palliative care.  She also wanted to stay full code for now. I recommended for her to resume her treatment with Tarceva 100 mg p.o. daily.  We may consider adding the systemic chemotherapy in the future once her cardiac condition improves We will see her back for follow-up visit in 3 weeks for evaluation She was advised to call immediately if she has any other concerning symptoms in the interval. The total time spent  in the appointment was 30 minutes. Disclaimer: This note was dictated with voice recognition software. Similar sounding words can inadvertently be transcribed and may be missed upon review. Lajuana Matte, MD

## 2024-01-05 NOTE — Telephone Encounter (Signed)
 Patient identification verified by 2 forms. Rebecca Rail, RN    Called and spoke to patient  Relayed message below  Patient verbalized understanding, no questions at this time

## 2024-01-05 NOTE — Telephone Encounter (Signed)
 Patient was returning call. Please advise ?

## 2024-01-05 NOTE — Telephone Encounter (Signed)
 Rescheduled appointments per patient getting out of the hospital. The patient is aware of the appointments made.

## 2024-01-05 NOTE — Telephone Encounter (Signed)
 [name], MD  You; Freddi Starr, RN21 minutes ago (10:10 AM)   MB She can take her meds and continue to monitor.  If her blood pressure is too low (less than 90 systolic), then she should not take her blood pressure medications that day. She was just discharged, she must have tolerated her meds in the hospital. She has an appointment on 3/18   Attempted to call patient, no answer left message requesting a call back.

## 2024-01-09 ENCOUNTER — Ambulatory Visit (HOSPITAL_COMMUNITY)
Admit: 2024-01-09 | Discharge: 2024-01-09 | Disposition: A | Discharge status: Home / Self Care | Attending: Physician Assistant | Admitting: Physician Assistant

## 2024-01-09 ENCOUNTER — Inpatient Hospital Stay: Payer: No Typology Code available for payment source

## 2024-01-09 DIAGNOSIS — I428 Other cardiomyopathies: Secondary | ICD-10-CM | POA: Diagnosis present

## 2024-01-09 DIAGNOSIS — R0600 Dyspnea, unspecified: Secondary | ICD-10-CM | POA: Diagnosis not present

## 2024-01-09 DIAGNOSIS — I3139 Other pericardial effusion (noninflammatory): Secondary | ICD-10-CM | POA: Insufficient documentation

## 2024-01-09 DIAGNOSIS — E785 Hyperlipidemia, unspecified: Secondary | ICD-10-CM | POA: Insufficient documentation

## 2024-01-09 DIAGNOSIS — I10 Essential (primary) hypertension: Secondary | ICD-10-CM | POA: Diagnosis not present

## 2024-01-09 LAB — ECHOCARDIOGRAM LIMITED
Calc EF: 32.8 %
S' Lateral: 2.9 cm
Single Plane A2C EF: 37.2 %
Single Plane A4C EF: 33.7 %

## 2024-01-10 MED FILL — Fosaprepitant Dimeglumine For IV Infusion 150 MG (Base Eq): INTRAVENOUS | Qty: 5 | Status: AC

## 2024-01-10 NOTE — Telephone Encounter (Signed)
 I called Rebecca Ochoa and discussed with her that she still has moderately reduced LV function.  Will plan for a Lexiscan.  Informed Consent   Shared Decision Making/Informed Consent The risks [chest pain, shortness of breath, cardiac arrhythmias, dizziness, blood pressure fluctuations, myocardial infarction, stroke/transient ischemic attack, nausea, vomiting, allergic reaction, radiation exposure, metallic taste sensation and life-threatening complications (estimated to be 1 in 10,000)], benefits (risk stratification, diagnosing coronary artery disease, treatment guidance) and alternatives of a nuclear stress test were discussed in detail with Rebecca Ochoa and she agrees to proceed.

## 2024-01-11 ENCOUNTER — Other Ambulatory Visit: Payer: Self-pay

## 2024-01-11 ENCOUNTER — Inpatient Hospital Stay: Attending: Internal Medicine | Admitting: Physician Assistant

## 2024-01-11 ENCOUNTER — Inpatient Hospital Stay

## 2024-01-11 ENCOUNTER — Telehealth: Payer: Self-pay | Admitting: *Deleted

## 2024-01-11 ENCOUNTER — Other Ambulatory Visit: Payer: Self-pay | Admitting: *Deleted

## 2024-01-11 VITALS — BP 112/75 | HR 123 | Temp 97.5°F | Resp 19 | Ht 65.0 in | Wt 129.9 lb

## 2024-01-11 DIAGNOSIS — J358 Other chronic diseases of tonsils and adenoids: Secondary | ICD-10-CM

## 2024-01-11 DIAGNOSIS — Z7189 Other specified counseling: Secondary | ICD-10-CM | POA: Diagnosis not present

## 2024-01-11 DIAGNOSIS — Z95828 Presence of other vascular implants and grafts: Secondary | ICD-10-CM

## 2024-01-11 DIAGNOSIS — Z79899 Other long term (current) drug therapy: Secondary | ICD-10-CM | POA: Insufficient documentation

## 2024-01-11 DIAGNOSIS — C3411 Malignant neoplasm of upper lobe, right bronchus or lung: Secondary | ICD-10-CM

## 2024-01-11 DIAGNOSIS — I428 Other cardiomyopathies: Secondary | ICD-10-CM

## 2024-01-11 DIAGNOSIS — Z923 Personal history of irradiation: Secondary | ICD-10-CM | POA: Insufficient documentation

## 2024-01-11 DIAGNOSIS — C787 Secondary malignant neoplasm of liver and intrahepatic bile duct: Secondary | ICD-10-CM | POA: Insufficient documentation

## 2024-01-11 DIAGNOSIS — D649 Anemia, unspecified: Secondary | ICD-10-CM | POA: Diagnosis not present

## 2024-01-11 DIAGNOSIS — C7951 Secondary malignant neoplasm of bone: Secondary | ICD-10-CM | POA: Insufficient documentation

## 2024-01-11 LAB — CMP (CANCER CENTER ONLY)
ALT: 20 U/L (ref 0–44)
AST: 44 U/L — ABNORMAL HIGH (ref 15–41)
Albumin: 3.1 g/dL — ABNORMAL LOW (ref 3.5–5.0)
Alkaline Phosphatase: 144 U/L — ABNORMAL HIGH (ref 38–126)
Anion gap: 5 (ref 5–15)
BUN: 11 mg/dL (ref 8–23)
CO2: 29 mmol/L (ref 22–32)
Calcium: 8.9 mg/dL (ref 8.9–10.3)
Chloride: 103 mmol/L (ref 98–111)
Creatinine: 0.86 mg/dL (ref 0.44–1.00)
GFR, Estimated: >60 mL/min (ref >60)
Glucose, Bld: 107 mg/dL — ABNORMAL HIGH (ref 70–99)
Potassium: 4.1 mmol/L (ref 3.5–5.1)
Sodium: 137 mmol/L (ref 135–145)
Total Bilirubin: 0.7 mg/dL (ref 0.0–1.2)
Total Protein: 7.4 g/dL (ref 6.5–8.1)

## 2024-01-11 LAB — CBC WITH DIFFERENTIAL (CANCER CENTER ONLY)
Abs Immature Granulocytes: 0.07 10*3/uL (ref 0.00–0.07)
Basophils Absolute: 0 10*3/uL (ref 0.0–0.1)
Basophils Relative: 0 %
Eosinophils Absolute: 0.1 10*3/uL (ref 0.0–0.5)
Eosinophils Relative: 1 %
HCT: 31.4 % — ABNORMAL LOW (ref 36.0–46.0)
Hemoglobin: 10 g/dL — ABNORMAL LOW (ref 12.0–15.0)
Immature Granulocytes: 1 %
Lymphocytes Relative: 7 %
Lymphs Abs: 0.7 10*3/uL (ref 0.7–4.0)
MCH: 27.2 pg (ref 26.0–34.0)
MCHC: 31.8 g/dL (ref 30.0–36.0)
MCV: 85.6 fL (ref 80.0–100.0)
Monocytes Absolute: 1.2 10*3/uL — ABNORMAL HIGH (ref 0.1–1.0)
Monocytes Relative: 12 %
Neutro Abs: 8 10*3/uL — ABNORMAL HIGH (ref 1.7–7.7)
Neutrophils Relative %: 79 %
Platelet Count: 385 10*3/uL (ref 150–400)
RBC: 3.67 MIL/uL — ABNORMAL LOW (ref 3.87–5.11)
RDW: 17.4 % — ABNORMAL HIGH (ref 11.5–15.5)
WBC Count: 10.1 10*3/uL (ref 4.0–10.5)
nRBC: 0 % (ref 0.0–0.2)

## 2024-01-11 LAB — SAMPLE TO BLOOD BANK

## 2024-01-11 MED ORDER — SODIUM CHLORIDE 0.9% FLUSH
10.0000 mL | Freq: Once | INTRAVENOUS | Status: AC
  Administered 2024-01-11: 10 mL

## 2024-01-11 MED ORDER — HEPARIN SOD (PORK) LOCK FLUSH 100 UNIT/ML IV SOLN
500.0000 [IU] | Freq: Once | INTRAVENOUS | Status: AC
  Administered 2024-01-11: 500 [IU]

## 2024-01-12 ENCOUNTER — Inpatient Hospital Stay: Admitting: Licensed Clinical Social Worker

## 2024-01-12 DIAGNOSIS — C3411 Malignant neoplasm of upper lobe, right bronchus or lung: Secondary | ICD-10-CM

## 2024-01-12 NOTE — Progress Notes (Signed)
 [facility] CSW Progress Note  Clinical Child psychotherapist contacted patient by phone to discuss advanced directives.  Pt signed up for advanced directives clinic on 02/12/2024.  CSW to remain available as appropriate throughout duration of treatment.      Rachel Moulds, LCSW Clinical Social Worker [facility]

## 2024-01-12 NOTE — Telephone Encounter (Signed)
Signed consent

## 2024-01-15 ENCOUNTER — Telehealth: Payer: Self-pay | Admitting: Internal Medicine

## 2024-01-15 NOTE — Telephone Encounter (Signed)
 Patient identification verified by 2 forms. Shade Flood, RN     Called and spoke to patient  Patient states:  - appointment tomorrow is for [facility].  - Has stress test scheduled pending scheduling and not sure if she should reschedule for after stress test.                Interventions/Plan: - forwarding to primary cardiologist for review.   Patient agrees with plan, no questions at this time

## 2024-01-15 NOTE — Telephone Encounter (Signed)
 Patient calling to see if she needs to r/s her appt for tomorrow till after her stress test. Please advise

## 2024-01-16 ENCOUNTER — Ambulatory Visit: Payer: No Typology Code available for payment source | Admitting: Internal Medicine

## 2024-01-16 ENCOUNTER — Ambulatory Visit: Admitting: Internal Medicine

## 2024-01-16 ENCOUNTER — Ambulatory Visit: Payer: No Typology Code available for payment source

## 2024-01-16 ENCOUNTER — Other Ambulatory Visit: Payer: No Typology Code available for payment source

## 2024-01-17 ENCOUNTER — Encounter (HOSPITAL_COMMUNITY)

## 2024-01-19 ENCOUNTER — Inpatient Hospital Stay

## 2024-01-19 ENCOUNTER — Other Ambulatory Visit: Payer: Self-pay | Admitting: Physician Assistant

## 2024-01-19 ENCOUNTER — Inpatient Hospital Stay: Admitting: Physician Assistant

## 2024-01-19 ENCOUNTER — Other Ambulatory Visit: Payer: Self-pay | Admitting: Medical Oncology

## 2024-01-19 ENCOUNTER — Telehealth: Payer: Self-pay | Admitting: Medical Oncology

## 2024-01-19 VITALS — BP 111/77 | HR 101 | Temp 97.7°F | Resp 18 | Wt 126.4 lb

## 2024-01-19 VITALS — BP 114/70 | HR 99 | Resp 18

## 2024-01-19 DIAGNOSIS — R638 Other symptoms and signs concerning food and fluid intake: Secondary | ICD-10-CM

## 2024-01-19 DIAGNOSIS — C3411 Malignant neoplasm of upper lobe, right bronchus or lung: Secondary | ICD-10-CM

## 2024-01-19 DIAGNOSIS — R110 Nausea: Secondary | ICD-10-CM

## 2024-01-19 DIAGNOSIS — R58 Hemorrhage, not elsewhere classified: Secondary | ICD-10-CM | POA: Diagnosis not present

## 2024-01-19 DIAGNOSIS — C3491 Malignant neoplasm of unspecified part of right bronchus or lung: Secondary | ICD-10-CM | POA: Diagnosis not present

## 2024-01-19 DIAGNOSIS — J358 Other chronic diseases of tonsils and adenoids: Secondary | ICD-10-CM

## 2024-01-19 DIAGNOSIS — Z95828 Presence of other vascular implants and grafts: Secondary | ICD-10-CM

## 2024-01-19 DIAGNOSIS — C099 Malignant neoplasm of tonsil, unspecified: Secondary | ICD-10-CM | POA: Diagnosis not present

## 2024-01-19 LAB — CBC WITH DIFFERENTIAL (CANCER CENTER ONLY)
Abs Immature Granulocytes: 0.07 10*3/uL (ref 0.00–0.07)
Basophils Absolute: 0 10*3/uL (ref 0.0–0.1)
Basophils Relative: 0 %
Eosinophils Absolute: 0.5 10*3/uL (ref 0.0–0.5)
Eosinophils Relative: 4 %
HCT: 28.5 % — ABNORMAL LOW (ref 36.0–46.0)
Hemoglobin: 9.2 g/dL — ABNORMAL LOW (ref 12.0–15.0)
Immature Granulocytes: 1 %
Lymphocytes Relative: 9 %
Lymphs Abs: 1 10*3/uL (ref 0.7–4.0)
MCH: 27.4 pg (ref 26.0–34.0)
MCHC: 32.3 g/dL (ref 30.0–36.0)
MCV: 84.8 fL (ref 80.0–100.0)
Monocytes Absolute: 0.8 10*3/uL (ref 0.1–1.0)
Monocytes Relative: 8 %
Neutro Abs: 8.2 10*3/uL — ABNORMAL HIGH (ref 1.7–7.7)
Neutrophils Relative %: 78 %
Platelet Count: 373 10*3/uL (ref 150–400)
RBC: 3.36 MIL/uL — ABNORMAL LOW (ref 3.87–5.11)
RDW: 18.7 % — ABNORMAL HIGH (ref 11.5–15.5)
WBC Count: 10.6 10*3/uL — ABNORMAL HIGH (ref 4.0–10.5)
nRBC: 0 % (ref 0.0–0.2)

## 2024-01-19 LAB — CMP (CANCER CENTER ONLY)
ALT: 28 U/L (ref 0–44)
AST: 60 U/L — ABNORMAL HIGH (ref 15–41)
Albumin: 3.2 g/dL — ABNORMAL LOW (ref 3.5–5.0)
Alkaline Phosphatase: 133 U/L — ABNORMAL HIGH (ref 38–126)
Anion gap: 11 (ref 5–15)
BUN: 14 mg/dL (ref 8–23)
CO2: 27 mmol/L (ref 22–32)
Calcium: 9.6 mg/dL (ref 8.9–10.3)
Chloride: 98 mmol/L (ref 98–111)
Creatinine: 0.79 mg/dL (ref 0.44–1.00)
GFR, Estimated: >60 mL/min (ref >60)
Glucose, Bld: 96 mg/dL (ref 70–99)
Potassium: 3.4 mmol/L — ABNORMAL LOW (ref 3.5–5.1)
Sodium: 136 mmol/L (ref 135–145)
Total Bilirubin: 1.1 mg/dL (ref 0.0–1.2)
Total Protein: 7.7 g/dL (ref 6.5–8.1)

## 2024-01-19 LAB — SAMPLE TO BLOOD BANK

## 2024-01-19 LAB — MAGNESIUM: Magnesium: 1.5 mg/dL — ABNORMAL LOW (ref 1.7–2.4)

## 2024-01-19 MED ORDER — SODIUM CHLORIDE 0.9 % IV SOLN: Freq: Once | INTRAVENOUS | Status: AC

## 2024-01-19 MED ORDER — MAGNESIUM SULFATE 2 GM/50ML IV SOLN
2.0000 g | Freq: Once | INTRAVENOUS | Status: AC
  Administered 2024-01-19: 2 g via INTRAVENOUS
  Filled 2024-01-19: qty 50

## 2024-01-19 MED ORDER — SODIUM CHLORIDE 0.9% FLUSH
10.0000 mL | Freq: Once | INTRAVENOUS | Status: AC
Start: 2024-01-19 — End: 2024-01-19
  Administered 2024-01-19: 10 mL

## 2024-01-19 MED ORDER — HEPARIN SOD (PORK) LOCK FLUSH 100 UNIT/ML IV SOLN
500.0000 [IU] | Freq: Once | INTRAVENOUS | Status: AC
  Administered 2024-01-19: 500 [IU]

## 2024-01-19 MED ORDER — PROCHLORPERAZINE EDISYLATE 10 MG/2ML IJ SOLN
10.0000 mg | Freq: Once | INTRAMUSCULAR | Status: AC
  Administered 2024-01-19: 10 mg via INTRAVENOUS
  Filled 2024-01-19: qty 2

## 2024-01-19 NOTE — Telephone Encounter (Signed)
 Pt instructed to come now for labs and to see [facility]. She voiced understanding . She said it may take her 30 mins because she is staying with her boyfriend now which is farther than [employer]. Lab orders entered.

## 2024-01-19 NOTE — Telephone Encounter (Signed)
 Symptoms- Weak, persistent nausea ,not eating ,drank only 6 oz since 4 am. Takes only a few bites of food then starts gagging. Feels dehydrated,  Compazine not controlling her nausea . She was told to stop zofran due to her heart . Staying in recliner , and is very weak after she returns from bathroom to recliner. Asking for labs and to see provider and possible IVF.   C2 carbo/alimta 02/12

## 2024-01-19 NOTE — Patient Instructions (Signed)
Nausea, Adult Nausea is the feeling of having an upset stomach or that you are about to vomit. Nausea on its own is not usually a serious concern, but it may be an early sign of a more serious medical problem. As nausea gets worse, it can lead to vomiting. If vomiting develops, or if you are not able to drink enough fluids, you are at risk of becoming dehydrated. Dehydration can make you tired and thirsty, cause you to have a dry mouth, and decrease how often you urinate. Older adults and people with other diseases or a weak disease-fighting system (immune system) are at higher risk for dehydration. The main goals of treating your nausea are: To relieve your nausea. To limit repeated nausea episodes. To prevent vomiting and dehydration. Follow these instructions at home: Watch your symptoms for any changes. Tell your health care provider about them. Eating and drinking     Take an oral rehydration solution (ORS). This is a drink that is sold at pharmacies and retail stores. Drink clear fluids slowly and in small amounts as you are able. Clear fluids include water, ice chips, low-calorie sports drinks, and fruit juice that has water added (diluted fruit juice). Eat bland, easy-to-digest foods in small amounts as you are able. These foods include bananas, applesauce, rice, lean meats, toast, and crackers. Avoid drinking fluids that contain a lot of sugar or caffeine, such as energy drinks, sports drinks, and soda. Avoid alcohol. Avoid spicy or fatty foods. General instructions Take over-the-counter and prescription medicines only as told by your health care provider. Rest at home while you recover. Drink enough fluid to keep your urine pale yellow. Breathe slowly and deeply when you feel nauseous. Avoid smelling things that have strong odors. Wash your hands often using soap and water for at least 20 seconds. If soap and water are not available, use hand sanitizer. Make sure that everyone in  your household washes their hands well and often. Keep all follow-up visits. This is important. Contact a health care provider if: Your nausea gets worse. Your nausea does not go away after two days. You vomit multiple times. You cannot drink fluids without vomiting. You have any of the following: New symptoms. A fever. A headache. Muscle cramps. A rash. Pain while urinating. You feel light-headed or dizzy. Get help right away if: You have pain in your chest, neck, arm, or jaw. You feel extremely weak or you faint. You have vomit that is bright red or looks like coffee grounds. You have bloody or black stools (feces) or stools that look like tar. You have a severe headache, a stiff neck, or both. You have severe pain, cramping, or bloating in your abdomen. You have difficulty breathing or are breathing very quickly. Your heart is beating very quickly. Your skin feels cold and clammy. You feel confused. You have signs of dehydration, such as: Dark urine, very little urine, or no urine. Cracked lips. Dry mouth. Sunken eyes. Sleepiness. Weakness. These symptoms may be an emergency. Get help right away. Call 911. Do not wait to see if the symptoms will go away. Do not drive yourself to the hospital. Summary Nausea is the feeling that you have an upset stomach or that you are about to vomit. Nausea on its own is not usually a serious concern, but it may be an early sign of a more serious medical problem. If vomiting develops, or if you are not able to drink enough fluids, you are at risk of becoming   dehydrated. Follow recommendations for eating and drinking and take over-the-counter and prescription medicines only as told by your health care provider. Contact a health care provider right away if your symptoms worsen or you have new symptoms. Keep all follow-up visits. This is important. This information is not intended to replace advice given to you by your health care provider.  Make sure you discuss any questions you have with your health care provider. Document Revised: 04/23/2021 Document Reviewed: 04/23/2021 Elsevier Patient Education  2024 [employer].

## 2024-01-19 NOTE — Progress Notes (Signed)
 Symptom Management Consult Note Rebecca [facility]    Patient Care Team: Patient, No Pcp Per as PCP - General (General Practice) Branch, Alben Spittle, MD as PCP - Cardiology (Cardiology) Hanley Seamen Dustin Folks, MD as Referring Physician (Optometry) Ilda Mori, MD as Consulting Physician (Obstetrics and Gynecology) Arminda Resides, MD as Consulting Physician (Dermatology) Lytle Butte, RN as Registered Nurse [name], MD as Referring Physician (Internal Medicine) Pickenpack-Cousar, Arty Baumgartner, NP as Nurse Practitioner [facility] and [facility])    Name / MRN / DOB: Rebecca Ochoa  [id number]  11-22-1960   Date of visit: 01/19/2024   Chief Complaint/Reason for visit: fatigue, decreased PO intake   Current Therapy: Tarceva with concurrent systemic chemotherapy with carboplatin for AUC of 5 and Alimta 500 Mg/M2   Last treatment:  Day 1   Cycle 2 on 12/13/23  Assessment & Plan  Patient is a 63 y.o. female with oncologic history of Stage IVB ( T3, N2, M1c) non-Small Cell Lung Cancer, poorly differentiated squamous cell carcinoma followed by Dr. Arbutus Ped.  I have viewed most recent oncology note and lab work.  #Stage IVB ( T3, N2, M1c) non-Small Cell Lung Cancer, poorly differentiated squamous cell carcinoma  - Undergoing Tarceva treatment with side effects impacting quality of life. Evaluated potential need to hold Tarceva to assess symptom causation. She will hold treatment over the weekend. - Will discuss with oncologist regarding side effect management and treatment on Monday.  #Fatigue and anorexia - Fatigue likely due to low magnesium and inadequate nutrition. Taste changes possibly linked to Tarceva. Administering IV magnesium to alleviate fatigue.Magnesium today is 1.5. Potassium is stable at 3.4. Hemoglobin stable at 9.2. - Consult nutritionist for dietary options and monitor nutritional status.  #Nausea - Administering IV Compazine today in clinic. -  Limited options with antiemetics because of comorbidities. Discussed taking compazine consistently q6 hours in attempt to better control nausea.  #Tonsil mass Large left tonsil with suspected tumor, primary focus on lung cancer management. Contacting oncologist for further evaluation needs. - Will also discuss tonsil mass with oncologist as there is concern for progression and potential ENT reevaluation.     Strict ED precautions discussed should symptoms worsen.   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)   08/24/2023 Cancer Staging   Staging form: Lung, AJCC 8th Edition - Clinical: Stage IVB (cT3, cN2, cM1c) - Signed by Si Gaul, MD on 08/24/2023   09/12/2023 - 09/12/2023 Chemotherapy   Patient is on Treatment Plan : LUNG NSCLC Carboplatin (6) + Paclitaxel (200) + Pembrolizumab (200) D1 q21d x 4 cycles / Pembrolizumab (200) Maintenance D1 q21d     11/21/2023 -  Chemotherapy   Patient is on Treatment Plan : LUNG NSCLC Pemetrexed + Carboplatin q21d x 4 Cycles         Interval history-: Discussed the use of AI scribe software for clinical note transcription with the patient, who gave verbal consent to proceed.   Rebecca Ochoa is a 63 y.o. female with oncologic history as above presenting to [facility] today with chief complaint of fatigue and decreased PO intake. Patient is accompanied by family member who provides additional history.  History of Present Illness She has been experiencing weakness and altered taste, describing her taste buds as having 'gone completely crazy.' Symptoms worsened in the last 2 weeks.  She  can still taste a piece of food she ate yesterday, and these changes have worsened over the past two to three weeks, leading to reduced food and fluid intake.  She is not consuming many fluids and suspects  dehydration, as she did not need to use the restroom in this morning as she usually does.  No diarrhea, but she is unsure about the consistency of her bowel movements, which occur once or twice a day. She is taking Prochlorperazine for nausea, as she cannot take Zofran due to prolonged QTc, but notes that Compazine does not help with her nausea.  No fevers or bleeding. She reports a sore throat but denies any white coating or sores in her mouth. She experiences occasional nasal dryness with clear discharge. Her heart rate was noted to be high, around 125-126, but she attributes this to recent activity. She still takes metoprolol.  She mentions a history of a large left tonsil, which was previously evaluated by an ENT and considered for tonsil cancer, but the focus was shifted to her significant lung cancer per patient. She has pain when swallowing and the left side of her neck is swollen. She denies any difficulty breathing.    ROS  All other systems are reviewed and are negative for acute change except as noted in the HPI.    Allergies  Allergen Reactions   Biaxin [Clarithromycin] Nausea Only     Past Medical History:  Diagnosis Date   Anemia    Asthma, mild intermittent, well-controlled    GERD (gastroesophageal reflux disease)    Hx of migraines    Hyperlipidemia    Hypertension    Vitamin D deficiency      Past Surgical History:  Procedure Laterality Date   BREAST SURGERY     reduction   BRONCHIAL BIOPSY  08/08/2023   Procedure: BRONCHIAL BIOPSIES;  Surgeon: Leslye Peer, MD;  Location: [facility] ENDOSCOPY;  Service: Pulmonary;;   BRONCHIAL BRUSHINGS  08/08/2023   Procedure: BRONCHIAL BRUSHINGS;  Surgeon: Leslye Peer, MD;  Location[facility];  Service: Pulmonary;;   BRONCHIAL NEEDLE ASPIRATION BIOPSY  08/08/2023   Procedure: BRONCHIAL NEEDLE ASPIRATION BIOPSIES;  Surgeon: Leslye Peer, MD;  Location: [facility] ENDOSCOPY;  Service: Pulmonary;;   ENDOBRONCHIAL ULTRASOUND  Bilateral 08/08/2023   Procedure: ENDOBRONCHIAL ULTRASOUND;  Surgeon: Leslye Peer, MD;  Location: [facility];  Service: Pulmonary;  Laterality: Bilateral;   HEMOSTASIS CONTROL  08/08/2023   Procedure: HEMOSTASIS CONTROL;  Surgeon: Leslye Peer, MD;  Location: [facility] ENDOSCOPY;  Service: Pulmonary;;   IR IMAGING GUIDED PORT INSERTION  09/01/2023   REDUCTION MAMMAPLASTY     TUBAL LIGATION      Social History   Socioeconomic History   Marital status: Domestic Partner    Spouse name: Not on file   Number of children: Not on file   Years of education: Not on file   Highest education level: Not on file  Occupational History   Not on file  Tobacco Use   Smoking status: Former    Types: E-cigarettes   Smokeless tobacco: Never   Tobacco comments:    Quit in October 2024.  11/09/2023 hfb  Vaping Use   Vaping status: Every Day  Substance and Sexual Activity   Alcohol use: Yes    Alcohol/week: 3.0 standard drinks of alcohol    Types: 3 Standard drinks or equivalent per week   Drug use: Never   Sexual activity: Not on file  Other Topics Concern  Not on file  Social History Narrative   Not on file   Social Drivers of Health   Financial Resource Strain: Not on file  Food Insecurity: No Food Insecurity (12/27/2023)   Hunger Vital Sign    Worried About Running Out of Food in the Last Year: Never true    Ran Out of Food in the Last Year: Never true  Transportation Needs: No Transportation Needs (12/27/2023)   PRAPARE - Administrator, Civil Service (Medical): No    Lack of Transportation (Non-Medical): No  Recent Concern: Transportation Needs - Unmet Transportation Needs (10/17/2023)   PRAPARE - Administrator, Civil Service (Medical): Yes    Lack of Transportation (Non-Medical): No  Physical Activity: Not on file  Stress: Not on file  Social Connections: Not on file  Intimate Partner Violence: Not At Risk (12/27/2023)   Humiliation, Afraid, Rape, and Kick  questionnaire    Fear of Current or Ex-Partner: No    Emotionally Abused: No    Physically Abused: No    Sexually Abused: No    Family History  Problem Relation Age of Onset   Heart disease Father    Hypertension Father    Diabetes Father    Kidney disease Father    Cancer Brother      Current Outpatient Medications:    acetaminophen (TYLENOL) 500 MG tablet, Take 1,000 mg by mouth every 8 (eight) hours as needed for mild pain., Disp: , Rfl:    ALPRAZolam (XANAX) 0.5 MG tablet, Take 1 tablet (0.5 mg total) by mouth 3 (three) times daily as needed for anxiety. (Patient taking differently: Take 0.5 mg by mouth in the morning.), Disp: 60 tablet, Rfl: 0   apixaban (ELIQUIS) 5 MG TABS tablet, Take 1 tablet (5 mg total) by mouth 2 (two) times daily., Disp: 60 tablet, Rfl: 1   cyanocobalamin (VITAMIN B12) 500 MCG tablet, Take 1 tablet (500 mcg total) by mouth daily., Disp: 100 tablet, Rfl: 0   dexamethasone (DECADRON) 4 MG tablet, Take 1 tablet twice a day the day before, the day of, and the day after chemotherapy, Disp: 60 tablet, Rfl: 1   empagliflozin (JARDIANCE) 10 MG TABS tablet, Take 1 tablet (10 mg total) by mouth daily., Disp: 90 tablet, Rfl: 0   erlotinib (TARCEVA) 100 MG tablet, Take 1 tablet (100 mg total) by mouth daily. Take on an empty stomach 1 hour before meals or 2 hours after, Disp: 30 tablet, Rfl: 3   famotidine (PEPCID) 20 MG tablet, Take 1 tablet (20 mg total) by mouth at bedtime., Disp: 30 tablet, Rfl: 2   folic acid (FOLVITE) 1 MG tablet, TAKE 1 TABLET BY MOUTH EVERY DAY, Disp: 90 tablet, Rfl: 0   HYDROcodone-acetaminophen (NORCO/VICODIN) 5-325 MG tablet, Take 1 tablet by mouth every 6 (six) hours as needed. (Patient taking differently: Take 1 tablet by mouth every 6 (six) hours as needed (for pain).), Disp: 21 tablet, Rfl: 0   loperamide (IMODIUM) 2 MG capsule, Take 1 capsule (2 mg total) by mouth as needed for diarrhea or loose stools., Disp: 30 capsule, Rfl: 0    losartan (COZAAR) 25 MG tablet, Take 0.5 tablets (12.5 mg total) by mouth daily. (Patient not taking: Reported on 01/11/2024), Disp: 90 tablet, Rfl: 0   metoprolol succinate (TOPROL XL) 100 MG 24 hr tablet, Take 1 tablet (100 mg total) by mouth in the morning and at bedtime. Take with or immediately following a meal., Disp: 180 tablet, Rfl:  0   nystatin (MYCOSTATIN/NYSTOP) powder, Apply 1 Application topically 3 (three) times daily. Apply to under breast for rash if needed., Disp: 15 g, Rfl: 0   potassium chloride SA (KLOR-CON M) 20 MEQ tablet, Take 1 tablet (20 mEq total) by mouth daily for 14 days., Disp: 14 tablet, Rfl: 0   prochlorperazine (COMPAZINE) 10 MG tablet, Take 1 tablet (10 mg total) by mouth every 6 (six) hours as needed for nausea or vomiting., Disp: 60 tablet, Rfl: 6   Simethicone (GAS-X PO), Take 2 tablets by mouth daily as needed., Disp: , Rfl:    temazepam (RESTORIL) 30 MG capsule, Take 1 capsule (30 mg total) by mouth at bedtime as needed for sleep. (Patient taking differently: Take 30 mg by mouth at bedtime.), Disp: 30 capsule, Rfl: 3 No current facility-administered medications for this visit.  Facility-Administered Medications Ordered in Other Visits:    0.9 %  sodium chloride infusion (Manually program via Guardrails IV Fluids), 250 mL, Intravenous, Continuous, Heilingoetter, Cassandra L, PA-C, Stopped at 12/13/23 1658   0.9 %  sodium chloride infusion, , Intravenous, Continuous, Si Gaul, MD, Stopped at 12/13/23 1526  PHYSICAL EXAM: ECOG FS:1 - Symptomatic but completely ambulatory    Vitals:   01/19/24 1406 01/19/24 1500  BP: 111/77   Pulse: (!) 125 (!) 101  Resp: 18   Temp: 97.7 F (36.5 C)   TempSrc: Temporal   SpO2: 96%   Weight: 126 lb 6.4 oz (57.3 kg)    Physical Exam Vitals and nursing note reviewed.  Constitutional:      Appearance: She is not ill-appearing or toxic-appearing.  HENT:     Head: Normocephalic.     Mouth/Throat:     Comments:  Left tonsil is enlarged. No erythema or exudate. Uvula is midline. Eyes:     Conjunctiva/sclera: Conjunctivae normal.  Cardiovascular:     Rate and Rhythm: Regular rhythm. Tachycardia present.     Pulses: Normal pulses.     Heart sounds: Normal heart sounds.  Pulmonary:     Effort: Pulmonary effort is normal.     Breath sounds: Normal breath sounds.  Abdominal:     General: There is no distension.  Musculoskeletal:     Cervical back: Normal range of motion.  Lymphadenopathy:     Cervical: Cervical adenopathy present.     Left cervical: Superficial cervical adenopathy present.  Skin:    General: Skin is warm and dry.  Neurological:     Mental Status: She is alert.        LABORATORY DATA: I have reviewed the data as listed    Latest Ref Rng & Units 01/19/2024    2:20 PM 01/11/2024   10:10 AM 12/31/2023    4:42 AM  CBC  WBC 4.0 - 10.5 K/uL 10.6  10.1  2.6   Hemoglobin 12.0 - 15.0 g/dL 9.2  16.1  09.6   Hematocrit 36.0 - 46.0 % 28.5  31.4  32.0   Platelets 150 - 400 K/uL 373  385  192         Latest Ref Rng & Units 01/19/2024    2:20 PM 01/11/2024   10:10 AM 01/02/2024   11:05 AM  CMP  Glucose 70 - 99 mg/dL 96  045  409   BUN 8 - 23 mg/dL 14  11  11    Creatinine 0.44 - 1.00 mg/dL 8.11  9.14  7.82   Sodium 135 - 145 mmol/L 136  137  137   Potassium  3.5 - 5.1 mmol/L 3.4  4.1  4.1   Chloride 98 - 111 mmol/L 98  103  105   CO2 22 - 32 mmol/L 27  29  24    Calcium 8.9 - 10.3 mg/dL 9.6  8.9  8.8   Total Protein 6.5 - 8.1 g/dL 7.7  7.4    Total Bilirubin 0.0 - 1.2 mg/dL 1.1  0.7    Alkaline Phos 38 - 126 U/L 133  144    AST 15 - 41 U/L 60  44    ALT 0 - 44 U/L 28  20         RADIOGRAPHIC STUDIES (from last 24 hours if applicable) I have personally reviewed the radiological images as listed and agreed with the findings in the report. No results found.      Visit Diagnosis: 1. Nausea without vomiting   2. Decreased oral intake   3. Hypomagnesemia   4. Primary  malignant neoplasm of right lung metastatic to other site (HCC)   5. Tonsillar mass      No orders of the defined types were placed in this encounter.   All questions were answered. The patient knows to call the clinic with any problems, questions or concerns. No barriers to learning was detected.  A total of more than 30 minutes were spent on this encounter with face-to-face time and non-face-to-face time, including preparing to see the patient, ordering tests and/or medications, counseling the patient and coordination of care as outlined above.    Thank you for allowing me to participate in the care of this patient.    Shanon Ace, PA-C Department of Hematology/Oncology [facility] at [facility] Phone: [phone]  [phone]    01/19/2024 4:41 PM

## 2024-01-20 ENCOUNTER — Inpatient Hospital Stay (HOSPITAL_COMMUNITY)

## 2024-01-20 ENCOUNTER — Encounter (HOSPITAL_COMMUNITY): Payer: Self-pay | Admitting: Emergency Medicine

## 2024-01-20 ENCOUNTER — Other Ambulatory Visit: Payer: Self-pay

## 2024-01-20 ENCOUNTER — Emergency Department (HOSPITAL_COMMUNITY)

## 2024-01-20 ENCOUNTER — Inpatient Hospital Stay (HOSPITAL_COMMUNITY)
Admission: EM | Admit: 2024-01-20 | Discharge: 2024-01-26 | DRG: 146 | Disposition: A | Discharge status: Home / Self Care | Attending: Family Medicine | Admitting: Family Medicine

## 2024-01-20 DIAGNOSIS — J9311 Primary spontaneous pneumothorax: Secondary | ICD-10-CM | POA: Diagnosis present

## 2024-01-20 DIAGNOSIS — Z7901 Long term (current) use of anticoagulants: Secondary | ICD-10-CM

## 2024-01-20 DIAGNOSIS — F32A Depression, unspecified: Secondary | ICD-10-CM | POA: Diagnosis present

## 2024-01-20 DIAGNOSIS — E785 Hyperlipidemia, unspecified: Secondary | ICD-10-CM | POA: Diagnosis present

## 2024-01-20 DIAGNOSIS — Z85818 Personal history of malignant neoplasm of other sites of lip, oral cavity, and pharynx: Secondary | ICD-10-CM

## 2024-01-20 DIAGNOSIS — C3490 Malignant neoplasm of unspecified part of unspecified bronchus or lung: Secondary | ICD-10-CM | POA: Diagnosis not present

## 2024-01-20 DIAGNOSIS — Z7189 Other specified counseling: Secondary | ICD-10-CM

## 2024-01-20 DIAGNOSIS — R54 Age-related physical debility: Secondary | ICD-10-CM | POA: Diagnosis present

## 2024-01-20 DIAGNOSIS — Z66 Do not resuscitate: Secondary | ICD-10-CM | POA: Diagnosis present

## 2024-01-20 DIAGNOSIS — Z515 Encounter for palliative care: Secondary | ICD-10-CM | POA: Diagnosis not present

## 2024-01-20 DIAGNOSIS — I959 Hypotension, unspecified: Secondary | ICD-10-CM | POA: Diagnosis not present

## 2024-01-20 DIAGNOSIS — Z5982 Transportation insecurity: Secondary | ICD-10-CM

## 2024-01-20 DIAGNOSIS — E041 Nontoxic single thyroid nodule: Secondary | ICD-10-CM | POA: Diagnosis present

## 2024-01-20 DIAGNOSIS — J358 Other chronic diseases of tonsils and adenoids: Secondary | ICD-10-CM | POA: Diagnosis not present

## 2024-01-20 DIAGNOSIS — J4520 Mild intermittent asthma, uncomplicated: Secondary | ICD-10-CM | POA: Diagnosis present

## 2024-01-20 DIAGNOSIS — C099 Malignant neoplasm of tonsil, unspecified: Principal | ICD-10-CM | POA: Diagnosis present

## 2024-01-20 DIAGNOSIS — R636 Underweight: Secondary | ICD-10-CM | POA: Diagnosis present

## 2024-01-20 DIAGNOSIS — J939 Pneumothorax, unspecified: Secondary | ICD-10-CM | POA: Diagnosis not present

## 2024-01-20 DIAGNOSIS — Z9851 Tubal ligation status: Secondary | ICD-10-CM

## 2024-01-20 DIAGNOSIS — C3492 Malignant neoplasm of unspecified part of left bronchus or lung: Secondary | ICD-10-CM | POA: Diagnosis present

## 2024-01-20 DIAGNOSIS — Z6820 Body mass index (BMI) 20.0-20.9, adult: Secondary | ICD-10-CM

## 2024-01-20 DIAGNOSIS — I5022 Chronic systolic (congestive) heart failure: Secondary | ICD-10-CM | POA: Diagnosis present

## 2024-01-20 DIAGNOSIS — F419 Anxiety disorder, unspecified: Secondary | ICD-10-CM | POA: Diagnosis present

## 2024-01-20 DIAGNOSIS — R000 Tachycardia, unspecified: Secondary | ICD-10-CM | POA: Diagnosis present

## 2024-01-20 DIAGNOSIS — Z7984 Long term (current) use of oral hypoglycemic drugs: Secondary | ICD-10-CM | POA: Diagnosis not present

## 2024-01-20 DIAGNOSIS — C787 Secondary malignant neoplasm of liver and intrahepatic bile duct: Secondary | ICD-10-CM | POA: Diagnosis present

## 2024-01-20 DIAGNOSIS — R599 Enlarged lymph nodes, unspecified: Secondary | ICD-10-CM | POA: Diagnosis present

## 2024-01-20 DIAGNOSIS — R041 Hemorrhage from throat: Secondary | ICD-10-CM | POA: Diagnosis present

## 2024-01-20 DIAGNOSIS — R58 Hemorrhage, not elsewhere classified: Secondary | ICD-10-CM | POA: Diagnosis present

## 2024-01-20 DIAGNOSIS — I110 Hypertensive heart disease with heart failure: Secondary | ICD-10-CM | POA: Diagnosis present

## 2024-01-20 DIAGNOSIS — Z79899 Other long term (current) drug therapy: Secondary | ICD-10-CM

## 2024-01-20 DIAGNOSIS — D490 Neoplasm of unspecified behavior of digestive system: Principal | ICD-10-CM

## 2024-01-20 DIAGNOSIS — Z881 Allergy status to other antibiotic agents status: Secondary | ICD-10-CM

## 2024-01-20 DIAGNOSIS — C3491 Malignant neoplasm of unspecified part of right bronchus or lung: Secondary | ICD-10-CM | POA: Diagnosis not present

## 2024-01-20 DIAGNOSIS — D61810 Antineoplastic chemotherapy induced pancytopenia: Secondary | ICD-10-CM | POA: Diagnosis not present

## 2024-01-20 DIAGNOSIS — Z86711 Personal history of pulmonary embolism: Secondary | ICD-10-CM

## 2024-01-20 DIAGNOSIS — E876 Hypokalemia: Secondary | ICD-10-CM | POA: Diagnosis not present

## 2024-01-20 DIAGNOSIS — Z8249 Family history of ischemic heart disease and other diseases of the circulatory system: Secondary | ICD-10-CM

## 2024-01-20 DIAGNOSIS — Z85118 Personal history of other malignant neoplasm of bronchus and lung: Secondary | ICD-10-CM

## 2024-01-20 DIAGNOSIS — R64 Cachexia: Secondary | ICD-10-CM | POA: Diagnosis present

## 2024-01-20 DIAGNOSIS — Z87891 Personal history of nicotine dependence: Secondary | ICD-10-CM

## 2024-01-20 LAB — CBC WITH DIFFERENTIAL/PLATELET
Abs Immature Granulocytes: 0.07 10*3/uL (ref 0.00–0.07)
Basophils Absolute: 0 10*3/uL (ref 0.0–0.1)
Basophils Relative: 0 %
Eosinophils Absolute: 0.5 10*3/uL (ref 0.0–0.5)
Eosinophils Relative: 6 %
HCT: 26 % — ABNORMAL LOW (ref 36.0–46.0)
Hemoglobin: 8 g/dL — ABNORMAL LOW (ref 12.0–15.0)
Immature Granulocytes: 1 %
Lymphocytes Relative: 7 %
Lymphs Abs: 0.6 10*3/uL — ABNORMAL LOW (ref 0.7–4.0)
MCH: 27.3 pg (ref 26.0–34.0)
MCHC: 30.8 g/dL (ref 30.0–36.0)
MCV: 88.7 fL (ref 80.0–100.0)
Monocytes Absolute: 0.6 10*3/uL (ref 0.1–1.0)
Monocytes Relative: 7 %
Neutro Abs: 6.8 10*3/uL (ref 1.7–7.7)
Neutrophils Relative %: 79 %
Platelets: 283 10*3/uL (ref 150–400)
RBC: 2.93 MIL/uL — ABNORMAL LOW (ref 3.87–5.11)
RDW: 19 % — ABNORMAL HIGH (ref 11.5–15.5)
WBC: 8.7 10*3/uL (ref 4.0–10.5)
nRBC: 0 % (ref 0.0–0.2)

## 2024-01-20 LAB — GLUCOSE, CAPILLARY
Glucose-Capillary: 83 mg/dL (ref 70–99)
Glucose-Capillary: 88 mg/dL (ref 70–99)

## 2024-01-20 LAB — BASIC METABOLIC PANEL
Anion gap: 10 (ref 5–15)
BUN: 13 mg/dL (ref 8–23)
CO2: 22 mmol/L (ref 22–32)
Calcium: 8.6 mg/dL — ABNORMAL LOW (ref 8.9–10.3)
Chloride: 103 mmol/L (ref 98–111)
Creatinine, Ser: 0.76 mg/dL (ref 0.44–1.00)
GFR, Estimated: >60 mL/min (ref >60)
Glucose, Bld: 90 mg/dL (ref 70–99)
Potassium: 3.2 mmol/L — ABNORMAL LOW (ref 3.5–5.1)
Sodium: 135 mmol/L (ref 135–145)

## 2024-01-20 LAB — PROTIME-INR
INR: 2 — ABNORMAL HIGH (ref 0.8–1.2)
Prothrombin Time: 22.5 s — ABNORMAL HIGH (ref 11.4–15.2)

## 2024-01-20 LAB — TYPE AND SCREEN
ABO/RH(D): B POS
Antibody Screen: NEGATIVE

## 2024-01-20 LAB — APTT: aPTT: 47 s — ABNORMAL HIGH (ref 24–36)

## 2024-01-20 LAB — MRSA NEXT GEN BY PCR, NASAL: MRSA by PCR Next Gen: NOT DETECTED

## 2024-01-20 MED ORDER — ORAL CARE MOUTH RINSE: 15.0000 mL | OROMUCOSAL | Status: DC | PRN

## 2024-01-20 MED ORDER — LORAZEPAM 2 MG/ML IJ SOLN
1.0000 mg | Freq: Once | INTRAMUSCULAR | Status: AC
  Administered 2024-01-20: 1 mg via INTRAVENOUS
  Filled 2024-01-20: qty 1

## 2024-01-20 MED ORDER — POLYETHYLENE GLYCOL 3350 17 G PO PACK: 17.0000 g | PACK | Freq: Every day | ORAL | Status: DC | PRN

## 2024-01-20 MED ORDER — TRANEXAMIC ACID FOR INHALATION
500.0000 mg | Freq: Once | RESPIRATORY_TRACT | Status: AC
  Administered 2024-01-20: 500 mg via RESPIRATORY_TRACT
  Filled 2024-01-20: qty 10

## 2024-01-20 MED ORDER — PANTOPRAZOLE SODIUM 40 MG IV SOLR
40.0000 mg | INTRAVENOUS | Status: DC
  Administered 2024-01-21 – 2024-01-22 (×2): 40 mg via INTRAVENOUS
  Filled 2024-01-20 (×3): qty 10

## 2024-01-20 MED ORDER — TRANEXAMIC ACID FOR INHALATION
500.0000 mg | Freq: Three times a day (TID) | RESPIRATORY_TRACT | Status: DC
  Administered 2024-01-20: 500 mg via RESPIRATORY_TRACT
  Filled 2024-01-20 (×4): qty 10

## 2024-01-20 MED ORDER — MAGNESIUM SULFATE 4 GM/100ML IV SOLN
4.0000 g | Freq: Once | INTRAVENOUS | Status: AC
  Administered 2024-01-20: 4 g via INTRAVENOUS
  Filled 2024-01-20 (×2): qty 100

## 2024-01-20 MED ORDER — CHLORHEXIDINE GLUCONATE CLOTH 2 % EX PADS
6.0000 | MEDICATED_PAD | Freq: Every day | CUTANEOUS | Status: DC
  Administered 2024-01-20 – 2024-01-26 (×7): 6 via TOPICAL

## 2024-01-20 MED ORDER — DOCUSATE SODIUM 100 MG PO CAPS: 100.0000 mg | ORAL_CAPSULE | Freq: Two times a day (BID) | ORAL | Status: DC | PRN

## 2024-01-20 MED ORDER — EMPTY CONTAINERS FLEXIBLE MISC
900.0000 mg | Freq: Once | Status: AC
  Administered 2024-01-20: 900 mg via INTRAVENOUS
  Filled 2024-01-20: qty 90

## 2024-01-20 MED ORDER — IOHEXOL 300 MG/ML  SOLN
100.0000 mL | Freq: Once | INTRAMUSCULAR | Status: AC | PRN
Start: 2024-01-20 — End: 2024-01-20
  Administered 2024-01-20: 100 mL via INTRAVENOUS

## 2024-01-20 MED ORDER — DEXTROSE IN LACTATED RINGERS 5 % IV SOLN: INTRAVENOUS | Status: AC

## 2024-01-20 MED ORDER — LORAZEPAM 2 MG/ML IJ SOLN
1.0000 mg | INTRAMUSCULAR | Status: DC | PRN
  Administered 2024-01-20 – 2024-01-23 (×8): 1 mg via INTRAVENOUS
  Filled 2024-01-20 (×8): qty 1

## 2024-01-20 MED ORDER — SODIUM CHLORIDE 0.9% FLUSH
10.0000 mL | INTRAVENOUS | Status: DC | PRN
  Administered 2024-01-25 (×2): 20 mL

## 2024-01-20 NOTE — ED Notes (Signed)
 Pt O2 saturation has maintained in the low 90's. Pt and her husband state that is her normal level. Pt refused oxygen at this time.

## 2024-01-20 NOTE — ED Notes (Signed)
 Report given to Clelia Schaumann at [facility] ICU. [facility] called.

## 2024-01-20 NOTE — ED Provider Notes (Signed)
 [facility] AT [facility] Provider Note   CSN: [id number] Arrival date & time: 01/20/24  4098     History  Chief Complaint  Patient presents with   Sore Throat    Rebecca Ochoa is a 63 y.o. female.  Patient is a 63 year old female who presents with bleeding from her throat.  She has a history of hypertension and hyperlipidemia.  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  She is undergoing both IV and oral chemotherapy regimens.  She states that she has had a known mass to her left tonsil for a while now.  She states that she has seen ENT previously for this.  However she was told that right now the primary focus is to treat her lung cancer.  She has not had a biopsy of this area.  She said this morning it felt like something "burst".  She had large amount of bleeding for about 10 to 15 minutes and then it started easing off.  She denies any difficulty swallowing or increased shortness of breath related to the mass.  She is not on anticoagulants.  She has some fatigue and shortness of breath at baseline.       Home Medications Prior to Admission medications   Medication Sig Start Date End Date Taking? Authorizing Provider  acetaminophen (TYLENOL) 500 MG tablet Take 1,000 mg by mouth every 8 (eight) hours as needed for mild pain.   Yes [provider]  ALPRAZolam (XANAX) 0.5 MG tablet Take 1 tablet (0.5 mg total) by mouth 3 (three) times daily as needed for anxiety. Patient taking differently: Take 0.5 mg by mouth See admin instructions. Take 0.5 mg by mouth in the morning and an additional 0.5 mg once a day as needed for anxiety 12/13/23  Yes Pickenpack-Cousar, Arty Baumgartner, NP  apixaban (ELIQUIS) 5 MG TABS tablet Take 1 tablet (5 mg total) by mouth 2 (two) times daily. 09/25/23  Yes Jonah Blue, MD  cyanocobalamin (VITAMIN B12) 500 MCG tablet Take 1 tablet (500 mcg total) by mouth  daily. 10/23/23 01/31/24 Yes Pokhrel, Laxman, MD  empagliflozin (JARDIANCE) 10 MG TABS tablet Take 1 tablet (10 mg total) by mouth daily. 01/02/24  Yes Standley Brooking, MD  erlotinib (TARCEVA) 100 MG tablet Take 1 tablet (100 mg total) by mouth daily. Take on an empty stomach 1 hour before meals or 2 hours after 12/14/23  Yes Si Gaul, MD  famotidine (PEPCID) 20 MG tablet Take 1 tablet (20 mg total) by mouth at bedtime. 12/13/23  Yes Pickenpack-Cousar, Arty Baumgartner, NP  folic acid (FOLVITE) 1 MG tablet TAKE 1 TABLET BY MOUTH EVERY DAY 12/22/23  Yes Heilingoetter, Cassandra L, PA-C  HYDROcodone bit-homatropine (HYCODAN) 5-1.5 MG/5ML syrup Take 5 mLs by mouth every 6 (six) hours as needed for cough.   Yes [provider]  HYDROcodone-acetaminophen (NORCO/VICODIN) 5-325 MG tablet Take 1 tablet by mouth every 6 (six) hours as needed. Patient taking differently: Take 1 tablet by mouth every 6 (six) hours as needed (for pain). 09/07/23  Yes Rhetta Mura, MD  loperamide (IMODIUM) 2 MG capsule Take 1 capsule (2 mg total) by mouth as needed for diarrhea or loose stools. 10/23/23  Yes Pokhrel, Laxman, MD  metoprolol succinate (TOPROL XL) 100 MG 24 hr tablet Take 1 tablet (100 mg total) by mouth in the morning and at bedtime. Take with or immediately following a meal. 01/02/24 01/01/25 Yes Irene Limbo,  Melton Alar, MD  prochlorperazine (COMPAZINE) 10 MG tablet Take 1 tablet (10 mg total) by mouth every 6 (six) hours as needed for nausea or vomiting. 12/15/23  Yes Pickenpack-Cousar, Arty Baumgartner, NP  Simethicone (GAS-X PO) Take 2 tablets by mouth daily as needed (for gas).   Yes [provider]  temazepam (RESTORIL) 30 MG capsule Take 1 capsule (30 mg total) by mouth at bedtime as needed for sleep. Patient taking differently: Take 30 mg by mouth at bedtime. 12/13/23  Yes Pickenpack-Cousar, Arty Baumgartner, NP  dexamethasone (DECADRON) 4 MG tablet Take 1 tablet twice a day the day before, the day of, and the day  after chemotherapy Patient not taking: Reported on 01/20/2024 12/13/23   Heilingoetter, Cassandra L, PA-C  losartan (COZAAR) 25 MG tablet Take 0.5 tablets (12.5 mg total) by mouth daily. Patient not taking: Reported on 01/11/2024 01/02/24   Standley Brooking, MD  nystatin (MYCOSTATIN/NYSTOP) powder Apply 1 Application topically 3 (three) times daily. Apply to under breast for rash if needed. Patient not taking: Reported on 01/20/2024 11/27/23   Namon Cirri E, PA-C  potassium chloride SA (KLOR-CON M) 20 MEQ tablet Take 1 tablet (20 mEq total) by mouth daily for 14 days. Patient not taking: Reported on 01/20/2024 01/02/24 01/20/24  Standley Brooking, MD  bisoprolol-hydrochlorothiazide [facility]) 5-6.25 MG tablet Take  1 tablet  Daily  for BP 06/06/22 07/11/22  [name], MD      Allergies    Biaxin [clarithromycin]    Review of Systems   Review of Systems  Constitutional:  Positive for fatigue. Negative for chills, diaphoresis and fever.  HENT:  Positive for sore throat (Burning to the back of the throat, bleeding from her tonsil). Negative for congestion, rhinorrhea, sneezing, trouble swallowing and voice change.   Eyes: Negative.   Respiratory:  Positive for shortness of breath. Negative for cough and chest tightness.   Cardiovascular:  Negative for chest pain and leg swelling.  Gastrointestinal:  Negative for abdominal pain, blood in stool, diarrhea, nausea and vomiting.  Genitourinary:  Negative for difficulty urinating, flank pain, frequency and hematuria.  Musculoskeletal:  Negative for arthralgias and back pain.  Skin:  Negative for rash.  Neurological:  Negative for dizziness, speech difficulty, weakness, numbness and headaches.    Physical Exam Updated Vital Signs BP 118/69   Pulse (!) 113   Temp 97.8 F (36.6 C) (Oral)   Resp 18   Ht 5\' 5"  (1.651 m)   Wt 57.2 kg   SpO2 91%   BMI 20.97 kg/m  Physical Exam Constitutional:      Appearance: She is well-developed. She is  ill-appearing (Chronically ill-appearing).  HENT:     Head: Normocephalic and atraumatic.     Mouth/Throat:     Comments: Large mass noted to the left tonsillar area.  No current bleeding from the area. Eyes:     Pupils: Pupils are equal, round, and reactive to light.  Cardiovascular:     Rate and Rhythm: Normal rate and regular rhythm.     Heart sounds: Normal heart sounds.  Pulmonary:     Effort: Pulmonary effort is normal. No respiratory distress.     Breath sounds: Normal breath sounds. No wheezing or rales.  Chest:     Chest wall: No tenderness.  Abdominal:     General: Bowel sounds are normal.     Palpations: Abdomen is soft.     Tenderness: There is no abdominal tenderness. There is no guarding or rebound.  Musculoskeletal:        General: Normal range of motion.     Cervical back: Normal range of motion and neck supple.  Lymphadenopathy:     Cervical: No cervical adenopathy.  Skin:    General: Skin is warm and dry.     Findings: No rash.  Neurological:     Mental Status: She is alert and oriented to person, place, and time.     ED Results / Procedures / Treatments   Labs (all labs ordered are listed, but only abnormal results are displayed) Labs Reviewed  BASIC METABOLIC PANEL - Abnormal; Notable for the following components:      Result Value   Potassium 3.2 (*)    Calcium 8.6 (*)    All other components within normal limits  CBC WITH DIFFERENTIAL/PLATELET - Abnormal; Notable for the following components:   RBC 2.93 (*)    Hemoglobin 8.0 (*)    HCT 26.0 (*)    RDW 19.0 (*)    Lymphs Abs 0.6 (*)    All other components within normal limits  PROTIME-INR - Abnormal; Notable for the following components:   Prothrombin Time 22.5 (*)    INR 2.0 (*)    All other components within normal limits  APTT - Abnormal; Notable for the following components:   aPTT 47 (*)    All other components within normal limits  CBC  BASIC METABOLIC PANEL  MAGNESIUM  PHOSPHORUS   PROTIME-INR  TYPE AND SCREEN    EKG None  Radiology CT CHEST WO CONTRAST Result Date: 01/20/2024 CLINICAL DATA:  Non-small cell lung cancer, bilateral pneumothoraces. * Tracking Code: BO * EXAM: CT CHEST WITHOUT CONTRAST TECHNIQUE: Multidetector CT imaging of the chest was performed following the standard protocol without IV contrast. RADIATION DOSE REDUCTION: This exam was performed according to the departmental dose-optimization program which includes automated exposure control, adjustment of the mA and/or kV according to patient size and/or use of iterative reconstruction technique. COMPARISON:  Neck CT 01/20/2024 and CTA chest 12/26/2023 FINDINGS: Cardiovascular: Left Port-A-Cath tip: Cavoatrial junction. Coronary, aortic arch, and branch vessel atherosclerotic vascular disease. Mediastinum/Nodes: Aside from the right hilar mass there is little in the way of thoracic adenopathy. Lungs/Pleura: 30% right pneumothorax. Some of the right upper lobe may be tethered to the pleura by a peripheral 3.6 cm mass on image 28 series 4. Small amount of complex pleural fluid making this a hydropneumothorax. 10% left pneumothorax noted likewise with some dependent pleural density potentially from trace pleural fluid and with some loculated pneumothorax posteriorly along the posterior costophrenic angle. Scattered nodules and masses in both lungs. Dominant right hilar mass about 4.3 cm in diameter. Upper Abdomen: Abdominal aortic atherosclerosis. Indistinct masses in the left hepatic lobe. Pathologic peripancreatic node 1.5 cm in short axis, image 50 series 2. Musculoskeletal: Mild thoracic kyphosis. IMPRESSION: 1. 30% right hydropneumothorax. Some of the right upper lobe may be tethered to the pleura by a peripheral 3.6 cm mass. 2. 10% left pneumothorax with some dependent pleural density potentially from trace pleural fluid and with some loculated pneumothorax posteriorly along the posterior costophrenic angle. 3.  Scattered nodules and masses in both lungs, compatible with metastatic disease. Dominant right hilar mass about 4.3 cm in diameter. 4. Indistinct masses in the left hepatic lobe, compatible with metastatic disease. Stable pathologic peripancreatic adenopathy. 5. Coronary, aortic arch, and branch vessel atherosclerotic vascular disease. Aortic Atherosclerosis (ICD10-I70.0). 6. Mild thoracic kyphosis. Electronically Signed   By: Annitta Needs.D.  On: 01/20/2024 16:54   DG Chest Port 1 View Result Date: 01/20/2024 CLINICAL DATA:  Bilateral pneumothoraces seen on a neck CT with contrast earlier today. Non-small-cell lung cancer. EXAM: PORTABLE CHEST 1 VIEW COMPARISON:  Neck CT with contrast obtained earlier today. Portable chest dated 12/26/2023. FINDINGS: Normal sized heart. Approximately 30% right apical, lateral and basilar pneumothorax. This is partially loculated. Less than 5% left apical pneumothorax. Multiple bilateral lung masses and right hilar mass with progression since 12/26/2023. Right upper lobe atelectasis/postobstructive changes unchanged from the neck CT earlier today. Mild right basilar linear atelectasis. No mediastinal shift. Minimal bilateral glenohumeral degenerative changes. Right upper lobe zone subsegmental atelectasis, postobstructive changes and mild right basilar linear atelectasis. IMPRESSION: 1. Approximately 30% right pneumothorax, partially loculated. 2. Less than 5% left apical pneumothorax. 3. Multiple bilateral lung masses and right hilar mass with progression since 12/26/2023. 4. Right upper lobe zone subsegmental atelectasis/postobstructive changes and mild right basilar linear atelectasis. Electronically Signed   By: Beckie Salts M.D.   On: 01/20/2024 15:18   CT Soft Tissue Neck W Contrast Addendum Date: 01/20/2024 ADDENDUM REPORT: 01/20/2024 14:41 ADDENDUM: These results were called by telephone at the time of interpretation on 01/20/2024 at 2:21 pm to provider Jonny Ruiz,  PA, who verbally acknowledged these results. Electronically Signed   By: Emily Filbert M.D.   On: 01/20/2024 14:41   Result Date: 01/20/2024 CLINICAL DATA:  Burning sensation in back of throat, spit out blood this morning. Sore throat for a week. EXAM: CT NECK WITH CONTRAST TECHNIQUE: Multidetector CT imaging of the neck was performed using the standard protocol following the bolus administration of intravenous contrast. RADIATION DOSE REDUCTION: This exam was performed according to the departmental dose-optimization program which includes automated exposure control, adjustment of the mA and/or kV according to patient size and/or use of iterative reconstruction technique. CONTRAST:  OMNIPAQUE IOHEXOL 300 MG/ML  SOLN COMPARISON:  CTA head and neck 09/23/2023. FINDINGS: Suprahyoid neck: Interval increase in size of heterogeneous mass involving the region of the left palatine tonsil measuring 2.6 x 2.6 x 3.2 cm, previously measuring 1.6 x 2.1 x 2.3 cm. The mass demonstrates prominent nodular areas of enhancement slightly increased since the prior study. The mass extends along the left lateral aspect of the oropharynx and partially involves the left posterior aspect of the soft palate. Mass also extends inferiorly to involve/abut the left base of tongue. There is resulting mild narrowing and rightward displacement of the or pharyngeal airway which is increased from prior. The nasopharynx is symmetric. The floor of mouth is unremarkable. Normal appearance of the epiglottis. Retropharynx is unremarkable. Infrahyoid neck: No mass or swelling. Aryepiglottic folds and pyriform sinuses are symmetric. Vocal folds are symmetric. Salivary glands: The parotid and submandibular glands are symmetric. No inflammation, mass, or calcification. Lymph nodes: Interval development of an enlarged heterogeneous left level 2A cervical node measuring 2.0 x 1.3 x 2.4 cm. The no demonstrates nodular areas of enhancement and additional  areas of central hypoattenuation which could reflect necrosis similar in appearance to the left tonsillar mass. No enlarged right cervical lymph nodes. Thyroid: 1.6 cm hypoattenuating nodule along the lateral aspect of the right thyroid lobe. Vascular: Moderate atherosclerosis of the thoracic aorta. Additional mild atherosclerosis at the right carotid bifurcation. Limited intracranial: Atherosclerosis of the intracranial ICAs. Visualized intracranial structures otherwise unremarkable. Visualized orbits: Orbits are symmetric. Mastoids and visualized paranasal sinuses: Visualized paranasal sinuses are clear. Mastoid air cells are clear. Skeleton: No acute or aggressive lesion. Upper chest:  Redemonstrated dominant mass in the right upper lobe involving the pleura which is overall similar to 12/26/2023. Similar appearance of right hilar mass. Additional scattered masses in the visualized lungs are similar to prior. There is interval development of pneumothorax bilaterally more pronounced on the right. Left chest wall Port-A-Cath partially visualized. IMPRESSION: Interval increase in size of heterogeneous mass involving the left palatine tonsil now measuring up to 3.2 cm, previously 2.3 cm. Mass abuts and possibly involves the soft palate and left aspect of the base of tongue. Mild narrowing and rightward deviation of the oropharyngeal airway is increased from prior. Interval development of enlarged enhancing left level 2A cervical node with similar characteristics to the left tonsillar mass. Similar appearance of bilateral pulmonary masses and right hilar mass compared to CT on 12/26/2023. Interval development of bilateral pneumothoraces, right greater than left. 1.6 cm right thyroid nodule. Recommend correlation with nonemergent thyroid ultrasound. Electronically Signed: By: Emily Filbert M.D. On: 01/20/2024 14:05    Procedures Procedures    Medications Ordered in ED Medications  docusate sodium (COLACE)  capsule 100 mg (has no administration in time range)  polyethylene glycol (MIRALAX / GLYCOLAX) packet 17 g (has no administration in time range)  dextrose 5 % in lactated ringers infusion (has no administration in time range)  pantoprazole (PROTONIX) injection 40 mg (has no administration in time range)  tranexamic acid (CYKLOKAPRON) 1000 MG/10ML nebulizer solution 500 mg (has no administration in time range)  magnesium sulfate IVPB 4 g 100 mL (has no administration in time range)  sodium chloride flush (NS) 0.9 % injection 10-40 mL (has no administration in time range)  Chlorhexidine Gluconate Cloth 2 % PADS 6 each (has no administration in time range)  iohexol (OMNIPAQUE) 300 MG/ML solution 100 mL (100 mLs Intravenous Contrast Given 01/20/24 1124)  LORazepam (ATIVAN) injection 1 mg (1 mg Intravenous Given 01/20/24 1155)  LORazepam (ATIVAN) injection 1 mg (1 mg Intravenous Given 01/20/24 1616)  tranexamic acid (CYKLOKAPRON) 1000 MG/10ML nebulizer solution 500 mg (500 mg Nebulization Given 01/20/24 1746)  coag fact Xa recombinant (ANDEXXA) low dose infusion 900 mg (900 mg Intravenous New Bag/Given 01/20/24 1833)    ED Course/ Medical Decision Making/ A&P Clinical Course as of 01/20/24 1917  Sat Jan 20, 2024  1421 Per rad, left sided neck mass that is larger and mild mass effect on the upper airway. Multiple pneumathoraces bilateral. Other findings in CT result.  [JR]    Clinical Course User Index [JR] Gareth Eagle, PA-C                                 Medical Decision Making Amount and/or Complexity of Data Reviewed Labs: ordered. Radiology: ordered.  Risk Prescription drug management. Decision regarding hospitalization.   Patient is a 63 year old who presents with bleeding from a neck mass.  She is noted to have a necrotic looking mass in her left tonsil.  When I first evaluated her, it was not bleeding.  It had blood this morning but stopped spontaneously.  Labs show slight drop  in her hemoglobin.  CT scan shows an enlarging left mass on her tonsil with vascularity.  Discussed findings with the ENT.  Will try nebulized TXA.  Discussed with Dr. Ernestene Kiel.  He recommends consulting IR for embolization.  I spoke with Dr. Corliss Skains.  He advises patient will need to be admitted to [facility], ICU and once stabilized, he can consult for  embolization.  On imaging, patient was noted to have bilateral pneumothoraces.  The right 1 is about 30%.  Chest x-ray was performed which confirmed these bilateral pneumothoraces.  CT chest was also done.  Discussed with pulmonology, Dr. Marchelle Gearing as well at CT surgery.  Initially was planning to place a pigtail catheter in the right pneumothoraces.  However pulmonology wants to hold off for now and monitor overnight given her stability to see what happens with her neck mass.  It started bleeding again while she was here in the ED.  Her Eliquis was reversed with Andexxa.  Will be admitted by ICU team to the [facility], ICU.  CRITICAL CARE Performed by: Rolan Bucco Total critical care time: 90 minutes Critical care time was exclusive of separately billable procedures and treating other patients. Critical care was necessary to treat or prevent imminent or life-threatening deterioration. Critical care was time spent personally by me on the following activities: development of treatment plan with patient and/or surrogate as well as nursing, discussions with consultants, evaluation of patient's response to treatment, examination of patient, obtaining history from patient or surrogate, ordering and performing treatments and interventions, ordering and review of laboratory studies, ordering and review of radiographic studies, pulse oximetry and re-evaluation of patient's condition.   Final Clinical Impression(s) / ED Diagnoses Final diagnoses:  Tonsillar tumor  Primary spontaneous pneumothorax    Rx / DC Orders ED Discharge Orders     None          Rolan Bucco, MD 01/20/24 Ernestina [place]

## 2024-01-20 NOTE — Progress Notes (Signed)
 eLink Physician-Brief Progress Note Patient Name: Rebecca Ochoa DOB: Oct 06, 1961 MRN: [id number]   Date of Service  01/20/2024  HPI/Events of Note  63 y.o. 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 Her most recent cancer center/oncology visit was yesterday January 19, 2024 for management of multiple chemotherapy side effects.   Patient is tachypneic with increased anxiety despite Restoril and Xanax.  Felt that Ativan was most effective  Has bilateral pneumothoraxes.  Has advanced stage lung cancer with a tonsillar lesion and bleeding.  Anticipating embolization tomorrow morning. Labs, vitals, and imaging reviewed personally.  eICU Interventions  Renew Ativan for palliative effect  Discussed with Ramaswamy and ground team-anticipating intubation for her procedure.  May benefit from chest tube-ground team to reassess  Ongoing treatment with localized TXA-nebulized.  Strict n.p.o.  DVT prophylaxis with SCDs GI prophylaxis with treatment dose pantoprazole        Jessikah Dicker 01/20/2024, 9:42 PM

## 2024-01-20 NOTE — ED Triage Notes (Signed)
 Pt bib EMS from home. Pt c/o burning sensation in back of throat and spit out some blood this morning. States she has had a sore throat for about a week. She states she felt something bust and can feel the skin on the left side of mouth. Denies N/V/D.

## 2024-01-20 NOTE — Consult Note (Signed)
 ENT CONSULT:  Reason for Consult: Bleeding from tonsillar cancer  Referring Physician:  ED Attending  HPI: Rebecca Ochoa is an 63 y.o. female with a history of Stage IVB ([id number]) Non small cell lung cancer on systemic chemotherapy and immunotherapy, as well as concurrent left oropharynx tonsillar cancer (diagnosed approximately same time both considered inenligible for curative therapy ie surgery or radiation given concurrent stage 4 lung cancer).  Her most recent cancer center/oncology visit was yesterday January 19, 2024 for management of multiple chemotherapy side effects.  She presented to the [facility] emergency room today with throat bleeding.  ED workup notable for large left tonsillar tumor and necrotic lymph nodes on CT neck.  In addition patient found to have bilateral pneumothoraces.  ENT consulted for guidance on management of tonsil cancer bleeding.  The patient and her husband report first episode of throat bleeding ever was this morning. Increased amount I.e a half cup prompted ED evaluation. She has had intermittent spit up of blood here. No airway obstruction yet from this.   Her husband is at bedside.  The patient is currently a full code and would like all medical care /procedural interventions available to extend her life.    Past Medical History:  Diagnosis Date   Anemia    Asthma, mild intermittent, well-controlled    GERD (gastroesophageal reflux disease)    Hx of migraines    Hyperlipidemia    Hypertension    Vitamin D deficiency     Past Surgical History:  Procedure Laterality Date   BREAST SURGERY     reduction   BRONCHIAL BIOPSY  08/08/2023   Procedure: BRONCHIAL BIOPSIES;  Surgeon: Leslye Peer, MD;  Location: [facility] ENDOSCOPY;  Service: Pulmonary;;   BRONCHIAL BRUSHINGS  08/08/2023   Procedure: BRONCHIAL BRUSHINGS;  Surgeon: Leslye Peer, MD;  Location: [facility] ENDOSCOPY;  Service: Pulmonary;;   BRONCHIAL NEEDLE ASPIRATION BIOPSY  08/08/2023   Procedure:  BRONCHIAL NEEDLE ASPIRATION BIOPSIES;  Surgeon: Leslye Peer, MD;  Location: [facility] ENDOSCOPY;  Service: Pulmonary;;   ENDOBRONCHIAL ULTRASOUND Bilateral 08/08/2023   Procedure: ENDOBRONCHIAL ULTRASOUND;  Surgeon: Leslye Peer, MD;  Location: [facility] ENDOSCOPY;  Service: Pulmonary;  Laterality: Bilateral;   HEMOSTASIS CONTROL  08/08/2023   Procedure: HEMOSTASIS CONTROL;  Surgeon: Leslye Peer, MD;  Location: [facility] ENDOSCOPY;  Service: Pulmonary;;   IR IMAGING GUIDED PORT INSERTION  09/01/2023   REDUCTION MAMMAPLASTY     TUBAL LIGATION      Family History  Problem Relation Age of Onset   Heart disease Father    Hypertension Father    Diabetes Father    Kidney disease Father    Cancer Brother     Social History:  reports that she has quit smoking. Her smoking use included e-cigarettes. She has never used smokeless tobacco. She reports current alcohol use of about 3.0 standard drinks of alcohol per week. She reports that she does not use drugs.  Allergies:  Allergies  Allergen Reactions   Biaxin [Clarithromycin] Nausea Only    Medications: I have reviewed the patient's current medications.  Results for orders placed or performed during the hospital encounter of 01/20/24 (from the past 48 hours)  Basic metabolic panel     Status: Abnormal   Collection Time: 01/20/24 11:10 AM  Result Value Ref Range   Sodium 135 135 - 145 mmol/L   Potassium 3.2 (L) 3.5 - 5.1 mmol/L   Chloride 103 98 - 111 mmol/L   CO2 22 22 - 32  mmol/L   Glucose, Bld 90 70 - 99 mg/dL    Comment: Glucose reference range applies only to samples taken after fasting for at least 8 hours.   BUN 13 8 - 23 mg/dL   Creatinine, Ser 7.82 0.44 - 1.00 mg/dL   Calcium 8.6 (L) 8.9 - 10.3 mg/dL   GFR, Estimated >95 >62 mL/min    Comment: (NOTE) Calculated using the CKD-EPI Creatinine Equation (2021)    Anion gap 10 5 - 15    Comment: Performed at [facility], [address] [place], [place] [address]  CBC  with Differential     Status: Abnormal   Collection Time: 01/20/24 11:10 AM  Result Value Ref Range   WBC 8.7 4.0 - 10.5 K/uL   RBC 2.93 (L) 3.87 - 5.11 MIL/uL   Hemoglobin 8.0 (L) 12.0 - 15.0 g/dL   HCT 57.8 (L) 46.9 - 62.9 %   MCV 88.7 80.0 - 100.0 fL   MCH 27.3 26.0 - 34.0 pg   MCHC 30.8 30.0 - 36.0 g/dL   RDW 52.8 (H) 41.3 - 24.4 %   Platelets 283 150 - 400 K/uL   nRBC 0.0 0.0 - 0.2 %   Neutrophils Relative % 79 %   Neutro Abs 6.8 1.7 - 7.7 K/uL   Lymphocytes Relative 7 %   Lymphs Abs 0.6 (L) 0.7 - 4.0 K/uL   Monocytes Relative 7 %   Monocytes Absolute 0.6 0.1 - 1.0 K/uL   Eosinophils Relative 6 %   Eosinophils Absolute 0.5 0.0 - 0.5 K/uL   Basophils Relative 0 %   Basophils Absolute 0.0 0.0 - 0.1 K/uL   Immature Granulocytes 1 %   Abs Immature Granulocytes 0.07 0.00 - 0.07 K/uL    Comment: Performed at [facility], [address] [place], [place] [address]    DG Chest Port 1 View Result Date: 01/20/2024 CLINICAL DATA:  Bilateral pneumothoraces seen on a neck CT with contrast earlier today. Non-small-cell lung cancer. EXAM: PORTABLE CHEST 1 VIEW COMPARISON:  Neck CT with contrast obtained earlier today. Portable chest dated 12/26/2023. FINDINGS: Normal sized heart. Approximately 30% right apical, lateral and basilar pneumothorax. This is partially loculated. Less than 5% left apical pneumothorax. Multiple bilateral lung masses and right hilar mass with progression since 12/26/2023. Right upper lobe atelectasis/postobstructive changes unchanged from the neck CT earlier today. Mild right basilar linear atelectasis. No mediastinal shift. Minimal bilateral glenohumeral degenerative changes. Right upper lobe zone subsegmental atelectasis, postobstructive changes and mild right basilar linear atelectasis. IMPRESSION: 1. Approximately 30% right pneumothorax, partially loculated. 2. Less than 5% left apical pneumothorax. 3. Multiple bilateral lung masses and right hilar  mass with progression since 12/26/2023. 4. Right upper lobe zone subsegmental atelectasis/postobstructive changes and mild right basilar linear atelectasis. Electronically Signed   By: Beckie Salts M.D.   On: 01/20/2024 15:18   CT Soft Tissue Neck W Contrast Addendum Date: 01/20/2024 ADDENDUM REPORT: 01/20/2024 14:41 ADDENDUM: These results were called by telephone at the time of interpretation on 01/20/2024 at 2:21 pm to provider Jonny Ruiz, PA, who verbally acknowledged these results. Electronically Signed   By: Emily Filbert M.D.   On: 01/20/2024 14:41   Result Date: 01/20/2024 CLINICAL DATA:  Burning sensation in back of throat, spit out blood this morning. Sore throat for a week. EXAM: CT NECK WITH CONTRAST TECHNIQUE: Multidetector CT imaging of the neck was performed using the standard protocol following the bolus administration of intravenous contrast. RADIATION DOSE REDUCTION:  This exam was performed according to the departmental dose-optimization program which includes automated exposure control, adjustment of the mA and/or kV according to patient size and/or use of iterative reconstruction technique. CONTRAST:  OMNIPAQUE IOHEXOL 300 MG/ML  SOLN COMPARISON:  CTA head and neck 09/23/2023. FINDINGS: Suprahyoid neck: Interval increase in size of heterogeneous mass involving the region of the left palatine tonsil measuring 2.6 x 2.6 x 3.2 cm, previously measuring 1.6 x 2.1 x 2.3 cm. The mass demonstrates prominent nodular areas of enhancement slightly increased since the prior study. The mass extends along the left lateral aspect of the oropharynx and partially involves the left posterior aspect of the soft palate. Mass also extends inferiorly to involve/abut the left base of tongue. There is resulting mild narrowing and rightward displacement of the or pharyngeal airway which is increased from prior. The nasopharynx is symmetric. The floor of mouth is unremarkable. Normal appearance of the epiglottis.  Retropharynx is unremarkable. Infrahyoid neck: No mass or swelling. Aryepiglottic folds and pyriform sinuses are symmetric. Vocal folds are symmetric. Salivary glands: The parotid and submandibular glands are symmetric. No inflammation, mass, or calcification. Lymph nodes: Interval development of an enlarged heterogeneous left level 2A cervical node measuring 2.0 x 1.3 x 2.4 cm. The no demonstrates nodular areas of enhancement and additional areas of central hypoattenuation which could reflect necrosis similar in appearance to the left tonsillar mass. No enlarged right cervical lymph nodes. Thyroid: 1.6 cm hypoattenuating nodule along the lateral aspect of the right thyroid lobe. Vascular: Moderate atherosclerosis of the thoracic aorta. Additional mild atherosclerosis at the right carotid bifurcation. Limited intracranial: Atherosclerosis of the intracranial ICAs. Visualized intracranial structures otherwise unremarkable. Visualized orbits: Orbits are symmetric. Mastoids and visualized paranasal sinuses: Visualized paranasal sinuses are clear. Mastoid air cells are clear. Skeleton: No acute or aggressive lesion. Upper chest: Redemonstrated dominant mass in the right upper lobe involving the pleura which is overall similar to 12/26/2023. Similar appearance of right hilar mass. Additional scattered masses in the visualized lungs are similar to prior. There is interval development of pneumothorax bilaterally more pronounced on the right. Left chest wall Port-A-Cath partially visualized. IMPRESSION: Interval increase in size of heterogeneous mass involving the left palatine tonsil now measuring up to 3.2 cm, previously 2.3 cm. Mass abuts and possibly involves the soft palate and left aspect of the base of tongue. Mild narrowing and rightward deviation of the oropharyngeal airway is increased from prior. Interval development of enlarged enhancing left level 2A cervical node with similar characteristics to the left  tonsillar mass. Similar appearance of bilateral pulmonary masses and right hilar mass compared to CT on 12/26/2023. Interval development of bilateral pneumothoraces, right greater than left. 1.6 cm right thyroid nodule. Recommend correlation with nonemergent thyroid ultrasound. Electronically Signed: By: Emily Filbert M.D. On: 01/20/2024 14:05    ZOX:[id number] other than stated per HPI  Blood pressure 116/75, pulse (!) 114, temperature 98.3 F (36.8 C), resp. rate 18, height 5\' 5"  (1.651 m), weight 57.2 kg, SpO2 93%.  PHYSICAL EXAM:  Adult female , frail, resting in gurney in NAD. During initial interview began spitting up bright red blood. Oral cavity / oropharynx exam notbale for large left tonsillar tumor ~4cm in size with sites of active bleeding. Tumor is not obstructing oropharyngeal airway. Stopped after several minutes. Anterior neck soft/supple. Palpable airway landmarks I.e. cricothyroid membrane.   Studies Reviewed: CT Neck with contrast 01/20/24  FINDINGS: Suprahyoid neck: Interval increase in size of heterogeneous mass involving the region of  the left palatine tonsil measuring 2.6 x 2.6 x 3.2 cm, previously measuring 1.6 x 2.1 x 2.3 cm. The mass demonstrates prominent nodular areas of enhancement slightly increased since the prior study. The mass extends along the left lateral aspect of the oropharynx and partially involves the left posterior aspect of the soft palate. Mass also extends inferiorly to involve/abut the left base of tongue. There is resulting mild narrowing and rightward displacement of the or pharyngeal airway which is increased from prior. The nasopharynx is symmetric. The floor of mouth is unremarkable. Normal appearance of the epiglottis. Retropharynx is unremarkable.   Infrahyoid neck: No mass or swelling. Aryepiglottic folds and pyriform sinuses are symmetric. Vocal folds are symmetric.   Salivary glands: The parotid and submandibular glands are  symmetric. No inflammation, mass, or calcification.   Lymph nodes: Interval development of an enlarged heterogeneous left level 2A cervical node measuring 2.0 x 1.3 x 2.4 cm. The no demonstrates nodular areas of enhancement and additional areas of central hypoattenuation which could reflect necrosis similar in appearance to the left tonsillar mass. No enlarged right cervical lymph nodes.   Thyroid: 1.6 cm hypoattenuating nodule along the lateral aspect of the right thyroid lobe.   Vascular: Moderate atherosclerosis of the thoracic aorta. Additional mild atherosclerosis at the right carotid bifurcation.   Limited intracranial: Atherosclerosis of the intracranial ICAs. Visualized intracranial structures otherwise unremarkable.   Visualized orbits: Orbits are symmetric.   Mastoids and visualized paranasal sinuses: Visualized paranasal sinuses are clear. Mastoid air cells are clear.   Skeleton: No acute or aggressive lesion.   Upper chest: Redemonstrated dominant mass in the right upper lobe involving the pleura which is overall similar to 12/26/2023. Similar appearance of right hilar mass. Additional scattered masses in the visualized lungs are similar to prior. There is interval development of pneumothorax bilaterally more pronounced on the right. Left chest wall Port-A-Cath partially visualized.   IMPRESSION: Interval increase in size of heterogeneous mass involving the left palatine tonsil now measuring up to 3.2 cm, previously 2.3 cm. Mass abuts and possibly involves the soft palate and left aspect of the base of tongue. Mild narrowing and rightward deviation of the oropharyngeal airway is increased from prior.   Interval development of enlarged enhancing left level 2A cervical node with similar characteristics to the left tonsillar mass.   Similar appearance of bilateral pulmonary masses and right hilar mass compared to CT on 12/26/2023. Interval development of  bilateral pneumothoraces, right greater than left.   1.6 cm right thyroid nodule. Recommend correlation with nonemergent thyroid ultrasound.   Electronically Signed: By: Emily Filbert M.D. 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 Intermittent bleeding during my history and exam. I consider this to be an end stage progression of head and neck tonsillar cancer which creates an immediate threat to the patient's health and quality of life by placing her at high risk of exsanguination and/or asphyxiation.   Recommendations: -I recommend urgent interventional radiology consultation evaluation for possible IR embolization of feeding vessels to the left tonsillar tumor.  This is the primary palliative procedural intervention that may reduce bleeding/hemorrhage.  For brisk bleeding affecting airway patient may require intubation and throat packing.   - Palliative care and Hospice consults -  CCM transfer to [facility] in preparation for procedure (Discussed with Neuro-IR Dr. Corliss Skains who stated patient would require stabilization by ccm and [facility] for [facility] tomorrow if eligible) - I discussed my recommendations with CCM Dr. Marchelle Gearing   Dispo: CCM   Medical Decision Making: 4 - High  #/Compl Problems  4                         Data Rev  4                 Management 4             (1-Straightforward, 2-Low, 3- Moderate, 4-High)   Electronically signed by:  Scarlette Ar, MD  Staff Physician Facial Plastic & Reconstructive Surgery Otolaryngology - Head and Neck Surgery [facility]   01/20/2024, 4:45 PM

## 2024-01-20 NOTE — H&P (Signed)
 NAME:  Rebecca Ochoa, MRN:  [id number], DOB:  Mar 24, 1961, LOS: 0 ADMISSION DATE:  01/20/2024, CONSULTATION DATE:  12/31/23 REFERRING MD:  Dr Shawna Orleans BELFI, CHIEF COMPLAINT:  ORal BLeeding from tonsillar mass *. Also R > L Loculated Ptx. Known lung cancer  PCP Patient, No Pcp Per Onc:L Mohamed Primary Pulm:  [place]  History of Present Illness:   63 y.o. 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  Her most recent cancer center/oncology visit was yesterday January 19, 2024 for management of multiple chemotherapy side effects.   She presented to the [facility] emergency room today with throat bleeding reported as signiicant - half coffee cup in morning. And int he ER when she drank water coughed blood again. .  ED workup notable for large left tonsillar tumor and necrotic lymph nodes on CT neck.  In addition patient found to have bilateral pneumothoraces R > L with R 30% but loculated. No distress. And Pulse 93% RA at rest - Pneumothorax is new compared to CT a month earlier with oncology  Noted to have high tumor burden that is progressing  PE Nov 2024 on eliquis Targisso stopped due to long QTc   Past Medical History:    has a past medical history of Anemia, Asthma, mild intermittent, well-controlled, GERD (gastroesophageal reflux disease), migraines, Hyperlipidemia, Hypertension, and Vitamin D deficiency.   reports that she has quit smoking. Her smoking use included e-cigarettes. She has never used smokeless tobacco.  Past Surgical History:  Procedure Laterality Date   BREAST SURGERY     reduction   BRONCHIAL BIOPSY  08/08/2023   Procedure: BRONCHIAL BIOPSIES;  Surgeon: Leslye Peer, MD;  Location: [facility] ENDOSCOPY;  Service: Pulmonary;;   BRONCHIAL BRUSHINGS  08/08/2023   Procedure: BRONCHIAL  BRUSHINGS;  Surgeon: Leslye Peer, MD;  Location: [facility] ENDOSCOPY;  Service: Pulmonary;;   BRONCHIAL NEEDLE ASPIRATION BIOPSY  08/08/2023   Procedure: BRONCHIAL NEEDLE ASPIRATION BIOPSIES;  Surgeon: Leslye Peer, MD;  Location: [facility] ENDOSCOPY;  Service: Pulmonary;;   ENDOBRONCHIAL ULTRASOUND Bilateral 08/08/2023   Procedure: ENDOBRONCHIAL ULTRASOUND;  Surgeon: Leslye Peer, MD;  Location: [facility] ENDOSCOPY;  Service: Pulmonary;  Laterality: Bilateral;   HEMOSTASIS CONTROL  08/08/2023   Procedure: HEMOSTASIS CONTROL;  Surgeon: Leslye Peer, MD;  Location: [facility] ENDOSCOPY;  Service: Pulmonary;;   IR IMAGING GUIDED PORT INSERTION  09/01/2023   REDUCTION MAMMAPLASTY     TUBAL LIGATION      Allergies  Allergen Reactions   Biaxin [Clarithromycin] Nausea Only    Immunization History  Administered Date(s) Administered   PPD Test 12/08/2014   Pneumococcal Polysaccharide-23 10/31/1996   Td 10/31/2009   Tdap 10/12/2020    Family History  Problem Relation Age of Onset   Heart disease Father    Hypertension Father    Diabetes Father    Kidney disease Father    Cancer Brother      Current Facility-Administered Medications:    tranexamic acid (CYKLOKAPRON) 1000 MG/10ML nebulizer solution 500 mg, 500 mg, Nebulization, Once, Rolan Bucco, MD  Current Outpatient Medications:    acetaminophen (TYLENOL) 500 MG tablet, Take 1,000 mg by mouth every 8 (eight) hours as needed for mild pain., Disp: , Rfl:    ALPRAZolam (XANAX) 0.5 MG tablet, Take 1 tablet (0.5 mg total) by mouth 3 (three) times daily as needed for anxiety. (  Patient taking differently: Take 0.5 mg by mouth in the morning.), Disp: 60 tablet, Rfl: 0   apixaban (ELIQUIS) 5 MG TABS tablet, Take 1 tablet (5 mg total) by mouth 2 (two) times daily., Disp: 60 tablet, Rfl: 1   cyanocobalamin (VITAMIN B12) 500 MCG tablet, Take 1 tablet (500 mcg total) by mouth daily., Disp: 100 tablet, Rfl: 0   dexamethasone (DECADRON) 4 MG tablet, Take 1 tablet  twice a day the day before, the day of, and the day after chemotherapy, Disp: 60 tablet, Rfl: 1   empagliflozin (JARDIANCE) 10 MG TABS tablet, Take 1 tablet (10 mg total) by mouth daily., Disp: 90 tablet, Rfl: 0   erlotinib (TARCEVA) 100 MG tablet, Take 1 tablet (100 mg total) by mouth daily. Take on an empty stomach 1 hour before meals or 2 hours after, Disp: 30 tablet, Rfl: 3   famotidine (PEPCID) 20 MG tablet, Take 1 tablet (20 mg total) by mouth at bedtime., Disp: 30 tablet, Rfl: 2   folic acid (FOLVITE) 1 MG tablet, TAKE 1 TABLET BY MOUTH EVERY DAY, Disp: 90 tablet, Rfl: 0   HYDROcodone-acetaminophen (NORCO/VICODIN) 5-325 MG tablet, Take 1 tablet by mouth every 6 (six) hours as needed. (Patient taking differently: Take 1 tablet by mouth every 6 (six) hours as needed (for pain).), Disp: 21 tablet, Rfl: 0   loperamide (IMODIUM) 2 MG capsule, Take 1 capsule (2 mg total) by mouth as needed for diarrhea or loose stools., Disp: 30 capsule, Rfl: 0   losartan (COZAAR) 25 MG tablet, Take 0.5 tablets (12.5 mg total) by mouth daily. (Patient not taking: Reported on 01/11/2024), Disp: 90 tablet, Rfl: 0   metoprolol succinate (TOPROL XL) 100 MG 24 hr tablet, Take 1 tablet (100 mg total) by mouth in the morning and at bedtime. Take with or immediately following a meal., Disp: 180 tablet, Rfl: 0   nystatin (MYCOSTATIN/NYSTOP) powder, Apply 1 Application topically 3 (three) times daily. Apply to under breast for rash if needed., Disp: 15 g, Rfl: 0   potassium chloride SA (KLOR-CON M) 20 MEQ tablet, Take 1 tablet (20 mEq total) by mouth daily for 14 days., Disp: 14 tablet, Rfl: 0   prochlorperazine (COMPAZINE) 10 MG tablet, Take 1 tablet (10 mg total) by mouth every 6 (six) hours as needed for nausea or vomiting., Disp: 60 tablet, Rfl: 6   Simethicone (GAS-X PO), Take 2 tablets by mouth daily as needed., Disp: , Rfl:    temazepam (RESTORIL) 30 MG capsule, Take 1 capsule (30 mg total) by mouth at bedtime as needed  for sleep. (Patient taking differently: Take 30 mg by mouth at bedtime.), Disp: 30 capsule, Rfl: 3  Facility-Administered Medications Ordered in Other Encounters:    0.9 %  sodium chloride infusion (Manually program via Guardrails IV Fluids), 250 mL, Intravenous, Continuous, Heilingoetter, Cassandra L, PA-C, Stopped at 12/13/23 1658   0.9 %  sodium chloride infusion, , Intravenous, Continuous, Si Gaul, MD, Stopped at 12/13/23 1526     Significant Hospital Events:  01/20/2024 - admit. Seen in [place] ER 16    Objective   Blood pressure 116/75, pulse (!) 114, temperature 98.3 F (36.8 C), resp. rate 18, height 5\' 5"  (1.651 m), weight 57.2 kg, SpO2 93%.       No intake or output data in the 24 hours ending 01/20/24 1704 Filed Weights   01/20/24 0959  Weight: 57.2 kg    Examination: General: very frail and cachectic . No disress HENT:  LARGE NECROTIC  LEFT TONSIL Lungs: Mild decrease sir entry right side. No distress. Pulse ox 93% RA Cardiovascular: Normal heart sounds Abdomen: Soft Extremities: Intact Neuro: AxXOx 3. Speech normal GU:    Resolved Hospital Problem list   x  Assessment & Plan:   A:  Bilatgeral R > LEft Pneumothorax   - LEft sliver of Ptx  - Rt 30% loculated due to tumor burden  and new ins 3-4 weeks  - Not in distress with pusle ox 93% RA atrest    P:   O2 based monitoring  CXR repeat 9pm 3/22 and 5am 01/21/24 Chest tube if worsens OR after addressing ENT issues (see below) - whichever comes first   Hx of PE Nov 2024 on eliquis - las dose eliquis PM 01/19/24 No with Tonsillar mass bleeding  Plan   - ANDEXXA stat   ADvancd lung cancer - Stage IVB ( T3, N2, M1c) non-Small Cell Lung Cancer, poorly differentiated squamous cell carcinoma  - Undergoing Tarceva treatment with side effects impacting quality of lif arceva with concurrent systemic chemotherapy with carboplatin for AUC of 5 and Alimta 500 Mg/M2 . Most recent: Day 1   Cycle 2 on  12/13/23  - HIGH TUMOR BURDEN  - Intoelrance ti various chemo  Plan  - holding tarceva  TONSILLAR MASS LEFT -known since late 2024 Now with BLEEDING - significant  Plan  - txa q8h x 24h  - ANdexxa  - ENT consult - > on phone they recommended IR consut -> neuro IR Dr Corliss Skains wants patioent at [facility] per EDP - avoid oRal irritiaon - coughs up blood   ANEMIA  Plan  =- PRBC for hgb </= 6.9gm%    - exceptions are   -  if ACS susepcted/confirmed then transfuse for hgb </= 8.0gm%,  or    -  active bleeding with hemodynamic instability, then transfuse regardless of hemoglobin value   At at all times try to transfuse 1 unit prbc as possible with exception of active hemorrhage   Hypomagnesemia  Plan  - replet   MSK/DERM Frailty Cachexia Fatigue ANorexa Nausea  Plan  - sympotoms stupport    Best practice (daily eval):  Diet: NPO )I - start IVF Pain/Anxiety/Delirium protocol (if indicated): x VAP protocol (if indicated): x DVT prophylaxis: SCD GI prophylaxis: PPI IV Glucose control: na Mobility: bed rest Code Status: full code Family Communication: patient and boy friend at bedsie in ER Disposition: Move trom [place] ER to [facility] ICU  -> Anders Simmonds APP at [facility] updated   Goals of Care:    Interdisciplinary Goals of Care Family Meeting   Date carried out:: 01/20/2024  Location of the meeting: Bedside  Member's involved: Physician, Family Member or next of kin, and Other: her boryfirnd  Counsellor: patient but unclear if BF is DPOA    Discussion: We discussed goals of care for [employer] .   - recommended NO CPR  Code status: Full Code  Disposition: Continue current acute care   Time spent for the meeting: 15 min  Capri Raben 01/20/2024, 6:09 PM      ATTESTATION & SIGNATURE   The patient Tuwana Kapaun is critically ill with multiple organ systems failure and requires high  complexity decision making for assessment and support, frequent evaluation and titration of therapies, application of advanced monitoring technologies and extensive interpretation of multiple databases.   Critical Care Time devoted to patient care services described in  this note is  60  Minutes. This time reflects time of care of this signee Dr Kalman Shan. This critical care time does not reflect procedure time, or teaching time or supervisory time of PA/NP/Med student/Med Resident etc but could involve care discussion time      SIGNATURE    Dr. Kalman Shan, M.D., F.C.C.P,  Pulmonary and Critical Care Medicine Staff Physician, [facility] Center Director - Interstitial Lung Disease  Program  Pulmonary Fibrosis [facility] at [facility] [place], [place], [address]  NPI Number:  NPI #[id number]  Pager: [phone], If no answer  -> Check AMION or Try [phone] Telephone (clinical office): [phone] Telephone (research): [phone]  5:04 PM 01/20/2024   01/20/2024 5:04 PM    LABS    PULMONARY No results for input(s): "PHART", "PCO2ART", "PO2ART", "HCO3", "TCO2", "O2SAT" in the last 168 hours.  Invalid input(s): "PCO2", "PO2"  CBC Recent Labs  Lab 01/19/24 1420 01/20/24 1110  HGB 9.2* 8.0*  HCT 28.5* 26.0*  WBC 10.6* 8.7  PLT 373 283    COAGULATION No results for input(s): "INR" in the last 168 hours.  CARDIAC  No results for input(s): "TROPONINI" in the last 168 hours. No results for input(s): "PROBNP" in the last 168 hours.  CHEMISTRY Recent Labs  Lab 01/19/24 1420 01/20/24 1110  NA 136 135  K 3.4* 3.2*  CL 98 103  CO2 27 22  GLUCOSE 96 90  BUN 14 13  CREATININE 0.79 0.76  CALCIUM 9.6 8.6*  MG 1.5*  --    Estimated Creatinine Clearance: 64.8 mL/min (by C-G formula based on SCr of 0.76 mg/dL).   LIVER Recent Labs  Lab 01/19/24 1420  AST 60*  ALT 28  ALKPHOS 133*  BILITOT 1.1  PROT 7.7   ALBUMIN 3.2*     INFECTIOUS No results for input(s): "LATICACIDVEN", "PROCALCITON" in the last 168 hours.   ENDOCRINE CBG (last 3)  No results for input(s): "GLUCAP" in the last 72 hours.       IMAGING x48h  - image(s) personally visualized  -   highlighted in bold CT CHEST WO CONTRAST Result Date: 01/20/2024 CLINICAL DATA:  Non-small cell lung cancer, bilateral pneumothoraces. * Tracking Code: BO * EXAM: CT CHEST WITHOUT CONTRAST TECHNIQUE: Multidetector CT imaging of the chest was performed following the standard protocol without IV contrast. RADIATION DOSE REDUCTION: This exam was performed according to the departmental dose-optimization program which includes automated exposure control, adjustment of the mA and/or kV according to patient size and/or use of iterative reconstruction technique. COMPARISON:  Neck CT 01/20/2024 and CTA chest 12/26/2023 FINDINGS: Cardiovascular: Left Port-A-Cath tip: Cavoatrial junction. Coronary, aortic arch, and branch vessel atherosclerotic vascular disease. Mediastinum/Nodes: Aside from the right hilar mass there is little in the way of thoracic adenopathy. Lungs/Pleura: 30% right pneumothorax. Some of the right upper lobe may be tethered to the pleura by a peripheral 3.6 cm mass on image 28 series 4. Small amount of complex pleural fluid making this a hydropneumothorax. 10% left pneumothorax noted likewise with some dependent pleural density potentially from trace pleural fluid and with some loculated pneumothorax posteriorly along the posterior costophrenic angle. Scattered nodules and masses in both lungs. Dominant right hilar mass about 4.3 cm in diameter. Upper Abdomen: Abdominal aortic atherosclerosis. Indistinct masses in the left hepatic lobe. Pathologic peripancreatic node 1.5 cm in short axis, image 50 series 2. Musculoskeletal: Mild thoracic kyphosis. IMPRESSION: 1. 30% right hydropneumothorax.  Some of the right upper lobe may be tethered to the  pleura by a peripheral 3.6 cm mass. 2. 10% left pneumothorax with some dependent pleural density potentially from trace pleural fluid and with some loculated pneumothorax posteriorly along the posterior costophrenic angle. 3. Scattered nodules and masses in both lungs, compatible with metastatic disease. Dominant right hilar mass about 4.3 cm in diameter. 4. Indistinct masses in the left hepatic lobe, compatible with metastatic disease. Stable pathologic peripancreatic adenopathy. 5. Coronary, aortic arch, and branch vessel atherosclerotic vascular disease. Aortic Atherosclerosis (ICD10-I70.0). 6. Mild thoracic kyphosis. Electronically Signed   By: Gaylyn Rong M.D.   On: 01/20/2024 16:54   DG Chest Port 1 View Result Date: 01/20/2024 CLINICAL DATA:  Bilateral pneumothoraces seen on a neck CT with contrast earlier today. Non-small-cell lung cancer. EXAM: PORTABLE CHEST 1 VIEW COMPARISON:  Neck CT with contrast obtained earlier today. Portable chest dated 12/26/2023. FINDINGS: Normal sized heart. Approximately 30% right apical, lateral and basilar pneumothorax. This is partially loculated. Less than 5% left apical pneumothorax. Multiple bilateral lung masses and right hilar mass with progression since 12/26/2023. Right upper lobe atelectasis/postobstructive changes unchanged from the neck CT earlier today. Mild right basilar linear atelectasis. No mediastinal shift. Minimal bilateral glenohumeral degenerative changes. Right upper lobe zone subsegmental atelectasis, postobstructive changes and mild right basilar linear atelectasis. IMPRESSION: 1. Approximately 30% right pneumothorax, partially loculated. 2. Less than 5% left apical pneumothorax. 3. Multiple bilateral lung masses and right hilar mass with progression since 12/26/2023. 4. Right upper lobe zone subsegmental atelectasis/postobstructive changes and mild right basilar linear atelectasis. Electronically Signed   By: Beckie Salts M.D.   On:  01/20/2024 15:18   CT Soft Tissue Neck W Contrast Addendum Date: 01/20/2024 ADDENDUM REPORT: 01/20/2024 14:41 ADDENDUM: These results were called by telephone at the time of interpretation on 01/20/2024 at 2:21 pm to provider Jonny Ruiz, PA, who verbally acknowledged these results. Electronically Signed   By: Emily Filbert M.D.   On: 01/20/2024 14:41   Result Date: 01/20/2024 CLINICAL DATA:  Burning sensation in back of throat, spit out blood this morning. Sore throat for a week. EXAM: CT NECK WITH CONTRAST TECHNIQUE: Multidetector CT imaging of the neck was performed using the standard protocol following the bolus administration of intravenous contrast. RADIATION DOSE REDUCTION: This exam was performed according to the departmental dose-optimization program which includes automated exposure control, adjustment of the mA and/or kV according to patient size and/or use of iterative reconstruction technique. CONTRAST:  OMNIPAQUE IOHEXOL 300 MG/ML  SOLN COMPARISON:  CTA head and neck 09/23/2023. FINDINGS: Suprahyoid neck: Interval increase in size of heterogeneous mass involving the region of the left palatine tonsil measuring 2.6 x 2.6 x 3.2 cm, previously measuring 1.6 x 2.1 x 2.3 cm. The mass demonstrates prominent nodular areas of enhancement slightly increased since the prior study. The mass extends along the left lateral aspect of the oropharynx and partially involves the left posterior aspect of the soft palate. Mass also extends inferiorly to involve/abut the left base of tongue. There is resulting mild narrowing and rightward displacement of the or pharyngeal airway which is increased from prior. The nasopharynx is symmetric. The floor of mouth is unremarkable. Normal appearance of the epiglottis. Retropharynx is unremarkable. Infrahyoid neck: No mass or swelling. Aryepiglottic folds and pyriform sinuses are symmetric. Vocal folds are symmetric. Salivary glands: The parotid and submandibular glands are  symmetric. No inflammation, mass, or calcification. Lymph nodes: Interval development of an enlarged heterogeneous left level 2A  cervical node measuring 2.0 x 1.3 x 2.4 cm. The no demonstrates nodular areas of enhancement and additional areas of central hypoattenuation which could reflect necrosis similar in appearance to the left tonsillar mass. No enlarged right cervical lymph nodes. Thyroid: 1.6 cm hypoattenuating nodule along the lateral aspect of the right thyroid lobe. Vascular: Moderate atherosclerosis of the thoracic aorta. Additional mild atherosclerosis at the right carotid bifurcation. Limited intracranial: Atherosclerosis of the intracranial ICAs. Visualized intracranial structures otherwise unremarkable. Visualized orbits: Orbits are symmetric. Mastoids and visualized paranasal sinuses: Visualized paranasal sinuses are clear. Mastoid air cells are clear. Skeleton: No acute or aggressive lesion. Upper chest: Redemonstrated dominant mass in the right upper lobe involving the pleura which is overall similar to 12/26/2023. Similar appearance of right hilar mass. Additional scattered masses in the visualized lungs are similar to prior. There is interval development of pneumothorax bilaterally more pronounced on the right. Left chest wall Port-A-Cath partially visualized. IMPRESSION: Interval increase in size of heterogeneous mass involving the left palatine tonsil now measuring up to 3.2 cm, previously 2.3 cm. Mass abuts and possibly involves the soft palate and left aspect of the base of tongue. Mild narrowing and rightward deviation of the oropharyngeal airway is increased from prior. Interval development of enlarged enhancing left level 2A cervical node with similar characteristics to the left tonsillar mass. Similar appearance of bilateral pulmonary masses and right hilar mass compared to CT on 12/26/2023. Interval development of bilateral pneumothoraces, right greater than left. 1.6 cm right thyroid  nodule. Recommend correlation with nonemergent thyroid ultrasound. Electronically Signed: By: Emily Filbert M.D. On: 01/20/2024 14:05

## 2024-01-21 ENCOUNTER — Inpatient Hospital Stay (HOSPITAL_COMMUNITY)

## 2024-01-21 DIAGNOSIS — J939 Pneumothorax, unspecified: Secondary | ICD-10-CM | POA: Diagnosis not present

## 2024-01-21 DIAGNOSIS — J358 Other chronic diseases of tonsils and adenoids: Secondary | ICD-10-CM | POA: Diagnosis not present

## 2024-01-21 DIAGNOSIS — D490 Neoplasm of unspecified behavior of digestive system: Secondary | ICD-10-CM | POA: Diagnosis not present

## 2024-01-21 LAB — BASIC METABOLIC PANEL
Anion gap: 10 (ref 5–15)
BUN: 11 mg/dL (ref 8–23)
CO2: 23 mmol/L (ref 22–32)
Calcium: 8.6 mg/dL — ABNORMAL LOW (ref 8.9–10.3)
Chloride: 102 mmol/L (ref 98–111)
Creatinine, Ser: 0.85 mg/dL (ref 0.44–1.00)
GFR, Estimated: >60 mL/min (ref >60)
Glucose, Bld: 93 mg/dL (ref 70–99)
Potassium: 3 mmol/L — ABNORMAL LOW (ref 3.5–5.1)
Sodium: 135 mmol/L (ref 135–145)

## 2024-01-21 LAB — GLUCOSE, CAPILLARY
Glucose-Capillary: 99 mg/dL (ref 70–99)
Glucose-Capillary: 84 mg/dL (ref 70–99)
Glucose-Capillary: 95 mg/dL (ref 70–99)
Glucose-Capillary: 100 mg/dL — ABNORMAL HIGH (ref 70–99)
Glucose-Capillary: 100 mg/dL — ABNORMAL HIGH (ref 70–99)

## 2024-01-21 LAB — CBC
HCT: 21.6 % — ABNORMAL LOW (ref 36.0–46.0)
Hemoglobin: 7 g/dL — ABNORMAL LOW (ref 12.0–15.0)
MCH: 28.3 pg (ref 26.0–34.0)
MCHC: 32.4 g/dL (ref 30.0–36.0)
MCV: 87.4 fL (ref 80.0–100.0)
Platelets: 252 10*3/uL (ref 150–400)
RBC: 2.47 MIL/uL — ABNORMAL LOW (ref 3.87–5.11)
RDW: 19.2 % — ABNORMAL HIGH (ref 11.5–15.5)
WBC: 8.4 10*3/uL (ref 4.0–10.5)
nRBC: 0 % (ref 0.0–0.2)

## 2024-01-21 LAB — PROTIME-INR
INR: 1.8 — ABNORMAL HIGH (ref 0.8–1.2)
INR: 1.8 — ABNORMAL HIGH (ref 0.8–1.2)
Prothrombin Time: 21.4 s — ABNORMAL HIGH (ref 11.4–15.2)
Prothrombin Time: 21 s — ABNORMAL HIGH (ref 11.4–15.2)

## 2024-01-21 LAB — PHOSPHORUS: Phosphorus: 3.5 mg/dL (ref 2.5–4.6)

## 2024-01-21 LAB — MAGNESIUM: Magnesium: 3.1 mg/dL — ABNORMAL HIGH (ref 1.7–2.4)

## 2024-01-21 MED ORDER — MORPHINE SULFATE (PF) 2 MG/ML IV SOLN
2.0000 mg | INTRAVENOUS | Status: DC | PRN
  Administered 2024-01-21 – 2024-01-26 (×10): 2 mg via INTRAVENOUS
  Filled 2024-01-21 (×10): qty 1

## 2024-01-21 MED ORDER — METOPROLOL TARTRATE 5 MG/5ML IV SOLN
5.0000 mg | Freq: Four times a day (QID) | INTRAVENOUS | Status: DC
  Administered 2024-01-21 – 2024-01-23 (×8): 5 mg via INTRAVENOUS
  Filled 2024-01-21 (×8): qty 5

## 2024-01-21 MED ORDER — POTASSIUM CHLORIDE 10 MEQ/50ML IV SOLN
10.0000 meq | INTRAVENOUS | Status: AC
  Administered 2024-01-21 (×6): 10 meq via INTRAVENOUS
  Filled 2024-01-21 (×6): qty 50

## 2024-01-21 MED ORDER — DEXTROSE IN LACTATED RINGERS 5 % IV SOLN
INTRAVENOUS | Status: AC
Start: 2024-01-21 — End: 2024-01-22

## 2024-01-21 NOTE — Progress Notes (Addendum)
 eLink Physician-Brief Progress Note Patient Name: Rebecca Ochoa DOB: 09-22-1961 MRN: [id number]   Date of Service  01/21/2024  HPI/Events of Note  63 year old female with a extensive tonsillar mass undergoing evaluation and potential intervention.  Thus far, no intervention anticipated.  Currently running maintenance IV fluids which are now expiring.  eICU Interventions  Renew IV fluids until plan for advancing her diet is established   2045 -currently n.p.o. on dextrose infusion.  No history of diabetes.  Track CBGs for 24 hours.  Intervention Category Minor Interventions: Routine modifications to care plan (e.g. PRN medications for pain, fever)  Cade Dashner 01/21/2024, 7:52 PM

## 2024-01-21 NOTE — Plan of Care (Signed)

## 2024-01-21 NOTE — Progress Notes (Signed)
 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  Pain well controlled Husband at bedside   Objective: Vital signs in last 24 hours: Temp:  [97.8 F (36.6 C)-98.7 F (37.1 C)] 98.7 F (37.1 C) (03/23 0800) Pulse Rate:  [100-134] 117 (03/23 0730) Resp:  [18-34] 28 (03/23 0730) BP: (86-145)/(54-82) 105/62 (03/23 0730) SpO2:  [89 %-95 %] 91 % (03/23 0730) Weight:  [57.2 kg] 57.2 kg (03/22 0959)  Physical exam: General: resting comfortably in NAD Oral cavity/oropharynx without active bleeding Palpable left level II adenopathy ~2-3cm  @LABLAST2 (wbc:2,hgb:2,hct:2,plt:2) Recent Labs    01/20/24 1110 01/21/24 0245  NA 135 135  K 3.2* 3.0*  CL 103 102  CO2 22 23  GLUCOSE 90 93  BUN 13 11  CREATININE 0.76 0.85  CALCIUM 8.6* 8.6*    Medications: I have reviewed the patient's current medications.  Assessment/Plan: Throat hemorrhage from tonsillar cancer. Patient with some brisk bleeding in the ED yesterday and a blood clot produced overnight. No active bleeding at the moment.   Recommendations: - If eligible and cleared medically recommend IR embolization of the left tonsillar cancer; there is ongoing high risk of recurrent bleeding. I have high concern that these bleeding episodes are sentinel bleeds of a larger hemorrhage in the future, which could cause catastrophic blood loss complicated by asphyxiation.  - Palliative and hospice care consultations - Goals of care discussions   Dispo: ICU   LOS: 1 day   Scarlette Ar 01/21/2024, 9:45 AM  I have personally spent 10 minutes involved in face-to-face and non-face-to-face activities for this patient on the  day of the visit.  Professional time spent includes the following activities, in addition to those noted in the documentation: preparing to see the patient (eg, review of tests), obtaining and/or reviewing separately obtained history, performing a medically appropriate examination and/or evaluation, counseling and educating the patient/family/caregiver, ordering medications, tests or procedures, referring and communicating with other healthcare professionals, documenting clinical information in the electronic or other health record, independently interpreting results and communicating results with the patient/family/caregiver, care coordination.  Electronically signed by:  Scarlette Ar, MD  Staff Physician Facial Plastic & Reconstructive Surgery Otolaryngology - Head and Neck Surgery [facility] [place]

## 2024-01-21 NOTE — Consult Note (Signed)
 Chief Complaint:  Hemorrhagic left tonsillar mass   Referring Provider(s): Dr. Lavinia Sharps (PCCM) / Dr. Harvie Junior (ENT)  Supervising Physician: Julieanne Cotton  Patient Status: [facility] - In-pt  History of Present Illness: Rebecca Ochoa is a 63 y.o. 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 States she had a large amount of bleeding for about 15 minutes which eventually stopped. Patient also noted to have bilateral pneumthoraces on CXR and CT. While in the ED, patient started bleeding again. She was given Andexxa while in the ED to reverse Eliquis. ENT consulted and recommended embolization with NIR Dr. Loni Beckwith. Patient was transferred to [facility] ICU at the request of Dr. Corliss Skains should the patient need any intervention.   Patient is currently resting in bed with boyfriend at the bedside. States that her throat is sore and admits to some discomfort when swallowing. Also with some chest pain on deep inspiration. Reports her last episode of bleeding was around 8:00pm on 01/20/24. INR is currently 1.8 down from 2.0 on admission. H/H 7.0/21.6 down from 8.0/26.0 on 01/20/24. Discussed with the patient that NIR team would like to continue with conservative management at this time as she is not actively bleeding; however, should she start to re-bleed we may proceed with possible embolization. Patient and boyfriend verbalized understanding and are agreeable to this plan. All questions answered at the bedside.  Patient is Full Code  Past Medical History:  Diagnosis Date   Anemia    Asthma, mild intermittent, well-controlled    GERD (gastroesophageal reflux disease)    Hx of migraines    Hyperlipidemia    Hypertension    Vitamin D deficiency     Past Surgical History:  Procedure Laterality Date   BREAST SURGERY     reduction   BRONCHIAL  BIOPSY  08/08/2023   Procedure: BRONCHIAL BIOPSIES;  Surgeon: Leslye Peer, MD;  Location: [facility] ENDOSCOPY;  Service: Pulmonary;;   BRONCHIAL BRUSHINGS  08/08/2023   Procedure: BRONCHIAL BRUSHINGS;  Surgeon: Leslye Peer, MD;  Location: [facility] ENDOSCOPY;  Service: Pulmonary;;   BRONCHIAL NEEDLE ASPIRATION BIOPSY  08/08/2023   Procedure: BRONCHIAL NEEDLE ASPIRATION BIOPSIES;  Surgeon: Leslye Peer, MD;  Location: [facility] ENDOSCOPY;  Service: Pulmonary;;   ENDOBRONCHIAL ULTRASOUND Bilateral 08/08/2023   Procedure: ENDOBRONCHIAL ULTRASOUND;  Surgeon: Leslye Peer, MD;  Location: [facility] ENDOSCOPY;  Service: Pulmonary;  Laterality: Bilateral;   HEMOSTASIS CONTROL  08/08/2023   Procedure: HEMOSTASIS CONTROL;  Surgeon: Leslye Peer, MD;  Location: [facility] ENDOSCOPY;  Service: Pulmonary;;   IR IMAGING GUIDED PORT INSERTION  09/01/2023   REDUCTION MAMMAPLASTY     TUBAL LIGATION      Allergies: Biaxin [clarithromycin]  Medications: Prior to Admission medications   Medication Sig Start Date End Date Taking? Authorizing Provider  acetaminophen (TYLENOL) 500 MG tablet Take 1,000 mg by mouth every 8 (eight) hours as needed for mild pain.   Yes [provider]  ALPRAZolam (XANAX) 0.5 MG tablet Take 1 tablet (0.5 mg total) by mouth 3 (three) times daily as needed for anxiety. Patient taking differently: Take 0.5 mg by mouth See admin instructions. Take 0.5 mg by mouth in the morning and an additional 0.5 mg once a day as needed for anxiety 12/13/23  Yes Pickenpack-Cousar, Arty Baumgartner, NP  apixaban (ELIQUIS) 5 MG TABS tablet Take 1 tablet (5 mg  total) by mouth 2 (two) times daily. 09/25/23  Yes Jonah Blue, MD  cyanocobalamin (VITAMIN B12) 500 MCG tablet Take 1 tablet (500 mcg total) by mouth daily. 10/23/23 01/31/24 Yes Pokhrel, Laxman, MD  empagliflozin (JARDIANCE) 10 MG TABS tablet Take 1 tablet (10 mg total) by mouth daily. 01/02/24  Yes Standley Brooking, MD  erlotinib (TARCEVA) 100 MG tablet Take 1 tablet  (100 mg total) by mouth daily. Take on an empty stomach 1 hour before meals or 2 hours after 12/14/23  Yes Si Gaul, MD  famotidine (PEPCID) 20 MG tablet Take 1 tablet (20 mg total) by mouth at bedtime. 12/13/23  Yes Pickenpack-Cousar, Arty Baumgartner, NP  folic acid (FOLVITE) 1 MG tablet TAKE 1 TABLET BY MOUTH EVERY DAY 12/22/23  Yes Heilingoetter, Cassandra L, PA-C  HYDROcodone bit-homatropine (HYCODAN) 5-1.5 MG/5ML syrup Take 5 mLs by mouth every 6 (six) hours as needed for cough.   Yes [provider]  HYDROcodone-acetaminophen (NORCO/VICODIN) 5-325 MG tablet Take 1 tablet by mouth every 6 (six) hours as needed. Patient taking differently: Take 1 tablet by mouth every 6 (six) hours as needed (for pain). 09/07/23  Yes Rhetta Mura, MD  loperamide (IMODIUM) 2 MG capsule Take 1 capsule (2 mg total) by mouth as needed for diarrhea or loose stools. 10/23/23  Yes Pokhrel, Laxman, MD  metoprolol succinate (TOPROL XL) 100 MG 24 hr tablet Take 1 tablet (100 mg total) by mouth in the morning and at bedtime. Take with or immediately following a meal. 01/02/24 01/01/25 Yes Standley Brooking, MD  prochlorperazine (COMPAZINE) 10 MG tablet Take 1 tablet (10 mg total) by mouth every 6 (six) hours as needed for nausea or vomiting. 12/15/23  Yes Pickenpack-Cousar, Arty Baumgartner, NP  Simethicone (GAS-X PO) Take 2 tablets by mouth daily as needed (for gas).   Yes [provider]  temazepam (RESTORIL) 30 MG capsule Take 1 capsule (30 mg total) by mouth at bedtime as needed for sleep. Patient taking differently: Take 30 mg by mouth at bedtime. 12/13/23  Yes Pickenpack-Cousar, Arty Baumgartner, NP  dexamethasone (DECADRON) 4 MG tablet Take 1 tablet twice a day the day before, the day of, and the day after chemotherapy Patient not taking: Reported on 01/20/2024 12/13/23   Heilingoetter, Cassandra L, PA-C  losartan (COZAAR) 25 MG tablet Take 0.5 tablets (12.5 mg total) by mouth daily. Patient not taking: Reported on  01/11/2024 01/02/24   Standley Brooking, MD  nystatin (MYCOSTATIN/NYSTOP) powder Apply 1 Application topically 3 (three) times daily. Apply to under breast for rash if needed. Patient not taking: Reported on 01/20/2024 11/27/23   Namon Cirri E, PA-C  potassium chloride SA (KLOR-CON M) 20 MEQ tablet Take 1 tablet (20 mEq total) by mouth daily for 14 days. Patient not taking: Reported on 01/20/2024 01/02/24 01/20/24  Standley Brooking, MD  bisoprolol-hydrochlorothiazide [facility]) 5-6.25 MG tablet Take  1 tablet  Daily  for BP 06/06/22 07/11/22  [name], MD     Family History  Problem Relation Age of Onset   Heart disease Father    Hypertension Father    Diabetes Father    Kidney disease Father    Cancer Brother     Social History   Socioeconomic History   Marital status: Media planner    Spouse name: Not on file   Number of children: Not on file   Years of education: Not on file   Highest education level: Not on file  Occupational History   Not  on file  Tobacco Use   Smoking status: Former    Types: E-cigarettes   Smokeless tobacco: Never   Tobacco comments:    Quit in October 2024.  11/09/2023 hfb  Vaping Use   Vaping status: Every Day  Substance and Sexual Activity   Alcohol use: Yes    Alcohol/week: 3.0 standard drinks of alcohol    Types: 3 Standard drinks or equivalent per week   Drug use: Never   Sexual activity: Not on file  Other Topics Concern   Not on file  Social History Narrative   Not on file   Social Drivers of Health   Financial Resource Strain: Not on file  Food Insecurity: No Food Insecurity (12/27/2023)   Hunger Vital Sign    Worried About Running Out of Food in the Last Year: Never true    Ran Out of Food in the Last Year: Never true  Transportation Needs: No Transportation Needs (12/27/2023)   PRAPARE - Administrator, Civil Service (Medical): No    Lack of Transportation (Non-Medical): No  Recent Concern: Transportation Needs  - Unmet Transportation Needs (10/17/2023)   PRAPARE - Administrator, Civil Service (Medical): Yes    Lack of Transportation (Non-Medical): No  Physical Activity: Not on file  Stress: Not on file  Social Connections: Not on file     Review of Systems  HENT:  Positive for sore throat.   Cardiovascular:  Positive for chest pain (with deep inspiration).  Neurological:  Positive for dizziness (when lying flat).  Patient denies any headache, abdominal pain, N/V, or fever/chills. All other systems are negative.   Vital Signs: BP 105/62   Pulse (!) 117   Temp 98.7 F (37.1 C) (Oral)   Resp (!) 28   Ht 5\' 5"  (1.651 m)   Wt 126 lb (57.2 kg)   SpO2 91%   BMI 20.97 kg/m     Physical Exam Vitals reviewed.  Constitutional:      Appearance: Normal appearance. She is ill-appearing.  HENT:     Head: Normocephalic and atraumatic.     Mouth/Throat:     Mouth: Mucous membranes are moist.     Pharynx: Oropharynx is clear.  Neck:     Trachea: No tracheal deviation.  Cardiovascular:     Rate and Rhythm: Regular rhythm. Tachycardia present.  Pulmonary:     Effort: Pulmonary effort is normal.     Breath sounds: Decreased breath sounds present.  Abdominal:     General: Abdomen is flat.     Palpations: Abdomen is soft.  Musculoskeletal:        General: Normal range of motion.     Cervical back: Normal range of motion.  Skin:    General: Skin is warm and dry.  Neurological:     General: No focal deficit present.     Mental Status: She is alert and oriented to person, place, and time. Mental status is at baseline.  Psychiatric:        Mood and Affect: Mood normal.        Behavior: Behavior normal.        Judgment: Judgment normal.     Imaging: DG Chest Port 1 View Result Date: 01/21/2024 CLINICAL DATA:  63 year old female with metastatic non-small cell lung cancer and right hydropneumothorax. EXAM: PORTABLE CHEST 1 VIEW COMPARISON:  Portable chest yesterday and  earlier. FINDINGS: Portable AP upright view at 0542 hours. Moderate sized right hydropneumothorax persists with  adhesion of right lung or lung tumor to the right upper and lateral pleura as before. Stable lung volumes and ventilation. Stable cardiac size and mediastinal contours. Stable left chest power port. Underlying rounded pulmonary metastases. Trace left hydropneumothorax no longer apparent. Negative visible bowel gas.  Stable visualized osseous structures. IMPRESSION: 1. Moderate right hydropneumothorax superimposed on right lung tumor and metastases stable from yesterday. 2. Smaller left hydropneumothorax no longer radiographically apparent. 3. No new cardiopulmonary abnormality. Electronically Signed   By: Odessa Fleming M.D.   On: 01/21/2024 08:52   DG Chest Port 1 View Result Date: 01/20/2024 CLINICAL DATA:  Follow-up pneumothorax EXAM: PORTABLE CHEST 1 VIEW COMPARISON:  01/20/2024, CT 01/20/2024, chest x-ray 03/19/2024 FINDINGS: Left-sided central venous port tip at the cavoatrial junction. No significant change in small left apical pneumothorax, demonstrating less than 1 cm pleural-parenchymal separation. Similar moderate size right pneumothorax without shift. Pulmonary nodules and masses corresponding to history of malignancy and metastatic disease. Stable cardiac size. IMPRESSION: 1. No significant change in small left apical pneumothorax and moderate size right pneumothorax. 2. Pulmonary nodules and masses corresponding to history of malignancy and metastatic disease. Electronically Signed   By: Jasmine Pang M.D.   On: 01/20/2024 21:27   CT CHEST WO CONTRAST Result Date: 01/20/2024 CLINICAL DATA:  Non-small cell lung cancer, bilateral pneumothoraces. * Tracking Code: BO * EXAM: CT CHEST WITHOUT CONTRAST TECHNIQUE: Multidetector CT imaging of the chest was performed following the standard protocol without IV contrast. RADIATION DOSE REDUCTION: This exam was performed according to the departmental  dose-optimization program which includes automated exposure control, adjustment of the mA and/or kV according to patient size and/or use of iterative reconstruction technique. COMPARISON:  Neck CT 01/20/2024 and CTA chest 12/26/2023 FINDINGS: Cardiovascular: Left Port-A-Cath tip: Cavoatrial junction. Coronary, aortic arch, and branch vessel atherosclerotic vascular disease. Mediastinum/Nodes: Aside from the right hilar mass there is little in the way of thoracic adenopathy. Lungs/Pleura: 30% right pneumothorax. Some of the right upper lobe may be tethered to the pleura by a peripheral 3.6 cm mass on image 28 series 4. Small amount of complex pleural fluid making this a hydropneumothorax. 10% left pneumothorax noted likewise with some dependent pleural density potentially from trace pleural fluid and with some loculated pneumothorax posteriorly along the posterior costophrenic angle. Scattered nodules and masses in both lungs. Dominant right hilar mass about 4.3 cm in diameter. Upper Abdomen: Abdominal aortic atherosclerosis. Indistinct masses in the left hepatic lobe. Pathologic peripancreatic node 1.5 cm in short axis, image 50 series 2. Musculoskeletal: Mild thoracic kyphosis. IMPRESSION: 1. 30% right hydropneumothorax. Some of the right upper lobe may be tethered to the pleura by a peripheral 3.6 cm mass. 2. 10% left pneumothorax with some dependent pleural density potentially from trace pleural fluid and with some loculated pneumothorax posteriorly along the posterior costophrenic angle. 3. Scattered nodules and masses in both lungs, compatible with metastatic disease. Dominant right hilar mass about 4.3 cm in diameter. 4. Indistinct masses in the left hepatic lobe, compatible with metastatic disease. Stable pathologic peripancreatic adenopathy. 5. Coronary, aortic arch, and branch vessel atherosclerotic vascular disease. Aortic Atherosclerosis (ICD10-I70.0). 6. Mild thoracic kyphosis. Electronically Signed    By: Gaylyn Rong M.D.   On: 01/20/2024 16:54   DG Chest Port 1 View Result Date: 01/20/2024 CLINICAL DATA:  Bilateral pneumothoraces seen on a neck CT with contrast earlier today. Non-small-cell lung cancer. EXAM: PORTABLE CHEST 1 VIEW COMPARISON:  Neck CT with contrast obtained earlier today. Portable chest dated 12/26/2023. FINDINGS:  Normal sized heart. Approximately 30% right apical, lateral and basilar pneumothorax. This is partially loculated. Less than 5% left apical pneumothorax. Multiple bilateral lung masses and right hilar mass with progression since 12/26/2023. Right upper lobe atelectasis/postobstructive changes unchanged from the neck CT earlier today. Mild right basilar linear atelectasis. No mediastinal shift. Minimal bilateral glenohumeral degenerative changes. Right upper lobe zone subsegmental atelectasis, postobstructive changes and mild right basilar linear atelectasis. IMPRESSION: 1. Approximately 30% right pneumothorax, partially loculated. 2. Less than 5% left apical pneumothorax. 3. Multiple bilateral lung masses and right hilar mass with progression since 12/26/2023. 4. Right upper lobe zone subsegmental atelectasis/postobstructive changes and mild right basilar linear atelectasis. Electronically Signed   By: Beckie Salts M.D.   On: 01/20/2024 15:18   CT Soft Tissue Neck W Contrast Addendum Date: 01/20/2024 ADDENDUM REPORT: 01/20/2024 14:41 ADDENDUM: These results were called by telephone at the time of interpretation on 01/20/2024 at 2:21 pm to provider Jonny Ruiz, PA, who verbally acknowledged these results. Electronically Signed   By: Emily Filbert M.D.   On: 01/20/2024 14:41   Result Date: 01/20/2024 CLINICAL DATA:  Burning sensation in back of throat, spit out blood this morning. Sore throat for a week. EXAM: CT NECK WITH CONTRAST TECHNIQUE: Multidetector CT imaging of the neck was performed using the standard protocol following the bolus administration of intravenous contrast.  RADIATION DOSE REDUCTION: This exam was performed according to the departmental dose-optimization program which includes automated exposure control, adjustment of the mA and/or kV according to patient size and/or use of iterative reconstruction technique. CONTRAST:  OMNIPAQUE IOHEXOL 300 MG/ML  SOLN COMPARISON:  CTA head and neck 09/23/2023. FINDINGS: Suprahyoid neck: Interval increase in size of heterogeneous mass involving the region of the left palatine tonsil measuring 2.6 x 2.6 x 3.2 cm, previously measuring 1.6 x 2.1 x 2.3 cm. The mass demonstrates prominent nodular areas of enhancement slightly increased since the prior study. The mass extends along the left lateral aspect of the oropharynx and partially involves the left posterior aspect of the soft palate. Mass also extends inferiorly to involve/abut the left base of tongue. There is resulting mild narrowing and rightward displacement of the or pharyngeal airway which is increased from prior. The nasopharynx is symmetric. The floor of mouth is unremarkable. Normal appearance of the epiglottis. Retropharynx is unremarkable. Infrahyoid neck: No mass or swelling. Aryepiglottic folds and pyriform sinuses are symmetric. Vocal folds are symmetric. Salivary glands: The parotid and submandibular glands are symmetric. No inflammation, mass, or calcification. Lymph nodes: Interval development of an enlarged heterogeneous left level 2A cervical node measuring 2.0 x 1.3 x 2.4 cm. The no demonstrates nodular areas of enhancement and additional areas of central hypoattenuation which could reflect necrosis similar in appearance to the left tonsillar mass. No enlarged right cervical lymph nodes. Thyroid: 1.6 cm hypoattenuating nodule along the lateral aspect of the right thyroid lobe. Vascular: Moderate atherosclerosis of the thoracic aorta. Additional mild atherosclerosis at the right carotid bifurcation. Limited intracranial: Atherosclerosis of the intracranial  ICAs. Visualized intracranial structures otherwise unremarkable. Visualized orbits: Orbits are symmetric. Mastoids and visualized paranasal sinuses: Visualized paranasal sinuses are clear. Mastoid air cells are clear. Skeleton: No acute or aggressive lesion. Upper chest: Redemonstrated dominant mass in the right upper lobe involving the pleura which is overall similar to 12/26/2023. Similar appearance of right hilar mass. Additional scattered masses in the visualized lungs are similar to prior. There is interval development of pneumothorax bilaterally more pronounced on the right. Left chest wall  Port-A-Cath partially visualized. IMPRESSION: Interval increase in size of heterogeneous mass involving the left palatine tonsil now measuring up to 3.2 cm, previously 2.3 cm. Mass abuts and possibly involves the soft palate and left aspect of the base of tongue. Mild narrowing and rightward deviation of the oropharyngeal airway is increased from prior. Interval development of enlarged enhancing left level 2A cervical node with similar characteristics to the left tonsillar mass. Similar appearance of bilateral pulmonary masses and right hilar mass compared to CT on 12/26/2023. Interval development of bilateral pneumothoraces, right greater than left. 1.6 cm right thyroid nodule. Recommend correlation with nonemergent thyroid ultrasound. Electronically Signed: By: Emily Filbert M.D. On: 01/20/2024 14:05   ECHOCARDIOGRAM LIMITED Result Date: 01/09/2024    ECHOCARDIOGRAM LIMITED REPORT   Patient Name:   Rebecca Ochoa Date of Exam: 01/09/2024 Medical Rec #:  [id number]    Height:       65.0 in Accession #:    [id number]   Weight:       129.0 lb Date of Birth:  12/14/1960     BSA:          1.642 m Patient Age:    63 years     BP:           103/71 mmHg Patient Gender: F            HR:           115 bpm. Exam Location:  Outpatient Procedure: Limited Echo and Strain Analysis (Both Spectral and Color Flow            Doppler were  utilized during procedure). Indications:    I42.80 Non-ischemic cardiomyopathy, Chemo Evaluation  History:        Patient has prior history of Echocardiogram examinations, most                 recent 12/29/2023. Risk Factors:Hypertension and Dyslipidemia.  Sonographer:    Irving Burton Senior RDCS Referring Phys: [id number] ANGELA NICOLE DUKE  Sonographer Comments: Technically difficult study, patient sitting upright due to significant dyspnea IMPRESSIONS  1. Limited Study.  2. The left ventricle demonstrates global hypokinesis with regional wall motion wall motion involving inferoseptal, inferior, and anteroseptal wall (mostly base to mid segments). . Left ventricular ejection fraction, by estimation, is 35 to 40%. The left ventricle has moderately decreased function.  3. Right ventricular systolic function grossly normal. The right ventricular size is grossly normal.  4. No obvious findings to suggest tamponade physiology as IVC is not dialated and can be compressed. Accurary is limited due to no M-modes across the right atrium and right ventricle, no repiratory variation data across the MV and TV. If clinically suspicion is high consider repeat limited echo in 24-48hrs (clinical correlation is required). A small pericardial effusion is present. The pericardial effusion is anterior to the right ventricle and surrounding the apex. The pericardial effusion/space appears to contain mixed echogenic material (clinical correlation is required) see image 4.  5. The mitral valve is grossly normal.  6. The aortic valve is grossly normal. Aortic valve sclerosis is present.  7. The inferior vena cava is normal in size with greater than 50% respiratory variability, suggesting right atrial pressure of 3 mmHg. FINDINGS  Left Ventricle: The left ventricle demonstrates global hypokinesis with regional wall motion wall motion involving inferoseptal, inferior, and anteroseptal wall (mostly base to mid segments). Left ventricular ejection  fraction, by estimation, is 35 to 40%. The left ventricle has moderately decreased function. 3D ejection  fraction reviewed and evaluated as part of the interpretation. Alternate measurement of EF is felt to be most reflective of LV function. There is no left ventricular hypertrophy. Right Ventricle: The right ventricular size is grossly normal. Right ventricular systolic function grossly normal. Pericardium: No obvious findings to suggest tamponade physiology as IVC is not dialated and can be compressed. Accurary is limited due to no M-modes across the right atrium and right ventricle, no repiratory variation data across the MV and TV. If clinically suspicion is high consider repeat limited echo in 24-48hrs (clinical correlation is required). A small pericardial effusion is present. The pericardial effusion is anterior to the right ventricle and surrounding the apex. The pericardial effusion/space appears to contain mixed echogenic material (clinical correlation is required) see image 4.  Mitral Valve: The mitral valve is grossly normal. Mild mitral annular calcification. Aortic Valve: The aortic valve is grossly normal. Aortic valve sclerosis is present. Venous: The inferior vena cava is normal in size with greater than 50% respiratory variability, suggesting right atrial pressure of 3 mmHg. LEFT VENTRICLE PLAX 2D LVIDd:         3.50 cm LVIDs:         2.90 cm     2D Longitudinal Strain LV PW:         1.00 cm     2D Strain GLS (A4C):   -11.9 % LV IVS:        0.80 cm     2D Strain GLS (A3C):   -12.2 %                            2D Strain GLS (A2C):   -7.8 %                            2D Strain GLS Avg:     -10.6 % LV Volumes (MOD) LV vol d, MOD A2C: 59.2 ml LV vol d, MOD A4C: 70.6 ml LV vol s, MOD A2C: 37.2 ml LV vol s, MOD A4C: 46.8 ml LV SV MOD A2C:     22.0 ml LV SV MOD A4C:     70.6 ml LV SV MOD BP:      21.1 ml Sunit Tolia Electronically signed by Tessa Lerner Signature Date/Time: 01/09/2024/1:33:47 PM    Final     ECHOCARDIOGRAM LIMITED Result Date: 12/29/2023    ECHOCARDIOGRAM LIMITED REPORT   Patient Name:   Rebecca Ochoa Date of Exam: 12/29/2023 Medical Rec #:  [id number]    Height:       65.0 in Accession #:    [id number]   Weight:       129.0 lb Date of Birth:  05-15-1961     BSA:          1.642 m Patient Age:    63 years     BP:           127/94 mmHg Patient Gender: F            HR:           124 bpm. Exam Location:  Inpatient Procedure: Limited Echo, Cardiac Doppler, Color Doppler and Intracardiac            Opacification Agent (Both Spectral and Color Flow Doppler were            utilized during procedure). REPORT CONTAINS CRITICAL RESULT Indications:    R00.0 Tachycardia  History:        Patient has prior history of Echocardiogram examinations, most                 recent 09/05/2023. Abnormal ECG, Arrythmias:Tachycardia,                 Signs/Symptoms:Dyspnea and Shortness of Breath; Risk                 Factors:Current Smoker and Dyslipidemia. Pulmonary embolus. Lung                 cancer. Chemo. ICH.  Sonographer:    Sheralyn Boatman RDCS Referring Phys: [id number] DANIEL P GOODRICH  Sonographer Comments: Technically difficult study due to poor echo windows. Port in parasternal. IMPRESSIONS  1. Left ventricular ejection fraction, by estimation, is 30 to 35%. The left ventricle has severely decreased function. The left ventricle demonstrates regional wall motion abnormalities with akinesis of the basal to mid anteroseptal, inferoseptal, and inferior walls. Akinesis of the basal anterior wall. The function of apical segments is relatively well-preserved. This picture of wall motion abnormalities would be unusual with coronary disease and could be consistent with a "reverse Takotsubo" stress cardiomyopathy.  2. Right ventricular systolic function is normal. The right ventricular size is normal.  3. The inferior vena cava is normal in size with greater than 50% respiratory variability, suggesting right atrial pressure of 3 mmHg.   4. Limited echo. FINDINGS  Left Ventricle: Left ventricular ejection fraction, by estimation, is 30 to 35%. The left ventricle has moderately decreased function. The left ventricle demonstrates regional wall motion abnormalities. Definity contrast agent was given IV to delineate the left ventricular endocardial borders. The left ventricular internal cavity size was normal in size. There is no left ventricular hypertrophy. Right Ventricle: The right ventricular size is normal. No increase in right ventricular wall thickness. Right ventricular systolic function is normal. Pericardium: There is no evidence of pericardial effusion. Venous: The inferior vena cava is normal in size with greater than 50% respiratory variability, suggesting right atrial pressure of 3 mmHg. Additional Comments: Spectral Doppler performed. Color Doppler performed.  LEFT VENTRICLE PLAX 2D LVIDd:         4.60 cm LVIDs:         4.00 cm LV PW:         1.00 cm LV IVS:        1.10 cm  LV Volumes (MOD) LV vol d, MOD A2C: 70.8 ml LV vol d, MOD A4C: 107.0 ml LV vol s, MOD A2C: 61.4 ml LV vol s, MOD A4C: 62.4 ml LV SV MOD A2C:     9.4 ml LV SV MOD A4C:     107.0 ml LV SV MOD BP:      35.4 ml IVC IVC diam: 1.30 cm Dalton McleanMD Electronically signed by [name] Signature Date/Time: 12/29/2023/3:26:14 PM    Final    CT ABDOMEN PELVIS WO CONTRAST Result Date: 12/26/2023 CLINICAL DATA:  History of non-small cell lung cancer, abdominal pain, anemia EXAM: CT ABDOMEN AND PELVIS WITHOUT CONTRAST TECHNIQUE: Multidetector CT imaging of the abdomen and pelvis was performed following the standard protocol without IV contrast. RADIATION DOSE REDUCTION: This exam was performed according to the departmental dose-optimization program which includes automated exposure control, adjustment of the mA and/or kV according to patient size and/or use of iterative reconstruction technique. COMPARISON:  11/09/2023, 12/26/2023 FINDINGS: Lower chest: Multiple pulmonary  nodules and right hilar mass are seen consistent with known metastatic  lung cancer. Please see CT chest performed earlier today. Hepatobiliary: Numerous hypodense masses are seen throughout the liver, demonstrating decreased size and central necrosis since prior study. Index lesion in the left lobe liver measures 6.0 x 3.5 cm reference image 10/2, previously 8.3 x 4.5 cm. No evidence of cholelithiasis or cholecystitis. No evidence of biliary duct dilation. Pancreas: Unremarkable. No pancreatic ductal dilatation or surrounding inflammatory changes. Spleen: Normal in size without focal abnormality. The hypodense lesions seen on prior study, and visualized on earlier CT chest performed today, are not well visualized on this exam likely due to phase of contrast enhancement. Adrenals/Urinary Tract: Numerous indeterminate bilateral renal hypodensities are again noted. Index lesion in the interpolar right kidney reference image 17/2 measures 1.7 cm, previously measuring 1.8 cm. No hydronephrosis or nephrolithiasis. The adrenals are unremarkable. Excreted contrast within the urinary bladder with no filling defect identified. Stomach/Bowel: No bowel obstruction or ileus. Normal appendix right lower quadrant. Diverticulosis of the sigmoid colon without evidence of acute diverticulitis. Vascular/Lymphatic: Stable aortic atherosclerosis. There is persistent adenopathy at the porta hepatis, with decreased areas of necrosis and cavitation since prior study. Index lymph node on image 16/2 measures 2.4 cm in short axis, unchanged by my measurements since prior. No new adenopathy identified. Reproductive: Uterus and bilateral adnexa are unremarkable. Other: No free fluid or free intraperitoneal gas. No abdominal wall hernia. Musculoskeletal: No acute or destructive bony abnormalities. Reconstructed images demonstrate no additional findings. IMPRESSION: 1. Right hilar mass and numerous bilateral pulmonary nodules consistent with  known history of metastatic lung cancer. Please see chest CT performed earlier today. 2. Numerous hypodense masses within the liver, demonstrating decrease in size and resolution of central necrosis/cavitation on prior study. 3. Continued portacaval adenopathy, without significant change in size of lymph nodes. The central cavitation/necrosis has resolved. 4. Numerous bilateral renal hypodensities, too small to characterize, but grossly stable since prior exam. Metastatic disease remains the diagnosis of exclusion. 5. The splenic hypodensity seen on previous study and visualized on earlier chest CT today are not well visualized on this exam, likely due to delayed imaging relative to contrast administration performed earlier for chest CT. 6. Sigmoid diverticulosis without diverticulitis. 7. Aortic Atherosclerosis (ICD10-I70.0). Electronically Signed   By: Sharlet Salina M.D.   On: 12/26/2023 18:40   CT Angio Chest PE W and/or Wo Contrast Result Date: 12/26/2023 CLINICAL DATA:  Concern for pulmonary embolism. History of non-small cell lung cancer. EXAM: CT ANGIOGRAPHY CHEST WITH CONTRAST TECHNIQUE: Multidetector CT imaging of the chest was performed using the standard protocol during bolus administration of intravenous contrast. Multiplanar CT image reconstructions and MIPs were obtained to evaluate the vascular anatomy. RADIATION DOSE REDUCTION: This exam was performed according to the departmental dose-optimization program which includes automated exposure control, adjustment of the mA and/or kV according to patient size and/or use of iterative reconstruction technique. CONTRAST:  75mL OMNIPAQUE IOHEXOL 350 MG/ML SOLN COMPARISON:  CT dated 11/09/2023. FINDINGS: Cardiovascular: There is no cardiomegaly or pericardial effusion. Mild atherosclerotic calcification of the thoracic aorta. No aneurysmal dilatation or dissection. The origins of the great vessels of the aortic arch appear patent. Left-sided Port-A-Cath  with tip at the cavoatrial junction. No pulmonary artery embolus identified. Mediastinum/Nodes: Right hilar mass encasing the right pulmonary artery and right upper and middle lobe bronchi relatively similar to prior CT. The esophagus is grossly unremarkable. No mediastinal fluid collection. Lungs/Pleura: Multiple bilateral pulmonary nodules similar or slightly decreased since the prior CT consistent with metastatic disease. No consolidative changes. Trace  right pleural effusion. No pneumothorax. The central airways are patent. Upper Abdomen: Retroperitoneal adenopathy and left liver masses. Musculoskeletal: Osteopenia with degenerative changes. No acute osseous pathology. Review of the MIP images confirms the above findings. IMPRESSION: 1. No CT evidence of pulmonary artery embolus. 2. Right hilar mass and pulmonary metastasis similar or slightly decreased in size since the prior CT. 3. Retroperitoneal adenopathy and left liver masses. 4.  Aortic Atherosclerosis (ICD10-I70.0). Electronically Signed   By: Elgie Collard M.D.   On: 12/26/2023 14:18   DG Chest 1 View Result Date: 12/26/2023 CLINICAL DATA:  Chest pain, cough, and weakness. EXAM: CHEST  1 VIEW COMPARISON:  11/09/2023. FINDINGS: The heart size and mediastinal contours are within normal limits. There is atherosclerotic calcification of the aorta scattered opacities with right upper lobe and right hilar mass are noted in the lungs bilaterally, compatible with known pulmonary nodules and masses. No effusion or pneumothorax is seen. A left chest port is stable in position. No acute osseous abnormality. IMPRESSION: Scattered opacities in the lungs bilaterally with right upper lobe and right hilar masslike consolidation, increased from the prior exam. Electronically Signed   By: Thornell Sartorius M.D.   On: 12/26/2023 10:46    Labs:  CBC: Recent Labs    01/11/24 1010 01/19/24 1420 01/20/24 1110 01/21/24 0245  WBC 10.1 10.6* 8.7 8.4  HGB 10.0*  9.2* 8.0* 7.0*  HCT 31.4* 28.5* 26.0* 21.6*  PLT 385 373 283 252    COAGS: Recent Labs    09/19/23 0340 10/17/23 1944 10/18/23 0345 12/27/23 0658 01/20/24 1740 01/21/24 0245  INR 2.1*  --   --  1.7* 2.0* 1.8*  APTT 40* <22* 156* 36 47*  --     BMP: Recent Labs    01/11/24 1010 01/19/24 1420 01/20/24 1110 01/21/24 0245  NA 137 136 135 135  K 4.1 3.4* 3.2* 3.0*  CL 103 98 103 102  CO2 29 27 22 23   GLUCOSE 107* 96 90 93  BUN 11 14 13 11   CALCIUM 8.9 9.6 8.6* 8.6*  CREATININE 0.86 0.79 0.76 0.85  GFRNONAA >60 >60 >60 >60    LIVER FUNCTION TESTS: Recent Labs    12/26/23 0755 12/26/23 0936 01/11/24 1010 01/19/24 1420  BILITOT 0.9 1.2 0.7 1.1  AST 22 25 44* 60*  ALT 18 21 20 28   ALKPHOS 108 103 144* 133*  PROT 6.9 6.6 7.4 7.7  ALBUMIN 3.6 3.0* 3.1* 3.2*    TUMOR MARKERS: No results for input(s): "AFPTM", "CEA", "CA199", "CHROMGRNA" in the last 8760 hours.  Assessment and Plan:  Hemorrhagic left tonsillar mass: Mercedees Convery is a 63 y.o. 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 Fatima Sanger.  -Plan to hold on embolization as patient has not bled since last night -Will continue to monitor INR for now with a goal of 1.5 -Keep NPO until INR goal achieved -Will plan for repeat INR and CBC in the morning -Should the mass start to re-bleed, NIR is prepared to intervene with angiography and possible embolization  Thank you for allowing our service to participate in Rebecca Ochoa 's care.   The Risks and benefits of embolization were discussed with the patient including, but not limited to bleeding, infection, vascular injury, post operative pain, or contrast induced renal failure.  This procedure involves the use of  X-rays and because of the nature of the planned procedure, it is  possible that we will have prolonged use of X-ray fluoroscopy.  Potential radiation risks to you include (but are not limited to) the following: - A slightly elevated risk for cancer several years later in life. This risk is typically less than 0.5% percent. This risk is low in comparison to the normal incidence of human cancer, which is 33% for women and 50% for men according to the [employer] Society. - Radiation induced injury can include skin redness, resembling a rash, tissue breakdown / ulcers and hair loss (which can be temporary or permanent).   The likelihood of either of these occurring depends on the difficulty of the procedure and whether you are sensitive to radiation due to previous procedures, disease, or genetic conditions.   IF your procedure requires a prolonged use of radiation, you will be notified and given written instructions for further action.  It is your responsibility to monitor the irradiated area for the 2 weeks following the procedure and to notify your physician if you are concerned that you have suffered a radiation induced injury.    All of the patient's questions were answered, patient is agreeable to proceed. Consent signed and in chart.   Electronically Signed: Jama Flavors, PA-C   01/21/2024, 9:57 AM     I spent a total of 40 Minutes  in face to face in clinical consultation, greater than 50% of which was counseling/coordinating care for possible embolization of left tonsillar mass.  (A copy of this note was sent to the referring provider and the time of visit.)

## 2024-01-21 NOTE — Progress Notes (Signed)
 NAME:  Rebecca Ochoa, MRN:  [id number], DOB:  1960/11/26, LOS: 1 ADMISSION DATE:  01/20/2024, CONSULTATION DATE:  12/31/23 REFERRING MD:  Dr Shawna Orleans BELFI, CHIEF COMPLAINT:  ORal BLeeding from tonsillar mass *. Also R > L Loculated Ptx. Known lung cancer  PCP Patient, No Pcp Per Onc:L Mohamed Primary Pulm:  [place]  History of Present Illness:   63 y.o. 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  Her most recent cancer center/oncology visit was yesterday January 19, 2024 for management of multiple chemotherapy side effects.   She presented to the [facility] emergency room today with throat bleeding reported as signiicant - half coffee cup in morning. And int he ER when she drank water coughed blood again. .  ED workup notable for large left tonsillar tumor and necrotic lymph nodes on CT neck.  In addition patient found to have bilateral pneumothoraces R > L with R 30% but loculated. No distress. And Pulse 93% RA at rest - Pneumothorax is new compared to CT a month earlier with oncology  Noted to have high tumor burden that is progressing  PE Nov 2024 on eliquis Targisso stopped due to long QTc   Past Medical History:    has a past medical history of Anemia, Asthma, mild intermittent, well-controlled, GERD (gastroesophageal reflux disease), migraines, Hyperlipidemia, Hypertension, and Vitamin D deficiency.   reports that she has quit smoking. Her smoking use included e-cigarettes. She has never used smokeless tobacco.  Past Surgical History:  Procedure Laterality Date   BREAST SURGERY     reduction   BRONCHIAL BIOPSY  08/08/2023   Procedure: BRONCHIAL BIOPSIES;  Surgeon: Leslye Peer, MD;  Location: [facility] ENDOSCOPY;  Service: Pulmonary;;   BRONCHIAL BRUSHINGS  08/08/2023   Procedure: BRONCHIAL  BRUSHINGS;  Surgeon: Leslye Peer, MD;  Location: [facility] ENDOSCOPY;  Service: Pulmonary;;   BRONCHIAL NEEDLE ASPIRATION BIOPSY  08/08/2023   Procedure: BRONCHIAL NEEDLE ASPIRATION BIOPSIES;  Surgeon: Leslye Peer, MD;  Location: [facility] ENDOSCOPY;  Service: Pulmonary;;   ENDOBRONCHIAL ULTRASOUND Bilateral 08/08/2023   Procedure: ENDOBRONCHIAL ULTRASOUND;  Surgeon: Leslye Peer, MD;  Location: [facility] ENDOSCOPY;  Service: Pulmonary;  Laterality: Bilateral;   HEMOSTASIS CONTROL  08/08/2023   Procedure: HEMOSTASIS CONTROL;  Surgeon: Leslye Peer, MD;  Location: [facility] ENDOSCOPY;  Service: Pulmonary;;   IR IMAGING GUIDED PORT INSERTION  09/01/2023   REDUCTION MAMMAPLASTY     TUBAL LIGATION       Current Facility-Administered Medications:    Chlorhexidine Gluconate Cloth 2 % PADS 6 each, 6 each, Topical, Daily, Kalman Shan, MD, 6 each at 01/20/24 2036   dextrose 5 % in lactated ringers infusion, , Intravenous, Continuous, Kalman Shan, MD, Last Rate: 40 mL/hr at 01/21/24 0600, Infusion Verify at 01/21/24 0600   docusate sodium (COLACE) capsule 100 mg, 100 mg, Oral, BID PRN, Kalman Shan, MD   LORazepam (ATIVAN) injection 1 mg, 1 mg, Intravenous, Q4H PRN, Paliwal, Aditya, MD, 1 mg at 01/21/24 0240   Oral care mouth rinse, 15 mL, Mouth Rinse, PRN, Kalman Shan, MD   pantoprazole (PROTONIX) injection 40 mg, 40 mg, Intravenous, Q24H, Ramaswamy, Murali, MD   polyethylene glycol (MIRALAX / GLYCOLAX) packet 17 g, 17 g, Oral, Daily PRN, Marchelle Gearing, Murali, MD   potassium chloride 10 mEq in 50 mL *CENTRAL LINE* IVPB, 10 mEq, Intravenous, Q1 Hr x  6, Paliwal, Aditya, MD, Last Rate: 50 mL/hr at 01/21/24 0803, 10 mEq at 01/21/24 0803   sodium chloride flush (NS) 0.9 % injection 10-40 mL, 10-40 mL, Intracatheter, PRN, Kalman Shan, MD   tranexamic acid (CYKLOKAPRON) 1000 MG/10ML nebulizer solution 500 mg, 500 mg, Nebulization, Q8H, Ramaswamy, Murali, MD, 500 mg at 01/20/24  2331  Facility-Administered Medications Ordered in Other Encounters:    0.9 %  sodium chloride infusion (Manually program via Guardrails IV Fluids), 250 mL, Intravenous, Continuous, Heilingoetter, Cassandra L, PA-C, Stopped at 12/13/23 1658   0.9 %  sodium chloride infusion, , Intravenous, Continuous, Si Gaul, MD, Stopped at 12/13/23 1526  Significant Hospital Events:  3/22 -admitted to [facility] long for bleeding from tonsillar mass.  Transferred to [facility] in anticipation of embolization.   Subjective  No further bleeding since arrival.  Only coughed up an old clot.  She does not feel that her dyspnea has worsened substantially of late.  Objective   Blood pressure 104/63, pulse (!) 114, temperature 98.7 F (37.1 C), temperature source Oral, resp. rate (!) 28, height 5\' 5"  (1.651 m), weight 57.2 kg, SpO2 90%.        Intake/Output Summary (Last 24 hours) at 01/21/2024 0919 Last data filed at 01/21/2024 0600 Gross per 24 hour  Intake 639.84 ml  Output 2 ml  Net 637.84 ml   Filed Weights   01/20/24 0959  Weight: 57.2 kg    Examination: General: Frail and cachectic woman sitting in bed in no distress. HENT: No facial asymmetry.  Able to talk without dysarthria. Lungs: Chest clear bilaterally with equal air entry.  Trachea is midline. Cardiovascular: Normal heart sounds Abdomen: Soft Extremities: Intact Neuro: AxXOx 3. Speech normal  Ancillary tests personally reviewed  CT shows a moderate-sized right sided pneumothorax.  Assessment & Plan:   Bilateral pneumothoraces due to increasing tumor burden right greater than left. Bleeding tonsillar mass, likely exacerbated by Eliquis use for pulmonary embolism. Stage IVb non-small cell lung cancer, poorly differentiated squamous cell Currently receiving palliative chemotherapy with Tarceva and carboplatin/Alimta Cancer related cachexia  Plan:  -Bleeding appears to have stopped spontaneously.  Neuro IR will reassess,  but likely will not intervene given no active bleeding. -If so, can defer evacuating pneumothorax given that patient will not be requiring positive pressure ventilation and is minimally symptomatic. -If there is to be no intervention, can progressively ambulate and start on soft diet.   Best practice (daily eval):  Diet: NPO )I - start IVF Pain/Anxiety/Delirium protocol (if indicated): x VAP protocol (if indicated): x DVT prophylaxis: SCD GI prophylaxis: Not applicable Glucose control: na Mobility: bed rest Code Status: full code Family Communication: Patient updated at bedside.  Lynnell Catalan, MD [facility] ICU Physician [facility]  Pager: [phone] Or Epic Secure Chat After hours: [phone].  01/21/2024, 9:28 AM

## 2024-01-21 NOTE — Progress Notes (Signed)
 [facility] ADULT ICU REPLACEMENT PROTOCOL   The patient does apply for the [facility] Adult ICU Electrolyte Replacment Protocol based on the criteria listed below:   1.Exclusion criteria: TCTS, ECMO, Dialysis, and Myasthenia Gravis patients 2. Is GFR >/= 30 ml/min? Yes.    Patient's GFR today is > 60 3. Is SCr </= 2? Yes.   Patient's SCr is 0.85 mg/dL 4. Did SCr increase >/= 0.5 in 24 hours? No. 5.Pt's weight >40kg  Yes.   6. Abnormal electrolyte(s): K+ 3.0  7. Electrolytes replaced per protocol 8.  Call MD STAT for K+ </= 2.5, Phos </= 1, or Mag </= 1 Physician:  Dr Rhodia Albright, Jettie Booze 01/21/2024 4:06 AM

## 2024-01-22 DIAGNOSIS — D490 Neoplasm of unspecified behavior of digestive system: Principal | ICD-10-CM

## 2024-01-22 DIAGNOSIS — Z515 Encounter for palliative care: Secondary | ICD-10-CM

## 2024-01-22 DIAGNOSIS — R58 Hemorrhage, not elsewhere classified: Secondary | ICD-10-CM

## 2024-01-22 DIAGNOSIS — C3491 Malignant neoplasm of unspecified part of right bronchus or lung: Secondary | ICD-10-CM | POA: Diagnosis not present

## 2024-01-22 DIAGNOSIS — Z7189 Other specified counseling: Secondary | ICD-10-CM

## 2024-01-22 LAB — PROTIME-INR
INR: 1.9 — ABNORMAL HIGH (ref 0.8–1.2)
Prothrombin Time: 21.7 s — ABNORMAL HIGH (ref 11.4–15.2)

## 2024-01-22 LAB — CBC
HCT: 21 % — ABNORMAL LOW (ref 36.0–46.0)
Hemoglobin: 6.6 g/dL — CL (ref 12.0–15.0)
MCH: 27.6 pg (ref 26.0–34.0)
MCHC: 31.4 g/dL (ref 30.0–36.0)
MCV: 87.9 fL (ref 80.0–100.0)
Platelets: 235 10*3/uL (ref 150–400)
RBC: 2.39 MIL/uL — ABNORMAL LOW (ref 3.87–5.11)
RDW: 19.6 % — ABNORMAL HIGH (ref 11.5–15.5)
WBC: 7.4 10*3/uL (ref 4.0–10.5)
nRBC: 0 % (ref 0.0–0.2)

## 2024-01-22 LAB — BASIC METABOLIC PANEL
Anion gap: 10 (ref 5–15)
BUN: 5 mg/dL — ABNORMAL LOW (ref 8–23)
CO2: 23 mmol/L (ref 22–32)
Calcium: 9 mg/dL (ref 8.9–10.3)
Chloride: 102 mmol/L (ref 98–111)
Creatinine, Ser: 0.65 mg/dL (ref 0.44–1.00)
GFR, Estimated: >60 mL/min (ref >60)
Glucose, Bld: 94 mg/dL (ref 70–99)
Potassium: 3.5 mmol/L (ref 3.5–5.1)
Sodium: 135 mmol/L (ref 135–145)

## 2024-01-22 LAB — COMPREHENSIVE METABOLIC PANEL
ALT: 19 U/L (ref 0–44)
AST: 42 U/L — ABNORMAL HIGH (ref 15–41)
Albumin: 2.1 g/dL — ABNORMAL LOW (ref 3.5–5.0)
Alkaline Phosphatase: 88 U/L (ref 38–126)
Anion gap: 9 (ref 5–15)
BUN: 6 mg/dL — ABNORMAL LOW (ref 8–23)
CO2: 24 mmol/L (ref 22–32)
Calcium: 8.8 mg/dL — ABNORMAL LOW (ref 8.9–10.3)
Chloride: 106 mmol/L (ref 98–111)
Creatinine, Ser: 0.74 mg/dL (ref 0.44–1.00)
GFR, Estimated: >60 mL/min (ref >60)
Glucose, Bld: 101 mg/dL — ABNORMAL HIGH (ref 70–99)
Potassium: 3.1 mmol/L — ABNORMAL LOW (ref 3.5–5.1)
Sodium: 139 mmol/L (ref 135–145)
Total Bilirubin: 1.1 mg/dL (ref 0.0–1.2)
Total Protein: 5.8 g/dL — ABNORMAL LOW (ref 6.5–8.1)

## 2024-01-22 LAB — GLUCOSE, CAPILLARY
Glucose-Capillary: 107 mg/dL — ABNORMAL HIGH (ref 70–99)
Glucose-Capillary: 92 mg/dL (ref 70–99)
Glucose-Capillary: 95 mg/dL (ref 70–99)
Glucose-Capillary: 101 mg/dL — ABNORMAL HIGH (ref 70–99)
Glucose-Capillary: 88 mg/dL (ref 70–99)

## 2024-01-22 LAB — PREPARE RBC (CROSSMATCH)

## 2024-01-22 LAB — HEMOGLOBIN AND HEMATOCRIT, BLOOD
HCT: 25.6 % — ABNORMAL LOW (ref 36.0–46.0)
Hemoglobin: 8.4 g/dL — ABNORMAL LOW (ref 12.0–15.0)

## 2024-01-22 MED ORDER — POTASSIUM CHLORIDE 10 MEQ/100ML IV SOLN
10.0000 meq | INTRAVENOUS | Status: AC
  Administered 2024-01-22 (×6): 10 meq via INTRAVENOUS
  Filled 2024-01-22 (×6): qty 100

## 2024-01-22 MED ORDER — SODIUM CHLORIDE 0.9% IV SOLUTION: Freq: Once | INTRAVENOUS | Status: AC

## 2024-01-22 MED ORDER — ENSURE ENLIVE PO LIQD
237.0000 mL | Freq: Two times a day (BID) | ORAL | Status: DC
  Administered 2024-01-24 – 2024-01-25 (×3): 237 mL via ORAL
  Filled 2024-01-22 (×2): qty 237

## 2024-01-22 MED ORDER — ENOXAPARIN SODIUM 40 MG/0.4ML IJ SOSY
40.0000 mg | PREFILLED_SYRINGE | INTRAMUSCULAR | Status: DC
  Administered 2024-01-22 – 2024-01-25 (×4): 40 mg via SUBCUTANEOUS
  Filled 2024-01-22 (×4): qty 0.4

## 2024-01-22 MED ORDER — HEPARIN SODIUM (PORCINE) 5000 UNIT/ML IJ SOLN: 5000.0000 [IU] | Freq: Three times a day (TID) | INTRAMUSCULAR | Status: DC

## 2024-01-22 NOTE — Plan of Care (Signed)

## 2024-01-22 NOTE — Progress Notes (Addendum)
 NAME:  Rebecca Ochoa, MRN:  [id number], DOB:  [birth year], LOS: 2 ADMISSION DATE:  01/20/2024, CONSULTATION DATE:  12/31/23 REFERRING MD:  Dr Shawna Orleans BELFI, CHIEF COMPLAINT:  ORal BLeeding from tonsillar mass *. Also R > L Loculated Ptx. Known lung cancer  PCP Patient, No Pcp Per Onc:L Mohamed Primary Pulm:  [place]rum  History of Present Illness:   63 y.o. 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  Her most recent cancer center/oncology visit was yesterday January 19, 2024 for management of multiple chemotherapy side effects.   She presented to the [facility] emergency room today with throat bleeding reported as signiicant - half coffee cup in morning. And int he ER when she drank water coughed blood again. .  ED workup notable for large left tonsillar tumor and necrotic lymph nodes on CT neck.  In addition patient found to have bilateral pneumothoraces R > L with R 30% but loculated. No distress. And Pulse 93% RA at rest - Pneumothorax is new compared to CT a month earlier with oncology Noted to have high tumor burden that is progressing. PE Nov 2024 on eliquis Targisso stopped due to long QTc   Past Medical History:  Stage IVb non-small cell lung cancer, oropharynx tonsillar cancer, asthma, hyperlipidemia, vitamin D deficiency  Past Surgical History:  Procedure Laterality Date   BREAST SURGERY     reduction   BRONCHIAL BIOPSY  08/08/2023   Procedure: BRONCHIAL BIOPSIES;  Surgeon: Leslye Peer, MD;  Location: [facility] ENDOSCOPY;  Service: Pulmonary;;   BRONCHIAL BRUSHINGS  08/08/2023   Procedure: BRONCHIAL BRUSHINGS;  Surgeon: Leslye Peer, MD;  Location: [facility] ENDOSCOPY;  Service: Pulmonary;;   BRONCHIAL NEEDLE ASPIRATION BIOPSY  08/08/2023   Procedure: BRONCHIAL NEEDLE ASPIRATION BIOPSIES;  Surgeon:  Leslye Peer, MD;  Location: [facility] ENDOSCOPY;  Service: Pulmonary;;   ENDOBRONCHIAL ULTRASOUND Bilateral 08/08/2023   Procedure: ENDOBRONCHIAL ULTRASOUND;  Surgeon: Leslye Peer, MD;  Location: [facility] ENDOSCOPY;  Service: Pulmonary;  Laterality: Bilateral;   HEMOSTASIS CONTROL  08/08/2023   Procedure: HEMOSTASIS CONTROL;  Surgeon: Leslye Peer, MD;  Location: [facility] ENDOSCOPY;  Service: Pulmonary;;   IR IMAGING GUIDED PORT INSERTION  09/01/2023   REDUCTION MAMMAPLASTY     TUBAL LIGATION       Current Facility-Administered Medications:    Chlorhexidine Gluconate Cloth 2 % PADS 6 each, 6 each, Topical, Daily, Ramaswamy, Murali, MD, 6 each at 01/21/24 0954   dextrose 5 % in lactated ringers infusion, , Intravenous, Continuous, Paliwal, Aditya, MD, Last Rate: 40 mL/hr at 01/22/24 0600, Infusion Verify at 01/22/24 0600   docusate sodium (COLACE) capsule 100 mg, 100 mg, Oral, BID PRN, Kalman Shan, MD   LORazepam (ATIVAN) injection 1 mg, 1 mg, Intravenous, Q4H PRN, Paliwal, Aditya, MD, 1 mg at 01/22/24 0008   metoprolol tartrate (LOPRESSOR) injection 5 mg, 5 mg, Intravenous, Q6H, Agarwala, Ravi, MD, 5 mg at 01/22/24 0532   morphine (PF) 2 MG/ML injection 2 mg, 2 mg, Intravenous, Q3H PRN, Agarwala, Ravi, MD, 2 mg at 01/21/24 1208   Oral care mouth rinse, 15 mL, Mouth Rinse, PRN, Kalman Shan, MD   pantoprazole (PROTONIX) injection 40 mg, 40 mg, Intravenous, Q24H, Ramaswamy, Murali, MD, 40 mg at 01/21/24 1921   polyethylene glycol (MIRALAX / GLYCOLAX) packet 17 g, 17 g, Oral, Daily PRN, Kalman Shan, MD  potassium chloride 10 mEq in 100 mL IVPB, 10 mEq, Intravenous, Q1 Hr x 6, Paliwal, Aditya, MD, Last Rate: 100 mL/hr at 01/22/24 0608, 10 mEq at 01/22/24 4098   sodium chloride flush (NS) 0.9 % injection 10-40 mL, 10-40 mL, Intracatheter, PRN, Kalman Shan, MD  Facility-Administered Medications Ordered in Other Encounters:    0.9 %  sodium chloride infusion (Manually program via  Guardrails IV Fluids), 250 mL, Intravenous, Continuous, Heilingoetter, Cassandra L, PA-C, Stopped at 12/13/23 1658   0.9 %  sodium chloride infusion, , Intravenous, Continuous, Si Gaul, MD, Stopped at 12/13/23 1526  Significant Hospital Events:  3/22 -admitted to [facility] for bleeding from tonsillar mass.  Transferred to [facility] in anticipation of embolization.   Subjective  No recent bleeding overnight.  Family open to speaking with palliative about goals of care.  Objective   Blood pressure 124/71, pulse (!) 114, temperature 99.7 F (37.6 C), temperature source Oral, resp. rate (!) 24, height 5\' 5"  (1.651 m), weight 57.2 kg, SpO2 91%.       Intake/Output Summary (Last 24 hours) at 01/22/2024 0649 Last data filed at 01/22/2024 0600 Gross per 24 hour  Intake 1321.37 ml  Output --  Net 1321.37 ml   Labs:  Hb 6.6, s/p 1 unit of blood transfusion. Potassium 3.1 INR 1.9 Blood glucose 100-107  Filed Weights   01/20/24 0959  Weight: 57.2 kg   Examination: General: Frail and cachectic woman sitting in bed in no distress. HENT: No facial asymmetry.  Able to talk without dysarthria. Lungs: Chest clear bilaterally with equal air entry.  Trachea is midline. Cardiovascular: Normal heart sounds Abdomen: Soft Extremities: Intact Neuro: AxXOx 3. Speech normal  Ancillary tests personally reviewed  CT shows a moderate-sized right sided pneumothorax.  Assessment & Plan:   Bilateral pneumothoraces due to increasing tumor burden right greater than left. Bleeding tonsillar mass, likely exacerbated by Eliquis use for pulmonary embolism. Stage IVb non-small cell lung cancer, poorly differentiated squamous cell Currently receiving palliative chemotherapy with Tarceva and carboplatin/Alimta Cancer related cachexia  Throat bleeding has resolved. Hb 6.6 S/p 1 unit of rbc, post H&H 8.4.  She was evaluated by interventional radiology yesterday recommends holding off embolization  as bleeding has resolved. She has a hx of bilateral PE, INR mildly increased 1.9,  will hold off vitamin K, as will make pt more coagulopathic.  Will engage palliative, and start pt on soft diet.   Plan: - Soft diet - Palliative consulted, will follow up with  rec's.  - restarted lovenox for DVT. - PT treat and eval. - K 3.3, IV runs of potassium.  - CBC, BMP.  Best practice (daily eval):  Diet:  soft diet  DVT prophylaxis: Lovenox GI prophylaxis: Not applicable Glucose control: na Mobility: bed rest Code Status: full code Family Communication: Patient updated at bedside.  Laretta Bolster, M.D.  Internal Medicine Resident, PGY-1 [facility]  Pager: [phone] 6:51 AM, 01/22/2024

## 2024-01-22 NOTE — Plan of Care (Signed)

## 2024-01-22 NOTE — Consult Note (Signed)
 Consultation Note Date: 01/22/2024   Patient Name: Rebecca Ochoa  DOB: 02-Aug-1961  MRN: [id number]  Age / Sex: 63 y.o., female  PCP: Patient, No Pcp Per Referring Physician: Lynnell Catalan, MD  Reason for Consultation: Establishing goals of care and Psychosocial/spiritual support  HPI/Patient Profile: 63 y.o. 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  Her most recent cancer center/oncology visit was yesterday January 19, 2024 for management of multiple chemotherapy side effects.   She presented to the [facility] emergency room today with throat bleeding reported as signiicant - half coffee cup in morning. And int he ER when she drank water coughed blood again. .  ED workup notable for large left tonsillar tumor and necrotic lymph nodes on CT neck.  In addition patient found to have bilateral pneumothoraces R > L with R 30% but loculated. No distress. And Pulse 93% RA at rest - Pneumothorax is new compared to CT a month earlier with oncology   Noted to have high tumor burden that is progressing  Patient faces treatment option decisions, advanced directive decisions and anticipatory care needs.   Clinical Assessment and Goals of Care:  This NP Lorinda Creed reviewed medical records, received report from team, assessed the patient and then meet at the patient's bedside along with SO/ Terence Lux is not you will get the email to discuss diagnosis, prognosis, GOC, EOL wishes disposition and options.   Concept of Palliative Care was introduced as specialized medical care for people and their families living with serious illness.  If focuses on providing relief from the symptoms and stress of a  serious illness.  The goal is to improve quality of life for both the patient and the family. Values and goals of care important to patient and family were attempted to be elicited.   A  discussion was had today regarding advanced directives.  Concepts specific to code status, artifical feeding and hydration, continued IV antibiotics and rehospitalization was had.    The difference between a aggressive medical intervention path  and a palliative comfort care path for this patient at this time was had.      MOST form introduced and Hard Choices booklet left for review   Patient and her SO understand the seriousness of her situation.  It has been a long day, she is tired.  At this time she wants to rest.   Both are open to ongoing conversation with PMT as they navigate health care decisions.     Questions and concerns addressed.      PMT will continue to support holistically.       OTHER- today patient tells me she would like her SO/ Terence Lux to be her decsion maker in the evetn  she cannot make her own medical decisions   Patient faces treatment option decisions, advanced directive decisions and anticipatory care  needs.    SUMMARY OF RECOMMENDATIONS    Code Status/Advance Care Planning: Full code Educated patient/family to consider DNR/DNI status understanding evidenced based poor outcomes in similar hospitalized patient, as the cause of arrest is likely associated with advanced chronic illness rather than an easily reversible acute cardio-pulmonary event.   Symptom Management:  Education offered to patient and significant other at bedside on medications in place to treat symptoms specific to pain/dyspnea/anxiety--- Morphine and Ativan   Palliative Prophylaxis:  Aspiration, Bowel Regimen, Delirium Protocol, Frequent Pain Assessment, and Oral Care  Additional Recommendations (Limitations, Scope, Preferences): Full Scope Treatment  Psycho-social/Spiritual:  Desire for  further Chaplaincy support:no Additional Recommendations: Emotional support offered  Prognosis:  Unable to determine  Discharge Planning: To Be Determined      Primary Diagnoses: Present on Admission:  Hemorrhage   I have reviewed the medical record, interviewed the patient and family, and examined the patient. The following aspects are pertinent.  Past Medical History:  Diagnosis Date   Anemia    Asthma, mild intermittent, well-controlled    GERD (gastroesophageal reflux disease)    Hx of migraines    Hyperlipidemia    Hypertension    Vitamin D deficiency    Social History   Socioeconomic History   Marital status: Media planner    Spouse name: Not on file   Number of children: Not on file   Years of education: Not on file   Highest education level: Not on file  Occupational History   Not on file  Tobacco Use   Smoking status: Former    Types: E-cigarettes   Smokeless tobacco: Never   Tobacco comments:    Quit in October 2024.  11/09/2023 hfb  Vaping Use   Vaping status: Every Day  Substance and Sexual Activity   Alcohol use: Yes    Alcohol/week: 3.0 standard drinks of alcohol    Types: 3 Standard drinks or equivalent per week   Drug use: Never   Sexual activity: Not on file  Other Topics Concern   Not on file  Social History Narrative   Not on file   Social Drivers of Health   Financial Resource Strain: Not on file  Food Insecurity: No Food Insecurity (12/27/2023)   Hunger Vital Sign    Worried About Running Out of Food in the Last Year: Never true    Ran Out of Food in the Last Year: Never true  Transportation Needs: No Transportation Needs (12/27/2023)   PRAPARE - Administrator, Civil Service (Medical): No    Lack of Transportation (Non-Medical): No  Recent Concern: Transportation Needs - Unmet Transportation Needs (10/17/2023)   PRAPARE - Administrator, Civil Service (Medical): Yes    Lack of Transportation  (Non-Medical): No  Physical Activity: Not on file  Stress: Not on file  Social Connections: Not on file   Family History  Problem Relation Age of Onset   Heart disease Father    Hypertension Father    Diabetes Father    Kidney disease Father    Cancer Brother    Scheduled Meds:  Chlorhexidine Gluconate Cloth  6 each Topical Daily   enoxaparin (LOVENOX) injection  40 mg Subcutaneous Q24H   metoprolol tartrate  5 mg Intravenous Q6H   pantoprazole (PROTONIX) IV  40 mg Intravenous Q24H   Continuous Infusions:  dextrose 5% lactated ringers 40 mL/hr at 01/22/24 0700   potassium chloride 10 mEq (01/22/24 0900)   PRN Meds:.docusate sodium,  LORazepam, morphine injection, mouth rinse, polyethylene glycol, sodium chloride flush Medications Prior to Admission:  Prior to Admission medications   Medication Sig Start Date End Date Taking? Authorizing Provider  acetaminophen (TYLENOL) 500 MG tablet Take 1,000 mg by mouth every 8 (eight) hours as needed for mild pain.   Yes [provider]  ALPRAZolam (XANAX) 0.5 MG tablet Take 1 tablet (0.5 mg total) by mouth 3 (three) times daily as needed for anxiety. Patient taking differently: Take 0.5 mg by mouth See admin instructions. Take 0.5 mg by mouth in the morning and an additional 0.5 mg once a day as needed for anxiety 12/13/23  Yes Pickenpack-Cousar, Arty Baumgartner, NP  apixaban (ELIQUIS) 5 MG TABS tablet Take 1 tablet (5 mg total) by mouth 2 (two) times daily. 09/25/23  Yes Jonah Blue, MD  cyanocobalamin (VITAMIN B12) 500 MCG tablet Take 1 tablet (500 mcg total) by mouth daily. 10/23/23 01/31/24 Yes Pokhrel, Laxman, MD  empagliflozin (JARDIANCE) 10 MG TABS tablet Take 1 tablet (10 mg total) by mouth daily. 01/02/24  Yes Standley Brooking, MD  erlotinib (TARCEVA) 100 MG tablet Take 1 tablet (100 mg total) by mouth daily. Take on an empty stomach 1 hour before meals or 2 hours after 12/14/23  Yes Si Gaul, MD  famotidine (PEPCID) 20 MG  tablet Take 1 tablet (20 mg total) by mouth at bedtime. 12/13/23  Yes Pickenpack-Cousar, Arty Baumgartner, NP  folic acid (FOLVITE) 1 MG tablet TAKE 1 TABLET BY MOUTH EVERY DAY 12/22/23  Yes Heilingoetter, Cassandra L, PA-C  HYDROcodone bit-homatropine (HYCODAN) 5-1.5 MG/5ML syrup Take 5 mLs by mouth every 6 (six) hours as needed for cough.   Yes [provider]  HYDROcodone-acetaminophen (NORCO/VICODIN) 5-325 MG tablet Take 1 tablet by mouth every 6 (six) hours as needed. Patient taking differently: Take 1 tablet by mouth every 6 (six) hours as needed (for pain). 09/07/23  Yes Rhetta Mura, MD  loperamide (IMODIUM) 2 MG capsule Take 1 capsule (2 mg total) by mouth as needed for diarrhea or loose stools. 10/23/23  Yes Pokhrel, Laxman, MD  metoprolol succinate (TOPROL XL) 100 MG 24 hr tablet Take 1 tablet (100 mg total) by mouth in the morning and at bedtime. Take with or immediately following a meal. 01/02/24 01/01/25 Yes Standley Brooking, MD  prochlorperazine (COMPAZINE) 10 MG tablet Take 1 tablet (10 mg total) by mouth every 6 (six) hours as needed for nausea or vomiting. 12/15/23  Yes Pickenpack-Cousar, Arty Baumgartner, NP  Simethicone (GAS-X PO) Take 2 tablets by mouth daily as needed (for gas).   Yes [provider]  temazepam (RESTORIL) 30 MG capsule Take 1 capsule (30 mg total) by mouth at bedtime as needed for sleep. Patient taking differently: Take 30 mg by mouth at bedtime. 12/13/23  Yes Pickenpack-Cousar, Arty Baumgartner, NP  dexamethasone (DECADRON) 4 MG tablet Take 1 tablet twice a day the day before, the day of, and the day after chemotherapy Patient not taking: Reported on 01/20/2024 12/13/23   Heilingoetter, Cassandra L, PA-C  losartan (COZAAR) 25 MG tablet Take 0.5 tablets (12.5 mg total) by mouth daily. Patient not taking: Reported on 01/11/2024 01/02/24   Standley Brooking, MD  nystatin (MYCOSTATIN/NYSTOP) powder Apply 1 Application topically 3 (three) times daily. Apply to under breast  for rash if needed. Patient not taking: Reported on 01/20/2024 11/27/23   Namon Cirri E, PA-C  potassium chloride SA (KLOR-CON M) 20 MEQ tablet Take 1 tablet (20 mEq total) by mouth daily for  14 days. Patient not taking: Reported on 01/20/2024 01/02/24 01/20/24  Standley Brooking, MD  bisoprolol-hydrochlorothiazide [facility]) 5-6.25 MG tablet Take  1 tablet  Daily  for BP 06/06/22 07/11/22  [name], MD   Allergies  Allergen Reactions   Biaxin [Clarithromycin] Nausea Only   Review of Systems  Neurological:  Positive for weakness.    Physical Exam Constitutional:      Appearance: She is underweight. She is ill-appearing.  Cardiovascular:     Rate and Rhythm: Tachycardia present.  Pulmonary:     Effort: Pulmonary effort is normal.  Skin:    General: Skin is warm and dry.  Neurological:     Mental Status: She is alert and oriented to person, place, and time.     Vital Signs: BP (!) 144/76   Pulse (!) 124   Temp 99.1 F (37.3 C) (Oral)   Resp (!) 43   Ht 5\' 5"  (1.651 m)   Wt 57.2 kg   SpO2 91%   BMI 20.97 kg/m  Pain Scale: 0-10   Pain Score: 0-No pain   SpO2: SpO2: 91 % O2 Device:SpO2: 91 % O2 Flow Rate: .   IO: Intake/output summary:  Intake/Output Summary (Last 24 hours) at 01/22/2024 1042 Last data filed at 01/22/2024 0745 Gross per 24 hour  Intake 1484.97 ml  Output --  Net 1484.97 ml    LBM: Last BM Date : 01/22/24 Baseline Weight: Weight: 57.2 kg Most recent weight: Weight: 57.2 kg     Palliative Assessment/Data:     Time;  75 minutes    Signed by: Lorinda Creed, NP   Please contact Palliative Medicine Team phone at [phone] for questions and concerns.  For individual provider: See Loretha Stapler

## 2024-01-23 ENCOUNTER — Encounter: Admitting: Dietician

## 2024-01-23 ENCOUNTER — Ambulatory Visit: Payer: No Typology Code available for payment source | Admitting: Internal Medicine

## 2024-01-23 ENCOUNTER — Ambulatory Visit: Payer: No Typology Code available for payment source

## 2024-01-23 ENCOUNTER — Other Ambulatory Visit: Payer: No Typology Code available for payment source

## 2024-01-23 DIAGNOSIS — R58 Hemorrhage, not elsewhere classified: Secondary | ICD-10-CM | POA: Diagnosis not present

## 2024-01-23 DIAGNOSIS — Z515 Encounter for palliative care: Secondary | ICD-10-CM | POA: Diagnosis not present

## 2024-01-23 DIAGNOSIS — D490 Neoplasm of unspecified behavior of digestive system: Secondary | ICD-10-CM | POA: Diagnosis not present

## 2024-01-23 DIAGNOSIS — C3491 Malignant neoplasm of unspecified part of right bronchus or lung: Secondary | ICD-10-CM | POA: Diagnosis not present

## 2024-01-23 DIAGNOSIS — C3490 Malignant neoplasm of unspecified part of unspecified bronchus or lung: Secondary | ICD-10-CM | POA: Diagnosis not present

## 2024-01-23 DIAGNOSIS — Z7189 Other specified counseling: Secondary | ICD-10-CM | POA: Diagnosis not present

## 2024-01-23 LAB — GLUCOSE, CAPILLARY
Glucose-Capillary: 101 mg/dL — ABNORMAL HIGH (ref 70–99)
Glucose-Capillary: 76 mg/dL (ref 70–99)
Glucose-Capillary: 80 mg/dL (ref 70–99)

## 2024-01-23 LAB — BPAM RBC
Blood Product Expiration Date: 202504122359
ISSUE DATE / TIME: 202503240540
Unit Type and Rh: 7300

## 2024-01-23 LAB — COMPREHENSIVE METABOLIC PANEL
ALT: 17 U/L (ref 0–44)
AST: 40 U/L (ref 15–41)
Albumin: 2 g/dL — ABNORMAL LOW (ref 3.5–5.0)
Alkaline Phosphatase: 85 U/L (ref 38–126)
Anion gap: 9 (ref 5–15)
BUN: <5 mg/dL — ABNORMAL LOW (ref 8–23)
CO2: 24 mmol/L (ref 22–32)
Calcium: 8.9 mg/dL (ref 8.9–10.3)
Chloride: 104 mmol/L (ref 98–111)
Creatinine, Ser: 0.71 mg/dL (ref 0.44–1.00)
GFR, Estimated: >60 mL/min (ref >60)
Glucose, Bld: 91 mg/dL (ref 70–99)
Potassium: 3.3 mmol/L — ABNORMAL LOW (ref 3.5–5.1)
Sodium: 137 mmol/L (ref 135–145)
Total Bilirubin: 1.2 mg/dL (ref 0.0–1.2)
Total Protein: 5.6 g/dL — ABNORMAL LOW (ref 6.5–8.1)

## 2024-01-23 LAB — TYPE AND SCREEN
ABO/RH(D): B POS
Antibody Screen: NEGATIVE
Unit division: 0

## 2024-01-23 LAB — CBC
HCT: 24.4 % — ABNORMAL LOW (ref 36.0–46.0)
Hemoglobin: 8 g/dL — ABNORMAL LOW (ref 12.0–15.0)
MCH: 28.7 pg (ref 26.0–34.0)
MCHC: 32.8 g/dL (ref 30.0–36.0)
MCV: 87.5 fL (ref 80.0–100.0)
Platelets: 208 10*3/uL (ref 150–400)
RBC: 2.79 MIL/uL — ABNORMAL LOW (ref 3.87–5.11)
RDW: 18.3 % — ABNORMAL HIGH (ref 11.5–15.5)
WBC: 6.2 10*3/uL (ref 4.0–10.5)
nRBC: 0 % (ref 0.0–0.2)

## 2024-01-23 LAB — PROTIME-INR
INR: 1.6 — ABNORMAL HIGH (ref 0.8–1.2)
Prothrombin Time: 19 s — ABNORMAL HIGH (ref 11.4–15.2)

## 2024-01-23 MED ORDER — POTASSIUM CHLORIDE 10 MEQ/50ML IV SOLN
10.0000 meq | INTRAVENOUS | Status: AC
  Administered 2024-01-23 (×5): 10 meq via INTRAVENOUS
  Filled 2024-01-23 (×6): qty 50

## 2024-01-23 MED ORDER — METOPROLOL TARTRATE 50 MG PO TABS
50.0000 mg | ORAL_TABLET | Freq: Two times a day (BID) | ORAL | Status: DC
  Administered 2024-01-23 (×2): 50 mg via ORAL
  Filled 2024-01-23: qty 2
  Filled 2024-01-23: qty 1

## 2024-01-23 MED ORDER — LOPERAMIDE HCL 2 MG PO CAPS: 2.0000 mg | ORAL_CAPSULE | ORAL | Status: DC | PRN

## 2024-01-23 MED ORDER — TEMAZEPAM 7.5 MG PO CAPS: 30.0000 mg | ORAL_CAPSULE | Freq: Every evening | ORAL | Status: DC | PRN

## 2024-01-23 MED ORDER — POTASSIUM CHLORIDE 10 MEQ/50ML IV SOLN
10.0000 meq | Freq: Once | INTRAVENOUS | Status: AC
  Administered 2024-01-23: 10 meq via INTRAVENOUS

## 2024-01-23 MED ORDER — FAMOTIDINE 20 MG PO TABS
20.0000 mg | ORAL_TABLET | Freq: Every day | ORAL | Status: DC
  Administered 2024-01-23 – 2024-01-25 (×3): 20 mg via ORAL
  Filled 2024-01-23 (×3): qty 1

## 2024-01-23 NOTE — Progress Notes (Signed)
 Patient ID: Rebecca Ochoa, female   DOB: August 17, 1961, 63 y.o.   MRN: [id number]    Progress Note from the Palliative Medicine Team at [facility]   Patient Name: Rebecca Ochoa        Date: 01/23/2024 DOB: 25-Nov-1960  Age: 63 y.o. MRN#: [id number] Attending Physician: Lynnell Catalan, MD Primary Care Physician: Patient, No Pcp Per Admit Date: 01/20/2024   Reason for Consultation/Follow-up   Establishing Goals of Care   HPI/ Brief Hospital Review 63 y.o. 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  Her most recent cancer center/oncology visit was yesterday January 19, 2024 for management of multiple chemotherapy side effects.   She presented to the [facility] emergency room today with throat bleeding reported as signiicant - half coffee cup in morning. And int he ER when she drank water coughed blood again. .  ED workup notable for large left tonsillar tumor and necrotic lymph nodes on CT neck.  In addition patient found to have bilateral pneumothoraces R > L with R 30% but loculated. No distress. And Pulse 93% RA at rest - Pneumothorax is new compared to CT a month earlier with oncology   Noted to have high tumor burden that is progressing   Patient faces treatment option decisions, advanced directive decisions and anticipatory care needs.   Subjective  Extensive chart review has been completed prior to meeting with patient/family  including labs, vital signs, imaging, progress/consult notes, orders, medications and available advance directive documents.    This NP assessed patient at the bedside as a follow up for palliative medicine needs and emotional support.   Patient's significant other/Mike at bedside  Patient is more alert and interactive today.  She denies pain or  discomfort.  Ongoing education regarding her current medical situation.  Both patient and her significant other understand the seriousness of her medical situation.  Again discussed the importance of advanced care planning.  Kathlene November had questions regarding MOST form.  Concepts specific to CODE STATUS, artificial feeding and hydration, and rehospitalization were discussed.   At this time no further delineation of advance care planning decisions made.  Plan of care -Full code      -Educated patient/family to consider DNR/DNI status understanding evidenced based poor outcomes in similar hospitalized patient, as the cause of arrest is likely associated with advanced illness rather than an easily reversible acute cardio-pulmonary event. -Patient is open to all offered and available medical interventions to prolong life - Patient will continue to process and make decisions related to outcomes -PMT will continue to support holistically  For today,  hope is to secure H POA.   Blue book at bedside  Education offered today regarding  the importance of continued conversation with family and their  medical providers regarding overall plan of care and treatment options,  ensuring decisions are within the context of the patients values and GOCs.  Questions and concerns addressed   Discussed with primary team and nursing staff.  Consult placed to spiritual care to assist with advance care planning    Time: 50 minutes  Detailed review of medical records ( labs, imaging, vital signs), medically appropriate exam ( MS, skin, cardiac,  resp)   discussed with treatment team, counseling and education to patient, family, staff, documenting clinical information, medication management, coordination of care  Lorinda Creed NP  Palliative Medicine Team Team Phone # [phone] Pager [phone]

## 2024-01-23 NOTE — Progress Notes (Signed)
 MEWS Progress Note  Patient Details Name: Rebecca Ochoa MRN: [id number] DOB: 12/19/1960 Today's Date: 01/23/2024   MEWS Flowsheet Documentation:  Assess: MEWS Score Temp: 98.2 F (36.8 C) BP: 110/76 MAP (mmHg): 87 Pulse Rate: (!) 125 ECG Heart Rate: (!) 110 Resp: (!) 22 Level of Consciousness: Alert SpO2: 95 % O2 Device: Room Air Patient Activity (if Appropriate): In bed Assess: MEWS Score MEWS Temp: 0 MEWS Systolic: 0 MEWS Pulse: 2 MEWS RR: 1 MEWS LOC: 0 MEWS Score: 3 MEWS Score Color: Yellow Assess: SIRS CRITERIA SIRS Temperature : 0 SIRS Respirations : 1 SIRS Pulse: 1 SIRS WBC: 0 SIRS Score Sum : 2 SIRS Temperature : 0 SIRS Pulse: 1 SIRS Respirations : 1 SIRS WBC: 0 SIRS Score Sum : 2 Assess: if the MEWS score is Yellow or Red Were vital signs accurate and taken at a resting state?: Yes Does the patient meet 2 or more of the SIRS criteria?: Yes Does the patient have a confirmed or suspected source of infection?: No MEWS guidelines implemented : Yes, yellow Treat MEWS Interventions: Considered administering scheduled or prn medications/treatments as ordered Take Vital Signs Increase Vital Sign Frequency : Yellow: Q2hr x1, continue Q4hrs until patient remains green for 12hrs Escalate MEWS: Escalate: Yellow: Discuss with charge nurse and consider notifying provider and/or RRT        Ashok Pall 01/23/2024, 6:23 PM

## 2024-01-23 NOTE — Progress Notes (Signed)
 [facility] ADULT ICU REPLACEMENT PROTOCOL   The patient does apply for the [facility] Adult ICU Electrolyte Replacment Protocol based on the criteria listed below:   1.Exclusion criteria: TCTS, ECMO, Dialysis, and Myasthenia Gravis patients 2. Is GFR >/= 30 ml/min? Yes.    Patient's GFR today is >60 3. Is SCr </= 2? Yes.   Patient's SCr is 0.71 mg/dL 4. Did SCr increase >/= 0.5 in 24 hours? No. 5.Pt's weight >40kg  Yes.   6. Abnormal electrolyte(s): K+ 3.3  7. Electrolytes replaced per protocol 8.  Call MD STAT for K+ </= 2.5, Phos </= 1, or Mag </= 1 Physician:  Larinda Buttery [facility] 01/23/2024 4:10 AM

## 2024-01-23 NOTE — Progress Notes (Signed)
 IVT consult received for cap and flush PAC. Advised RN to leave the line at [facility] to decrease the risk for infection due to attaching and detaching the IV tubing. RN agreeable.

## 2024-01-23 NOTE — Progress Notes (Signed)
 This chaplain responded to PMT [facility] consult for creating/updating the Pt. Advance Directive: HCPOA only. The Pt. is not creating a Living Will.   The Pt. named Rebecca Ochoa as her health care agent after completeing AD education and answering the chaplain's clarifying questions.    The chaplain is present with the Pt., [name], notary, and witnesses for the notarizing of the Pt. Advance Directive. The chaplain gave the Pt. the original AD along with one copy. The chaplain scanned the Pt. AD into the Pt. EMR.  This chaplain is available for F/U spiritual care as needed.  Chaplain Stephanie Acre [phone]

## 2024-01-23 NOTE — Progress Notes (Addendum)
 NAME:  Rebecca Ochoa, MRN:  [id number], DOB:  12-Mar-1961, LOS: 3 ADMISSION DATE:  01/20/2024, CONSULTATION DATE:  12/31/23 REFERRING MD:  Dr Shawna Orleans BELFI, CHIEF COMPLAINT:  ORal BLeeding from tonsillar mass *. Also R > L Loculated Ptx. Known lung cancer  PCP Patient, No Pcp Per Onc:L Mohamed Primary Pulm:  [place]  History of Present Illness:   63 y.o. 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  Her most recent cancer center/oncology visit was yesterday January 19, 2024 for management of multiple chemotherapy side effects.   She presented to the [facility] emergency room today with throat bleeding reported as signiicant - half coffee cup in morning. And int he ER when she drank water coughed blood again. .  ED workup notable for large left tonsillar tumor and necrotic lymph nodes on CT neck.  In addition patient found to have bilateral pneumothoraces R > L with R 30% but loculated. No distress. And Pulse 93% RA at rest - Pneumothorax is new compared to CT a month earlier with oncology Noted to have high tumor burden that is progressing. PE Nov 2024 on eliquis Targisso stopped due to long QTc   Past Medical History:  Stage IVb non-small cell lung cancer, oropharynx tonsillar cancer, asthma, hyperlipidemia, vitamin D deficiency  Past Surgical History:  Procedure Laterality Date   BREAST SURGERY     reduction   BRONCHIAL BIOPSY  08/08/2023   Procedure: BRONCHIAL BIOPSIES;  Surgeon: Leslye Peer, MD;  Location: [facility] ENDOSCOPY;  Service: Pulmonary;;   BRONCHIAL BRUSHINGS  08/08/2023   Procedure: BRONCHIAL BRUSHINGS;  Surgeon: Leslye Peer, MD;  Location: [facility] ENDOSCOPY;  Service: Pulmonary;;   BRONCHIAL NEEDLE ASPIRATION BIOPSY  08/08/2023   Procedure: BRONCHIAL NEEDLE ASPIRATION BIOPSIES;  Surgeon:  Leslye Peer, MD;  Location: [facility] ENDOSCOPY;  Service: Pulmonary;;   ENDOBRONCHIAL ULTRASOUND Bilateral 08/08/2023   Procedure: ENDOBRONCHIAL ULTRASOUND;  Surgeon: Leslye Peer, MD;  Location: [facility] ENDOSCOPY;  Service: Pulmonary;  Laterality: Bilateral;   HEMOSTASIS CONTROL  08/08/2023   Procedure: HEMOSTASIS CONTROL;  Surgeon: Leslye Peer, MD;  Location: [facility] ENDOSCOPY;  Service: Pulmonary;;   IR IMAGING GUIDED PORT INSERTION  09/01/2023   REDUCTION MAMMAPLASTY     TUBAL LIGATION       Current Facility-Administered Medications:    Chlorhexidine Gluconate Cloth 2 % PADS 6 each, 6 each, Topical, Daily, Kalman Shan, MD, 6 each at 01/23/24 1000   docusate sodium (COLACE) capsule 100 mg, 100 mg, Oral, BID PRN, Kalman Shan, MD   enoxaparin (LOVENOX) injection 40 mg, 40 mg, Subcutaneous, Q24H, Constance Whittle, MD, 40 mg at 01/22/24 1145   famotidine (PEPCID) tablet 20 mg, 20 mg, Oral, QHS, Amarien Carne, MD   feeding supplement (ENSURE ENLIVE / ENSURE PLUS) liquid 237 mL, 237 mL, Oral, BID BM, Agarwala, Ravi, MD   LORazepam (ATIVAN) injection 1 mg, 1 mg, Intravenous, Q4H PRN, Paliwal, Aditya, MD, 1 mg at 01/22/24 1142   metoprolol tartrate (LOPRESSOR) tablet 50 mg, 50 mg, Oral, BID, Agarwala, Ravi, MD, 50 mg at 01/23/24 1032   morphine (PF) 2 MG/ML injection 2 mg, 2 mg, Intravenous, Q3H PRN, Lynnell Catalan, MD, 2 mg at 01/21/24 1208   Oral care mouth rinse, 15 mL, Mouth Rinse, PRN, Marchelle Gearing, Murali, MD   polyethylene glycol (MIRALAX / GLYCOLAX) packet 17 g, 17 g, Oral,  Daily PRN, Kalman Shan, MD   potassium chloride 10 mEq in 50 mL *CENTRAL LINE* IVPB, 10 mEq, Intravenous, Q1 Hr x 6, Paliwal, Aditya, MD, Last Rate: 50 mL/hr at 01/23/24 1035, 10 mEq at 01/23/24 1035   sodium chloride flush (NS) 0.9 % injection 10-40 mL, 10-40 mL, Intracatheter, PRN, Kalman Shan, MD  Facility-Administered Medications Ordered in Other Encounters:    0.9 %  sodium chloride infusion  (Manually program via Guardrails IV Fluids), 250 mL, Intravenous, Continuous, Heilingoetter, Cassandra L, PA-C, Stopped at 12/13/23 1658   0.9 %  sodium chloride infusion, , Intravenous, Continuous, Si Gaul, MD, Stopped at 12/13/23 1526  Significant Hospital Events:  3/22 -admitted to [facility] long for bleeding from tonsillar mass.  Transferred to [facility] in anticipation of embolization.   Subjective  Tolerated few bites of soft diet, no recent tonsillar bleeding.  Objective   Blood pressure 114/77, pulse (!) 115, temperature 99.7 F (37.6 C), temperature source Oral, resp. rate (!) 25, height 5\' 5"  (1.651 m), weight 57.2 kg, SpO2 92%.     Intake/Output Summary (Last 24 hours) at 01/23/2024 1128 Last data filed at 01/23/2024 0600 Gross per 24 hour  Intake 1045.52 ml  Output --  Net 1045.52 ml   Examination: General: Frail and cachectic woman sitting in bed in no distress. HENT: Left-sided neck mass.  No tonsillar bleeding. lungs: Chest clear bilaterally with equal air entry.  Trachea is midline. Cardiovascular: Normal heart sounds Abdomen: Soft Extremities: Intact Neuro: AxXOx 3. Speech normal  Ancillary tests personally reviewed  K3.3 Hb 8.0 INR 1.8 >>1.6  Assessment & Plan:   Bilateral pneumothoraces due to increasing tumor burden right greater than left. Bleeding tonsillar mass, likely exacerbated by Eliquis use for pulmonary embolism. Stage IVb non-small cell lung cancer, poorly differentiated squamous cell Currently receiving palliative chemotherapy with Tarceva and carboplatin/Alimta Cancer related cachexia Tonsillar bleeding has resolved, Hb stable at 8.0, INR therapeutic. Intervention to definitively embolize the tonsillar mass comes with the risk of worsening the right-sided pneumothorax and of long-term ventilator dependency in a woman who already is in declining health.  Patient and boyfriend having ongoing GOC conversations with palliative. She is  medically ready to leave ICU to medical floor.  Plan: - Soft diet, advance diet as tolerated. - PT treat and eval. - K 3.3, IV runs of potassium.  - CBC, BMP. - Switch from IV metoprolol to p.o. 50 mg twice daily. - Could consider Megace for appetite stimulation.  Best practice (daily eval):  Diet:  soft diet, advance diet as tolerated.   DVT prophylaxis: Lovenox GI prophylaxis: Not applicable Glucose control: na Code Status: full code Family Communication: Patient updated at bedside.  Laretta Bolster, M.D.  Internal Medicine Resident, PGY-1 [facility]  Pager: [phone] 11:28 AM, 01/23/2024

## 2024-01-24 DIAGNOSIS — C787 Secondary malignant neoplasm of liver and intrahepatic bile duct: Secondary | ICD-10-CM | POA: Diagnosis not present

## 2024-01-24 DIAGNOSIS — R58 Hemorrhage, not elsewhere classified: Secondary | ICD-10-CM | POA: Diagnosis not present

## 2024-01-24 DIAGNOSIS — C3492 Malignant neoplasm of unspecified part of left bronchus or lung: Secondary | ICD-10-CM | POA: Diagnosis not present

## 2024-01-24 DIAGNOSIS — C3491 Malignant neoplasm of unspecified part of right bronchus or lung: Secondary | ICD-10-CM | POA: Diagnosis not present

## 2024-01-24 LAB — COMPREHENSIVE METABOLIC PANEL
ALT: 17 U/L (ref 0–44)
AST: 40 U/L (ref 15–41)
Albumin: 2 g/dL — ABNORMAL LOW (ref 3.5–5.0)
Alkaline Phosphatase: 88 U/L (ref 38–126)
Anion gap: 9 (ref 5–15)
BUN: 5 mg/dL — ABNORMAL LOW (ref 8–23)
CO2: 25 mmol/L (ref 22–32)
Calcium: 8.9 mg/dL (ref 8.9–10.3)
Chloride: 101 mmol/L (ref 98–111)
Creatinine, Ser: 0.74 mg/dL (ref 0.44–1.00)
GFR, Estimated: >60 mL/min (ref >60)
Glucose, Bld: 81 mg/dL (ref 70–99)
Potassium: 3.6 mmol/L (ref 3.5–5.1)
Sodium: 135 mmol/L (ref 135–145)
Total Bilirubin: 1.1 mg/dL (ref 0.0–1.2)
Total Protein: 5.9 g/dL — ABNORMAL LOW (ref 6.5–8.1)

## 2024-01-24 LAB — CBC WITH DIFFERENTIAL/PLATELET
Abs Immature Granulocytes: 0.02 10*3/uL (ref 0.00–0.07)
Basophils Absolute: 0 10*3/uL (ref 0.0–0.1)
Basophils Relative: 0 %
Eosinophils Absolute: 0.5 10*3/uL (ref 0.0–0.5)
Eosinophils Relative: 8 %
HCT: 25.6 % — ABNORMAL LOW (ref 36.0–46.0)
Hemoglobin: 8.2 g/dL — ABNORMAL LOW (ref 12.0–15.0)
Immature Granulocytes: 0 %
Lymphocytes Relative: 11 %
Lymphs Abs: 0.7 10*3/uL (ref 0.7–4.0)
MCH: 28.5 pg (ref 26.0–34.0)
MCHC: 32 g/dL (ref 30.0–36.0)
MCV: 88.9 fL (ref 80.0–100.0)
Monocytes Absolute: 0.6 10*3/uL (ref 0.1–1.0)
Monocytes Relative: 10 %
Neutro Abs: 4.4 10*3/uL (ref 1.7–7.7)
Neutrophils Relative %: 71 %
Platelets: 222 10*3/uL (ref 150–400)
RBC: 2.88 MIL/uL — ABNORMAL LOW (ref 3.87–5.11)
RDW: 18.6 % — ABNORMAL HIGH (ref 11.5–15.5)
WBC: 6.2 10*3/uL (ref 4.0–10.5)
nRBC: 0 % (ref 0.0–0.2)

## 2024-01-24 LAB — GLUCOSE, CAPILLARY
Glucose-Capillary: 87 mg/dL (ref 70–99)
Glucose-Capillary: 86 mg/dL (ref 70–99)

## 2024-01-24 MED ORDER — LOPERAMIDE HCL 2 MG PO CAPS
2.0000 mg | ORAL_CAPSULE | Freq: Three times a day (TID) | ORAL | Status: AC
  Administered 2024-01-24: 2 mg via ORAL
  Filled 2024-01-24 (×2): qty 1

## 2024-01-24 MED ORDER — METOPROLOL TARTRATE 50 MG PO TABS
75.0000 mg | ORAL_TABLET | Freq: Two times a day (BID) | ORAL | Status: DC
  Administered 2024-01-24 – 2024-01-25 (×3): 75 mg via ORAL
  Filled 2024-01-24 (×3): qty 1

## 2024-01-24 MED ORDER — ALPRAZOLAM 0.5 MG PO TABS
0.5000 mg | ORAL_TABLET | Freq: Three times a day (TID) | ORAL | Status: DC | PRN
  Administered 2024-01-24 – 2024-01-25 (×3): 0.5 mg via ORAL
  Filled 2024-01-24 (×3): qty 1

## 2024-01-24 MED ORDER — TEMAZEPAM 7.5 MG PO CAPS
30.0000 mg | ORAL_CAPSULE | Freq: Every day | ORAL | Status: DC
  Administered 2024-01-24 – 2024-01-25 (×2): 30 mg via ORAL
  Filled 2024-01-24 (×2): qty 4

## 2024-01-24 NOTE — Evaluation (Signed)
 Physical Therapy Evaluation Patient Details Name: Teiara Baria MRN: [id number] DOB: 1961/08/06 Today's Date: 01/24/2024  History of Present Illness  63 y.o. female admitted with oral bleeding from tonsillar mass. Pt also with R > L pneumothorax PMH: Stage IV Non small cell lung cancer on systemic chemotherapy and immunotherapy, as well as concurrent left oropharynx tonsillar cancer, anemia, asthma, GERD, HTN, HLD, vit D deficiency   Clinical Impression  Pt admitted with above. Pt with terminal lunch cancer and reports only ambulating in home to/from bathroom with exception of going to MD appts. Pt reports generalized weakness and poor activity tolerance as noted on eval as well. Mobility limited this date due to HR inc to 145bpm and noted SOB with mobility. I do not anticipate pt to need follow up PT upon d/c however acute PT will continue to follow to address ambulation distance and necessary stair negotiation to enter home.        If plan is discharge home, recommend the following: A little help with walking and/or transfers;A little help with bathing/dressing/bathroom;Assist for transportation;Help with stairs or ramp for entrance;Assistance with cooking/housework   Can travel by private vehicle        Equipment Recommendations None recommended by PT  Recommendations for Other Services       Functional Status Assessment Patient has had a recent decline in their functional status and demonstrates the ability to make significant improvements in function in a reasonable and predictable amount of time.     Precautions / Restrictions Precautions Precautions: Fall Restrictions Weight Bearing Restrictions Per Provider Order: No      Mobility  Bed Mobility Overal bed mobility: Modified Independent             General bed mobility comments: sleeps in recliner at home, [facility] elevated, used bed rail, no physical assist, increased time    Transfers Overall transfer level: Modified  independent Equipment used: None               General transfer comment: slow, guarded but stedy, no physical assist    Ambulation/Gait Ambulation/Gait assistance: Contact guard assist Gait Distance (Feet): 40 Feet Assistive device: IV Pole Gait Pattern/deviations: Step-through pattern, Decreased stride length Gait velocity: dec Gait velocity interpretation: <1.31 ft/sec, indicative of household ambulator   General Gait Details: short amb tolerance, HR increased to 145bpm, pt denies feeling her heart "racing" but reported increased difficulty breathing.  Stairs            Wheelchair Mobility     Tilt Bed    Modified Rankin (Stroke Patients Only)       Balance Overall balance assessment: Mild deficits observed, not formally tested                                           Pertinent Vitals/Pain Pain Assessment Pain Assessment: No/denies pain    Home Living Family/patient expects to be discharged to:: Private residence Living Arrangements: Spouse/significant other Available Help at Discharge: Family;Available 24 hours/day Type of Home: House Home Access: Stairs to enter Entrance Stairs-Rails: None Entrance Stairs-Number of Steps: 3   Home Layout: Multi-level;Able to live on main level with bedroom/bathroom Home Equipment: Rollator (4 wheels);Shower Counsellor (2 wheels);Cane - single point;BSC/3in1 Additional Comments: sleeps in recliner    Prior Function Prior Level of Function : Needs assist  Mobility Comments: Ambulates in home with rollator; assist with stairs ADLs Comments: patient has help from significant other for ADLs as needed.     Extremity/Trunk Assessment   Upper Extremity Assessment Upper Extremity Assessment: Generalized weakness    Lower Extremity Assessment Lower Extremity Assessment: Generalized weakness    Cervical / Trunk Assessment Cervical / Trunk Assessment: Other  exceptions Cervical / Trunk Exceptions: frail, port in L chest for chemo  Communication   Communication Communication: No apparent difficulties    Cognition Arousal: Alert Behavior During Therapy: WFL for tasks assessed/performed   PT - Cognitive impairments: No apparent impairments                         Following commands: Intact       Cueing Cueing Techniques: Verbal cues     General Comments General comments (skin integrity, edema, etc.): HR in 120s at rest, inc to 145bpm during amb, RN made aware, pt recovered to 120s bpm s/p 3 min rest    Exercises     Assessment/Plan    PT Assessment Patient needs continued PT services  PT Problem List Decreased strength;Decreased activity tolerance;Decreased mobility;Decreased balance       PT Treatment Interventions DME instruction;Gait training;Stair training;Functional mobility training;Therapeutic activities;Therapeutic exercise;Balance training    PT Goals (Current goals can be found in the Care Plan section)  Acute Rehab PT Goals Patient Stated Goal: return home PT Goal Formulation: With patient Time For Goal Achievement: 02/07/24 Potential to Achieve Goals: Good    Frequency Min 2X/week     Co-evaluation               AM-PAC PT "6 Clicks" Mobility  Outcome Measure Help needed turning from your back to your side while in a flat bed without using bedrails?: None Help needed moving from lying on your back to sitting on the side of a flat bed without using bedrails?: None Help needed moving to and from a bed to a chair (including a wheelchair)?: None Help needed standing up from a chair using your arms (e.g., wheelchair or bedside chair)?: None Help needed to walk in hospital room?: A Little Help needed climbing 3-5 steps with a railing? : A Little 6 Click Score: 22    End of Session   Activity Tolerance: Patient limited by fatigue Patient left: in bed;with call bell/phone within reach Nurse  Communication: Mobility status (HR inc and SOB) PT Visit Diagnosis: Unsteadiness on feet (R26.81);Muscle weakness (generalized) (M62.81)    Time: 8119-1478 PT Time Calculation (min) (ACUTE ONLY): 27 min   Charges:   PT Evaluation $PT Eval Low Complexity: 1 Low PT Treatments $Gait Training: 8-22 mins PT General Charges $$ ACUTE PT VISIT: 1 Visit         Lewis Shock, PT, DPT Acute Rehabilitation Services Secure chat preferred Office #: [phone]   Iona Hansen 01/24/2024, 9:57 AM

## 2024-01-24 NOTE — Progress Notes (Signed)
 TRH ROUNDING NOTE Lynzee Lindquist ZOX:[id number]  DOB: 1961/09/12  DOA: 01/20/2024  PCP: Patient, No Pcp Per  01/24/2024,7:26 AM  LOS: 4 days    Code Status: Full code   from: Home current Dispo: Unclear   63 year old white female known anemia asthma reflux migraine breast reduction surgery tage IVb T3N2M1C 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 + large necrotic R mediastinal mass and various liver and bone mets as well as celiac plexus lymphadenopathy -? priorcarboplatin paclitaxel Keytruda 11/6- -- has received 14 fractions XRT per radiation oncology Chronic diarrhea secondary to Tagrisso which was stopped because of long QT Sinus tachycardia, chemo induced pancytopenia Questionable adrenal insufficiency on steroids Submassive pulmonary emboli 11/4 Recent hospitalization 2/25 through 01/02/24 with tachycardia cardiology consulted felt to be from stress cardiomyopathy ischemia not excluded  01/20/2024 throat bleeding half coffee cup vomited blood again-ED workup showed large left tonsillar tumor necrotic lymph nodes on CT neck and found to have bilateral pneumothorax R >L--- hemoglobin was 8.0 platelet 283 white count 8.7 sodium 135 potassium 3.2 BUNs/creatinine 13/0.7 she was given 2 units of PRBC this hospital stay CT neck showed interval increase heterogeneous mass involving the left palatine tonsil now up to 3.2 cm previously 2.3 mass abuts and possibly involve soft palate left aspect of base of tongue narrowing and rightward deviation of oropharyngeal airway is increased from prior, interval development of enlarging enhancing left level 2A cervical node with similar characteristics to the left tonsillar mass 1.6 cm right thyroid nodule Interval development of bilateral pneumothoraces right greater than sign left CXR 30% right pneumothorax partially loculated less than 5% apical pneumothorax CT chest confirmed 30% hydropneumothorax right upper lobe tethered to pleura by peripheral 3.6 cm  mass, 10% left pneumothorax with some dependent pleural density scattered nodules and masses in both lungs compatible with metastatic disease-dominant right hilar mass 4.3 cm in diameter-indistinct mass in left hepatic lobe compatible with metastatic disease  patient was given Andexxa stat ENT was noted they recommended transfer to [facility] for IR eval for embolization of arterial supply to the tonsil She was seen by them on 3/23 and it was decided to hold on embolization as she did not bleed   Plan  Tonsillar bleeding from poorly differentiated metastatic squamous cell CA No further bleeding- hemoglobin has stabilized in the 8 range after stabilized patient with Andexxa given earlier during hospital stay  Transfusion threshold is below 7   stage IVb poorly differentiated squamous cell CA RUL with mets  not a candidate for further therapy apparently Will add Dr. Shirline Frees to the treatment team--prior has been on Tarceva 100 Tagroisso held 2/2 prolonged Qtc  Chronic sinus tachycardia Underlying system heart failure 01/09/2024 showed EF 35-40% with moderately decreased function-showed small pericardial effusion no tamponade--she has a stress test due and will follow-up with Dr. Wyline Mood Admitted for the same previously and seen in February for this Will increase metoprolol from 50 twice daily to 75 twice daily and monitor blood pressures [previously was on 100 twice daily]--- holding losartan as was not taking at home May need outpatient further workup  Mild hypokalemia Labs not repeated  Submassive pulmonary emboli previously Not a candidate for anticoagulation currently-monitor trends of hemoglobin  HTN Losartan 12.5 mg on hold given moderate hypotension  Diarrhea Probably secondary to nutritional shakes in addition to other meds Will resume Imodium  Depression/anxiety-resume Xanax 0.5 3 times daily as needed, temazepam 30 at bedtime as needed sleep   DVT prophylaxis: SCD at this  time given high risk of bleed  Status is: Inpatient Remains inpatient appropriate because:   Requires further management   Subjective: Awake coherent no distress No nasal bleeding no bleeding from mouth no chest pain no fever no chills no rigor Going to the restroom rather frequently 4 times a day Asking about her Jardiance  Objective + exam Vitals:   01/23/24 2041 01/24/24 0048 01/24/24 0457 01/24/24 0500  BP: 126/86 127/64 114/74   Pulse: (!) 127 (!) 104 (!) 117   Resp: 18 16 16    Temp: 98.7 F (37.1 C) 98.7 F (37.1 C) 99.1 F (37.3 C)   TempSrc: Oral Oral Oral   SpO2: 95% 92% 95%   Weight:    57.2 kg  Height:       Filed Weights   01/20/24 0959 01/24/24 0500  Weight: 57.2 kg 57.2 kg    Examination: EOMI NCAT no focal deficit no icterus no pallor no wheeze no rales no rhonchi CTAB no added sound S1-S2 tachycardic Port in place in the left chest Abdomen soft no rebound Rather cachectic ROM intact  Data Reviewed: reviewed   CBC    Component Value Date/Time   WBC 6.2 01/23/2024 0331   RBC 2.79 (L) 01/23/2024 0331   HGB 8.0 (L) 01/23/2024 0331   HGB 9.2 (L) 01/19/2024 1420   HCT 24.4 (L) 01/23/2024 0331   PLT 208 01/23/2024 0331   PLT 373 01/19/2024 1420   MCV 87.5 01/23/2024 0331   MCH 28.7 01/23/2024 0331   MCHC 32.8 01/23/2024 0331   RDW 18.3 (H) 01/23/2024 0331   LYMPHSABS 0.6 (L) 01/20/2024 1110   MONOABS 0.6 01/20/2024 1110   EOSABS 0.5 01/20/2024 1110   BASOSABS 0.0 01/20/2024 1110      Latest Ref Rng & Units 01/23/2024    3:31 AM 01/22/2024    2:25 PM 01/22/2024    3:45 AM  CMP  Glucose 70 - 99 mg/dL 91  94  045   BUN 8 - 23 mg/dL 5  5  6    Creatinine 0.44 - 1.00 mg/dL 4.09  8.11  9.14   Sodium 135 - 145 mmol/L 137  135  139   Potassium 3.5 - 5.1 mmol/L 3.3  3.5  3.1   Chloride 98 - 111 mmol/L 104  102  106   CO2 22 - 32 mmol/L 24  23  24    Calcium 8.9 - 10.3 mg/dL 8.9  9.0  8.8   Total Protein 6.5 - 8.1 g/dL 5.6   5.8   Total  Bilirubin 0.0 - 1.2 mg/dL 1.2   1.1   Alkaline Phos 38 - 126 U/L 85   88   AST 15 - 41 U/L 40   42   ALT 0 - 44 U/L 17   19     Scheduled Meds:  Chlorhexidine Gluconate Cloth  6 each Topical Daily   enoxaparin (LOVENOX) injection  40 mg Subcutaneous Q24H   famotidine  20 mg Oral QHS   feeding supplement  237 mL Oral BID BM   metoprolol tartrate  75 mg Oral BID   Continuous Infusions:  Time  44  Rhetta Mura, MD  [facility]

## 2024-01-24 NOTE — Progress Notes (Signed)
   01/24/24 1526  TOC Brief Assessment  Insurance and Status Reviewed  Patient has primary care physician No (see note)  Home environment has been reviewed home  Prior level of function: independent  Prior/Current Home Services No current home services  Social Drivers of Health Review SDOH reviewed no interventions necessary  Readmission risk has been reviewed Yes  Transition of care needs no transition of care needs at this time (see note)     Spoke to patient at bedside. PT recommendations : No PT follow up no DME.   Patient's PCP was Dr Oneta Rack who past away , they are closing office.  Patient currently does not have a PCP. Discussed she can call number on insurance card or go to insurance website and be provided a list of PCP in network and chose from there. Patient voiced understanding

## 2024-01-24 NOTE — Progress Notes (Signed)
 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  Indistinct masses in the left hepatic lobe compatible with metastatic disease.  Stable pathologic peripancreatic adenopathy. - Status post palliative radiation therapy.  Status post Edgar Frisk which was discontinued February 2025 due to QT prolongation. - She has been on carbo/Alimta x 2 cycles.  More recently was started on Tarceva 100 mg p.o. daily.  She was last seen in outpatient oncology 12/29/2023. - Recommend Palliative eval for goals of care discussion.  Initiated conversation with patient today, she is "unsure" of what she wants to do.  States that she would like to talk to Dr. Arbutus Ped - Medical oncology/Dr. Arbutus Ped following  Hemoptysis/throat bleeding - Patient reports half a coffee cup of blood from her throat prior to admission -Secondary to malignancy - CT of neck done 01/20/2024 shows several increase in size of heterogeneous mass involving left palate teen tonsil now 3.2 cm, previously 2.3 cm. - Monitor CBC - Continue supportive care  Anemia - Hemoglobin low 8.0 - Likely due to malignancy and malnutrition - Transfuse PRBC for hemoglobin <7.5.  No transfusional intervention required at this time - Continue to monitor CBC with differential     Code Status Full  Subjective:  Patient seen awake and alert laying in bed.  Patient's spouse is at bedside.  Reports that her appetite has been very poor.  Admits to moderate pain in her sides and back.  States that she has been spitting up blood intermittently.  Admits to feeling very tired.  Objective:  Vitals:   01/24/24 0826 01/24/24 1056  BP: 121/86 114/82  Pulse: (!) 121 (!) 124   Resp: 16   Temp: 98.2 F (36.8 C)   SpO2: 96%      Intake/Output Summary (Last 24 hours) at 01/24/2024 1329 Last data filed at 01/23/2024 1800 Gross per 24 hour  Intake 340 ml  Output --  Net 340 ml     REVIEW OF SYSTEMS:   Constitutional: + Fatigue, + generalized weakness denies fevers, chills or abnormal night sweats Eyes: Denies blurriness of vision, double vision or watery eyes Ears, nose, mouth, throat, and face: + Hemoptysis Respiratory: Denies cough, dyspnea or wheezes Cardiovascular: Denies palpitation, chest discomfort or lower extremity swelling Gastrointestinal:  Denies nausea, heartburn or change in bowel habits Skin: Denies abnormal skin rashes Lymphatics: Denies new lymphadenopathy or easy bruising Neurological: Denies numbness, tingling or new weaknesses Behavioral/Psych: Mood is stable, no new changes  All other systems were reviewed with the patient and are negative.  PHYSICAL EXAMINATION: ECOG PERFORMANCE STATUS: 3 - Symptomatic, >50% confined to bed  Vitals:   01/24/24 0826 01/24/24 1056  BP: 121/86 114/82  Pulse: (!) 121 (!) 124  Resp: 16   Temp: 98.2 F (36.8 C)   SpO2: 96%    Filed Weights   01/20/24 0959 01/24/24 0500  Weight: 126 lb (57.2 kg) 126 lb 0.2 oz (57.2 kg)    GENERAL: alert, + weak appearing SKIN: + Pale skin color, texture, turgor are normal, no rashes or significant lesions EYES: normal, conjunctiva are pink and non-injected, sclera clear OROPHARYNX: no exudate, no erythema and lips, buccal mucosa, and tongue normal  NECK: supple, thyroid normal size, non-tender,  without nodularity LYMPH: no palpable lymphadenopathy in the cervical, axillary or inguinal LUNGS: + Diminished to auscultation HEART: regular rate & rhythm and no murmurs and no lower extremity edema ABDOMEN: abdomen soft, non-tender and normal bowel sounds MUSCULOSKELETAL: no cyanosis of digits and no clubbing  PSYCH: alert & oriented x 3 with fluent speech NEURO:  no focal motor/sensory deficits   All questions were answered. The patient knows to call the clinic with any problems, questions or concerns.   The total time spent in the appointment was 40 minutes encounter with patient including review of chart and various tests results, discussions about plan of care and coordination of care plan  Dawson Bills, NP 01/24/2024 1:29 PM    Labs Reviewed:  Lab Results  Component Value Date   WBC 6.2 01/23/2024   HGB 8.0 (L) 01/23/2024   HCT 24.4 (L) 01/23/2024   MCV 87.5 01/23/2024   PLT 208 01/23/2024   Recent Labs    08/16/23 1036 08/24/23 0819 09/21/23 0444 09/24/23 0500 10/19/23 0712 10/20/23 0742 01/19/24 1420 01/20/24 1110 01/22/24 0345 01/22/24 1425 01/23/24 0331  NA 133*   < > 137   < > 135   < > 136   < > 139 135 137  K 3.4*   < > 3.4*   < > 3.3*   < > 3.4*   < > 3.1* 3.5 3.3*  CL 93*   < > 105   < > 101   < > 98   < > 106 102 104  CO2 29   < > 23   < > 27   < > 27   < > 24 23 24   GLUCOSE 118*   < > 95   < > 120*   < > 96   < > 101* 94 91  BUN 16   < > 6*   < > 18   < > 14   < > 6* 5* <5*  CREATININE 0.85   < > 0.59   < > 0.48   < > 0.79   < > 0.74 0.65 0.71  CALCIUM 9.0   < > 8.2*   < > 8.7*   < > 9.6   < > 8.8* 9.0 8.9  GFRNONAA >60   < > >60   < > >60   < > >60   < > >60 >60 >60  PROT 7.0   < >  --    < > 5.0*   < > 7.7  --  5.8*  --  5.6*  ALBUMIN 3.3*   < >  --    < > 2.6*   < > 3.2*  --  2.1*  --  2.0*  AST 35   < >  --    < > 26   < > 60*  --  42*  --  40  ALT 25   < >  --    < > 61*   < > 28  --  19  --  17  ALKPHOS 91   < >  --    < > 97   < > 133*  --  88  --  85  BILITOT 1.0   < > 0.7   < > 1.1   < > 1.1  --  1.1  --  1.2  BILIDIR 0.3*  --  <0.1  --  0.3*  --   --   --   --   --   --  IBILI 0.7  --  NOT CALCULATED  --  0.8  --   --   --   --   --   --    < > = values in this interval not displayed.    Studies Reviewed:  DG Chest Port 1 View Result Date: 01/21/2024 CLINICAL DATA:  63 year old female with  metastatic non-small cell lung cancer and right hydropneumothorax. EXAM: PORTABLE CHEST 1 VIEW COMPARISON:  Portable chest yesterday and earlier. FINDINGS: Portable AP upright view at 0542 hours. Moderate sized right hydropneumothorax persists with adhesion of right lung or lung tumor to the right upper and lateral pleura as before. Stable lung volumes and ventilation. Stable cardiac size and mediastinal contours. Stable left chest power port. Underlying rounded pulmonary metastases. Trace left hydropneumothorax no longer apparent. Negative visible bowel gas.  Stable visualized osseous structures. IMPRESSION: 1. Moderate right hydropneumothorax superimposed on right lung tumor and metastases stable from yesterday. 2. Smaller left hydropneumothorax no longer radiographically apparent. 3. No new cardiopulmonary abnormality. Electronically Signed   By: Odessa Fleming M.D.   On: 01/21/2024 08:52   DG Chest Port 1 View Result Date: 01/20/2024 CLINICAL DATA:  Follow-up pneumothorax EXAM: PORTABLE CHEST 1 VIEW COMPARISON:  01/20/2024, CT 01/20/2024, chest x-ray 03/19/2024 FINDINGS: Left-sided central venous port tip at the cavoatrial junction. No significant change in small left apical pneumothorax, demonstrating less than 1 cm pleural-parenchymal separation. Similar moderate size right pneumothorax without shift. Pulmonary nodules and masses corresponding to history of malignancy and metastatic disease. Stable cardiac size. IMPRESSION: 1. No significant change in small left apical pneumothorax and moderate size right pneumothorax. 2. Pulmonary nodules and masses corresponding to history of malignancy and metastatic disease. Electronically Signed   By: Jasmine Pang M.D.   On: 01/20/2024 21:27   CT CHEST WO CONTRAST Result Date: 01/20/2024 CLINICAL DATA:  Non-small cell lung cancer, bilateral pneumothoraces. * Tracking Code: BO * EXAM: CT CHEST WITHOUT CONTRAST TECHNIQUE: Multidetector CT imaging of the chest was performed  following the standard protocol without IV contrast. RADIATION DOSE REDUCTION: This exam was performed according to the departmental dose-optimization program which includes automated exposure control, adjustment of the mA and/or kV according to patient size and/or use of iterative reconstruction technique. COMPARISON:  Neck CT 01/20/2024 and CTA chest 12/26/2023 FINDINGS: Cardiovascular: Left Port-A-Cath tip: Cavoatrial junction. Coronary, aortic arch, and branch vessel atherosclerotic vascular disease. Mediastinum/Nodes: Aside from the right hilar mass there is little in the way of thoracic adenopathy. Lungs/Pleura: 30% right pneumothorax. Some of the right upper lobe may be tethered to the pleura by a peripheral 3.6 cm mass on image 28 series 4. Small amount of complex pleural fluid making this a hydropneumothorax. 10% left pneumothorax noted likewise with some dependent pleural density potentially from trace pleural fluid and with some loculated pneumothorax posteriorly along the posterior costophrenic angle. Scattered nodules and masses in both lungs. Dominant right hilar mass about 4.3 cm in diameter. Upper Abdomen: Abdominal aortic atherosclerosis. Indistinct masses in the left hepatic lobe. Pathologic peripancreatic node 1.5 cm in short axis, image 50 series 2. Musculoskeletal: Mild thoracic kyphosis. IMPRESSION: 1. 30% right hydropneumothorax. Some of the right upper lobe may be tethered to the pleura by a peripheral 3.6 cm mass. 2. 10% left pneumothorax with some dependent pleural density potentially from trace pleural fluid and with some loculated pneumothorax posteriorly along the posterior costophrenic angle. 3. Scattered nodules and masses in both lungs, compatible with metastatic disease. Dominant right hilar mass about 4.3 cm in  diameter. 4. Indistinct masses in the left hepatic lobe, compatible with metastatic disease. Stable pathologic peripancreatic adenopathy. 5. Coronary, aortic arch, and branch  vessel atherosclerotic vascular disease. Aortic Atherosclerosis (ICD10-I70.0). 6. Mild thoracic kyphosis. Electronically Signed   By: Gaylyn Rong M.D.   On: 01/20/2024 16:54   DG Chest Port 1 View Result Date: 01/20/2024 CLINICAL DATA:  Bilateral pneumothoraces seen on a neck CT with contrast earlier today. Non-small-cell lung cancer. EXAM: PORTABLE CHEST 1 VIEW COMPARISON:  Neck CT with contrast obtained earlier today. Portable chest dated 12/26/2023. FINDINGS: Normal sized heart. Approximately 30% right apical, lateral and basilar pneumothorax. This is partially loculated. Less than 5% left apical pneumothorax. Multiple bilateral lung masses and right hilar mass with progression since 12/26/2023. Right upper lobe atelectasis/postobstructive changes unchanged from the neck CT earlier today. Mild right basilar linear atelectasis. No mediastinal shift. Minimal bilateral glenohumeral degenerative changes. Right upper lobe zone subsegmental atelectasis, postobstructive changes and mild right basilar linear atelectasis. IMPRESSION: 1. Approximately 30% right pneumothorax, partially loculated. 2. Less than 5% left apical pneumothorax. 3. Multiple bilateral lung masses and right hilar mass with progression since 12/26/2023. 4. Right upper lobe zone subsegmental atelectasis/postobstructive changes and mild right basilar linear atelectasis. Electronically Signed   By: Beckie Salts M.D.   On: 01/20/2024 15:18   CT Soft Tissue Neck W Contrast Addendum Date: 01/20/2024 ADDENDUM REPORT: 01/20/2024 14:41 ADDENDUM: These results were called by telephone at the time of interpretation on 01/20/2024 at 2:21 pm to provider Jonny Ruiz, PA, who verbally acknowledged these results. Electronically Signed   By: Emily Filbert M.D.   On: 01/20/2024 14:41   Result Date: 01/20/2024 CLINICAL DATA:  Burning sensation in back of throat, spit out blood this morning. Sore throat for a week. EXAM: CT NECK WITH CONTRAST TECHNIQUE:  Multidetector CT imaging of the neck was performed using the standard protocol following the bolus administration of intravenous contrast. RADIATION DOSE REDUCTION: This exam was performed according to the departmental dose-optimization program which includes automated exposure control, adjustment of the mA and/or kV according to patient size and/or use of iterative reconstruction technique. CONTRAST:  OMNIPAQUE IOHEXOL 300 MG/ML  SOLN COMPARISON:  CTA head and neck 09/23/2023. FINDINGS: Suprahyoid neck: Interval increase in size of heterogeneous mass involving the region of the left palatine tonsil measuring 2.6 x 2.6 x 3.2 cm, previously measuring 1.6 x 2.1 x 2.3 cm. The mass demonstrates prominent nodular areas of enhancement slightly increased since the prior study. The mass extends along the left lateral aspect of the oropharynx and partially involves the left posterior aspect of the soft palate. Mass also extends inferiorly to involve/abut the left base of tongue. There is resulting mild narrowing and rightward displacement of the or pharyngeal airway which is increased from prior. The nasopharynx is symmetric. The floor of mouth is unremarkable. Normal appearance of the epiglottis. Retropharynx is unremarkable. Infrahyoid neck: No mass or swelling. Aryepiglottic folds and pyriform sinuses are symmetric. Vocal folds are symmetric. Salivary glands: The parotid and submandibular glands are symmetric. No inflammation, mass, or calcification. Lymph nodes: Interval development of an enlarged heterogeneous left level 2A cervical node measuring 2.0 x 1.3 x 2.4 cm. The no demonstrates nodular areas of enhancement and additional areas of central hypoattenuation which could reflect necrosis similar in appearance to the left tonsillar mass. No enlarged right cervical lymph nodes. Thyroid: 1.6 cm hypoattenuating nodule along the lateral aspect of the right thyroid lobe. Vascular: Moderate atherosclerosis of the  thoracic aorta. Additional mild  atherosclerosis at the right carotid bifurcation. Limited intracranial: Atherosclerosis of the intracranial ICAs. Visualized intracranial structures otherwise unremarkable. Visualized orbits: Orbits are symmetric. Mastoids and visualized paranasal sinuses: Visualized paranasal sinuses are clear. Mastoid air cells are clear. Skeleton: No acute or aggressive lesion. Upper chest: Redemonstrated dominant mass in the right upper lobe involving the pleura which is overall similar to 12/26/2023. Similar appearance of right hilar mass. Additional scattered masses in the visualized lungs are similar to prior. There is interval development of pneumothorax bilaterally more pronounced on the right. Left chest wall Port-A-Cath partially visualized. IMPRESSION: Interval increase in size of heterogeneous mass involving the left palatine tonsil now measuring up to 3.2 cm, previously 2.3 cm. Mass abuts and possibly involves the soft palate and left aspect of the base of tongue. Mild narrowing and rightward deviation of the oropharyngeal airway is increased from prior. Interval development of enlarged enhancing left level 2A cervical node with similar characteristics to the left tonsillar mass. Similar appearance of bilateral pulmonary masses and right hilar mass compared to CT on 12/26/2023. Interval development of bilateral pneumothoraces, right greater than left. 1.6 cm right thyroid nodule. Recommend correlation with nonemergent thyroid ultrasound. Electronically Signed: By: Emily Filbert M.D. On: 01/20/2024 14:05   ECHOCARDIOGRAM LIMITED Result Date: 01/09/2024    ECHOCARDIOGRAM LIMITED REPORT   Patient Name:   SHIRONDA KAIN Date of Exam: 01/09/2024 Medical Rec #:  [id number]    Height:       65.0 in Accession #:    [id number]   Weight:       129.0 lb Date of Birth:  June 26, 1961     BSA:          1.642 m Patient Age:    63 years     BP:           103/71 mmHg Patient Gender: F            HR:            115 bpm. Exam Location:  Outpatient Procedure: Limited Echo and Strain Analysis (Both Spectral and Color Flow            Doppler were utilized during procedure). Indications:    I42.80 Non-ischemic cardiomyopathy, Chemo Evaluation  History:        Patient has prior history of Echocardiogram examinations, most                 recent 12/29/2023. Risk Factors:Hypertension and Dyslipidemia.  Sonographer:    Irving Burton Senior RDCS Referring Phys: [id number] ANGELA NICOLE DUKE  Sonographer Comments: Technically difficult study, patient sitting upright due to significant dyspnea IMPRESSIONS  1. Limited Study.  2. The left ventricle demonstrates global hypokinesis with regional wall motion wall motion involving inferoseptal, inferior, and anteroseptal wall (mostly base to mid segments). . Left ventricular ejection fraction, by estimation, is 35 to 40%. The left ventricle has moderately decreased function.  3. Right ventricular systolic function grossly normal. The right ventricular size is grossly normal.  4. No obvious findings to suggest tamponade physiology as IVC is not dialated and can be compressed. Accurary is limited due to no M-modes across the right atrium and right ventricle, no repiratory variation data across the MV and TV. If clinically suspicion is high consider repeat limited echo in 24-48hrs (clinical correlation is required). A small pericardial effusion is present. The pericardial effusion is anterior to the right ventricle and surrounding the apex. The pericardial effusion/space appears to contain mixed echogenic material (clinical correlation is  required) see image 4.  5. The mitral valve is grossly normal.  6. The aortic valve is grossly normal. Aortic valve sclerosis is present.  7. The inferior vena cava is normal in size with greater than 50% respiratory variability, suggesting right atrial pressure of 3 mmHg. FINDINGS  Left Ventricle: The left ventricle demonstrates global hypokinesis with regional wall  motion wall motion involving inferoseptal, inferior, and anteroseptal wall (mostly base to mid segments). Left ventricular ejection fraction, by estimation, is 35 to 40%. The left ventricle has moderately decreased function. 3D ejection fraction reviewed and evaluated as part of the interpretation. Alternate measurement of EF is felt to be most reflective of LV function. There is no left ventricular hypertrophy. Right Ventricle: The right ventricular size is grossly normal. Right ventricular systolic function grossly normal. Pericardium: No obvious findings to suggest tamponade physiology as IVC is not dialated and can be compressed. Accurary is limited due to no M-modes across the right atrium and right ventricle, no repiratory variation data across the MV and TV. If clinically suspicion is high consider repeat limited echo in 24-48hrs (clinical correlation is required). A small pericardial effusion is present. The pericardial effusion is anterior to the right ventricle and surrounding the apex. The pericardial effusion/space appears to contain mixed echogenic material (clinical correlation is required) see image 4.  Mitral Valve: The mitral valve is grossly normal. Mild mitral annular calcification. Aortic Valve: The aortic valve is grossly normal. Aortic valve sclerosis is present. Venous: The inferior vena cava is normal in size with greater than 50% respiratory variability, suggesting right atrial pressure of 3 mmHg. LEFT VENTRICLE PLAX 2D LVIDd:         3.50 cm LVIDs:         2.90 cm     2D Longitudinal Strain LV PW:         1.00 cm     2D Strain GLS (A4C):   -11.9 % LV IVS:        0.80 cm     2D Strain GLS (A3C):   -12.2 %                            2D Strain GLS (A2C):   -7.8 %                            2D Strain GLS Avg:     -10.6 % LV Volumes (MOD) LV vol d, MOD A2C: 59.2 ml LV vol d, MOD A4C: 70.6 ml LV vol s, MOD A2C: 37.2 ml LV vol s, MOD A4C: 46.8 ml LV SV MOD A2C:     22.0 ml LV SV MOD A4C:     70.6  ml LV SV MOD BP:      21.1 ml Sunit Tolia Electronically signed by Tessa Lerner Signature Date/Time: 01/09/2024/1:33:47 PM    Final    ECHOCARDIOGRAM LIMITED Result Date: 12/29/2023    ECHOCARDIOGRAM LIMITED REPORT   Patient Name:   KENDRA GRISSETT Date of Exam: 12/29/2023 Medical Rec #:  [id number]    Height:       65.0 in Accession #:    [id number]   Weight:       129.0 lb Date of Birth:  04-08-1961     BSA:          1.642 m Patient Age:    63 years     BP:  127/94 mmHg Patient Gender: F            HR:           124 bpm. Exam Location:  Inpatient Procedure: Limited Echo, Cardiac Doppler, Color Doppler and Intracardiac            Opacification Agent (Both Spectral and Color Flow Doppler were            utilized during procedure). REPORT CONTAINS CRITICAL RESULT Indications:    R00.0 Tachycardia  History:        Patient has prior history of Echocardiogram examinations, most                 recent 09/05/2023. Abnormal ECG, Arrythmias:Tachycardia,                 Signs/Symptoms:Dyspnea and Shortness of Breath; Risk                 Factors:Current Smoker and Dyslipidemia. Pulmonary embolus. Lung                 cancer. Chemo. ICH.  Sonographer:    Sheralyn Boatman RDCS Referring Phys: [phone] DANIEL P GOODRICH  Sonographer Comments: Technically difficult study due to poor echo windows. Port in parasternal. IMPRESSIONS  1. Left ventricular ejection fraction, by estimation, is 30 to 35%. The left ventricle has severely decreased function. The left ventricle demonstrates regional wall motion abnormalities with akinesis of the basal to mid anteroseptal, inferoseptal, and inferior walls. Akinesis of the basal anterior wall. The function of apical segments is relatively well-preserved. This picture of wall motion abnormalities would be unusual with coronary disease and could be consistent with a "reverse Takotsubo" stress cardiomyopathy.  2. Right ventricular systolic function is normal. The right ventricular size is normal.  3.  The inferior vena cava is normal in size with greater than 50% respiratory variability, suggesting right atrial pressure of 3 mmHg.  4. Limited echo. FINDINGS  Left Ventricle: Left ventricular ejection fraction, by estimation, is 30 to 35%. The left ventricle has moderately decreased function. The left ventricle demonstrates regional wall motion abnormalities. Definity contrast agent was given IV to delineate the left ventricular endocardial borders. The left ventricular internal cavity size was normal in size. There is no left ventricular hypertrophy. Right Ventricle: The right ventricular size is normal. No increase in right ventricular wall thickness. Right ventricular systolic function is normal. Pericardium: There is no evidence of pericardial effusion. Venous: The inferior vena cava is normal in size with greater than 50% respiratory variability, suggesting right atrial pressure of 3 mmHg. Additional Comments: Spectral Doppler performed. Color Doppler performed.  LEFT VENTRICLE PLAX 2D LVIDd:         4.60 cm LVIDs:         4.00 cm LV PW:         1.00 cm LV IVS:        1.10 cm  LV Volumes (MOD) LV vol d, MOD A2C: 70.8 ml LV vol d, MOD A4C: 107.0 ml LV vol s, MOD A2C: 61.4 ml LV vol s, MOD A4C: 62.4 ml LV SV MOD A2C:     9.4 ml LV SV MOD A4C:     107.0 ml LV SV MOD BP:      35.4 ml IVC IVC diam: 1.30 cm Dalton McleanMD Electronically signed by [name] Signature Date/Time: 12/29/2023/3:26:14 PM    Final    CT ABDOMEN PELVIS WO CONTRAST Result Date: 12/26/2023 CLINICAL DATA:  History of  non-small cell lung cancer, abdominal pain, anemia EXAM: CT ABDOMEN AND PELVIS WITHOUT CONTRAST TECHNIQUE: Multidetector CT imaging of the abdomen and pelvis was performed following the standard protocol without IV contrast. RADIATION DOSE REDUCTION: This exam was performed according to the departmental dose-optimization program which includes automated exposure control, adjustment of the mA and/or kV according to patient  size and/or use of iterative reconstruction technique. COMPARISON:  11/09/2023, 12/26/2023 FINDINGS: Lower chest: Multiple pulmonary nodules and right hilar mass are seen consistent with known metastatic lung cancer. Please see CT chest performed earlier today. Hepatobiliary: Numerous hypodense masses are seen throughout the liver, demonstrating decreased size and central necrosis since prior study. Index lesion in the left lobe liver measures 6.0 x 3.5 cm reference image 10/2, previously 8.3 x 4.5 cm. No evidence of cholelithiasis or cholecystitis. No evidence of biliary duct dilation. Pancreas: Unremarkable. No pancreatic ductal dilatation or surrounding inflammatory changes. Spleen: Normal in size without focal abnormality. The hypodense lesions seen on prior study, and visualized on earlier CT chest performed today, are not well visualized on this exam likely due to phase of contrast enhancement. Adrenals/Urinary Tract: Numerous indeterminate bilateral renal hypodensities are again noted. Index lesion in the interpolar right kidney reference image 17/2 measures 1.7 cm, previously measuring 1.8 cm. No hydronephrosis or nephrolithiasis. The adrenals are unremarkable. Excreted contrast within the urinary bladder with no filling defect identified. Stomach/Bowel: No bowel obstruction or ileus. Normal appendix right lower quadrant. Diverticulosis of the sigmoid colon without evidence of acute diverticulitis. Vascular/Lymphatic: Stable aortic atherosclerosis. There is persistent adenopathy at the porta hepatis, with decreased areas of necrosis and cavitation since prior study. Index lymph node on image 16/2 measures 2.4 cm in short axis, unchanged by my measurements since prior. No new adenopathy identified. Reproductive: Uterus and bilateral adnexa are unremarkable. Other: No free fluid or free intraperitoneal gas. No abdominal wall hernia. Musculoskeletal: No acute or destructive bony abnormalities. Reconstructed  images demonstrate no additional findings. IMPRESSION: 1. Right hilar mass and numerous bilateral pulmonary nodules consistent with known history of metastatic lung cancer. Please see chest CT performed earlier today. 2. Numerous hypodense masses within the liver, demonstrating decrease in size and resolution of central necrosis/cavitation on prior study. 3. Continued portacaval adenopathy, without significant change in size of lymph nodes. The central cavitation/necrosis has resolved. 4. Numerous bilateral renal hypodensities, too small to characterize, but grossly stable since prior exam. Metastatic disease remains the diagnosis of exclusion. 5. The splenic hypodensity seen on previous study and visualized on earlier chest CT today are not well visualized on this exam, likely due to delayed imaging relative to contrast administration performed earlier for chest CT. 6. Sigmoid diverticulosis without diverticulitis. 7. Aortic Atherosclerosis (ICD10-I70.0). Electronically Signed   By: Sharlet Salina M.D.   On: 12/26/2023 18:40   CT Angio Chest PE W and/or Wo Contrast Result Date: 12/26/2023 CLINICAL DATA:  Concern for pulmonary embolism. History of non-small cell lung cancer. EXAM: CT ANGIOGRAPHY CHEST WITH CONTRAST TECHNIQUE: Multidetector CT imaging of the chest was performed using the standard protocol during bolus administration of intravenous contrast. Multiplanar CT image reconstructions and MIPs were obtained to evaluate the vascular anatomy. RADIATION DOSE REDUCTION: This exam was performed according to the departmental dose-optimization program which includes automated exposure control, adjustment of the mA and/or kV according to patient size and/or use of iterative reconstruction technique. CONTRAST:  75mL OMNIPAQUE IOHEXOL 350 MG/ML SOLN COMPARISON:  CT dated 11/09/2023. FINDINGS: Cardiovascular: There is no cardiomegaly or pericardial effusion. Mild atherosclerotic  calcification of the thoracic aorta.  No aneurysmal dilatation or dissection. The origins of the great vessels of the aortic arch appear patent. Left-sided Port-A-Cath with tip at the cavoatrial junction. No pulmonary artery embolus identified. Mediastinum/Nodes: Right hilar mass encasing the right pulmonary artery and right upper and middle lobe bronchi relatively similar to prior CT. The esophagus is grossly unremarkable. No mediastinal fluid collection. Lungs/Pleura: Multiple bilateral pulmonary nodules similar or slightly decreased since the prior CT consistent with metastatic disease. No consolidative changes. Trace right pleural effusion. No pneumothorax. The central airways are patent. Upper Abdomen: Retroperitoneal adenopathy and left liver masses. Musculoskeletal: Osteopenia with degenerative changes. No acute osseous pathology. Review of the MIP images confirms the above findings. IMPRESSION: 1. No CT evidence of pulmonary artery embolus. 2. Right hilar mass and pulmonary metastasis similar or slightly decreased in size since the prior CT. 3. Retroperitoneal adenopathy and left liver masses. 4.  Aortic Atherosclerosis (ICD10-I70.0). Electronically Signed   By: Elgie Collard M.D.   On: 12/26/2023 14:18   DG Chest 1 View Result Date: 12/26/2023 CLINICAL DATA:  Chest pain, cough, and weakness. EXAM: CHEST  1 VIEW COMPARISON:  11/09/2023. FINDINGS: The heart size and mediastinal contours are within normal limits. There is atherosclerotic calcification of the aorta scattered opacities with right upper lobe and right hilar mass are noted in the lungs bilaterally, compatible with known pulmonary nodules and masses. No effusion or pneumothorax is seen. A left chest port is stable in position. No acute osseous abnormality. IMPRESSION: Scattered opacities in the lungs bilaterally with right upper lobe and right hilar masslike consolidation, increased from the prior exam. Electronically Signed   By: Thornell Sartorius M.D.   On: 12/26/2023 10:46

## 2024-01-25 DIAGNOSIS — R58 Hemorrhage, not elsewhere classified: Secondary | ICD-10-CM | POA: Diagnosis not present

## 2024-01-25 DIAGNOSIS — Z515 Encounter for palliative care: Secondary | ICD-10-CM | POA: Diagnosis not present

## 2024-01-25 DIAGNOSIS — C3490 Malignant neoplasm of unspecified part of unspecified bronchus or lung: Secondary | ICD-10-CM | POA: Diagnosis not present

## 2024-01-25 DIAGNOSIS — Z7189 Other specified counseling: Secondary | ICD-10-CM | POA: Diagnosis not present

## 2024-01-25 LAB — GLUCOSE, CAPILLARY
Glucose-Capillary: 109 mg/dL — ABNORMAL HIGH (ref 70–99)
Glucose-Capillary: 80 mg/dL (ref 70–99)
Glucose-Capillary: 81 mg/dL (ref 70–99)
Glucose-Capillary: 88 mg/dL (ref 70–99)

## 2024-01-25 LAB — CBC WITH DIFFERENTIAL/PLATELET
Abs Immature Granulocytes: 0.03 10*3/uL (ref 0.00–0.07)
Basophils Absolute: 0 10*3/uL (ref 0.0–0.1)
Basophils Relative: 0 %
Eosinophils Absolute: 0.7 10*3/uL — ABNORMAL HIGH (ref 0.0–0.5)
Eosinophils Relative: 11 %
HCT: 26.2 % — ABNORMAL LOW (ref 36.0–46.0)
Hemoglobin: 8.3 g/dL — ABNORMAL LOW (ref 12.0–15.0)
Immature Granulocytes: 1 %
Lymphocytes Relative: 10 %
Lymphs Abs: 0.6 10*3/uL — ABNORMAL LOW (ref 0.7–4.0)
MCH: 28.5 pg (ref 26.0–34.0)
MCHC: 31.7 g/dL (ref 30.0–36.0)
MCV: 90 fL (ref 80.0–100.0)
Monocytes Absolute: 0.6 10*3/uL (ref 0.1–1.0)
Monocytes Relative: 11 %
Neutro Abs: 4 10*3/uL (ref 1.7–7.7)
Neutrophils Relative %: 67 %
Platelets: 213 10*3/uL (ref 150–400)
RBC: 2.91 MIL/uL — ABNORMAL LOW (ref 3.87–5.11)
RDW: 18.5 % — ABNORMAL HIGH (ref 11.5–15.5)
WBC: 5.9 10*3/uL (ref 4.0–10.5)
nRBC: 0 % (ref 0.0–0.2)

## 2024-01-25 LAB — BASIC METABOLIC PANEL WITH GFR
Anion gap: 10 (ref 5–15)
BUN: 5 mg/dL — ABNORMAL LOW (ref 8–23)
CO2: 24 mmol/L (ref 22–32)
Calcium: 8.7 mg/dL — ABNORMAL LOW (ref 8.9–10.3)
Chloride: 100 mmol/L (ref 98–111)
Creatinine, Ser: 0.72 mg/dL (ref 0.44–1.00)
GFR, Estimated: >60 mL/min (ref >60)
Glucose, Bld: 85 mg/dL (ref 70–99)
Potassium: 3.2 mmol/L — ABNORMAL LOW (ref 3.5–5.1)
Sodium: 134 mmol/L — ABNORMAL LOW (ref 135–145)

## 2024-01-25 MED ORDER — POTASSIUM CHLORIDE CRYS ER 20 MEQ PO TBCR
40.0000 meq | EXTENDED_RELEASE_TABLET | Freq: Every day | ORAL | Status: DC
  Administered 2024-01-25 – 2024-01-26 (×2): 40 meq via ORAL
  Filled 2024-01-25 (×2): qty 2

## 2024-01-25 MED ORDER — METOPROLOL SUCCINATE ER 100 MG PO TB24
100.0000 mg | ORAL_TABLET | Freq: Every day | ORAL | Status: DC
  Administered 2024-01-26: 100 mg via ORAL
  Filled 2024-01-25: qty 1

## 2024-01-25 NOTE — Progress Notes (Signed)
 Patient ID: Rebecca Ochoa, female   DOB: 1961/10/30, 63 y.o.   MRN: [id number]    Progress Note from the Palliative Medicine Team at [facility]   Patient Name: Rebecca Ochoa        Date: 01/25/2024 DOB: 1961/06/03  Age: 63 y.o. MRN#: [id number] Attending Physician: Rhetta Mura, MD Primary Care Physician: Patient, No Pcp Per Admit Date: 01/20/2024   Reason for Consultation/Follow-up   Establishing Goals of Care   HPI/ Brief Hospital Review 63 y.o. 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  Her most recent cancer center/oncology visit was yesterday January 19, 2024 for management of multiple chemotherapy side effects.   She presented to the [facility] emergency room today with throat bleeding reported as signiicant - half coffee cup in morning. And int he ER when she drank water coughed blood again. .  ED workup notable for large left tonsillar tumor and necrotic lymph nodes on CT neck.  In addition patient found to have bilateral pneumothoraces R > L with R 30% but loculated. No distress. And Pulse 93% RA at rest - Pneumothorax is new compared to CT a month earlier with oncology   Noted to have high tumor burden that is progressing   Patient faces treatment option decisions, advanced directive decisions and anticipatory care needs.   Subjective  Extensive chart review has been completed prior to meeting with patient/family  including labs, vital signs, imaging, progress/consult notes, orders, medications and available advance directive documents.    This NP assessed patient at the bedside as a follow up for palliative medicine needs and emotional support.   Patient's significant other/Mike at bedside  Patient is expressing some pain - RN at bedside  administering medication; patient reports medication has been adequately managing pain.   We review her decision earlier today to involve hospice support at home. We discuss type of care provided and review next steps - discuss liaison will be following up with them to discuss this more. They understand.   We briefly reviewed need for further discuss around advanced care planning - patient shares she is hesitant to discuss this any further. Feels she has made enough hard decisions today. She is aware more discussions are need regarding code status but would like to hold off for now. We discuss these can be continued prior to discharge or post discharge. She understands.   Plan of care - Remains full code for now, does not want to discuss this now, may continue conversations with hospice team outpatient - has agreed to  home hospice care - patient/family will call PMT with any outstanding needs  Questions and concerns addressed   Discussed with primary team and nursing staff.  Consult placed to spiritual care to assist with advance care planning  Time: 30 minutes  Detailed review of medical records ( labs, imaging, vital signs), medically appropriate exam ( MS, skin, cardiac,  resp)   discussed with treatment team, counseling and education to patient, family, staff, documenting clinical information, medication management, coordination of care   Lekendrick Alpern Andi Devon, DNP, [facility] Palliative Medicine Team Team Phone # [phone]  Pager # [phone]

## 2024-01-25 NOTE — TOC Progression Note (Addendum)
 Transition of Care (TOC) - Progression Note   Was asked by MD to provide home hospice agency choice. Spoke to patient and Rebecca Ochoa , significant other at bedside. Provided Medicare.gov list of agencies. At discharge patient will be staying with Rebecca Ochoa  at [address], [place] , [place] [address]   They have NCM direct cell phone number and will call NCM back with decision. Team aware   Rebecca Ochoa called NCM and they have chosen [facility]. Referral given the Cheri with [facility] .   Patient Details  Name: Rebecca Ochoa MRN: [id number] Date of Birth: 05-Apr-1961  Transition [facility]) CM/SW Contact  [name], [name], RN Phone Number: 01/25/2024, 10:19 AM  Clinical Narrative:            Expected Discharge Plan and Services                                               Social Determinants of Health (SDOH) Interventions SDOH Screenings   Food Insecurity: No Food Insecurity (01/23/2024)  Housing: Low Risk  (01/23/2024)  Transportation Needs: No Transportation Needs (01/23/2024)  Utilities: Not At Risk (01/23/2024)  Depression (PHQ2-9): Low Risk  (06/04/2022)  Tobacco Use: Medium Risk (01/20/2024)    Readmission Risk Interventions    01/24/2024    3:28 PM 01/02/2024   12:44 PM 09/25/2023    2:00 PM  Readmission Risk Prevention Plan  Transportation Screening Complete Complete Complete  Medication Review (RN Care Manager) Complete Complete Referral to Pharmacy  PCP or Specialist appointment within 3-5 days of discharge Complete Complete Complete  HRI or Home Care Consult Complete  Complete  SW Recovery Care/Counseling Consult Complete Complete Complete  Palliative Care Screening  Not Applicable Not Applicable  Skilled Nursing Facility Not Applicable Not Applicable Not Applicable

## 2024-01-25 NOTE — Plan of Care (Signed)
  Problem: Education: Goal: Knowledge of General Education information will improve Description: Including pain rating scale, medication(s)/side effects and non-pharmacologic comfort measures 01/25/2024 1632 by Venia Minks, RN Outcome: Progressing 01/25/2024 1631 by Venia Minks, RN Outcome: Progressing   Problem: Health Behavior/Discharge Planning: Goal: Ability to manage health-related needs will improve 01/25/2024 1632 by Venia Minks, RN Outcome: Progressing 01/25/2024 1631 by Venia Minks, RN Outcome: Progressing   Problem: Clinical Measurements: Goal: Ability to maintain clinical measurements within normal limits will improve 01/25/2024 1632 by Venia Minks, RN Outcome: Progressing 01/25/2024 1631 by Venia Minks, RN Outcome: Progressing Goal: Will remain free from infection 01/25/2024 1632 by Venia Minks, RN Outcome: Progressing 01/25/2024 1631 by Venia Minks, RN Outcome: Progressing Goal: Diagnostic test results will improve 01/25/2024 1632 by Venia Minks, RN Outcome: Progressing 01/25/2024 1631 by Venia Minks, RN Outcome: Progressing Goal: Respiratory complications will improve 01/25/2024 1632 by Venia Minks, RN Outcome: Progressing 01/25/2024 1631 by Venia Minks, RN Outcome: Progressing Goal: Cardiovascular complication will be avoided 01/25/2024 1632 by Venia Minks, RN Outcome: Progressing 01/25/2024 1631 by Venia Minks, RN Outcome: Progressing   Problem: Nutrition: Goal: Adequate nutrition will be maintained 01/25/2024 1632 by Venia Minks, RN Outcome: Progressing 01/25/2024 1631 by Venia Minks, RN Outcome: Progressing   Problem: Coping: Goal: Level of anxiety will decrease 01/25/2024 1632 by Venia Minks, RN Outcome: Progressing 01/25/2024 1631 by Venia Minks, RN Outcome: Progressing   Problem: Elimination: Goal: Will not experience complications related to bowel motility 01/25/2024 1632 by Venia Minks, RN Outcome:  Progressing 01/25/2024 1631 by Venia Minks, RN Outcome: Progressing Goal: Will not experience complications related to urinary retention 01/25/2024 1632 by Venia Minks, RN Outcome: Progressing 01/25/2024 1631 by Venia Minks, RN Outcome: Progressing   Problem: Pain Managment: Goal: General experience of comfort will improve and/or be controlled 01/25/2024 1632 by Venia Minks, RN Outcome: Progressing 01/25/2024 1631 by Venia Minks, RN Outcome: Progressing   Problem: Safety: Goal: Ability to remain free from injury will improve 01/25/2024 1632 by Venia Minks, RN Outcome: Progressing 01/25/2024 1631 by Venia Minks, RN Outcome: Progressing   Problem: Skin Integrity: Goal: Risk for impaired skin integrity will decrease 01/25/2024 1632 by Venia Minks, RN Outcome: Progressing 01/25/2024 1631 by Venia Minks, RN Outcome: Progressing

## 2024-01-25 NOTE — Progress Notes (Signed)
 TRH ROUNDING NOTE Rebeccah Ivins ZOX:[id number]  DOB: 04-08-1961  DOA: 01/20/2024  PCP: Patient, No Pcp Per  01/25/2024,4:13 PM  LOS: 5 days    Code Status: Full code   from: Home current Dispo: Unclear   63 year old white female known anemia asthma reflux migraine breast reduction surgery tage IVb T3N2M1C 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 + large necrotic R mediastinal mass and various liver and bone mets as well as celiac plexus lymphadenopathy -? priorcarboplatin paclitaxel Keytruda 11/6- -- has received 14 fractions XRT per radiation oncology Chronic diarrhea secondary to Tagrisso which was stopped because of long QT Sinus tachycardia, chemo induced pancytopenia Questionable adrenal insufficiency on steroids Submassive pulmonary emboli 11/4 Recent hospitalization 2/25 through 01/02/24 with tachycardia cardiology consulted felt to be from stress cardiomyopathy ischemia not excluded  01/20/2024 throat bleeding half coffee cup vomited blood again-ED workup showed large left tonsillar tumor necrotic lymph nodes on CT neck and found to have bilateral pneumothorax R >L--- hemoglobin was 8.0 platelet 283 white count 8.7 sodium 135 potassium 3.2 BUNs/creatinine 13/0.7 she was given 2 units of PRBC this hospital stay CT neck showed interval increase heterogeneous mass involving the left palatine tonsil now up to 3.2 cm previously 2.3 mass abuts and possibly involve soft palate left aspect of base of tongue narrowing and rightward deviation of oropharyngeal airway is increased from prior, interval development of enlarging enhancing left level 2A cervical node with similar characteristics to the left tonsillar mass 1.6 cm right thyroid nodule Interval development of bilateral pneumothoraces right greater than sign left CXR 30% right pneumothorax partially loculated less than 5% apical pneumothorax CT chest confirmed 30% hydropneumothorax right upper lobe tethered to pleura by peripheral 3.6 cm  mass, 10% left pneumothorax with some dependent pleural density scattered nodules and masses in both lungs compatible with metastatic disease-dominant right hilar mass 4.3 cm in diameter-indistinct mass in left hepatic lobe compatible with metastatic disease  patient was given Andexxa stat ENT was noted they recommended transfer to [facility] for IR eval for embolization of arterial supply to the tonsil She was seen by them on 3/23 and it was decided to hold on embolization as she did not bleed 3/27 deciding on home hopsice but still not DNR   Plan  Tonsillar bleeding from poorly differentiated metastatic squamous cell CA No further bleeding- hemoglobin has stabilized in the 8 range after stabilized patient with Andexxa given earlier during hospital stay  Transfusion threshold is below 7   stage IVb poorly differentiated squamous cell CA RUL with mets  not a candidate for further therapy apparently Oncologist aware-has been on Tarceva 100 Tagroisso held 2/2 prolonged Qtc  Chronic sinus tachycardia Underlying system heart failure 01/09/2024 showed EF 35-40% with moderately decreased function-showed small pericardial effusion no tamponade--she has a stress test due and will follow-up with Dr. Wyline Mood Admitted for the same previously and seen in February for this Change to torpl xl 75 daily given orthostasis [previously was on 100 twice daily]--- holding losartan as was not taking at home  Mild hypokalemia Labs not repeated  Submassive pulmonary emboli previously Not a candidate for anticoagulation currently-monitor trends of hemoglobin  HTN Losartan 12.5 mg on hold given moderate hypotension  Diarrhea Probably secondary to nutritional shakes in addition to other meds Will resume Imodium  Depression/anxiety-resume Xanax 0.5 3 times daily as needed, temazepam 30 at bedtime as needed sleep   DVT prophylaxis: SCD at this time given high risk of bleed  Status is: Inpatient Remains  inpatient appropriate because:   Requires further management   Subjective:  No distress Long discussion about goals No fruther bleeding No cp Was a little dizzy earlier today.  Orthostatics were +--probably due to BB  Objective + exam Vitals:   01/25/24 1217 01/25/24 1420 01/25/24 1423 01/25/24 1426  BP: 107/65 110/72 96/67 (!) 83/53  Pulse: (!) 106     Resp: 17     Temp: 98.2 F (36.8 C)     TempSrc: Oral     SpO2: 96%     Weight:      Height:       Filed Weights   01/20/24 0959 01/24/24 0500 01/25/24 0500  Weight: 57.2 kg 57.2 kg 57.2 kg    Examination: EOMI NCAT no focal deficit no icterus no pallor no wheeze no rales no rhonchi CTAB no added sound S1-S2 tslight tachy Abd soft nt nd no rebound no guard No le edema Power 5/5  Data Reviewed: reviewed   CBC    Component Value Date/Time   WBC 5.9 01/25/2024 0346   RBC 2.91 (L) 01/25/2024 0346   HGB 8.3 (L) 01/25/2024 0346   HGB 9.2 (L) 01/19/2024 1420   HCT 26.2 (L) 01/25/2024 0346   PLT 213 01/25/2024 0346   PLT 373 01/19/2024 1420   MCV 90.0 01/25/2024 0346   MCH 28.5 01/25/2024 0346   MCHC 31.7 01/25/2024 0346   RDW 18.5 (H) 01/25/2024 0346   LYMPHSABS 0.6 (L) 01/25/2024 0346   MONOABS 0.6 01/25/2024 0346   EOSABS 0.7 (H) 01/25/2024 0346   BASOSABS 0.0 01/25/2024 0346      Latest Ref Rng & Units 01/25/2024    3:46 AM 01/24/2024    3:17 PM 01/23/2024    3:31 AM  CMP  Glucose 70 - 99 mg/dL 85  81  91   BUN 8 - 23 mg/dL 5  5  <5   Creatinine 4.40 - 1.00 mg/dL 1.02  7.25  3.66   Sodium 135 - 145 mmol/L 134  135  137   Potassium 3.5 - 5.1 mmol/L 3.2  3.6  3.3   Chloride 98 - 111 mmol/L 100  101  104   CO2 22 - 32 mmol/L 24  25  24    Calcium 8.9 - 10.3 mg/dL 8.7  8.9  8.9   Total Protein 6.5 - 8.1 g/dL  5.9  5.6   Total Bilirubin 0.0 - 1.2 mg/dL  1.1  1.2   Alkaline Phos 38 - 126 U/L  88  85   AST 15 - 41 U/L  40  40   ALT 0 - 44 U/L  17  17     Scheduled Meds:  Chlorhexidine Gluconate Cloth   6 each Topical Daily   enoxaparin (LOVENOX) injection  40 mg Subcutaneous Q24H   famotidine  20 mg Oral QHS   feeding supplement  237 mL Oral BID BM   metoprolol tartrate  75 mg Oral BID   potassium chloride  40 mEq Oral Daily   temazepam  30 mg Oral QHS   Continuous Infusions:  Time  60  Rhetta Mura, MD  [facility]

## 2024-01-25 NOTE — Progress Notes (Signed)
 Mobility Specialist Progress Note:   01/25/24 1341  Mobility  Activity Ambulated with assistance in hallway  Level of Assistance Standby assist, set-up cues, supervision of patient - no hands on  Assistive Device Front wheel walker  Distance Ambulated (ft) 55 ft  Activity Response Tolerated well  Mobility Referral Yes  Mobility visit 1 Mobility  Mobility Specialist Start Time (ACUTE ONLY) 1248  Mobility Specialist Stop Time (ACUTE ONLY) 1254  Mobility Specialist Time Calculation (min) (ACUTE ONLY) 6 min   Pt received in bed, agreeable to mobility. Requesting to use BR to urinate before ambulation. Void successful. Gait distance limited d/t fatigue and slight SOB. Desat to 82% on RA but quickly elevated to 90% with pursed lip breathing. Pt returned to bed with call bell in reach and all needs met.   Leory Plowman  Mobility Specialist Please contact via [employer] office at [phone]

## 2024-01-26 ENCOUNTER — Other Ambulatory Visit (HOSPITAL_COMMUNITY): Payer: Self-pay

## 2024-01-26 ENCOUNTER — Other Ambulatory Visit: Payer: Self-pay

## 2024-01-26 DIAGNOSIS — R58 Hemorrhage, not elsewhere classified: Secondary | ICD-10-CM | POA: Diagnosis not present

## 2024-01-26 LAB — CBC WITH DIFFERENTIAL/PLATELET
Abs Immature Granulocytes: 0.03 10*3/uL (ref 0.00–0.07)
Basophils Absolute: 0 10*3/uL (ref 0.0–0.1)
Basophils Relative: 0 %
Eosinophils Absolute: 0.6 10*3/uL — ABNORMAL HIGH (ref 0.0–0.5)
Eosinophils Relative: 11 %
HCT: 25.9 % — ABNORMAL LOW (ref 36.0–46.0)
Hemoglobin: 8.3 g/dL — ABNORMAL LOW (ref 12.0–15.0)
Immature Granulocytes: 1 %
Lymphocytes Relative: 10 %
Lymphs Abs: 0.6 10*3/uL — ABNORMAL LOW (ref 0.7–4.0)
MCH: 28.9 pg (ref 26.0–34.0)
MCHC: 32 g/dL (ref 30.0–36.0)
MCV: 90.2 fL (ref 80.0–100.0)
Monocytes Absolute: 0.6 10*3/uL (ref 0.1–1.0)
Monocytes Relative: 11 %
Neutro Abs: 3.5 10*3/uL (ref 1.7–7.7)
Neutrophils Relative %: 67 %
Platelets: 203 10*3/uL (ref 150–400)
RBC: 2.87 MIL/uL — ABNORMAL LOW (ref 3.87–5.11)
RDW: 18.3 % — ABNORMAL HIGH (ref 11.5–15.5)
WBC: 5.3 10*3/uL (ref 4.0–10.5)
nRBC: 0 % (ref 0.0–0.2)

## 2024-01-26 LAB — BASIC METABOLIC PANEL WITH GFR
Anion gap: 8 (ref 5–15)
BUN: 7 mg/dL — ABNORMAL LOW (ref 8–23)
CO2: 28 mmol/L (ref 22–32)
Calcium: 8.9 mg/dL (ref 8.9–10.3)
Chloride: 101 mmol/L (ref 98–111)
Creatinine, Ser: 0.67 mg/dL (ref 0.44–1.00)
GFR, Estimated: >60 mL/min (ref >60)
Glucose, Bld: 93 mg/dL (ref 70–99)
Potassium: 3.3 mmol/L — ABNORMAL LOW (ref 3.5–5.1)
Sodium: 137 mmol/L (ref 135–145)

## 2024-01-26 LAB — GLUCOSE, CAPILLARY
Glucose-Capillary: 100 mg/dL — ABNORMAL HIGH (ref 70–99)
Glucose-Capillary: 93 mg/dL (ref 70–99)

## 2024-01-26 MED ORDER — HEPARIN SOD (PORK) LOCK FLUSH 100 UNIT/ML IV SOLN
500.0000 [IU] | INTRAVENOUS | Status: AC | PRN
  Administered 2024-01-26: 500 [IU]
  Filled 2024-01-26: qty 5

## 2024-01-26 MED ORDER — HYDROCODONE-ACETAMINOPHEN 5-325 MG PO TABS
1.0000 | ORAL_TABLET | ORAL | 0 refills | Status: DC | PRN
  Filled 2024-01-26: qty 18, 2d supply, fill #0
  Filled 2024-01-26: qty 21, 3d supply, fill #0

## 2024-01-26 NOTE — TOC Progression Note (Signed)
 Transition of Care (TOC) - Progression Note   Patient has been accepted for home with hospice care by [facility]  Patient Details  Name: Rebecca Ochoa MRN: [id number] Date of Birth: Jan 20, 1961  Transition [facility]) CM/SW Contact  Koston Hennes, [name], RN Phone Number: 01/26/2024, 11:27 AM  Clinical Narrative:            Expected Discharge Plan and Services         Expected Discharge Date: 01/26/24                                     Social Determinants of Health (SDOH) Interventions SDOH Screenings   Food Insecurity: No Food Insecurity (01/23/2024)  Housing: Low Risk  (01/23/2024)  Transportation Needs: No Transportation Needs (01/23/2024)  Utilities: Not At Risk (01/23/2024)  Depression (PHQ2-9): Low Risk  (06/04/2022)  Tobacco Use: Medium Risk (01/20/2024)    Readmission Risk Interventions    01/24/2024    3:28 PM 01/02/2024   12:44 PM 09/25/2023    2:00 PM  Readmission Risk Prevention Plan  Transportation Screening Complete Complete Complete  Medication Review (RN Care Manager) Complete Complete Referral to Pharmacy  PCP or Specialist appointment within 3-5 days of discharge Complete Complete Complete  HRI or Home Care Consult Complete  Complete  SW Recovery Care/Counseling Consult Complete Complete Complete  Palliative Care Screening  Not Applicable Not Applicable  Skilled Nursing Facility Not Applicable Not Applicable Not Applicable

## 2024-01-26 NOTE — Discharge Summary (Signed)
 Physician Discharge Summary  Rebecca Ochoa DGU:[id number] DOB: 09/02/1961 DOA: 01/20/2024  PCP: Patient, No Pcp Per  Admit date: 01/20/2024 Discharge date: 01/26/2024  Time spent: 44 minutes  Recommendations for Outpatient Follow-up:  Recommend continue discussion with hospice/Dr. Shirline Frees regarding goals of care and advocated for DNR in the outpatient setting Minimized meds  Discharge Diagnoses:  MAIN problem for hospitalization   Tonsillar bleeding from poorly differentiated squamous cell CA Pneumothorax  Please see below for itemized issues addressed in HOpsital- refer to other progress notes for clarity if needed  Discharge Condition: Guarded  Diet recommendation: Liberalized to comfort meds and only essential meds  Filed Weights   01/20/24 0959 01/24/24 0500 01/25/24 0500  Weight: 57.2 kg 57.2 kg 57.2 kg    History of present illness:  63 year old white female known anemia asthma reflux migraine breast reduction surgery tage IVb [id number] 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 + large necrotic R mediastinal mass and various liver and bone mets as well as celiac plexus lymphadenopathy -? priorcarboplatin paclitaxel Keytruda 11/6- -- has received 14 fractions XRT per radiation oncology Chronic diarrhea secondary to Tagrisso which was stopped because of long QT Sinus tachycardia, chemo induced pancytopenia Questionable adrenal insufficiency on steroids Submassive pulmonary emboli 11/4 Recent hospitalization 2/25 through 01/02/24 with tachycardia cardiology consulted felt to be from stress cardiomyopathy ischemia not excluded   01/20/2024 throat bleeding half coffee cup vomited blood again-ED workup showed large left tonsillar tumor necrotic lymph nodes on CT neck and found to have bilateral pneumothorax R >L--- hemoglobin was 8.0 platelet 283 white count 8.7 sodium 135 potassium 3.2 BUNs/creatinine 13/0.7 she was given 2 units of PRBC this hospital stay CT neck showed  interval increase heterogeneous mass involving the left palatine tonsil now up to 3.2 cm previously 2.3 mass abuts and possibly involve soft palate left aspect of base of tongue narrowing and rightward deviation of oropharyngeal airway is increased from prior, interval development of enlarging enhancing left level 2A cervical node with similar characteristics to the left tonsillar mass 1.6 cm right thyroid nodule Interval development of bilateral pneumothoraces right greater than sign left CXR 30% right pneumothorax partially loculated less than 5% apical pneumothorax CT chest confirmed 30% hydropneumothorax right upper lobe tethered to pleura by peripheral 3.6 cm mass, 10% left pneumothorax with some dependent pleural density scattered nodules and masses in both lungs compatible with metastatic disease-dominant right hilar mass 4.3 cm in diameter-indistinct mass in left hepatic lobe compatible with metastatic disease  patient was given Andexxa stat ENT was noted they recommended transfer to [facility] for IR eval for embolization of arterial supply to the tonsil She was seen by them on 3/23 and it was decided to hold on embolization as she did not bleed 3/27 deciding on home hopsice but still not DNR     Plan   Tonsillar bleeding from poorly differentiated metastatic squamous cell CA Acute blood loss anemia present during admission No further bleeding-patient was transferred from [facility] to [facility] for embolization of tonsillar arteries but because bleeding stopped this was held and patient was managed conservatively Hemoglobin has stabilized in the 8 range after stabilized patient with Andexxa given earlier during hospital stay  Transfusion threshold is below 7 and we would not transfuse unless aggressively bleeding or having other issues  Pneumothorax 30% right partially loculated Seen by pulmonary--poor candidate to see per them for chest tube She feels winded with moving  around Hospice will consider oxygen in the outpatient setting and  seems to be ordering the same at discharge   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 Shirline Frees and I have reached out to him to set up an outpatient appointment to discuss things Warning signs for patient given and instructed to come back if large amount of bleeding severe nausea vomiting chest pain or lower extremity edema   Chronic sinus tachycardia Underlying sys heart failure 01/09/2024 showed EF 35-40% with moderately decreased function-showed small pericardial effusion no tamponade--in February for the same thing she was admitted-would probably cancel her outpatient stress test Change to torpl xl 75 daily given orthostasis [previously was on 100 twice daily]--- holding losartan as was not taking at home   Mild hypokalemia Would not chase labs going forward and minimize workup   Submassive pulmonary emboli previously Not a candidate for anticoagulation currently-monitor trends of hemoglobin We did have a detailed discussion about candidacy for blood thinner-I have explained to her that the risk of bleeding is high and that any anticoagulation would be risky She is at high risk for recurrent DVT or pulmonary embolism given her cancer state She understands that the risks of anticoagulation with life-threatening bleed outweigh benefit and have discussed this with her significant other as well   HTN Losartan 12.5 mg on hold given moderate hypotension and discontinued   Diarrhea Probably secondary to nutritional shakes in addition to  other meds Will resume Imodium   Depression/anxiety-resume Xanax 0.5 3 times daily as needed, temazepam 30 at bedtime as needed  Discharge Exam: Vitals:   01/26/24 0638 01/26/24 0832  BP: 116/67 117/70  Pulse: (!) 131 (!) 134  Resp:  17  Temp: 98.8 F (37.1 C) 98.4 F (36.9 C)  SpO2: 97% 95%    Subj on day of d/c   Doing fair winded on moving around but seems comfortable Chest is clear no wheeze  Discharge Instructions   Discharge Instructions     Diet - low sodium heart healthy   Complete by: As directed    Discharge instructions   Complete by: As directed    Follow-up with Dr. Shirline Frees in the outpatient setting-I have asked that they set you up with an appointment in the near future Hospice will follow you at home I have minimized your medications to only the absolute necessary meds-I would not recommend that you take any blood thinner you do not need blood pressure medications and we would not worry too much about your Jardiance and other meds For high-grade fever, severe chest pain, severe lower extremity swelling, and bleeding please come back to the emergency room Think carefully about hospice and DO NOT RESUSCITATE orders and hospice will follow-up with you   Increase activity slowly   Complete by: As directed    No wound care   Complete by: As directed       Allergies as of 01/26/2024       Reactions   Biaxin [clarithromycin] Nausea Only        Medication List     STOP taking these medications    apixaban 5 MG Tabs tablet Commonly known as: ELIQUIS   cyanocobalamin 500 MCG tablet Commonly  known as: VITAMIN B12   dexamethasone 4 MG tablet Commonly known as: DECADRON   empagliflozin 10 MG Tabs tablet Commonly known as: JARDIANCE   erlotinib 100 MG tablet Commonly known as: TARCEVA   folic acid 1 MG tablet Commonly known as: FOLVITE   Hycodan 5-1.5 MG/5ML syrup Generic drug: HYDROcodone bit-homatropine   losartan 25 MG tablet Commonly  known as: COZAAR   nystatin powder Commonly known as: MYCOSTATIN/NYSTOP   potassium chloride SA 20 MEQ tablet Commonly known as: KLOR-CON M   prochlorperazine 10 MG tablet Commonly known as: COMPAZINE       TAKE these medications    acetaminophen 500 MG tablet Commonly known as: TYLENOL Take 1,000 mg by mouth every 8 (eight) hours as needed for mild pain.   ALPRAZolam 0.5 MG tablet Commonly known as: XANAX Take 1 tablet (0.5 mg total) by mouth 3 (three) times daily as needed for anxiety. What changed:  when to take this additional instructions   famotidine 20 MG tablet Commonly known as: PEPCID Take 1 tablet (20 mg total) by mouth at bedtime.   GAS-X PO Take 2 tablets by mouth daily as needed (for gas).   HYDROcodone-acetaminophen 5-325 MG tablet Commonly known as: NORCO/VICODIN Take 1-2 tablets by mouth every 3 (three) hours as needed. What changed:  how much to take when to take this   loperamide 2 MG capsule Commonly known as: IMODIUM Take 1 capsule (2 mg total) by mouth as needed for diarrhea or loose stools.   metoprolol succinate 100 MG 24 hr tablet Commonly known as: Toprol XL Take 1 tablet (100 mg total) by mouth in the morning and at bedtime. Take with or immediately following a meal.   temazepam 30 MG capsule Commonly known as: RESTORIL Take 1 capsule (30 mg total) by mouth at bedtime as needed for sleep. What changed: when to take this       Allergies  Allergen Reactions   Biaxin [Clarithromycin] Nausea Only    Follow-up Information     [facility] Follow up.   Contact information: [address] [facility] [place] [id number] [phone]                 The results of significant diagnostics from this hospitalization (including imaging, microbiology, ancillary and laboratory) are listed below for reference.    Significant Diagnostic Studies: DG Chest Port 1 View Result Date: 01/21/2024 CLINICAL  DATA:  63 year old female with metastatic non-small cell lung cancer and right hydropneumothorax. EXAM: PORTABLE CHEST 1 VIEW COMPARISON:  Portable chest yesterday and earlier. FINDINGS: Portable AP upright view at 0542 hours. Moderate sized right hydropneumothorax persists with adhesion of right lung or lung tumor to the right upper and lateral pleura as before. Stable lung volumes and ventilation. Stable cardiac size and mediastinal contours. Stable left chest power port. Underlying rounded pulmonary metastases. Trace left hydropneumothorax no longer apparent. Negative visible bowel gas.  Stable visualized osseous structures. IMPRESSION: 1. Moderate right hydropneumothorax superimposed on right lung tumor and metastases stable from yesterday. 2. Smaller left hydropneumothorax no longer radiographically apparent. 3. No new cardiopulmonary abnormality. Electronically Signed   By: Odessa Fleming M.D.   On: 01/21/2024 08:52   DG Chest Port 1 View Result Date: 01/20/2024 CLINICAL DATA:  Follow-up pneumothorax EXAM: PORTABLE CHEST 1 VIEW COMPARISON:  01/20/2024, CT 01/20/2024, chest x-ray 03/19/2024 FINDINGS: Left-sided central venous port tip at the cavoatrial junction. No significant change in small left apical pneumothorax, demonstrating less than 1  cm pleural-parenchymal separation. Similar moderate size right pneumothorax without shift. Pulmonary nodules and masses corresponding to history of malignancy and metastatic disease. Stable cardiac size. IMPRESSION: 1. No significant change in small left apical pneumothorax and moderate size right pneumothorax. 2. Pulmonary nodules and masses corresponding to history of malignancy and metastatic disease. Electronically Signed   By: Jasmine Pang M.D.   On: 01/20/2024 21:27   CT CHEST WO CONTRAST Result Date: 01/20/2024 CLINICAL DATA:  Non-small cell lung cancer, bilateral pneumothoraces. * Tracking Code: BO * EXAM: CT CHEST WITHOUT CONTRAST TECHNIQUE: Multidetector CT  imaging of the chest was performed following the standard protocol without IV contrast. RADIATION DOSE REDUCTION: This exam was performed according to the departmental dose-optimization program which includes automated exposure control, adjustment of the mA and/or kV according to patient size and/or use of iterative reconstruction technique. COMPARISON:  Neck CT 01/20/2024 and CTA chest 12/26/2023 FINDINGS: Cardiovascular: Left Port-A-Cath tip: Cavoatrial junction. Coronary, aortic arch, and branch vessel atherosclerotic vascular disease. Mediastinum/Nodes: Aside from the right hilar mass there is little in the way of thoracic adenopathy. Lungs/Pleura: 30% right pneumothorax. Some of the right upper lobe may be tethered to the pleura by a peripheral 3.6 cm mass on image 28 series 4. Small amount of complex pleural fluid making this a hydropneumothorax. 10% left pneumothorax noted likewise with some dependent pleural density potentially from trace pleural fluid and with some loculated pneumothorax posteriorly along the posterior costophrenic angle. Scattered nodules and masses in both lungs. Dominant right hilar mass about 4.3 cm in diameter. Upper Abdomen: Abdominal aortic atherosclerosis. Indistinct masses in the left hepatic lobe. Pathologic peripancreatic node 1.5 cm in short axis, image 50 series 2. Musculoskeletal: Mild thoracic kyphosis. IMPRESSION: 1. 30% right hydropneumothorax. Some of the right upper lobe may be tethered to the pleura by a peripheral 3.6 cm mass. 2. 10% left pneumothorax with some dependent pleural density potentially from trace pleural fluid and with some loculated pneumothorax posteriorly along the posterior costophrenic angle. 3. Scattered nodules and masses in both lungs, compatible with metastatic disease. Dominant right hilar mass about 4.3 cm in diameter. 4. Indistinct masses in the left hepatic lobe, compatible with metastatic disease. Stable pathologic peripancreatic adenopathy.  5. Coronary, aortic arch, and branch vessel atherosclerotic vascular disease. Aortic Atherosclerosis (ICD10-I70.0). 6. Mild thoracic kyphosis. Electronically Signed   By: Gaylyn Rong M.D.   On: 01/20/2024 16:54   DG Chest Port 1 View Result Date: 01/20/2024 CLINICAL DATA:  Bilateral pneumothoraces seen on a neck CT with contrast earlier today. Non-small-cell lung cancer. EXAM: PORTABLE CHEST 1 VIEW COMPARISON:  Neck CT with contrast obtained earlier today. Portable chest dated 12/26/2023. FINDINGS: Normal sized heart. Approximately 30% right apical, lateral and basilar pneumothorax. This is partially loculated. Less than 5% left apical pneumothorax. Multiple bilateral lung masses and right hilar mass with progression since 12/26/2023. Right upper lobe atelectasis/postobstructive changes unchanged from the neck CT earlier today. Mild right basilar linear atelectasis. No mediastinal shift. Minimal bilateral glenohumeral degenerative changes. Right upper lobe zone subsegmental atelectasis, postobstructive changes and mild right basilar linear atelectasis. IMPRESSION: 1. Approximately 30% right pneumothorax, partially loculated. 2. Less than 5% left apical pneumothorax. 3. Multiple bilateral lung masses and right hilar mass with progression since 12/26/2023. 4. Right upper lobe zone subsegmental atelectasis/postobstructive changes and mild right basilar linear atelectasis. Electronically Signed   By: Beckie Salts M.D.   On: 01/20/2024 15:18   CT Soft Tissue Neck W Contrast Addendum Date: 01/20/2024 ADDENDUM REPORT: 01/20/2024 14:41  ADDENDUM: These results were called by telephone at the time of interpretation on 01/20/2024 at 2:21 pm to provider Jonny Ruiz, [place], who verbally acknowledged these results. Electronically Signed   By: Emily Filbert M.D.   On: 01/20/2024 14:41   Result Date: 01/20/2024 CLINICAL DATA:  Burning sensation in back of throat, spit out blood this morning. Sore throat for a week. EXAM: CT  NECK WITH CONTRAST TECHNIQUE: Multidetector CT imaging of the neck was performed using the standard protocol following the bolus administration of intravenous contrast. RADIATION DOSE REDUCTION: This exam was performed according to the departmental dose-optimization program which includes automated exposure control, adjustment of the mA and/or kV according to patient size and/or use of iterative reconstruction technique. CONTRAST:  OMNIPAQUE IOHEXOL 300 MG/ML  SOLN COMPARISON:  CTA head and neck 09/23/2023. FINDINGS: Suprahyoid neck: Interval increase in size of heterogeneous mass involving the region of the left palatine tonsil measuring 2.6 x 2.6 x 3.2 cm, previously measuring 1.6 x 2.1 x 2.3 cm. The mass demonstrates prominent nodular areas of enhancement slightly increased since the prior study. The mass extends along the left lateral aspect of the oropharynx and partially involves the left posterior aspect of the soft palate. Mass also extends inferiorly to involve/abut the left base of tongue. There is resulting mild narrowing and rightward displacement of the or pharyngeal airway which is increased from prior. The nasopharynx is symmetric. The floor of mouth is unremarkable. Normal appearance of the epiglottis. Retropharynx is unremarkable. Infrahyoid neck: No mass or swelling. Aryepiglottic folds and pyriform sinuses are symmetric. Vocal folds are symmetric. Salivary glands: The parotid and submandibular glands are symmetric. No inflammation, mass, or calcification. Lymph nodes: Interval development of an enlarged heterogeneous left level 2A cervical node measuring 2.0 x 1.3 x 2.4 cm. The no demonstrates nodular areas of enhancement and additional areas of central hypoattenuation which could reflect necrosis similar in appearance to the left tonsillar mass. No enlarged right cervical lymph nodes. Thyroid: 1.6 cm hypoattenuating nodule along the lateral aspect of the right thyroid lobe. Vascular:  Moderate atherosclerosis of the thoracic aorta. Additional mild atherosclerosis at the right carotid bifurcation. Limited intracranial: Atherosclerosis of the intracranial ICAs. Visualized intracranial structures otherwise unremarkable. Visualized orbits: Orbits are symmetric. Mastoids and visualized paranasal sinuses: Visualized paranasal sinuses are clear. Mastoid air cells are clear. Skeleton: No acute or aggressive lesion. Upper chest: Redemonstrated dominant mass in the right upper lobe involving the pleura which is overall similar to 12/26/2023. Similar appearance of right hilar mass. Additional scattered masses in the visualized lungs are similar to prior. There is interval development of pneumothorax bilaterally more pronounced on the right. Left chest wall Port-A-Cath partially visualized. IMPRESSION: Interval increase in size of heterogeneous mass involving the left palatine tonsil now measuring up to 3.2 cm, previously 2.3 cm. Mass abuts and possibly involves the soft palate and left aspect of the base of tongue. Mild narrowing and rightward deviation of the oropharyngeal airway is increased from prior. Interval development of enlarged enhancing left level 2A cervical node with similar characteristics to the left tonsillar mass. Similar appearance of bilateral pulmonary masses and right hilar mass compared to CT on 12/26/2023. Interval development of bilateral pneumothoraces, right greater than left. 1.6 cm right thyroid nodule. Recommend correlation with nonemergent thyroid ultrasound. Electronically Signed: By: Emily Filbert M.D. On: 01/20/2024 14:05   ECHOCARDIOGRAM LIMITED Result Date: 01/09/2024    ECHOCARDIOGRAM LIMITED REPORT   Patient Name:   EVANNY ELLERBE Date of Exam: 01/09/2024 Medical  Rec #:  [id number]    Height:       65.0 in Accession #:    [id number]   Weight:       129.0 lb Date of Birth:  04/19/1961     BSA:          1.642 m Patient Age:    63 years     BP:           103/71 mmHg Patient  Gender: F            HR:           115 bpm. Exam Location:  Outpatient Procedure: Limited Echo and Strain Analysis (Both Spectral and Color Flow            Doppler were utilized during procedure). Indications:    I42.80 Non-ischemic cardiomyopathy, Chemo Evaluation  History:        Patient has prior history of Echocardiogram examinations, most                 recent 12/29/2023. Risk Factors:Hypertension and Dyslipidemia.  Sonographer:    Irving Burton Senior RDCS Referring Phys: [id number] ANGELA NICOLE DUKE  Sonographer Comments: Technically difficult study, patient sitting upright due to significant dyspnea IMPRESSIONS  1. Limited Study.  2. The left ventricle demonstrates global hypokinesis with regional wall motion wall motion involving inferoseptal, inferior, and anteroseptal wall (mostly base to mid segments). . Left ventricular ejection fraction, by estimation, is 35 to 40%. The left ventricle has moderately decreased function.  3. Right ventricular systolic function grossly normal. The right ventricular size is grossly normal.  4. No obvious findings to suggest tamponade physiology as IVC is not dialated and can be compressed. Accurary is limited due to no M-modes across the right atrium and right ventricle, no repiratory variation data across the MV and TV. If clinically suspicion is high consider repeat limited echo in 24-48hrs (clinical correlation is required). A small pericardial effusion is present. The pericardial effusion is anterior to the right ventricle and surrounding the apex. The pericardial effusion/space appears to contain mixed echogenic material (clinical correlation is required) see image 4.  5. The mitral valve is grossly normal.  6. The aortic valve is grossly normal. Aortic valve sclerosis is present.  7. The inferior vena cava is normal in size with greater than 50% respiratory variability, suggesting right atrial pressure of 3 mmHg. FINDINGS  Left Ventricle: The left ventricle demonstrates global  hypokinesis with regional wall motion wall motion involving inferoseptal, inferior, and anteroseptal wall (mostly base to mid segments). Left ventricular ejection fraction, by estimation, is 35 to 40%. The left ventricle has moderately decreased function. 3D ejection fraction reviewed and evaluated as part of the interpretation. Alternate measurement of EF is felt to be most reflective of LV function. There is no left ventricular hypertrophy. Right Ventricle: The right ventricular size is grossly normal. Right ventricular systolic function grossly normal. Pericardium: No obvious findings to suggest tamponade physiology as IVC is not dialated and can be compressed. Accurary is limited due to no M-modes across the right atrium and right ventricle, no repiratory variation data across the MV and TV. If clinically suspicion is high consider repeat limited echo in 24-48hrs (clinical correlation is required). A small pericardial effusion is present. The pericardial effusion is anterior to the right ventricle and surrounding the apex. The pericardial effusion/space appears to contain mixed echogenic material (clinical correlation is required) see image 4.  Mitral Valve: The mitral valve is grossly normal. Mild  mitral annular calcification. Aortic Valve: The aortic valve is grossly normal. Aortic valve sclerosis is present. Venous: The inferior vena cava is normal in size with greater than 50% respiratory variability, suggesting right atrial pressure of 3 mmHg. LEFT VENTRICLE PLAX 2D LVIDd:         3.50 cm LVIDs:         2.90 cm     2D Longitudinal Strain LV PW:         1.00 cm     2D Strain GLS (A4C):   -11.9 % LV IVS:        0.80 cm     2D Strain GLS (A3C):   -12.2 %                            2D Strain GLS (A2C):   -7.8 %                            2D Strain GLS Avg:     -10.6 % LV Volumes (MOD) LV vol d, MOD A2C: 59.2 ml LV vol d, MOD A4C: 70.6 ml LV vol s, MOD A2C: 37.2 ml LV vol s, MOD A4C: 46.8 ml LV SV MOD A2C:      22.0 ml LV SV MOD A4C:     70.6 ml LV SV MOD BP:      21.1 ml Sunit Tolia Electronically signed by Tessa Lerner Signature Date/Time: 01/09/2024/1:33:47 PM    Final    ECHOCARDIOGRAM LIMITED Result Date: 12/29/2023    ECHOCARDIOGRAM LIMITED REPORT   Patient Name:   VESSIE OLMSTED Date of Exam: 12/29/2023 Medical Rec #:  [id number]    Height:       65.0 in Accession #:    [id number]   Weight:       129.0 lb Date of Birth:  02-18-1961     BSA:          1.642 m Patient Age:    63 years     BP:           127/94 mmHg Patient Gender: F            HR:           124 bpm. Exam Location:  Inpatient Procedure: Limited Echo, Cardiac Doppler, Color Doppler and Intracardiac            Opacification Agent (Both Spectral and Color Flow Doppler were            utilized during procedure). REPORT CONTAINS CRITICAL RESULT Indications:    R00.0 Tachycardia  History:        Patient has prior history of Echocardiogram examinations, most                 recent 09/05/2023. Abnormal ECG, Arrythmias:Tachycardia,                 Signs/Symptoms:Dyspnea and Shortness of Breath; Risk                 Factors:Current Smoker and Dyslipidemia. Pulmonary embolus. Lung                 cancer. Chemo. ICH.  Sonographer:    Sheralyn Boatman RDCS Referring Phys: [phone] DANIEL P GOODRICH  Sonographer Comments: Technically difficult study due to poor echo windows. Port in parasternal. IMPRESSIONS  1. Left ventricular ejection fraction, by estimation, is 30 to 35%.  The left ventricle has severely decreased function. The left ventricle demonstrates regional wall motion abnormalities with akinesis of the basal to mid anteroseptal, inferoseptal, and inferior walls. Akinesis of the basal anterior wall. The function of apical segments is relatively well-preserved. This picture of wall motion abnormalities would be unusual with coronary disease and could be consistent with a "reverse Takotsubo" stress cardiomyopathy.  2. Right ventricular systolic function is normal. The right  ventricular size is normal.  3. The inferior vena cava is normal in size with greater than 50% respiratory variability, suggesting right atrial pressure of 3 mmHg.  4. Limited echo. FINDINGS  Left Ventricle: Left ventricular ejection fraction, by estimation, is 30 to 35%. The left ventricle has moderately decreased function. The left ventricle demonstrates regional wall motion abnormalities. Definity contrast agent was given IV to delineate the left ventricular endocardial borders. The left ventricular internal cavity size was normal in size. There is no left ventricular hypertrophy. Right Ventricle: The right ventricular size is normal. No increase in right ventricular wall thickness. Right ventricular systolic function is normal. Pericardium: There is no evidence of pericardial effusion. Venous: The inferior vena cava is normal in size with greater than 50% respiratory variability, suggesting right atrial pressure of 3 mmHg. Additional Comments: Spectral Doppler performed. Color Doppler performed.  LEFT VENTRICLE PLAX 2D LVIDd:         4.60 cm LVIDs:         4.00 cm LV PW:         1.00 cm LV IVS:        1.10 cm  LV Volumes (MOD) LV vol d, MOD A2C: 70.8 ml LV vol d, MOD A4C: 107.0 ml LV vol s, MOD A2C: 61.4 ml LV vol s, MOD A4C: 62.4 ml LV SV MOD A2C:     9.4 ml LV SV MOD A4C:     107.0 ml LV SV MOD BP:      35.4 ml IVC IVC diam: 1.30 cm Dalton McleanMD Electronically signed by [name] Signature Date/Time: 12/29/2023/3:26:14 PM    Final     Microbiology: Recent Results (from the past 240 hours)  MRSA Next Gen by PCR, Nasal     Status: None   Collection Time: 01/20/24  8:36 PM   Specimen: Nasal Mucosa; Nasal Swab  Result Value Ref Range Status   MRSA by PCR Next Gen NOT DETECTED NOT DETECTED Final    Comment: (NOTE) The GeneXpert MRSA Assay (FDA approved for NASAL specimens only), is one component of a comprehensive MRSA colonization surveillance program. It is not intended to diagnose MRSA  infection nor to guide or monitor treatment for MRSA infections. Test performance is not FDA approved in patients less than 7 years old. Performed at [facility], [address], [place], [place] [address]      Labs: Basic Metabolic Panel: Recent Labs  Lab 01/19/24 1420 01/20/24 1110 01/21/24 0245 01/22/24 0345 01/22/24 1425 01/23/24 0331 01/24/24 1517 01/25/24 0346 01/26/24 0431  NA 136   < > 135   < > 135 137 135 134* 137  K 3.4*   < > 3.0*   < > 3.5 3.3* 3.6 3.2* 3.3*  CL 98   < > 102   < > 102 104 101 100 101  CO2 27   < > 23   < > 23 24 25 24 28   GLUCOSE 96   < > 93   < > 94 91 81 85 93  BUN  14   < > 11   < > 5* <5* 5* 5* 7*  CREATININE 0.79   < > 0.85   < > 0.65 0.71 0.74 0.72 0.67  CALCIUM 9.6   < > 8.6*   < > 9.0 8.9 8.9 8.7* 8.9  MG 1.5*  --  3.1*  --   --   --   --   --   --   PHOS  --   --  3.5  --   --   --   --   --   --    < > = values in this interval not displayed.   Liver Function Tests: Recent Labs  Lab 01/19/24 1420 01/22/24 0345 01/23/24 0331 01/24/24 1517  AST 60* 42* 40 40  ALT 28 19 17 17   ALKPHOS 133* 88 85 88  BILITOT 1.1 1.1 1.2 1.1  PROT 7.7 5.8* 5.6* 5.9*  ALBUMIN 3.2* 2.1* 2.0* 2.0*   No results for input(s): "LIPASE", "AMYLASE" in the last 168 hours. No results for input(s): "AMMONIA" in the last 168 hours. CBC: Recent Labs  Lab 01/19/24 1420 01/20/24 1110 01/21/24 0245 01/22/24 0345 01/22/24 1039 01/23/24 0331 01/24/24 1517 01/25/24 0346 01/26/24 0431  WBC 10.6* 8.7   < > 7.4  --  6.2 6.2 5.9 5.3  NEUTROABS 8.2* 6.8  --   --   --   --  4.4 4.0 3.5  HGB 9.2* 8.0*   < > 6.6* 8.4* 8.0* 8.2* 8.3* 8.3*  HCT 28.5* 26.0*   < > 21.0* 25.6* 24.4* 25.6* 26.2* 25.9*  MCV 84.8 88.7   < > 87.9  --  87.5 88.9 90.0 90.2  PLT 373 283   < > 235  --  208 222 213 203   < > = values in this interval not displayed.   Cardiac Enzymes: No results for input(s): "CKTOTAL", "CKMB", "CKMBINDEX", "TROPONINI" in the last 168  hours. BNP: BNP (last 3 results) Recent Labs    09/12/23 1149 10/17/23 0931 12/26/23 0658  BNP 67.4 84.8 73.3    ProBNP (last 3 results) No results for input(s): "PROBNP" in the last 8760 hours.  CBG: Recent Labs  Lab 01/24/24 2359 01/25/24 0600 01/25/24 1214 01/25/24 1849 01/26/24 0043  GLUCAP 81 80 88 109* 93    Signed:  Rhetta Mura MD   [facility] 01/26/2024, 10:47 AM

## 2024-01-26 NOTE — Progress Notes (Signed)
 Mobility Specialist Progress Note:   01/26/24 1135  Mobility  Activity Ambulated with assistance in hallway  Level of Assistance Standby assist, set-up cues, supervision of patient - no hands on  Assistive Device Front wheel walker  Distance Ambulated (ft) 60 ft  Activity Response Tolerated well  Mobility Referral Yes  Mobility visit 1 Mobility  Mobility Specialist Start Time (ACUTE ONLY) 1050  Mobility Specialist Stop Time (ACUTE ONLY) 1102  Mobility Specialist Time Calculation (min) (ACUTE ONLY) 12 min   Pre Mobility: 135 HR  During Mobility: 172 HR ,  89%-94% SpO2 RA Post Mobility: 152 HR   Pt received in bed, agreeable to mobility. Pt stated that she feels better overall compared to yesterday. Gait distance limited d/t elevated HR. RN notified. See vitals above. Pt c/o slight chest pain during ambulation, otherwise asx throughout. Denied any dizziness or SOB. Pt returned to supine with call bell in reach and all needs met.   Leory Plowman  Mobility Specialist Please contact via [employer] office at [phone]

## 2024-01-26 NOTE — Telephone Encounter (Signed)
error 

## 2024-01-30 ENCOUNTER — Telehealth: Payer: Self-pay | Admitting: Medical Oncology

## 2024-01-30 NOTE — Telephone Encounter (Signed)
 Pt wants to keep appt for tomorrow but change it to a mychart video visit. Schedule message sent.

## 2024-01-31 ENCOUNTER — Encounter: Admitting: Dietician

## 2024-01-31 ENCOUNTER — Ambulatory Visit: Admitting: Physician Assistant

## 2024-01-31 ENCOUNTER — Ambulatory Visit

## 2024-01-31 ENCOUNTER — Other Ambulatory Visit

## 2024-01-31 ENCOUNTER — Inpatient Hospital Stay: Attending: Internal Medicine | Admitting: Internal Medicine

## 2024-01-31 ENCOUNTER — Encounter

## 2024-01-31 DIAGNOSIS — R21 Rash and other nonspecific skin eruption: Secondary | ICD-10-CM | POA: Insufficient documentation

## 2024-01-31 DIAGNOSIS — Z79899 Other long term (current) drug therapy: Secondary | ICD-10-CM | POA: Insufficient documentation

## 2024-01-31 DIAGNOSIS — C787 Secondary malignant neoplasm of liver and intrahepatic bile duct: Secondary | ICD-10-CM | POA: Insufficient documentation

## 2024-01-31 DIAGNOSIS — C3411 Malignant neoplasm of upper lobe, right bronchus or lung: Secondary | ICD-10-CM | POA: Insufficient documentation

## 2024-01-31 NOTE — Progress Notes (Signed)
 [facility] [phone]   [phone]  PROGRESS NOTE FOR TELEMEDICINE VISITS  Patient, No Pcp Per No address on file  I connected withNAME@ on 01/31/24 at  8:30 AM EDT by video enabled telemedicine visit and verified that I am speaking with the correct person using two identifiers.   I discussed the limitations, risks, security and privacy concerns of performing an evaluation and management service by telemedicine and the availability of in-person appointments. I also discussed with the patient that there may be a patient responsible charge related to this service. The patient expressed understanding and agreed to proceed.  Other persons participating in the visit and their role in the encounter:  significant other, Michsel  Patient's location:  Home Provider's location: [facility]  DIAGNOSIS: Stage IVB ( T3, N2, M1c) non-Small Cell Lung Cancer, poorly differentiated squamous cell carcinoma presented with large right upper lobe lung mass in addition to large necrotic right hilar/mediastinal mass and innumerable hypermetabolic pulmonary metastatic lesions in addition to liver and bone metastasis as well as abdominal celiac axis lymphadenopathy diagnosed in October 2024.    PDL1: 0%   Molecular Studies: Positive for EGFR G719A   PRIOR THERAPY: 1) Palliative radiotherapy to the large hilar and mediastinal mass under the care of Dr. Roselind Messier last dose on 09/04/23 2) Targeted treatment with Tagrisso 80 mg p.o. daily discontinued in February 2025 due to prolonged Qtc 3) She will start concurrent systemic chemotherapy with carboplatin for AUC of 5 and Alimta 500 Mg/M2 on November 14, 2023. Status post 1 cycle. Starting from cycle #2 dose of carboplatin was reduced to an AUC of 4 and Alimta 400 mg/m.  Due to rash, her premedications are being adjusted.     CURRENT THERAPY: 1) Tarceva dose reduced 100 mg p.o. daily for EGFR mutation.  First dose of  Tarceva on ~12/25/23. This was held during her hospitalization and will be resumed today on 01/11/24 2)She will start concurrent systemic chemotherapy with carboplatin for AUC of 5 and Alimta 500 Mg/M2 on November 14, 2023. Status post 1 cycle. Starting from cycle #2 dose of carboplatin was reduced to an AUC of 4 and Alimta 400 mg/m.  Due to rash, her premedications are being adjusted. This is currently on hold due to performance status  INTERVAL HISTORY: Rebecca Ochoa 63 y.o. female has a MyChart virtual video visit with me today for evaluation and discussion of her condition.  She was accompanied by her boyfriend [name].Discussed the use of AI scribe software for clinical note transcription with the patient, who gave verbal consent to proceed.  History of Present Illness   Rebecca Ochoa is a 63 year old female with stage 4 non-small cell lung cancer who presents for evaluation and discussion of her condition. She is accompanied by Rebecca Ochoa, her boyfriend.  She was diagnosed with stage 4 non-small cell lung cancer in October 2024, with a positive EGFR G719A mutation. Initially, she received palliative radiotherapy to a large hilar and mediastinal mass. Following radiotherapy, she was started on targeted therapy with Tagrisso 80 mg daily, which was discontinued in February 2025 due to QT prolongation. She then began systemic chemotherapy with carboplatin and Alimta, but treatment was halted after the first cycle due to intolerance. Currently, she is on Tarceva 100 mg daily, but frequent hospitalizations and cardiac issues have limited her ability to continue this medication consistently.  A large mass was identified in the left palatine tonsil during a recent neck scan, which is  protruding and at risk of rupture. This mass has caused hemoptysis, leading to the discontinuation of blood thinners to prevent further hemorrhage.  She feels weak and experiences aches and pains after her recent hospital discharge.  Her current symptoms include weakness and general discomfort. She is considering her treatment options, including the possibility of palliative care or hospice, and is currently receiving home hospice care.       MEDICAL HISTORY: Past Medical History:  Diagnosis Date   Anemia    Asthma, mild intermittent, well-controlled    GERD (gastroesophageal reflux disease)    Hx of migraines    Hyperlipidemia    Hypertension    Vitamin D deficiency     ALLERGIES:  is allergic to biaxin [clarithromycin].  MEDICATIONS:  Current Outpatient Medications  Medication Sig Dispense Refill   acetaminophen (TYLENOL) 500 MG tablet Take 1,000 mg by mouth every 8 (eight) hours as needed for mild pain.     ALPRAZolam (XANAX) 0.5 MG tablet Take 1 tablet (0.5 mg total) by mouth 3 (three) times daily as needed for anxiety. (Patient taking differently: Take 0.5 mg by mouth See admin instructions. Take 0.5 mg by mouth in the morning and an additional 0.5 mg once a day as needed for anxiety) 60 tablet 0   famotidine (PEPCID) 20 MG tablet Take 1 tablet (20 mg total) by mouth at bedtime. 30 tablet 2   HYDROcodone-acetaminophen (NORCO/VICODIN) 5-325 MG tablet Take 1-2 tablets by mouth every 3 (three) hours as needed. 21 tablet 0   loperamide (IMODIUM) 2 MG capsule Take 1 capsule (2 mg total) by mouth as needed for diarrhea or loose stools. 30 capsule 0   metoprolol succinate (TOPROL XL) 100 MG 24 hr tablet Take 1 tablet (100 mg total) by mouth in the morning and at bedtime. Take with or immediately following a meal. 180 tablet 0   Simethicone (GAS-X PO) Take 2 tablets by mouth daily as needed (for gas).     temazepam (RESTORIL) 30 MG capsule Take 1 capsule (30 mg total) by mouth at bedtime as needed for sleep. (Patient taking differently: Take 30 mg by mouth at bedtime.) 30 capsule 3   No current facility-administered medications for this visit.   Facility-Administered Medications Ordered in Other Visits  Medication  Dose Route Frequency Provider Last Rate Last Admin   0.9 %  sodium chloride infusion (Manually program via Guardrails IV Fluids)  250 mL Intravenous Continuous Heilingoetter, Cassandra L, PA-C   Stopped at 12/13/23 1658   0.9 %  sodium chloride infusion   Intravenous Continuous Si Gaul, MD   Stopped at 12/13/23 1526    SURGICAL HISTORY:  Past Surgical History:  Procedure Laterality Date   BREAST SURGERY     reduction   BRONCHIAL BIOPSY  08/08/2023   Procedure: BRONCHIAL BIOPSIES;  Surgeon: Leslye Peer, MD;  Location: [facility] ENDOSCOPY;  Service: Pulmonary;;   BRONCHIAL BRUSHINGS  08/08/2023   Procedure: BRONCHIAL BRUSHINGS;  Surgeon: Leslye Peer, MD;  Location: [facility] ENDOSCOPY;  Service: Pulmonary;;   BRONCHIAL NEEDLE ASPIRATION BIOPSY  08/08/2023   Procedure: BRONCHIAL NEEDLE ASPIRATION BIOPSIES;  Surgeon: Leslye Peer, MD;  Location: [facility] ENDOSCOPY;  Service: Pulmonary;;   ENDOBRONCHIAL ULTRASOUND Bilateral 08/08/2023   Procedure: ENDOBRONCHIAL ULTRASOUND;  Surgeon: Leslye Peer, MD;  Location: [facility] ENDOSCOPY;  Service: Pulmonary;  Laterality: Bilateral;   HEMOSTASIS CONTROL  08/08/2023   Procedure: HEMOSTASIS CONTROL;  Surgeon: Leslye Peer, MD;  Location: [facility] ENDOSCOPY;  Service: Pulmonary;;  IR IMAGING GUIDED PORT INSERTION  09/01/2023   REDUCTION MAMMAPLASTY     TUBAL LIGATION      REVIEW OF SYSTEMS:  Constitutional: positive for anorexia, fatigue, and weight loss Eyes: negative Ears, nose, mouth, throat, and face: positive for sore throat Respiratory: positive for cough, dyspnea on exertion, and hemoptysis Cardiovascular: negative Gastrointestinal: negative Genitourinary:negative Integument/breast: negative Hematologic/lymphatic: negative Musculoskeletal:positive for muscle weakness Neurological: negative Behavioral/Psych: negative Endocrine: negative Allergic/Immunologic: negative     LABORATORY DATA: Lab Results  Component Value Date   WBC 5.3 01/26/2024    HGB 8.3 (L) 01/26/2024   HCT 25.9 (L) 01/26/2024   MCV 90.2 01/26/2024   PLT 203 01/26/2024      Chemistry      Component Value Date/Time   NA 137 01/26/2024 0431   K 3.3 (L) 01/26/2024 0431   CL 101 01/26/2024 0431   CO2 28 01/26/2024 0431   BUN 7 (L) 01/26/2024 0431   CREATININE 0.67 01/26/2024 0431   CREATININE 0.79 01/19/2024 1420   CREATININE 1.05 10/12/2022 1442      Component Value Date/Time   CALCIUM 8.9 01/26/2024 0431   ALKPHOS 88 01/24/2024 1517   AST 40 01/24/2024 1517   AST 60 (H) 01/19/2024 1420   ALT 17 01/24/2024 1517   ALT 28 01/19/2024 1420   BILITOT 1.1 01/24/2024 1517   BILITOT 1.1 01/19/2024 1420       RADIOGRAPHIC STUDIES: DG Chest Port 1 View Result Date: 01/21/2024 CLINICAL DATA:  63 year old female with metastatic non-small cell lung cancer and right hydropneumothorax. EXAM: PORTABLE CHEST 1 VIEW COMPARISON:  Portable chest yesterday and earlier. FINDINGS: Portable AP upright view at 0542 hours. Moderate sized right hydropneumothorax persists with adhesion of right lung or lung tumor to the right upper and lateral pleura as before. Stable lung volumes and ventilation. Stable cardiac size and mediastinal contours. Stable left chest power port. Underlying rounded pulmonary metastases. Trace left hydropneumothorax no longer apparent. Negative visible bowel gas.  Stable visualized osseous structures. IMPRESSION: 1. Moderate right hydropneumothorax superimposed on right lung tumor and metastases stable from yesterday. 2. Smaller left hydropneumothorax no longer radiographically apparent. 3. No new cardiopulmonary abnormality. Electronically Signed   By: Odessa Fleming M.D.   On: 01/21/2024 08:52   DG Chest Port 1 View Result Date: 01/20/2024 CLINICAL DATA:  Follow-up pneumothorax EXAM: PORTABLE CHEST 1 VIEW COMPARISON:  01/20/2024, CT 01/20/2024, chest x-ray 03/19/2024 FINDINGS: Left-sided central venous port tip at the cavoatrial junction. No significant change in  small left apical pneumothorax, demonstrating less than 1 cm pleural-parenchymal separation. Similar moderate size right pneumothorax without shift. Pulmonary nodules and masses corresponding to history of malignancy and metastatic disease. Stable cardiac size. IMPRESSION: 1. No significant change in small left apical pneumothorax and moderate size right pneumothorax. 2. Pulmonary nodules and masses corresponding to history of malignancy and metastatic disease. Electronically Signed   By: Jasmine Pang M.D.   On: 01/20/2024 21:27   CT CHEST WO CONTRAST Result Date: 01/20/2024 CLINICAL DATA:  Non-small cell lung cancer, bilateral pneumothoraces. * Tracking Code: BO * EXAM: CT CHEST WITHOUT CONTRAST TECHNIQUE: Multidetector CT imaging of the chest was performed following the standard protocol without IV contrast. RADIATION DOSE REDUCTION: This exam was performed according to the departmental dose-optimization program which includes automated exposure control, adjustment of the mA and/or kV according to patient size and/or use of iterative reconstruction technique. COMPARISON:  Neck CT 01/20/2024 and CTA chest 12/26/2023 FINDINGS: Cardiovascular: Left Port-A-Cath tip: Cavoatrial junction. Coronary, aortic  arch, and branch vessel atherosclerotic vascular disease. Mediastinum/Nodes: Aside from the right hilar mass there is little in the way of thoracic adenopathy. Lungs/Pleura: 30% right pneumothorax. Some of the right upper lobe may be tethered to the pleura by a peripheral 3.6 cm mass on image 28 series 4. Small amount of complex pleural fluid making this a hydropneumothorax. 10% left pneumothorax noted likewise with some dependent pleural density potentially from trace pleural fluid and with some loculated pneumothorax posteriorly along the posterior costophrenic angle. Scattered nodules and masses in both lungs. Dominant right hilar mass about 4.3 cm in diameter. Upper Abdomen: Abdominal aortic atherosclerosis.  Indistinct masses in the left hepatic lobe. Pathologic peripancreatic node 1.5 cm in short axis, image 50 series 2. Musculoskeletal: Mild thoracic kyphosis. IMPRESSION: 1. 30% right hydropneumothorax. Some of the right upper lobe may be tethered to the pleura by a peripheral 3.6 cm mass. 2. 10% left pneumothorax with some dependent pleural density potentially from trace pleural fluid and with some loculated pneumothorax posteriorly along the posterior costophrenic angle. 3. Scattered nodules and masses in both lungs, compatible with metastatic disease. Dominant right hilar mass about 4.3 cm in diameter. 4. Indistinct masses in the left hepatic lobe, compatible with metastatic disease. Stable pathologic peripancreatic adenopathy. 5. Coronary, aortic arch, and branch vessel atherosclerotic vascular disease. Aortic Atherosclerosis (ICD10-I70.0). 6. Mild thoracic kyphosis. Electronically Signed   By: Gaylyn Rong M.D.   On: 01/20/2024 16:54   DG Chest Port 1 View Result Date: 01/20/2024 CLINICAL DATA:  Bilateral pneumothoraces seen on a neck CT with contrast earlier today. Non-small-cell lung cancer. EXAM: PORTABLE CHEST 1 VIEW COMPARISON:  Neck CT with contrast obtained earlier today. Portable chest dated 12/26/2023. FINDINGS: Normal sized heart. Approximately 30% right apical, lateral and basilar pneumothorax. This is partially loculated. Less than 5% left apical pneumothorax. Multiple bilateral lung masses and right hilar mass with progression since 12/26/2023. Right upper lobe atelectasis/postobstructive changes unchanged from the neck CT earlier today. Mild right basilar linear atelectasis. No mediastinal shift. Minimal bilateral glenohumeral degenerative changes. Right upper lobe zone subsegmental atelectasis, postobstructive changes and mild right basilar linear atelectasis. IMPRESSION: 1. Approximately 30% right pneumothorax, partially loculated. 2. Less than 5% left apical pneumothorax. 3. Multiple  bilateral lung masses and right hilar mass with progression since 12/26/2023. 4. Right upper lobe zone subsegmental atelectasis/postobstructive changes and mild right basilar linear atelectasis. Electronically Signed   By: Beckie Salts M.D.   On: 01/20/2024 15:18   CT Soft Tissue Neck W Contrast Addendum Date: 01/20/2024 ADDENDUM REPORT: 01/20/2024 14:41 ADDENDUM: These results were called by telephone at the time of interpretation on 01/20/2024 at 2:21 pm to provider Jonny Ruiz, PA, who verbally acknowledged these results. Electronically Signed   By: Emily Filbert M.D.   On: 01/20/2024 14:41   Result Date: 01/20/2024 CLINICAL DATA:  Burning sensation in back of throat, spit out blood this morning. Sore throat for a week. EXAM: CT NECK WITH CONTRAST TECHNIQUE: Multidetector CT imaging of the neck was performed using the standard protocol following the bolus administration of intravenous contrast. RADIATION DOSE REDUCTION: This exam was performed according to the departmental dose-optimization program which includes automated exposure control, adjustment of the mA and/or kV according to patient size and/or use of iterative reconstruction technique. CONTRAST:  OMNIPAQUE IOHEXOL 300 MG/ML  SOLN COMPARISON:  CTA head and neck 09/23/2023. FINDINGS: Suprahyoid neck: Interval increase in size of heterogeneous mass involving the region of the left palatine tonsil measuring 2.6 x 2.6 x 3.2  cm, previously measuring 1.6 x 2.1 x 2.3 cm. The mass demonstrates prominent nodular areas of enhancement slightly increased since the prior study. The mass extends along the left lateral aspect of the oropharynx and partially involves the left posterior aspect of the soft palate. Mass also extends inferiorly to involve/abut the left base of tongue. There is resulting mild narrowing and rightward displacement of the or pharyngeal airway which is increased from prior. The nasopharynx is symmetric. The floor of mouth is unremarkable.  Normal appearance of the epiglottis. Retropharynx is unremarkable. Infrahyoid neck: No mass or swelling. Aryepiglottic folds and pyriform sinuses are symmetric. Vocal folds are symmetric. Salivary glands: The parotid and submandibular glands are symmetric. No inflammation, mass, or calcification. Lymph nodes: Interval development of an enlarged heterogeneous left level 2A cervical node measuring 2.0 x 1.3 x 2.4 cm. The no demonstrates nodular areas of enhancement and additional areas of central hypoattenuation which could reflect necrosis similar in appearance to the left tonsillar mass. No enlarged right cervical lymph nodes. Thyroid: 1.6 cm hypoattenuating nodule along the lateral aspect of the right thyroid lobe. Vascular: Moderate atherosclerosis of the thoracic aorta. Additional mild atherosclerosis at the right carotid bifurcation. Limited intracranial: Atherosclerosis of the intracranial ICAs. Visualized intracranial structures otherwise unremarkable. Visualized orbits: Orbits are symmetric. Mastoids and visualized paranasal sinuses: Visualized paranasal sinuses are clear. Mastoid air cells are clear. Skeleton: No acute or aggressive lesion. Upper chest: Redemonstrated dominant mass in the right upper lobe involving the pleura which is overall similar to 12/26/2023. Similar appearance of right hilar mass. Additional scattered masses in the visualized lungs are similar to prior. There is interval development of pneumothorax bilaterally more pronounced on the right. Left chest wall Port-A-Cath partially visualized. IMPRESSION: Interval increase in size of heterogeneous mass involving the left palatine tonsil now measuring up to 3.2 cm, previously 2.3 cm. Mass abuts and possibly involves the soft palate and left aspect of the base of tongue. Mild narrowing and rightward deviation of the oropharyngeal airway is increased from prior. Interval development of enlarged enhancing left level 2A cervical node with  similar characteristics to the left tonsillar mass. Similar appearance of bilateral pulmonary masses and right hilar mass compared to CT on 12/26/2023. Interval development of bilateral pneumothoraces, right greater than left. 1.6 cm right thyroid nodule. Recommend correlation with nonemergent thyroid ultrasound. Electronically Signed: By: Emily Filbert M.D. On: 01/20/2024 14:05   ECHOCARDIOGRAM LIMITED Result Date: 01/09/2024    ECHOCARDIOGRAM LIMITED REPORT   Patient Name:   Rebecca Ochoa Date of Exam: 01/09/2024 Medical Rec #:  [id number]    Height:       65.0 in Accession #:    [id number]   Weight:       129.0 lb Date of Birth:  Jun 13, 1961     BSA:          1.642 m Patient Age:    63 years     BP:           103/71 mmHg Patient Gender: F            HR:           115 bpm. Exam Location:  Outpatient Procedure: Limited Echo and Strain Analysis (Both Spectral and Color Flow            Doppler were utilized during procedure). Indications:    I42.80 Non-ischemic cardiomyopathy, Chemo Evaluation  History:        Patient has prior history of Echocardiogram examinations, most  recent 12/29/2023. Risk Factors:Hypertension and Dyslipidemia.  Sonographer:    Irving Burton Senior RDCS Referring Phys: [id number] ANGELA NICOLE DUKE  Sonographer Comments: Technically difficult study, patient sitting upright due to significant dyspnea IMPRESSIONS  1. Limited Study.  2. The left ventricle demonstrates global hypokinesis with regional wall motion wall motion involving inferoseptal, inferior, and anteroseptal wall (mostly base to mid segments). . Left ventricular ejection fraction, by estimation, is 35 to 40%. The left ventricle has moderately decreased function.  3. Right ventricular systolic function grossly normal. The right ventricular size is grossly normal.  4. No obvious findings to suggest tamponade physiology as IVC is not dialated and can be compressed. Accurary is limited due to no M-modes across the right atrium and  right ventricle, no repiratory variation data across the MV and TV. If clinically suspicion is high consider repeat limited echo in 24-48hrs (clinical correlation is required). A small pericardial effusion is present. The pericardial effusion is anterior to the right ventricle and surrounding the apex. The pericardial effusion/space appears to contain mixed echogenic material (clinical correlation is required) see image 4.  5. The mitral valve is grossly normal.  6. The aortic valve is grossly normal. Aortic valve sclerosis is present.  7. The inferior vena cava is normal in size with greater than 50% respiratory variability, suggesting right atrial pressure of 3 mmHg. FINDINGS  Left Ventricle: The left ventricle demonstrates global hypokinesis with regional wall motion wall motion involving inferoseptal, inferior, and anteroseptal wall (mostly base to mid segments). Left ventricular ejection fraction, by estimation, is 35 to 40%. The left ventricle has moderately decreased function. 3D ejection fraction reviewed and evaluated as part of the interpretation. Alternate measurement of EF is felt to be most reflective of LV function. There is no left ventricular hypertrophy. Right Ventricle: The right ventricular size is grossly normal. Right ventricular systolic function grossly normal. Pericardium: No obvious findings to suggest tamponade physiology as IVC is not dialated and can be compressed. Accurary is limited due to no M-modes across the right atrium and right ventricle, no repiratory variation data across the MV and TV. If clinically suspicion is high consider repeat limited echo in 24-48hrs (clinical correlation is required). A small pericardial effusion is present. The pericardial effusion is anterior to the right ventricle and surrounding the apex. The pericardial effusion/space appears to contain mixed echogenic material (clinical correlation is required) see image 4.  Mitral Valve: The mitral valve is  grossly normal. Mild mitral annular calcification. Aortic Valve: The aortic valve is grossly normal. Aortic valve sclerosis is present. Venous: The inferior vena cava is normal in size with greater than 50% respiratory variability, suggesting right atrial pressure of 3 mmHg. LEFT VENTRICLE PLAX 2D LVIDd:         3.50 cm LVIDs:         2.90 cm     2D Longitudinal Strain LV PW:         1.00 cm     2D Strain GLS (A4C):   -11.9 % LV IVS:        0.80 cm     2D Strain GLS (A3C):   -12.2 %                            2D Strain GLS (A2C):   -7.8 %  2D Strain GLS Avg:     -10.6 % LV Volumes (MOD) LV vol d, MOD A2C: 59.2 ml LV vol d, MOD A4C: 70.6 ml LV vol s, MOD A2C: 37.2 ml LV vol s, MOD A4C: 46.8 ml LV SV MOD A2C:     22.0 ml LV SV MOD A4C:     70.6 ml LV SV MOD BP:      21.1 ml Sunit Tolia Electronically signed by [employer] Signature Date/Time: 01/09/2024/1:33:47 PM    Final     ASSESSMENT AND PLAN: This is a very pleasant 63 years old white female with Stage IVB ( T3, N2, M1c) non-Small Cell Lung Cancer, poorly differentiated squamous cell carcinoma presented with large right upper lobe lung mass in addition to large necrotic right hilar/mediastinal mass and innumerable hypermetabolic pulmonary metastatic lesions in addition to liver and bone metastasis as well as abdominal celiac axis lymphadenopathy diagnosed in October 2024.  Molecular studies showed negative PD-L1 expression and positive EGFR mutation G719A She underwent palliative radiotherapy to the large hilar and mediastinal mass under the care of Dr. Roselind Messier last dose on 09/04/23 She is currently on treatment with Tagrisso 80 mg p.o. daily started on Ochoa 29, 2024.  The patient has been tolerating this treatment fairly well except for the few episodes of diarrhea. She started concurrent chemotherapy with carboplatin for AUC of 5 and Alimta 500 Mg/M2 on November 21, 2023 with Tagrisso 80 mg p.o. daily.  This was discontinued  after 1 cycle of treatment secondary to intolerance and Tagrisso was also discontinued secondary to QT prolongation. The patient transition to treatment with Tarceva 100 mg p.o. daily but also has frequent admission to the hospital with weakness fatigue and most recently hemoptysis.     Stage 4 non-small cell lung cancer with EGFR G719A mutation Stage 4 non-small cell lung cancer with EGFR G719A mutation, diagnosed in October 2024. Previous treatment with Tagrisso was discontinued due to QT prolongation. Current treatment with Tarceva is complicated by frequent hospitalizations and cardiac issues. Chemotherapy with carboplatin and Alimta was intolerable after one cycle. Consideration of continuing Tarceva monotherapy and palliative radiotherapy to bleeding left tonsillar mass. Emphasized the risk of further complications due to her cardiac condition. Radiation therapy to the tonsillar mass may cause swallowing issues but could control bleeding. Transition to palliative care and hospice is a reasonable option given the complications and lack of curative potential. - Continue Tarceva 100 mg PO daily if tolerated. - Consider palliative radiotherapy to left tonsillar mass. - Discuss treatment options with her and significant other.  Bleeding left tonsillar mass Bleeding from a large mass in the left palatine tonsil area, at risk of rupture and bleeding, especially if on anticoagulants. Radiation therapy is a potential treatment option, but may cause swallowing issues. The mass is protruding and at risk of spontaneous rupture, necessitating careful management to prevent significant bleeding. - Consider palliative radiotherapy to left tonsillar mass.  Goals of Care Discussion about the appropriateness of continuing aggressive treatment versus transitioning to palliative care and hospice. Emphasis on quality of life over longevity due to multiple complications and frequent hospitalizations. She and her  significant other are to discuss and decide on the preferred approach. Acknowledged that despite treatment, there is no cure, and the focus should be on comfort and quality of life. Implementing a DNR order is advised to prevent unnecessary interventions that would not improve her condition. - Discuss and decide on goals of care with significant other. - Implement DNR  order in medical chart.  Follow-up Open appointment with hematology/oncology provider for re-evaluation if treatment plan changes or further consultation is needed. She is encouraged to reach out if there are changes in her condition or if she wishes to reconsider her treatment options. - Maintain open appointment with hematology/oncology provider for re-evaluation if needed.   The patient was advised to call immediately if she has any concerning symptoms in the interval. I discussed the assessment and treatment plan with the patient. The patient was provided an opportunity to ask questions and all were answered. The patient agreed with the plan and demonstrated an understanding of the instructions.   The patient was advised to call back or seek an in-person evaluation if the symptoms worsen or if the condition fails to improve as anticipated.  I provided 30 minutes of face-to-face video visit time during this encounter, and > 50% was spent counseling as documented under my assessment & plan.  Lajuana Matte, MD 01/31/2024 8:41 AM  Disclaimer: This note was dictated with voice recognition software. Similar sounding words can inadvertently be transcribed and may not be corrected upon review.

## 2024-02-01 ENCOUNTER — Other Ambulatory Visit: Payer: Self-pay | Admitting: Internal Medicine

## 2024-02-06 ENCOUNTER — Telehealth (HOSPITAL_COMMUNITY): Payer: Self-pay | Admitting: Internal Medicine

## 2024-02-06 NOTE — Telephone Encounter (Signed)
 I called to reschedule Myoview due to we now have Prior authorization in order to schedule. I spoke with patient and she does not wish to have th test. Order will be removed from the [facility]. Thank you.

## 2024-02-06 NOTE — Telephone Encounter (Signed)
 Patient identification verified by 2 forms. Marilynn Rail, RN    Called and spoke to patient  Patient states:   -does not wish to reschedule Myoview due to being on hospice home care   -has been on hospice home care as of a few weeks ago   -unsure if she needs to cancel future cardiology appoints   -will follow up call back office to cancel future appointments if needed  Patient has no further questions at this time

## 2024-02-07 NOTE — Progress Notes (Unsigned)
 Cardiology Office Note:    Date:  02/07/2024   ID:  Rebecca Ochoa, DOB 12-Sep-1961, MRN [id number]  PCP:  Patient, No Pcp Per   [facility] Providers Cardiologist:  None     Referring MD: No ref. provider found   No chief complaint on file.  PACs  History of Present Illness:    Rebecca Ochoa is a 63 y.o. female with a hx below, referral for sinus rhythm with PACs.  No ischemic  ST segment changes. Minimal PVCs. Some palpitations. No syncope.  No sudden cardiac death family hx/no known coronary dx. No caffeine. 2 glasses of wine. No CP/SOB. Vapes, no smoking.  Interim hx 2/7 She was recently diagnosed with stage IVb non-small cell lung cancer.  She has a prolonged QTc of 583; therefore osimertinib was held after 4 doses.  She also had sinus tachycardia.  Chest CTA was negative for PE on 11/09/2023.  She was seen in clinic by her oncologist on 12/06/2023 and she was significantly tachycardic up to the 160s just with ambulation.  It was suggested that she go to the emergency room however she chose to be seen by cardiology outpatient.  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   Past Medical History:  Diagnosis Date   Anemia    Asthma, mild intermittent, well-controlled    GERD (gastroesophageal reflux disease)    Hx of migraines    Hyperlipidemia    Hypertension    Vitamin D deficiency     Past Surgical History:  Procedure Laterality Date   BREAST SURGERY     reduction   BRONCHIAL BIOPSY  08/08/2023   Procedure: BRONCHIAL BIOPSIES;  Surgeon: Leslye Peer, MD;  Location: [facility] ENDOSCOPY;  Service: Pulmonary;;   BRONCHIAL BRUSHINGS  08/08/2023   Procedure: BRONCHIAL BRUSHINGS;  Surgeon: Leslye Peer, MD;  Location: [facility] ENDOSCOPY;  Service: Pulmonary;;   BRONCHIAL NEEDLE ASPIRATION BIOPSY  08/08/2023   Procedure: BRONCHIAL NEEDLE ASPIRATION BIOPSIES;  Surgeon: Leslye Peer, MD;  Location: [facility] ENDOSCOPY;  Service: Pulmonary;;   ENDOBRONCHIAL ULTRASOUND Bilateral 08/08/2023    Procedure: ENDOBRONCHIAL ULTRASOUND;  Surgeon: Leslye Peer, MD;  Location: [facility] ENDOSCOPY;  Service: Pulmonary;  Laterality: Bilateral;   HEMOSTASIS CONTROL  08/08/2023   Procedure: HEMOSTASIS CONTROL;  Surgeon: Leslye Peer, MD;  Location: [facility] ENDOSCOPY;  Service: Pulmonary;;   IR IMAGING GUIDED PORT INSERTION  09/01/2023   REDUCTION MAMMAPLASTY     TUBAL LIGATION      Current Medications: No outpatient medications have been marked as taking for the 02/09/24 encounter (Appointment) with [name], MD.     Allergies:   Biaxin [clarithromycin]   Social History   Socioeconomic History   Marital status: Domestic Partner    Spouse name: Not on file   Number of children: Not on file   Years of education: Not on file   Highest education level: Not on file  Occupational History   Not on file  Tobacco Use   Smoking status: Former    Types: E-cigarettes   Smokeless tobacco: Never   Tobacco comments:    Quit in October 2024.  11/09/2023 hfb  Vaping Use   Vaping status: Every Day  Substance and Sexual Activity   Alcohol use: Yes    Alcohol/week: 3.0 standard drinks of alcohol    Types: 3 Standard drinks or equivalent per week   Drug use: Never   Sexual activity: Not on file  Other Topics Concern   Not on  file  Social History Narrative   Not on file   Social Drivers of Health   Financial Resource Strain: Not on file  Food Insecurity: No Food Insecurity (01/23/2024)   Hunger Vital Sign    Worried About Running Out of Food in the Last Year: Never true    Ran Out of Food in the Last Year: Never true  Transportation Needs: No Transportation Needs (01/23/2024)   PRAPARE - Administrator, Civil Service (Medical): No    Lack of Transportation (Non-Medical): No  Physical Activity: Not on file  Stress: Not on file  Social Connections: Not on file     Family History: The patient's family history includes Cancer in her brother; Diabetes in her father; Heart  disease in her father; Hypertension in her father; Kidney disease in her father.  ROS:   Please see the history of present illness.     All other systems reviewed and are negative.  EKGs/Labs/Other Studies Reviewed:    The following studies were reviewed today:   EKG:  EKG is  ordered today.  The ekg ordered today demonstrates   10/14/2022- NSR with PACs with PVCs  Recent Labs: 12/26/2023: B Natriuretic Peptide 73.3; TSH 1.211 01/21/2024: Magnesium 3.1 01/24/2024: ALT 17 01/26/2024: BUN 7; Creatinine, Ser 0.67; Hemoglobin 8.3; Platelets 203; Potassium 3.3; Sodium 137  Recent Lipid Panel    Component Value Date/Time   CHOL 139 10/18/2023 1243   TRIG 118 09/24/2023 0500   HDL 31 (L) 09/24/2023 0500   CHOLHDL 3.9 09/24/2023 0500   VLDL 24 09/24/2023 0500   LDLCALC 67 09/24/2023 0500   LDLCALC 90 10/12/2022 1442     Risk Assessment/Calculations:         Physical Exam:    VS:  There were no vitals filed for this visit.   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  She has had persistent sinus tachycardia however would not suspect that this would lead to a tacky mediated cardiomyopathy. -Blood pressure has been low making it difficult to uptitrate GDMT -Continue metoprolol XL 100  mg daily  Cancer therapy induced prolonged Qtc I saw initially for minimal ectopy.  She was diagnosed with non-small cell lung cancer in early October and started osimertinib.  Her QTc is prolonged to greater than 500 ms.  This is associated with this cancer therapy agent. -tagrisso has been on hold since December '24; consider alternative cancer therapy if feasible   Sinus tachycardia Echo is normal. Prior CT in January showed no PE.  This is during her cancer therapy.  Dehydration can be playing a role. Also cancer itself can impact sympathetic activity.  She has also been anemic  this contributes as well. -Can give IV fluids with therapy -Can transfuse Hgb>7  History of  submassive PE Continue Eliquis Platelets are stable 103. PLAN:    In order of problems listed above:  Follow up in 3 months with APP: please transition to Theresia Bough       Medication Adjustments/Labs and Tests Ordered: Current medicines are reviewed at length with the patient today.  Concerns regarding medicines are outlined above.  No orders of the defined types were placed in this encounter.  No orders of the defined types were placed in this encounter.   There are no Patient Instructions on file for this visit.   Signed, [name], MD  02/07/2024 2:27 PM    [facility]

## 2024-02-09 ENCOUNTER — Ambulatory Visit: Attending: Internal Medicine | Admitting: Internal Medicine

## 2024-02-09 ENCOUNTER — Telehealth: Payer: Self-pay

## 2024-02-09 VITALS — Ht 65.0 in | Wt 126.0 lb

## 2024-02-09 DIAGNOSIS — Z79899 Other long term (current) drug therapy: Secondary | ICD-10-CM

## 2024-02-09 DIAGNOSIS — I427 Cardiomyopathy due to drug and external agent: Secondary | ICD-10-CM

## 2024-02-09 DIAGNOSIS — Z5181 Encounter for therapeutic drug level monitoring: Secondary | ICD-10-CM

## 2024-02-09 NOTE — Patient Instructions (Signed)
 Medication Instructions:  Your physician recommends that you continue on your current medications as directed. Please refer to the Current Medication list given to you today.  *If you need a refill on your cardiac medications before your next appointment, please call your pharmacy*  Follow-Up: At [facility], you and your health needs are our priority.  As part of our continuing mission to provide you with exceptional heart care, our providers are all part of one team.  This team includes your primary Cardiologist (physician) and Advanced Practice Providers or APPs (Physician Assistants and Nurse Practitioners) who all work together to provide you with the care you need, when you need it.  Your next appointment:   As needed  Provider:   Any APP  We recommend signing up for the patient portal called "MyChart".  Sign up information is provided on this After Visit Summary.  MyChart is used to connect with patients for Virtual Visits (Telemedicine).  Patients are able to view lab/test results, encounter notes, upcoming appointments, etc.  Non-urgent messages can be sent to your provider as well.   To learn more about what you can do with MyChart, go to [email or web address].   Other Instructions    1st Floor: - Lobby - Registration  - Pharmacy  - Lab - Cafe  2nd Floor: - PV Lab - Diagnostic Testing (echo, CT, nuclear med)  3rd Floor: - Vacant  4th Floor: - TCTS (cardiothoracic surgery) - AFib Clinic - Structural Heart Clinic - Vascular Surgery  - Vascular Ultrasound  5th Floor: - HeartCare Cardiology (general and EP) - Clinical Pharmacy for coumadin, hypertension, lipid, weight-loss medications, and med management appointments    Valet parking services will be available as well.

## 2024-02-09 NOTE — Telephone Encounter (Signed)
  Patient Consent for Virtual Visit        Rebecca Ochoa has provided verbal consent on 02/09/2024 for a virtual visit (video or telephone).   CONSENT FOR VIRTUAL VISIT FOR:  Rebecca Ochoa  By participating in this virtual visit I agree to the following:  I hereby voluntarily request, consent and authorize [facility] and its employed or contracted physicians, physician assistants, nurse practitioners or other licensed health care professionals (the Practitioner), to provide me with telemedicine health care services (the "Services") as deemed necessary by the treating Practitioner. I acknowledge and consent to receive the Services by the Practitioner via telemedicine. I understand that the telemedicine visit will involve communicating with the Practitioner through live audiovisual communication technology and the disclosure of certain medical information by electronic transmission. I acknowledge that I have been given the opportunity to request an in-person assessment or other available alternative prior to the telemedicine visit and am voluntarily participating in the telemedicine visit.  I understand that I have the right to withhold or withdraw my consent to the use of telemedicine in the course of my care at any time, without affecting my right to future care or treatment, and that the Practitioner or I may terminate the telemedicine visit at any time. I understand that I have the right to inspect all information obtained and/or recorded in the course of the telemedicine visit and may receive copies of available information for a reasonable fee.  I understand that some of the potential risks of receiving the Services via telemedicine include:  Delay or interruption in medical evaluation due to technological equipment failure or disruption; Information transmitted may not be sufficient (e.g. poor resolution of images) to allow for appropriate medical decision making by the Practitioner;  and/or  In rare instances, security protocols could fail, causing a breach of personal health information.  Furthermore, I acknowledge that it is my responsibility to provide information about my medical history, conditions and care that is complete and accurate to the best of my ability. I acknowledge that Practitioner's advice, recommendations, and/or decision may be based on factors not within their control, such as incomplete or inaccurate data provided by me or distortions of diagnostic images or specimens that may result from electronic transmissions. I understand that the practice of medicine is not an exact science and that Practitioner makes no warranties or guarantees regarding treatment outcomes. I acknowledge that a copy of this consent can be made available to me via my patient portal [facility] MyChart), or I can request a printed copy by calling the office of [facility].    I understand that my insurance will be billed for this visit.   I have read or had this consent read to me. I understand the contents of this consent, which adequately explains the benefits and risks of the Services being provided via telemedicine.  I have been provided ample opportunity to ask questions regarding this consent and the Services and have had my questions answered to my satisfaction. I give my informed consent for the services to be provided through the use of telemedicine in my medical care

## 2024-02-12 ENCOUNTER — Other Ambulatory Visit: Admitting: Licensed Clinical Social Worker

## 2024-03-04 ENCOUNTER — Encounter (HOSPITAL_BASED_OUTPATIENT_CLINIC_OR_DEPARTMENT_OTHER): Payer: Self-pay

## 2024-03-04 NOTE — Progress Notes (Deleted)
 Cardiology Clinic Note   Patient Name: Rebecca Ochoa Date of Encounter: 03/04/2024  Primary Care Provider:  Patient, No Pcp Per Primary Cardiologist:  None  Patient Profile    Rebecca Ochoa 63 year old female presents to the clinic today for follow-up evaluation of her systolic CHF.  Past Medical History    Past Medical History:  Diagnosis Date   Anemia    Asthma, mild intermittent, well-controlled    GERD (gastroesophageal reflux disease)    Hx of migraines    Hyperlipidemia    Hypertension    Vitamin D  deficiency    Past Surgical History:  Procedure Laterality Date   BREAST SURGERY     reduction   BRONCHIAL BIOPSY  08/08/2023   Procedure: BRONCHIAL BIOPSIES;  Surgeon: Denson Flake, MD;  Location: [facility] ENDOSCOPY;  Service: Pulmonary;;   BRONCHIAL BRUSHINGS  08/08/2023   Procedure: BRONCHIAL BRUSHINGS;  Surgeon: Denson Flake, MD;  Location: [facility] ENDOSCOPY;  Service: Pulmonary;;   BRONCHIAL NEEDLE ASPIRATION BIOPSY  08/08/2023   Procedure: BRONCHIAL NEEDLE ASPIRATION BIOPSIES;  Surgeon: Denson Flake, MD;  Location: [facility] ENDOSCOPY;  Service: Pulmonary;;   ENDOBRONCHIAL ULTRASOUND Bilateral 08/08/2023   Procedure: ENDOBRONCHIAL ULTRASOUND;  Surgeon: Denson Flake, MD;  Location: [facility] ENDOSCOPY;  Service: Pulmonary;  Laterality: Bilateral;   HEMOSTASIS CONTROL  08/08/2023   Procedure: HEMOSTASIS CONTROL;  Surgeon: Denson Flake, MD;  Location: [facility] ENDOSCOPY;  Service: Pulmonary;;   IR IMAGING GUIDED PORT INSERTION  09/01/2023   REDUCTION MAMMAPLASTY     TUBAL LIGATION      Allergies  Allergies  Allergen Reactions   Biaxin [Clarithromycin] Nausea Only    History of Present Illness    Rebecca Ochoa has a PMH of systolic CHF, sinus rhythm with PACs/PVCs, and palpitations.  She was diagnosed with stage IVb non-small cell lung cancer.  She was noted to have prolonged QTc measuring 583.  Herosimertinib was held after 4 doses.  She was also noted to have sinus tachycardia.   She had chest CTA which was negative for PE 11/09/2023.  She was seen by her oncologist on 12/06/2023.  During that time she was tachycardic in the 160s with ambulation.  It was recommended that she present to the emergency room however, she chose to be seen by cardiology.  She has also been pancytopenic due to her chemotherapy.  She was contacted by Dr. Amanda Jungling on 02/09/2024 via telephone.  She is in hospice.  She reported being comfortable.  A nuclear stress test was planned to determine ischemia.  However, she was noted to have hemoptysis and her nuclear stress test was canceled and medical management was felt to be the best course of treatment.  She did note some persistent elevated heart rate.  It was not felt that this would lead to tachycardia mediated cardiomyopathy.  Her blood pressure remained low.  This prevented up titration of GDMT.  Her metoprolol  was continued.  She has a history of submassive PE.  She did have a neck scan which showed left palatine tonsil and it was not recommended that she resume anticoagulation due to risk of rupture.  She presents to the clinic today for follow-up evaluation and states***.  *** denies chest pain, shortness of breath, lower extremity edema, fatigue, palpitations, melena, hematuria, hemoptysis, diaphoresis, weakness, presyncope, syncope, orthopnea, and PND.   Systolic CHF-denies increased dyspnea with increased physical activity.  Reports that she continues to be comfortable with ongoing hospice care.  Continues to note elevated heart  rate with increased physical activity.  Blood pressure today***. Maintain p.o. hydration Maintain blood pressure log Continue metoprolol  Repeat echocardiogram when clinically indicated  Sinus tachycardia-heart rate today***.  Previously felt that this may be related to dehydration and cancer. Continue increased hydration Continue with supportive care for anemia  History of PE-not a candidate for anticoagulation due to left  palatine tonsil protrusion.  Breathing stable. Continue palliative care  Non-small cell lung cancer-previously noted to have prolonged QTc greater than 500 ms.  This was felt to be related to chemotherapy.  Her chemotherapy was held previously. She continues on Tarceva . Continue palliative care/hospice. Following with oncology  Disposition: Follow-up with Dr. Alda Amas or me in 3-4 months.   Home Medications    Prior to Admission medications   Medication Sig Start Date End Date Taking? Authorizing Provider  acetaminophen  (TYLENOL ) 500 MG tablet Take 1,000 mg by mouth every 8 (eight) hours as needed for mild pain.    [provider]  ALPRAZolam  (XANAX ) 0.5 MG tablet Take 1 tablet (0.5 mg total) by mouth 3 (three) times daily as needed for anxiety. Patient taking differently: Take 0.5 mg by mouth See admin instructions. Take 0.5 mg by mouth in the morning and an additional 0.5 mg once a day as needed for anxiety 12/13/23   Pickenpack-Cousar, Giles Labrum, NP  famotidine  (PEPCID ) 20 MG tablet Take 1 tablet (20 mg total) by mouth at bedtime. Patient not taking: Reported on 02/09/2024 12/13/23   Pickenpack-Cousar, Athena N, NP  HYDROcodone -acetaminophen  (NORCO/VICODIN) 5-325 MG tablet Take 1-2 tablets by mouth every 3 (three) hours as needed. 01/26/24   Samtani, Jai-Gurmukh, MD  loperamide  (IMODIUM ) 2 MG capsule Take 1 capsule (2 mg total) by mouth as needed for diarrhea or loose stools. 10/23/23   Pokhrel, Laxman, MD  metoprolol  succinate (TOPROL  XL) 100 MG 24 hr tablet Take 1 tablet (100 mg total) by mouth in the morning and at bedtime. Take with or immediately following a meal. 01/02/24 01/01/25  Lonita Roach, MD  Simethicone  (GAS-X PO) Take 2 tablets by mouth daily as needed (for gas).    [provider]  temazepam  (RESTORIL ) 30 MG capsule Take 1 capsule (30 mg total) by mouth at bedtime as needed for sleep. Patient taking differently: Take 30 mg by mouth at bedtime. 12/13/23    Pickenpack-Cousar, Giles Labrum, NP  bisoprolol -hydrochlorothiazide  (ZIAC ) 5-6.25 MG tablet Take  1 tablet  Daily  for BP 06/06/22 07/11/22  Vangie Genet, MD    Family History    Family History  Problem Relation Age of Onset   Heart disease Father    Hypertension Father    Diabetes Father    Kidney disease Father    Cancer Brother    She indicated that the status of her father is unknown. She indicated that the status of her brother is unknown.  Social History    Social History   Socioeconomic History   Marital status: Media planner    Spouse name: Not on file   Number of children: Not on file   Years of education: Not on file   Highest education level: Not on file  Occupational History   Not on file  Tobacco Use   Smoking status: Former    Types: E-cigarettes   Smokeless tobacco: Never   Tobacco comments:    Quit in October 2024.  11/09/2023 hfb  Vaping Use   Vaping status: Every Day  Substance and Sexual Activity   Alcohol use: Yes  Alcohol/week: 3.0 standard drinks of alcohol    Types: 3 Standard drinks or equivalent per week   Drug use: Never   Sexual activity: Not on file  Other Topics Concern   Not on file  Social History Narrative   Not on file   Social Drivers of Health   Financial Resource Strain: Not on file  Food Insecurity: No Food Insecurity (01/23/2024)   Hunger Vital Sign    Worried About Running Out of Food in the Last Year: Never true    Ran Out of Food in the Last Year: Never true  Transportation Needs: No Transportation Needs (01/23/2024)   PRAPARE - Administrator, Civil Service (Medical): No    Lack of Transportation (Non-Medical): No  Physical Activity: Not on file  Stress: Not on file  Social Connections: Not on file  Intimate Partner Violence: Not At Risk (01/23/2024)   Humiliation, Afraid, Rape, and Kick questionnaire    Fear of Current or Ex-Partner: No    Emotionally Abused: No    Physically Abused: No    Sexually  Abused: No     Review of Systems    General:  No chills, fever, night sweats or weight changes.  Cardiovascular:  No chest pain, dyspnea on exertion, edema, orthopnea, palpitations, paroxysmal nocturnal dyspnea. Dermatological: No rash, lesions/masses Respiratory: No cough, dyspnea Urologic: No hematuria, dysuria Abdominal:   No nausea, vomiting, diarrhea, bright red blood per rectum, melena, or hematemesis Neurologic:  No visual changes, wkns, changes in mental status. All other systems reviewed and are otherwise negative except as noted above.  Physical Exam    VS:  There were no vitals taken for this visit. , BMI There is no height or weight on file to calculate BMI. GEN: Well nourished, well developed, in no acute distress. HEENT: normal. Neck: Supple, no JVD, carotid bruits, or masses. Cardiac: RRR, no murmurs, rubs, or gallops. No clubbing, cyanosis, edema.  Radials/DP/PT 2+ and equal bilaterally.  Respiratory:  Respirations regular and unlabored, clear to auscultation bilaterally. GI: Soft, nontender, nondistended, BS + x 4. MS: no deformity or atrophy. Skin: warm and dry, no rash. Neuro:  Strength and sensation are intact. Psych: Normal affect.  Accessory Clinical Findings    Recent Labs: 12/26/2023: B Natriuretic Peptide 73.3; TSH 1.211 01/21/2024: Magnesium  3.1 01/24/2024: ALT 17 01/26/2024: BUN 7; Creatinine, Ser 0.67; Hemoglobin 8.3; Platelets 203; Potassium 3.3; Sodium 137   Recent Lipid Panel    Component Value Date/Time   CHOL 139 10/18/2023 1243   TRIG 118 09/24/2023 0500   HDL 31 (L) 09/24/2023 0500   CHOLHDL 3.9 09/24/2023 0500   VLDL 24 09/24/2023 0500   LDLCALC 67 09/24/2023 0500   LDLCALC 90 10/12/2022 1442    No BP recorded.  {Refresh Note OR Click here to enter BP  :1}***    ECG personally reviewed by me today- ***     Echocardiogram 01/09/2024  IMPRESSIONS     1. Limited Study.   2. The left ventricle demonstrates global hypokinesis with  regional wall  motion wall motion involving inferoseptal, inferior, and anteroseptal wall  (mostly base to mid segments). . Left ventricular ejection fraction, by  estimation, is 35 to 40%. The  left ventricle has moderately decreased function.   3. Right ventricular systolic function grossly normal. The right  ventricular size is grossly normal.   4. No obvious findings to suggest tamponade physiology as IVC is not  dialated and can be compressed. Accurary is limited  due to no M-modes  across the right atrium and right ventricle, no repiratory variation data  across the MV and TV. If clinically  suspicion is high consider repeat limited echo in 24-48hrs (clinical  correlation is required). A small pericardial effusion is present. The  pericardial effusion is anterior to the right ventricle and surrounding  the apex. The pericardial effusion/space  appears to contain mixed echogenic material (clinical correlation is  required) see image 4.   5. The mitral valve is grossly normal.   6. The aortic valve is grossly normal. Aortic valve sclerosis is present.   7. The inferior vena cava is normal in size with greater than 50%  respiratory variability, suggesting right atrial pressure of 3 mmHg.   FINDINGS   Left Ventricle: The left ventricle demonstrates global hypokinesis with  regional wall motion wall motion involving inferoseptal, inferior, and  anteroseptal wall (mostly base to mid segments). Left ventricular ejection  fraction, by estimation, is 35 to  40%. The left ventricle has moderately decreased function. 3D ejection  fraction reviewed and evaluated as part of the interpretation. Alternate  measurement of EF is felt to be most reflective of LV function. There is  no left ventricular hypertrophy.   Right Ventricle: The right ventricular size is grossly normal. Right  ventricular systolic function grossly normal.   Pericardium: No obvious findings to suggest tamponade  physiology as IVC is  not dialated and can be compressed. Accurary is limited due to no M-modes  across the right atrium and right ventricle, no repiratory variation data  across the MV and TV. If  clinically suspicion is high consider repeat limited echo in 24-48hrs  (clinical correlation is required). A small pericardial effusion is  present. The pericardial effusion is anterior to the right ventricle and  surrounding the apex. The pericardial  effusion/space appears to contain mixed echogenic material (clinical  correlation is required) see image 4.     Mitral Valve: The mitral valve is grossly normal. Mild mitral annular  calcification.   Aortic Valve: The aortic valve is grossly normal. Aortic valve sclerosis  is present.   Venous: The inferior vena cava is normal in size with greater than 50%  respiratory variability, suggesting right atrial pressure of 3 mmHg.       Assessment & Plan   1.  ***   Chet Cota. Lorrin Nawrot NP-C     03/04/2024, 4:51 PM [facility] [address] Office [phone] Fax [phone]    I spent***minutes examining this patient, reviewing medications, and using patient centered shared decision making involving their cardiac care.   I spent  20 minutes reviewing past medical history,  medications, and prior cardiac tests.

## 2024-03-06 ENCOUNTER — Ambulatory Visit: Payer: No Typology Code available for payment source | Attending: General Practice | Admitting: General Practice

## 2024-03-31 DEATH — deceased

## 2024-10-16 ENCOUNTER — Encounter: Payer: No Typology Code available for payment source | Admitting: Nurse Practitioner
